# Patient Record
Sex: Female | Born: 1940 | Race: White | Hispanic: No | Marital: Married | Smoking: Never smoker
Health system: Southern US, Community
[De-identification: ages and names within clinical notes are randomized; demographics above are authoritative.]

## PROBLEM LIST (undated history)

## (undated) DIAGNOSIS — IMO0002 Reserved for concepts with insufficient information to code with codable children: Secondary | ICD-10-CM

## (undated) DIAGNOSIS — F419 Anxiety disorder, unspecified: Secondary | ICD-10-CM

## (undated) DIAGNOSIS — Z9221 Personal history of antineoplastic chemotherapy: Secondary | ICD-10-CM

## (undated) DIAGNOSIS — E785 Hyperlipidemia, unspecified: Secondary | ICD-10-CM

## (undated) DIAGNOSIS — R011 Cardiac murmur, unspecified: Secondary | ICD-10-CM

## (undated) DIAGNOSIS — E079 Disorder of thyroid, unspecified: Secondary | ICD-10-CM

## (undated) DIAGNOSIS — Z800 Family history of malignant neoplasm of digestive organs: Secondary | ICD-10-CM

## (undated) DIAGNOSIS — C801 Malignant (primary) neoplasm, unspecified: Secondary | ICD-10-CM

## (undated) DIAGNOSIS — N952 Postmenopausal atrophic vaginitis: Secondary | ICD-10-CM

## (undated) DIAGNOSIS — N390 Urinary tract infection, site not specified: Secondary | ICD-10-CM

## (undated) DIAGNOSIS — M1990 Unspecified osteoarthritis, unspecified site: Secondary | ICD-10-CM

## (undated) DIAGNOSIS — G4730 Sleep apnea, unspecified: Secondary | ICD-10-CM

## (undated) DIAGNOSIS — Z923 Personal history of irradiation: Secondary | ICD-10-CM

## (undated) DIAGNOSIS — I00 Rheumatic fever without heart involvement: Secondary | ICD-10-CM

## (undated) DIAGNOSIS — I10 Essential (primary) hypertension: Secondary | ICD-10-CM

## (undated) DIAGNOSIS — K219 Gastro-esophageal reflux disease without esophagitis: Secondary | ICD-10-CM

## (undated) DIAGNOSIS — Z803 Family history of malignant neoplasm of breast: Secondary | ICD-10-CM

## (undated) DIAGNOSIS — K648 Other hemorrhoids: Secondary | ICD-10-CM

## (undated) DIAGNOSIS — Z87442 Personal history of urinary calculi: Secondary | ICD-10-CM

## (undated) DIAGNOSIS — K5790 Diverticulosis of intestine, part unspecified, without perforation or abscess without bleeding: Secondary | ICD-10-CM

## (undated) DIAGNOSIS — M8580 Other specified disorders of bone density and structure, unspecified site: Secondary | ICD-10-CM

## (undated) DIAGNOSIS — D649 Anemia, unspecified: Secondary | ICD-10-CM

## (undated) DIAGNOSIS — R51 Headache: Secondary | ICD-10-CM

## (undated) DIAGNOSIS — E039 Hypothyroidism, unspecified: Secondary | ICD-10-CM

## (undated) HISTORY — PX: PELVIC LAPAROSCOPY: SHX162

## (undated) HISTORY — PX: OTHER SURGICAL HISTORY: SHX169

## (undated) HISTORY — DX: Diverticulosis of intestine, part unspecified, without perforation or abscess without bleeding: K57.90

## (undated) HISTORY — DX: Urinary tract infection, site not specified: N39.0

## (undated) HISTORY — DX: Gastro-esophageal reflux disease without esophagitis: K21.9

## (undated) HISTORY — PX: APPENDECTOMY: SHX54

## (undated) HISTORY — DX: Essential (primary) hypertension: I10

## (undated) HISTORY — DX: Reserved for concepts with insufficient information to code with codable children: IMO0002

## (undated) HISTORY — DX: Rheumatic fever without heart involvement: I00

## (undated) HISTORY — DX: Anemia, unspecified: D64.9

## (undated) HISTORY — DX: Postmenopausal atrophic vaginitis: N95.2

## (undated) HISTORY — DX: Disorder of thyroid, unspecified: E07.9

## (undated) HISTORY — DX: Headache: R51

## (undated) HISTORY — DX: Family history of malignant neoplasm of digestive organs: Z80.0

## (undated) HISTORY — DX: Other hemorrhoids: K64.8

## (undated) HISTORY — PX: ESOPHAGOGASTRODUODENOSCOPY ENDOSCOPY: SHX5814

## (undated) HISTORY — DX: Family history of malignant neoplasm of breast: Z80.3

## (undated) HISTORY — PX: TONSILLECTOMY: SUR1361

## (undated) HISTORY — DX: Hyperlipidemia, unspecified: E78.5

## (undated) HISTORY — PX: COLONOSCOPY: SHX174

## (undated) HISTORY — DX: Other specified disorders of bone density and structure, unspecified site: M85.80

---

## 1976-04-16 HISTORY — PX: LAPAROSCOPIC ENDOMETRIOSIS FULGURATION: SUR769

## 1999-07-25 ENCOUNTER — Encounter: Payer: Self-pay | Admitting: Emergency Medicine

## 1999-07-25 ENCOUNTER — Emergency Department (HOSPITAL_COMMUNITY): Admission: EM | Admit: 1999-07-25 | Discharge: 1999-07-26 | Payer: Self-pay | Admitting: Emergency Medicine

## 2000-03-14 ENCOUNTER — Other Ambulatory Visit: Admission: RE | Admit: 2000-03-14 | Discharge: 2000-03-14 | Payer: Self-pay | Admitting: *Deleted

## 2000-08-20 ENCOUNTER — Ambulatory Visit (HOSPITAL_COMMUNITY): Admission: RE | Admit: 2000-08-20 | Discharge: 2000-08-20 | Payer: Self-pay | Admitting: Gastroenterology

## 2001-03-17 ENCOUNTER — Other Ambulatory Visit: Admission: RE | Admit: 2001-03-17 | Discharge: 2001-03-17 | Payer: Self-pay | Admitting: *Deleted

## 2003-02-16 ENCOUNTER — Encounter (INDEPENDENT_AMBULATORY_CARE_PROVIDER_SITE_OTHER): Payer: Self-pay

## 2003-02-16 ENCOUNTER — Ambulatory Visit (HOSPITAL_COMMUNITY): Admission: RE | Admit: 2003-02-16 | Discharge: 2003-02-16 | Payer: Self-pay | Admitting: Family Medicine

## 2004-06-17 ENCOUNTER — Emergency Department (HOSPITAL_COMMUNITY): Admission: EM | Admit: 2004-06-17 | Discharge: 2004-06-18 | Payer: Self-pay | Admitting: Emergency Medicine

## 2004-06-21 ENCOUNTER — Encounter: Payer: Self-pay | Admitting: Family Medicine

## 2004-06-21 ENCOUNTER — Inpatient Hospital Stay (HOSPITAL_COMMUNITY): Admission: EM | Admit: 2004-06-21 | Discharge: 2004-06-23 | Payer: Self-pay | Admitting: Emergency Medicine

## 2004-06-21 ENCOUNTER — Ambulatory Visit: Payer: Self-pay | Admitting: Internal Medicine

## 2004-06-22 ENCOUNTER — Encounter: Payer: Self-pay | Admitting: Internal Medicine

## 2004-06-30 ENCOUNTER — Ambulatory Visit: Payer: Self-pay

## 2004-07-10 ENCOUNTER — Ambulatory Visit: Payer: Self-pay | Admitting: Internal Medicine

## 2007-02-05 ENCOUNTER — Encounter: Payer: Self-pay | Admitting: Family Medicine

## 2007-02-05 ENCOUNTER — Encounter: Admission: RE | Admit: 2007-02-05 | Discharge: 2007-02-05 | Payer: Self-pay | Admitting: Family Medicine

## 2007-04-17 LAB — HM COLONOSCOPY: HM Colonoscopy: NORMAL

## 2007-12-09 ENCOUNTER — Other Ambulatory Visit: Admission: RE | Admit: 2007-12-09 | Discharge: 2007-12-09 | Payer: Self-pay | Admitting: Obstetrics and Gynecology

## 2007-12-30 ENCOUNTER — Ambulatory Visit: Payer: Self-pay | Admitting: Obstetrics and Gynecology

## 2008-01-12 ENCOUNTER — Ambulatory Visit: Payer: Self-pay | Admitting: Obstetrics and Gynecology

## 2008-01-15 LAB — HM MAMMOGRAPHY: HM Mammogram: NORMAL

## 2008-01-22 ENCOUNTER — Ambulatory Visit (HOSPITAL_COMMUNITY): Admission: RE | Admit: 2008-01-22 | Discharge: 2008-01-22 | Payer: Self-pay | Admitting: Obstetrics and Gynecology

## 2008-03-18 ENCOUNTER — Encounter: Admission: RE | Admit: 2008-03-18 | Discharge: 2008-03-18 | Payer: Self-pay | Admitting: Obstetrics and Gynecology

## 2008-04-16 HISTORY — PX: ULNAR NERVE REPAIR: SHX2594

## 2008-05-13 ENCOUNTER — Ambulatory Visit (HOSPITAL_BASED_OUTPATIENT_CLINIC_OR_DEPARTMENT_OTHER): Admission: RE | Admit: 2008-05-13 | Discharge: 2008-05-13 | Payer: Self-pay | Admitting: Orthopedic Surgery

## 2008-05-27 ENCOUNTER — Encounter: Payer: Self-pay | Admitting: Family Medicine

## 2008-07-30 ENCOUNTER — Ambulatory Visit: Payer: Self-pay | Admitting: Family Medicine

## 2008-07-30 DIAGNOSIS — G43909 Migraine, unspecified, not intractable, without status migrainosus: Secondary | ICD-10-CM | POA: Insufficient documentation

## 2008-07-30 DIAGNOSIS — R51 Headache: Secondary | ICD-10-CM | POA: Insufficient documentation

## 2008-07-30 DIAGNOSIS — K219 Gastro-esophageal reflux disease without esophagitis: Secondary | ICD-10-CM | POA: Insufficient documentation

## 2008-07-30 DIAGNOSIS — E785 Hyperlipidemia, unspecified: Secondary | ICD-10-CM | POA: Insufficient documentation

## 2008-07-30 DIAGNOSIS — M76899 Other specified enthesopathies of unspecified lower limb, excluding foot: Secondary | ICD-10-CM | POA: Insufficient documentation

## 2008-07-30 DIAGNOSIS — R519 Headache, unspecified: Secondary | ICD-10-CM | POA: Insufficient documentation

## 2008-09-09 ENCOUNTER — Encounter: Payer: Self-pay | Admitting: Family Medicine

## 2008-09-09 DIAGNOSIS — M899 Disorder of bone, unspecified: Secondary | ICD-10-CM | POA: Insufficient documentation

## 2008-09-09 DIAGNOSIS — M949 Disorder of cartilage, unspecified: Secondary | ICD-10-CM

## 2008-09-09 DIAGNOSIS — M4120 Other idiopathic scoliosis, site unspecified: Secondary | ICD-10-CM | POA: Insufficient documentation

## 2009-04-25 ENCOUNTER — Ambulatory Visit: Payer: Self-pay | Admitting: Obstetrics and Gynecology

## 2009-04-25 ENCOUNTER — Other Ambulatory Visit: Admission: RE | Admit: 2009-04-25 | Discharge: 2009-04-25 | Payer: Self-pay | Admitting: Obstetrics and Gynecology

## 2009-05-11 ENCOUNTER — Encounter (HOSPITAL_COMMUNITY): Admission: RE | Admit: 2009-05-11 | Discharge: 2009-07-25 | Payer: Self-pay | Admitting: Obstetrics and Gynecology

## 2009-06-06 ENCOUNTER — Ambulatory Visit: Payer: Self-pay | Admitting: Family Medicine

## 2009-06-06 DIAGNOSIS — R5383 Other fatigue: Secondary | ICD-10-CM

## 2009-06-06 DIAGNOSIS — R030 Elevated blood-pressure reading, without diagnosis of hypertension: Secondary | ICD-10-CM | POA: Insufficient documentation

## 2009-06-06 DIAGNOSIS — E039 Hypothyroidism, unspecified: Secondary | ICD-10-CM | POA: Insufficient documentation

## 2009-06-06 DIAGNOSIS — R5381 Other malaise: Secondary | ICD-10-CM | POA: Insufficient documentation

## 2009-06-06 LAB — CONVERTED CEMR LAB
Cholesterol, target level: 200 mg/dL
HDL goal, serum: 40 mg/dL
LDL Goal: 160 mg/dL

## 2009-06-08 LAB — CONVERTED CEMR LAB
ALT: 22 units/L (ref 0–35)
AST: 27 units/L (ref 0–37)
Albumin: 4.3 g/dL (ref 3.5–5.2)
Alkaline Phosphatase: 71 units/L (ref 39–117)
BUN: 19 mg/dL (ref 6–23)
Basophils Absolute: 0 10*3/uL (ref 0.0–0.1)
Basophils Relative: 0.6 % (ref 0.0–3.0)
Bilirubin, Direct: 0.1 mg/dL (ref 0.0–0.3)
CO2: 31 meq/L (ref 19–32)
Calcium: 9.1 mg/dL (ref 8.4–10.5)
Chloride: 107 meq/L (ref 96–112)
Cholesterol: 237 mg/dL — ABNORMAL HIGH (ref 0–200)
Creatinine, Ser: 1.1 mg/dL (ref 0.4–1.2)
Direct LDL: 119.8 mg/dL
Eosinophils Absolute: 0.3 10*3/uL (ref 0.0–0.7)
Eosinophils Relative: 4.7 % (ref 0.0–5.0)
GFR calc non Af Amer: 52.33 mL/min (ref >60)
Glucose, Bld: 99 mg/dL (ref 70–99)
HCT: 40.8 % (ref 36.0–46.0)
HDL: 65.5 mg/dL (ref >39.00)
Hemoglobin: 13.5 g/dL (ref 12.0–15.0)
Lymphocytes Relative: 32.6 % (ref 12.0–46.0)
Lymphs Abs: 2.2 10*3/uL (ref 0.7–4.0)
MCHC: 33.2 g/dL (ref 30.0–36.0)
MCV: 86 fL (ref 78.0–100.0)
Monocytes Absolute: 0.7 10*3/uL (ref 0.1–1.0)
Monocytes Relative: 10.5 % (ref 3.0–12.0)
Neutro Abs: 3.7 10*3/uL (ref 1.4–7.7)
Neutrophils Relative %: 51.6 % (ref 43.0–77.0)
Platelets: 282 10*3/uL (ref 150.0–400.0)
Potassium: 5 meq/L (ref 3.5–5.1)
RBC: 4.75 M/uL (ref 3.87–5.11)
RDW: 12.9 % (ref 11.5–14.6)
Sodium: 144 meq/L (ref 135–145)
TSH: 0.78 microintl units/mL (ref 0.35–5.50)
Total Bilirubin: 0.7 mg/dL (ref 0.3–1.2)
Total CHOL/HDL Ratio: 4
Total Protein: 7.5 g/dL (ref 6.0–8.3)
Triglycerides: 443 mg/dL — ABNORMAL HIGH (ref 0.0–149.0)
VLDL: 88.6 mg/dL — ABNORMAL HIGH (ref 0.0–40.0)
WBC: 6.9 10*3/uL (ref 4.5–10.5)

## 2009-07-20 ENCOUNTER — Telehealth: Payer: Self-pay | Admitting: Family Medicine

## 2009-08-16 ENCOUNTER — Telehealth: Payer: Self-pay | Admitting: Family Medicine

## 2010-02-17 ENCOUNTER — Telehealth: Payer: Self-pay | Admitting: Family Medicine

## 2010-05-18 NOTE — Progress Notes (Signed)
Summary: fever blisters  Phone Note Call from Patient   Caller: Patient Call For: Evelena Peat MD Summary of Call: Pt is having fever blisters and would like [facility]. Initial call taken by: Lynann Beaver CMA,  July 20, 2009 4:30 PM  Follow-up for Phone Call        Valtrex 1 gm 2 tablets at onset of fever blister and repeat 2 tablets 12 hours later, disp #30 with one refill. Follow-up by: Evelena Peat MD,  July 20, 2009 4:37 PM    New/Updated Medications: VALTREX 1 GM TABS (VALACYCLOVIR HCL) 2 tabs at onset of fever blisters and then repeat 12 hours later Prescriptions: VALTREX 1 GM TABS (VALACYCLOVIR HCL) 2 tabs at onset of fever blisters and then repeat 12 hours later  #20 x 1   Entered by:   Lynann Beaver CMA   Authorized by:   Evelena Peat MD   Signed by:   Lynann Beaver CMA on 07/20/2009   Method used:   Electronically to        [facility] ([facility])       [address]       [place], [place]  [address]       Ph: [phone]       Fax: [phone]   RxID:   [id number]

## 2010-05-18 NOTE — Progress Notes (Signed)
Summary: dental premed?  Phone Note Call from Patient Call back at [facility] Phone [phone] Call back at [facility]   Summary of Call: Had rheumatic fever as a child.  Told to take something like penicillin before dental work.  For dental procedure.  Do I need premed?  Have some amox 4  1hr before on hand, from another physician,  if needed.  Please advise. Initial call taken by: Rudy Jew, RN,  Aug 16, 2009 11:12 AM  Follow-up for Phone Call        Current AHA guidelines do not rec routine dental prophylaxis in this case. Follow-up by: Evelena Peat MD,  Aug 16, 2009 1:13 PM  Additional Follow-up for Phone Call Additional follow up Details #1::        Phone Call Completed Additional Follow-up by: Rudy Jew, RN,  Aug 16, 2009 2:06 PM

## 2010-05-18 NOTE — Progress Notes (Signed)
Summary: RX for nerves  Phone Note Call from Patient Call back at Home Phone [phone]   Caller: Patient Call For: Evelena Peat MD Summary of Call: Leaving for [place], and wants to know if Dr. Caryl Never would give her something for the flight over and back.   Initial call taken by: Lynann Beaver CMA AAMA,  February 17, 2010 11:26 AM  Follow-up for Phone Call        Yes.  Lorazepam 0.5 mg 1-2 by mouth q6 hours as needed anxiety.  Disp #15 with no refill.  Take one hour prior to flight. Follow-up by: Evelena Peat MD,  February 17, 2010 12:53 PM  Additional Follow-up for Phone Call Additional follow up Details #1::        LMTCB Additional Follow-up by: [facility] CMA AAMA,  February 17, 2010 1:10 PM    New/Updated Medications: LORAZEPAM 0.5 MG TABS (LORAZEPAM) 1-2 q 6 hours as needed for anxiety Prescriptions: LORAZEPAM 0.5 MG TABS (LORAZEPAM) 1-2 q 6 hours as needed for anxiety  #15 x 0   Entered by:   Lynann Beaver CMA AAMA   Authorized by:   Stacie Glaze MD   Signed by:   Lynann Beaver CMA AAMA on 02/17/2010   Method used:   Telephoned to ...       [facility]       [address]       [place], [place]  [address]       Ph: [phone]       Fax: [phone]   RxID:   [id number]

## 2010-05-18 NOTE — Assessment & Plan Note (Signed)
Summary: med check/refill/cjr   Vital Signs:  Patient profile:   70 year old female Weight:      138 pounds BMI:     26.17 Temp:     98.4 degrees F oral BP sitting:   120 / 80  (left arm) Cuff size:   regular  Vitals Entered By: Sid Falcon LPN (June 06, 2009 11:10 AM)  Nutrition Counseling: Patient's BMI is greater than 25 and therefore counseled on weight management options. CC: med check, refill, Lipid Management   History of Present Illness: Follow up medical problems.  Hypothyroidism treated with levothyroxine 50 micrograms daily. Compliant with therapy. Has had some recent fatigue issues. Needs lab work.  Hyperlipidemia treated with simvastatin. No myalgia or other side effect. Compliant with therapy. No history of CAD.  History elevated blood pressure treated with hydrochlorothiazide. Blood pressure stable. No orthostatic symptoms.  Assessment progressive fatigue over recent months. Generally sleeping well. Good appetite. Weight stable. Otherwise feels well  Lipid Management History:      Positive NCEP/ATP III risk factors include female age 26 years old or older.  Negative NCEP/ATP III risk factors include non-diabetic, no family history for ischemic heart disease, non-tobacco-user status, no ASHD (atherosclerotic heart disease), no prior stroke/TIA, no peripheral vascular disease, and no history of aortic aneurysm.     Allergies: 1)  ! Macrobid (Nitrofurantoin Monohyd Macro) 2)  ! Bactrim Ds (Sulfamethoxazole-Trimethoprim)  Past History:  Past Medical History: Last updated: 09/09/2008 Anemia GERD Headache/migraines Ulcers Kidney stones rheumatic fever age 27 thyroid problem UTI ulnar neuropathy Osteopenia Hyperlipidemia  Past Surgical History: Last updated: 07/30/2008 Endometriosis 1978 Ulna nerve 2010  Family History: Last updated: 09/09/2008 Family History of Arthritis Colon cancer Breast cancer mother, grandmother heart  disease--father Family History Hypertension--parent  Social History: Last updated: 09/09/2008 Retired Runner, broadcasting/film/video Married Never Smoked Alcohol use-no  Risk Factors: Smoking Status: never (07/30/2008) PMH-FH-SH reviewed for relevance  Review of Systems  The patient denies anorexia, fever, weight loss, weight gain, chest pain, syncope, dyspnea on exertion, peripheral edema, prolonged cough, headaches, hemoptysis, abdominal pain, melena, hematochezia, severe indigestion/heartburn, and suspicious skin lesions.    Physical Exam  General:  Well-developed,well-nourished,in no acute distress; alert,appropriate and cooperative throughout examination Ears:  External ear exam shows no significant lesions or deformities.  Otoscopic examination reveals clear canals, tympanic membranes are intact bilaterally without bulging, retraction, inflammation or discharge. Hearing is grossly normal bilaterally. Mouth:  Oral mucosa and oropharynx without lesions or exudates.  Teeth in good repair. Neck:  No deformities, masses, or tenderness noted. Lungs:  Normal respiratory effort, chest expands symmetrically. Lungs are clear to auscultation, no crackles or wheezes. Heart:  Normal rate and regular rhythm. S1 and S2 normal without gallop, murmur, click, rub or other extra sounds. Extremities:  No clubbing, cyanosis, edema, or deformity noted with normal full range of motion of all joints.     Impression & Recommendations:  Problem # 1:  HYPOTHYROIDISM (ICD-244.9)  Her updated medication list for this problem includes:    Synthroid 50 Mcg Tabs (Levothyroxine sodium) ..... Once daily  Orders: TLB-TSH (Thyroid Stimulating Hormone) (84443-TSH) Venipuncture (24401)  Problem # 2:  HYPERLIPIDEMIA (ICD-272.4)  Her updated medication list for this problem includes:    Simvastatin 20 Mg Tabs (Simvastatin) ..... Once daily  Orders: TLB-Lipid Panel (80061-LIPID) TLB-Hepatic/Liver Function Pnl  (80076-HEPATIC) Venipuncture (02725)  Problem # 3:  ELEVATED BLOOD PRESSURE (ICD-796.2)  Her updated medication list for this problem includes:    Atenolol 50 Mg Tabs (Atenolol) [facility]  Once daily    Hydrochlorothiazide 25 Mg Tabs (Hydrochlorothiazide) ..... Once daily  Orders: TLB-BMP (Basic Metabolic Panel-BMET) (80048-METABOL) Venipuncture (78295)  Problem # 4:  FATIGUE (ICD-780.79)  Orders: TLB-CBC Platelet - w/Differential (85025-CBCD) TLB-BMP (Basic Metabolic Panel-BMET) (80048-METABOL) Venipuncture (62130)  Complete Medication List: 1)  Atenolol 50 Mg Tabs (Atenolol) .... Once daily 2)  Zolpidem Tartrate 10 Mg Tabs (Zolpidem tartrate) .... Once daily 3)  Synthroid 50 Mcg Tabs (Levothyroxine sodium) .... Once daily 4)  Effexor Xr 150 Mg Xr24h-cap (Venlafaxine hcl) .... Once daily 5)  Simvastatin 20 Mg Tabs (Simvastatin) .... Once daily 6)  Hydrochlorothiazide 25 Mg Tabs (Hydrochlorothiazide) .... Once daily 7)  Vitamin D3 2000 Unit Caps (Cholecalciferol) .... One daily 8)  Reclast 5 Mg/110ml Soln (Zoledronic acid) .... Once yearly  Lipid Assessment/Plan:      Based on NCEP/ATP III, the patient's risk factor category is "0-1 risk factors".  The patient's lipid goals are as follows: Total cholesterol goal is 200; LDL cholesterol goal is 160; HDL cholesterol goal is 40; Triglyceride goal is 150.    Patient Instructions: 1)  Please schedule a follow-up appointment in 1 year.  Prescriptions: HYDROCHLOROTHIAZIDE 25 MG TABS (HYDROCHLOROTHIAZIDE) once daily  #30 x 11   Entered and Authorized by:   Evelena Peat MD   Signed by:   Evelena Peat MD on 06/06/2009   Method used:   Electronically to        [facility]       [address]       [place], [place]  [address]       Ph: [phone]       Fax: [phone]   RxID:   [id number] SIMVASTATIN 20 MG TABS (SIMVASTATIN) once daily  #30 x 11   Entered and Authorized by:    Evelena Peat MD   Signed by:   Evelena Peat MD on 06/06/2009   Method used:   Electronically to        [facility]       [address]       [place], [place]  [address]       Ph: [phone]       Fax: [phone]   RxID:   [id number] SYNTHROID 50 MCG TABS (LEVOTHYROXINE SODIUM) once daily  #30 x 11   Entered and Authorized by:   Evelena Peat MD   Signed by:   Evelena Peat MD on 06/06/2009   Method used:   Electronically to        [facility])       [address]       [place], [place]  [address]       Ph: [phone]       Fax: [phone]   RxID:   [id number]

## 2010-05-18 NOTE — Assessment & Plan Note (Signed)
Summary: fup on meds-leg pain//ccm   Vital Signs:  Patient profile:   70 year old female Height:      61 inches Weight:      147 pounds BMI:     27.88 Temp:     98.2 degrees F oral BP sitting:   140 / 70  (left arm) Cuff size:   regular  Vitals Entered By: Sid Falcon LPN (July 30, 2008 11:28 AM) CC: Previous [place] pt, to establish, left leg/groin pain, F/U on meds   History of Present Illness: Patient is a pleasant 70 year old female seen to establish care. She is seen today with chief complaint of left anterior thigh and hip pain in the past 10 days or so. No known injury. She describes a pain which was initially moderate in severity and worse with walking but not at rest. This is a achy pain confined to the anterior hip area without radiation. She did Tylenol without relief. She states her pain is about 50% better today. No edema. Denies any fever or chills. No associated back pain. She has no history of osteoporosis and is on reclast.  Other medical problems include history of GERD, peptic ulcer disease, past history of anemia, migraine headaches, kidney stones, hypothyroidism, and hyperlipidemia. We have no old records at this time. She had left ulnar neuropathy diagnosed in the past year and had recent surgery for that and is recovering.  Preventive Screening-Counseling & Management     Smoking Status: never  Past History:  Family History:    Family History of Arthritis    Colon cancer    Breast cancer mother, grandmother    heart disease    Family History Hypertension     (07/30/2008)  Social History:    Retired Runner, broadcasting/film/video    Married     (07/30/2008)  Risk Factors:    Alcohol Use: N/A    >5 drinks/d w/in last 3 months: N/A    Caffeine Use: N/A    Diet: N/A    Exercise: N/A  Risk Factors:    Smoking Status: N/A    Packs/Day: N/A    Cigars/wk: N/A    Pipe Use/wk: N/A    Cans of tobacco/wk: N/A    Passive Smoke Exposure: N/A  Past Medical History:  Anemia    GERD    Headache/migraines    Ulcers    Kidney stones    rheumatic fever age 39    thyroid problem    UTI    ulnar neuropathy  Past Surgical History:    Endometriosis 1978    Ulna nerve 2010  Family History:    Family History of Arthritis    Colon cancer    Breast cancer mother, grandmother    heart disease    Family History Hypertension  Social History:    Retired Runner, broadcasting/film/video    Married    Never Smoked    Smoking Status:  never  Review of Systems  The patient denies anorexia, fever, weight loss, peripheral edema, abdominal pain, muscle weakness, and difficulty walking.    Physical Exam  General:  patient's alert healthy-appearing Neck:  No deformities, masses, or tenderness noted. Lungs:  Normal respiratory effort, chest expands symmetrically. Lungs are clear to auscultation, no crackles or wheezes. Heart:  regular rhythm and rate Abdomen:  soft and nontender. No masses. Msk:  No deformity or scoliosis noted of thoracic or lumbar spine.   Pulses:  distal foot pulses are normal bilaterally. Extremities:  full range of  motion right and left hip without difficulty. No reproducible pain. No edema in lower extremities.chest tenderness over the attachment sartorius muscle in the left hip to the anterior superior iliac spine. No lateral hip tenderness. Straight leg raises are negative. Neurologic:  strength full lower extremities. DP reflexes 2+ in lower extremities. 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 Doubt hip joint pathology. This is already 50% improved compared to when this started. Try icing 2-3 times daily for 15-20 minutes and didn't touch of her pains are not further improved in next 2-3 weeks.  Problem # 2:  GERD (ICD-530.81) stable  Problem # 3:  HYPERLIPIDEMIA (ICD-272.4) Send for old records. No  concerning symptoms for side effects to medication. Her updated medication list for this problem includes:    Simvastatin 20 Mg Tabs (Simvastatin) ..... Once daily  Problem # 4:  MIGRAINE HEADACHE (ICD-346.90) Followed by neurologist. Her updated medication list for this problem includes:    Atenolol 50 Mg Tabs (Atenolol) ..... Once daily  Complete Medication List: 1)  Atenolol 50 Mg Tabs (Atenolol) .... Once daily 2)  Zolpidem Tartrate 10 Mg Tabs (Zolpidem tartrate) .... Once daily 3)  Synthroid 50 Mcg Tabs (Levothyroxine sodium) .... Once daily 4)  Effexor Xr 150 Mg Xr24h-cap (Venlafaxine hcl) .... Once daily 5)  Simvastatin 20 Mg Tabs (Simvastatin) .... Once daily 6)  Hydrochlorothiazide 25 Mg Tabs (Hydrochlorothiazide) .... Once daily 7)  [facility] High Potency Vitamin D 1000 Unit Tabs (Cholecalciferol) .... Once daily 8)  Reclast 5 Mg/187ml Soln (Zoledronic acid) .... Once yearly  Patient Instructions: 1)  Consider icing to the area of tenderness to 3 times daily for 15-20 minutes. Also continue gentle stretches as instructed. Follow up if your pain persists in the next 3-4 weeks.      Preventive Care Screening  Mammogram:    Date:  01/15/2008    Results:  normal   Colonoscopy:    Date:  04/17/2007    Results:  normal

## 2010-07-03 ENCOUNTER — Other Ambulatory Visit: Payer: Self-pay | Admitting: Family Medicine

## 2010-07-31 LAB — BASIC METABOLIC PANEL
BUN: 12 mg/dL (ref 6–23)
CO2: 24 mEq/L (ref 19–32)
Calcium: 8.7 mg/dL (ref 8.4–10.5)
Chloride: 112 mEq/L (ref 96–112)
Creatinine, Ser: 0.81 mg/dL (ref 0.4–1.2)
GFR calc Af Amer: >60 mL/min (ref >60)
GFR calc non Af Amer: >60 mL/min (ref >60)
Glucose, Bld: 93 mg/dL (ref 70–99)
Potassium: 3.9 mEq/L (ref 3.5–5.1)
Sodium: 141 mEq/L (ref 135–145)

## 2010-07-31 LAB — POCT HEMOGLOBIN-HEMACUE: Hemoglobin: 13.9 g/dL (ref 12.0–15.0)

## 2010-08-21 ENCOUNTER — Other Ambulatory Visit: Payer: Self-pay | Admitting: Family Medicine

## 2010-08-29 NOTE — Op Note (Signed)
[name], [name]              ACCOUNT NO.:  [id number]   MEDICAL RECORD NO.:  [id number]          PATIENT TYPE:  AMB   LOCATION:  [facility]SC                          FACILITY:  [facility]   PHYSICIAN:  Katy Fitch. Sypher, M.D. 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   POSTOPERATIVE DIAGNOSIS:  Compression of left ulnar nerve due to arcuate  ligament and Osborne's band, left cubital tunnel.   OPERATIONS:  In situ decompression of what appeared to be stable left  ulnar nerve.   OPERATIONS:  Katy Fitch. Sypher, MD   ASSISTANT:  [name], [name]   ANESTHESIA:  General by LMA.   SUPERVISING ANESTHESIOLOGIST:  Bedelia Person, MD   INDICATIONS:  [name] is a 70 year old woman referred for  evaluation and management of a chronically numb left ring and small  finger as well as weakness of pinch and grasp.  She has had a several-  month history of diminished sensibility, difficulty crossing her  fingers, and weak pinch.  Clinical examination revealed a positive  Froment sign with attempted pinch, a positive Wartenberg sign, and  inability to cross her fingers.  She had diminished sensibility on the  dorsal surface of her small finger metacarpal and loss of sensibility in  the proper ulnar distribution of her left hand.  She had a positive  elbow hyperflexion test and reproducing increased numbness in her hand  and a positive Tinel sign over the ulnar nerve at the level of the  arcuate ligament.  She did not show distal signs of compression.   Dr. [name] performed detailed electrodiagnostic studies localizing a  severe drop in conduction velocity and amplitude across the elbow.  He  concluded that she had a severe ulnar neuropathy localized the  elbow.   After informed consent, [name] was brought to the operating room  at this time.   We advised her preoperatively that we would decompress the nerve in the  region of the cubital tunnel, explore the cubital tunnel, and make  appropriate adjustments in the anatomy to protect the nerve.  If the  nerve was found to be unstable with a shallow groove and a tendency for  chronic subluxation with elbow flexion, we would proceed with an  anterior subcutaneous transposition.   After informed consent, she is brought to the operating room at this  time.  Preoperatively. she and her husband were advised that it can  require up to 18 months to see if severe ulnar nerve stabilize and  recover.  Questions were invited and answered in detail.   PROCEDURE:  [name] was brought to the operating room and placed  in the supine position upon the operating table.   Following the induction of general anesthesia by LMA technique, the left  arm was prepped with Betadine soap and solution, sterilely draped.  Anticipating a short procedure, prophylactic antibiotics were not  provided.  Following exsanguination of the left arm with an Esmarch  bandage, arterial tourniquet on proximal brachium was inflated to 220  mmHg.  Procedure commenced with a 2-cm incision directly over the path  of the ulnar nerve posterior to the epicondyle.  Subcutaneous tissues  were carefully divided taking care to gently dissect and protect the  posterior branch of the medial antebrachial cutaneous nerve.   The ulnar nerve was identified above the epicondyle and decompressed a  distance of 6 cm deep to the brachial fascia.  Care was taken to  preserve the epineurial vessels.  The arcuate ligament was quite tight  over the nerve and there was a band of fascia compressing the nerve at  the entrance of the cubital tunnel.  The fascia at the head of flexor  carpi ulnaris was released over a distance of 5 cm followed  by teasing  the muscle fibers.  The nerve appeared to be unimpeded deep to the  flexor carpi ulnaris.  The nerve was neurolysed carefully through the  cubital tunnel, releasing all fascial bands.   A Glorious Peach was used to roll the nerve forward.  No masses of the floor of  the canal were noted.   The elbow was ranged from 0-145 degrees' flexion.  The nerve remained in  the cubital groove without significant subluxation.   Care was taken to leave a small wall of the arcuate ligament anteriorly  to prevent roll.   The medial head of the triceps had an accessory muscle bundle that did  cover the nerve with elbow flexion beyond 100 degrees.  This did cause  some extrusion.  The accessory muscle was then resected with scissors  and electrocautery down to the native triceps tendon.  This unroofed the  nerve.  The tourniquet was released with immediate cap refill to the  fingers, thumb and good flow in the epineurial vessels.   The wound was repaired with subcutaneous suture of 4-0 Vicryl and  intradermal 3-0 Prolene with Steri-Strips.  Lidocaine 2% was infiltrated  for postop analgesia.  There were no apparent complications.      Katy Fitch Sypher, M.D.  Electronically Signed     RVS/MEDQ  D:  05/13/2008  T:  05/14/2008  Job:  [id number]   cc:   Teena Irani. Arlyce Dice, M.D.  Evelena Peat, M.D.

## 2010-09-01 NOTE — Discharge Summary (Signed)
NAMEEDRA, RICCARDI              ACCOUNT NO.:  [id number]   MEDICAL RECORD NO.:  [id number]          PATIENT TYPE:  INP   LOCATION:  4703                         FACILITY:  [facility]   PHYSICIAN:  Isidor Holts, M.D.  DATE OF BIRTH:  [birth year]   DATE OF ADMISSION:  06/21/2004  DATE OF DISCHARGE:  06/23/2004                                 DISCHARGE SUMMARY   PHYSICIANS:  Primary care physician:  Maryelizabeth Rowan, M.D.  Cardiologist:  Doylene Canning. Ladona Ridgel, M.D.   DISCHARGE DIAGNOSES:  1.  Palpitations.  Normal echocardiogram, 12-lead electrocardiogram, and      thyroid profile  2.  Microcytic anemia status post transfusion of 2 units packed red blood      cells.  3.  History of anxiety/Attention deficit disorder.  4.  Hypertension.  5.  History of hypothyroidism.  6.  Urinary tract infection.   DISCHARGE MEDICATIONS:  1.  Continue all pre-admission medications with the exception of Adderall.  2.  Nitrofurantoin 100 mg p.o. four times a day for 1 week, per discussion      with Dr. Duanne Guess.   PROCEDURES:  1.  Chest x-ray dated June 21, 2004.  This showed scoliosis but no active      disease, heart and mediastinum normal.  2.  2D Echocardiogram dated June 22, 2004.  This showed overall normal left      ventricular systolic function.  Left ventricular ejection fraction was      55% to 65%.  There was a trileaflet aortic valve and trivial mitral      regurgitation, otherwise no other valvular abnormalities.   CONSULTATIONS:  Doylene Canning. Ladona Ridgel, M.D., cardiologist, [place].   ADMISSION HISTORY:  As in H&P dated June 21, 2004. However, in brief, this  is a 70 year old Caucasian female who has been having dyspnea on exertion  and fatigue for approximately six months.  She presents on this occasion,  with palpitations not associated with chest pain, nausea, ordiaphoresis.  She went to her MD's office where an EKG was done which showed subtle ST  changes in the V3 to V6, compared to previous  electrocardiograms.  She was  referred to the emergency department for further evaluation.  It appears the  patient has a history of ADD and is currently on Adderall.  She also has a  history of chronic headaches and utilizes Excedrin and ibuprofen for this.  The patient was admitted for further evaluation, investigation, and  management.   CLINICAL COURSE:  #1.  PALPITATIONS:  The patient has a history of anxiety and hypothyroidism.  Thyroid profile was found to be within normal limits.  It was felt that the  patient's medications, particularly Adderall, could be contributory. This  was, therefore, held.  Review of EKGs showed PVCs and also there was ST-T  wave abnormality.  Serial cardiac enzymes were within normal limits.  The  patient had no further palpitations during course of hospital stay, on  telemetry monitoring.  With a view  to ruling out possibility of coronary  ischemia, Cardiology consultation was called, which was kindly provided by  Dr.  Lewayne Bunting.  He evaluated the patient and recommended that stress  Cardiolite be carried out, possibly on an outpatient basis.  Additionally he  recommended, should patient have recurrence of palpitations, she may benefit  from a Holter monitor.  This may be arranged by patient's primary MD.  It is  noted that patient`s echocardiogram was normal.   #2.  ANEMIA:  The patient presented with approximately six-month history of  fatigue and shortness of breath with exertion.  Laboratory data showed  hemoglobin 9.7, MCV of 72.6. ie, Microcytic anemia, due to iron deficiency.  The patient was managed with transfusion of 2 units packed red blood cells.  As of June 23, 2004, hemoglobin was 13.4.  The patient will merit GI  workup, and we expect this to be arranged by her primary MD.   #3.  HISTORY OF HYPERTENSION:  The patient remained normotensive during the  course of her hospital stay.   #4.  HYPOTHYROIDISM:  Managed with replacement  treatment.  Thyroid profile  was within normal limits.   #5.  HISTORY OF ANXIETY AND ATTENTION DEFICIT DISORDER:  The patient's mood  remained stable during course of hospital stay.  As previously mentioned, it  is felt that Adderall may have been contributary to patient's  symptomatology.  This has been held, until further review of patient's ADD  medication by primary MD on followup.   #6.  URINARY TRACT INFECTION:  Just before patient was discharged on June 23, 2004, I received a call from her primary MD,  Dr. Duanne Guess, who informed me  that patient's urinalysis done a couple of days ago had grown E. coli  sensitive to nitrofurantoin and resistant to ciprofloxacin.  Consensus was  that patient be treated with a one-week course of Nitrofurantoin 100 mg p.o.  four times a day.  A prescription for this was, therefore, handed to patient  with appropriate explanation.   DISPOSITION:  The patient is discharged in satisfactory condition on June 23, 2004.   PAIN MANAGEMENT:  Not applicable.   ACTIVITY:  As tolerated.  The patient may return to work on July 01, 2004,  unless deemed otherwise by cardiology.   DIET:  Healthy heart diet.   WOUND CARE:  Not applicable.   FOLLOW UP:  The patient is to follow up with cardiology; i.e., Dr. Ladona Ridgel,  at [facility], telephone number [phone] on Friday, June 30, 2004,  at 11:45 a.m. for exercise stress test.  She is subsequently to see Dr.  Ladona Ridgel in his office on July 10, 2004, at 2:45 p.m.  This has been  communicated to patient.  In addition, the patient is to follow up with her  primary care physician, Dr. Maryelizabeth Rowan, in one to two weeks.  She is to  call to  establish an appointment.  It is expected that her primary MD will arrange a  GI consultation with regards to microcytic anemia.  Primary care MD will  also review Adderall use. This has been communicated to patient, and she has verbalized understanding.       CO/MEDQ  D:  06/26/2004  T:  06/26/2004  Job:  [id number]   cc:   Maryelizabeth Rowan, M.D.  Fax: [phone]   Doylene Canning. Ladona Ridgel, M.D.

## 2010-09-01 NOTE — Consult Note (Signed)
[name], [name]              ACCOUNT NO.:  [id number]   MEDICAL RECORD NO.:  [id number]          PATIENT TYPE:  INP   LOCATION:  4703                         FACILITY:  [facility]   PHYSICIAN:  Doylene Canning. Ladona Ridgel, M.D.  DATE OF BIRTH:  Jan 02, 1941   DATE OF CONSULTATION:  06/22/2004  DATE OF DISCHARGE:                                   CONSULTATION   REQUESTING PHYSICIAN:  Dr. Robb Matar   INDICATION FOR CONSULTATION:  Evaluation of palpitations and shortness of  breath.   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  She states that she has  recently recovered from passing a renal stone.  She also has a history of  hypothyroidism and is on Synthroid.  The patient was in her usual state of  health until yesterday.  She notes that she had previously passed a kidney  stone one day before.  She denied fever or chills.  She developed prolonged  palpitations.  These were described as intermittent skipping sensations.  There is very little in the way of chest discomfort associated with these  but she did have dyspnea.  There is no nausea, vomiting, diarrhea, or  constipation.  She denies polyuria or polydipsia, heat or cold intolerance.  She presented to the emergency room for additional evaluation.   PAST MEDICAL HISTORY:  1.  Recent diagnosis of anemia.  This is with low MCV.  2.  She has a history of a goiter in the past.  3.  She has a history of scoliosis.   FAMILY HISTORY:  Notable for both parents having hypertension.  Her mother  had breast cancer.  Her father had coronary disease and a stroke.   SOCIAL HISTORY:  The patient denies tobacco or alcohol abuse.  She is a  Engineer, site.   REVIEW OF SYSTEMS:  As noted in the HPI.  She denies syncope.  She denies  nausea, vomiting, diarrhea, or constipation.  She recently noticed  passing a  urinary stone.  She denies heat or cold intolerance.  The rest of her review  of systems was negative.   PHYSICAL EXAMINATION:  GENERAL:  She is a pleasant, well-appearing 64-year-  old woman in no distress.  VITAL SIGNS:  Blood pressure 115/75, pulse 78 and regular, respirations 18,  temperature 98.  HEENT:  Normocephalic, atraumatic.  Pupils equal and round.  Oropharynx was  moist.  Sclerae were anicteric.  NECK:  No jugular venous distention.  There was no obvious thyromegaly.  The  trachea was midline.  CARDIOVASCULAR:  Regular rate and rhythm with normal S1 and S2.  There were  no murmurs, rubs, or gallops.  LUNGS:  Clear bilaterally to auscultation.  ABDOMEN:  Soft and nontender.  There was no organomegaly.  EXTREMITIES:  No clubbing, cyanosis, edema.  Pulses were 2+ and symmetric.  NEUROLOGIC:  Alert and oriented x3 with cranial nerves II-XII intact.  Conjunctivae was 5/5 and symmetric.   EKG demonstrates sinus rhythm with  nonspecific ST-T wave abnormality.   IMPRESSION:  1.  Palpitations.  2.  Shortness of breath.  3.  Anemia.  4.  Hypothyroidism.  5.  Status post passage of a renal stone.   DISCUSSION:  The patient's symptoms and EKGs are suggestive of PACs,  although she does have very subtle ST-T wave abnormality along with dyspnea.  I would recommend serial cardiac enzymes and an exercise perfusion stress  test.  This would be appropriate either as an inpatient or as an outpatient  if her cardiac enzymes are negative.  If palpitations were to occur then an  outpatient 30 day cardiac monitor would be warranted to rule out occult  atrial fibrillation.      GWT/MEDQ  D:  06/22/2004  T:  06/22/2004  Job:  [id number]   cc:   Teena Irani. Arlyce Dice, M.D.  [id number]  [place]  [place] [address]  Fax: [phone]

## 2010-09-01 NOTE — H&P (Signed)
[name], [name]              ACCOUNT NO.:  [id number]   MEDICAL RECORD NO.:  [id number]          PATIENT TYPE:  INP   LOCATION:  1823                         FACILITY:  [facility]   PHYSICIAN:  Jonna L. Robb Matar, M.D.DATE OF BIRTH:  Aug 01, 1940   DATE OF ADMISSION:  06/21/2004  DATE OF DISCHARGE:                                HISTORY & PHYSICAL   PRIMARY CARE PHYSICIAN:  [name], M.D. at [facility].   CHIEF COMPLAINT:  (History from the patient's husband). Irregular heart  beat.   HISTORY OF PRESENT ILLNESS:  This 70 year old white female has been having  dyspnea on exertion and fatigue probably for about six months. She gets  daily headaches and has been taking usually an Excedrin in the morning and  an Excedrin or an ibuprofen in the afternoon. She has been noticing that her  stomach is bothering her a lot.   Today, around 1 o'clock in the afternoon, she noticed that she was getting a  lot of palpitations and skipped beats, not that her heart was really going  fast and that she was getting a lot of skipped beats or extra beats. At  first, she did not say anything about it. Then she told her husband and they  brought her to the doctor's office where she was found to have subtle ST  depression in V3 to V6 compared to a previous cardiogram. She denies chest  pain, nausea, vomiting, diaphoresis   PAST MEDICAL HISTORY:  1.  Goiter.  2.  Hypothyroidism.  3.  Nephrolithiasis. She just passed a kidney stone yesterday and was told      at that time she had a lot of blood in her urine, although she had not      noticed any.  4.  Hypotension.  5.  Anxiety.  6.  ADD.   FAMILY HISTORY:  Coronary artery disease, CVA, hypertension, breast cancer,  hypercholesterolemia.   SOCIAL HISTORY:  Nonsmoker and nondrinker. Married.   REVIEW OF SYMPTOMS:  Some mild generalized weakness. No visual problems. No  ears, nose, or throat trouble. No intrinsic lung  disease. No previous  history of heart disease. No hematemesis or melena. No history of heartburn  or liver disease. No breast masses. No diabetes. No history of B12 shots.  She was not aware that she was anemic. She had been menopausal for over 10  years. No skin or arthritis problems. Other review of systems were negative.   PHYSICAL EXAMINATION:  VITAL SIGNS:  Temperature 98.6, pulse 83,  respirations 22, blood pressure 150/63. Saturating 99%.  GENERAL:  A well-developed pale white female, slightly anxious. Conjunctivae  are pale. Lids are normal. Pupils are reactive to light. EOMs are full. She  has normal hearing, mucosa and pharynx.  NECK:  Very slight goiter. The left side is a little more prominent than the  right. No carotid bruits or jugular venous distention.  LUNGS:  Respiratory effort was normal. Clear to A&P without wheezing, rales,  rhonchi, or dullness.  HEART:  Regular rate and rhythm. Normal S1 and S2 without murmurs, gallops,  or rubs. There was a rare PAC. There were no bruits.  EXTREMITIES:  No clubbing, cyanosis, or edema.  BREASTS:  No masses, tenderness, or discharge.  ABDOMEN:  Slight epigastric tenderness.  Normal bowel sounds. No  hepatosplenomegaly or hernias.  GENITALIA:  External genitalia is normal. There is no adenopathy.  NEUROLOGICAL:  Muscle strength is 5/5 with full in all four extremities.  Cranial nerves are intact. DTRs are 2+. Toes go down. Alert and oriented.  Normal memory and judgment. Slightly anxious.  SKIN:  No rashes, lesions, nodules, induration.   LABORATORY DATA:  EKG showed an occasional ATC and very slight ST flattening  in V4 through V6 that was not present on a previous cardiogram.   White count is 8.0. Hemoglobin is only 9.7. Potassium 3.2. BUN 5, creatinine  1.0. Liver function tests, magnesium, and cardiac enzymes are negative.  Thyroid is pending as is anemia workup.   IMPRESSION:  1.  Probable angina: Certainly the anemia  could be bringing out a subtle      coronary artery disease or it could just be the response to the anemia.      She will need to get a Cardiolite stress test and I have contacted      [facility].  2.  Anemia:  I will do a workup but the most likely cause is an nonsteroidal      anti-inflammatory gastritis or ulcer. I will put her on proton pump      inhibitors, antiacids, check stool for occult blood, and once she is      cleared from a cardiac standpoint we should contact gastroenterology to      look for sources of gastrointestinal bleeding.  3.  Hypokalemia: This will be replaced.  4.  Hypothyroidism.  5.  Attention deficit disorder.      JLB/MEDQ  D:  06/21/2004  T:  06/21/2004  Job:  [id number]

## 2010-10-10 ENCOUNTER — Telehealth: Payer: Self-pay | Admitting: Family Medicine

## 2010-10-10 NOTE — Telephone Encounter (Signed)
Pt states she called and left message earlier. She wanted to add that her pharmacy is the [facility] on [address].

## 2010-10-10 NOTE — Telephone Encounter (Signed)
Pharmacy added.

## 2010-10-11 ENCOUNTER — Telehealth: Payer: Self-pay | Admitting: *Deleted

## 2010-10-11 ENCOUNTER — Ambulatory Visit (INDEPENDENT_AMBULATORY_CARE_PROVIDER_SITE_OTHER): Payer: Self-pay | Admitting: Family Medicine

## 2010-10-11 ENCOUNTER — Encounter: Payer: Self-pay | Admitting: Family Medicine

## 2010-10-11 DIAGNOSIS — E785 Hyperlipidemia, unspecified: Secondary | ICD-10-CM

## 2010-10-11 DIAGNOSIS — E039 Hypothyroidism, unspecified: Secondary | ICD-10-CM

## 2010-10-11 DIAGNOSIS — R51 Headache: Secondary | ICD-10-CM

## 2010-10-11 DIAGNOSIS — R030 Elevated blood-pressure reading, without diagnosis of hypertension: Secondary | ICD-10-CM

## 2010-10-11 MED ORDER — ATENOLOL 50 MG PO TABS: 50.0000 mg | ORAL_TABLET | Freq: Every day | ORAL | Status: DC

## 2010-10-11 MED ORDER — VENLAFAXINE HCL ER 150 MG PO CP24: 150.0000 mg | ORAL_CAPSULE | Freq: Every day | ORAL | Status: DC

## 2010-10-11 MED ORDER — HYDROCHLOROTHIAZIDE 25 MG PO TABS: 25.0000 mg | ORAL_TABLET | Freq: Every day | ORAL | Status: DC

## 2010-10-11 MED ORDER — SIMVASTATIN 20 MG PO TABS: 20.0000 mg | ORAL_TABLET | Freq: Every day | ORAL | Status: DC

## 2010-10-11 MED ORDER — LEVOTHYROXINE SODIUM 50 MCG PO TABS: 50.0000 ug | ORAL_TABLET | Freq: Every day | ORAL | Status: DC

## 2010-10-11 NOTE — Progress Notes (Signed)
  Subjective:    Patient ID: [name], female    DOB: Aug 07, 1940, 70 y.o.   MRN: [id number]  HPI Patient seen for medical followup. History of hypothyroidism, dyslipidemia, GERD, osteopenia, migraine headaches, and elevated blood pressure. Previously he has seen headache specialist for migraines. Prophylactic regimen of atenolol and Effexor which is working well. No headache in several months. Needs refills. Neurologist has retired.  Hypothyroidism treated with Synthroid. Ran out of medication this morning. Otherwise compliant with therapy. Needs followup lab work.  Simvastatin for hyperlipidemia. Hypertriglyceridemia treated with omega-3 supplement. No history of CAD. Denies recent chest pain. No dyspnea. Exercises occasionally   Review of Systems  Constitutional: Negative for fever, activity change, appetite change, fatigue and unexpected weight change.  Respiratory: Negative for cough and shortness of breath.   Cardiovascular: Negative for chest pain, palpitations and leg swelling.  Gastrointestinal: Negative for abdominal pain.  Neurological: Negative for dizziness, syncope, weakness and headaches.  Hematological: Negative for adenopathy. Does not bruise/bleed easily.  Psychiatric/Behavioral: Negative for dysphoric mood.       Objective:   Physical Exam  Constitutional: She is oriented to person, place, and time. She appears well-developed and well-nourished. No distress.  Neck: Neck supple. No thyromegaly present.  Cardiovascular: Normal rate, regular rhythm and normal heart sounds.   No murmur heard. Pulmonary/Chest: Effort normal and breath sounds normal. No respiratory distress. She has no wheezes. She has no rales.  Musculoskeletal: She exhibits no edema.  Lymphadenopathy:    She has no cervical adenopathy.  Neurological: She is alert and oriented to person, place, and time.  Psychiatric: She has a normal mood and affect. Her behavior is normal.           Assessment & Plan:  #1 history of migraine headaches. Stable. Refill atenolol and Effexor for one year #2 hypothyroidism. Scheduled lab with TSH. Refilled levothyroxin for one year #3 history of dyslipidemia. Scheduled lipid and hepatic panel. Refilled simvastatin. Continue omega-3 supplement #4 wellness issues addressed. Recommend schedule Medicare wellness exam

## 2010-10-11 NOTE — Telephone Encounter (Signed)
OK to refill for 3 months.  This is one medication that could have other side effects from abrupt withdrawal.

## 2010-10-11 NOTE — Telephone Encounter (Signed)
Pt ran out of Atenolol 50 mg today and it has been [facility]  He is no longer there seeing patients and will schedule OV to see you soon to discuss her options. Until then,  pt requesting Dr Caryl Never prescribe until she can get appt with another provider for her headaches. [facility] [name]

## 2010-10-11 NOTE — Telephone Encounter (Signed)
Pt here for OV, will fill

## 2010-10-12 ENCOUNTER — Other Ambulatory Visit (INDEPENDENT_AMBULATORY_CARE_PROVIDER_SITE_OTHER): Payer: Self-pay

## 2010-10-12 DIAGNOSIS — E785 Hyperlipidemia, unspecified: Secondary | ICD-10-CM

## 2010-10-12 DIAGNOSIS — E039 Hypothyroidism, unspecified: Secondary | ICD-10-CM

## 2010-10-12 LAB — BASIC METABOLIC PANEL
BUN: 27 mg/dL — ABNORMAL HIGH (ref 6–23)
CO2: 27 mEq/L (ref 19–32)
Calcium: 9.4 mg/dL (ref 8.4–10.5)
Chloride: 103 mEq/L (ref 96–112)
Creatinine, Ser: 0.9 mg/dL (ref 0.4–1.2)
GFR: 63.27 mL/min (ref >60.00)
Glucose, Bld: 111 mg/dL — ABNORMAL HIGH (ref 70–99)
Potassium: 3.4 mEq/L — ABNORMAL LOW (ref 3.5–5.1)
Sodium: 139 mEq/L (ref 135–145)

## 2010-10-12 LAB — HEPATIC FUNCTION PANEL
ALT: 20 U/L (ref 0–35)
AST: 22 U/L (ref 0–37)
Albumin: 4.4 g/dL (ref 3.5–5.2)
Alkaline Phosphatase: 71 U/L (ref 39–117)
Bilirubin, Direct: 0.1 mg/dL (ref 0.0–0.3)
Total Bilirubin: 0.6 mg/dL (ref 0.3–1.2)
Total Protein: 7 g/dL (ref 6.0–8.3)

## 2010-10-12 LAB — LIPID PANEL
Cholesterol: 227 mg/dL — ABNORMAL HIGH (ref 0–200)
HDL: 58.4 mg/dL (ref >39.00)
Total CHOL/HDL Ratio: 4
Triglycerides: 192 mg/dL — ABNORMAL HIGH (ref 0.0–149.0)
VLDL: 38.4 mg/dL (ref 0.0–40.0)

## 2010-10-12 LAB — TSH: TSH: 0.59 u[IU]/mL (ref 0.35–5.50)

## 2010-10-12 LAB — LDL CHOLESTEROL, DIRECT: Direct LDL: 145.5 mg/dL

## 2010-10-13 NOTE — Progress Notes (Signed)
Quick Note:  Pt informed, will mail pt list of [employer] and copy of labs ______

## 2010-11-03 ENCOUNTER — Telehealth: Payer: Self-pay | Admitting: *Deleted

## 2010-11-03 MED ORDER — SIMVASTATIN 40 MG PO TABS: 40.0000 mg | ORAL_TABLET | Freq: Every day | ORAL | Status: DC

## 2010-11-03 NOTE — Telephone Encounter (Signed)
Change mad to 40 mg, refilled X 1 year

## 2011-03-05 ENCOUNTER — Ambulatory Visit (INDEPENDENT_AMBULATORY_CARE_PROVIDER_SITE_OTHER): Payer: Medicare Other | Admitting: Internal Medicine

## 2011-03-05 ENCOUNTER — Encounter: Payer: Self-pay | Admitting: Internal Medicine

## 2011-03-05 VITALS — BP 110/70 | HR 70 | Temp 97.9°F | Wt 146.0 lb

## 2011-03-05 DIAGNOSIS — N390 Urinary tract infection, site not specified: Secondary | ICD-10-CM | POA: Insufficient documentation

## 2011-03-05 DIAGNOSIS — R300 Dysuria: Secondary | ICD-10-CM

## 2011-03-05 LAB — POCT URINALYSIS DIPSTICK
Bilirubin, UA: NEGATIVE
Glucose, UA: NEGATIVE
Ketones, UA: NEGATIVE
Nitrite, UA: NEGATIVE
Spec Grav, UA: >=1.03
Urobilinogen, UA: 0.2
pH, UA: 6

## 2011-03-05 MED ORDER — CIPROFLOXACIN HCL 500 MG PO TABS: 500.0000 mg | ORAL_TABLET | Freq: Two times a day (BID) | ORAL | Status: AC

## 2011-03-05 NOTE — Progress Notes (Signed)
  Subjective:    Patient ID: [name], female    DOB: June 22, 1940, 70 y.o.   MRN: [id number]  HPI Patient comes in today for SDA  For acute problem evaluation. Onset last pm hematuria and pain. No fever chills slight  Back pain.  No hx of utis like this.   Hx of stones  5 years  ago.  NO recent medical changes.  Going to [place] for the holiday.  Review of Systems No nausea or vomiting an took one excedrin ? If help.  No fever chills  Past history family history social history reviewed in the electronic medical record.     Objective:   Physical Exam wdwn  In nad looks younger than stated age. Abdomen:  Sof,t normal bowel sounds without hepatosplenomegaly, no guarding rebound or masses no CVA tenderness UA blood and leuks  positive  Sent for cx      Assessment & Plan:  Hematuria dysuria  prob acute UTI  Cannot take  macrobid and sxt mp  Has taken cipro before and did ok. rx and let her know cx result. Advised    Counseled.

## 2011-03-05 NOTE — Patient Instructions (Signed)
Antibiotic for urinary infection.  Call if needed. Other  treatments  You should be better by end of medication.

## 2011-03-08 LAB — URINE CULTURE: Colony Count: >=100000

## 2011-04-06 ENCOUNTER — Telehealth: Payer: Self-pay

## 2011-04-06 MED ORDER — ZOLPIDEM TARTRATE 5 MG PO TABS: 5.0000 mg | ORAL_TABLET | Freq: Every evening | ORAL | Status: DC | PRN

## 2011-04-06 NOTE — Telephone Encounter (Signed)
Rx sent to pharmacy   

## 2011-04-06 NOTE — Telephone Encounter (Signed)
May refill Ambien 5 mg one by mouth each bedtime when necessary #30 with no refill

## 2011-04-06 NOTE — Telephone Encounter (Signed)
Pt states she has not slept in the last few nights.  Pt states she is going out of town for Christmas and would like to have Ambien 5 mg called in to the pharmacy.  Pt states she has been on this smaller dosage before and it worked well. Pt will make an appt to come in after the holidays to further discuss this problem with Dr. Caryl Never.  Pls advise.

## 2011-08-02 ENCOUNTER — Encounter: Payer: Self-pay | Admitting: Gynecology

## 2011-08-02 DIAGNOSIS — M8580 Other specified disorders of bone density and structure, unspecified site: Secondary | ICD-10-CM | POA: Insufficient documentation

## 2011-08-02 DIAGNOSIS — I10 Essential (primary) hypertension: Secondary | ICD-10-CM | POA: Insufficient documentation

## 2011-08-02 DIAGNOSIS — M810 Age-related osteoporosis without current pathological fracture: Secondary | ICD-10-CM | POA: Insufficient documentation

## 2011-08-07 ENCOUNTER — Encounter: Payer: Self-pay | Admitting: Obstetrics and Gynecology

## 2011-08-07 ENCOUNTER — Ambulatory Visit (INDEPENDENT_AMBULATORY_CARE_PROVIDER_SITE_OTHER): Payer: Medicare Other | Admitting: Obstetrics and Gynecology

## 2011-08-07 VITALS — BP 124/74 | Ht 61.0 in | Wt 143.0 lb

## 2011-08-07 DIAGNOSIS — M899 Disorder of bone, unspecified: Secondary | ICD-10-CM

## 2011-08-07 DIAGNOSIS — M8580 Other specified disorders of bone density and structure, unspecified site: Secondary | ICD-10-CM

## 2011-08-07 DIAGNOSIS — N809 Endometriosis, unspecified: Secondary | ICD-10-CM | POA: Insufficient documentation

## 2011-08-07 DIAGNOSIS — R3915 Urgency of urination: Secondary | ICD-10-CM

## 2011-08-07 DIAGNOSIS — N952 Postmenopausal atrophic vaginitis: Secondary | ICD-10-CM

## 2011-08-07 DIAGNOSIS — R351 Nocturia: Secondary | ICD-10-CM

## 2011-08-07 NOTE — Progress Notes (Signed)
Patient came to see me today for further followup. Her bone densities showed osteopenia. We have treated  her with 2 years of IV Reclast. The first year went well but the second year she got severe leg pains. Her last injection was in January of 2011. She has been taking calcium and vitamin D with a low vitamin D level but stopped the D. Recently when she ran out. She has had no fractures. She does have nocturia several times a night. She also gets occasional urgency. She does not have dysuria or incontinence. She is currently not sexually active but is aware of vaginal dryness especially when she comes to see me. She is overdue for a mammogram. Her last bone density was in 2009. She is having no vaginal bleeding. She is having no pelvic pain.  ROS: 12 systems reviewed. Pertinent positives above. Other positives include hyperlipidemia, hypothyroidism, gerd, migraine headaches.  Physical examination:  Kennon Portela present. HEENT within normal limits. Neck: Thyroid not large. No masses. Supraclavicular nodes: not enlarged. Breasts: Examined in both sitting and lying  position. No skin changes and no masses. Abdomen: Soft no guarding rebound or masses or hernia. Pelvic: External: Within normal limits. BUS: Within normal limits. Vaginal:within normal limits. Poor  estrogen effect. No evidence of cystocele rectocele or enterocele. Cervix: clean. Uterus: Normal size and shape. Adnexa: No masses. Rectovaginal exam: Confirmatory and negative. Extremities: Within normal limits.  Assessment: #1. Osteopenia #2. Atrophic vaginitis #3. Detrussor instability  Plan: Mammogram and  bone density. Discussed medication for her bladder. She'll inform. She will call for vaginal estrogen before her next exam.

## 2011-11-09 ENCOUNTER — Other Ambulatory Visit: Payer: Self-pay | Admitting: Family Medicine

## 2011-11-09 ENCOUNTER — Other Ambulatory Visit: Payer: Self-pay | Admitting: *Deleted

## 2011-11-09 MED ORDER — LEVOTHYROXINE SODIUM 50 MCG PO TABS: 50.0000 ug | ORAL_TABLET | Freq: Every day | ORAL | Status: DC

## 2011-11-09 MED ORDER — VENLAFAXINE HCL ER 150 MG PO CP24: 150.0000 mg | ORAL_CAPSULE | Freq: Every day | ORAL | Status: DC

## 2011-11-09 MED ORDER — ATENOLOL 50 MG PO TABS: 50.0000 mg | ORAL_TABLET | Freq: Every day | ORAL | Status: DC

## 2011-11-15 ENCOUNTER — Telehealth: Payer: Self-pay | Admitting: *Deleted

## 2011-11-15 NOTE — Telephone Encounter (Signed)
Patient has been due for Reclast, but has also not had her Dexa done.  She has cancelled a couple times.  Talked to husband.  He said he will have her call back to schedule Dexa

## 2011-12-26 ENCOUNTER — Telehealth: Payer: Self-pay | Admitting: Family Medicine

## 2011-12-26 ENCOUNTER — Ambulatory Visit (INDEPENDENT_AMBULATORY_CARE_PROVIDER_SITE_OTHER): Payer: Medicare Other | Admitting: Family Medicine

## 2011-12-26 ENCOUNTER — Encounter: Payer: Self-pay | Admitting: Family Medicine

## 2011-12-26 VITALS — BP 142/72 | Temp 98.0°F | Wt 144.0 lb

## 2011-12-26 DIAGNOSIS — I10 Essential (primary) hypertension: Secondary | ICD-10-CM

## 2011-12-26 DIAGNOSIS — E039 Hypothyroidism, unspecified: Secondary | ICD-10-CM

## 2011-12-26 DIAGNOSIS — G4700 Insomnia, unspecified: Secondary | ICD-10-CM

## 2011-12-26 DIAGNOSIS — Z23 Encounter for immunization: Secondary | ICD-10-CM

## 2011-12-26 DIAGNOSIS — E785 Hyperlipidemia, unspecified: Secondary | ICD-10-CM

## 2011-12-26 DIAGNOSIS — R030 Elevated blood-pressure reading, without diagnosis of hypertension: Secondary | ICD-10-CM

## 2011-12-26 LAB — TSH: TSH: 0.42 u[IU]/mL (ref 0.35–5.50)

## 2011-12-26 MED ORDER — ATENOLOL 50 MG PO TABS: 50.0000 mg | ORAL_TABLET | Freq: Every day | ORAL | Status: DC

## 2011-12-26 MED ORDER — SIMVASTATIN 40 MG PO TABS: 40.0000 mg | ORAL_TABLET | Freq: Every day | ORAL | Status: DC

## 2011-12-26 MED ORDER — HYDROXYZINE HCL 25 MG PO TABS: 25.0000 mg | ORAL_TABLET | Freq: Four times a day (QID) | ORAL | Status: AC | PRN

## 2011-12-26 MED ORDER — LEVOTHYROXINE SODIUM 50 MCG PO TABS: 50.0000 ug | ORAL_TABLET | Freq: Every day | ORAL | Status: DC

## 2011-12-26 NOTE — Telephone Encounter (Signed)
Patient called stating that she need refills of her atenolol, levothyroxine, and simvastatin as these were not sent during her visit today. Please assist.

## 2011-12-26 NOTE — Progress Notes (Signed)
  Subjective:    Patient ID: [name], female    DOB: [birth year], 71 y.o.   MRN: [id number]  HPI  Medical followup. She has history of hypothyroidism, hyperlipidemia, migraine headaches, GERD, osteopenia, and hypertension. Medications reviewed. Compliant with all. She does not monitor her blood pressure. She's had some insomnia she has had chronically for years. Does drink caffeine frequently. No significant alcohol use. Due for followup labs including thyroid function, lipid panel, and hepatic panel. No recent chest pains. No dyspnea. No dizziness.  Past Medical History  Diagnosis Date  . Anemia   . GERD (gastroesophageal reflux disease)   . Headache   . Ulcer   . Kidney stones   . Rheumatic fever     age 30  . UTI (lower urinary tract infection)   . Ulnar neuropathy   . Osteopenia   . Hyperlipidemia   . Thyroid disease     Hypothyroid  . Osteoporosis   . Atrophic vaginitis   . Hypertension   . Endometriosis    Past Surgical History  Procedure Date  . Laparoscopic endometriosis fulguration 1978  . Ulnar nerve repair 2010  . Tonsillectomy   . Pelvic laparoscopy     reports that she has never smoked. She does not have any smokeless tobacco history on file. She reports that she does not drink alcohol. Her drug history not on file. family history includes Breast cancer in her mother and paternal grandmother; Colon cancer in her father; Diabetes in her paternal grandmother; Heart disease in her father and maternal grandmother; Hypertension in her mother; and Stroke in her father. Allergies  Allergen Reactions  . Nitrofurantoin     REACTION: GI upset  . Sulfamethoxazole W-Trimethoprim     REACTION: GI upset      Review of Systems  Constitutional: Negative for fatigue.  Eyes: Negative for visual disturbance.  Respiratory: Negative for cough, chest tightness, shortness of breath and wheezing.   Cardiovascular: Negative for chest pain, palpitations and leg swelling.    Neurological: Negative for dizziness, seizures, syncope, weakness, light-headedness and headaches.  Psychiatric/Behavioral: Positive for disturbed wake/sleep cycle. Negative for suicidal ideas and dysphoric mood.       Objective:   Physical Exam  Constitutional: She is oriented to person, place, and time. She appears well-developed and well-nourished.  Neck: Neck supple. No thyromegaly present.  Cardiovascular: Normal rate and regular rhythm.   Pulmonary/Chest: Effort normal and breath sounds normal. No respiratory distress. She has no wheezes. She has no rales.  Musculoskeletal: She exhibits no edema.  Neurological: She is alert and oriented to person, place, and time.          Assessment & Plan:  #1 hypothyroidism. Recheck TSH  #2 hyperlipidemia. Check lipid and hepatic panel  #3 hypertension. Borderline elevation today. Work on weight loss and monitor over the next few months. Be in touch if consistently over 140/90. Check basic metabolic panel with HCTZ use  #4 insomnia. Reduce caffeine use, especially after 1 PM. Sleep hygiene discussed. #5 health maintenance. Flu vaccine given

## 2011-12-27 ENCOUNTER — Other Ambulatory Visit: Payer: Self-pay | Admitting: Family Medicine

## 2011-12-27 LAB — LIPID PANEL
Cholesterol: 200 mg/dL (ref 0–200)
HDL: 61 mg/dL (ref >39.00)
Total CHOL/HDL Ratio: 3
Triglycerides: 265 mg/dL — ABNORMAL HIGH (ref 0.0–149.0)
VLDL: 53 mg/dL — ABNORMAL HIGH (ref 0.0–40.0)

## 2011-12-27 LAB — BASIC METABOLIC PANEL
BUN: 25 mg/dL — ABNORMAL HIGH (ref 6–23)
CO2: 28 mEq/L (ref 19–32)
Calcium: 9.8 mg/dL (ref 8.4–10.5)
Chloride: 105 mEq/L (ref 96–112)
Creatinine, Ser: 1 mg/dL (ref 0.4–1.2)
GFR: 57.98 mL/min — ABNORMAL LOW (ref >60.00)
Glucose, Bld: 100 mg/dL — ABNORMAL HIGH (ref 70–99)
Potassium: 4.5 mEq/L (ref 3.5–5.1)
Sodium: 140 mEq/L (ref 135–145)

## 2011-12-27 LAB — HEPATIC FUNCTION PANEL
ALT: 22 U/L (ref 0–35)
AST: 26 U/L (ref 0–37)
Albumin: 4.4 g/dL (ref 3.5–5.2)
Alkaline Phosphatase: 72 U/L (ref 39–117)
Bilirubin, Direct: 0 mg/dL (ref 0.0–0.3)
Total Bilirubin: 0.5 mg/dL (ref 0.3–1.2)
Total Protein: 7.8 g/dL (ref 6.0–8.3)

## 2011-12-28 LAB — LDL CHOLESTEROL, DIRECT: Direct LDL: 108.9 mg/dL

## 2011-12-31 NOTE — Progress Notes (Signed)
Quick Note:  Called and spoke with pt and pt is aware. ______ 

## 2012-01-23 ENCOUNTER — Other Ambulatory Visit: Payer: Self-pay | Admitting: Family Medicine

## 2012-02-02 ENCOUNTER — Other Ambulatory Visit: Payer: Self-pay | Admitting: Family Medicine

## 2012-10-11 ENCOUNTER — Other Ambulatory Visit: Payer: Self-pay | Admitting: Family Medicine

## 2012-11-21 ENCOUNTER — Encounter: Payer: Self-pay | Admitting: Family Medicine

## 2012-11-21 ENCOUNTER — Ambulatory Visit (INDEPENDENT_AMBULATORY_CARE_PROVIDER_SITE_OTHER): Payer: Medicare Other | Admitting: Family Medicine

## 2012-11-21 VITALS — BP 136/76 | HR 76 | Temp 97.7°F | Wt 152.0 lb

## 2012-11-21 DIAGNOSIS — E039 Hypothyroidism, unspecified: Secondary | ICD-10-CM

## 2012-11-21 DIAGNOSIS — I10 Essential (primary) hypertension: Secondary | ICD-10-CM

## 2012-11-21 DIAGNOSIS — Z23 Encounter for immunization: Secondary | ICD-10-CM

## 2012-11-21 DIAGNOSIS — E785 Hyperlipidemia, unspecified: Secondary | ICD-10-CM

## 2012-11-21 LAB — BASIC METABOLIC PANEL
BUN: 19 mg/dL (ref 6–23)
CO2: 27 mEq/L (ref 19–32)
Calcium: 9.8 mg/dL (ref 8.4–10.5)
Chloride: 104 mEq/L (ref 96–112)
Creatinine, Ser: 1 mg/dL (ref 0.4–1.2)
GFR: 57.18 mL/min — ABNORMAL LOW (ref >60.00)
Glucose, Bld: 132 mg/dL — ABNORMAL HIGH (ref 70–99)
Potassium: 3.8 mEq/L (ref 3.5–5.1)
Sodium: 139 mEq/L (ref 135–145)

## 2012-11-21 LAB — HEPATIC FUNCTION PANEL
ALT: 30 U/L (ref 0–35)
AST: 30 U/L (ref 0–37)
Albumin: 4.5 g/dL (ref 3.5–5.2)
Alkaline Phosphatase: 68 U/L (ref 39–117)
Bilirubin, Direct: 0 mg/dL (ref 0.0–0.3)
Total Bilirubin: 0.5 mg/dL (ref 0.3–1.2)
Total Protein: 7.7 g/dL (ref 6.0–8.3)

## 2012-11-21 LAB — LIPID PANEL
Cholesterol: 181 mg/dL (ref 0–200)
HDL: 60.9 mg/dL (ref >39.00)
Total CHOL/HDL Ratio: 3
Triglycerides: 246 mg/dL — ABNORMAL HIGH (ref 0.0–149.0)
VLDL: 49.2 mg/dL — ABNORMAL HIGH (ref 0.0–40.0)

## 2012-11-21 LAB — TSH: TSH: 0.44 u[IU]/mL (ref 0.35–5.50)

## 2012-11-21 MED ORDER — SIMVASTATIN 40 MG PO TABS: 40.0000 mg | ORAL_TABLET | Freq: Every day | ORAL | Status: DC

## 2012-11-21 MED ORDER — ATENOLOL 50 MG PO TABS: 50.0000 mg | ORAL_TABLET | Freq: Every day | ORAL | Status: DC

## 2012-11-21 MED ORDER — HYDROCHLOROTHIAZIDE 25 MG PO TABS: 25.0000 mg | ORAL_TABLET | Freq: Every day | ORAL | Status: DC

## 2012-11-21 MED ORDER — VENLAFAXINE HCL ER 150 MG PO CP24: 150.0000 mg | ORAL_CAPSULE | Freq: Every day | ORAL | Status: DC

## 2012-11-21 MED ORDER — LEVOTHYROXINE SODIUM 50 MCG PO TABS: 50.0000 ug | ORAL_TABLET | Freq: Every day | ORAL | Status: DC

## 2012-11-21 NOTE — Progress Notes (Signed)
  Subjective:    Patient ID: [name], female    DOB: 1940-08-25, 72 y.o.   MRN: [id number]  HPI  Patient here for medical followup. She has history of hypertension, hyperlipidemia, hypothyroidism, endometriosis, and GERD. Medications reviewed. Compliant with all. She generally feels well. She remains on Effexor for migraine prophylaxis. She's also some depression in the past. She is reluctant to stop.  She's not had any recent chest pains. She has some chronic shortness of breath with activity which is unchanged for many years. No history of smoking.  She's not had any documented Pneumovax. Also needs repeat mammogram.  Past Medical History  Diagnosis Date  . Anemia   . GERD (gastroesophageal reflux disease)   . Headache(784.0)   . Ulcer   . Kidney stones   . Rheumatic fever     age 46  . UTI (lower urinary tract infection)   . Ulnar neuropathy   . Osteopenia   . Hyperlipidemia   . Thyroid disease     Hypothyroid  . Osteoporosis   . Atrophic vaginitis   . Hypertension   . Endometriosis    Past Surgical History  Procedure Laterality Date  . Laparoscopic endometriosis fulguration  1978  . Ulnar nerve repair  2010  . Tonsillectomy    . Pelvic laparoscopy      reports that she has never smoked. She does not have any smokeless tobacco history on file. She reports that she does not drink alcohol. Her drug history is not on file. family history includes Breast cancer in her mother and paternal grandmother; Colon cancer in her father; Diabetes in her paternal grandmother; Heart disease in her father and maternal grandmother; Hypertension in her mother; and Stroke in her father. Allergies  Allergen Reactions  . Nitrofurantoin     REACTION: GI upset  . Sulfamethoxazole W-Trimethoprim     REACTION: GI upset     Review of Systems  Constitutional: Negative for fatigue and unexpected weight change.  Eyes: Negative for visual disturbance.  Respiratory: Negative for cough,  chest tightness, shortness of breath and wheezing.   Cardiovascular: Negative for chest pain, palpitations and leg swelling.  Neurological: Negative for dizziness, seizures, syncope, weakness, light-headedness and headaches.       Objective:   Physical Exam  Constitutional: She appears well-developed and well-nourished. No distress.  Neck: Neck supple. No thyromegaly present.  Cardiovascular: Normal rate and regular rhythm.   Pulmonary/Chest: Effort normal and breath sounds normal. No respiratory distress. She has no wheezes. She has no rales.  Musculoskeletal: She exhibits no edema.  Psychiatric: She has a normal mood and affect. Her behavior is normal.          Assessment & Plan:  #1 hypertension. Stable. Check basic metabolic panel #2 hyperlipidemia. Check lipid and hepatic panel. Refill simvastatin #3 hypothyroidism. Recheck TSH. Refill Synthroid for one year #4 health maintenance. Schedule mammogram. Pneumovax given.

## 2012-11-21 NOTE — Patient Instructions (Addendum)

## 2012-11-24 ENCOUNTER — Encounter: Payer: Self-pay | Admitting: Family Medicine

## 2012-11-24 LAB — LDL CHOLESTEROL, DIRECT: Direct LDL: 99.4 mg/dL

## 2013-02-19 ENCOUNTER — Other Ambulatory Visit: Payer: Self-pay

## 2013-04-06 ENCOUNTER — Telehealth: Payer: Self-pay | Admitting: Family Medicine

## 2013-04-06 NOTE — Telephone Encounter (Signed)
Refill once 

## 2013-04-06 NOTE — Telephone Encounter (Signed)
Last visit 11/21/12 Last refill 04/06/11 #30 0   Medication is not currently on patient list

## 2013-04-06 NOTE — Telephone Encounter (Signed)
Pt said she take generic ambien 5 mg #30 ONE TIME ONLY. Pt is going out of town.[facility].

## 2013-04-07 MED ORDER — ZOLPIDEM TARTRATE 5 MG PO TABS: 5.0000 mg | ORAL_TABLET | Freq: Every evening | ORAL | Status: DC | PRN

## 2013-04-07 NOTE — Telephone Encounter (Signed)
Faxed RX to pharmacy.  

## 2013-08-31 ENCOUNTER — Telehealth: Payer: Self-pay | Admitting: Family Medicine

## 2013-08-31 MED ORDER — TOBRAMYCIN 0.3 % OP SOLN: 2.0000 [drp] | OPHTHALMIC | Status: DC

## 2013-08-31 NOTE — Telephone Encounter (Signed)
Symptoms noted.  Will send in Tobrex eye drops and office evaluation if no better.

## 2013-08-31 NOTE — Telephone Encounter (Signed)
Pt states she has conjunctivitis, and would like a med for this . Pt states her eyelashes stuck together this am and eyes are pink. 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 Pt refused appt . [facility]

## 2013-09-01 NOTE — Telephone Encounter (Signed)
Pt informed that Rx is sent to pharmacy and to make office visit if not better.

## 2013-11-30 ENCOUNTER — Other Ambulatory Visit: Payer: Self-pay | Admitting: Family Medicine

## 2013-11-30 ENCOUNTER — Telehealth: Payer: Self-pay | Admitting: Family Medicine

## 2013-11-30 MED ORDER — SIMVASTATIN 40 MG PO TABS: 40.0000 mg | ORAL_TABLET | Freq: Every day | ORAL | Status: DC

## 2013-11-30 NOTE — Telephone Encounter (Signed)
Rx sent to pharmacy   

## 2013-11-30 NOTE — Telephone Encounter (Signed)
[id number] - [place], [place] - [address] is requesting 90 day re-fill on simvastatin (ZOCOR) 40 MG tablet

## 2013-12-12 ENCOUNTER — Other Ambulatory Visit: Payer: Self-pay | Admitting: Family Medicine

## 2014-04-06 ENCOUNTER — Telehealth: Payer: Self-pay

## 2014-04-06 ENCOUNTER — Telehealth: Payer: Self-pay | Admitting: Family Medicine

## 2014-04-06 NOTE — Telephone Encounter (Signed)
[facility] - Client [place] Patient Name: [name] Gender: Female DOB: 06/08/1940 Age: 73 Y 11 M 5 D Return Phone Number: [phone] (Primary) Address: [address] City/State/Zip: [place] [address] Client [name] - Client Client Site [place] - Day Physician [name] Contact Type Call Call Type Triage / Clinical Relationship To Patient Self Return Phone Number [phone] (Primary) Chief Complaint Insomnia Initial Comment Caller states has severe insomnia, wants med called in. Nurse Assessment Nurse: Erlene Quan, RN, Manuela Schwartz Date/Time Eilene Ghazi Time): 04/06/2014 1:13:38 PM Confirm and document reason for call. If symptomatic, describe symptoms. ---Caller states has severe insomnia - she has had it for years every year they go to visit family and its gets worse and Dr Calls her Lorrin Mais but she want to know if he can call her in [place] for her this time - [facility] Has the patient traveled out of the country within the last 30 days? ---Not Applicable Does the patient require triage? ---No Please document clinical information provided and list any resource used. ---caller was warm transferred to office so message can be sent to MD Guidelines Guideline Title Affirmed Question Affirmed Notes Nurse Date/Time ([place] Time) [place]. Time Eilene Ghazi Time) Disposition Final User 04/06/2014 1:19:56 PM Clinical Call Yes Erlene Quan, RN, Manuela Schwartz After Care Instructions Given Call Event Type User Date / Time Description

## 2014-04-06 NOTE — Telephone Encounter (Signed)
Pt has not been seen since 11/2012.

## 2014-04-06 NOTE — Telephone Encounter (Signed)
Needs follow-up

## 2014-04-06 NOTE — Telephone Encounter (Signed)
Patient is requesting trazodone called in to [place], [place].  She states she usually gets Ambien for insomnia but does not want that this time.  Patient is aware that her last office visit was 11/21/12 and that she may need to come see Dr. Elease Hashimoto.  She states that she is leaving town tomorrow and needs this medication.

## 2014-04-06 NOTE — Telephone Encounter (Signed)
I do not see that patient has been on Trazodone.

## 2014-04-07 NOTE — Telephone Encounter (Signed)
Pt informed

## 2014-05-17 DIAGNOSIS — M25562 Pain in left knee: Secondary | ICD-10-CM | POA: Diagnosis not present

## 2014-08-03 ENCOUNTER — Encounter: Payer: Self-pay | Admitting: Family Medicine

## 2014-08-03 ENCOUNTER — Ambulatory Visit (INDEPENDENT_AMBULATORY_CARE_PROVIDER_SITE_OTHER): Payer: Medicare Other | Admitting: Family Medicine

## 2014-08-03 VITALS — BP 120/64 | HR 104 | Temp 98.3°F | Ht 61.0 in | Wt 155.0 lb

## 2014-08-03 DIAGNOSIS — Z23 Encounter for immunization: Secondary | ICD-10-CM | POA: Diagnosis not present

## 2014-08-03 DIAGNOSIS — E785 Hyperlipidemia, unspecified: Secondary | ICD-10-CM | POA: Diagnosis not present

## 2014-08-03 DIAGNOSIS — E038 Other specified hypothyroidism: Secondary | ICD-10-CM

## 2014-08-03 DIAGNOSIS — I10 Essential (primary) hypertension: Secondary | ICD-10-CM

## 2014-08-03 MED ORDER — HYDROCHLOROTHIAZIDE 25 MG PO TABS: 25.0000 mg | ORAL_TABLET | Freq: Every day | ORAL | Status: DC

## 2014-08-03 MED ORDER — ATENOLOL 50 MG PO TABS: 50.0000 mg | ORAL_TABLET | Freq: Every day | ORAL | Status: DC

## 2014-08-03 MED ORDER — SIMVASTATIN 40 MG PO TABS: 40.0000 mg | ORAL_TABLET | Freq: Every day | ORAL | Status: DC

## 2014-08-03 MED ORDER — VENLAFAXINE HCL ER 150 MG PO CP24: 150.0000 mg | ORAL_CAPSULE | Freq: Every day | ORAL | Status: DC

## 2014-08-03 MED ORDER — LEVOTHYROXINE SODIUM 50 MCG PO TABS: ORAL_TABLET | ORAL | Status: DC

## 2014-08-03 NOTE — Progress Notes (Signed)
   Subjective:    Patient ID: [name], female    DOB: 07/27/1940, 74 y.o.   MRN: [id number]  HPI Patient seen for medical follow-up. She has history of hypertension, hyperlipidemia, hypothyroidism. Medications reviewed and compliant with all. She's had some recent problems with left knee osteoarthritis. She saw orthopedist and had the injection with minimal improvement. She is currently taking glucosamine.   no lab work in over one year. No history of Prevnar 13.  Past Medical History  Diagnosis Date  . Anemia   . GERD (gastroesophageal reflux disease)   . Headache(784.0)   . Ulcer   . Kidney stones   . Rheumatic fever     age 88  . UTI (lower urinary tract infection)   . Ulnar neuropathy   . Osteopenia   . Hyperlipidemia   . Thyroid disease     Hypothyroid  . Osteoporosis   . Atrophic vaginitis   . Hypertension   . Endometriosis    Past Surgical History  Procedure Laterality Date  . Laparoscopic endometriosis fulguration  1978  . Ulnar nerve repair  2010  . Tonsillectomy    . Pelvic laparoscopy      reports that she has never smoked. She does not have any smokeless tobacco history on file. She reports that she does not drink alcohol. Her drug history is not on file. family history includes Breast cancer in her mother and paternal grandmother; Colon cancer in her father; Diabetes in her paternal grandmother; Heart disease in her father and maternal grandmother; Hypertension in her mother; Stroke in her father. Allergies  Allergen Reactions  . Nitrofurantoin     REACTION: GI upset  . Sulfamethoxazole-Trimethoprim     REACTION: GI upset      Review of Systems  Constitutional: Negative for fatigue.  Eyes: Negative for visual disturbance.  Respiratory: Negative for cough, chest tightness, shortness of breath and wheezing.   Cardiovascular: Negative for chest pain, palpitations and leg swelling.  Neurological: Negative for dizziness, seizures, syncope, weakness,  light-headedness and headaches.       Objective:   Physical Exam  Constitutional: She appears well-developed and well-nourished. No distress.  Neck: Neck supple. No thyromegaly present.  Cardiovascular: Normal rate and regular rhythm.  Exam reveals no gallop.   Pulmonary/Chest: Effort normal and breath sounds normal. No respiratory distress. She has no wheezes. She has no rales.  Musculoskeletal: She exhibits no edema.          Assessment & Plan:  #1 hypertension. Stable and at goal. Continue current medications.  Refills given for one year #2 hyperlipidemia. Check lipid and hepatic panel. She'll come back fasting for this #3 hypothyroidism. Repeat TSH #4 health maintenance. Prevnar 13 given. Recommend schedule complete physical at some point later this year

## 2014-08-03 NOTE — Progress Notes (Signed)
Pre visit review using our clinic review tool, if applicable. No additional management support is needed unless otherwise documented below in the visit note. 

## 2014-08-04 ENCOUNTER — Other Ambulatory Visit (INDEPENDENT_AMBULATORY_CARE_PROVIDER_SITE_OTHER): Payer: Medicare Other

## 2014-08-04 ENCOUNTER — Encounter: Payer: Self-pay | Admitting: Family Medicine

## 2014-08-04 DIAGNOSIS — I10 Essential (primary) hypertension: Secondary | ICD-10-CM

## 2014-08-04 DIAGNOSIS — E038 Other specified hypothyroidism: Secondary | ICD-10-CM | POA: Diagnosis not present

## 2014-08-04 DIAGNOSIS — E785 Hyperlipidemia, unspecified: Secondary | ICD-10-CM

## 2014-08-04 LAB — HEPATIC FUNCTION PANEL
ALT: 24 U/L (ref 0–35)
AST: 25 U/L (ref 0–37)
Albumin: 4.4 g/dL (ref 3.5–5.2)
Alkaline Phosphatase: 76 U/L (ref 39–117)
Bilirubin, Direct: 0.1 mg/dL (ref 0.0–0.3)
Total Bilirubin: 0.3 mg/dL (ref 0.2–1.2)
Total Protein: 7.3 g/dL (ref 6.0–8.3)

## 2014-08-04 LAB — BASIC METABOLIC PANEL
BUN: 29 mg/dL — ABNORMAL HIGH (ref 6–23)
CO2: 28 mEq/L (ref 19–32)
Calcium: 9.6 mg/dL (ref 8.4–10.5)
Chloride: 102 mEq/L (ref 96–112)
Creatinine, Ser: 0.94 mg/dL (ref 0.40–1.20)
GFR: 61.83 mL/min (ref >60.00)
Glucose, Bld: 110 mg/dL — ABNORMAL HIGH (ref 70–99)
Potassium: 4.8 mEq/L (ref 3.5–5.1)
Sodium: 138 mEq/L (ref 135–145)

## 2014-08-04 LAB — LIPID PANEL
Cholesterol: 207 mg/dL — ABNORMAL HIGH (ref 0–200)
HDL: 62.2 mg/dL (ref >39.00)
Total CHOL/HDL Ratio: 3
Triglycerides: 490 mg/dL — ABNORMAL HIGH (ref 0.0–149.0)

## 2014-08-04 LAB — TSH: TSH: 1.04 u[IU]/mL (ref 0.35–4.50)

## 2014-08-04 LAB — LDL CHOLESTEROL, DIRECT: Direct LDL: 111 mg/dL

## 2014-08-23 ENCOUNTER — Telehealth: Payer: Self-pay | Admitting: Family Medicine

## 2014-08-23 ENCOUNTER — Other Ambulatory Visit: Payer: Self-pay | Admitting: Family Medicine

## 2014-08-23 DIAGNOSIS — R7303 Prediabetes: Secondary | ICD-10-CM

## 2014-08-23 MED ORDER — ZOLPIDEM TARTRATE 5 MG PO TABS: 5.0000 mg | ORAL_TABLET | Freq: Every evening | ORAL | Status: DC | PRN

## 2014-08-23 NOTE — Telephone Encounter (Signed)
Pt seen 4/19 and had high a1c.  Pt states she has lost 5 lbs and will continue to loose more, bc she doesn't want to be diabetic.  However, pt would like to know when to return for ov.  pls advise.   Pt also wants refill of zolpidem (AMBIEN) 5 MG tablet [facility] [address]

## 2014-08-23 NOTE — Telephone Encounter (Signed)
Repeat A1C by early August.

## 2014-08-23 NOTE — Telephone Encounter (Signed)
Rx sent to pharmacy. Can you please call patient to set for patient in August. Lab is ordered.

## 2014-08-23 NOTE — Telephone Encounter (Signed)
Last refill 04/07/13

## 2014-08-23 NOTE — Telephone Encounter (Signed)
Is it okay to refill Ambien

## 2014-08-23 NOTE — Telephone Encounter (Signed)
Refill once.  Try to avoid regular use. 

## 2014-08-24 NOTE — Telephone Encounter (Signed)
Pt would like to know if you have any advise on not becoming diabetic, since sh is already pre-diabetic   Pt would like to exerscie, but has arthritis in her knee which hurts, is she doing more harm than good? Pt has mychart and states feel free to communicate that way.  Pt has scheduled a1c for august.

## 2014-08-25 NOTE — Telephone Encounter (Signed)
Can you please schedule patient for visit.

## 2014-08-25 NOTE — Telephone Encounter (Signed)
Office follow up and we can discuss further.

## 2014-08-26 NOTE — Telephone Encounter (Signed)
Lm on vm to cb °

## 2014-08-30 ENCOUNTER — Ambulatory Visit (INDEPENDENT_AMBULATORY_CARE_PROVIDER_SITE_OTHER): Payer: Medicare Other | Admitting: Family Medicine

## 2014-08-30 ENCOUNTER — Encounter: Payer: Self-pay | Admitting: Family Medicine

## 2014-08-30 VITALS — BP 130/80 | HR 63 | Temp 98.3°F | Wt 152.4 lb

## 2014-08-30 DIAGNOSIS — R7309 Other abnormal glucose: Secondary | ICD-10-CM | POA: Diagnosis not present

## 2014-08-30 DIAGNOSIS — R7303 Prediabetes: Secondary | ICD-10-CM | POA: Insufficient documentation

## 2014-08-30 NOTE — Progress Notes (Signed)
Pre visit review using our clinic review tool, if applicable. No additional management support is needed unless otherwise documented below in the visit note. 

## 2014-08-30 NOTE — Patient Instructions (Addendum)

## 2014-08-30 NOTE — Progress Notes (Signed)
   Subjective:    Patient ID: [name], female    DOB: 31-Dec-1940, 74 y.o.   MRN: [id number]  HPI   Here to discuss hyperglycemia. Recent fasting blood sugar 110. She's not had any polyuria or polydipsia. We've already scheduled A1c for August. She would like to make some lifestyle changes to try and prevent becoming full-fledged diabetic and is here to discuss that today. She is currently not exercising regularly. Weight has been relatively stable. She has already made some dietary changes over the past week.  Past Medical History  Diagnosis Date  . Anemia   . GERD (gastroesophageal reflux disease)   . Headache(784.0)   . Ulcer   . Kidney stones   . Rheumatic fever     age 70  . UTI (lower urinary tract infection)   . Ulnar neuropathy   . Osteopenia   . Hyperlipidemia   . Thyroid disease     Hypothyroid  . Osteoporosis   . Atrophic vaginitis   . Hypertension   . Endometriosis    Past Surgical History  Procedure Laterality Date  . Laparoscopic endometriosis fulguration  1978  . Ulnar nerve repair  2010  . Tonsillectomy    . Pelvic laparoscopy      reports that she has never smoked. She does not have any smokeless tobacco history on file. She reports that she does not drink alcohol. Her drug history is not on file. family history includes Breast cancer in her mother and paternal grandmother; Colon cancer in her father; Diabetes in her paternal grandmother; Heart disease in her father and maternal grandmother; Hypertension in her mother; Stroke in her father. Allergies  Allergen Reactions  . Nitrofurantoin     REACTION: GI upset  . Sulfamethoxazole-Trimethoprim     REACTION: GI upset  \   Review of Systems  Endocrine: Negative for polydipsia and polyuria.       Objective:   Physical Exam  Constitutional: She appears well-developed and well-nourished.  Cardiovascular: Normal rate and regular rhythm.   Pulmonary/Chest: Effort normal and breath sounds normal.  No respiratory distress. She has no wheezes. She has no rales.          Assessment & Plan:  Hyperglycemia/Prediabetes. We discussed lifestyle management. Home glucose monitor given. A1c in August. Establish more consistent exercise. Lose some weight. Reduce sugars and starches. Education given

## 2014-09-21 ENCOUNTER — Telehealth: Payer: Self-pay

## 2014-09-21 DIAGNOSIS — Z1231 Encounter for screening mammogram for malignant neoplasm of breast: Secondary | ICD-10-CM

## 2014-09-21 NOTE — Telephone Encounter (Signed)
Spoke with pt. Ordered mammogram. 

## 2014-09-28 ENCOUNTER — Encounter: Payer: Self-pay | Admitting: Family Medicine

## 2014-09-28 ENCOUNTER — Other Ambulatory Visit: Payer: Self-pay | Admitting: *Deleted

## 2014-09-28 ENCOUNTER — Ambulatory Visit (INDEPENDENT_AMBULATORY_CARE_PROVIDER_SITE_OTHER): Payer: Medicare Other | Admitting: Family Medicine

## 2014-09-28 VITALS — BP 130/80 | HR 58 | Temp 97.8°F | Ht 61.0 in | Wt 150.0 lb

## 2014-09-28 DIAGNOSIS — N3000 Acute cystitis without hematuria: Secondary | ICD-10-CM

## 2014-09-28 DIAGNOSIS — R300 Dysuria: Secondary | ICD-10-CM | POA: Diagnosis not present

## 2014-09-28 LAB — POCT URINALYSIS DIPSTICK
Glucose, UA: NEGATIVE
Nitrite, UA: POSITIVE
Spec Grav, UA: >=1.03
Urobilinogen, UA: 1
pH, UA: 5.5

## 2014-09-28 MED ORDER — CEPHALEXIN 500 MG PO CAPS: 500.0000 mg | ORAL_CAPSULE | Freq: Three times a day (TID) | ORAL | Status: DC

## 2014-09-28 NOTE — Progress Notes (Signed)
   Subjective:    Patient ID: [name], female    DOB: 09-20-1940, 74 y.o.   MRN: [id number]  HPI Patient seen with 4 day history of urine frequency and burning with urination. She denies any nausea, vomiting, fever, chills, flank pain, or any abdominal pain. She has not had UTI since 2012. History of intolerance to Macrobid and sulfa.  Past Medical History  Diagnosis Date  . Anemia   . GERD (gastroesophageal reflux disease)   . Headache(784.0)   . Ulcer   . Kidney stones   . Rheumatic fever     age 50  . UTI (lower urinary tract infection)   . Ulnar neuropathy   . Osteopenia   . Hyperlipidemia   . Thyroid disease     Hypothyroid  . Osteoporosis   . Atrophic vaginitis   . Hypertension   . Endometriosis    Past Surgical History  Procedure Laterality Date  . Laparoscopic endometriosis fulguration  1978  . Ulnar nerve repair  2010  . Tonsillectomy    . Pelvic laparoscopy      reports that she has never smoked. She does not have any smokeless tobacco history on file. She reports that she does not drink alcohol. Her drug history is not on file. family history includes Breast cancer in her mother and paternal grandmother; Colon cancer in her father; Diabetes in her paternal grandmother; Heart disease in her father and maternal grandmother; Hypertension in her mother; Stroke in her father. Allergies  Allergen Reactions  . Nitrofurantoin     REACTION: GI upset  . Sulfamethoxazole-Trimethoprim     REACTION: GI upset      Review of Systems  Constitutional: Negative for fever, chills and appetite change.  Gastrointestinal: Negative for nausea, vomiting, abdominal pain, diarrhea and constipation.  Genitourinary: Positive for dysuria and frequency. Negative for flank pain.  Musculoskeletal: Negative for back pain.  Neurological: Negative for dizziness.       Objective:   Physical Exam  Constitutional: She appears well-developed and well-nourished.  HENT:  Head:  Normocephalic and atraumatic.  Neck: Neck supple. No thyromegaly present.  Cardiovascular: Normal rate, regular rhythm and normal heart sounds.   Pulmonary/Chest: Breath sounds normal.  Abdominal: Soft. Bowel sounds are normal. There is no tenderness.          Assessment & Plan:  Dysuria. Suspect uncomplicated cystitis. Urine culture sent. Keflex 500 mg 3 times a day for 5 days.

## 2014-09-28 NOTE — Patient Instructions (Signed)

## 2014-09-28 NOTE — Progress Notes (Signed)
Pre visit review using our clinic review tool, if applicable. No additional management support is needed unless otherwise documented below in the visit note. 

## 2014-10-01 LAB — URINE CULTURE: Colony Count: >=100000

## 2014-11-15 ENCOUNTER — Encounter: Payer: Self-pay | Admitting: Family Medicine

## 2014-11-15 ENCOUNTER — Other Ambulatory Visit (INDEPENDENT_AMBULATORY_CARE_PROVIDER_SITE_OTHER): Payer: Medicare Other

## 2014-11-15 DIAGNOSIS — R7309 Other abnormal glucose: Secondary | ICD-10-CM | POA: Diagnosis not present

## 2014-11-15 DIAGNOSIS — R7303 Prediabetes: Secondary | ICD-10-CM

## 2014-11-15 LAB — HEMOGLOBIN A1C: Hgb A1c MFr Bld: 5.8 % (ref 4.6–6.5)

## 2014-11-24 DIAGNOSIS — H524 Presbyopia: Secondary | ICD-10-CM | POA: Diagnosis not present

## 2014-11-24 DIAGNOSIS — H04123 Dry eye syndrome of bilateral lacrimal glands: Secondary | ICD-10-CM | POA: Diagnosis not present

## 2015-01-07 ENCOUNTER — Other Ambulatory Visit: Payer: Self-pay | Admitting: *Deleted

## 2015-01-07 MED ORDER — VENLAFAXINE HCL ER 150 MG PO CP24: 150.0000 mg | ORAL_CAPSULE | Freq: Every day | ORAL | Status: DC

## 2015-01-12 ENCOUNTER — Other Ambulatory Visit: Payer: Self-pay | Admitting: Family Medicine

## 2015-01-12 NOTE — Telephone Encounter (Signed)
Refill once.  Try to avoid regular use. 

## 2015-01-12 NOTE — Telephone Encounter (Signed)
Rx last filled on 5.9.2016 #30. Pt last seen on 6.14.2016. Pls advise.

## 2015-01-13 MED ORDER — ZOLPIDEM TARTRATE 5 MG PO TABS: 5.0000 mg | ORAL_TABLET | Freq: Every evening | ORAL | Status: DC | PRN

## 2015-01-13 NOTE — Addendum Note (Signed)
Addended by: Colleen Can on: 01/13/2015 10:10 AM   Modules accepted: Orders

## 2015-01-13 NOTE — Telephone Encounter (Signed)
Rx called in to the office.

## 2015-01-25 ENCOUNTER — Telehealth: Payer: Self-pay | Admitting: *Deleted

## 2015-01-25 ENCOUNTER — Ambulatory Visit (INDEPENDENT_AMBULATORY_CARE_PROVIDER_SITE_OTHER): Payer: Medicare Other | Admitting: Family Medicine

## 2015-01-25 ENCOUNTER — Encounter: Payer: Self-pay | Admitting: Family Medicine

## 2015-01-25 VITALS — BP 130/62 | HR 61 | Temp 97.5°F | Wt 142.3 lb

## 2015-01-25 DIAGNOSIS — R42 Dizziness and giddiness: Secondary | ICD-10-CM

## 2015-01-25 MED ORDER — MECLIZINE HCL 25 MG PO TABS: 25.0000 mg | ORAL_TABLET | Freq: Three times a day (TID) | ORAL | Status: DC | PRN

## 2015-01-25 NOTE — Telephone Encounter (Signed)
Valtrex 1 gram -take two at onset of fever blister and repeat 2 in 12 hours  #30 with no refills.

## 2015-01-25 NOTE — Progress Notes (Signed)
HPI:  [name] is a 75 yo pt with a past PMH significant for headache, gerd, anemia, UTI, hld, hypothyroidism, prediabetes and HTN here for and Acute visit for:  Vertigo: -started about 3-4 days ago -spinning of room and nausea with certain quick movements of the head, brief spells that then resolve -denies: CP, SOB, DOE, problems with speech, thought processing or vision, weakness, numbness, HA, fevers, chills -does have a mild cold -hx of brief spells in the remote past, but less severe -on brief review of chart appear to have had stable TFT for a number of years, normal hgb1c recently and cholesterol well controlled  ROS: See pertinent positives and negatives per HPI.  Past Medical History  Diagnosis Date  . Anemia   . GERD (gastroesophageal reflux disease)   . Headache(784.0)   . Ulcer   . Kidney stones   . Rheumatic fever     age 24  . UTI (lower urinary tract infection)   . Ulnar neuropathy   . Osteopenia   . Hyperlipidemia   . Thyroid disease     Hypothyroid  . Osteoporosis   . Atrophic vaginitis   . Hypertension   . Endometriosis     Past Surgical History  Procedure Laterality Date  . Laparoscopic endometriosis fulguration  1978  . Ulnar nerve repair  2010  . Tonsillectomy    . Pelvic laparoscopy      Family History  Problem Relation Age of Onset  . Breast cancer Mother     Age 35  . Hypertension Mother   . Heart disease Father   . Stroke Father   . Colon cancer Father   . Breast cancer Paternal Grandmother     Age 28  . Diabetes Paternal Grandmother   . Heart disease Maternal Grandmother     Social History   Social History  . Marital Status: Married    Spouse Name: N/A  . Number of Children: N/A  . Years of Education: N/A   Social History Main Topics  . Smoking status: Never Smoker   . Smokeless tobacco: None  . Alcohol Use: No  . Drug Use: None  . Sexual Activity: No   Other Topics Concern  . None   Social History  Narrative     Current outpatient prescriptions:  .  atenolol (TENORMIN) 50 MG tablet, Take 1 tablet (50 mg total) by mouth daily., Disp: 30 tablet, Rfl: 11 .  cephALEXin (KEFLEX) 500 MG capsule, Take 1 capsule (500 mg total) by mouth 3 (three) times daily., Disp: 15 capsule, Rfl: 0 .  Cholecalciferol (VITAMIN D) 2000 UNITS CAPS, Take by mouth.  , Disp: , Rfl:  .  fish oil-omega-3 fatty acids 1000 MG capsule, Take 2 g by mouth daily.  , Disp: , Rfl:  .  hydrochlorothiazide (HYDRODIURIL) 25 MG tablet, Take 1 tablet (25 mg total) by mouth daily., Disp: 30 tablet, Rfl: 11 .  levothyroxine (SYNTHROID, LEVOTHROID) 50 MCG tablet, TAKE ONE TABLET BY MOUTH DAILY BEFORE BREAKFAST., Disp: 30 tablet, Rfl: 11 .  simvastatin (ZOCOR) 40 MG tablet, Take 1 tablet (40 mg total) by mouth at bedtime., Disp: 30 tablet, Rfl: 11 .  tobramycin (TOBREX) 0.3 % ophthalmic solution, Place 2 drops into both eyes every 4 (four) hours., Disp: 5 mL, Rfl: 0 .  venlafaxine XR (EFFEXOR-XR) 150 MG 24 hr capsule, Take 1 capsule (150 mg total) by mouth daily., Disp: 90 capsule, Rfl: 3 .  zolpidem (AMBIEN) 5 MG tablet, Take  1 tablet (5 mg total) by mouth at bedtime as needed for sleep., Disp: 30 tablet, Rfl: 0 .  meclizine (ANTIVERT) 25 MG tablet, Take 1 tablet (25 mg total) by mouth 3 (three) times daily as needed for dizziness., Disp: 30 tablet, Rfl: 0  EXAM:  Filed Vitals:   01/25/15 1310  BP: 130/62  Pulse: 61  Temp: 97.5 F (36.4 C)    Body mass index is 26.9 kg/(m^2).  GENERAL: vitals reviewed and listed above, alert, oriented, appears well hydrated and in no acute distress  HEENT: atraumatic, conjunttiva clear, PERRLA, visual acuity grossly intact, EOMI, no obvious abnormalities on inspection of external nose and ears  NECK: no obvious masses on inspection, no bruit  LUNGS: clear to auscultation bilaterally, no wheezes, rales or rhonchi, good air movement  CV: HRRR, no peripheral edema  MS: moves all  extremities without noticeable abnormality  PSYCH/NEURO: pleasant and cooperative, no obvious depression or anxiety, gait normal, CN II-XII grossly intact, finger to nose normal, speech and thought processing grossly intact, no pronator drift, dixhallpike + to the L  ASSESSMENT AND PLAN:  Discussed the following assessment and plan:  Vertigo - Plan: Ambulatory referral to Physical Therapy  -we discussed possible serious and likely etiologies, workup and treatment, treatment risks and return precautions - suspect positional vertigo, with no other symptoms or exam findings stroke or intercranial, neurodegenerative diseases, infectious or cardiac etiology less like - however we did discuss these possible causes of vertigo, offered neuroimaging and advised prompt emergency evaluation if worsening and close follow up with neuroimaging if symptoms persist -after this discussion, [name] opted for vestibular rehab, meclizine and close follow up -follow up advised i n2-4 weeks -of course, we advised [name]  to return or notify a doctor immediately if symptoms worsen or persist or new concerns arise.   -Patient advised to return or notify a doctor immediately if symptoms worsen or persist or new concerns arise.  Patient Instructions  BEFORE YOU LEAVE: -schedule follow up with your doctor in 2-4 weeks - sooner if worsneing or other concerns  -We placed a referral for you as discussed to the physical therapist. It usually takes about 1-2 weeks to process and schedule this referral. If you have not heard from [place] regarding this appointment in 2 weeks please contact our office.  -seek care immediately if worsening or new symptoms  Benign Positional Vertigo Vertigo is the feeling that you or your surroundings are moving when they are not. Benign positional vertigo is the most common form of vertigo. The cause of this condition is not serious (is benign). This condition is triggered by certain movements and  positions (is positional). This condition can be dangerous if it occurs while you are doing something that could endanger you or others, such as driving.  CAUSES In many cases, the cause of this condition is not known. It may be caused by a disturbance in an area of the inner ear that helps your brain to sense movement and balance. This disturbance can be caused by a viral infection (labyrinthitis), head injury, or repetitive motion. RISK FACTORS This condition is more likely to develop in:  Women.  People who are 38 years of age or older. SYMPTOMS Symptoms of this condition usually happen when you move your head or your eyes in different directions. Symptoms may start suddenly, and they usually last for less than a minute. Symptoms may include:  Loss of balance and falling.  Feeling like you are spinning or  moving.  Feeling like your surroundings are spinning or moving.  Nausea and vomiting.  Blurred vision.  Dizziness.  Involuntary eye movement (nystagmus). Symptoms can be mild and cause only slight annoyance, or they can be severe and interfere with daily life. Episodes of benign positional vertigo may return (recur) over time, and they may be triggered by certain movements. Symptoms may improve over time. DIAGNOSIS This condition is usually diagnosed by medical history and a physical exam of the head, neck, and ears. You may be referred to a health care provider who specializes in ear, nose, and throat (ENT) problems (otolaryngologist) or a provider who specializes in disorders of the nervous system (neurologist). You may have additional testing, including:  MRI.  A CT scan.  Eye movement tests. Your health care provider may ask you to change positions quickly while he or she watches you for symptoms of benign positional vertigo, such as nystagmus. Eye movement may be tested with an electronystagmogram (ENG), caloric stimulation, the Dix-Hallpike test, or the roll test.  An  electroencephalogram (EEG). This records electrical activity in your brain.  Hearing tests. TREATMENT Usually, your health care provider will treat this by moving your head in specific positions to adjust your inner ear back to normal. Surgery may be needed in severe cases, but this is rare. In some cases, benign positional vertigo may resolve on its own in 2-4 weeks. HOME CARE INSTRUCTIONS Safety  Move slowly.Avoid sudden body or head movements.  Avoid driving.  Avoid operating heavy machinery.  Avoid doing any tasks that would be dangerous to you or others if a vertigo episode would occur.  If you have trouble walking or keeping your balance, try using a cane for stability. If you feel dizzy or unstable, sit down right away.  Return to your normal activities as told by your health care provider. Ask your health care provider what activities are safe for you. General Instructions  Take over-the-counter and prescription medicines only as told by your health care provider.  Avoid certain positions or movements as told by your health care provider.  Drink enough fluid to keep your urine clear or pale yellow.  Keep all follow-up visits as told by your health care provider. This is important. SEEK MEDICAL CARE IF:  You have a fever.  Your condition gets worse or you develop new symptoms.  Your family or friends notice any behavioral changes.  Your nausea or vomiting gets worse.  You have numbness or a "pins and needles" sensation. SEEK IMMEDIATE MEDICAL CARE IF:  You have difficulty speaking or moving.  You are always dizzy.  You faint.  You develop severe headaches.  You have weakness in your legs or arms.  You have changes in your hearing or vision.  You develop a stiff neck.  You develop sensitivity to light.   This information is not intended to replace advice given to you by your health care provider. Make sure you discuss any questions you have with your  health care provider.   Document Released: 01/08/2006 Document Revised: 12/22/2014 Document Reviewed: 07/26/2014 Elsevier Interactive Patient Education 2016 [place], [place]

## 2015-01-25 NOTE — Telephone Encounter (Signed)
Patient was seen today by Dr Maudie Mercury and requested a refill on the medication Dr Elease Hashimoto gave her some time ago for fever blisters on her lip?  Please call Rx in to [facility] [employer] on [address] (patient is aware this will be addressed on Wed when Dr Elease Hashimoto returns to the office).

## 2015-01-25 NOTE — Patient Instructions (Addendum)
BEFORE YOU LEAVE: -schedule follow up with your doctor in 2-4 weeks - sooner if worsneing or other concerns  -We placed a referral for you as discussed to the physical therapist. It usually takes about 1-2 weeks to process and schedule this referral. If you have not heard from [place] regarding this appointment in 2 weeks please contact our office.  -Please do not drive until this has resolved  -seek care immediately if worsening or new symptoms  Benign Positional Vertigo Vertigo is the feeling that you or your surroundings are moving when they are not. Benign positional vertigo is the most common form of vertigo. The cause of this condition is not serious (is benign). This condition is triggered by certain movements and positions (is positional). This condition can be dangerous if it occurs while you are doing something that could endanger you or others, such as driving.  CAUSES In many cases, the cause of this condition is not known. It may be caused by a disturbance in an area of the inner ear that helps your brain to sense movement and balance. This disturbance can be caused by a viral infection (labyrinthitis), head injury, or repetitive motion. RISK FACTORS This condition is more likely to develop in:  Women.  People who are 60 years of age or older. SYMPTOMS Symptoms of this condition usually happen when you move your head or your eyes in different directions. Symptoms may start suddenly, and they usually last for less than a minute. Symptoms may include:  Loss of balance and falling.  Feeling like you are spinning or moving.  Feeling like your surroundings are spinning or moving.  Nausea and vomiting.  Blurred vision.  Dizziness.  Involuntary eye movement (nystagmus). Symptoms can be mild and cause only slight annoyance, or they can be severe and interfere with daily life. Episodes of benign positional vertigo may return (recur) over time, and they may be triggered by certain  movements. Symptoms may improve over time. DIAGNOSIS This condition is usually diagnosed by medical history and a physical exam of the head, neck, and ears. You may be referred to a health care provider who specializes in ear, nose, and throat (ENT) problems (otolaryngologist) or a provider who specializes in disorders of the nervous system (neurologist). You may have additional testing, including:  MRI.  A CT scan.  Eye movement tests. Your health care provider may ask you to change positions quickly while he or she watches you for symptoms of benign positional vertigo, such as nystagmus. Eye movement may be tested with an electronystagmogram (ENG), caloric stimulation, the Dix-Hallpike test, or the roll test.  An electroencephalogram (EEG). This records electrical activity in your brain.  Hearing tests. TREATMENT Usually, your health care provider will treat this by moving your head in specific positions to adjust your inner ear back to normal. Surgery may be needed in severe cases, but this is rare. In some cases, benign positional vertigo may resolve on its own in 2-4 weeks. HOME CARE INSTRUCTIONS Safety  Move slowly.Avoid sudden body or head movements.  Avoid driving.  Avoid operating heavy machinery.  Avoid doing any tasks that would be dangerous to you or others if a vertigo episode would occur.  If you have trouble walking or keeping your balance, try using a cane for stability. If you feel dizzy or unstable, sit down right away.  Return to your normal activities as told by your health care provider. Ask your health care provider what activities are safe for you.  General Instructions  Take over-the-counter and prescription medicines only as told by your health care provider.  Avoid certain positions or movements as told by your health care provider.  Drink enough fluid to keep your urine clear or pale yellow.  Keep all follow-up visits as told by your health care  provider. This is important. SEEK MEDICAL CARE IF:  You have a fever.  Your condition gets worse or you develop new symptoms.  Your family or friends notice any behavioral changes.  Your nausea or vomiting gets worse.  You have numbness or a "pins and needles" sensation. SEEK IMMEDIATE MEDICAL CARE IF:  You have difficulty speaking or moving.  You are always dizzy.  You faint.  You develop severe headaches.  You have weakness in your legs or arms.  You have changes in your hearing or vision.  You develop a stiff neck.  You develop sensitivity to light.   This information is not intended to replace advice given to you by your health care provider. Make sure you discuss any questions you have with your health care provider.   Document Released: 01/08/2006 Document Revised: 12/22/2014 Document Reviewed: 07/26/2014 Elsevier Interactive Patient Education [employer].

## 2015-01-25 NOTE — Progress Notes (Signed)
Pre visit review using our clinic review tool, if applicable. No additional management support is needed unless otherwise documented below in the visit note. 

## 2015-01-26 MED ORDER — VALACYCLOVIR HCL 1 G PO TABS: ORAL_TABLET | ORAL | Status: DC

## 2015-01-26 NOTE — Addendum Note (Signed)
Addended by: Amado Coe on: 01/26/2015 02:38 PM   Modules accepted: Orders

## 2015-01-26 NOTE — Telephone Encounter (Signed)
Valtrex sent to pharmacy.  Patient aware.

## 2015-02-09 ENCOUNTER — Ambulatory Visit (INDEPENDENT_AMBULATORY_CARE_PROVIDER_SITE_OTHER): Payer: Medicare Other | Admitting: Family Medicine

## 2015-02-09 ENCOUNTER — Encounter: Payer: Self-pay | Admitting: Family Medicine

## 2015-02-09 VITALS — BP 140/60 | HR 74 | Temp 98.0°F | Wt 145.5 lb

## 2015-02-09 DIAGNOSIS — I10 Essential (primary) hypertension: Secondary | ICD-10-CM

## 2015-02-09 DIAGNOSIS — Z23 Encounter for immunization: Secondary | ICD-10-CM | POA: Diagnosis not present

## 2015-02-09 DIAGNOSIS — E038 Other specified hypothyroidism: Secondary | ICD-10-CM | POA: Diagnosis not present

## 2015-02-09 DIAGNOSIS — R42 Dizziness and giddiness: Secondary | ICD-10-CM | POA: Diagnosis not present

## 2015-02-09 NOTE — Patient Instructions (Signed)

## 2015-02-09 NOTE — Progress Notes (Signed)
   Subjective:    Patient ID: [name], female    DOB: April 02, 1941, 74 y.o.   MRN: [id number]  HPI Patient seen for follow-up vertigo. Refer to previous note. Suspected benign peripheral positional vertigo. She states she is about 50% better. She thinks her symptoms are worse with head turned to the right. She's not had any syncope. No consistent headaches. No focal weakness. She was apparently referred to physical therapy but has not heard back yet she took some Antivert which helped slightly  She has hypertension which is been fairly well controlled. She has hypothyroidism with normal TSH last spring. She's had some mild hair loss. No fatigue issues. No recent palpitations.  Past Medical History  Diagnosis Date  . Anemia   . GERD (gastroesophageal reflux disease)   . Headache(784.0)   . Ulcer   . Kidney stones   . Rheumatic fever     age 16  . UTI (lower urinary tract infection)   . Ulnar neuropathy   . Osteopenia   . Hyperlipidemia   . Thyroid disease     Hypothyroid  . Osteoporosis   . Atrophic vaginitis   . Hypertension   . Endometriosis    Past Surgical History  Procedure Laterality Date  . Laparoscopic endometriosis fulguration  1978  . Ulnar nerve repair  2010  . Tonsillectomy    . Pelvic laparoscopy      reports that she has never smoked. She does not have any smokeless tobacco history on file. She reports that she does not drink alcohol. Her drug history is not on file. family history includes Breast cancer in her mother and paternal grandmother; Colon cancer in her father; Diabetes in her paternal grandmother; Heart disease in her father and maternal grandmother; Hypertension in her mother; Stroke in her father. Allergies  Allergen Reactions  . Nitrofurantoin     REACTION: GI upset  . Sulfamethoxazole-Trimethoprim     REACTION: GI upset       Review of Systems  Constitutional: Negative for fatigue.  Eyes: Negative for visual disturbance.    Respiratory: Negative for cough, chest tightness, shortness of breath and wheezing.   Cardiovascular: Negative for chest pain, palpitations and leg swelling.  Neurological: Positive for dizziness. Negative for seizures, syncope, weakness, light-headedness and headaches.  Psychiatric/Behavioral: Negative for confusion.       Objective:   Physical Exam  Constitutional: She is oriented to person, place, and time. She appears well-developed and well-nourished. No distress.  Neck: Neck supple. No JVD present. No thyromegaly present.  Cardiovascular: Normal rate and regular rhythm.   Pulmonary/Chest: Effort normal and breath sounds normal. No respiratory distress. She has no wheezes. She has no rales.  Musculoskeletal: She exhibits no edema.  Neurological: She is alert and oriented to person, place, and time. No cranial nerve deficit. Coordination normal.  No focal weakness. No nystagmus. Normal cerebellar testing.          Assessment & Plan:  Intermittent vertigo. Overall improved but not resolved. Suspect benign peripheral positional vertigo. Set up vestibular rehabilitation  Hypertension. Stable. Continue current medications  Hypothyroidism. TSH in April at goal

## 2015-02-09 NOTE — Progress Notes (Signed)
Pre visit review using our clinic review tool, if applicable. No additional management support is needed unless otherwise documented below in the visit note. 

## 2015-02-21 DIAGNOSIS — H8113 Benign paroxysmal vertigo, bilateral: Secondary | ICD-10-CM | POA: Diagnosis not present

## 2015-03-01 DIAGNOSIS — H8113 Benign paroxysmal vertigo, bilateral: Secondary | ICD-10-CM | POA: Diagnosis not present

## 2015-03-22 DIAGNOSIS — H8113 Benign paroxysmal vertigo, bilateral: Secondary | ICD-10-CM | POA: Diagnosis not present

## 2015-03-31 DIAGNOSIS — H8113 Benign paroxysmal vertigo, bilateral: Secondary | ICD-10-CM | POA: Diagnosis not present

## 2015-04-04 ENCOUNTER — Other Ambulatory Visit: Payer: Self-pay | Admitting: Family Medicine

## 2015-04-05 ENCOUNTER — Other Ambulatory Visit: Payer: Self-pay | Admitting: Family Medicine

## 2015-04-05 MED ORDER — ZOLPIDEM TARTRATE 5 MG PO TABS: 5.0000 mg | ORAL_TABLET | Freq: Every evening | ORAL | Status: DC | PRN

## 2015-05-23 ENCOUNTER — Other Ambulatory Visit: Payer: Self-pay | Admitting: Family Medicine

## 2015-05-23 DIAGNOSIS — Z1231 Encounter for screening mammogram for malignant neoplasm of breast: Secondary | ICD-10-CM

## 2015-06-14 ENCOUNTER — Ambulatory Visit: Payer: Medicare Other

## 2015-07-10 ENCOUNTER — Encounter: Payer: Self-pay | Admitting: Family Medicine

## 2015-07-11 ENCOUNTER — Other Ambulatory Visit: Payer: Self-pay | Admitting: Family Medicine

## 2015-07-11 MED ORDER — ZOLPIDEM TARTRATE 5 MG PO TABS: 5.0000 mg | ORAL_TABLET | Freq: Every evening | ORAL | Status: DC | PRN

## 2015-07-29 ENCOUNTER — Ambulatory Visit
Admission: RE | Admit: 2015-07-29 | Discharge: 2015-07-29 | Disposition: A | Discharge status: Home / Self Care | Payer: Medicare Other | Source: Ambulatory Visit | Attending: Family Medicine | Admitting: Family Medicine

## 2015-07-29 DIAGNOSIS — Z1231 Encounter for screening mammogram for malignant neoplasm of breast: Secondary | ICD-10-CM

## 2015-08-05 ENCOUNTER — Other Ambulatory Visit: Payer: Self-pay | Admitting: Family Medicine

## 2015-08-10 ENCOUNTER — Other Ambulatory Visit: Payer: Self-pay | Admitting: Family Medicine

## 2015-08-10 DIAGNOSIS — R928 Other abnormal and inconclusive findings on diagnostic imaging of breast: Secondary | ICD-10-CM

## 2015-08-12 ENCOUNTER — Other Ambulatory Visit: Payer: Self-pay

## 2015-08-12 ENCOUNTER — Other Ambulatory Visit: Payer: Self-pay | Admitting: Family Medicine

## 2015-08-12 DIAGNOSIS — R928 Other abnormal and inconclusive findings on diagnostic imaging of breast: Secondary | ICD-10-CM

## 2015-08-15 ENCOUNTER — Ambulatory Visit
Admission: RE | Admit: 2015-08-15 | Discharge: 2015-08-15 | Disposition: A | Discharge status: Home / Self Care | Payer: Medicare Other | Source: Ambulatory Visit | Attending: Family Medicine | Admitting: Family Medicine

## 2015-08-15 ENCOUNTER — Other Ambulatory Visit: Payer: Self-pay | Admitting: Family Medicine

## 2015-08-15 DIAGNOSIS — N63 Unspecified lump in breast: Secondary | ICD-10-CM | POA: Diagnosis not present

## 2015-08-15 DIAGNOSIS — R928 Other abnormal and inconclusive findings on diagnostic imaging of breast: Secondary | ICD-10-CM

## 2015-08-19 ENCOUNTER — Other Ambulatory Visit: Payer: Self-pay | Admitting: Family Medicine

## 2015-08-19 ENCOUNTER — Ambulatory Visit
Admission: RE | Admit: 2015-08-19 | Discharge: 2015-08-19 | Disposition: A | Discharge status: Home / Self Care | Payer: Medicare Other | Source: Ambulatory Visit | Attending: Family Medicine | Admitting: Family Medicine

## 2015-08-19 DIAGNOSIS — N63 Unspecified lump in breast: Secondary | ICD-10-CM | POA: Diagnosis not present

## 2015-08-19 DIAGNOSIS — R928 Other abnormal and inconclusive findings on diagnostic imaging of breast: Secondary | ICD-10-CM

## 2015-08-19 DIAGNOSIS — C50412 Malignant neoplasm of upper-outer quadrant of left female breast: Secondary | ICD-10-CM | POA: Diagnosis not present

## 2015-08-19 HISTORY — PX: BREAST BIOPSY: SHX20

## 2015-08-22 ENCOUNTER — Ambulatory Visit
Admission: RE | Admit: 2015-08-22 | Discharge: 2015-08-22 | Disposition: A | Discharge status: Home / Self Care | Payer: Medicare Other | Source: Ambulatory Visit | Attending: Family Medicine | Admitting: Family Medicine

## 2015-08-22 DIAGNOSIS — N63 Unspecified lump in breast: Secondary | ICD-10-CM | POA: Diagnosis not present

## 2015-08-22 DIAGNOSIS — R928 Other abnormal and inconclusive findings on diagnostic imaging of breast: Secondary | ICD-10-CM

## 2015-08-29 ENCOUNTER — Ambulatory Visit: Payer: Self-pay | Admitting: General Surgery

## 2015-08-29 DIAGNOSIS — C50412 Malignant neoplasm of upper-outer quadrant of left female breast: Secondary | ICD-10-CM

## 2015-08-29 NOTE — H&P (Signed)
[name] 08/29/2015 11:26 AM Location: [facility] Patient #: [id number] DOB: 1940/05/01 Married / Language: [name] / Race: White Female  History of Present Illness [name]; 08/29/2015 12:16 PM) The patient is a 75 year old female.   Note:She is referred by Dr. 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 She underwent a screening mammogram and an abnormal area was noted in the upper outer quadrant of the left breast. This was a spiculated lesion measuring 8-9 mm. Ultrasound of left axilla was negative. Image guided biopsy was performed demonstrating an invasive mammary cancer, ER/PR positive, HER-2 negative, Ki67 = 10%. She has no complaints about her breast prior to the biopsy. Her mother had breast cancer at the age of 43. Her paternal great grandmother also had breast cancer. Age at menarche was 67. She's not had any children. Age of menopause was before 27. She is here today with her husband.  Other Problems Elbert Ewings, CMA; 08/29/2015 11:26 AM) Breast Cancer Gastric Ulcer Gastroesophageal Reflux Disease Heart murmur High blood pressure Hypercholesterolemia Lump In Breast Migraine Headache Thyroid Disease  Diagnostic Studies History Elbert Ewings, CMA; 08/29/2015 11:26 AM) Mammogram within last year  Allergies Elbert Ewings, CMA; 08/29/2015 11:27 AM) Nitrofurantoin *URINARY ANTI-INFECTIVES* Sulfa 10 *OPHTHALMIC AGENTS*  Medication History Elbert Ewings, CMA; 08/29/2015 11:30 AM) Venlafaxine HCl ER (150MG Capsule ER 24HR, Oral daily) Active. Atenolol (50MG Tablet, Oral) Active. Synthroid (50MCG Tablet, Oral) Active. HydroCHLOROthiazide (12.5MG Capsule, Oral) Active. Vitamin D (1000UNIT Tablet, Oral) Active. Omega 3 (1200MG Capsule, Oral) Active. Turmeric (450MG Capsule, Oral) Active. Centrum Silver (Oral) Active. Zegerid (20-1100MG Capsule, Oral half a tablet as needed) Active. Green  Tea Active. Medications Reconciled  Social History Elbert Ewings, [place]; 08/29/2015 11:26 AM) Alcohol use Occasional alcohol use. No caffeine use No drug use Tobacco use Never smoker.  Family History Elbert Ewings, [place]; 08/29/2015 11:26 AM) Cerebrovascular Accident Father. Colon Cancer Father. Heart Disease Father. Heart disease in female family member before age 65 Migraine Headache Family Members In General, Father. Rectal Cancer Father.  Pregnancy / Birth History Elbert Ewings, CMA; 08/29/2015 11:26 AM) Age at menarche 12 years. Age of menopause 35-50 Gravida 0 Para 0     Review of Systems Elbert Ewings CMA; 08/29/2015 11:26 AM) General Not Present- Appetite Loss, Chills, Fatigue, Fever, Night Sweats, Weight Gain and Weight Loss. Skin Not Present- Change in Wart/Mole, Dryness, Hives, Jaundice, New Lesions, Non-Healing Wounds, Rash and Ulcer. HEENT Present- Seasonal Allergies and Wears glasses/contact lenses. Not Present- Earache, Hearing Loss, Hoarseness, Nose Bleed, Oral Ulcers, Ringing in the Ears, Sinus Pain, Sore Throat, Visual Disturbances and Yellow Eyes. Respiratory Present- Snoring. Not Present- Bloody sputum, Chronic Cough, Difficulty Breathing and Wheezing. Breast Present- Breast Mass. Not Present- Breast Pain, Nipple Discharge and Skin Changes. Cardiovascular Not Present- Chest Pain, Difficulty Breathing Lying Down, Leg Cramps, Palpitations, Rapid Heart Rate, Shortness of Breath and Swelling of Extremities. Gastrointestinal Present- Constipation. Not Present- Abdominal Pain, Bloating, Bloody Stool, Change in Bowel Habits, Chronic diarrhea, Difficulty Swallowing, Excessive gas, Gets full quickly at meals, Hemorrhoids, Indigestion, Nausea, Rectal Pain and Vomiting. Female Genitourinary Present- Nocturia. Not Present- Frequency, Painful Urination, Pelvic Pain and Urgency. Musculoskeletal Not Present- Back Pain, Joint Pain, Joint Stiffness, Muscle Pain, Muscle  Weakness and Swelling of Extremities. Neurological Not Present- Decreased Memory, Fainting, Headaches, Numbness, Seizures, Tingling, Tremor, Trouble walking and Weakness. Psychiatric Present- Anxiety. Not Present- Bipolar, Change in Sleep Pattern, Depression, Fearful and Frequent crying. Hematology Not Present- Easy Bruising, Excessive  bleeding, Gland problems, HIV and Persistent Infections.  Vitals (Ashley Beck CMA; 08/29/2015 11:31 AM) 08/29/2015 11:30 AM Weight: 149.8 lb Height: 61in Body Surface Area: 1.67 m Body Mass Index: 28.3 kg/m  Temp.: 98.1F  Pulse: 88 (Regular)  BP: 132/80 (Sitting, Left Arm, Standard)      Physical Exam (Drayton Tieu J. Anabel Lykins MD; 08/29/2015 12:17 PM)  The physical exam findings are as follows: Note:General: WDWN in NAD. Pleasant and cooperative.  HEENT: [place]/AT, no facial masses  EYES: EOMI, no icterus  NECK: Supple, no obvious mass or thyroid enlargement.  CV: RRR.  CHEST: Breath sounds equal and clear. Respirations nonlabored.  BREASTS: Symmetrical in size. No dominant masses, nipple discharge or suspicious skin lesions. There is a small bruise in the left lateral breast.  ABDOMEN: Soft, nontender, nondistended, no masses, no organomegaly.  MUSCULOSKELETAL: FROM, good muscle tone, no edema, no venous stasis changes  LYMPHATIC: No palpable cervical, supraclavicular, axillary adenopathy.  SKIN: No jaundice.  NEUROLOGIC: Alert and oriented, answers questions appropriately, normal gait and station.  PSYCHIATRIC: Slightly anxious.    Assessment & Plan (Muhanad Torosyan J. Gertha Lichtenberg MD; 08/29/2015 12:18 PM)  MALIGNANT NEOPLASM OF UPPER-OUTER QUADRANT OF LEFT FEMALE BREAST (C50.412) Impression: He discussed surgical treatment options including breast conservation therapy, which I think she is a good candidate for, versus mastectomy. We discussed sentinel lymph node biopsy as well. I also recommended referral to medical oncology, radiation  oncology, and genetics. She agrees with these recommendations.  Plan: Referrals to medical oncology, radiation oncology, genetics. Following that, left breast lumpectomy after radioactive seed localization and left axillary sentinel lymph node biopsy. I have explained the procedure, risks, and aftercare. The risks include but are not limited to bleeding, infection, wound problems, seroma formation, cosmetic deformity, anesthesia, nerve injury, lymphedema. She seems to understand and agrees with the plan.  Dylana Shaw, MD 

## 2015-08-30 ENCOUNTER — Encounter: Payer: Self-pay | Admitting: Radiation Oncology

## 2015-08-30 ENCOUNTER — Encounter: Payer: Self-pay | Admitting: Oncology

## 2015-08-30 ENCOUNTER — Encounter: Payer: Self-pay | Admitting: Family Medicine

## 2015-08-31 ENCOUNTER — Ambulatory Visit (INDEPENDENT_AMBULATORY_CARE_PROVIDER_SITE_OTHER): Payer: Medicare Other | Admitting: Family Medicine

## 2015-08-31 VITALS — BP 130/70 | HR 88 | Temp 97.9°F | Ht 61.0 in | Wt 149.0 lb

## 2015-08-31 DIAGNOSIS — F418 Other specified anxiety disorders: Secondary | ICD-10-CM

## 2015-08-31 DIAGNOSIS — G4700 Insomnia, unspecified: Secondary | ICD-10-CM

## 2015-08-31 MED ORDER — ZOLPIDEM TARTRATE 5 MG PO TABS: ORAL_TABLET | ORAL | Status: DC

## 2015-08-31 NOTE — Patient Instructions (Signed)
Insomnia Insomnia is a sleep disorder that makes it difficult to fall asleep or to stay asleep. Insomnia can cause tiredness (fatigue), low energy, difficulty concentrating, mood swings, and poor performance at work or school.  There are three different ways to classify insomnia:  Difficulty falling asleep.  Difficulty staying asleep.  Waking up too early in the morning. Any type of insomnia can be long-term (chronic) or short-term (acute). Both are common. Short-term insomnia usually lasts for three months or less. Chronic insomnia occurs at least three times a week for longer than three months. CAUSES  Insomnia may be caused by another condition, situation, or substance, such as:  Anxiety.  Certain medicines.  Gastroesophageal reflux disease (GERD) or other gastrointestinal conditions.  Asthma or other breathing conditions.  Restless legs syndrome, sleep apnea, or other sleep disorders.  Chronic pain.  Menopause. This may include hot flashes.  Stroke.  Abuse of alcohol, tobacco, or illegal drugs.  Depression.  Caffeine.   Neurological disorders, such as Alzheimer disease.  An overactive thyroid (hyperthyroidism). The cause of insomnia may not be known. RISK FACTORS Risk factors for insomnia include:  Gender. Women are more commonly affected than men.  Age. Insomnia is more common as you get older.  Stress. This may involve your professional or personal life.  Income. Insomnia is more common in people with lower income.  Lack of exercise.   Irregular work schedule or night shifts.  Traveling between different time zones. SIGNS AND SYMPTOMS If you have insomnia, trouble falling asleep or trouble staying asleep is the main symptom. This may lead to other symptoms, such as:  Feeling fatigued.  Feeling nervous about going to sleep.  Not feeling rested in the morning.  Having trouble concentrating.  Feeling irritable, anxious, or depressed. TREATMENT   Treatment for insomnia depends on the cause. If your insomnia is caused by an underlying condition, treatment will focus on addressing the condition. Treatment may also include:   Medicines to help you sleep.  Counseling or therapy.  Lifestyle adjustments. HOME CARE INSTRUCTIONS   Take medicines only as directed by your health care provider.  Keep regular sleeping and waking hours. Avoid naps.  Keep a sleep diary to help you and your health care provider figure out what could be causing your insomnia. Include:   When you sleep.  When you wake up during the night.  How well you sleep.   How rested you feel the next day.  Any side effects of medicines you are taking.  What you eat and drink.   Make your bedroom a comfortable place where it is easy to fall asleep:  Put up shades or special blackout curtains to block light from outside.  Use a white noise machine to block noise.  Keep the temperature cool.   Exercise regularly as directed by your health care provider. Avoid exercising right before bedtime.  Use relaxation techniques to manage stress. Ask your health care provider to suggest some techniques that may work well for you. These may include:  Breathing exercises.  Routines to release muscle tension.  Visualizing peaceful scenes.  Cut back on alcohol, caffeinated beverages, and cigarettes, especially close to bedtime. These can disrupt your sleep.  Do not overeat or eat spicy foods right before bedtime. This can lead to digestive discomfort that can make it hard for you to sleep.  Limit screen use before bedtime. This includes:  Watching TV.  Using your smartphone, tablet, and computer.  Stick to a routine. This   can help you fall asleep faster. Try to do a quiet activity, brush your teeth, and go to bed at the same time each night.  Get out of bed if you are still awake after 15 minutes of trying to sleep. Keep the lights down, but try reading or  doing a quiet activity. When you feel sleepy, go back to bed.  Make sure that you drive carefully. Avoid driving if you feel very sleepy.  Keep all follow-up appointments as directed by your health care provider. This is important. SEEK MEDICAL CARE IF:   You are tired throughout the day or have trouble in your daily routine due to sleepiness.  You continue to have sleep problems or your sleep problems get worse. SEEK IMMEDIATE MEDICAL CARE IF:   You have serious thoughts about hurting yourself or someone else.   This information is not intended to replace advice given to you by your health care provider. Make sure you discuss any questions you have with your health care provider.   Document Released: 03/30/2000 Document Revised: 12/22/2014 Document Reviewed: 01/01/2014 Elsevier Interactive Patient Education 2016 [employer]  

## 2015-08-31 NOTE — Progress Notes (Signed)
   Subjective:    Patient ID: [name], female    DOB: 05-Jun-1940, 75 y.o.   MRN: [id number]  HPI Patient seen for follow-up regarding anxiety and insomnia. Recent diagnosis of breast cancer.  She has history of some baseline insomnia and  infrequently takes Ambien 5 mg. Even with that she's had some difficulty sleeping since her diagnosis. She naturally has had some increased anxiety. She has very supportive family.  Regarding insomnia, she has taken melatonin and Benadryl in the past without success. She's been for several years on Effexor XR 150 mg once daily for migraine prevention. This was started by neurology. No regular alcohol use . No late day use of caffeine. She avoids bright lights such as television and computer at night  Past Medical History  Diagnosis Date  . Anemia   . GERD (gastroesophageal reflux disease)   . Headache(784.0)   . Ulcer   . Kidney stones   . Rheumatic fever     age 21  . UTI (lower urinary tract infection)   . Ulnar neuropathy   . Osteopenia   . Hyperlipidemia   . Thyroid disease     Hypothyroid  . Osteoporosis   . Atrophic vaginitis   . Hypertension   . Endometriosis    Past Surgical History  Procedure Laterality Date  . Laparoscopic endometriosis fulguration  1978  . Ulnar nerve repair  2010  . Tonsillectomy    . Pelvic laparoscopy      reports that she has never smoked. She does not have any smokeless tobacco history on file. She reports that she does not drink alcohol. Her drug history is not on file. family history includes Breast cancer in her mother and paternal grandmother; Colon cancer in her father; Diabetes in her paternal grandmother; Heart disease in her father and maternal grandmother; Hypertension in her mother; Stroke in her father. Allergies  Allergen Reactions  . Nitrofurantoin     REACTION: GI upset  . Sulfamethoxazole-Trimethoprim     REACTION: GI upset      Review of Systems  Respiratory: Negative for  shortness of breath.   Cardiovascular: Negative for chest pain.  Psychiatric/Behavioral: Positive for sleep disturbance. Negative for agitation. The patient is nervous/anxious.        Objective:   Physical Exam  Constitutional: She appears well-developed and well-nourished.  Cardiovascular: Normal rate and regular rhythm.   Pulmonary/Chest: Effort normal and breath sounds normal. No respiratory distress. She has no wheezes. She has no rales.  Psychiatric: She has a normal mood and affect. Her behavior is normal. Judgment and thought content normal.          Assessment & Plan:  Insomnia related to situational stress with recent diagnosis of cancer. Sleep hygiene discussed with handout given. Would be reluctant to change her Effexor XR at this point. She's tried melatonin and Benadryl without relief. Would be cautious with drugs like trazodone with Effexor and also she is not a candidate for SSRI for anxiety symptoms. We recommended against benzodiazepine regular usage. Refilled Ambien 5 mg 1-2 daily at bedtime as needed. Suggest consider behavioral health counseling with Ebony Hail here to our clinic and she will consider setting this up  [name] MD [facility]

## 2015-08-31 NOTE — Progress Notes (Signed)
Pre visit review using our clinic review tool, if applicable. No additional management support is needed unless otherwise documented below in the visit note. 

## 2015-09-01 ENCOUNTER — Telehealth: Payer: Self-pay | Admitting: *Deleted

## 2015-09-01 ENCOUNTER — Encounter: Payer: Self-pay | Admitting: Genetic Counselor

## 2015-09-01 NOTE — Telephone Encounter (Signed)
Received appt date/time from Audie Clear.  Mailed new pt packet to pt.

## 2015-09-01 NOTE — Progress Notes (Signed)
Location of Breast Cancer: Left Breast  Histology per Pathology Report:  08/19/15 Diagnosis Breast, left, needle core biopsy, 2:30 o'clock mass - INVASIVE MAMMARY CARCINOMA.  Receptor Status: ER(100%), PR (100%), Her2-neu (NEG), Ki-(10%)  Did patient present with symptoms or was this found on screening mammography?: It was found on a screening mammogram.   Past/Anticipated interventions by surgeon, if any: Dr. Zella Richer has a left breast lumpectomy after radioactive seed localization and left axillary sentinel lymph node biopsy planned for 09/09/15.  Past/Anticipated interventions by medical oncology, if any:  She has an appointment with Dr. [name] today (09/07/15) at 4:30.    Pain issues, if any:  No  SAFETY ISSUES:  Prior radiation? No, she does report having Rheumatic Fever when she was 10. She remembers being in a room where there was radiation, but is not sure if she received any.   Pacemaker/ICD?  No  Possible current pregnancy? No  Is the patient on methotrexate? No  Current Complaints / other details:   Her mother had breast cancer at the age of 65.  Her paternal great grandmother also had breast cancer.  Age at menarche was 18.  She's not had any children.  Age of menopause was before 81.   BP 157/68 mmHg  Pulse 67  Temp(Src) 97.6 F (36.4 C)  Ht 5' 1"  (1.549 m)  Wt 150 lb 4.8 oz (68.176 kg)  BMI 28.41 kg/m2    Modell Fendrick, Stephani Police, RN 09/01/2015,3:32 PM

## 2015-09-02 ENCOUNTER — Other Ambulatory Visit: Payer: Self-pay | Admitting: General Surgery

## 2015-09-02 DIAGNOSIS — C50412 Malignant neoplasm of upper-outer quadrant of left female breast: Secondary | ICD-10-CM

## 2015-09-05 ENCOUNTER — Telehealth: Payer: Self-pay | Admitting: *Deleted

## 2015-09-05 ENCOUNTER — Other Ambulatory Visit: Payer: Self-pay

## 2015-09-05 ENCOUNTER — Encounter (HOSPITAL_COMMUNITY)
Admission: RE | Admit: 2015-09-05 | Discharge: 2015-09-05 | Disposition: A | Discharge status: Home / Self Care | Payer: Medicare Other | Source: Ambulatory Visit | Attending: General Surgery | Admitting: General Surgery

## 2015-09-05 ENCOUNTER — Ambulatory Visit (HOSPITAL_COMMUNITY)
Admission: RE | Admit: 2015-09-05 | Discharge: 2015-09-05 | Disposition: A | Discharge status: Home / Self Care | Payer: Medicare Other | Source: Ambulatory Visit | Attending: General Surgery | Admitting: General Surgery

## 2015-09-05 ENCOUNTER — Encounter (HOSPITAL_COMMUNITY): Payer: Self-pay

## 2015-09-05 DIAGNOSIS — Z01812 Encounter for preprocedural laboratory examination: Secondary | ICD-10-CM | POA: Insufficient documentation

## 2015-09-05 DIAGNOSIS — C50919 Malignant neoplasm of unspecified site of unspecified female breast: Secondary | ICD-10-CM | POA: Diagnosis not present

## 2015-09-05 DIAGNOSIS — Z01818 Encounter for other preprocedural examination: Secondary | ICD-10-CM | POA: Diagnosis not present

## 2015-09-05 HISTORY — DX: Anxiety disorder, unspecified: F41.9

## 2015-09-05 HISTORY — DX: Malignant (primary) neoplasm, unspecified: C80.1

## 2015-09-05 HISTORY — DX: Unspecified osteoarthritis, unspecified site: M19.90

## 2015-09-05 HISTORY — DX: Hypothyroidism, unspecified: E03.9

## 2015-09-05 LAB — CBC WITH DIFFERENTIAL/PLATELET
Basophils Absolute: 0.1 10*3/uL (ref 0.0–0.1)
Basophils Relative: 1 %
Eosinophils Absolute: 0.5 10*3/uL (ref 0.0–0.7)
Eosinophils Relative: 5 %
HCT: 45.5 % (ref 36.0–46.0)
Hemoglobin: 14.7 g/dL (ref 12.0–15.0)
Lymphocytes Relative: 24 %
Lymphs Abs: 2.1 10*3/uL (ref 0.7–4.0)
MCH: 27.6 pg (ref 26.0–34.0)
MCHC: 32.3 g/dL (ref 30.0–36.0)
MCV: 85.5 fL (ref 78.0–100.0)
Monocytes Absolute: 1.1 10*3/uL — ABNORMAL HIGH (ref 0.1–1.0)
Monocytes Relative: 12 %
Neutro Abs: 5.2 10*3/uL (ref 1.7–7.7)
Neutrophils Relative %: 58 %
Platelets: 252 10*3/uL (ref 150–400)
RBC: 5.32 MIL/uL — ABNORMAL HIGH (ref 3.87–5.11)
RDW: 13.3 % (ref 11.5–15.5)
WBC: 8.9 10*3/uL (ref 4.0–10.5)

## 2015-09-05 LAB — COMPREHENSIVE METABOLIC PANEL
ALT: 23 U/L (ref 14–54)
AST: 24 U/L (ref 15–41)
Albumin: 4.5 g/dL (ref 3.5–5.0)
Alkaline Phosphatase: 65 U/L (ref 38–126)
Anion gap: 8 (ref 5–15)
BUN: 22 mg/dL — ABNORMAL HIGH (ref 6–20)
CO2: 29 mmol/L (ref 22–32)
Calcium: 10.1 mg/dL (ref 8.9–10.3)
Chloride: 103 mmol/L (ref 101–111)
Creatinine, Ser: 1.22 mg/dL — ABNORMAL HIGH (ref 0.44–1.00)
GFR calc Af Amer: 49 mL/min — ABNORMAL LOW (ref >60)
GFR calc non Af Amer: 42 mL/min — ABNORMAL LOW (ref >60)
Glucose, Bld: 103 mg/dL — ABNORMAL HIGH (ref 65–99)
Potassium: 4.4 mmol/L (ref 3.5–5.1)
Sodium: 140 mmol/L (ref 135–145)
Total Bilirubin: 0.6 mg/dL (ref 0.3–1.2)
Total Protein: 7.4 g/dL (ref 6.5–8.1)

## 2015-09-05 NOTE — Telephone Encounter (Signed)
  Oncology Nurse Navigator Documentation  Navigator Location: [facility] (09/05/15 1200)     Abnormal Finding Date: 08/15/15 (09/05/15 1200) Confirmed Diagnosis Date: 08/19/15 (09/05/15 1200) Surgery Date: 09/09/15 (09/05/15 1200) Treatment Initiated Date: 09/09/15 (09/05/15 1200)

## 2015-09-05 NOTE — Pre-Procedure Instructions (Signed)
    REX HENSEN  09/05/2015      [facility] [id number], [place] [id number] [address] [place] [id number] Phone: [phone] Fax: [phone]    Your procedure is scheduled on 09-09-2015  Friday .  Report to [facility] Admitting at 10:25 A.M.   Call this number if you have problems the morning of surgery:  [phone]   Remember:  Do not eat food or drink liquids after midnight.   Take these medicines the morning of surgery with A SIP OF WATER Atenolol(Tenormin),levothyroxine(Synthroid).Zegerid,                Stop all aspirin,ibuprofen,advil,aleve,Goody Powders,Excedrin,fish oil and all over the counter herbal Supplements starting                today   Do not wear jewelry, make-up or nail polish.  Do not wear lotions, powders, or perfumes.  You may NOT wear deodorant.  Do not shave 48 hours prior to surgery.     Do not bring valuables to the hospital.  [facility] is not responsible for any belongings or valuables.  Contacts, dentures or bridgework may not be worn into surgery.  Leave your suitcase in the car.  After surgery it may be brought to your room.  For patients admitted to the hospital, discharge time will be determined by your treatment team.  Patients discharged the day of surgery will not be allowed to drive home.    Special instructions:  See attached Sheet for instructions on [facility] showers  Please read over the following fact sheets that you were given. Surgical Site Infection Prevention

## 2015-09-07 ENCOUNTER — Other Ambulatory Visit (HOSPITAL_BASED_OUTPATIENT_CLINIC_OR_DEPARTMENT_OTHER): Payer: Medicare Other

## 2015-09-07 ENCOUNTER — Ambulatory Visit (HOSPITAL_BASED_OUTPATIENT_CLINIC_OR_DEPARTMENT_OTHER): Payer: Medicare Other | Admitting: Oncology

## 2015-09-07 ENCOUNTER — Ambulatory Visit (HOSPITAL_BASED_OUTPATIENT_CLINIC_OR_DEPARTMENT_OTHER): Payer: Medicare Other | Admitting: Genetic Counselor

## 2015-09-07 ENCOUNTER — Ambulatory Visit
Admission: RE | Admit: 2015-09-07 | Discharge: 2015-09-07 | Disposition: A | Discharge status: Home / Self Care | Payer: Medicare Other | Source: Ambulatory Visit | Attending: Radiation Oncology | Admitting: Radiation Oncology

## 2015-09-07 ENCOUNTER — Encounter: Payer: Self-pay | Admitting: Radiation Oncology

## 2015-09-07 ENCOUNTER — Encounter: Payer: Self-pay | Admitting: Genetic Counselor

## 2015-09-07 VITALS — BP 153/54 | HR 68 | Temp 98.6°F | Resp 18 | Ht 61.0 in | Wt 149.0 lb

## 2015-09-07 VITALS — BP 157/68 | HR 67 | Temp 97.6°F | Ht 61.0 in | Wt 150.3 lb

## 2015-09-07 DIAGNOSIS — C50919 Malignant neoplasm of unspecified site of unspecified female breast: Secondary | ICD-10-CM | POA: Insufficient documentation

## 2015-09-07 DIAGNOSIS — K219 Gastro-esophageal reflux disease without esophagitis: Secondary | ICD-10-CM | POA: Insufficient documentation

## 2015-09-07 DIAGNOSIS — E079 Disorder of thyroid, unspecified: Secondary | ICD-10-CM | POA: Insufficient documentation

## 2015-09-07 DIAGNOSIS — I10 Essential (primary) hypertension: Secondary | ICD-10-CM | POA: Diagnosis not present

## 2015-09-07 DIAGNOSIS — C50412 Malignant neoplasm of upper-outer quadrant of left female breast: Secondary | ICD-10-CM | POA: Insufficient documentation

## 2015-09-07 DIAGNOSIS — F419 Anxiety disorder, unspecified: Secondary | ICD-10-CM | POA: Insufficient documentation

## 2015-09-07 DIAGNOSIS — E785 Hyperlipidemia, unspecified: Secondary | ICD-10-CM | POA: Insufficient documentation

## 2015-09-07 DIAGNOSIS — Z800 Family history of malignant neoplasm of digestive organs: Secondary | ICD-10-CM | POA: Diagnosis not present

## 2015-09-07 DIAGNOSIS — Z803 Family history of malignant neoplasm of breast: Secondary | ICD-10-CM

## 2015-09-07 DIAGNOSIS — E039 Hypothyroidism, unspecified: Secondary | ICD-10-CM | POA: Insufficient documentation

## 2015-09-07 DIAGNOSIS — D649 Anemia, unspecified: Secondary | ICD-10-CM | POA: Diagnosis not present

## 2015-09-07 DIAGNOSIS — M8580 Other specified disorders of bone density and structure, unspecified site: Secondary | ICD-10-CM | POA: Insufficient documentation

## 2015-09-07 DIAGNOSIS — Z87442 Personal history of urinary calculi: Secondary | ICD-10-CM | POA: Insufficient documentation

## 2015-09-07 DIAGNOSIS — Z170 Estrogen receptor positive status [ER+]: Secondary | ICD-10-CM | POA: Insufficient documentation

## 2015-09-07 LAB — CBC WITH DIFFERENTIAL/PLATELET
BASO%: 1.4 % (ref 0.0–2.0)
Basophils Absolute: 0.1 10*3/uL (ref 0.0–0.1)
EOS%: 6.5 % (ref 0.0–7.0)
Eosinophils Absolute: 0.5 10*3/uL (ref 0.0–0.5)
HCT: 44.6 % (ref 34.8–46.6)
HGB: 14.7 g/dL (ref 11.6–15.9)
LYMPH%: 27.5 % (ref 14.0–49.7)
MCH: 28.2 pg (ref 25.1–34.0)
MCHC: 32.9 g/dL (ref 31.5–36.0)
MCV: 85.7 fL (ref 79.5–101.0)
MONO#: 0.8 10*3/uL (ref 0.1–0.9)
MONO%: 10.1 % (ref 0.0–14.0)
NEUT#: 4.6 10*3/uL (ref 1.5–6.5)
NEUT%: 54.5 % (ref 38.4–76.8)
Platelets: 260 10*3/uL (ref 145–400)
RBC: 5.2 10*6/uL (ref 3.70–5.45)
RDW: 14 % (ref 11.2–14.5)
WBC: 8.4 10*3/uL (ref 3.9–10.3)
lymph#: 2.3 10*3/uL (ref 0.9–3.3)

## 2015-09-07 LAB — COMPREHENSIVE METABOLIC PANEL
ALT: 20 U/L (ref 0–55)
AST: 20 U/L (ref 5–34)
Albumin: 4.4 g/dL (ref 3.5–5.0)
Alkaline Phosphatase: 68 U/L (ref 40–150)
Anion Gap: 10 mEq/L (ref 3–11)
BUN: 26.5 mg/dL — ABNORMAL HIGH (ref 7.0–26.0)
CO2: 29 mEq/L (ref 22–29)
Calcium: 10 mg/dL (ref 8.4–10.4)
Chloride: 105 mEq/L (ref 98–109)
Creatinine: 1.3 mg/dL — ABNORMAL HIGH (ref 0.6–1.1)
EGFR: 39 mL/min/{1.73_m2} — ABNORMAL LOW (ref >90)
Glucose: 113 mg/dl (ref 70–140)
Potassium: 4.1 mEq/L (ref 3.5–5.1)
Sodium: 143 mEq/L (ref 136–145)
Total Bilirubin: 0.34 mg/dL (ref 0.20–1.20)
Total Protein: 7.7 g/dL (ref 6.4–8.3)

## 2015-09-07 NOTE — Progress Notes (Signed)
REFERRING PROVIDER: [name], MD [place], [place] [address]   Lurline Del, MD  PRIMARY PROVIDER:  [name], MD  PRIMARY REASON FOR VISIT:  1. Family history of colon cancer   2. Family history of breast cancer   3. Malignant neoplasm of female breast, unspecified laterality, unspecified site of breast ([place])      HISTORY OF PRESENT ILLNESS:   Ms. Wayment, a 75 y.o. female, was seen for a [facility] cancer genetics consultation at the request of Dr. Zella Richer due to a personal and family history of cancer.  Ms. Bruhn presents to clinic today to discuss the possibility of a hereditary predisposition to cancer, genetic testing, and to further clarify her future cancer risks, as well as potential cancer risks for family members.   In 2017, at the age of 53, Ms. 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 This will be treated with with a lumpectomy on Friday.  She will meet with radiation oncology and medical oncology today.    CANCER HISTORY:   No history exists.     HORMONAL RISK FACTORS:  Menarche was at age 24.  First live birth at age N/A.  OCP use for approximately 0 years.  Ovaries intact: yes.  Hysterectomy: no.  Menopausal status: postmenopausal.  HRT use: 0 years. Colonoscopy: yes; every 5 years based on her father's colon cancer, always normal . Mammogram within the last year: yes. Number of breast biopsies: 1. Up to date with pelvic exams:  yes. Any excessive radiation exposure in the past:  no  Past Medical History  Diagnosis Date  . Anemia   . GERD (gastroesophageal reflux disease)   . Headache(784.0)   . Ulcer   . Kidney stones   . Rheumatic fever     age 60  . UTI (lower urinary tract infection)   . Osteopenia   . Hyperlipidemia   . Thyroid disease     Hypothyroid  . Osteoporosis   . Atrophic vaginitis   . Hypertension   . Endometriosis   .  Hypothyroidism   . Anxiety   . Arthritis   . Cancer [facility])     breast cancer  . Bladder cancer ([place])   . Family history of breast cancer   . Family history of colon cancer     Past Surgical History  Procedure Laterality Date  . Laparoscopic endometriosis fulguration  1978  . Ulnar nerve repair  2010  . Tonsillectomy    . Pelvic laparoscopy      Social History   Social History  . Marital Status: Married    Spouse Name: N/A  . Number of Children: 0  . Years of Education: N/A   Social History Main Topics  . Smoking status: Never Smoker   . Smokeless tobacco: None  . Alcohol Use: No  . Drug Use: No  . Sexual Activity: No   Other Topics Concern  . None   Social History Narrative     FAMILY HISTORY:  We obtained a detailed, 4-generation family history.  Significant diagnoses are listed below: Family History  Problem Relation Age of Onset  . Breast cancer Mother     Age 1  . Hypertension Mother   . Heart disease Father   . Stroke Father   . Colon cancer Father     dx 2s  . Breast cancer Paternal Grandmother     Age 80  .  Diabetes Paternal Grandmother   . Heart disease Maternal Grandmother     The patient does not have children.  She has two brothers who are healthy and cancer free.  Her mother was diagnosed with breast cancer at age 84 and 87 and died at 64. Her father was diagnosed with colon cancer in his 59s and died at 79.  Her mother had one brother and one sister who were cancer free when they died.  The patient's maternal grandparents died at age 58 and 64.  The patient's father had two sisters and one brother.  His brother's two granddaughters had breast cancer.  His mother was also diagnosed with breast cancer at age 21 and his father died at 21.  Patient's maternal ancestors are of [place] and [place] descent, and paternal ancestors are of [place] descent. There is no reported Ashkenazi Jewish ancestry. There is no known consanguinity.  GENETIC COUNSELING  ASSESSMENT: JAHZARIA VARY is a 75 y.o. female with a personal and family history of breast cancer which is somewhat suggestive of a hereditary cancer syndrome and predisposition to cancer. We, therefore, discussed and recommended the following at today's visit.   DISCUSSION: We discussed that about 5-10% of breast cancer is due to a hereditary cancer syndrome, most commonly associated with BRCA mutations.  Other genes associated with an increased risk for breast cancer include PALB2, ATM and CHEK2.  We reviewed the characteristics, features and inheritance patterns of hereditary cancer syndromes. We also discussed genetic testing, including the appropriate family members to test, the process of testing, insurance coverage and turn-around-time for results. We discussed the implications of a negative, positive and/or variant of uncertain significant result. We recommended Ms. Poss pursue genetic testing for the breast/[place] cancer gene panel. The Breast/Ovarian gene panel offered by [facility] includes sequencing and rearrangement analysis for the following 20 genes:  ATM, BARD1, BRCA1, BRCA2, BRIP1, CDH1, CHEK2, EPCAM, FANCC, MLH1, MSH2, MSH6, NBN, PALB2, PMS2, PTEN, RAD51C, RAD51D, TP53, and XRCC2.     Based on Ms. Pabst's personal and family history of cancer, she meets medical criteria for genetic testing. Despite that she meets criteria, she may still have an out of pocket cost. We discussed that if her out of pocket cost for testing is over $100, the laboratory will call and confirm whether she wants to proceed with testing.  If the out of pocket cost of testing is less than $100 she will be billed by the genetic testing laboratory.   PLAN: After considering the risks, benefits, and limitations, Ms. Gertsch  provided informed consent to pursue genetic testing and the blood sample was sent to [facility] for analysis of the Breast/Ovarian cancer panel. Results should be available within  approximately 2-3 weeks' time, at which point they will be disclosed by telephone to Ms. Boerner, as will any additional recommendations warranted by these results. Ms. Dimitrov will receive a summary of her genetic counseling visit and a copy of her results once available. This information will also be available in Epic. We encouraged Ms. Paulus to remain in contact with cancer genetics annually so that we can continuously update the family history and inform her of any changes in cancer genetics and testing that may be of benefit for her family. Ms. Struthers questions were answered to her satisfaction today. Our contact information was provided should additional questions or concerns arise.  Lastly, we encouraged Ms. Jayson to remain in contact with cancer genetics annually so that we can continuously update the family  history and inform her of any changes in cancer genetics and testing that may be of benefit for this family.   Ms.  Sou questions were answered to her satisfaction today. Our contact information was provided should additional questions or concerns arise. Thank you for the referral and allowing [place] to share in the care of your patient.   Rivaan Kendall P. Florene Glen, [place], [facility] Certified Genetic Counselor [email or web address] phone: [phone]  The patient was seen for a total of 40 minutes in face-to-face genetic counseling.  This patient was discussed with Drs. [name], Lindi Adie and/or Burr Medico who agrees with the above.    _______________________________________________________________________ For Office Staff:  Number of people involved in session: 2 Was an Intern/ student involved with case: no

## 2015-09-07 NOTE — Progress Notes (Signed)
[place]  Telephone:([phone] Fax:(336) [phone]     ID: NAKIESHA RUMSEY DOB: April 26, 1940  MR#: [id number]  TDV#:[id number]  Patient Care Team: [name], MD as PCP - General Chauncey Cruel, MD as Consulting Physician (Oncology) Chauncey Cruel, MD as Consulting Physician (Oncology) Jackolyn Confer, MD as Consulting Physician (General Surgery) Eppie Gibson, MD as Attending Physician (Radiation Oncology) Richmond Campbell, MD as Consulting Physician (Gastroenterology) Lavonna Monarch, MD as Consulting Physician (Dermatology) PCP: [name], MD OTHER MD:  CHIEF COMPLAINT: Estrogen receptor positive breast cancer  CURRENT TREATMENT: Awaiting definitive surgery   BREAST CANCER HISTORY: Shali had bilateral screening mammography with tomography scheduled routinely 08/01/2015 at the [facility]. This showed a possible mass in the left breast. Accordingly on 08/15/2015 she underwent left diagnostic mammography with tomography and ultrasonography. This found the breast density to be category be. On spot compression views and was a 0.9 cm spiculated mass in the upper outer left breast, which on ultrasound was at 2:30 o'clock 5 cm from the nipple and measured 0.8 cm. Ultrasound of the axilla was benign.  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  Her subsequent history is as detailed below    INTERVAL HISTORY: Maryah was evaluated in the breast clinic 09/07/2015 accompanied by her husband [name].   REVIEW OF SYSTEMS: There were no specific symptoms leading to the original mammogram, which was routinely scheduled. The patient denies unusual headaches,  visual changes, nausea, vomiting, stiff neck, dizziness, or gait imbalance. There has been no cough, phlegm production, or pleurisy, no chest pain or pressure, and no change in bowel or bladder habits. The patient denies fever, rash, bleeding, unexplained fatigue or unexplained weight loss. Rosalie does have a history of migraines which are easily controlled on her current medications. She complains of some hearing loss, some shortness of breath when walking upstairs (but she can make it to the top without stopping), and mild heartburn. A detailed review of systems was otherwise entirely negative.  PAST MEDICAL HISTORY: Past Medical History  Diagnosis Date  . Anemia   . GERD (gastroesophageal reflux disease)   . Headache(784.0)   . Ulcer   . Kidney stones   . Rheumatic fever     age 31  . UTI (lower urinary tract infection)   . Osteopenia   . Hyperlipidemia   . Thyroid disease     Hypothyroid  . Osteoporosis   . Atrophic vaginitis   . Hypertension   . Endometriosis   . Hypothyroidism   . Anxiety   . Arthritis   . Cancer [facility])     breast cancer  . Family history of breast cancer   . Family history of colon cancer     PAST SURGICAL HISTORY: Past Surgical History  Procedure Laterality Date  . Laparoscopic endometriosis fulguration  1978  . Ulnar nerve repair  2010  . Tonsillectomy    . Pelvic laparoscopy      FAMILY HISTORY Family History  Problem Relation Age of Onset  . Breast cancer Mother     Age 24  . Hypertension Mother   . Heart disease Father   . Stroke Father   . Colon cancer Father  dx 8s  . Breast cancer Paternal Grandmother     Age 45  . Diabetes Paternal Grandmother   . Heart disease Maternal Grandmother   The patient's father died at age 60 the patient's mother was diagnosed with breast cancer at age 39 and again at age 48, which was the year of her death. In addition a paternal grandmother was diagnosed with breast cancer in her 82s. The patient's  father had colon cancer. The patient had 2 brothers, no sisters. There is no history of ovarian cancer in the family.  GYNECOLOGIC HISTORY:  No LMP recorded. Patient is postmenopausal. Menarche age 81. The patient is GX P0. She went through menopause in her late 41s. She did not take hormone replacement. She never used oral contraceptives.  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 Her husband [name]") is retired from [employer]. They have an adopted son, [name], who is completing a second Engineer, production degree at [employer] state area he carries a diagnosis of schizophrenia, but is obviously very functional. The patient has no grandchildren. She is a Chief of Staff    ADVANCED DIRECTIVES:  NOT IN PLACE   HEALTH MAINTENANCE: Social History  Substance Use Topics  . Smoking status: Never Smoker   . Smokeless tobacco: Not on file  . Alcohol Use: No     Colonoscopy: 2012/[facility]   PAP: 2013?   Bone density: 2012/osteopenia   Lipid panel:  Allergies  Allergen Reactions  . Nitrofurantoin     REACTION: GI upset  . Sulfamethoxazole-Trimethoprim     REACTION: GI upset    Current Outpatient Prescriptions  Medication Sig Dispense Refill  . aspirin-acetaminophen-caffeine (EXCEDRIN MIGRAINE) 250-250-65 MG tablet Take 0.5 tablets by mouth every 6 (six) hours as needed for headache.    Marland Kitchen atenolol (TENORMIN) 50 MG tablet TAKE 1 TABLET (50 MG TOTAL) BY MOUTH DAILY. 30 tablet 5  . Cholecalciferol (VITAMIN D) 2000 UNITS CAPS Take 1 capsule by mouth daily.     . fish oil-omega-3 fatty acids 1000 MG capsule Take 2 g by mouth daily.      . hydrochlorothiazide (HYDRODIURIL) 25 MG tablet Take 1 tablet (25 mg total) by mouth daily. (Patient taking differently: Take 12.5 mg by mouth daily. ) 30 tablet 11  . levothyroxine (SYNTHROID, LEVOTHROID) 50 MCG tablet TAKE 1 TABLET BY MOUTH DAILY BEFORE BREAKFAST 30 tablet 5  . meclizine (ANTIVERT) 25 MG tablet Take 1 tablet (25 mg  total) by mouth 3 (three) times daily as needed for dizziness. (Patient not taking: Reported on 09/02/2015) 30 tablet 0  . Multiple Vitamin (MULTIVITAMIN WITH MINERALS) TABS tablet Take 1 tablet by mouth daily.    Earney Navy Bicarbonate (ZEGERID) 20-1100 MG CAPS capsule Take 1 capsule by mouth daily before breakfast.    . OVER THE COUNTER MEDICATION Take 1 capsule by mouth daily. Tumeric '475mg'$     . simvastatin (ZOCOR) 40 MG tablet TAKE 1 TABLET (40 MG TOTAL) BY MOUTH AT BEDTIME. 30 tablet 5  . tobramycin (TOBREX) 0.3 % ophthalmic solution Place 2 drops into both eyes every 4 (four) hours. (Patient not taking: Reported on 09/02/2015) 5 mL 0  . venlafaxine XR (EFFEXOR-XR) 150 MG 24 hr capsule Take 1 capsule (150 mg total) by mouth daily. 90 capsule 3  . zolpidem (AMBIEN) 5 MG tablet Take one to two tablets qhs prn insomnia (Patient taking differently: Take 5-10 mg by mouth at bedtime as needed for sleep. Take one to two tablets qhs prn insomnia) 60  tablet 1   No current facility-administered medications for this visit.    OBJECTIVE: Middle-aged white woman who appears well  Filed Vitals:   09/07/15 1612  BP: 153/54  Pulse: 68  Temp: 98.6 F (37 C)  Resp: 18     Body mass index is 28.17 kg/(m^2).    ECOG FS:0 - Asymptomatic  Ocular: Sclerae unicteric, pupils equal, round and reactive to light Ear-nose-throat: Oropharynx clear and moist Lymphatic: No cervical or supraclavicular adenopathy Lungs no rales or rhonchi, good excursion bilaterally Heart regular rate and rhythm, no murmur appreciated Abd soft, nontender, positive bowel sounds MSK no focal spinal tenderness, no joint edema Neuro: non-focal, well-oriented, appropriate affect Breasts:  the right breast is unremarkable. I do not palpate a mass in the left breast. There is no ecchymosis from the recent biopsy. There are no nipple or skin changes of concern. The left axilla is benign.    LAB RESULTS:  CMP     Component  Value Date/Time   NA 143 09/07/2015 1533   NA 140 09/05/2015 1558   K 4.1 09/07/2015 1533   K 4.4 09/05/2015 1558   CL 103 09/05/2015 1558   CO2 29 09/07/2015 1533   CO2 29 09/05/2015 1558   GLUCOSE 113 09/07/2015 1533   GLUCOSE 103* 09/05/2015 1558   BUN 26.5* 09/07/2015 1533   BUN 22* 09/05/2015 1558   CREATININE 1.3* 09/07/2015 1533   CREATININE 1.22* 09/05/2015 1558   CALCIUM 10.0 09/07/2015 1533   CALCIUM 10.1 09/05/2015 1558   PROT 7.7 09/07/2015 1533   PROT 7.4 09/05/2015 1558   ALBUMIN 4.4 09/07/2015 1533   ALBUMIN 4.5 09/05/2015 1558   AST 20 09/07/2015 1533   AST 24 09/05/2015 1558   ALT 20 09/07/2015 1533   ALT 23 09/05/2015 1558   ALKPHOS 68 09/07/2015 1533   ALKPHOS 65 09/05/2015 1558   BILITOT 0.34 09/07/2015 1533   BILITOT 0.6 09/05/2015 1558   GFRNONAA 42* 09/05/2015 1558   GFRAA 49* 09/05/2015 1558    INo results found for: SPEP, UPEP  Lab Results  Component Value Date   WBC 8.4 09/07/2015   NEUTROABS 4.6 09/07/2015   HGB 14.7 09/07/2015   HCT 44.6 09/07/2015   MCV 85.7 09/07/2015   PLT 260 09/07/2015      Chemistry      Component Value Date/Time   NA 143 09/07/2015 1533   NA 140 09/05/2015 1558   K 4.1 09/07/2015 1533   K 4.4 09/05/2015 1558   CL 103 09/05/2015 1558   CO2 29 09/07/2015 1533   CO2 29 09/05/2015 1558   BUN 26.5* 09/07/2015 1533   BUN 22* 09/05/2015 1558   CREATININE 1.3* 09/07/2015 1533   CREATININE 1.22* 09/05/2015 1558      Component Value Date/Time   CALCIUM 10.0 09/07/2015 1533   CALCIUM 10.1 09/05/2015 1558   ALKPHOS 68 09/07/2015 1533   ALKPHOS 65 09/05/2015 1558   AST 20 09/07/2015 1533   AST 24 09/05/2015 1558   ALT 20 09/07/2015 1533   ALT 23 09/05/2015 1558   BILITOT 0.34 09/07/2015 1533   BILITOT 0.6 09/05/2015 1558       No results found for: LABCA2  No components found for: LABCA125  No results for input(s): INR in the last 168 hours.  Urinalysis    Component Value Date/Time   BILIRUBINUR  1+ 09/28/2014 1124   PROTEINUR 2+ 09/28/2014 1124   UROBILINOGEN 1.0 09/28/2014 1124   NITRITE positive 09/28/2014 1124  LEUKOCYTESUR large (3+)* 09/28/2014 1124      ELIGIBLE FOR AVAILABLE RESEARCH PROTOCOL: no  STUDIES: Chest 2 View  09/05/2015  CLINICAL DATA:  75 year old female. Preoperative respiratory examination prior to lumpectomy for breast cancer. EXAM: CHEST  2 VIEW COMPARISON:  06/21/2004 FINDINGS: Upper limits normal heart size again noted. There is no evidence of focal airspace disease, pulmonary edema, suspicious pulmonary nodule/mass, pleural effusion, or pneumothorax. No acute bony abnormalities are identified. A moderate -severe thoracolumbar scoliosis again noted. IMPRESSION: No active cardiopulmonary disease. Electronically Signed   By: Margarette Canada M.D.   On: 09/05/2015 17:18   Mm Digital Diagnostic Unilat L  08/19/2015  CLINICAL DATA:  Status post ultrasound-guided core biopsy left breast mass EXAM: DIAGNOSTIC LEFT MAMMOGRAM POST ULTRASOUND BIOPSY COMPARISON:  Previous exam(s). FINDINGS: Mammographic images were obtained following left breast ultrasound guided biopsy of mass at 2:30 o'clock. Cc and lateral views of the left breast demonstrate ribbon biopsy clip in mass. IMPRESSION: Post biopsy mammogram demonstrating biopsy clip in the mass of concern. Final Assessment: Post Procedure Mammograms for Marker Placement Electronically Signed   By: Abelardo Diesel M.D.   On: 08/19/2015 15:00   Mm Radiologist Eval And Mgmt  08/22/2015  EXAM: ESTABLISHED PATIENT OFFICE VISIT - LEVEL II CHIEF COMPLAINT: Patient returns for wound site check and pathology results following ultrasound-guided core biopsy of the left breast on 08/19/2015. Current Pain Level: 1-10 HISTORY OF PRESENT ILLNESS: 76 year old female who had an abnormal screening mammogram on 07/29/2015. Diagnostic evaluation on 08/15/2015 showed a suspicious mass at 2:30 5 cm from the nipple in the left breast. Ultrasound-guided  core biopsy of the left breast was performed on 08/19/2015. EXAM: On physical exam post biopsy changes are seen. There is no significant hematoma or signs of infection. PATHOLOGY: Invasive mammary carcinoma as sampled grade 1 was reported histologically. This corresponds well with the imaging findings. ASSESSMENT AND PLAN: ASSESSMENT AND PLAN Surgical consultation has been scheduled with Dr. Zella Richer on 08/29/2015. Electronically Signed   By: Lillia Mountain M.D.   On: 08/22/2015 15:36   US Breast Ltd Uni Left Inc Axilla  08/15/2015  CLINICAL DATA:  Callback from screening mammogram for possible left breast mass EXAM: 2D DIGITAL DIAGNOSTIC LEFT MAMMOGRAM WITH CAD AND ADJUNCT TOMO ULTRASOUND LEFT BREAST COMPARISON:  Previous exam(s). ACR Breast Density Category b: There are scattered areas of fibroglandular density. FINDINGS: Cc and MLO spot-compression views of the left breast were performed with tomosynthesis. On the additional views, there is a 9 mm spiculated mass within the upper, outer left breast, corresponding to the finding seen on screening mammogram. Mammographic images were processed with CAD. Targeted ultrasound is performed demonstrating an irregular hypoechoic mass at 230, 5 cm from the nipple measuring approximately 8 x 8 x 7 mm, corresponding to the mass seen mammographically. Targeted ultrasound of the left axilla demonstrates no suspicious axillary lymph nodes. IMPRESSION: Suspicious left breast mass. RECOMMENDATION: Ultrasound-guided left breast biopsy. I have discussed the findings and recommendations with the patient. Results were also provided in writing at the conclusion of the visit. If applicable, a reminder letter will be sent to the patient regarding the next appointment. BI-RADS CATEGORY  5: Highly suggestive of malignancy. Electronically Signed   By: Pamelia Hoit M.D.   On: 08/15/2015 13:52   Mm Diag Breast Tomo Uni Left  08/15/2015  CLINICAL DATA:  Callback from screening mammogram for  possible left breast mass EXAM: 2D DIGITAL DIAGNOSTIC LEFT MAMMOGRAM WITH CAD AND ADJUNCT TOMO ULTRASOUND LEFT  BREAST COMPARISON:  Previous exam(s). ACR Breast Density Category b: There are scattered areas of fibroglandular density. FINDINGS: Cc and MLO spot-compression views of the left breast were performed with tomosynthesis. On the additional views, there is a 9 mm spiculated mass within the upper, outer left breast, corresponding to the finding seen on screening mammogram. Mammographic images were processed with CAD. Targeted ultrasound is performed demonstrating an irregular hypoechoic mass at 230, 5 cm from the nipple measuring approximately 8 x 8 x 7 mm, corresponding to the mass seen mammographically. Targeted ultrasound of the left axilla demonstrates no suspicious axillary lymph nodes. IMPRESSION: Suspicious left breast mass. RECOMMENDATION: Ultrasound-guided left breast biopsy. I have discussed the findings and recommendations with the patient. Results were also provided in writing at the conclusion of the visit. If applicable, a reminder letter will be sent to the patient regarding the next appointment. BI-RADS CATEGORY  5: Highly suggestive of malignancy. Electronically Signed   By: Dalphine Handing M.D.   On: 08/15/2015 13:52   [place] Lt Breast Bx W Loc Dev 1st Lesion Img Bx Spec US Guide  08/19/2015  CLINICAL DATA:  Mass left breast for biopsy EXAM: ULTRASOUND GUIDED LEFT BREAST CORE NEEDLE BIOPSY COMPARISON:  Previous exam(s). FINDINGS: I met with the patient and we discussed the procedure of ultrasound-guided biopsy, including benefits and alternatives. We discussed the high likelihood of a successful procedure. We discussed the risks of the procedure, including infection, bleeding, tissue injury, clip migration, and inadequate sampling. Informed written consent was given. The usual time-out protocol was performed immediately prior to the procedure. Using sterile technique and 1% Lidocaine as local  anesthetic, under direct ultrasound visualization, a 14 gauge spring-loaded device was used to perform biopsy of mass at left breast 2:30 o'clock using a lateral approach. At the conclusion of the procedure a ribbon tissue marker clip was deployed into the biopsy cavity. Follow up 2 view mammogram was performed and dictated separately. IMPRESSION: Ultrasound guided biopsy of left breast.  No apparent complications. Electronically Signed   By: Sherian Rein M.D.   On: 08/19/2015 14:43    ASSESSMENT: 75 y.o. [place] woman status post left breast upper outer quadrant biopsy 08/19/2015 for a clinical T1b N0, stage IA invasive ductal carcinoma, strongly estrogen and progesterone receptor positive, HER-2 not amplified, with an MIB-1 of 10%  (1) breast conserving surgery scheduled for 09/09/2015  (2) Oncotype DX to be obtained from the definitive surgical specimen  (3) adjuvant radiation to follow  (4) anti-estrogen therapy to follow at the completion of local treatment  (5) genetics testing pending  PLAN: We spent the better part of today's hour-long appointment discussing the biology of breast cancer in general, and the specifics of the patient's tumor in particular. Allayna has a good understanding of the difference between local and systemic therapy for breast cancer. In terms of local treatment she understands that lumpectomy plus radiation is equivalent to mastectomy in terms of survival.   Daiana does qualify for genetics testing. I think the chance of her carrying a deleterious mutation is well under 10%. Nevertheless if she did carry a deleterious mutation she would have a 40-80% chance of developing another breast cancer in either breast in the future. Some patients under those circumstances would choose bilateral mastectomies. Today we emphasize that intensified screening with yearly MRI as well as yearly mammography is equally safe. Bryella tells me that if she does carry a deleterious mutation  she is not entirely sure what she would want  to do but she is strongly leaning towards intensified screening, which of course is equally safe. Accordingly there is no reason to delay the surgery which is already scheduled for later this week  In terms of systemic therapy, the 3 options are anti-estrogens, anti-HER-2 immunotherapy, and chemotherapy. Shawny is not a candidate for anti-HER-2 treatment since her breast cancer cells are not HER-2 dependent. On the other hand she is an excellent candidate for anti-estrogens which we'll roughly cut the risk of recurrence in half. Today I gave her information on the difference between anastrozole and tamoxifen so she can be studying the possible side effects, toxicities and complications of these agents, which we will discuss once her local treatment has been completed  She understands that my expectation is she will not need chemotherapy since her breast cancer is early stage, slow-growing, and does not look aggressive. Nevertheless some cancers like hers occasionally do rate as "high risk" on the Oncotype test and in that case she will definitely want chemotherapy. That test takes approximately 2 weeks to come back. Accordingly we really will not know until then whether or not she will need chemotherapy  We will left it is that we will call her with the Oncotype result as soon as it is then. I do expect it to be low risk and so I am making a return appointment for her with me or mid-August which time I would expect her to have completed and recovered from her radiation treatments.  Dayannara  has a good understanding of the overall plan. She agrees with it. She knows the goal of treatment in her case is cure. She will call with any problems that may develop before her next visit here.  Chauncey Cruel, MD   09/07/2015 5:43 PM Medical Oncology and Hematology [facility] [address] [place], [place] [address] Tel. [phone]    Fax.  [phone]

## 2015-09-07 NOTE — Progress Notes (Signed)
Radiation Oncology         [phone] ________________________________  Initial outpatient Consultation  Name: [name] MRN: [id number]  Date: 09/07/2015  DOB: 20-Oct-1940  MA:[id number],YBWLS W, MD  Burchette, Alinda Sierras, MD   REFERRING PHYSICIAN: [name], MD  DIAGNOSIS:    ICD-9-CM ICD-10-CM   1. 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 female who presented with a possible left breast mass on screening tomo mammogram... She did not palpate a mass. On [place] the mass was about 53m in size with no axillary nodes appreciated on the left.    Biopsy showed invasive mammary carcinoma with characteristics as described above in the diagnosis.  Dr RZella Richerdiscussed lumpectomy vs mastectomy and she would like a lumpectomy.  SLN bx will also be performed.  Due to family history, Genetics referral made per Dr. RBertrum Solnote.  She reports that she was seen and her results are pending.  She sees med/onc today.  SAFETY ISSUES:  Prior radiation? No, she does report having Rheumatic Fever when she was 10. She remembers being in a room where there was radiation, but is not sure if she received any.   Pacemaker/ICD?  No  Possible current pregnancy? No  Is the patient on methotrexate? No  Current Complaints / other details:   Her mother had breast cancer at the age of 551  Her paternal great grandmother also had breast cancer.  Age at menarche was 89  She's not had any children.  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 She remembers being in a room where there was radiation, but is not sure if she received     PAST MEDICAL HISTORY:  has a past medical history of  Anemia; GERD (gastroesophageal reflux disease); Headache(784.0); Ulcer; Kidney stones; Rheumatic fever; UTI (lower urinary tract infection); Osteopenia; Hyperlipidemia; Thyroid disease; Osteoporosis; Atrophic vaginitis; Hypertension; Endometriosis; Hypothyroidism; Anxiety; Arthritis; Cancer ([place]; Family history of breast cancer; and Family history of colon cancer.    PAST SURGICAL HISTORY: Past Surgical History  Procedure Laterality Date  . Laparoscopic endometriosis fulguration  1978  . Ulnar nerve repair  2010  . Tonsillectomy    . Pelvic laparoscopy      FAMILY HISTORY: family history includes Breast cancer in her mother and paternal grandmother; Colon cancer in her father; Diabetes in her paternal grandmother; Heart disease in her father and maternal grandmother; Hypertension in her mother; Stroke in her father.  SOCIAL HISTORY:  reports that she has never smoked. She does not have any smokeless tobacco history on file. She reports that she does not drink alcohol or use illicit drugs.  ALLERGIES: Nitrofurantoin and Sulfamethoxazole-trimethoprim  MEDICATIONS:  Current Outpatient Prescriptions  Medication Sig Dispense Refill  . aspirin-acetaminophen-caffeine (EXCEDRIN MIGRAINE) 250-250-65 MG tablet Take 0.5 tablets by mouth every 6 (six) hours as needed for headache.    .Marland Kitchenatenolol (TENORMIN) 50 MG tablet TAKE 1 TABLET (50 MG TOTAL) BY MOUTH DAILY. 30 tablet 5  . Cholecalciferol (VITAMIN D) 2000 UNITS CAPS Take 1 capsule by mouth daily.     . fish oil-omega-3 fatty acids 1000 MG capsule Take 2 g by mouth daily.      .Marland Kitchen  hydrochlorothiazide (HYDRODIURIL) 25 MG tablet Take 1 tablet (25 mg total) by mouth daily. (Patient taking differently: Take 12.5 mg by mouth daily. ) 30 tablet 11  . levothyroxine (SYNTHROID, LEVOTHROID) 50 MCG tablet TAKE 1 TABLET BY MOUTH DAILY BEFORE BREAKFAST 30 tablet 5  . Multiple Vitamin (MULTIVITAMIN WITH MINERALS) TABS tablet Take 1 tablet by mouth daily.    Earney Navy Bicarbonate (ZEGERID) 20-1100 MG CAPS capsule Take 1 capsule by mouth daily before breakfast.    . OVER THE COUNTER MEDICATION Take 1 capsule by mouth daily. Tumeric 460m    . simvastatin (ZOCOR) 40 MG tablet TAKE 1 TABLET (40 MG TOTAL) BY MOUTH AT BEDTIME. 30 tablet 5  . venlafaxine XR (EFFEXOR-XR) 150 MG 24 hr capsule Take 1 capsule (150 mg total) by mouth daily. 90 capsule 3  . zolpidem (AMBIEN) 5 MG tablet Take one to two tablets qhs prn insomnia (Patient taking differently: Take 5-10 mg by mouth at bedtime as needed for sleep. Take one to two tablets qhs prn insomnia) 60 tablet 1  . meclizine (ANTIVERT) 25 MG tablet Take 1 tablet (25 mg total) by mouth 3 (three) times daily as needed for dizziness. (Patient not taking: Reported on 09/02/2015) 30 tablet 0  . tobramycin (TOBREX) 0.3 % ophthalmic solution Place 2 drops into both eyes every 4 (four) hours. (Patient not taking: Reported on 09/02/2015) 5 mL 0   No current facility-administered medications for this encounter.    REVIEW OF SYSTEMS:  Notable for that above.   PHYSICAL EXAM:  height is _0  (1.549 m) and weight is 150 lb 4.8 oz (68.176 kg). Her temperature is 97.6 F (36.4 C). Her blood pressure is 157/68 and her pulse is 67.   General: Alert and oriented, in no acute distress HEENT: Head is normocephalic. Extraocular movements are intact. Oropharynx is clear. Neck: Neck is supple, no palpable cervical or supraclavicular lymphadenopathy. Heart: Regular in rate and rhythm with no murmurs, rubs, or gallops. Chest: Clear to auscultation bilaterally, with no rhonchi, wheezes, or rales. Abdomen: Soft, nontender, nondistended, with no rigidity or guarding. Extremities: No cyanosis or edema. Lymphatics: see Neck Exam Skin: No concerning lesions. Musculoskeletal: symmetric strength and muscle tone throughout. Neurologic: Cranial nerves II through XII are grossly intact. No obvious focalities. Speech is fluent.  Coordination is intact. Psychiatric: Judgment and insight are intact. Affect is appropriate. Breasts: sub centimeter mass, 2:30, left breast . No other palpable masses appreciated in the breasts or axillae bilaterally .    ECOG = 0  0 - Asymptomatic (Fully active, able to carry on all predisease activities without restriction)  1 - Symptomatic but completely ambulatory (Restricted in physically strenuous activity but ambulatory and able to carry out work of a light or sedentary nature. For example, light housework, office work)  2 - Symptomatic, <50% in bed during the day (Ambulatory and capable of all self care but unable to carry out any work activities. Up and about more than 50% of waking hours)  3 - Symptomatic, >50% in bed, but not bedbound (Capable of only limited self-care, confined to bed or chair 50% or more of waking hours)  4 - Bedbound (Completely disabled. Cannot carry on any self-care. Totally confined to bed or chair)  5 - Death   OEustace PenMM, Creech RH, Tormey DC, et al. [phone]. "Toxicity and response criteria of the [facility]". AB[place] AcresOncol. 5 (6): 649-55   LABORATORY DATA:  Lab Results  Component Value  Date   WBC 8.4 09/07/2015   HGB 14.7 09/07/2015   HCT 44.6 09/07/2015   MCV 85.7 09/07/2015   PLT 260 09/07/2015   CMP     Component Value Date/Time   NA 143 09/07/2015 1533   NA 140 09/05/2015 1558   K 4.1 09/07/2015 1533   K 4.4 09/05/2015 1558   CL 103 09/05/2015 1558   CO2 29 09/07/2015 1533   CO2 29 09/05/2015 1558   GLUCOSE 113 09/07/2015 1533   GLUCOSE 103* 09/05/2015 1558   BUN 26.5* 09/07/2015 1533   BUN 22* 09/05/2015 1558   CREATININE 1.3* 09/07/2015 1533   CREATININE 1.22* 09/05/2015 1558   CALCIUM 10.0 09/07/2015 1533   CALCIUM 10.1 09/05/2015 1558   PROT 7.7 09/07/2015 1533   PROT 7.4 09/05/2015 1558   ALBUMIN 4.4 09/07/2015 1533   ALBUMIN 4.5 09/05/2015 1558   AST 20 09/07/2015 1533   AST 24 09/05/2015  1558   ALT 20 09/07/2015 1533   ALT 23 09/05/2015 1558   ALKPHOS 68 09/07/2015 1533   ALKPHOS 65 09/05/2015 1558   BILITOT 0.34 09/07/2015 1533   BILITOT 0.6 09/05/2015 1558   GFRNONAA 42* 09/05/2015 1558   GFRAA 49* 09/05/2015 1558         RADIOGRAPHY: Chest 2 View  09/05/2015  CLINICAL DATA:  75 year old female. Preoperative respiratory examination prior to lumpectomy for breast cancer. EXAM: CHEST  2 VIEW COMPARISON:  06/21/2004 FINDINGS: Upper limits normal heart size again noted. There is no evidence of focal airspace disease, pulmonary edema, suspicious pulmonary nodule/mass, pleural effusion, or pneumothorax. No acute bony abnormalities are identified. A moderate -severe thoracolumbar scoliosis again noted. IMPRESSION: No active cardiopulmonary disease. Electronically Signed   By: Margarette Canada M.D.   On: 09/05/2015 17:18   Mm Digital Diagnostic Unilat L  08/19/2015  CLINICAL DATA:  Status post ultrasound-guided core biopsy left breast mass EXAM: DIAGNOSTIC LEFT MAMMOGRAM POST ULTRASOUND BIOPSY COMPARISON:  Previous exam(s). FINDINGS: Mammographic images were obtained following left breast ultrasound guided biopsy of mass at 2:30 o'clock. Cc and lateral views of the left breast demonstrate ribbon biopsy clip in mass. IMPRESSION: Post biopsy mammogram demonstrating biopsy clip in the mass of concern. Final Assessment: Post Procedure Mammograms for Marker Placement Electronically Signed   By: Abelardo Diesel M.D.   On: 08/19/2015 15:00   Mm Radiologist Eval And Mgmt  08/22/2015  EXAM: ESTABLISHED PATIENT OFFICE VISIT - LEVEL II CHIEF COMPLAINT: Patient returns for wound site check and pathology results following ultrasound-guided core biopsy of the left breast on 08/19/2015. Current Pain Level: 1-10 HISTORY OF PRESENT ILLNESS: 75 year old female who had an abnormal screening mammogram on 07/29/2015. Diagnostic evaluation on 08/15/2015 showed a suspicious mass at 2:30 5 cm from the nipple in the  left breast. Ultrasound-guided core biopsy of the left breast was performed on 08/19/2015. EXAM: On physical exam post biopsy changes are seen. There is no significant hematoma or signs of infection. PATHOLOGY: Invasive mammary carcinoma as sampled grade 1 was reported histologically. This corresponds well with the imaging findings. ASSESSMENT AND PLAN: ASSESSMENT AND PLAN Surgical consultation has been scheduled with Dr. Zella Richer on 08/29/2015. Electronically Signed   By: Lillia Mountain M.D.   On: 08/22/2015 15:36   US Breast Ltd Uni Left Inc Axilla  08/15/2015  CLINICAL DATA:  Callback from screening mammogram for possible left breast mass EXAM: 2D DIGITAL DIAGNOSTIC LEFT MAMMOGRAM WITH CAD AND ADJUNCT TOMO ULTRASOUND LEFT BREAST COMPARISON:  Previous exam(s). ACR Breast Density  Category b: There are scattered areas of fibroglandular density. FINDINGS: Cc and MLO spot-compression views of the left breast were performed with tomosynthesis. On the additional views, there is a 9 mm spiculated mass within the upper, outer left breast, corresponding to the finding seen on screening mammogram. Mammographic images were processed with CAD. Targeted ultrasound is performed demonstrating an irregular hypoechoic mass at 230, 5 cm from the nipple measuring approximately 8 x 8 x 7 mm, corresponding to the mass seen mammographically. Targeted ultrasound of the left axilla demonstrates no suspicious axillary lymph nodes. IMPRESSION: Suspicious left breast mass. RECOMMENDATION: Ultrasound-guided left breast biopsy. I have discussed the findings and recommendations with the patient. Results were also provided in writing at the conclusion of the visit. If applicable, a reminder letter will be sent to the patient regarding the next appointment. BI-RADS CATEGORY  5: Highly suggestive of malignancy. Electronically Signed   By: Pamelia Hoit M.D.   On: 08/15/2015 13:52   Mm Diag Breast Tomo Uni Left  08/15/2015  CLINICAL DATA:   Callback from screening mammogram for possible left breast mass EXAM: 2D DIGITAL DIAGNOSTIC LEFT MAMMOGRAM WITH CAD AND ADJUNCT TOMO ULTRASOUND LEFT BREAST COMPARISON:  Previous exam(s). ACR Breast Density Category b: There are scattered areas of fibroglandular density. FINDINGS: Cc and MLO spot-compression views of the left breast were performed with tomosynthesis. On the additional views, there is a 9 mm spiculated mass within the upper, outer left breast, corresponding to the finding seen on screening mammogram. Mammographic images were processed with CAD. Targeted ultrasound is performed demonstrating an irregular hypoechoic mass at 230, 5 cm from the nipple measuring approximately 8 x 8 x 7 mm, corresponding to the mass seen mammographically. Targeted ultrasound of the left axilla demonstrates no suspicious axillary lymph nodes. IMPRESSION: Suspicious left breast mass. RECOMMENDATION: Ultrasound-guided left breast biopsy. I have discussed the findings and recommendations with the patient. Results were also provided in writing at the conclusion of the visit. If applicable, a reminder letter will be sent to the patient regarding the next appointment. BI-RADS CATEGORY  5: Highly suggestive of malignancy. Electronically Signed   By: Pamelia Hoit M.D.   On: 08/15/2015 13:52   [place] Lt Breast Bx W Loc Dev 1st Lesion Img Bx Spec US Guide  08/19/2015  CLINICAL DATA:  Mass left breast for biopsy EXAM: ULTRASOUND GUIDED LEFT BREAST CORE NEEDLE BIOPSY COMPARISON:  Previous exam(s). FINDINGS: I met with the patient and we discussed the procedure of ultrasound-guided biopsy, including benefits and alternatives. We discussed the high likelihood of a successful procedure. We discussed the risks of the procedure, including infection, bleeding, tissue injury, clip migration, and inadequate sampling. Informed written consent was given. The usual time-out protocol was performed immediately prior to the procedure. Using sterile  technique and 1% Lidocaine as local anesthetic, under direct ultrasound visualization, a 14 gauge spring-loaded device was used to perform biopsy of mass at left breast 2:30 o'clock using a lateral approach. At the conclusion of the procedure a ribbon tissue marker clip was deployed into the biopsy cavity. Follow up 2 view mammogram was performed and dictated separately. IMPRESSION: Ultrasound guided biopsy of left breast.  No apparent complications. Electronically Signed   By: Abelardo Diesel M.D.   On: 08/19/2015 14:43      IMPRESSION/PLAN:  Stage I breast cancer ER+  She is a good candidate for breast conservation. She has been educated about surgical options. She is enthusiastic about breast conservation.  For the patient's early  stage favorable risk breast cancer, we had a thorough discussion about her options for adjuvant therapy. One option would be antiestrogen therapy as discussed with medical oncology. She would take a pill for approximately 5 years. The alternative option (but less standard) would be radiotherapy to the breast. The most aggressive option would be to pursue both modalities.  Of note, I discussed the data from the [employer] al trial in the [place] [facility]. She understands that tamoxifen compared to radiation plus tamoxifen demonstrated no survival benefit among the women in this study. The women were 37 years or older with stage I estrogen receptor positive breast cancer. Based on this study, I told the patient that her overall life expectancy should not be affected by adding radiotherapy to antiestrogen medication. She understands that the main benefit of  adding radiotherapy to anti estrogen therapy would be a very small but measurable local control benefit (risk of local recurrence to be lowered from ~9% --> ~2% over a decade).  We discussed the fact that radiotherapy only provides a local control benefit while anti-estrogen pills provide systemic coverage. That  being said, the risk of systemic failure is relatively low with her type of breast cancer.  She would like to think about the side effects of antiestrogen therapy and radiation therapy before making her decision.   We discussed the risks benefits and side effects of radiotherapy. She understands that the side effects would likely include some skin irritation and fatigue during the weeks of radiation. There is a risk of late effects which include but are not necessarily limited to heart or lung toxicity. I would anticipate delivering approximately 4weeks of radiotherapy.  After a thorough discussion, the patient would like to think about her options.  I will see her back PRN after surgery to discuss her options further.    __________________________________________   Eppie Gibson, MD

## 2015-09-08 ENCOUNTER — Telehealth: Payer: Self-pay | Admitting: Oncology

## 2015-09-08 ENCOUNTER — Ambulatory Visit
Admission: RE | Admit: 2015-09-08 | Discharge: 2015-09-08 | Disposition: A | Discharge status: Home / Self Care | Payer: Medicare Other | Source: Ambulatory Visit | Attending: General Surgery | Admitting: General Surgery

## 2015-09-08 DIAGNOSIS — C50412 Malignant neoplasm of upper-outer quadrant of left female breast: Secondary | ICD-10-CM

## 2015-09-08 DIAGNOSIS — C50912 Malignant neoplasm of unspecified site of left female breast: Secondary | ICD-10-CM | POA: Diagnosis not present

## 2015-09-08 HISTORY — PX: BREAST LUMPECTOMY: SHX2

## 2015-09-08 MED ORDER — CEFAZOLIN SODIUM-DEXTROSE 2-4 GM/100ML-% IV SOLN
2.0000 g | INTRAVENOUS | Status: AC
  Administered 2015-09-09: 2 g via INTRAVENOUS
  Filled 2015-09-08: qty 100

## 2015-09-08 NOTE — Telephone Encounter (Signed)
appt made and letter sent by mail °

## 2015-09-08 NOTE — Addendum Note (Signed)
Encounter addended by: Ernst Spell, RN on: 09/08/2015  7:47 AM<BR>     Documentation filed: Charges VN

## 2015-09-08 NOTE — Addendum Note (Signed)
Addended by: Lujean Amel on: 09/08/2015 09:05 AM   Modules accepted: Medications

## 2015-09-09 ENCOUNTER — Encounter (HOSPITAL_COMMUNITY): Payer: Self-pay | Admitting: General Practice

## 2015-09-09 ENCOUNTER — Ambulatory Visit (HOSPITAL_COMMUNITY): Payer: Medicare Other | Admitting: Anesthesiology

## 2015-09-09 ENCOUNTER — Ambulatory Visit (HOSPITAL_COMMUNITY)
Admission: RE | Admit: 2015-09-09 | Discharge: 2015-09-09 | Disposition: A | Discharge status: Home / Self Care | Payer: Medicare Other | Source: Ambulatory Visit | Attending: General Surgery | Admitting: General Surgery

## 2015-09-09 ENCOUNTER — Ambulatory Visit
Admission: RE | Admit: 2015-09-09 | Discharge: 2015-09-09 | Disposition: A | Discharge status: Home / Self Care | Payer: Medicare Other | Source: Ambulatory Visit | Attending: General Surgery | Admitting: General Surgery

## 2015-09-09 ENCOUNTER — Encounter (HOSPITAL_COMMUNITY)
Admission: RE | Disposition: A | Discharge status: Home / Self Care | Payer: Self-pay | Source: Ambulatory Visit | Attending: General Surgery

## 2015-09-09 DIAGNOSIS — Z79899 Other long term (current) drug therapy: Secondary | ICD-10-CM | POA: Diagnosis not present

## 2015-09-09 DIAGNOSIS — Z803 Family history of malignant neoplasm of breast: Secondary | ICD-10-CM | POA: Insufficient documentation

## 2015-09-09 DIAGNOSIS — C50412 Malignant neoplasm of upper-outer quadrant of left female breast: Secondary | ICD-10-CM | POA: Diagnosis not present

## 2015-09-09 DIAGNOSIS — C50912 Malignant neoplasm of unspecified site of left female breast: Secondary | ICD-10-CM | POA: Diagnosis not present

## 2015-09-09 DIAGNOSIS — E039 Hypothyroidism, unspecified: Secondary | ICD-10-CM | POA: Insufficient documentation

## 2015-09-09 DIAGNOSIS — Z170 Estrogen receptor positive status [ER+]: Secondary | ICD-10-CM | POA: Insufficient documentation

## 2015-09-09 DIAGNOSIS — I10 Essential (primary) hypertension: Secondary | ICD-10-CM | POA: Insufficient documentation

## 2015-09-09 DIAGNOSIS — K219 Gastro-esophageal reflux disease without esophagitis: Secondary | ICD-10-CM | POA: Insufficient documentation

## 2015-09-09 DIAGNOSIS — G8918 Other acute postprocedural pain: Secondary | ICD-10-CM | POA: Diagnosis not present

## 2015-09-09 DIAGNOSIS — D0592 Unspecified type of carcinoma in situ of left breast: Secondary | ICD-10-CM | POA: Diagnosis not present

## 2015-09-09 HISTORY — PX: BREAST LUMPECTOMY WITH RADIOACTIVE SEED AND SENTINEL LYMPH NODE BIOPSY: SHX6550

## 2015-09-09 SURGERY — BREAST LUMPECTOMY WITH RADIOACTIVE SEED AND SENTINEL LYMPH NODE BIOPSY
Anesthesia: Regional | Site: Breast | Laterality: Left

## 2015-09-09 MED ORDER — HYDROCODONE-ACETAMINOPHEN 5-325 MG PO TABS: 1.0000 | ORAL_TABLET | ORAL | Status: DC | PRN

## 2015-09-09 MED ORDER — PHENYLEPHRINE 40 MCG/ML (10ML) SYRINGE FOR IV PUSH (FOR BLOOD PRESSURE SUPPORT)
PREFILLED_SYRINGE | INTRAVENOUS | Status: AC
  Filled 2015-09-09: qty 10

## 2015-09-09 MED ORDER — PROPOFOL 10 MG/ML IV BOLUS
INTRAVENOUS | Status: AC
  Filled 2015-09-09: qty 20

## 2015-09-09 MED ORDER — EPHEDRINE 5 MG/ML INJ
INTRAVENOUS | Status: AC
  Filled 2015-09-09: qty 20

## 2015-09-09 MED ORDER — FENTANYL CITRATE (PF) 100 MCG/2ML IJ SOLN
INTRAMUSCULAR | Status: AC
  Administered 2015-09-09: 50 ug
  Filled 2015-09-09: qty 2

## 2015-09-09 MED ORDER — DEXAMETHASONE SODIUM PHOSPHATE 10 MG/ML IJ SOLN
INTRAMUSCULAR | Status: DC | PRN
  Administered 2015-09-09: 4 mg via INTRAVENOUS

## 2015-09-09 MED ORDER — TECHNETIUM TC 99M SULFUR COLLOID FILTERED
1.0000 | Freq: Once | INTRAVENOUS | Status: AC | PRN
  Administered 2015-09-09: 1 via INTRADERMAL

## 2015-09-09 MED ORDER — ARTIFICIAL TEARS OP OINT
TOPICAL_OINTMENT | OPHTHALMIC | Status: AC
  Filled 2015-09-09: qty 3.5

## 2015-09-09 MED ORDER — BUPIVACAINE-EPINEPHRINE (PF) 0.5% -1:200000 IJ SOLN
INTRAMUSCULAR | Status: DC | PRN
  Administered 2015-09-09: 25 mL

## 2015-09-09 MED ORDER — SUGAMMADEX SODIUM 200 MG/2ML IV SOLN
INTRAVENOUS | Status: AC
  Filled 2015-09-09: qty 2

## 2015-09-09 MED ORDER — MIDAZOLAM HCL 5 MG/5ML IJ SOLN
INTRAMUSCULAR | Status: DC | PRN
  Administered 2015-09-09: 1 mg via INTRAVENOUS

## 2015-09-09 MED ORDER — PROPOFOL 10 MG/ML IV BOLUS
INTRAVENOUS | Status: DC | PRN
  Administered 2015-09-09: 150 mg via INTRAVENOUS

## 2015-09-09 MED ORDER — LIDOCAINE HCL (CARDIAC) 20 MG/ML IV SOLN
INTRAVENOUS | Status: DC | PRN
  Administered 2015-09-09: 20 mg via INTRAVENOUS

## 2015-09-09 MED ORDER — ROCURONIUM BROMIDE 50 MG/5ML IV SOLN
INTRAVENOUS | Status: AC
  Filled 2015-09-09: qty 1

## 2015-09-09 MED ORDER — LIDOCAINE 2% (20 MG/ML) 5 ML SYRINGE
INTRAMUSCULAR | Status: AC
  Filled 2015-09-09: qty 10

## 2015-09-09 MED ORDER — ONDANSETRON HCL 4 MG/2ML IJ SOLN
INTRAMUSCULAR | Status: AC
  Filled 2015-09-09: qty 2

## 2015-09-09 MED ORDER — ONDANSETRON HCL 4 MG PO TABS: 4.0000 mg | ORAL_TABLET | ORAL | Status: DC | PRN

## 2015-09-09 MED ORDER — MIDAZOLAM HCL 2 MG/2ML IJ SOLN
INTRAMUSCULAR | Status: AC
  Administered 2015-09-09: 1 mg
  Filled 2015-09-09: qty 2

## 2015-09-09 MED ORDER — SUCCINYLCHOLINE CHLORIDE 200 MG/10ML IV SOSY
PREFILLED_SYRINGE | INTRAVENOUS | Status: AC
  Filled 2015-09-09: qty 20

## 2015-09-09 MED ORDER — EPHEDRINE SULFATE-NACL 50-0.9 MG/10ML-% IV SOSY
PREFILLED_SYRINGE | INTRAVENOUS | Status: DC | PRN
  Administered 2015-09-09: 10 mg via INTRAVENOUS
  Administered 2015-09-09 (×3): 5 mg via INTRAVENOUS

## 2015-09-09 MED ORDER — ONDANSETRON HCL 4 MG/2ML IJ SOLN
INTRAMUSCULAR | Status: DC | PRN
  Administered 2015-09-09: 4 mg via INTRAVENOUS

## 2015-09-09 MED ORDER — 0.9 % SODIUM CHLORIDE (POUR BTL) OPTIME
TOPICAL | Status: DC | PRN
  Administered 2015-09-09: 1000 mL

## 2015-09-09 MED ORDER — BUPIVACAINE-EPINEPHRINE (PF) 0.25% -1:200000 IJ SOLN
INTRAMUSCULAR | Status: AC
  Filled 2015-09-09: qty 30

## 2015-09-09 MED ORDER — LIDOCAINE 2% (20 MG/ML) 5 ML SYRINGE
INTRAMUSCULAR | Status: AC
  Filled 2015-09-09: qty 5

## 2015-09-09 MED ORDER — BUPIVACAINE-EPINEPHRINE 0.25% -1:200000 IJ SOLN
INTRAMUSCULAR | Status: DC | PRN
  Administered 2015-09-09: 30 mL

## 2015-09-09 MED ORDER — EPHEDRINE 5 MG/ML INJ
INTRAVENOUS | Status: AC
  Filled 2015-09-09: qty 10

## 2015-09-09 MED ORDER — FENTANYL CITRATE (PF) 250 MCG/5ML IJ SOLN
INTRAMUSCULAR | Status: AC
  Filled 2015-09-09: qty 5

## 2015-09-09 MED ORDER — SODIUM CHLORIDE FLUSH 0.9 % IV SOLN
INTRAVENOUS | Status: AC
  Filled 2015-09-09: qty 10

## 2015-09-09 MED ORDER — LACTATED RINGERS IV SOLN
INTRAVENOUS | Status: DC
  Administered 2015-09-09 (×2): via INTRAVENOUS

## 2015-09-09 MED ORDER — FENTANYL CITRATE (PF) 100 MCG/2ML IJ SOLN
INTRAMUSCULAR | Status: DC | PRN
  Administered 2015-09-09 (×3): 25 ug via INTRAVENOUS

## 2015-09-09 MED ORDER — METHYLENE BLUE 0.5 % INJ SOLN
INTRAVENOUS | Status: AC
  Filled 2015-09-09: qty 10

## 2015-09-09 MED ORDER — MIDAZOLAM HCL 2 MG/2ML IJ SOLN
INTRAMUSCULAR | Status: AC
  Filled 2015-09-09: qty 2

## 2015-09-09 SURGICAL SUPPLY — 53 items
APL SKNCLS STERI-STRIP NONHPOA (GAUZE/BANDAGES/DRESSINGS) ×1
APPLIER CLIP 9.375 MED OPEN (MISCELLANEOUS) ×3
APR CLP MED 9.3 20 MLT OPN (MISCELLANEOUS) ×1
BENZOIN TINCTURE PRP APPL 2/3 (GAUZE/BANDAGES/DRESSINGS) ×3 IMPLANT
BINDER BREAST LRG (GAUZE/BANDAGES/DRESSINGS) ×2 IMPLANT
BINDER BREAST XLRG (GAUZE/BANDAGES/DRESSINGS) IMPLANT
BLADE SURG 10 STRL SS (BLADE) ×3 IMPLANT
BLADE SURG 15 STRL LF DISP TIS (BLADE) ×1 IMPLANT
BLADE SURG 15 STRL SS (BLADE) ×3
CANISTER SUCTION 2500CC (MISCELLANEOUS) ×2 IMPLANT
CHLORAPREP W/TINT 26ML (MISCELLANEOUS) ×3 IMPLANT
CLIP APPLIE 9.375 MED OPEN (MISCELLANEOUS) ×1 IMPLANT
CLOSURE WOUND 1/2 X4 (GAUZE/BANDAGES/DRESSINGS) ×1
CONT SPEC 4OZ CLIKSEAL STRL BL (MISCELLANEOUS) ×6 IMPLANT
COVER PROBE W GEL 5X96 (DRAPES) ×3 IMPLANT
COVER SURGICAL LIGHT HANDLE (MISCELLANEOUS) ×3 IMPLANT
DEVICE DUBIN SPECIMEN MAMMOGRA (MISCELLANEOUS) ×3 IMPLANT
DRAPE CHEST BREAST 15X10 FENES (DRAPES) ×3 IMPLANT
DRAPE UTILITY XL STRL (DRAPES) ×3 IMPLANT
DRSG PAD ABDOMINAL 8X10 ST (GAUZE/BANDAGES/DRESSINGS) ×2 IMPLANT
ELECT CAUTERY BLADE 6.4 (BLADE) ×3 IMPLANT
ELECT REM PT RETURN 9FT ADLT (ELECTROSURGICAL) ×3
ELECTRODE REM PT RTRN 9FT ADLT (ELECTROSURGICAL) ×1 IMPLANT
GAUZE SPONGE 4X4 12PLY STRL (GAUZE/BANDAGES/DRESSINGS) ×3 IMPLANT
GLOVE BIOGEL PI IND STRL 8 (GLOVE) ×2 IMPLANT
GLOVE BIOGEL PI INDICATOR 8 (GLOVE) ×4
GLOVE ECLIPSE 8.0 STRL XLNG CF (GLOVE) ×3 IMPLANT
GOWN STRL REUS W/ TWL LRG LVL3 (GOWN DISPOSABLE) ×2 IMPLANT
GOWN STRL REUS W/TWL LRG LVL3 (GOWN DISPOSABLE) ×6
KIT BASIN OR (CUSTOM PROCEDURE TRAY) ×3 IMPLANT
KIT MARKER MARGIN INK (KITS) ×3 IMPLANT
NDL FILTER BLUNT 18X1 1/2 (NEEDLE) IMPLANT
NDL HYPO 25X1 1.5 SAFETY (NEEDLE) ×1 IMPLANT
NDL SAFETY ECLIPSE 18X1.5 (NEEDLE) IMPLANT
NEEDLE FILTER BLUNT 18X 1/2SAF (NEEDLE)
NEEDLE FILTER BLUNT 18X1 1/2 (NEEDLE) IMPLANT
NEEDLE HYPO 18GX1.5 SHARP (NEEDLE)
NEEDLE HYPO 25X1 1.5 SAFETY (NEEDLE) ×3 IMPLANT
NS IRRIG 1000ML POUR BTL (IV SOLUTION) ×3 IMPLANT
PACK SURGICAL SETUP 50X90 (CUSTOM PROCEDURE TRAY) ×3 IMPLANT
PENCIL BUTTON HOLSTER BLD 10FT (ELECTRODE) ×3 IMPLANT
SPONGE GAUZE 4X4 12PLY STER LF (GAUZE/BANDAGES/DRESSINGS) ×2 IMPLANT
SPONGE LAP 18X18 X RAY DECT (DISPOSABLE) ×3 IMPLANT
STRIP CLOSURE SKIN 1/2X4 (GAUZE/BANDAGES/DRESSINGS) ×2 IMPLANT
SUT MNCRL AB 4-0 PS2 18 (SUTURE) ×3 IMPLANT
SUT VIC AB 3-0 SH 18 (SUTURE) ×3 IMPLANT
SYR BULB 3OZ (MISCELLANEOUS) ×3 IMPLANT
SYR CONTROL 10ML LL (SYRINGE) ×3 IMPLANT
TOWEL OR 17X24 6PK STRL BLUE (TOWEL DISPOSABLE) ×3 IMPLANT
TOWEL OR 17X26 10 PK STRL BLUE (TOWEL DISPOSABLE) ×3 IMPLANT
TUBE CONNECTING 12'X1/4 (SUCTIONS) ×1
TUBE CONNECTING 12X1/4 (SUCTIONS) ×1 IMPLANT
YANKAUER SUCT BULB TIP NO VENT (SUCTIONS) ×3 IMPLANT

## 2015-09-09 NOTE — Op Note (Signed)
Operative Note  [name] female 75 y.o. 09/09/2015  PREOPERATIVE DX:  Invasive mammary carcinoma left breast  POSTOPERATIVE DX:  Same  PROCEDURE:   Left axillary lymphatic mapping. Left partial mastectomy after radioactive seed localization. Left axillary sentinel lymph node biopsy.         Surgeon: Odis Hollingshead   Assistants: none  Anesthesia: General LMA anesthesia  Indications:   This is a 75 year old female found to have a spiculated mass in the upper outer quadrant of the left breast on mammogram.Image guided biopsy demonstrated invasive mammary carcinoma. She now presents for the above procedure. Preoperatively she underwent radioactive seed localization procedure as well as injection of radioactive fluid around the nipple area in the holding room. The anesthesiologist also performed a pectoral block.    Procedure Detail:  She was seen in the holding area. I verifiedthe presence of the seed using the neoprobe. My initials were put on her left breast. She was brought to the operating room and placed supine on the operating table and a general anesthetic was given. Using a neoprobe, I detected increased counts in the inferior aspect of the left axilla.   The left breast and axillary areas were sterilely prepped and draped. A timeout was performed.  Beginning in the left axillary area, transverse incision was made through the skin and subcutaneous tissue until I identified the axillary contents. Using a neoprobe and a lymph node with increased counts was noted and removed with electrocautery.A second lymph node with increased counts was also noted using the neoprobe and this was removed. Following this, there are no increased counts in the left axilla. Thus 2 sentinel lymph nodes were removed and sent to pathology.  The wound was inspected and hemostasis was adequate. The subcutaneous tissue was approximated with interrupted 3-0 Vicryl sutures. The skin was closed with 4-0  Monocryl subcuticular stitch.  Using a neoprobe,  i identified an area of increased counts in the upper outer quadrant of the left breast.A curvilinear incision was made through the skin and subcutaneous tissue. Using the neoprobe as a guide, I use electrocautery to perform partial mastectomy keeping the area of increased counts in the middle of the specimen. Once the breast tissue was removed, it was oriented using ink. A specimen mammogram was performed. The radioactive seed, marking clip, and lesion were noted in the middle of the specimen.  This was confirmed by the radiologist.There is no radioactivity in the partial mastectomy bed.  The specimen was sent to pathology.  The wound was inspected and hemostasis maintained using electrocautery.  Once hemostasis was adequate, the subcutaneous tissues approximated with interrupted 3-0 Vicryl sutures. The skin was closed with a running 4-0 Monocryl subcuticular stitch. Steri-Strips and sterile dressings were placed on all wounds. A breast binder was applied.  She tolerated the procedure well without any apparent complications. She was taken to the recovery room in satisfactory condition.  Estimated Blood Loss:  100 ml         Specimens: Left breast tissue. Left axillary sentinel lymph nodes.        Complications:  * No complications entered in OR log *         Disposition: PACU - hemodynamically stable.         Condition: stable

## 2015-09-09 NOTE — Interval H&P Note (Signed)
History and Physical Interval Note:  09/09/2015 1:22 PM  [name]  has presented today for surgery, with the diagnosis of INVASIVE LEFT BREAST CANCER  The various methods of treatment have been discussed with the patient and family. After consideration of risks, benefits and other options for treatment, the patient has consented to  Procedure(s): BREAST LUMPECTOMY WITH RADIOACTIVE SEED AND SENTINEL LYMPH NODE BIOPSY (Left) as a surgical intervention .  The patient's history has been reviewed, patient examined, no change in status, stable for surgery.  I have reviewed the patient's chart and labs.  Questions were answered to the patient's satisfaction.     Michelle Wnek [name]

## 2015-09-09 NOTE — Discharge Instructions (Signed)
[place] Office Phone Number [phone]  BREAST BIOPSY/ PARTIAL MASTECTOMY: POST OP INSTRUCTIONS  Always review your discharge instruction sheet given to you by the facility where your surgery was performed.  IF YOU HAVE DISABILITY OR FAMILY LEAVE FORMS, YOU MUST BRING THEM TO THE OFFICE FOR PROCESSING.  DO NOT GIVE THEM TO YOUR DOCTOR.  1. A prescription for pain medication may be given to you upon discharge.  Take your pain medication as prescribed, if needed.  If narcotic pain medicine is not needed, then you may take acetaminophen (Tylenol) or ibuprofen (Advil) as needed. 2. Take your usually prescribed medications unless otherwise directed 3. If you need a refill on your pain medication, please contact your pharmacy.  They will contact our office to request authorization.  Prescriptions will not be filled after 5pm or on week-ends. 4. You should eat very light the first 24 hours after surgery, such as soup, crackers, pudding, etc.  Resume your normal diet the day after surgery. 5. Most patients will experience some swelling and bruising in the breast.  Ice packs and a good support bra will help.  Swelling and bruising can take several days to resolve.  6. It is common to experience some constipation if taking pain medication after surgery.  Increasing fluid intake and taking a stool softener will usually help or prevent this problem from occurring.  A mild laxative (Milk of Magnesia or Miralax) should be taken according to package directions if there are no bowel movements after 48 hours. 7. Unless discharge instructions indicate otherwise, you may remove your bandages 48 hours after surgery, and you may shower at that time.  You may have steri-strips (small skin tapes) in place directly over the incision.  These strips should be left on the skin.  If your surgeon used skin glue on the incision, you may shower in 24 hours.  The glue will flake off over the next 2-3 weeks.   Any sutures or staples will be removed at the office during your follow-up visit. 8. ACTIVITIES:  You may resume light daily activities (gradually increasing) beginning the next day.  No heavy lifting or strenuous activity with the left arm for at least 1 week. After that, may perform activities with left arm  as long as you are pain-free.  Wearing a good support bra or sports bra minimizes pain and swelling.  You may have sexual intercourse when it is comfortable. a. You may drive when you no longer are taking prescription pain medication, you can comfortably wear a seatbelt, and you can safely maneuver your car and apply brakes. b. RETURN TO WORK:  ______________________________________________________________________________________ 9. You should see your doctor in the office for a follow-up appointment approximately two weeks after your surgery.   Expect your pathology report 3 business days after your surgery.  You may call to check if you do not hear from [place] after three days. 10. OTHER INSTRUCTIONS: _______________________________________________________________________________________________ _____________________________________________________________________________________________________________________________________ _____________________________________________________________________________________________________________________________________ _____________________________________________________________________________________________________________________________________  WHEN TO CALL YOUR DOCTOR: 1. Fever over 101.0 2. Nausea and/or vomiting. 3. Extreme swelling or bruising. 4. Continued bleeding from incision. 5. Increased pain, redness, or drainage from the incision.  The clinic staff is available to answer your questions during regular business hours.  Please dont hesitate to call and ask to speak to one of the nurses for clinical concerns.  If you have a medical  emergency, go to the nearest emergency room or call 911.  A surgeon from [facility] is always on call  at the hospital. ° °For further questions, please visit [email or web address]  °

## 2015-09-09 NOTE — Transfer of Care (Signed)
Immediate Anesthesia Transfer of Care Note  Patient: [name]  Procedure(s) Performed: Procedure(s): LEFT BREAST LUMPECTOMY WITH RADIOACTIVE SEED AND SENTINEL LYMPH NODE BIOPSY (Left)  Patient Location: PACU  Anesthesia Type:General  Level of Consciousness: awake, alert  and oriented  Airway & Oxygen Therapy: Patient Spontanous Breathing and Patient connected to nasal cannula oxygen  Post-op Assessment: Report given to RN, Post -op Vital signs reviewed and stable and Patient moving all extremities X 4  Post vital signs: Reviewed and stable  Last Vitals:  Filed Vitals:   09/09/15 1047 09/09/15 1048  BP: 169/53   Pulse: 65 65  Temp: 36.7 C   Resp: 20     Last Pain: There were no vitals filed for this visit.    Patients Stated Pain Goal: 2 ([id number])  Complications: No apparent anesthesia complications

## 2015-09-09 NOTE — Anesthesia Preprocedure Evaluation (Addendum)
Anesthesia Evaluation  Patient identified by MRN, date of birth, ID band Patient awake    Reviewed: Allergy & Precautions, NPO status , Patient's Chart, lab work & pertinent test results  Airway Mallampati: II  TM Distance: >3 FB Neck ROM: Full    Dental no notable dental hx. (+) Dental Advisory Given   Pulmonary neg pulmonary ROS,    Pulmonary exam normal breath sounds clear to auscultation       Cardiovascular hypertension, Pt. on medications Normal cardiovascular exam Rhythm:Regular Rate:Normal     Neuro/Psych negative neurological ROS     GI/Hepatic Neg liver ROS, GERD  ,  Endo/Other  Hypothyroidism   Renal/GU Renal InsufficiencyRenal disease     Musculoskeletal  (+) Arthritis ,   Abdominal   Peds  Hematology negative hematology ROS (+)   Anesthesia Other Findings   Reproductive/Obstetrics                            Lab Results  Component Value Date   WBC 8.4 09/07/2015   HGB 14.7 09/07/2015   HCT 44.6 09/07/2015   MCV 85.7 09/07/2015   PLT 260 09/07/2015   Lab Results  Component Value Date   CREATININE 1.3* 09/07/2015   BUN 26.5* 09/07/2015   NA 143 09/07/2015   K 4.1 09/07/2015   CL 103 09/05/2015   CO2 29 09/07/2015    Anesthesia Physical Anesthesia Plan  ASA: II  Anesthesia Plan: General and Regional   Post-op Pain Management: GA combined w/ Regional for post-op pain   Induction: Intravenous  Airway Management Planned: LMA  Additional Equipment:   Intra-op Plan:   Post-operative Plan: Extubation in OR  Informed Consent: I have reviewed the patients History and Physical, chart, labs and discussed the procedure including the risks, benefits and alternatives for the proposed anesthesia with the patient or authorized representative who has indicated his/her understanding and acceptance.   Dental advisory given  Plan Discussed with: CRNA  Anesthesia Plan  Comments:         Anesthesia Quick Evaluation

## 2015-09-09 NOTE — H&P (View-Only) (Signed)
Oluwaseyi L. [facility] 08/29/2015 11:26 AM Location: [facility] Patient #: [id number] DOB: 1941/02/18 Married / Language: [name] / Race: White Female  History of Present Illness [name]; 08/29/2015 12:16 PM) The patient is a 75 year old female.   Note:She is referred by Dr. 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 She underwent a screening mammogram and an abnormal area was noted in the upper outer quadrant of the left breast. This was a spiculated lesion measuring 8-9 mm. Ultrasound of left axilla was negative. Image guided biopsy was performed demonstrating an invasive mammary cancer, ER/PR positive, HER-2 negative, Ki67 = 10%. She has no complaints about her breast prior to the biopsy. Her mother had breast cancer at the age of 65. Her paternal great grandmother also had breast cancer. Age at menarche was 61. She's not had any children. Age of menopause was before 68. She is here today with her husband.  Other Problems Elbert Ewings, CMA; 08/29/2015 11:26 AM) Breast Cancer Gastric Ulcer Gastroesophageal Reflux Disease Heart murmur High blood pressure Hypercholesterolemia Lump In Breast Migraine Headache Thyroid Disease  Diagnostic Studies History Elbert Ewings, CMA; 08/29/2015 11:26 AM) Mammogram within last year  Allergies Elbert Ewings, CMA; 08/29/2015 11:27 AM) Nitrofurantoin *URINARY ANTI-INFECTIVES* Sulfa 10 *OPHTHALMIC AGENTS*  Medication History Elbert Ewings, CMA; 08/29/2015 11:30 AM) Venlafaxine HCl ER (150MG Capsule ER 24HR, Oral daily) Active. Atenolol (50MG Tablet, Oral) Active. Synthroid (50MCG Tablet, Oral) Active. HydroCHLOROthiazide (12.5MG Capsule, Oral) Active. Vitamin D (1000UNIT Tablet, Oral) Active. Omega 3 (1200MG Capsule, Oral) Active. Turmeric (450MG Capsule, Oral) Active. Centrum Silver (Oral) Active. Zegerid (20-1100MG Capsule, Oral half a tablet as needed) Active. Green  Tea Active. Medications Reconciled  Social History Elbert Ewings, [place]; 08/29/2015 11:26 AM) Alcohol use Occasional alcohol use. No caffeine use No drug use Tobacco use Never smoker.  Family History Elbert Ewings, [place]; 08/29/2015 11:26 AM) Cerebrovascular Accident Father. Colon Cancer Father. Heart Disease Father. Heart disease in female family member before age 48 Migraine Headache Family Members In General, Father. Rectal Cancer Father.  Pregnancy / Birth History Elbert Ewings, CMA; 08/29/2015 11:26 AM) Age at menarche 49 years. Age of menopause 71-50 Gravida 0 Para 0     Review of Systems Elbert Ewings CMA; 08/29/2015 11:26 AM) General Not Present- Appetite Loss, Chills, Fatigue, Fever, Night Sweats, Weight Gain and Weight Loss. Skin Not Present- Change in Wart/Mole, Dryness, Hives, Jaundice, New Lesions, Non-Healing Wounds, Rash and Ulcer. HEENT Present- Seasonal Allergies and Wears glasses/contact lenses. Not Present- Earache, Hearing Loss, Hoarseness, Nose Bleed, Oral Ulcers, Ringing in the Ears, Sinus Pain, Sore Throat, Visual Disturbances and Yellow Eyes. Respiratory Present- Snoring. Not Present- Bloody sputum, Chronic Cough, Difficulty Breathing and Wheezing. Breast Present- Breast Mass. Not Present- Breast Pain, Nipple Discharge and Skin Changes. Cardiovascular Not Present- Chest Pain, Difficulty Breathing Lying Down, Leg Cramps, Palpitations, Rapid Heart Rate, Shortness of Breath and Swelling of Extremities. Gastrointestinal Present- Constipation. Not Present- Abdominal Pain, Bloating, Bloody Stool, Change in Bowel Habits, Chronic diarrhea, Difficulty Swallowing, Excessive gas, Gets full quickly at meals, Hemorrhoids, Indigestion, Nausea, Rectal Pain and Vomiting. Female Genitourinary Present- Nocturia. Not Present- Frequency, Painful Urination, Pelvic Pain and Urgency. Musculoskeletal Not Present- Back Pain, Joint Pain, Joint Stiffness, Muscle Pain, Muscle  Weakness and Swelling of Extremities. Neurological Not Present- Decreased Memory, Fainting, Headaches, Numbness, Seizures, Tingling, Tremor, Trouble walking and Weakness. Psychiatric Present- Anxiety. Not Present- Bipolar, Change in Sleep Pattern, Depression, Fearful and Frequent crying. Hematology Not Present- Easy Bruising, Excessive  bleeding, Gland problems, HIV and Persistent Infections.  Vitals Elbert Ewings CMA; 08/29/2015 11:31 AM) 08/29/2015 11:30 AM Weight: 149.8 lb Height: 61in Body Surface Area: 1.67 m Body Mass Index: 28.3 kg/m  Temp.: 98.23F  Pulse: 88 (Regular)  BP: 132/80 (Sitting, Left Arm, Standard)      Physical Exam [name]; 08/29/2015 12:17 PM)  The physical exam findings are as follows: Note:General: WDWN in NAD. Pleasant and cooperative.  HEENT: [place]/AT, no facial masses  EYES: EOMI, no icterus  NECK: Supple, no obvious mass or thyroid enlargement.  CV: RRR.  CHEST: Breath sounds equal and clear. Respirations nonlabored.  BREASTS: Symmetrical in size. No dominant masses, nipple discharge or suspicious skin lesions. There is a small bruise in the left lateral breast.  ABDOMEN: Soft, nontender, nondistended, no masses, no organomegaly.  MUSCULOSKELETAL: FROM, good muscle tone, no edema, no venous stasis changes  LYMPHATIC: No palpable cervical, supraclavicular, axillary adenopathy.  SKIN: No jaundice.  NEUROLOGIC: Alert and oriented, answers questions appropriately, normal gait and station.  PSYCHIATRIC: Slightly anxious.    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 We discussed sentinel lymph node biopsy as well. I also recommended referral to medical oncology, radiation  oncology, and genetics. She agrees with these recommendations.  Plan: Referrals to medical oncology, radiation oncology, genetics. Following that, left breast lumpectomy after radioactive seed localization and left axillary sentinel lymph node biopsy. I have explained the procedure, risks, and aftercare. The risks include but are not limited to bleeding, infection, wound problems, seroma formation, cosmetic deformity, anesthesia, nerve injury, lymphedema. She seems to understand and agrees with the plan.  Jackolyn Confer, MD

## 2015-09-09 NOTE — Anesthesia Procedure Notes (Addendum)
Anesthesia Regional Block:  Pectoralis block  Pre-Anesthetic Checklist: ,, timeout performed, Correct Patient, Correct Site, Correct Laterality, Correct Procedure, Correct Position, site marked, Risks and benefits discussed,  Surgical consent,  Pre-op evaluation,  At surgeon's request and post-op pain management  Laterality: Left     Needles:   Needle Type: Echogenic Needle     Needle Length: 10cm 10 cm Needle Gauge: 21 and 21 G    Additional Needles:  Procedures: ultrasound guided (picture in chart) Pectoralis block Narrative:  Start time: 09/09/2015 11:45 AM End time: 09/09/2015 11:52 AM Injection made incrementally with aspirations every 5 mL.  Performed by: Personally  Anesthesiologist: Suzette Battiest   Procedure Name: LMA Insertion Date/Time: 09/09/2015 1:49 PM Performed by: Suzy Bouchard Pre-anesthesia Checklist: Patient identified, Emergency Drugs available, Suction available, Patient being monitored and Timeout performed Patient Re-evaluated:Patient Re-evaluated prior to inductionOxygen Delivery Method: Circle system utilized Preoxygenation: Pre-oxygenation with 100% oxygen Intubation Type: IV induction Ventilation: Mask ventilation without difficulty LMA: LMA inserted LMA Size: 4.0 Number of attempts: 1 Placement Confirmation: positive ETCO2 and breath sounds checked- equal and bilateral Tube secured with: Tape Dental Injury: Teeth and Oropharynx as per pre-operative assessment

## 2015-09-12 ENCOUNTER — Encounter (HOSPITAL_COMMUNITY): Payer: Self-pay | Admitting: General Surgery

## 2015-09-13 ENCOUNTER — Telehealth: Payer: Self-pay | Admitting: *Deleted

## 2015-09-13 NOTE — Telephone Encounter (Signed)
Called pt with navigation resources and contact information. Relate doing well and without questions at this time. Informed that she was feeling well after sx. Denies needs at this time. Encourage pt to call with concerns or questions. Received verbal understanding.

## 2015-09-13 NOTE — Anesthesia Postprocedure Evaluation (Signed)
Anesthesia Post Note  Patient: TIMYAH TEBBE  Procedure(s) Performed: Procedure(s) (LRB): LEFT BREAST LUMPECTOMY WITH RADIOACTIVE SEED AND SENTINEL LYMPH NODE BIOPSY (Left)  Patient location during evaluation: PACU Anesthesia Type: General Level of consciousness: awake and alert Pain management: pain level controlled Vital Signs Assessment: post-procedure vital signs reviewed and stable Respiratory status: spontaneous breathing, nonlabored ventilation, respiratory function stable and patient connected to nasal cannula oxygen Cardiovascular status: blood pressure returned to baseline and stable Postop Assessment: no signs of nausea or vomiting Anesthetic complications: no    Last Vitals:  Filed Vitals:   09/09/15 1545 09/09/15 1610  BP:  154/56  Pulse: 60 63  Temp:    Resp: 20     Last Pain:  Filed Vitals:   09/09/15 1722  PainSc: 0-No pain                 Shakesha Soltau S

## 2015-09-15 ENCOUNTER — Encounter: Payer: Self-pay | Admitting: General Surgery

## 2015-09-15 NOTE — Progress Notes (Signed)
Pathology is a T1b N0, ER/PR positive.  We discussed the pathology over the phone-tumor out with negative margins, lymph nodes negative for tumor.  She seemed to understand.

## 2015-09-19 ENCOUNTER — Other Ambulatory Visit: Payer: Self-pay | Admitting: Family Medicine

## 2015-09-22 DIAGNOSIS — C50412 Malignant neoplasm of upper-outer quadrant of left female breast: Secondary | ICD-10-CM | POA: Diagnosis not present

## 2015-09-22 DIAGNOSIS — Z170 Estrogen receptor positive status [ER+]: Secondary | ICD-10-CM | POA: Diagnosis not present

## 2015-09-23 ENCOUNTER — Ambulatory Visit: Payer: Self-pay | Admitting: Genetic Counselor

## 2015-09-23 ENCOUNTER — Encounter (HOSPITAL_COMMUNITY): Payer: Self-pay

## 2015-09-23 ENCOUNTER — Encounter: Payer: Self-pay | Admitting: Genetic Counselor

## 2015-09-23 ENCOUNTER — Telehealth: Payer: Self-pay | Admitting: Genetic Counselor

## 2015-09-23 ENCOUNTER — Telehealth: Payer: Self-pay | Admitting: *Deleted

## 2015-09-23 DIAGNOSIS — Z800 Family history of malignant neoplasm of digestive organs: Secondary | ICD-10-CM

## 2015-09-23 DIAGNOSIS — Z1379 Encounter for other screening for genetic and chromosomal anomalies: Secondary | ICD-10-CM | POA: Insufficient documentation

## 2015-09-23 DIAGNOSIS — Z803 Family history of malignant neoplasm of breast: Secondary | ICD-10-CM

## 2015-09-23 DIAGNOSIS — C50412 Malignant neoplasm of upper-outer quadrant of left female breast: Secondary | ICD-10-CM

## 2015-09-23 NOTE — Telephone Encounter (Signed)
Revealed negative genetic testing on the Breast/Ovarian cancer panel.  Discussed that this negative test suggests that her cancer may be due to more sporadic form of cancer.

## 2015-09-23 NOTE — Telephone Encounter (Signed)
Received Oncotype score of 29/19% Scheduled and confirmed appt with Dr. [name] for 09/26/15 at 3pm for discussion.

## 2015-09-23 NOTE — Progress Notes (Signed)
HPI: Ms. Gammon was previously seen in the [place] clinic due to a Personal and family history of cancer and concerns regarding a hereditary predisposition to cancer. Please refer to our prior cancer genetics clinic note for more information regarding Ms. Ebrahim's medical, social and family histories, and our assessment and recommendations, at the time. Ms. Sircy recent genetic test results were disclosed to her, as were recommendations warranted by these results. These results and recommendations are discussed in more detail below.  FAMILY HISTORY:  We obtained a detailed, 4-generation family history.  Significant diagnoses are listed below: Family History  Problem Relation Age of Onset  . Breast cancer Mother     Age 35  . Hypertension Mother   . Heart disease Father   . Stroke Father   . Colon cancer Father     dx 89s  . Breast cancer Paternal Grandmother     Age 60  . Diabetes Paternal Grandmother   . Heart disease Maternal Grandmother     The patient does not have children. She has two brothers who are healthy and cancer free. Her mother was diagnosed with breast cancer at age 21 and 32 and died at 99. Her father was diagnosed with colon cancer in his 45s and died at 64. Her mother had one brother and one sister who were cancer free when they died. The patient's maternal grandparents died at age 47 and 14. The patient's father had two sisters and one brother. His brother's two granddaughters had breast cancer. His mother was also diagnosed with breast cancer at age 106 and his father died at 58. Patient's maternal ancestors are of [place] and [place] descent, and paternal ancestors are of [place] descent. There is no reported Ashkenazi Jewish ancestry. There is no known consanguinity.  GENETIC TEST RESULTS: At the time of Ms. Donaghue's visit, we recommended she pursue genetic testing of the Breast/Ovarian cancer gene panel. The Breast/Ovarian gene panel  offered by [facility] includes sequencing and rearrangement analysis for the following 20 genes:  ATM, BARD1, BRCA1, BRCA2, BRIP1, CDH1, CHEK2, EPCAM, FANCC, MLH1, MSH2, MSH6, NBN, PALB2, PMS2, PTEN, RAD51C, RAD51D, TP53, and XRCC2.   The report date is September 19, 2015.  Genetic testing was normal, and did not reveal a deleterious mutation in these genes. The test report has been scanned into EPIC and is located under the Molecular Pathology section of the Results Review tab.   We discussed with Ms. Tiegs that since the current genetic testing is not perfect, it is possible there may be a gene mutation in one of these genes that current testing cannot detect, but that chance is small. We also discussed, that it is possible that another gene that has not yet been discovered, or that we have not yet tested, is responsible for the cancer diagnoses in the family, and it is, therefore, important to remain in touch with cancer genetics in the future so that we can continue to offer Ms. Stricker the most up to date genetic testing.   CANCER SCREENING RECOMMENDATIONS: This result is reassuring and indicates that Ms. Embree likely does not have an increased risk for a future cancer due to a mutation in one of these genes. This normal test also suggests that Ms. Matzen's cancer was most likely not due to an inherited predisposition associated with one of these genes.  Most cancers happen by chance and this negative test suggests that her cancer falls into this category.  We, therefore, recommended she  continue to follow the cancer management and screening guidelines provided by her oncology and primary healthcare provider.   RECOMMENDATIONS FOR FAMILY MEMBERS: Women in this family might be at some increased risk of developing cancer, over the general population risk, simply due to the family history of cancer. We recommended women in this family have a yearly mammogram beginning at age 74, or 95 years younger than  the earliest onset of cancer, an an annual clinical breast exam, and perform monthly breast self-exams. Women in this family should also have a gynecological exam as recommended by their primary provider. All family members should have a colonoscopy by age 52.  FOLLOW-UP: Lastly, we discussed with Ms. Liggett that cancer genetics is a rapidly advancing field and it is possible that new genetic tests will be appropriate for her and/or her family members in the future. We encouraged her to remain in contact with cancer genetics on an annual basis so we can update her personal and family histories and let her know of advances in cancer genetics that may benefit this family.   Our contact number was provided. Ms. Bills questions were answered to her satisfaction, and she knows she is welcome to call us at anytime with additional questions or concerns.   Roma Kayser, MS, [facility] Certified Genetic Counselor [email or web address]

## 2015-09-26 ENCOUNTER — Ambulatory Visit (HOSPITAL_BASED_OUTPATIENT_CLINIC_OR_DEPARTMENT_OTHER): Payer: Medicare Other | Admitting: Oncology

## 2015-09-26 ENCOUNTER — Ambulatory Visit: Payer: Self-pay | Admitting: General Surgery

## 2015-09-26 ENCOUNTER — Encounter: Payer: Self-pay | Admitting: *Deleted

## 2015-09-26 VITALS — BP 153/56 | HR 67 | Temp 97.9°F | Resp 18 | Ht 61.0 in | Wt 148.0 lb

## 2015-09-26 DIAGNOSIS — Z170 Estrogen receptor positive status [ER+]: Secondary | ICD-10-CM

## 2015-09-26 DIAGNOSIS — C50412 Malignant neoplasm of upper-outer quadrant of left female breast: Secondary | ICD-10-CM

## 2015-09-26 MED ORDER — PROCHLORPERAZINE MALEATE 10 MG PO TABS: 10.0000 mg | ORAL_TABLET | Freq: Four times a day (QID) | ORAL | Status: DC | PRN

## 2015-09-26 MED ORDER — LIDOCAINE-PRILOCAINE 2.5-2.5 % EX CREA: TOPICAL_CREAM | CUTANEOUS | Status: DC

## 2015-09-26 MED ORDER — DEXAMETHASONE 4 MG PO TABS: 8.0000 mg | ORAL_TABLET | Freq: Two times a day (BID) | ORAL | Status: DC

## 2015-09-26 NOTE — Progress Notes (Signed)
[place]  [phone] [phone]     ID: ERILYN PEARMAN DOB: [birth year]  MR#: [id number]  HRC#:[id number]  Patient Care Team: [name], MD as PCP - General Chauncey Cruel, MD as Consulting Physician (Oncology) Jackolyn Confer, MD as Consulting Physician (General Surgery) Eppie Gibson, MD as Attending Physician (Radiation Oncology) Richmond Campbell, MD as Consulting Physician (Gastroenterology) Lavonna Monarch, MD as Consulting Physician (Dermatology) PCP: [name], MD OTHER MD:  CHIEF COMPLAINT: Estrogen receptor positive breast cancer  CURRENT TREATMENT: Adjuvant chemotherapy with cyclophosphamide and docetaxel   BREAST CANCER HISTORY: From the original intake note:  [name] had bilateral screening mammography with tomography scheduled routinely 08/01/2015 at the [facility]. This showed a possible mass in the left breast. Accordingly on 08/15/2015 she underwent left diagnostic mammography with tomography and ultrasonography. This found the breast density to be category be. On spot compression views and was a 0.9 cm spiculated mass in the upper outer left breast, which on ultrasound was at 2:30 o'clock 5 cm from the nipple and measured 0.8 cm. Ultrasound of the axilla was benign.  Biopsy of the left breast mass in question 08/19/2015 found ([id number]) an invasive ductal carcinoma (E-cadherin positive) which was estrogen receptor 100% positive, progesterone receptor 100% positive, both with strong staining intensity, with an MIB-1 of 10%, and no HER-2 amplification, the signals ratio being 1.23 and the number per cell 1.85.  Her subsequent history is as detailed below  INTERVAL HISTORY: [name] returns today for follow-up of her estrogen receptor positive breast cancer accompanied by her husband [name]. Since her last visit here she underwent left lumpectomy and sentinel lymph node sampling, on 09/09/2015. The final pathology ([id number]  [id number]) showed an invasive ductal carcinoma measuring 0.9 cm, grade 1, with negative margins. Repeat HER-2 was again negative. Both sentinel lymph nodes were clear.  An Oncotype DX was obtained from this material. It showed a recurrence score of 29, predicting a 19% risk of recurrence outside the breast within the next 10 years if the patient's only systemic therapy is tamoxifen for 5 years. The predicted benefit of chemotherapy is an additional risk reduction of 8%. The patient and her husband are here today to discuss those results   REVIEW OF SYSTEMS: She did well with the surgery, with mild pain, for which she took Norco up 2 or 3 days. She had no problems with bleeding or fever. She is "pretty much back to normal" doing all her housework and driving, although not yet vacuuming. A detailed review of systems today was otherwise noncontributory  PAST MEDICAL HISTORY: Past Medical History  Diagnosis Date  . Anemia   . GERD (gastroesophageal reflux disease)   . Headache(784.0)   . Ulcer   . Kidney stones   . Rheumatic fever     age 75  . UTI (lower urinary tract infection)   . Osteopenia   . Hyperlipidemia   . Thyroid disease     Hypothyroid  . Osteoporosis   . Atrophic vaginitis   . Hypertension   . Endometriosis   . Hypothyroidism   . Anxiety   . Arthritis   . Cancer [facility])     breast cancer  . Family history of breast cancer   . Family history of colon cancer     PAST SURGICAL HISTORY: Past Surgical History  Procedure Laterality Date  . Laparoscopic endometriosis fulguration  1978  . Ulnar nerve repair  2010  . Tonsillectomy    .  Pelvic laparoscopy    . Breast lumpectomy with radioactive seed and sentinel lymph node biopsy Left 09/09/2015    Procedure: LEFT BREAST LUMPECTOMY WITH RADIOACTIVE SEED AND SENTINEL LYMPH NODE BIOPSY;  Surgeon: Jackolyn Confer, MD;  Location: [facility];  Service: General;  Laterality: Left;    FAMILY HISTORY Family History  Problem Relation Age  of Onset  . Breast cancer Mother     Age 42  . Hypertension Mother   . Heart disease Father   . Stroke Father   . Colon cancer Father     dx 97s  . Breast cancer Paternal Grandmother     Age 2  . Diabetes Paternal Grandmother   . Heart disease Maternal Grandmother   The patient's father died at age 34 the patient's mother was diagnosed with breast cancer at age 18 and again at age 42, which was the year of her death. In addition a paternal grandmother was diagnosed with breast cancer in her 20s. The patient's father had colon cancer. The patient had 2 brothers, no sisters. There is no history of ovarian cancer in the family.  GYNECOLOGIC HISTORY:  No LMP recorded. Patient is postmenopausal. Menarche age 53. The patient is GX P0. She went through menopause in her late 31s. She did not take hormone replacement. She never used oral contraceptives.  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 Her husband [name] [name]") is retired from [employer]. They have an adopted son, [name], who is completing a second Engineer, production degree at [employer] state area he carries a diagnosis of schizophrenia, but is obviously very functional. The patient has no grandchildren. She is a Chief of Staff    ADVANCED DIRECTIVES:  NOT IN PLACE   HEALTH MAINTENANCE: Social History  Substance Use Topics  . Smoking status: Never Smoker   . Smokeless tobacco: Not on file  . Alcohol Use: No     Colonoscopy: 2012/[facility]   PAP: 2013?   Bone density: 2012/osteopenia   Lipid panel:  Allergies  Allergen Reactions  . Nitrofurantoin Nausea And Vomiting    REACTION: GI upset  . Sulfamethoxazole-Trimethoprim Nausea And Vomiting    REACTION: GI upset    Current Outpatient Prescriptions  Medication Sig Dispense Refill  . atenolol (TENORMIN) 50 MG tablet TAKE 1 TABLET (50 MG TOTAL) BY MOUTH DAILY. 30 tablet 5  . Cholecalciferol (VITAMIN D) 2000 UNITS CAPS Take 1 capsule by mouth daily.       . fish oil-omega-3 fatty acids 1000 MG capsule Take 2 g by mouth daily.      . hydrochlorothiazide (HYDRODIURIL) 25 MG tablet TAKE 1 TABLET (25 MG TOTAL) BY MOUTH DAILY. 30 tablet 11  . HYDROcodone-acetaminophen (NORCO/VICODIN) 5-325 MG tablet Take 1-2 tablets by mouth every 4 (four) hours as needed for moderate pain or severe pain. 30 tablet 0  . levothyroxine (SYNTHROID, LEVOTHROID) 50 MCG tablet TAKE 1 TABLET BY MOUTH DAILY BEFORE BREAKFAST 30 tablet 5  . Multiple Vitamin (MULTIVITAMIN WITH MINERALS) TABS tablet Take 1 tablet by mouth daily.    Earney Navy Bicarbonate (ZEGERID) 20-1100 MG CAPS capsule Take 1 capsule by mouth daily before breakfast.    . ondansetron (ZOFRAN) 4 MG tablet Take 1 tablet (4 mg total) by mouth every 4 (four) hours as needed for nausea. 10 tablet 0  . OVER THE COUNTER MEDICATION Take 1 capsule by mouth daily. Tumeric 467m    . simvastatin (ZOCOR) 40 MG tablet TAKE 1 TABLET (40 MG TOTAL) BY MOUTH AT BEDTIME.  30 tablet 5  . venlafaxine XR (EFFEXOR-XR) 150 MG 24 hr capsule Take 1 capsule (150 mg total) by mouth daily. 90 capsule 3  . zolpidem (AMBIEN) 5 MG tablet Take one to two tablets qhs prn insomnia (Patient taking differently: Take 5-10 mg by mouth at bedtime as needed for sleep. Take one to two tablets qhs prn insomnia) 60 tablet 1   No current facility-administered medications for this visit.    OBJECTIVE: Middle-aged white womanIn no acute distress  Filed Vitals:   09/26/15 1515  BP: 153/56  Pulse: 67  Temp: 97.9 F (36.6 C)  Resp: 18     Body mass index is 27.98 kg/(m^2).    ECOG FS:1 - Symptomatic but completely ambulatory  Sclerae unicteric, pupils round and equal Oropharynx clear and moist-- no thrush or other lesions No cervical or supraclavicular adenopathy Lungs no rales or rhonchi Heart regular rate and rhythm Abd soft, nontender, positive bowel sounds MSK no focal spinal tenderness, no upper extremity lymphedema Neuro: nonfocal,  well oriented, appropriate affect Breasts: The right breast is unremarkable. The left breast is status post lumpectomy. The incision is healing nicely. The cosmetic result is good. There is no dehiscence, erythema, and only minimal swelling in the breast. There is no evidence of residual or recurrent disease. The left axilla is benign.  LAB RESULTS:  CMP     Component Value Date/Time   NA 143 09/07/2015 1533   NA 140 09/05/2015 1558   K 4.1 09/07/2015 1533   K 4.4 09/05/2015 1558   CL 103 09/05/2015 1558   CO2 29 09/07/2015 1533   CO2 29 09/05/2015 1558   GLUCOSE 113 09/07/2015 1533   GLUCOSE 103* 09/05/2015 1558   BUN 26.5* 09/07/2015 1533   BUN 22* 09/05/2015 1558   CREATININE 1.3* 09/07/2015 1533   CREATININE 1.22* 09/05/2015 1558   CALCIUM 10.0 09/07/2015 1533   CALCIUM 10.1 09/05/2015 1558   PROT 7.7 09/07/2015 1533   PROT 7.4 09/05/2015 1558   ALBUMIN 4.4 09/07/2015 1533   ALBUMIN 4.5 09/05/2015 1558   AST 20 09/07/2015 1533   AST 24 09/05/2015 1558   ALT 20 09/07/2015 1533   ALT 23 09/05/2015 1558   ALKPHOS 68 09/07/2015 1533   ALKPHOS 65 09/05/2015 1558   BILITOT 0.34 09/07/2015 1533   BILITOT 0.6 09/05/2015 1558   GFRNONAA 42* 09/05/2015 1558   GFRAA 49* 09/05/2015 1558    INo results found for: SPEP, UPEP  Lab Results  Component Value Date   WBC 8.4 09/07/2015   NEUTROABS 4.6 09/07/2015   HGB 14.7 09/07/2015   HCT 44.6 09/07/2015   MCV 85.7 09/07/2015   PLT 260 09/07/2015      Chemistry      Component Value Date/Time   NA 143 09/07/2015 1533   NA 140 09/05/2015 1558   K 4.1 09/07/2015 1533   K 4.4 09/05/2015 1558   CL 103 09/05/2015 1558   CO2 29 09/07/2015 1533   CO2 29 09/05/2015 1558   BUN 26.5* 09/07/2015 1533   BUN 22* 09/05/2015 1558   CREATININE 1.3* 09/07/2015 1533   CREATININE 1.22* 09/05/2015 1558      Component Value Date/Time   CALCIUM 10.0 09/07/2015 1533   CALCIUM 10.1 09/05/2015 1558   ALKPHOS 68 09/07/2015 1533   ALKPHOS  65 09/05/2015 1558   AST 20 09/07/2015 1533   AST 24 09/05/2015 1558   ALT 20 09/07/2015 1533   ALT 23 09/05/2015 1558   BILITOT  0.34 09/07/2015 1533   BILITOT 0.6 09/05/2015 1558       No results found for: LABCA2  No components found for: [id number]  No results for input(s): INR in the last 168 hours.  Urinalysis    Component Value Date/Time   BILIRUBINUR 1+ 09/28/2014 1124   PROTEINUR 2+ 09/28/2014 1124   UROBILINOGEN 1.0 09/28/2014 1124   NITRITE positive 09/28/2014 1124   LEUKOCYTESUR large (3+)* 09/28/2014 1124      ELIGIBLE FOR AVAILABLE RESEARCH PROTOCOL: no  STUDIES: Chest 2 View  09/05/2015  CLINICAL DATA:  75 year old female. Preoperative respiratory examination prior to lumpectomy for breast cancer. EXAM: CHEST  2 VIEW COMPARISON:  06/21/2004 FINDINGS: Upper limits normal heart size again noted. There is no evidence of focal airspace disease, pulmonary edema, suspicious pulmonary nodule/mass, pleural effusion, or pneumothorax. No acute bony abnormalities are identified. A moderate -severe thoracolumbar scoliosis again noted. IMPRESSION: No active cardiopulmonary disease. Electronically Signed   By: Margarette Canada M.D.   On: 09/05/2015 17:18   Nm Sentinel Node Inj-no Rpt (breast)  09/09/2015  CLINICAL DATA: Invasive left breast cancer Sulfur colloid was injected intradermally by the nuclear medicine technologist for breast cancer sentinel node localization.   Mm Breast Surgical Specimen  09/09/2015  CLINICAL DATA:  Biopsy proven left breast invasive mammary carcinoma. EXAM: SPECIMEN RADIOGRAPH OF THE LEFT BREAST COMPARISON:  Previous exam(s). FINDINGS: Status post excision of the left breast. The radioactive seed and biopsy marker clip are present, completely intact, and were marked for pathology. IMPRESSION: Specimen radiograph of the left breast. Electronically Signed   By: Lillia Mountain M.D.   On: 09/09/2015 14:54   Mm Lt Radioactive Seed Loc Mammo Guide  09/08/2015   CLINICAL DATA:  75 year old female with recently diagnosed invasive mammary carcinoma of the left breast. EXAM: MAMMOGRAPHIC GUIDED RADIOACTIVE SEED LOCALIZATION OF THE LEFT BREAST COMPARISON:  Previous exam(s). FINDINGS: Patient presents for radioactive seed localization prior to left breast lumpectomy. I met with the patient and we discussed the procedure of seed localization including benefits and alternatives. We discussed the high likelihood of a successful procedure. We discussed the risks of the procedure including infection, bleeding, tissue injury and further surgery. We discussed the low dose of radioactivity involved in the procedure. Informed, written consent was given. The usual time-out protocol was performed immediately prior to the procedure. Using mammographic guidance, sterile technique, 1% lidocaine and an I-125 radioactive seed, the mass with associated ribbon shaped biopsy marking clip was localized using a lateral to medial approach. The follow-up mammogram images confirm the seed in the expected location and were marked for Dr. Zella Richer. Follow-up survey of the patient confirms presence of the radioactive seed. Order number of I-125 seed:  [id number]. Total activity:  6.195 millicuries  Reference Date: 09/02/2015 The patient tolerated the procedure well and was released from the [place]. She was given instructions regarding seed removal. IMPRESSION: Radioactive seed localization left breast. No apparent complications. Electronically Signed   By: Everlean Alstrom M.D.   On: 09/08/2015 13:37    ASSESSMENT: 75 y.o. [place] woman status post left breast upper outer quadrant biopsy 08/19/2015 for a clinical T1b N0, stage IA invasive ductal carcinoma, strongly estrogen and progesterone receptor positive, HER-2 not amplified, with an MIB-1 of 10%  (1) left lumpectomy and sentinel lymph node sampling 09/09/2015 showed apT1b pN0, stage IA invasive ductal carcinoma, grade 1, repeat HER-2  again negative.   (2) Oncotype DX score of 29 predicts a 19% risk of recurrence  outside the breast within the next 10 years if the patient's only systemic therapy is tamoxifen for 5 years. It also predicts a further risk reduction with chemotherapy of 8%.  (3) adjuvant chemotherapy will consist of cyclophosphamide and docetaxel given every 21 days 4, to start 10/20/2015  (4) adjuvant radiation to follow chemotherapy  (5) anti-estrogen therapy to follow at the completion of local treatment  (6) genetics testing 09/19/2015 through the Breast/Ovarian gene panel offered by [facility] found no deleterious mutations in ATM, BARD1, BRCA1, BRCA2, BRIP1, CDH1, CHEK2, EPCAM, FANCC, MLH1, MSH2, MSH6, NBN, PALB2, PMS2, PTEN, RAD51C, RAD51D, TP53, and XRCC2.  PLAN: I spent approximately 40 minutes today with [name] going over her situation in detail. She understands that although her breast cancer is small, it is dangerous. In our database, of 59 women with cancers like hers who only took tamoxifen for 5 years, 25 were found to have metastatic disease from their breast cancer within 10 years. The ones who in addition to tamoxifen to chemotherapy, ended up with an 11% risk of metastatic recurrence.  These figures were compelling to her so we discussed chemotherapy, which in her case will consist of cyclophosphamide and docetaxel 4. She has a good understanding of the possible toxicities, side effects and complications of these agents. She will also come to "chemotherapy school" to get further orientation.  She will need a port, but she would prefer not to have her port placed or her chemotherapy started until after the July 4 holiday. We will see if her surgeon can accommodate. Tentatively I have scheduled her to start her chemotherapy here on 10/20/2015. She will see me the same day to discuss anti-emetics and the use of dexamethasone on days 0.  In summary, the plan will be for adjuvant chemotherapy to be  followed by adjuvant radiation and then anti-estrogens for a minimum of 5 years  Melvin has a good understanding of this plan. She agrees to it. She knows the goal of treatment in her case is cure. She will call with any problems that may develop before her next visit.  :Chauncey Cruel, MD   09/26/2015 3:23 PM Medical Oncology and Hematology [facility] [place], [place] [address] Tel. [phone]    Fax. [phone]

## 2015-09-27 ENCOUNTER — Telehealth: Payer: Self-pay | Admitting: Oncology

## 2015-09-27 NOTE — Telephone Encounter (Signed)
appt made and patient notified of 6/19 date/time

## 2015-09-28 ENCOUNTER — Ambulatory Visit: Payer: Self-pay | Admitting: General Surgery

## 2015-09-28 NOTE — H&P (Signed)
[name] 09/26/2015 1:54 PM Location: [facility] Patient #: [id number] DOB: [birth year] Married / Language: [name] / Race: White Female   History of Present Illness [name]; 09/26/2015 6:19 PM) Patient words: po lumpectomy.  The patient is a 75 year old female.  Note:Procedure: Left partial mastectomy after radioactive seed localization. Left axillary sentinel lymph node biopsy.  Date: Sep 09, 2015  Pathology: pT1b, pN0, pMX. ER/PR positive, her 2 negative, Oncotype score of 29  History: She presents for her first postoperative visit today. She is due to see Dr. [name] later today. She is doing well as has no complaints.  Exam: General- Is in NAD. Breasts-left breast and axillary incisions are clean and intact with minimal swelling.  Problem List/Past Medical [name], [place]; 09/26/2015 1:54 PM) MALIGNANT NEOPLASM OF UPPER-OUTER QUADRANT OF LEFT FEMALE BREAST (C50.412)  Other Problems [name], [place]; 09/26/2015 1:54 PM) Breast Cancer Gastric Ulcer Gastroesophageal Reflux Disease Heart murmur High blood pressure Hypercholesterolemia Lump In Breast Migraine Headache Thyroid Disease  Diagnostic Studies History [name], [place]; 09/26/2015 1:54 PM) Mammogram within last year  Allergies [name], CMA; 09/26/2015 1:54 PM) Nitrofurantoin *URINARY ANTI-INFECTIVES* Sulfa 10 *OPHTHALMIC AGENTS*  Medication History [name], CMA; 09/26/2015 1:54 PM) Venlafaxine HCl ER (150MG  Capsule ER 24HR, Oral daily) Active. Atenolol (50MG  Tablet, Oral) Active. Synthroid (50MCG Tablet, Oral) Active. HydroCHLOROthiazide (12.5MG  Capsule, Oral) Active. Vitamin D (1000UNIT Tablet, Oral) Active. Omega 3 (1200MG  Capsule, Oral) Active. Turmeric (450MG  Capsule, Oral) Active. Centrum Silver (Oral) Active. Zegerid (20-1100MG  Capsule, Oral half a tablet as needed) Active. Green Tea  Active. Medications Reconciled  Social History [name], [place]; 09/26/2015 1:54 PM) Alcohol use Occasional alcohol use. No caffeine use No drug use Tobacco use Never smoker.  Family History [name], [place]; 09/26/2015 1:54 PM) Cerebrovascular Accident Father. Colon Cancer Father. Heart Disease Father. Heart disease in female family member before age 62 Migraine Headache Family Members In General, Father. Rectal Cancer Father.  Pregnancy / Birth History [name], [place]; 09/26/2015 1:54 PM) Age at menarche 55 years. Age of menopause 35-50 Gravida 0 Para 0  Vitals (Kearney Brooks CMA; 09/26/2015 1:54 PM) 09/26/2015 1:54 PM Weight: 148.25 lb Height: 61in Body Surface Area: 1.66 m Body Mass Index: 28.01 kg/m  BP: 120/76 (Sitting, Left Arm, Standard)       Assessment & Plan [name]; 09/26/2015 6:19 PM) MALIGNANT NEOPLASM OF UPPER-OUTER QUADRANT OF LEFT FEMALE BREAST (C50.412) Impression: Left breast and axillary wounds are healing well. We discussed the pathology. She is due to see Dr. [name] later today. Oncotype score is elevated.  We discussed this and talked about putting in a Port-a-cath.  Plan:  US guided Port-a-cath insertion.  The procedure risks and aftercare been explained. Risks include but are not limited to bleeding, infection, malfunction, pneumothorax, wound problems, DVT.  Plan: Activities as tolerated. Return visit one month.

## 2015-10-03 ENCOUNTER — Other Ambulatory Visit: Payer: Medicare Other

## 2015-10-03 ENCOUNTER — Encounter (HOSPITAL_COMMUNITY): Payer: Self-pay | Admitting: *Deleted

## 2015-10-03 ENCOUNTER — Encounter: Payer: Self-pay | Admitting: Family Medicine

## 2015-10-03 ENCOUNTER — Encounter: Payer: Self-pay | Admitting: *Deleted

## 2015-10-03 MED ORDER — CEFAZOLIN SODIUM-DEXTROSE 2-4 GM/100ML-% IV SOLN
2.0000 g | INTRAVENOUS | Status: AC
  Administered 2015-10-04: 2 g via INTRAVENOUS
  Filled 2015-10-03: qty 100

## 2015-10-03 NOTE — Progress Notes (Signed)
Pt denies SOB, chest pain, and being under the care of a cardiologist. Pt denies having a cardiac cath but stated that a stress test was done > 10 years ago. Pt made aware to stop taking Aspirin, vitamins, fish oil and herbal medications such as Tumeric. Do not take any NSAIDs ie: Ibuprofen, Advil, Naproxen, BC and Goody Powder or any medication containing Aspirin. Pt verbalized understanding of all pre-op instructions.

## 2015-10-04 ENCOUNTER — Other Ambulatory Visit: Payer: Self-pay | Admitting: Oncology

## 2015-10-04 ENCOUNTER — Ambulatory Visit (HOSPITAL_COMMUNITY): Payer: Medicare Other | Admitting: Anesthesiology

## 2015-10-04 ENCOUNTER — Encounter (HOSPITAL_COMMUNITY)
Admission: RE | Disposition: A | Discharge status: Home / Self Care | Payer: Self-pay | Source: Ambulatory Visit | Attending: General Surgery

## 2015-10-04 ENCOUNTER — Ambulatory Visit (HOSPITAL_COMMUNITY)
Admission: RE | Admit: 2015-10-04 | Discharge: 2015-10-04 | Disposition: A | Discharge status: Home / Self Care | Payer: Medicare Other | Source: Ambulatory Visit | Attending: General Surgery | Admitting: General Surgery

## 2015-10-04 ENCOUNTER — Ambulatory Visit (HOSPITAL_COMMUNITY): Payer: Medicare Other

## 2015-10-04 DIAGNOSIS — Z95828 Presence of other vascular implants and grafts: Secondary | ICD-10-CM

## 2015-10-04 DIAGNOSIS — M1990 Unspecified osteoarthritis, unspecified site: Secondary | ICD-10-CM | POA: Diagnosis not present

## 2015-10-04 DIAGNOSIS — Z170 Estrogen receptor positive status [ER+]: Secondary | ICD-10-CM | POA: Insufficient documentation

## 2015-10-04 DIAGNOSIS — C50412 Malignant neoplasm of upper-outer quadrant of left female breast: Secondary | ICD-10-CM | POA: Diagnosis not present

## 2015-10-04 DIAGNOSIS — E785 Hyperlipidemia, unspecified: Secondary | ICD-10-CM | POA: Diagnosis not present

## 2015-10-04 DIAGNOSIS — C50919 Malignant neoplasm of unspecified site of unspecified female breast: Secondary | ICD-10-CM

## 2015-10-04 DIAGNOSIS — I10 Essential (primary) hypertension: Secondary | ICD-10-CM | POA: Insufficient documentation

## 2015-10-04 DIAGNOSIS — E039 Hypothyroidism, unspecified: Secondary | ICD-10-CM | POA: Insufficient documentation

## 2015-10-04 DIAGNOSIS — K219 Gastro-esophageal reflux disease without esophagitis: Secondary | ICD-10-CM | POA: Insufficient documentation

## 2015-10-04 DIAGNOSIS — G4730 Sleep apnea, unspecified: Secondary | ICD-10-CM | POA: Insufficient documentation

## 2015-10-04 DIAGNOSIS — D649 Anemia, unspecified: Secondary | ICD-10-CM | POA: Diagnosis not present

## 2015-10-04 DIAGNOSIS — Z803 Family history of malignant neoplasm of breast: Secondary | ICD-10-CM | POA: Diagnosis not present

## 2015-10-04 DIAGNOSIS — C50912 Malignant neoplasm of unspecified site of left female breast: Secondary | ICD-10-CM | POA: Diagnosis not present

## 2015-10-04 DIAGNOSIS — Z452 Encounter for adjustment and management of vascular access device: Secondary | ICD-10-CM | POA: Diagnosis not present

## 2015-10-04 HISTORY — DX: Cardiac murmur, unspecified: R01.1

## 2015-10-04 HISTORY — DX: Sleep apnea, unspecified: G47.30

## 2015-10-04 HISTORY — PX: PORTACATH PLACEMENT: SHX2246

## 2015-10-04 LAB — CBC
HCT: 40 % (ref 36.0–46.0)
Hemoglobin: 13 g/dL (ref 12.0–15.0)
MCH: 28.3 pg (ref 26.0–34.0)
MCHC: 32.5 g/dL (ref 30.0–36.0)
MCV: 87 fL (ref 78.0–100.0)
Platelets: 224 10*3/uL (ref 150–400)
RBC: 4.6 MIL/uL (ref 3.87–5.11)
RDW: 13.2 % (ref 11.5–15.5)
WBC: 6.8 10*3/uL (ref 4.0–10.5)

## 2015-10-04 LAB — BASIC METABOLIC PANEL
Anion gap: 6 (ref 5–15)
BUN: 17 mg/dL (ref 6–20)
CO2: 24 mmol/L (ref 22–32)
Calcium: 9.1 mg/dL (ref 8.9–10.3)
Chloride: 109 mmol/L (ref 101–111)
Creatinine, Ser: 0.77 mg/dL (ref 0.44–1.00)
GFR calc Af Amer: >60 mL/min (ref >60)
GFR calc non Af Amer: >60 mL/min (ref >60)
Glucose, Bld: 104 mg/dL — ABNORMAL HIGH (ref 65–99)
Potassium: 4.1 mmol/L (ref 3.5–5.1)
Sodium: 139 mmol/L (ref 135–145)

## 2015-10-04 SURGERY — INSERTION, TUNNELED CENTRAL VENOUS DEVICE, WITH PORT
Anesthesia: General | Site: Neck

## 2015-10-04 MED ORDER — FENTANYL CITRATE (PF) 250 MCG/5ML IJ SOLN
INTRAMUSCULAR | Status: AC
  Filled 2015-10-04: qty 5

## 2015-10-04 MED ORDER — HEPARIN SOD (PORK) LOCK FLUSH 100 UNIT/ML IV SOLN
INTRAVENOUS | Status: DC | PRN
  Administered 2015-10-04: 250 [IU] via INTRAVENOUS

## 2015-10-04 MED ORDER — LIDOCAINE HCL (PF) 1 % IJ SOLN
INTRAMUSCULAR | Status: DC | PRN
  Administered 2015-10-04: 9 mL

## 2015-10-04 MED ORDER — CHLORHEXIDINE GLUCONATE CLOTH 2 % EX PADS: 6.0000 | MEDICATED_PAD | Freq: Once | CUTANEOUS | Status: DC

## 2015-10-04 MED ORDER — FENTANYL CITRATE (PF) 100 MCG/2ML IJ SOLN
INTRAMUSCULAR | Status: DC | PRN
  Administered 2015-10-04 (×2): 25 ug via INTRAVENOUS
  Administered 2015-10-04: 50 ug via INTRAVENOUS

## 2015-10-04 MED ORDER — SODIUM CHLORIDE 0.9 % IV SOLN
INTRAVENOUS | Status: DC | PRN
  Administered 2015-10-04: 12:00:00

## 2015-10-04 MED ORDER — HYDROCODONE-ACETAMINOPHEN 7.5-325 MG PO TABS: 1.0000 | ORAL_TABLET | Freq: Once | ORAL | Status: DC | PRN

## 2015-10-04 MED ORDER — DEXAMETHASONE SODIUM PHOSPHATE 10 MG/ML IJ SOLN
INTRAMUSCULAR | Status: AC
Start: 2015-10-04 — End: 2015-10-04
  Filled 2015-10-04: qty 1

## 2015-10-04 MED ORDER — PROPOFOL 10 MG/ML IV BOLUS
INTRAVENOUS | Status: AC
  Filled 2015-10-04: qty 20

## 2015-10-04 MED ORDER — ONDANSETRON HCL 4 MG/2ML IJ SOLN
INTRAMUSCULAR | Status: DC | PRN
  Administered 2015-10-04: 4 mg via INTRAVENOUS

## 2015-10-04 MED ORDER — LIDOCAINE 2% (20 MG/ML) 5 ML SYRINGE
INTRAMUSCULAR | Status: AC
  Filled 2015-10-04: qty 5

## 2015-10-04 MED ORDER — LIDOCAINE HCL (PF) 2 % IJ SOLN
INTRAMUSCULAR | Status: DC | PRN
  Administered 2015-10-04: 75 mg via INTRADERMAL

## 2015-10-04 MED ORDER — PROPOFOL 10 MG/ML IV BOLUS
INTRAVENOUS | Status: DC | PRN
  Administered 2015-10-04: 170 mg via INTRAVENOUS
  Administered 2015-10-04: 10 mg via INTRAVENOUS

## 2015-10-04 MED ORDER — DEXAMETHASONE SODIUM PHOSPHATE 10 MG/ML IJ SOLN
INTRAMUSCULAR | Status: DC | PRN
  Administered 2015-10-04: 10 mg via INTRAVENOUS

## 2015-10-04 MED ORDER — LACTATED RINGERS IV SOLN
INTRAVENOUS | Status: DC
  Administered 2015-10-04: 10:00:00 via INTRAVENOUS

## 2015-10-04 MED ORDER — HYDROMORPHONE HCL 1 MG/ML IJ SOLN: 0.2500 mg | INTRAMUSCULAR | Status: DC | PRN

## 2015-10-04 SURGICAL SUPPLY — 51 items
APL SKNCLS STERI-STRIP NONHPOA (GAUZE/BANDAGES/DRESSINGS) ×1
BAG DECANTER FOR FLEXI CONT (MISCELLANEOUS) ×3 IMPLANT
BENZOIN TINCTURE PRP APPL 2/3 (GAUZE/BANDAGES/DRESSINGS) ×3 IMPLANT
CHLORAPREP W/TINT 26ML (MISCELLANEOUS) ×3 IMPLANT
CLOSURE WOUND 1/4X4 (GAUZE/BANDAGES/DRESSINGS) ×1
COVER SURGICAL LIGHT HANDLE (MISCELLANEOUS) ×3 IMPLANT
COVER TRANSDUCER ULTRASND GEL (DRAPE) ×3 IMPLANT
CRADLE DONUT ADULT HEAD (MISCELLANEOUS) ×3 IMPLANT
DECANTER SPIKE VIAL GLASS SM (MISCELLANEOUS) ×3 IMPLANT
DRAPE C-ARM 42X72 X-RAY (DRAPES) ×3 IMPLANT
DRAPE LAPAROSCOPIC ABDOMINAL (DRAPES) ×3 IMPLANT
DRAPE UTILITY XL STRL (DRAPES) ×6 IMPLANT
DRSG TEGADERM 2-3/8X2-3/4 SM (GAUZE/BANDAGES/DRESSINGS) ×2 IMPLANT
DRSG TEGADERM 4X4.75 (GAUZE/BANDAGES/DRESSINGS) ×2 IMPLANT
ELECT CAUTERY BLADE 6.4 (BLADE) ×3 IMPLANT
ELECT REM PT RETURN 9FT ADLT (ELECTROSURGICAL) ×3
ELECTRODE REM PT RTRN 9FT ADLT (ELECTROSURGICAL) ×1 IMPLANT
GAUZE SPONGE 2X2 8PLY STRL LF (GAUZE/BANDAGES/DRESSINGS) ×1 IMPLANT
GAUZE SPONGE 4X4 16PLY XRAY LF (GAUZE/BANDAGES/DRESSINGS) ×3 IMPLANT
GEL ULTRASOUND 20GR AQUASONIC (MISCELLANEOUS) ×3 IMPLANT
GLOVE BIOGEL PI IND STRL 8 (GLOVE) ×1 IMPLANT
GLOVE BIOGEL PI INDICATOR 8 (GLOVE) ×2
GLOVE ECLIPSE 8.0 STRL XLNG CF (GLOVE) ×3 IMPLANT
GOWN STRL REUS W/ TWL LRG LVL3 (GOWN DISPOSABLE) ×2 IMPLANT
GOWN STRL REUS W/TWL LRG LVL3 (GOWN DISPOSABLE) ×6
INTRODUCER 13FR (MISCELLANEOUS) IMPLANT
INTRODUCER COOK 11FR (CATHETERS) IMPLANT
KIT BASIN OR (CUSTOM PROCEDURE TRAY) ×3 IMPLANT
KIT PORT POWER 8FR ISP CVUE (Catheter) ×2 IMPLANT
KIT ROOM TURNOVER OR (KITS) ×3 IMPLANT
NDL HYPO 25GX1X1/2 BEV (NEEDLE) ×1 IMPLANT
NEEDLE 22X1 1/2 (OR ONLY) (NEEDLE) ×3 IMPLANT
NEEDLE HYPO 25GX1X1/2 BEV (NEEDLE) ×3 IMPLANT
NS IRRIG 1000ML POUR BTL (IV SOLUTION) ×3 IMPLANT
PACK SURGICAL SETUP 50X90 (CUSTOM PROCEDURE TRAY) ×3 IMPLANT
PAD ARMBOARD 7.5X6 YLW CONV (MISCELLANEOUS) ×6 IMPLANT
PENCIL BUTTON HOLSTER BLD 10FT (ELECTRODE) ×3 IMPLANT
SET INTRODUCER 12FR PACEMAKER (SHEATH) IMPLANT
SET SHEATH INTRODUCER 10FR (MISCELLANEOUS) IMPLANT
SHEATH COOK PEEL AWAY SET 9F (SHEATH) IMPLANT
SPONGE GAUZE 2X2 STER 10/PKG (GAUZE/BANDAGES/DRESSINGS) ×2
STRIP CLOSURE SKIN 1/4X4 (GAUZE/BANDAGES/DRESSINGS) ×2 IMPLANT
SUT MON AB 4-0 PC3 18 (SUTURE) ×3 IMPLANT
SUT VIC AB 2-0 SH 18 (SUTURE) ×3 IMPLANT
SUT VIC AB 3-0 SH 27 (SUTURE)
SUT VIC AB 3-0 SH 27X BRD (SUTURE) IMPLANT
SYR 20ML ECCENTRIC (SYRINGE) ×6 IMPLANT
SYR 5ML LUER SLIP (SYRINGE) ×3 IMPLANT
SYR CONTROL 10ML LL (SYRINGE) ×3 IMPLANT
TOWEL OR 17X24 6PK STRL BLUE (TOWEL DISPOSABLE) ×3 IMPLANT
TOWEL OR 17X26 10 PK STRL BLUE (TOWEL DISPOSABLE) ×3 IMPLANT

## 2015-10-04 NOTE — Op Note (Signed)
OPERATIVE NOTE-PORT-A-CATH INSERTION  Preoperative diagnosis:  Breast cancer   Postoperative diagnosis:  Saem  Procedure: Ultrasound-guided Port-A-Cath insertion into right internal jugular vein under fluoroscopy.  Surgeon: Jackolyn Confer M.D.  Anesthesia: General/LMA with Local (Xylocaine) .  Indication:  This is a 75 year old female with breast cancer who needs long-term venous access for chemotherapy.  Technique: She was brought to the operating room, placed supine on the operating table, and intravenous sedation was given. A roll was placed under the back and the arms were tucked.The upper chest wall and neck were sterilely prepped and draped. A timeout was performed.  Her head was rotated to the right. Using the ultrasound, the right internal jugular vein was identified. Local anesthetic was infiltrated in the skin and subcutaneous tissue just anterior to it. A 16-gauge needle was used to cannulate the right internal jugular vein under ultrasound guidance. A wire was then threaded through the needle into the internal jugular vein and down into the right heart under ultrasound and fluoroscopic guidance.  Local anesthetic was infiltrated into the right upper chest wall. A right upper chest wall incision was made and a pocket was created for the Portacath.  An incision was made around the wire in the neck. The catheter was then tunneled from the chest wall incision up through the neck incision.  A dilator- introducer complex was placed over the wire into the superior vena cava. The dilator and wire were then removed and the catheter was threaded through the peel-away sheath introducer into the right heart. The introducer was then peeled away and removed. Under fluoroscopic guidance, the tip of the catheter was then pulled back until it was at the mid to distal SVC. The catheter was then connected to the port.  The port aspirated blood and flushed easily.  The port was then anchored to the  chest wall with 2-0 Prolene suture. Concentrated heparin solution was then placed into the port. The port and catheter position were then verified using fluoroscopy. The subcutaneous tissue was then closed over the port with running 3-0 Vicryl suture. The skin incisions were then closed with 4-0 Monocryl subcuticular stitches. Steri-Strips and sterile dressings were applied.  The procedure was well-tolerated without any apparent complications. She was taken to the recovery room in satisfactory condition where a portable chest x-ray is pending.

## 2015-10-04 NOTE — Interval H&P Note (Signed)
History and Physical Interval Note:  10/04/2015 10:47 AM  [name]  has presented today for surgery, with the diagnosis of breast cancer  The various methods of treatment have been discussed with the patient and family. After consideration of risks, benefits and other options for treatment, the patient has consented to  Procedure(s): INSERTION PORT-A-CATH WITH [place] (N/A) as a surgical intervention .  The patient's history has been reviewed, patient examined, no change in status, stable for surgery.  I have reviewed the patient's chart and labs.  Questions were answered to the patient's satisfaction.     Jony Ladnier [name]

## 2015-10-04 NOTE — Anesthesia Postprocedure Evaluation (Signed)
Anesthesia Post Note  Patient: [name]  Procedure(s) Performed: Procedure(s) (LRB): INSERTION PORT-A-CATH WITH ULTRASOUND (N/A)  Patient location during evaluation: PACU Anesthesia Type: General Level of consciousness: awake and alert Pain management: pain level controlled Vital Signs Assessment: post-procedure vital signs reviewed and stable Respiratory status: spontaneous breathing, nonlabored ventilation, respiratory function stable and patient connected to nasal cannula oxygen Cardiovascular status: blood pressure returned to baseline and stable Postop Assessment: no signs of nausea or vomiting Anesthetic complications: no    Last Vitals:  Filed Vitals:   10/04/15 1249 10/04/15 1253  BP:  147/79  Pulse: 63 61  Temp: 36.1 C   Resp: 20 20    Last Pain:  Filed Vitals:   10/04/15 1302  PainSc: 0-No pain                 Effie Berkshire

## 2015-10-04 NOTE — Discharge Instructions (Signed)
° ° °  PORT-A-CATH: POST OP INSTRUCTIONS  Always review your discharge instruction sheet given to you by the facility where your surgery was performed.   1. A prescription for pain medication may be given to you upon discharge. Take your pain medication as prescribed, if needed. If narcotic pain medicine is not needed, then you make take acetaminophen (Tylenol) or ibuprofen (Advil) as needed.  2. Take your usually prescribed medications unless otherwise directed. 3. If you need a refill on your pain medication, please contact our office. All narcotic pain medicine now requires a paper prescription.  Phoned in and fax refills are no longer allowed by law.  Prescriptions will not be filled after 5 pm or on weekends.  4. You should follow a light diet for the remainder of the day after your procedure. 5. Most patients will experience some mild swelling and/or bruising in the area of the incision. It may take several days to resolve. 6. It is common to experience some constipation if taking pain medication after surgery. Increasing fluid intake and taking a stool softener (such as Colace) will usually help or prevent this problem from occurring. A mild laxative (Milk of Magnesia or Miralax) should be taken according to package directions if there are no bowel movements after 48 hours.  7. Unless discharge instructions indicate otherwise, you may remove your bandages 72 hours after surgery. You may shower the day after surgery. . You may have steri-strips (small white skin tapes) in place directly over the incision.  These strips should be left on the skin.  If your surgeon used Dermabond (skin glue) on the incision, you may shower in 24 hours.  The glue will flake off over the next 2-3 weeks.  8. If your port is left accessed at the end of surgery (needle left in port), the dressing cannot get wet and should only by changed by a healthcare professional. When the port is no longer accessed (when the needle has  been removed), follow step 7.   9. ACTIVITIES:  Limit activity involving your right arm for the next week. Do no strenuous exercise or activity for 1 week. You may drive when you are no longer taking prescription pain medication, you can comfortably wear a seatbelt, and you can maneuver your car. 10.You may need to see your doctor in the office for a follow-up appointment.  Please       check with your doctor.  11.When you receive a new Port-a-Cath, you will get a product guide and        ID card.  Please keep them in case you need them.  WHEN TO CALL YOUR DOCTOR [phone]): 1. Fever over 101.0 2. Chills 3. Continued bleeding from incision 4. Increased redness and tenderness at the site 5. Shortness of breath, difficulty breathing   The clinic staff is available to answer your questions during regular business hours. Please dont hesitate to call and ask to speak to one of the nurses or medical assistants for clinical concerns. If you have a medical emergency, go to the nearest emergency room or call 911.  A surgeon from [facility] is always on call at the hospital.     For further information, please visit [email or web address]

## 2015-10-04 NOTE — Anesthesia Preprocedure Evaluation (Addendum)
Anesthesia Evaluation  Patient identified by MRN, date of birth, ID band Patient awake    Reviewed: Allergy & Precautions, NPO status , Patient's Chart, lab work & pertinent test results  Airway Mallampati: II       Dental  (+) Teeth Intact, Dental Advisory Given   Pulmonary sleep apnea ,    breath sounds clear to auscultation       Cardiovascular hypertension, Pt. on medications  Rhythm:Regular Rate:Normal     Neuro/Psych  Headaches, PSYCHIATRIC DISORDERS Anxiety    GI/Hepatic Neg liver ROS, GERD  Medicated,  Endo/Other  Hypothyroidism   Renal/GU Renal disease  negative genitourinary   Musculoskeletal  (+) Arthritis ,   Abdominal   Peds negative pediatric ROS (+)  Hematology negative hematology ROS (+)   Anesthesia Other Findings - HLD  Reproductive/Obstetrics negative OB ROS                            Lab Results  Component Value Date   WBC 8.4 09/07/2015   HGB 14.7 09/07/2015   HCT 44.6 09/07/2015   MCV 85.7 09/07/2015   PLT 260 09/07/2015   Lab Results  Component Value Date   CREATININE 1.3* 09/07/2015   BUN 26.5* 09/07/2015   NA 143 09/07/2015   K 4.1 09/07/2015   CL 103 09/05/2015   CO2 29 09/07/2015   No results found for: INR, PROTIME  08/2015 EKG: normal sinus rhythm.   Anesthesia Physical Anesthesia Plan  ASA: III  Anesthesia Plan: General   Post-op Pain Management:    Induction: Intravenous  Airway Management Planned: LMA  Additional Equipment:   Intra-op Plan:   Post-operative Plan: Extubation in OR  Informed Consent: I have reviewed the patients History and Physical, chart, labs and discussed the procedure including the risks, benefits and alternatives for the proposed anesthesia with the patient or authorized representative who has indicated his/her understanding and acceptance.   Dental advisory given  Plan Discussed with: CRNA  Anesthesia  Plan Comments:         Anesthesia Quick Evaluation

## 2015-10-04 NOTE — Anesthesia Procedure Notes (Signed)
Procedure Name: LMA Insertion Date/Time: 10/04/2015 11:24 AM Performed by: Lollie Sails Pre-anesthesia Checklist: Patient identified, Emergency Drugs available, Suction available, Patient being monitored and Timeout performed Patient Re-evaluated:Patient Re-evaluated prior to inductionOxygen Delivery Method: Circle system utilized Preoxygenation: Pre-oxygenation with 100% oxygen Intubation Type: IV induction Ventilation: Mask ventilation without difficulty LMA: LMA inserted LMA Size: 4.0 Number of attempts: 1 Placement Confirmation: positive ETCO2 and breath sounds checked- equal and bilateral Tube secured with: Tape Dental Injury: Teeth and Oropharynx as per pre-operative assessment

## 2015-10-04 NOTE — Transfer of Care (Signed)
Immediate Anesthesia Transfer of Care Note  Patient: [name]  Procedure(s) Performed: Procedure(s): INSERTION PORT-A-CATH WITH ULTRASOUND (N/A)  Patient Location: PACU  Anesthesia Type:General  Level of Consciousness: awake, alert  and patient cooperative  Airway & Oxygen Therapy: Patient Spontanous Breathing and Patient connected to nasal cannula oxygen  Post-op Assessment: Report given to RN and Post -op Vital signs reviewed and stable  Post vital signs: Reviewed and stable  Last Vitals:  Filed Vitals:   10/04/15 0929  BP: 149/53  Pulse: 58  Temp: 36.6 C  Resp: 20    Last Pain: There were no vitals filed for this visit.    Patients Stated Pain Goal: 3 ([id number])  Complications: No apparent anesthesia complications

## 2015-10-04 NOTE — H&P (View-Only) (Signed)
[name] 09/26/2015 1:54 PM Location: [facility] Patient #: [id number] DOB: [birth year] Married / Language: [name] / Race: White Female   History of Present Illness [name]; 09/26/2015 6:19 PM) Patient words: po lumpectomy.  The patient is a 75 year old female.  Note:Procedure: Left partial mastectomy after radioactive seed localization. Left axillary sentinel lymph node biopsy.  Date: Sep 09, 2015  Pathology: pT1b, pN0, pMX. ER/PR positive, her 2 negative, Oncotype score of 29  History: She presents for her first postoperative visit today. She is due to see Dr. [name] later today. She is doing well as has no complaints.  Exam: General- Is in NAD. Breasts-left breast and axillary incisions are clean and intact with minimal swelling.  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 Atenolol (50MG  Tablet, Oral) Active. Synthroid (50MCG Tablet, Oral) Active. HydroCHLOROthiazide (12.5MG  Capsule, Oral) Active. Vitamin D (1000UNIT Tablet, Oral) Active. Omega 3 (1200MG  Capsule, Oral) Active. Turmeric (450MG  Capsule, Oral) Active. Centrum Silver (Oral) Active. Zegerid (20-1100MG  Capsule, Oral half a tablet as needed) Active. Green Tea  Active. Medications Reconciled  Social History [name], [place]; 09/26/2015 1:54 PM) Alcohol use Occasional alcohol use. No caffeine use No drug use Tobacco use Never smoker.  Family History [name], [place]; 09/26/2015 1:54 PM) Cerebrovascular Accident Father. Colon Cancer Father. Heart Disease Father. Heart disease in female family member before age 58 Migraine Headache Family Members In General, Father. Rectal Cancer Father.  Pregnancy / Birth History [name], [place]; 09/26/2015 1:54 PM) Age at menarche 85 years. 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 We discussed the pathology. She is due to see Dr. [name] later today. Oncotype score is elevated.  We discussed this and talked about putting in a Port-a-cath.  Plan:  US guided Port-a-cath insertion.  The procedure risks and aftercare been explained. Risks include but are not limited to bleeding, infection, malfunction, pneumothorax, wound problems, DVT.  Plan: Activities as tolerated. Return visit one month.

## 2015-10-05 ENCOUNTER — Encounter: Payer: Self-pay | Admitting: Oncology

## 2015-10-05 ENCOUNTER — Encounter (HOSPITAL_COMMUNITY): Payer: Self-pay | Admitting: General Surgery

## 2015-10-05 NOTE — Progress Notes (Signed)
Spoke w/ pt to discuss copay assistance.  She wanted me to speak to her husband about this so I talked to him about the Patient [place] which pt is approved for $5000 to cover drugs associated w/ her Dx for 12 months from 10/05/15 w/ a 6 month look back period. [id number] is guaranteed, $3350 is accessible on a first-come first-served basis as long as funding is available. I emailed copy of approval letter & POE to [place] in billing. Informed pt of the [place] went over what it will cover.  They didn't say whether or not she will apply for the grant.  I will see pt at her next visit to give her the PAF approval letter as well as my contact info for any questions or concerns she may have in the future.

## 2015-10-05 NOTE — Progress Notes (Signed)
Left msg for pt to return my call to discuss copay assistance w/ the Patient [place] & to discuss the [employer].

## 2015-10-11 ENCOUNTER — Telehealth: Payer: Self-pay | Admitting: Oncology

## 2015-10-11 NOTE — Telephone Encounter (Signed)
Patient husband came into the office 6/27 to r/s upcoming appt dates/times so the injection appt will not fall on Saturdays. Pt has a son that has needs that will prevent them from coming to the clinic on Saturday's.

## 2015-10-20 ENCOUNTER — Other Ambulatory Visit: Payer: Medicare Other

## 2015-10-20 ENCOUNTER — Ambulatory Visit: Payer: Medicare Other

## 2015-10-20 ENCOUNTER — Ambulatory Visit: Payer: Medicare Other | Admitting: Oncology

## 2015-10-21 ENCOUNTER — Other Ambulatory Visit: Payer: Self-pay | Admitting: *Deleted

## 2015-10-21 DIAGNOSIS — C50412 Malignant neoplasm of upper-outer quadrant of left female breast: Secondary | ICD-10-CM

## 2015-10-22 ENCOUNTER — Ambulatory Visit: Payer: Medicare Other

## 2015-10-23 NOTE — Progress Notes (Signed)
[place]  [phone]) [phone] Fax:(336) [phone]     ID: [name] DOB: [birth year]  MR#: [id number]  OJJ#:[id number]  Patient Care Team: [name], MD as PCP - General Chauncey Cruel, MD as Consulting Physician (Oncology) Jackolyn Confer, MD as Consulting Physician (General Surgery) Eppie Gibson, MD as Attending Physician (Radiation Oncology) Richmond Campbell, MD as Consulting Physician (Gastroenterology) Lavonna Monarch, MD as Consulting Physician (Dermatology) PCP: [name], MD OTHER MD:  CHIEF COMPLAINT: Estrogen receptor positive breast cancer  CURRENT TREATMENT: Adjuvant chemotherapy with cyclophosphamide and docetaxel   BREAST CANCER HISTORY: From the original intake note:  [name] had bilateral screening mammography with tomography scheduled routinely 08/01/2015 at the [facility]. This showed a possible mass in the left breast. Accordingly on 08/15/2015 she underwent left diagnostic mammography with tomography and ultrasonography. This found the breast density to be category be. On spot compression views and was a 0.9 cm spiculated mass in the upper outer left breast, which on ultrasound was at 2:30 o'clock 5 cm from the nipple and measured 0.8 cm. Ultrasound of the axilla was benign.  Biopsy of the left breast mass in question 08/19/2015 found ([id number] an invasive ductal carcinoma (E-cadherin positive) which was estrogen receptor 100% positive, progesterone receptor 100% positive, both with strong staining intensity, with an MIB-1 of 10%, and no HER-2 amplification, the signals ratio being 1.23 and the number per cell 1.85.  Her subsequent history is as detailed below  INTERVAL HISTORY: [name] returns today for follow-up of her early stage breast cancer accompanied by her husband [name]. Today is day 1 cycle 1 of 4 planned cycles of cyclophosphamide and docetaxel given 21 days apart.   Normally we would have met before today to  discuss her supportive metastases but she was out of town last week so we did all that today. She still did correctly take her dexamethasone yesterday as indicated on the medication bottle  Since her last visit here she had her port placed. There have been no problems with that. She didn't put the numbing cream on it but forgot to cover it up with plastic wrapf. We got that down for her at the time of the visit.   REVIEW OF SYSTEMS: She had no complications from the port placement. She tolerated the dexamethasone will yesterday except she only slept 3 hours last night. She is worried because she gets horrible headaches for which she takes of nonsteroidals and she was told that she couldn't take nonsteroidals. We corrected that. She has a little bit of a runny nose. She has a history of vertigo and wondered if chemotherapy might make that better or worse. It should not do either. Aside from these issues a detailed review of systems today was noncontributory   PAST MEDICAL HISTORY: Past Medical History  Diagnosis Date  . Anemia   . GERD (gastroesophageal reflux disease)   . Headache(784.0)   . Ulcer   . Kidney stones   . Rheumatic fever     age 30  . UTI (lower urinary tract infection)   . Osteopenia   . Hyperlipidemia   . Thyroid disease     Hypothyroid  . Osteoporosis   . Atrophic vaginitis   . Hypertension   . Endometriosis   . Hypothyroidism   . Anxiety   . Arthritis   . Cancer [facility])     breast cancer  . Family history of breast cancer   . Family history of colon cancer   .  Heart murmur     as a child only  . Sleep apnea     " very mild" does not wear CPAP    PAST SURGICAL HISTORY: Past Surgical History  Procedure Laterality Date  . Laparoscopic endometriosis fulguration  1978  . Ulnar nerve repair  2010  . Tonsillectomy    . Pelvic laparoscopy    . Breast lumpectomy with radioactive seed and sentinel lymph node biopsy Left 09/09/2015    Procedure: LEFT BREAST  LUMPECTOMY WITH RADIOACTIVE SEED AND SENTINEL LYMPH NODE BIOPSY;  Surgeon: Jackolyn Confer, MD;  Location: [facility];  Service: General;  Laterality: Left;  . Portacath placement N/A 10/04/2015    Procedure: INSERTION PORT-A-CATH WITH ULTRASOUND;  Surgeon: Jackolyn Confer, MD;  Location: [facility];  Service: General;  Laterality: N/A;    FAMILY HISTORY Family History  Problem Relation Age of Onset  . Breast cancer Mother     Age 63  . Hypertension Mother   . Heart disease Father   . Stroke Father   . Colon cancer Father     dx 44s  . Breast cancer Paternal Grandmother     Age 25  . Diabetes Paternal Grandmother   . Heart disease Maternal Grandmother   The patient's father died at age 46 the patient's mother was diagnosed with breast cancer at age 75 and again at age 52, which was the year of her death. In addition a paternal grandmother was diagnosed with breast cancer in her 78s. The patient's father had colon cancer. The patient had 2 brothers, no sisters. There is no history of ovarian cancer in the family.  GYNECOLOGIC HISTORY:  No LMP recorded. Patient is postmenopausal. Menarche age 26. The patient is GX P0. She went through menopause in her late 7s. She did not take hormone replacement. She never used oral contraceptives.  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 Her husband [name]") is retired from [employer]. They have an adopted son, [name], who is completing a second Engineer, production degree at [employer] state area he carries a diagnosis of schizophrenia, but is obviously very functional. The patient has no grandchildren. She is a Chief of Staff    ADVANCED DIRECTIVES:  NOT IN PLACE   HEALTH MAINTENANCE: Social History  Substance Use Topics  . Smoking status: Never Smoker   . Smokeless tobacco: Never Used  . Alcohol Use: No     Colonoscopy: 2012/[facility]   PAP: 2013?   Bone density: 2012/osteopenia   Lipid panel:  Allergies  Allergen Reactions   . Nitrofurantoin Nausea And Vomiting    GI upset  . Sulfamethoxazole-Trimethoprim Nausea And Vomiting and Other (See Comments)    GI upset    Current Outpatient Prescriptions  Medication Sig Dispense Refill  . acetaminophen (TYLENOL) 500 MG tablet Take 1,000 mg by mouth every 6 (six) hours as needed (For pain.).    Marland Kitchen atenolol (TENORMIN) 50 MG tablet TAKE 1 TABLET (50 MG TOTAL) BY MOUTH DAILY. 30 tablet 5  . chlorhexidine (PERIDEX) 0.12 % solution Use as directed 15 mLs in the mouth or throat 2 (two) times daily.     . Cholecalciferol (VITAMIN D) 2000 UNITS CAPS Take 2,000 Units by mouth daily.     Marland Kitchen dexamethasone (DECADRON) 4 MG tablet Take 2 tablets (8 mg total) by mouth 2 (two) times daily. Start the day before Taxotere. Then again the day after chemo for 3 days. 30 tablet 1  . fish oil-omega-3 fatty acids 1000 MG capsule  Take 2 g by mouth daily.      . hydrochlorothiazide (HYDRODIURIL) 25 MG tablet TAKE 1 TABLET (25 MG TOTAL) BY MOUTH DAILY. (Patient taking differently: TAKE HALF A TABLET BY MOUTH DAILY.) 30 tablet 11  . HYDROcodone-acetaminophen (NORCO/VICODIN) 5-325 MG tablet Take 1-2 tablets by mouth every 4 (four) hours as needed for moderate pain or severe pain. 30 tablet 0  . levothyroxine (SYNTHROID, LEVOTHROID) 50 MCG tablet TAKE 1 TABLET BY MOUTH DAILY BEFORE BREAKFAST 30 tablet 5  . lidocaine-prilocaine (EMLA) cream Apply to affected area once (Patient taking differently: Apply 1 application topically as needed (For port-a-cath.). ) 30 g 3  . Multiple Vitamin (MULTIVITAMIN WITH MINERALS) TABS tablet Take 1 tablet by mouth daily.    . Naphazoline HCl (CLEAR EYES OP) Place 2 drops into both eyes daily.    Earney Navy Bicarbonate (ZEGERID) 20-1100 MG CAPS capsule Take 1 capsule by mouth daily before breakfast.    . ondansetron (ZOFRAN) 4 MG tablet Take 1 tablet (4 mg total) by mouth every 4 (four) hours as needed for nausea. 10 tablet 0  . OVER THE COUNTER MEDICATION Take  475 mg by mouth daily. Tumeric 447m    . prochlorperazine (COMPAZINE) 10 MG tablet Take 1 tablet (10 mg total) by mouth every 6 (six) hours as needed (Nausea or vomiting). 30 tablet 1  . simvastatin (ZOCOR) 40 MG tablet TAKE 1 TABLET (40 MG TOTAL) BY MOUTH AT BEDTIME. 30 tablet 5  . venlafaxine XR (EFFEXOR-XR) 150 MG 24 hr capsule Take 1 capsule (150 mg total) by mouth daily. 90 capsule 3  . zolpidem (AMBIEN) 5 MG tablet Take one to two tablets qhs prn insomnia (Patient taking differently: Take 5-10 mg by mouth at bedtime as needed for sleep. ) 60 tablet 1   No current facility-administered medications for this visit.    OBJECTIVE: Middle-aged white womanIn Who appears stated age F71Vitals:   10/24/15 0921  BP: 148/68  Pulse: 71  Temp: 97.9 F (36.6 C)  Resp: 18     Body mass index is 28.11 kg/(m^2).    ECOG FS:0 - Asymptomatic  Sclerae unicteric, EOMs intact Oropharynx clear and moist No cervical or supraclavicular adenopathy Lungs no rales or rhonchi Heart regular rate and rhythm Abd soft, nontender, positive bowel sounds MSK no focal spinal tenderness, no upper extremity lymphedema Neuro: nonfocal, well oriented, appropriate affect Breasts: Deferred Skin: The port in the right upper anterior chest is intact.  LAB RESULTS:  CMP     Component Value Date/Time   NA 138 10/24/2015 0906   NA 139 10/04/2015 0925   K 4.3 10/24/2015 0906   K 4.1 10/04/2015 0925   CL 109 10/04/2015 0925   CO2 24 10/24/2015 0906   CO2 24 10/04/2015 0925   GLUCOSE 149* 10/24/2015 0906   GLUCOSE 104* 10/04/2015 0925   BUN 24.1 10/24/2015 0906   BUN 17 10/04/2015 0925   CREATININE 1.1 10/24/2015 0906   CREATININE 0.77 10/04/2015 0925   CALCIUM 10.2 10/24/2015 0906   CALCIUM 9.1 10/04/2015 0925   PROT 7.9 10/24/2015 0906   PROT 7.4 09/05/2015 1558   ALBUMIN 4.4 10/24/2015 0906   ALBUMIN 4.5 09/05/2015 1558   AST 19 10/24/2015 0906   AST 24 09/05/2015 1558   ALT 21 10/24/2015 0906    ALT 23 09/05/2015 1558   ALKPHOS 75 10/24/2015 0906   ALKPHOS 65 09/05/2015 1558   BILITOT 0.52 10/24/2015 0906   BILITOT 0.6 09/05/2015 1558  GFRNONAA >60 10/04/2015 0925   GFRAA >60 10/04/2015 0925    INo results found for: SPEP, UPEP  Lab Results  Component Value Date   WBC 11.7* 10/24/2015   NEUTROABS 9.5* 10/24/2015   HGB 14.3 10/24/2015   HCT 41.8 10/24/2015   MCV 84.8 10/24/2015   PLT 304 10/24/2015      Chemistry      Component Value Date/Time   NA 138 10/24/2015 0906   NA 139 10/04/2015 0925   K 4.3 10/24/2015 0906   K 4.1 10/04/2015 0925   CL 109 10/04/2015 0925   CO2 24 10/24/2015 0906   CO2 24 10/04/2015 0925   BUN 24.1 10/24/2015 0906   BUN 17 10/04/2015 0925   CREATININE 1.1 10/24/2015 0906   CREATININE 0.77 10/04/2015 0925      Component Value Date/Time   CALCIUM 10.2 10/24/2015 0906   CALCIUM 9.1 10/04/2015 0925   ALKPHOS 75 10/24/2015 0906   ALKPHOS 65 09/05/2015 1558   AST 19 10/24/2015 0906   AST 24 09/05/2015 1558   ALT 21 10/24/2015 0906   ALT 23 09/05/2015 1558   BILITOT 0.52 10/24/2015 0906   BILITOT 0.6 09/05/2015 1558       No results found for: LABCA2  No components found for: LABCA125  No results for input(s): INR in the last 168 hours.  Urinalysis    Component Value Date/Time   BILIRUBINUR 1+ 09/28/2014 1124   PROTEINUR 2+ 09/28/2014 1124   UROBILINOGEN 1.0 09/28/2014 1124   NITRITE positive 09/28/2014 1124   LEUKOCYTESUR large (3+)* 09/28/2014 1124      ELIGIBLE FOR AVAILABLE RESEARCH PROTOCOL: no  STUDIES: Dg Chest Port 1 View  10/04/2015  CLINICAL DATA:  Right-sided Port-A-Cath placement. Right breast carcinoma. EXAM: PORTABLE CHEST 1 VIEW COMPARISON:  09/05/2015 FINDINGS: Right-sided Port-A-Cath is seen with tip overlying the proximal SVC. No pneumothorax identified. Both lungs are clear. Heart size is normal. Thoracolumbar dextroscoliosis again noted. IMPRESSION: Right-sided Port-A-Cath tip in proximal SVC. No  evidence of pneumothorax. No active cardiopulmonary disease. Electronically Signed   By: Earle Gell M.D.   On: 10/04/2015 12:41   Dg Fluoro Guide Cv Line-no Report  10/04/2015  CLINICAL DATA:  FLOURO GUIDE CV LINE Fluoroscopy was utilized by the requesting physician.  No radiographic interpretation.    ASSESSMENT: 75 y.o. [place] woman status post left breast upper outer quadrant biopsy 08/19/2015 for a clinical T1b N0, stage IA invasive ductal carcinoma, strongly estrogen and progesterone receptor positive, HER-2 not amplified, with an MIB-1 of 10%  (1) left lumpectomy and sentinel lymph node sampling 09/09/2015 showed apT1b pN0, stage IA invasive ductal carcinoma, grade 1, repeat HER-2 again negative.   (2) Oncotype DX score of 29 predicts a 19% risk of recurrence outside the breast within the next 10 years if the patient's only systemic therapy is tamoxifen for 5 years. It also predicts a further risk reduction with chemotherapy of 8%.  (3) adjuvant chemotherapy will consist of cyclophosphamide and docetaxel every 21 days 4, to start 10/24/2015  (4) adjuvant radiation to follow chemotherapy  (5) anti-estrogen therapy to follow at the completion of local treatment  (6) genetics testing 09/19/2015 through the Breast/Ovarian gene panel offered by [facility] found no deleterious mutations in ATM, BARD1, BRCA1, BRCA2, BRIP1, CDH1, CHEK2, EPCAM, FANCC, MLH1, MSH2, MSH6, NBN, PALB2, PMS2, PTEN, RAD51C, RAD51D, TP53, and XRCC2.  PLAN: I reviewed with Ardra and her husband how to take all her supportive medicines. I gave her a copy of  a "roadmap" on how to take things then went over this in detail with them. We also went ahead and put on some more numbing cream and added a plastic cover so that she hopefully will not have significant pain when accessed today.  They came approximately 2 hours early for today's visit, and we will try to move the chemotherapy up today to facilitate things for  her.  We are going to use onPro and therefore I have canceled her days 3 visits here. She is going to see me again in week from today just to make sure she tolerated this first cycle well.  I reassured her that it is okay to use migraine for her migraines if that develops. She is going to call for any problems that may develop before her next visit here.  :Chauncey Cruel, MD   10/24/2015 9:46 AM Medical Oncology and Hematology [facility] [place], [place] [address] Tel. [phone]    Fax. [phone]

## 2015-10-24 ENCOUNTER — Ambulatory Visit (HOSPITAL_BASED_OUTPATIENT_CLINIC_OR_DEPARTMENT_OTHER): Payer: Medicare Other | Admitting: Oncology

## 2015-10-24 ENCOUNTER — Encounter: Payer: Self-pay | Admitting: *Deleted

## 2015-10-24 ENCOUNTER — Ambulatory Visit (HOSPITAL_BASED_OUTPATIENT_CLINIC_OR_DEPARTMENT_OTHER): Payer: Medicare Other

## 2015-10-24 ENCOUNTER — Other Ambulatory Visit (HOSPITAL_BASED_OUTPATIENT_CLINIC_OR_DEPARTMENT_OTHER): Payer: Medicare Other

## 2015-10-24 ENCOUNTER — Telehealth: Payer: Self-pay | Admitting: Oncology

## 2015-10-24 VITALS — BP 152/64 | HR 72 | Temp 98.7°F | Resp 17

## 2015-10-24 VITALS — BP 148/68 | HR 71 | Temp 97.9°F | Resp 18 | Ht 61.0 in | Wt 148.7 lb

## 2015-10-24 DIAGNOSIS — Z170 Estrogen receptor positive status [ER+]: Secondary | ICD-10-CM

## 2015-10-24 DIAGNOSIS — C50412 Malignant neoplasm of upper-outer quadrant of left female breast: Secondary | ICD-10-CM

## 2015-10-24 DIAGNOSIS — Z5189 Encounter for other specified aftercare: Secondary | ICD-10-CM

## 2015-10-24 DIAGNOSIS — Z5111 Encounter for antineoplastic chemotherapy: Secondary | ICD-10-CM

## 2015-10-24 LAB — COMPREHENSIVE METABOLIC PANEL
ALT: 21 U/L (ref 0–55)
AST: 19 U/L (ref 5–34)
Albumin: 4.4 g/dL (ref 3.5–5.0)
Alkaline Phosphatase: 75 U/L (ref 40–150)
Anion Gap: 12 mEq/L — ABNORMAL HIGH (ref 3–11)
BUN: 24.1 mg/dL (ref 7.0–26.0)
CO2: 24 mEq/L (ref 22–29)
Calcium: 10.2 mg/dL (ref 8.4–10.4)
Chloride: 102 mEq/L (ref 98–109)
Creatinine: 1.1 mg/dL (ref 0.6–1.1)
EGFR: 52 mL/min/{1.73_m2} — ABNORMAL LOW (ref >90)
Glucose: 149 mg/dl — ABNORMAL HIGH (ref 70–140)
Potassium: 4.3 mEq/L (ref 3.5–5.1)
Sodium: 138 mEq/L (ref 136–145)
Total Bilirubin: 0.52 mg/dL (ref 0.20–1.20)
Total Protein: 7.9 g/dL (ref 6.4–8.3)

## 2015-10-24 LAB — CBC WITH DIFFERENTIAL/PLATELET
BASO%: 0 % (ref 0.0–2.0)
Basophils Absolute: 0 10*3/uL (ref 0.0–0.1)
EOS%: 0 % (ref 0.0–7.0)
Eosinophils Absolute: 0 10*3/uL (ref 0.0–0.5)
HCT: 41.8 % (ref 34.8–46.6)
HGB: 14.3 g/dL (ref 11.6–15.9)
LYMPH%: 10.7 % — ABNORMAL LOW (ref 14.0–49.7)
MCH: 29 pg (ref 25.1–34.0)
MCHC: 34.2 g/dL (ref 31.5–36.0)
MCV: 84.8 fL (ref 79.5–101.0)
MONO#: 0.9 10*3/uL (ref 0.1–0.9)
MONO%: 8 % (ref 0.0–14.0)
NEUT#: 9.5 10*3/uL — ABNORMAL HIGH (ref 1.5–6.5)
NEUT%: 81.3 % — ABNORMAL HIGH (ref 38.4–76.8)
Platelets: 304 10*3/uL (ref 145–400)
RBC: 4.93 10*6/uL (ref 3.70–5.45)
RDW: 13.5 % (ref 11.2–14.5)
WBC: 11.7 10*3/uL — ABNORMAL HIGH (ref 3.9–10.3)
lymph#: 1.3 10*3/uL (ref 0.9–3.3)

## 2015-10-24 MED ORDER — SODIUM CHLORIDE 0.9 % IV SOLN
Freq: Once | INTRAVENOUS | Status: AC
  Administered 2015-10-24: 12:00:00 via INTRAVENOUS

## 2015-10-24 MED ORDER — PALONOSETRON HCL INJECTION 0.25 MG/5ML
0.2500 mg | Freq: Once | INTRAVENOUS | Status: AC
  Administered 2015-10-24: 0.25 mg via INTRAVENOUS

## 2015-10-24 MED ORDER — SODIUM CHLORIDE 0.9 % IV SOLN
75.0000 mg/m2 | Freq: Once | INTRAVENOUS | Status: AC
  Administered 2015-10-24: 130 mg via INTRAVENOUS
  Filled 2015-10-24: qty 13

## 2015-10-24 MED ORDER — SODIUM CHLORIDE 0.9 % IV SOLN
10.0000 mg | Freq: Once | INTRAVENOUS | Status: AC
  Administered 2015-10-24: 10 mg via INTRAVENOUS
  Filled 2015-10-24: qty 1

## 2015-10-24 MED ORDER — HEPARIN SOD (PORK) LOCK FLUSH 100 UNIT/ML IV SOLN
500.0000 [IU] | Freq: Once | INTRAVENOUS | Status: AC | PRN
  Administered 2015-10-24: 500 [IU]
  Filled 2015-10-24: qty 5

## 2015-10-24 MED ORDER — PALONOSETRON HCL INJECTION 0.25 MG/5ML
INTRAVENOUS | Status: AC
  Filled 2015-10-24: qty 5

## 2015-10-24 MED ORDER — SODIUM CHLORIDE 0.9% FLUSH
10.0000 mL | INTRAVENOUS | Status: DC | PRN
  Administered 2015-10-24: 10 mL
  Filled 2015-10-24: qty 10

## 2015-10-24 MED ORDER — CYCLOPHOSPHAMIDE CHEMO INJECTION 1 GM
600.0000 mg/m2 | Freq: Once | INTRAMUSCULAR | Status: AC
  Administered 2015-10-24: 1020 mg via INTRAVENOUS
  Filled 2015-10-24: qty 51

## 2015-10-24 MED ORDER — PEGFILGRASTIM 6 MG/0.6ML ~~LOC~~ PSKT
6.0000 mg | PREFILLED_SYRINGE | Freq: Once | SUBCUTANEOUS | Status: AC
  Administered 2015-10-24: 6 mg via SUBCUTANEOUS
  Filled 2015-10-24: qty 0.6

## 2015-10-24 NOTE — Telephone Encounter (Signed)
appt made and avs will print in treatment room °

## 2015-10-24 NOTE — Progress Notes (Signed)
Pt tolerated infusion well, Pt educated on discharge instructions and aware to call clinic with any questions or concerns pt verbalizes understanding. Pt in stable condition at time of discharge.

## 2015-10-24 NOTE — Patient Instructions (Addendum)
[facility] Discharge Instructions for Patients Receiving Chemotherapy  Today you received the following chemotherapy agents: Taxotere and Cytoxan   To help prevent nausea and vomiting after your treatment, we encourage you to take your nausea medication as directed.  Take Compazine 10 mg total by mouth every 6 hours as needed. DO NOT take zofran for 3 days. Starting Thursday you may start Zofran 4 mg total by mouth every 4 hours as needed for nausea.   If you develop nausea and vomiting that is not controlled by your nausea medication, call the clinic.   BELOW ARE SYMPTOMS THAT SHOULD BE REPORTED IMMEDIATELY:  *FEVER GREATER THAN 100.5 F  *CHILLS WITH OR WITHOUT FEVER  NAUSEA AND VOMITING THAT IS NOT CONTROLLED WITH YOUR NAUSEA MEDICATION  *UNUSUAL SHORTNESS OF BREATH  *UNUSUAL BRUISING OR BLEEDING  TENDERNESS IN MOUTH AND THROAT WITH OR WITHOUT PRESENCE OF ULCERS  *URINARY PROBLEMS  *BOWEL PROBLEMS  UNUSUAL RASH Items with * indicate a potential emergency and should be followed up as soon as possible.  Feel free to call the clinic you have any questions or concerns. The clinic phone number is [phone].  Please show the [facility] at check-in to the Emergency Department and triage nurse.   Docetaxel injection What is this medicine? DOCETAXEL (doe se TAX el) is a chemotherapy drug. It targets fast dividing cells, like cancer cells, and causes these cells to die. This medicine is used to treat many types of cancers like breast cancer, certain stomach cancers, head and neck cancer, lung cancer, and prostate cancer. This medicine may be used for other purposes; ask your health care provider or pharmacist if you have questions. What should I tell my health care provider before I take this medicine? They need to know if you have any of these conditions: -infection (especially a virus infection such as chickenpox, cold sores, or herpes) -liver  disease -low blood counts, like low white cell, platelet, or red cell counts -an unusual or allergic reaction to docetaxel, polysorbate 80, other chemotherapy agents, other medicines, foods, dyes, or preservatives -pregnant or trying to get pregnant -breast-feeding How should I use this medicine? This drug is given as an infusion into a vein. It is administered in a hospital or clinic by a specially trained health care professional. Talk to your pediatrician regarding the use of this medicine in children. Special care may be needed. Overdosage: If you think you have taken too much of this medicine contact a poison control center or emergency room at once. NOTE: This medicine is only for you. Do not share this medicine with others. What if I miss a dose? It is important not to miss your dose. Call your doctor or health care professional if you are unable to keep an appointment. What may interact with this medicine? -cyclosporine -erythromycin -ketoconazole -medicines to increase blood counts like filgrastim, pegfilgrastim, sargramostim -vaccines Talk to your doctor or health care professional before taking any of these medicines: -acetaminophen -aspirin -ibuprofen -ketoprofen -naproxen This list may not describe all possible interactions. Give your health care provider a list of all the medicines, herbs, non-prescription drugs, or dietary supplements you use. Also tell them if you smoke, drink alcohol, or use illegal drugs. Some items may interact with your medicine. What should I watch for while using this medicine? Your condition will be monitored carefully while you are receiving this medicine. You will need important blood work done while you are taking this medicine. This drug  may make you feel generally unwell. This is not uncommon, as chemotherapy can affect healthy cells as well as cancer cells. Report any side effects. Continue your course of treatment even though you feel ill  unless your doctor tells you to stop. In some cases, you may be given additional medicines to help with side effects. Follow all directions for their use. Call your doctor or health care professional for advice if you get a fever, chills or sore throat, or other symptoms of a cold or flu. Do not treat yourself. This drug decreases your body's ability to fight infections. Try to avoid being around people who are sick. This medicine may increase your risk to bruise or bleed. Call your doctor or health care professional if you notice any unusual bleeding. This medicine may contain alcohol in the product. You may get drowsy or dizzy. Do not drive, use machinery, or do anything that needs mental alertness until you know how this medicine affects you. Do not stand or sit up quickly, especially if you are an older patient. This reduces the risk of dizzy or fainting spells. Avoid alcoholic drinks. Do not become pregnant while taking this medicine. Women should inform their doctor if they wish to become pregnant or think they might be pregnant. There is a potential for serious side effects to an unborn child. Talk to your health care professional or pharmacist for more information. Do not breast-feed an infant while taking this medicine. What side effects may I notice from receiving this medicine? Side effects that you should report to your doctor or health care professional as soon as possible: -allergic reactions like skin rash, itching or hives, swelling of the face, lips, or tongue -low blood counts - This drug may decrease the number of white blood cells, red blood cells and platelets. You may be at increased risk for infections and bleeding. -signs of infection - fever or chills, cough, sore throat, pain or difficulty passing urine -signs of decreased platelets or bleeding - bruising, pinpoint red spots on the skin, black, tarry stools, nosebleeds -signs of decreased red blood cells - unusually weak or  tired, fainting spells, lightheadedness -breathing problems -fast or irregular heartbeat -low blood pressure -mouth sores -nausea and vomiting -pain, swelling, redness or irritation at the injection site -pain, tingling, numbness in the hands or feet -swelling of the ankle, feet, hands -weight gain Side effects that usually do not require medical attention (report to your prescriber or health care professional if they continue or are bothersome): -bone pain -complete hair loss including hair on your head, underarms, pubic hair, eyebrows, and eyelashes -diarrhea -excessive tearing -changes in the color of fingernails -loosening of the fingernails -nausea -muscle pain -red flush to skin -sweating -weak or tired This list may not describe all possible side effects. Call your doctor for medical advice about side effects. You may report side effects to FDA at 1-800-FDA-1088. Where should I keep my medicine? This drug is given in a hospital or clinic and will not be stored at home. NOTE: This sheet is a summary. It may not cover all possible information. If you have questions about this medicine, talk to your doctor, pharmacist, or health care provider.    2016, [employer] Standard. (2014-04-19 16:04:57)   Cyclophosphamide injection What is this medicine? CYCLOPHOSPHAMIDE (sye kloe FOSS fa mide) is a chemotherapy drug. It slows the growth of cancer cells. This medicine is used to treat many types of cancer like lymphoma, myeloma, leukemia, breast cancer, and  ovarian cancer, to name a few. This medicine may be used for other purposes; ask your health care provider or pharmacist if you have questions. What should I tell my health care provider before I take this medicine? They need to know if you have any of these conditions: -blood disorders -history of other chemotherapy -infection -kidney disease -liver disease -recent or ongoing radiation therapy -tumors in the bone  marrow -an unusual or allergic reaction to cyclophosphamide, other chemotherapy, other medicines, foods, dyes, or preservatives -pregnant or trying to get pregnant -breast-feeding How should I use this medicine? This drug is usually given as an injection into a vein or muscle or by infusion into a vein. It is administered in a hospital or clinic by a specially trained health care professional. Talk to your pediatrician regarding the use of this medicine in children. Special care may be needed. Overdosage: If you think you have taken too much of this medicine contact a poison control center or emergency room at once. NOTE: This medicine is only for you. Do not share this medicine with others. What if I miss a dose? It is important not to miss your dose. Call your doctor or health care professional if you are unable to keep an appointment. What may interact with this medicine? This medicine may interact with the following medications: -amiodarone -amphotericin B -azathioprine -certain antiviral medicines for HIV or AIDS such as protease inhibitors (e.g., indinavir, ritonavir) and zidovudine -certain blood pressure medications such as benazepril, captopril, enalapril, fosinopril, lisinopril, moexipril, monopril, perindopril, quinapril, ramipril, trandolapril -certain cancer medications such as anthracyclines (e.g., daunorubicin, doxorubicin), busulfan, cytarabine, paclitaxel, pentostatin, tamoxifen, trastuzumab -certain diuretics such as chlorothiazide, chlorthalidone, hydrochlorothiazide, indapamide, metolazone -certain medicines that treat or prevent blood clots like warfarin -certain muscle relaxants such as succinylcholine -cyclosporine -etanercept -indomethacin -medicines to increase blood counts like filgrastim, pegfilgrastim, sargramostim -medicines used as general anesthesia -metronidazole -natalizumab This list may not describe all possible interactions. Give your health care  provider a list of all the medicines, herbs, non-prescription drugs, or dietary supplements you use. Also tell them if you smoke, drink alcohol, or use illegal drugs. Some items may interact with your medicine. What should I watch for while using this medicine? Visit your doctor for checks on your progress. This drug may make you feel generally unwell. This is not uncommon, as chemotherapy can affect healthy cells as well as cancer cells. Report any side effects. Continue your course of treatment even though you feel ill unless your doctor tells you to stop. Drink water or other fluids as directed. Urinate often, even at night. In some cases, you may be given additional medicines to help with side effects. Follow all directions for their use. Call your doctor or health care professional for advice if you get a fever, chills or sore throat, or other symptoms of a cold or flu. Do not treat yourself. This drug decreases your body's ability to fight infections. Try to avoid being around people who are sick. This medicine may increase your risk to bruise or bleed. Call your doctor or health care professional if you notice any unusual bleeding. Be careful brushing and flossing your teeth or using a toothpick because you may get an infection or bleed more easily. If you have any dental work done, tell your dentist you are receiving this medicine. You may get drowsy or dizzy. Do not drive, use machinery, or do anything that needs mental alertness until you know how this medicine affects you. Do not  become pregnant while taking this medicine or for 1 year after stopping it. Women should inform their doctor if they wish to become pregnant or think they might be pregnant. Men should not father a child while taking this medicine and for 4 months after stopping it. There is a potential for serious side effects to an unborn child. Talk to your health care professional or pharmacist for more information. Do not  breast-feed an infant while taking this medicine. This medicine may interfere with the ability to have a child. This medicine has caused ovarian failure in some women. This medicine has caused reduced sperm counts in some men. You should talk with your doctor or health care professional if you are concerned about your fertility. If you are going to have surgery, tell your doctor or health care professional that you have taken this medicine. What side effects may I notice from receiving this medicine? Side effects that you should report to your doctor or health care professional as soon as possible: -allergic reactions like skin rash, itching or hives, swelling of the face, lips, or tongue -low blood counts - this medicine may decrease the number of white blood cells, red blood cells and platelets. You may be at increased risk for infections and bleeding. -signs of infection - fever or chills, cough, sore throat, pain or difficulty passing urine -signs of decreased platelets or bleeding - bruising, pinpoint red spots on the skin, black, tarry stools, blood in the urine -signs of decreased red blood cells - unusually weak or tired, fainting spells, lightheadedness -breathing problems -dark urine -dizziness -palpitations -swelling of the ankles, feet, hands -trouble passing urine or change in the amount of urine -weight gain -yellowing of the eyes or skin Side effects that usually do not require medical attention (report to your doctor or health care professional if they continue or are bothersome): -changes in nail or skin color -hair loss -missed menstrual periods -mouth sores -nausea, vomiting This list may not describe all possible side effects. Call your doctor for medical advice about side effects. You may report side effects to FDA at 1-800-FDA-1088. Where should I keep my medicine? This drug is given in a hospital or clinic and will not be stored at home. NOTE: This sheet is a  summary. It may not cover all possible information. If you have questions about this medicine, talk to your doctor, pharmacist, or health care provider.    2016, [employer] Standard. (2012-02-15 16:22:58)

## 2015-10-25 ENCOUNTER — Telehealth: Payer: Self-pay

## 2015-10-25 NOTE — Telephone Encounter (Signed)
[facility] for chemotherapy F/U.  Patient c/o bone pain in her jaw, took some hydrocodone and feels better.  Also c/o mouth "being sore"  Advised pt to call  Back is pain in mouth persists or if jaw pain worsens.  Denies n/v.  Denies any new side effects or symptoms.  Bowel and bladder is functioning well.  Eating and drinking well and I instructed to drink 64 oz minimum daily or at least the day before, of and after treatment.  Denies questions at this time and encouraged to call if needed.  Reviewed how to call after hours in the case of an emergency.   Pt also asked about an rx for lorazapam that was supposed to be at the pharmacy but wasn't, will call desk nurse to follow up on this

## 2015-10-25 NOTE — Telephone Encounter (Signed)
-----   Message from Egbert Garibaldi, RN sent at 10/24/2015  3:22 PM EDT ----- Regarding: Dr. [name] chemo f/u call  Pt of Dr. [name], First time Taxatere and Cytoxan. Pt tolerated infusion well.

## 2015-10-26 ENCOUNTER — Ambulatory Visit: Payer: Medicare Other

## 2015-10-26 ENCOUNTER — Other Ambulatory Visit: Payer: Self-pay | Admitting: *Deleted

## 2015-10-26 ENCOUNTER — Telehealth: Payer: Self-pay | Admitting: *Deleted

## 2015-10-26 MED ORDER — LORAZEPAM 0.5 MG PO TABS: 0.5000 mg | ORAL_TABLET | Freq: Three times a day (TID) | ORAL | Status: DC

## 2015-10-26 NOTE — Telephone Encounter (Signed)
This RN spoke with pt's husband per his call stating concern that ativan prescription has not been received by her pharmacy. Note ativan was discussed by ancillary nurse contacting pt per chemo follow up. Request not reviewed by MD yet-pt not having breakthrough nausea or anxiety per call yesterday.  This RN informed Mr Roloff above script called in to the pharmacy and addressed his concern regarding his verbalized frustration to staff he most recently spoke to. This RN apologuzed for delay in response. Mr Manner stated " well she is already late now in taking this - she was supposed to take it yesterday is that correct ?"  This RN informed Mr Almas the lorazepam is taken only if needed for breakthrough nausea or anxiety - it is not a medication that has to be taken for benefit of diagnosis.  Mr Arave verbalized understanding and appreciation of call.  No other needs at this time.

## 2015-10-28 ENCOUNTER — Other Ambulatory Visit: Payer: Self-pay

## 2015-10-28 DIAGNOSIS — C50412 Malignant neoplasm of upper-outer quadrant of left female breast: Secondary | ICD-10-CM

## 2015-10-28 NOTE — Telephone Encounter (Signed)
VAL-- just for future reference, ativan is on the "road map" for chemo supprotive meds-- for sleep on days when the pt takes steroids  Thanks!  GM

## 2015-10-30 NOTE — Progress Notes (Signed)
[place]  [phone] [phone]     ID: [name] DOB: 1940-10-10  MR#: [id number]  OHY#:[id number]  Patient Care Team: [name], MD as PCP - General Chauncey Cruel, MD as Consulting Physician (Oncology) Jackolyn Confer, MD as Consulting Physician (General Surgery) Eppie Gibson, MD as Attending Physician (Radiation Oncology) Richmond Campbell, MD as Consulting Physician (Gastroenterology) Lavonna Monarch, MD as Consulting Physician (Dermatology) PCP: [name], MD OTHER MD:  CHIEF COMPLAINT: Estrogen receptor positive breast cancer  CURRENT TREATMENT: Adjuvant chemotherapy with cyclophosphamide and docetaxel   BREAST CANCER HISTORY: From the original intake note:  [name] had bilateral screening mammography with tomography scheduled routinely 08/01/2015 at the [facility]. This showed a possible mass in the left breast. Accordingly on 08/15/2015 she underwent left diagnostic mammography with tomography and ultrasonography. This found the breast density to be category be. On spot compression views and was a 0.9 cm spiculated mass in the upper outer left breast, which on ultrasound was at 2:30 o'clock 5 cm from the nipple and measured 0.8 cm. Ultrasound of the axilla was benign.  Biopsy of the left breast mass in question 08/19/2015 found ([id number]) an invasive ductal carcinoma (E-cadherin positive) which was estrogen receptor 100% positive, progesterone receptor 100% positive, both with strong staining intensity, with an MIB-1 of 10%, and no HER-2 amplification, the signals ratio being 1.23 and the number per cell 1.85.  Her subsequent history is as detailed below  INTERVAL HISTORY: [name] returns today for follow-up of her estrogen receptor positive breast cancer ompanied by her husband [name]. Today is day 8 cycle 1 of 4 planned cycles of cyclophosphamide and docetaxel given 21 days apart.   She had significant pain from  her Neulasta shot. This particularly involved her jaw. She used hydrocodone for this. That worked very well. She later had other aches and pains for which she did not take Aleve or Motrin because she thought she was not supposed to.   Her lorazepam prescription apparently had not gone through the pharmacy and that was a concern as well. She ended up taking it pretty much every night to help her sleep. On Friday night however she fell twice. She is having vertigo problems, which is a chronic issue with her. On Saturday, which is day 6 she was just worn out and spent a holding bed. She did better yesterday.  REVIEW OF SYSTEMS: In addition to the problems just described, she had nausea, and on day 2 felt like retching a little but never did vomit. She had a funny feeling in her right foot but that has resolved. She doesn't have any peripheral neuropathy symptoms. She got constipated from taking the hydrocodone and then had loose bowel movements or 2 or 3 days but that also resolved. She has a sore mouth but no mouth sores. She is using Biotene for this. In general she has no appetite. She sleeps poorly. She had a little bit of a runny nose and sinus symptoms and hoarseness she had a mild headache. A detailed review of systems today was otherwise stable  PAST MEDICAL HISTORY: Past Medical History  Diagnosis Date  . Anemia   . GERD (gastroesophageal reflux disease)   . Headache(784.0)   . Ulcer   . Kidney stones   . Rheumatic fever     age 75  . UTI (lower urinary tract infection)   . Osteopenia   . Hyperlipidemia   . Thyroid disease  Hypothyroid  . Osteoporosis   . Atrophic vaginitis   . Hypertension   . Endometriosis   . Hypothyroidism   . Anxiety   . Arthritis   . Cancer [facility])     breast cancer  . Family history of breast cancer   . Family history of colon cancer   . Heart murmur     as a child only  . Sleep apnea     " very mild" does not wear CPAP    PAST SURGICAL  HISTORY: Past Surgical History  Procedure Laterality Date  . Laparoscopic endometriosis fulguration  1978  . Ulnar nerve repair  2010  . Tonsillectomy    . Pelvic laparoscopy    . Breast lumpectomy with radioactive seed and sentinel lymph node biopsy Left 09/09/2015    Procedure: LEFT BREAST LUMPECTOMY WITH RADIOACTIVE SEED AND SENTINEL LYMPH NODE BIOPSY;  Surgeon: Jackolyn Confer, MD;  Location: [facility];  Service: General;  Laterality: Left;  . Portacath placement N/A 10/04/2015    Procedure: INSERTION PORT-A-CATH WITH ULTRASOUND;  Surgeon: Jackolyn Confer, MD;  Location: [place];  Service: General;  Laterality: N/A;    FAMILY HISTORY Family History  Problem Relation Age of Onset  . Breast cancer Mother     Age 31  . Hypertension Mother   . Heart disease Father   . Stroke Father   . Colon cancer Father     dx 44s  . Breast cancer Paternal Grandmother     Age 34  . Diabetes Paternal Grandmother   . Heart disease Maternal Grandmother   The patient's father died at age 11 the patient's mother was diagnosed with breast cancer at age 66 and again at age 57, which was the year of her death. In addition a paternal grandmother was diagnosed with breast cancer in her 52s. The patient's father had colon cancer. The patient had 2 brothers, no sisters. There is no history of ovarian cancer in the family.  GYNECOLOGIC HISTORY:  No LMP recorded. Patient is postmenopausal. Menarche age 33. The patient is GX P0. She went through menopause in her late 51s. She did not take hormone replacement. She never used oral contraceptives.  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 Her husband [name]") is retired from [employer]. They have an adopted son, [name], who is completing a second Engineer, production degree at [employer] state area he carries a diagnosis of schizophrenia, but is obviously very functional. The patient has no grandchildren. She is a Chief of Staff    ADVANCED  DIRECTIVES:  NOT IN PLACE   HEALTH MAINTENANCE: Social History  Substance Use Topics  . Smoking status: Never Smoker   . Smokeless tobacco: Never Used  . Alcohol Use: No     Colonoscopy: 2012/[facility]   PAP: 2013?   Bone density: 2012/osteopenia   Lipid panel:  Allergies  Allergen Reactions  . Nitrofurantoin Nausea And Vomiting    GI upset  . Sulfamethoxazole-Trimethoprim Nausea And Vomiting and Other (See Comments)    GI upset    Current Outpatient Prescriptions  Medication Sig Dispense Refill  . acetaminophen (TYLENOL) 500 MG tablet Take 1,000 mg by mouth every 6 (six) hours as needed (For pain.).    Marland Kitchen atenolol (TENORMIN) 50 MG tablet TAKE 1 TABLET (50 MG TOTAL) BY MOUTH DAILY. 30 tablet 5  . chlorhexidine (PERIDEX) 0.12 % solution Use as directed 15 mLs in the mouth or throat 2 (two) times daily.     . Cholecalciferol (VITAMIN  D) 2000 UNITS CAPS Take 2,000 Units by mouth daily.     Marland Kitchen dexamethasone (DECADRON) 4 MG tablet Take 2 tablets (8 mg total) by mouth 2 (two) times daily. Start the day before Taxotere. Then again the day after chemo for 3 days. 30 tablet 1  . fish oil-omega-3 fatty acids 1000 MG capsule Take 2 g by mouth daily.      . hydrochlorothiazide (HYDRODIURIL) 25 MG tablet TAKE 1 TABLET (25 MG TOTAL) BY MOUTH DAILY. (Patient taking differently: TAKE HALF A TABLET BY MOUTH DAILY.) 30 tablet 11  . HYDROcodone-acetaminophen (NORCO/VICODIN) 5-325 MG tablet Take 1-2 tablets by mouth every 4 (four) hours as needed for moderate pain or severe pain. 30 tablet 0  . levothyroxine (SYNTHROID, LEVOTHROID) 50 MCG tablet TAKE 1 TABLET BY MOUTH DAILY BEFORE BREAKFAST 30 tablet 5  . lidocaine-prilocaine (EMLA) cream Apply to affected area once (Patient taking differently: Apply 1 application topically as needed (For port-a-cath.). ) 30 g 3  . LORazepam (ATIVAN) 0.5 MG tablet Take 1 tablet (0.5 mg total) by mouth every 8 (eight) hours. 30 tablet 2  . Multiple Vitamin (MULTIVITAMIN  WITH MINERALS) TABS tablet Take 1 tablet by mouth daily.    . Naphazoline HCl (CLEAR EYES OP) Place 2 drops into both eyes daily.    Earney Navy Bicarbonate (ZEGERID) 20-1100 MG CAPS capsule Take 1 capsule by mouth daily before breakfast.    . ondansetron (ZOFRAN) 4 MG tablet Take 1 tablet (4 mg total) by mouth every 4 (four) hours as needed for nausea. 10 tablet 0  . OVER THE COUNTER MEDICATION Take 475 mg by mouth daily. Tumeric 46m    . prochlorperazine (COMPAZINE) 10 MG tablet Take 1 tablet (10 mg total) by mouth every 6 (six) hours as needed (Nausea or vomiting). 30 tablet 1  . simvastatin (ZOCOR) 40 MG tablet TAKE 1 TABLET (40 MG TOTAL) BY MOUTH AT BEDTIME. 30 tablet 5  . venlafaxine XR (EFFEXOR-XR) 150 MG 24 hr capsule Take 1 capsule (150 mg total) by mouth daily. 90 capsule 3  . zolpidem (AMBIEN) 5 MG tablet Take one to two tablets qhs prn insomnia (Patient taking differently: Take 5-10 mg by mouth at bedtime as needed for sleep. ) 60 tablet 1   No current facility-administered medications for this visit.    OBJECTIVE: Middle-aged white womanIn in no acute distress  Filed Vitals:   10/31/15 0851  BP: 120/52  Pulse: 111  Temp: 97.8 F (36.6 C)  Resp: 17     Body mass index is 27.77 kg/(m^2).    ECOG FS:1 - Symptomatic but completely ambulatory  Sclerae unicteric, pupils round and equal Oropharynx clear and slightly dry No cervical or supraclavicular adenopathy Lungs no rales or rhonchi Heart regular rate and rhythm Abd soft, nontender, positive bowel sounds MSK no focal spinal tenderness, no upper extremity lymphedema Neuro: nonfocal, well oriented, appropriate affect Breasts: Deferred   Skin: The port in the right upper anterior chest is intact.  LAB RESULTS:  CMP     Component Value Date/Time   NA 136 10/31/2015 0833   NA 139 10/04/2015 0925   K 4.1 10/31/2015 0833   K 4.1 10/04/2015 0925   CL 109 10/04/2015 0925   CO2 22 10/31/2015 0833   CO2 24  10/04/2015 0925   GLUCOSE 146* 10/31/2015 0833   GLUCOSE 104* 10/04/2015 0925   BUN 13.7 10/31/2015 0833   BUN 17 10/04/2015 0925   CREATININE 0.9 10/31/2015 0833   CREATININE  0.77 10/04/2015 0925   CALCIUM 9.1 10/31/2015 0833   CALCIUM 9.1 10/04/2015 0925   PROT 6.7 10/31/2015 0833   PROT 7.4 09/05/2015 1558   ALBUMIN 3.5 10/31/2015 0833   ALBUMIN 4.5 09/05/2015 1558   AST 19 10/31/2015 0833   AST 24 09/05/2015 1558   ALT 18 10/31/2015 0833   ALT 23 09/05/2015 1558   ALKPHOS 93 10/31/2015 0833   ALKPHOS 65 09/05/2015 1558   BILITOT 0.60 10/31/2015 0833   BILITOT 0.6 09/05/2015 1558   GFRNONAA >60 10/04/2015 0925   GFRAA >60 10/04/2015 0925    INo results found for: SPEP, UPEP  Lab Results  Component Value Date   WBC 9.9 10/31/2015   NEUTROABS 8.4* 10/31/2015   HGB 13.4 10/31/2015   HCT 39.8 10/31/2015   MCV 85.0 10/31/2015   PLT 182 10/31/2015      Chemistry      Component Value Date/Time   NA 136 10/31/2015 0833   NA 139 10/04/2015 0925   K 4.1 10/31/2015 0833   K 4.1 10/04/2015 0925   CL 109 10/04/2015 0925   CO2 22 10/31/2015 0833   CO2 24 10/04/2015 0925   BUN 13.7 10/31/2015 0833   BUN 17 10/04/2015 0925   CREATININE 0.9 10/31/2015 0833   CREATININE 0.77 10/04/2015 0925      Component Value Date/Time   CALCIUM 9.1 10/31/2015 0833   CALCIUM 9.1 10/04/2015 0925   ALKPHOS 93 10/31/2015 0833   ALKPHOS 65 09/05/2015 1558   AST 19 10/31/2015 0833   AST 24 09/05/2015 1558   ALT 18 10/31/2015 0833   ALT 23 09/05/2015 1558   BILITOT 0.60 10/31/2015 0833   BILITOT 0.6 09/05/2015 1558       No results found for: LABCA2  No components found for: [id number]  No results for input(s): INR in the last 168 hours.  Urinalysis    Component Value Date/Time   BILIRUBINUR 1+ 09/28/2014 1124   PROTEINUR 2+ 09/28/2014 1124   UROBILINOGEN 1.0 09/28/2014 1124   NITRITE positive 09/28/2014 1124   LEUKOCYTESUR large (3+)* 09/28/2014 1124      ELIGIBLE FOR  AVAILABLE RESEARCH PROTOCOL: no  STUDIES: Dg Chest Port 1 View  10/04/2015  CLINICAL DATA:  Right-sided Port-A-Cath placement. Right breast carcinoma. EXAM: PORTABLE CHEST 1 VIEW COMPARISON:  09/05/2015 FINDINGS: Right-sided Port-A-Cath is seen with tip overlying the proximal SVC. No pneumothorax identified. Both lungs are clear. Heart size is normal. Thoracolumbar dextroscoliosis again noted. IMPRESSION: Right-sided Port-A-Cath tip in proximal SVC. No evidence of pneumothorax. No active cardiopulmonary disease. Electronically Signed   By: Earle Gell M.D.   On: 10/04/2015 12:41   Dg Fluoro Guide Cv Line-no Report  10/04/2015  CLINICAL DATA:  FLOURO GUIDE CV LINE Fluoroscopy was utilized by the requesting physician.  No radiographic interpretation.    ASSESSMENT: 75 y.o. [place] woman status post left breast upper outer quadrant biopsy 08/19/2015 for a clinical T1b N0, stage IA invasive ductal carcinoma, strongly estrogen and progesterone receptor positive, HER-2 not amplified, with an MIB-1 of 10%  (1) left lumpectomy and sentinel lymph node sampling 09/09/2015 showed apT1b pN0, stage IA invasive ductal carcinoma, grade 1, repeat HER-2 again negative.   (2) Oncotype DX score of 29 predicts a 19% risk of recurrence outside the breast within the next 10 years if the patient's only systemic therapy is tamoxifen for 5 years. It also predicts a further risk reduction with chemotherapy of 8%.  (3) adjuvant chemotherapy will consist of cyclophosphamide  and docetaxel every 21 days 4, started 10/24/2015  (4) adjuvant radiation to follow chemotherapy  (5) anti-estrogen therapy to follow at the completion of local treatment  (6) genetics testing 09/19/2015 through the Breast/Ovarian gene panel offered by [facility] found no deleterious mutations in ATM, BARD1, BRCA1, BRCA2, BRIP1, CDH1, CHEK2, EPCAM, FANCC, MLH1, MSH2, MSH6, NBN, PALB2, PMS2, PTEN, RAD51C, RAD51D, TP53, and XRCC2.  PLAN: I would say  [name] did fair with her first cycle but we can do better. First of all she does need to hydrate herself more. I suggested she measure about a quart of water every morning and make sure to drink that by noon. Secondly, we are going to stretch out her Decadron so that beginning on day 5, she will take a half a tablet every morning for 3 days. That should help with the fatigue issue.  As far as sleep is concerned she will use lorazepam during the first week but not for the subsequent 2 weeks. She will use Benadryl or Ambien those nights. I'm going ahead and writing her a prescription for a cane which I think will make a little bit more stable over the next couple of months.   I don't think the lorazepam was contributing to her vertigo, but it may have contributed to her falling. Finally as far as the bone pain is concerned she is welcome to use Aleve or Motrin or Tylenol or oxycodone or hydrocodone.  I think with these simple measures and changes she will do better with cycle 2. She will see our physicians assistant on day 1 of cycle 2 and then she will see me a week later to assess tolerance.    :Chauncey Cruel, MD   10/31/2015 9:20 AM Medical Oncology and Hematology [facility] [place],  [address] Tel. [phone]    Fax. [phone]

## 2015-10-31 ENCOUNTER — Ambulatory Visit (HOSPITAL_BASED_OUTPATIENT_CLINIC_OR_DEPARTMENT_OTHER): Payer: Medicare Other | Admitting: Oncology

## 2015-10-31 ENCOUNTER — Telehealth: Payer: Self-pay | Admitting: Oncology

## 2015-10-31 ENCOUNTER — Other Ambulatory Visit (HOSPITAL_BASED_OUTPATIENT_CLINIC_OR_DEPARTMENT_OTHER): Payer: Medicare Other

## 2015-10-31 VITALS — BP 120/52 | HR 111 | Temp 97.8°F | Resp 17 | Ht 61.0 in | Wt 146.9 lb

## 2015-10-31 DIAGNOSIS — C50412 Malignant neoplasm of upper-outer quadrant of left female breast: Secondary | ICD-10-CM

## 2015-10-31 DIAGNOSIS — Z170 Estrogen receptor positive status [ER+]: Secondary | ICD-10-CM | POA: Diagnosis not present

## 2015-10-31 LAB — COMPREHENSIVE METABOLIC PANEL
ALT: 18 U/L (ref 0–55)
AST: 19 U/L (ref 5–34)
Albumin: 3.5 g/dL (ref 3.5–5.0)
Alkaline Phosphatase: 93 U/L (ref 40–150)
Anion Gap: 13 mEq/L — ABNORMAL HIGH (ref 3–11)
BUN: 13.7 mg/dL (ref 7.0–26.0)
CO2: 22 mEq/L (ref 22–29)
Calcium: 9.1 mg/dL (ref 8.4–10.4)
Chloride: 102 mEq/L (ref 98–109)
Creatinine: 0.9 mg/dL (ref 0.6–1.1)
EGFR: 59 mL/min/{1.73_m2} — ABNORMAL LOW (ref >90)
Glucose: 146 mg/dl — ABNORMAL HIGH (ref 70–140)
Potassium: 4.1 mEq/L (ref 3.5–5.1)
Sodium: 136 mEq/L (ref 136–145)
Total Bilirubin: 0.6 mg/dL (ref 0.20–1.20)
Total Protein: 6.7 g/dL (ref 6.4–8.3)

## 2015-10-31 LAB — CBC WITH DIFFERENTIAL/PLATELET
BASO%: 0.3 % (ref 0.0–2.0)
Basophils Absolute: 0 10*3/uL (ref 0.0–0.1)
EOS%: 0.9 % (ref 0.0–7.0)
Eosinophils Absolute: 0.1 10*3/uL (ref 0.0–0.5)
HCT: 39.8 % (ref 34.8–46.6)
HGB: 13.4 g/dL (ref 11.6–15.9)
LYMPH%: 12.2 % — ABNORMAL LOW (ref 14.0–49.7)
MCH: 28.5 pg (ref 25.1–34.0)
MCHC: 33.6 g/dL (ref 31.5–36.0)
MCV: 85 fL (ref 79.5–101.0)
MONO#: 0.1 10*3/uL (ref 0.1–0.9)
MONO%: 1.2 % (ref 0.0–14.0)
NEUT#: 8.4 10*3/uL — ABNORMAL HIGH (ref 1.5–6.5)
NEUT%: 85.4 % — ABNORMAL HIGH (ref 38.4–76.8)
Platelets: 182 10*3/uL (ref 145–400)
RBC: 4.68 10*6/uL (ref 3.70–5.45)
RDW: 14 % (ref 11.2–14.5)
WBC: 9.9 10*3/uL (ref 3.9–10.3)
lymph#: 1.2 10*3/uL (ref 0.9–3.3)

## 2015-10-31 NOTE — Telephone Encounter (Signed)
Gave patient avs report and appointments for July thru September. LOS 7/17.

## 2015-11-02 ENCOUNTER — Other Ambulatory Visit: Payer: Self-pay | Admitting: Family Medicine

## 2015-11-02 ENCOUNTER — Telehealth: Payer: Self-pay | Admitting: Family Medicine

## 2015-11-02 NOTE — Telephone Encounter (Signed)
[facility]  Patient Name: [name]  DOB: January 10, 1941    Initial Comment Caller states she is a Chemo patient and is having a lot of issues sleeping. She was told to take Ambien by Chemo Doctor, but Dr. Elease Hashimoto refused to fill the medication.    Nurse Assessment  Nurse: Harlow Mares, RN, Suanne Marker Date/Time (Eastern Time): 11/02/2015 4:16:27 PM  Confirm and document reason for call. If symptomatic, describe symptoms. You must click the next button to save text entered. ---Caller states she is a Chemo patient and is having a lot of issues sleeping. She was told to take Ambien by Chemo Doctor, but Dr. Elease Hashimoto refused to fill the medication. Reports that she has gotten Ambien from Dr. Elease Hashimoto. Reports that she called the pharmacy for the refill and was told that he denied the request. Caller reports that she has breast cancer. Caller reports that Dr. Elease Hashimoto told her that he trusted her to take the medication as prescribed. This is not an early request. She has a good progress, and doesn't expect to take this medication. Reports that she is going thru a difficult time and can not sleep. She has not tried anything else besides the [place] and is wondering if there is another sleep aid that can be considered. Melatonin has been tried and didn't work. She hasn't asked her chemo MD for this med because Dr. Elease Hashimoto has always prescribed this before.  Has the patient traveled out of the country within the last 30 days? ---No  Does the patient have any new or worsening symptoms? ---Yes  Will a triage be completed? ---No  Select reason for no triage. ---Patient declined  Please document clinical information provided and list any resource used. ---Caller reports that she would like to communicate with the MD office via [place], but she is currently having issues with this. She is requesting that someone from the office call her to discuss the Ambien issue ASAP  please. She is willing to see the MD if necessary to discuss in person. She reports that she has taken a class on insomnia/sleeping issues but feels that she needs a sleep aid at this time to help her cope with her current situation. Advised that nurse will alert MD office staff and asked that someone call her to discuss via this report. Advised caller to call back for any new/worsening symptoms for advice/triage. Caller voiced understanding.     Guidelines    Guideline Title Affirmed Question Affirmed Notes       Final Disposition User

## 2015-11-04 MED ORDER — ZOLPIDEM TARTRATE 5 MG PO TABS: ORAL_TABLET | ORAL | Status: DC

## 2015-11-04 NOTE — Telephone Encounter (Signed)
i do not have any recollection of denying her Ambien.  What we probably did tell her is not to take the Lorazepam and Ambien concomitantly.  May refill for 3 months.

## 2015-11-04 NOTE — Telephone Encounter (Signed)
I called the pt and informed her of the message below.  She is aware the refill was called to her pharmacy.

## 2015-11-06 ENCOUNTER — Emergency Department (HOSPITAL_COMMUNITY)
Admission: EM | Admit: 2015-11-06 | Discharge: 2015-11-06 | Disposition: A | Discharge status: Home / Self Care | Payer: Medicare Other | Attending: Emergency Medicine | Admitting: Emergency Medicine

## 2015-11-06 ENCOUNTER — Encounter (HOSPITAL_COMMUNITY): Payer: Self-pay

## 2015-11-06 DIAGNOSIS — Y929 Unspecified place or not applicable: Secondary | ICD-10-CM | POA: Insufficient documentation

## 2015-11-06 DIAGNOSIS — I10 Essential (primary) hypertension: Secondary | ICD-10-CM | POA: Insufficient documentation

## 2015-11-06 DIAGNOSIS — Y999 Unspecified external cause status: Secondary | ICD-10-CM | POA: Insufficient documentation

## 2015-11-06 DIAGNOSIS — C50412 Malignant neoplasm of upper-outer quadrant of left female breast: Secondary | ICD-10-CM | POA: Insufficient documentation

## 2015-11-06 DIAGNOSIS — H9202 Otalgia, left ear: Secondary | ICD-10-CM | POA: Diagnosis not present

## 2015-11-06 DIAGNOSIS — W268XXA Contact with other sharp object(s), not elsewhere classified, initial encounter: Secondary | ICD-10-CM | POA: Insufficient documentation

## 2015-11-06 DIAGNOSIS — E039 Hypothyroidism, unspecified: Secondary | ICD-10-CM | POA: Insufficient documentation

## 2015-11-06 DIAGNOSIS — Y939 Activity, unspecified: Secondary | ICD-10-CM | POA: Insufficient documentation

## 2015-11-06 DIAGNOSIS — S0991XA Unspecified injury of ear, initial encounter: Secondary | ICD-10-CM | POA: Diagnosis present

## 2015-11-06 MED ORDER — CIPROFLOXACIN-DEXAMETHASONE 0.3-0.1 % OT SUSP: 4.0000 [drp] | Freq: Two times a day (BID) | OTIC | Status: DC

## 2015-11-06 MED ORDER — CIPROFLOXACIN-DEXAMETHASONE 0.3-0.1 % OT SUSP: 4.0000 [drp] | Freq: Two times a day (BID) | OTIC | 0 refills | Status: DC

## 2015-11-06 NOTE — Discharge Instructions (Signed)
You were seen in the ED today with an ear injury. Your eardrum may be injured and will require ENT consultation. We have provided ear drops to use and the phone number for ENT. Call them tomorrow and they will arrange a clinic appointment in their office.

## 2015-11-06 NOTE — ED Triage Notes (Signed)
patient here with left ear injury after sticking tweezers in ear this am, bleeding noted from ear with a clot on arival. Patient currently receiving chemo. Wearing mask on arival

## 2015-11-06 NOTE — ED Provider Notes (Signed)
Emergency Department Provider Note  Time seen: Approximately 1:01 PM  I have reviewed the triage vital signs and the nursing notes.   HISTORY  Chief Complaint Ear Injury   HPI [name] is a 75 y.o. female patient with PMH of breast cancer currently on chemotherapy presents to the emergency department for evaluation of left ear pain and bleeding in the setting of trauma. The patient reports feeling something crawling around her ear this morning. She took tweezers and began trying to extract the object. She is unsure exactly what was in her ear. She feels that she was probing fairly deeply and had sudden onset severe pain and bleeding. The bleeding has continued. She notes decreased hearing on that side. No obvious foreign body removed.   Past Medical History:  Diagnosis Date  . Anemia   . Anxiety   . Arthritis   . Atrophic vaginitis   . Cancer [facility])    breast cancer  . Endometriosis   . Family history of breast cancer   . Family history of colon cancer   . GERD (gastroesophageal reflux disease)   . Headache(784.0)   . Heart murmur    as a child only  . Hyperlipidemia   . Hypertension   . Hypothyroidism   . Kidney stones   . Osteopenia   . Osteoporosis   . Rheumatic fever    age 85  . Sleep apnea    " very mild" does not wear CPAP  . Thyroid disease    Hypothyroid  . Ulcer   . UTI (lower urinary tract infection)     Patient Active Problem List   Diagnosis Date Noted  . Genetic testing 09/23/2015  . Breast cancer of upper-outer quadrant of left female breast ([place]) 09/07/2015  . Family history of breast cancer   . Family history of colon cancer   . Prediabetes 08/30/2014  . Endometriosis   . Osteoporosis   . Hypertension   . UTI (lower urinary tract infection) 03/05/2011  . Dysuria 03/05/2011  . Hypothyroidism 06/06/2009  . FATIGUE 06/06/2009  . ELEVATED BLOOD PRESSURE 06/06/2009  . OSTEOPENIA 09/09/2008  . SCOLIOSIS 09/09/2008  . Hyperlipidemia  07/30/2008  . MIGRAINE HEADACHE 07/30/2008  . GERD 07/30/2008  . ENTHESOPATHY OF HIP REGION 07/30/2008  . HEADACHE 07/30/2008    Past Surgical History:  Procedure Laterality Date  . BREAST LUMPECTOMY WITH RADIOACTIVE SEED AND SENTINEL LYMPH NODE BIOPSY Left 09/09/2015   Procedure: LEFT BREAST LUMPECTOMY WITH RADIOACTIVE SEED AND SENTINEL LYMPH NODE BIOPSY;  Surgeon: Jackolyn Confer, MD;  Location: [place];  Service: General;  Laterality: Left;  . LAPAROSCOPIC ENDOMETRIOSIS FULGURATION  1978  . PELVIC LAPAROSCOPY    . PORTACATH PLACEMENT N/A 10/04/2015   Procedure: INSERTION PORT-A-CATH WITH ULTRASOUND;  Surgeon: Jackolyn Confer, MD;  Location: [place];  Service: General;  Laterality: N/A;  . TONSILLECTOMY    . ULNAR NERVE REPAIR  2010    Current Outpatient Rx  . Order #: [id number] Class: Historical Med  . Order #: [id number] Class: Normal  . Order #: [id number] Class: Historical Med  . Order #: [id number] Class: Normal  . Order #: [id number] Class: Normal  . Order #: [id number] Class: Print  . Order #: [id number] Class: Normal  . Order #: [id number] Class: Normal  . Order #: [id number] Class: Phone In  . Order #: [id number] Class: Historical Med  . Order #: [id number] Class: Historical Med  . Order #: [id number] Class: Historical Med  . Order #: [id number] Class: Print  . Order #: [id number] Class: Historical Med  .  Order #: [id number] Class: Normal  . Order #: [id number] Class: Normal  . Order #: [id number] Class: Normal  . Order #: [id number] Class: Print  . Order #: [id number] Class: Phone In    Allergies Nitrofurantoin and Sulfamethoxazole-trimethoprim  Family History  Problem Relation Age of Onset  . Breast cancer Mother     Age 75  . Hypertension Mother   . Heart disease Father   . Stroke Father   . Colon cancer Father     dx 13s  . Breast cancer Paternal Grandmother     Age 14  . Diabetes Paternal Grandmother   . Heart disease Maternal Grandmother     Social History Social History  Substance  Use Topics  . Smoking status: Never Smoker  . Smokeless tobacco: Never Used  . Alcohol use No    Review of Systems  Constitutional: No fever/chills Eyes: No visual changes. ENT: No sore throat. Ear pain and bleeding.  Cardiovascular: Denies chest pain. Respiratory: Denies shortness of breath. Gastrointestinal: No abdominal pain.  No nausea, no vomiting.  No diarrhea.  No constipation. Genitourinary: Negative for dysuria. Musculoskeletal: Negative for back pain. Skin: Negative for rash. Neurological: Negative for headaches, focal weakness or numbness.  10-point ROS otherwise negative.  ____________________________________________   PHYSICAL EXAM:  VITAL SIGNS: ED Triage Vitals [11/06/15 1108]  Enc Vitals Group     BP (!) 151/53     Pulse Rate 63     Resp 18     Temp 98.2 F (36.8 C)     Temp Source Oral     SpO2 99 %   Constitutional: Alert and oriented. Well appearing and in no acute distress. Eyes: Conjunctivae are normal. PERRL. EOMI. Head: Atraumatic. Ears:  BRB in the left ear canal with 3/4 of TM visualized. Remaining TM is partially obscured by blood. No foreign body appreciated.  Nose: No congestion/rhinnorhea. Mouth/Throat: Mucous membranes are moist.  Oropharynx non-erythematous. Neck: No stridor. Cardiovascular: Normal rate, regular rhythm. Good peripheral circulation. Grossly normal heart sounds.   Respiratory: Normal respiratory effort.  No retractions. Lungs CTAB. Gastrointestinal: Soft and nontender. No distention.  Musculoskeletal: No lower extremity tenderness nor edema. No gross deformities of extremities. Neurologic:  Normal speech and language. No gross focal neurologic deficits are appreciated.  Skin:  Skin is warm, dry and intact. No rash noted.  ____________________________________________   LABS (all labs ordered are listed, but only abnormal results are  displayed)  None ____________________________________________   PROCEDURES  Procedure(s) performed:   Procedures  None ____________________________________________   INITIAL IMPRESSION / ASSESSMENT AND PLAN / ED COURSE  Pertinent labs & imaging results that were available during my care of the patient were reviewed by me and considered in my medical decision making (see chart for details).  Patient with BRB to the left external ear canal with lower anterior quadrant of the TM obscured by blood. Remainder of the TM appears intact. Patient with pain and decreased hearing grossly. Plan for discussion with ENT regarding antibiotic ppx and close f/u. Bleeding is small in volume and is well controlled at this time.   01:45 PM Spoke with Dr. Benjamine Mola with ENT. He requests that the patient begin taking Ciprodex otic drops 4 drops BID and follow with him in clinic for exam tomorrow. Discussed plan with patient and husband who are pleased at discharge.  ____________________________________________  FINAL CLINICAL IMPRESSION(S) / ED DIAGNOSES  Final diagnoses:  Ear pain, left     MEDICATIONS GIVEN DURING THIS VISIT:  None  NEW OUTPATIENT  MEDICATIONS STARTED DURING THIS VISIT:  Ciprodex otic drops.    Note:  This document was prepared using Dragon voice recognition software and may include unintentional dictation errors.  Nanda Quinton, MD Emergency Medicine   Margette Fast, MD 11/06/15 [phone]

## 2015-11-07 ENCOUNTER — Ambulatory Visit (INDEPENDENT_AMBULATORY_CARE_PROVIDER_SITE_OTHER): Payer: Medicare Other | Admitting: Otolaryngology

## 2015-11-07 DIAGNOSIS — H903 Sensorineural hearing loss, bilateral: Secondary | ICD-10-CM

## 2015-11-07 DIAGNOSIS — H60333 Swimmer's ear, bilateral: Secondary | ICD-10-CM | POA: Diagnosis not present

## 2015-11-10 ENCOUNTER — Other Ambulatory Visit: Payer: Medicare Other

## 2015-11-10 ENCOUNTER — Ambulatory Visit: Payer: Medicare Other

## 2015-11-11 ENCOUNTER — Encounter (HOSPITAL_COMMUNITY): Payer: Self-pay

## 2015-11-11 ENCOUNTER — Emergency Department (HOSPITAL_COMMUNITY): Payer: Medicare Other

## 2015-11-11 ENCOUNTER — Emergency Department (HOSPITAL_COMMUNITY)
Admission: EM | Admit: 2015-11-11 | Discharge: 2015-11-11 | Disposition: A | Discharge status: Home / Self Care | Payer: Medicare Other | Attending: Emergency Medicine | Admitting: Emergency Medicine

## 2015-11-11 DIAGNOSIS — Z79899 Other long term (current) drug therapy: Secondary | ICD-10-CM | POA: Diagnosis not present

## 2015-11-11 DIAGNOSIS — Y9289 Other specified places as the place of occurrence of the external cause: Secondary | ICD-10-CM | POA: Insufficient documentation

## 2015-11-11 DIAGNOSIS — T148XXA Other injury of unspecified body region, initial encounter: Secondary | ICD-10-CM

## 2015-11-11 DIAGNOSIS — S8011XA Contusion of right lower leg, initial encounter: Secondary | ICD-10-CM | POA: Diagnosis not present

## 2015-11-11 DIAGNOSIS — Y999 Unspecified external cause status: Secondary | ICD-10-CM | POA: Insufficient documentation

## 2015-11-11 DIAGNOSIS — E039 Hypothyroidism, unspecified: Secondary | ICD-10-CM | POA: Insufficient documentation

## 2015-11-11 DIAGNOSIS — Z853 Personal history of malignant neoplasm of breast: Secondary | ICD-10-CM | POA: Diagnosis not present

## 2015-11-11 DIAGNOSIS — S8001XA Contusion of right knee, initial encounter: Secondary | ICD-10-CM | POA: Diagnosis not present

## 2015-11-11 DIAGNOSIS — W1839XA Other fall on same level, initial encounter: Secondary | ICD-10-CM | POA: Insufficient documentation

## 2015-11-11 DIAGNOSIS — M25561 Pain in right knee: Secondary | ICD-10-CM | POA: Diagnosis not present

## 2015-11-11 DIAGNOSIS — Y939 Activity, unspecified: Secondary | ICD-10-CM | POA: Insufficient documentation

## 2015-11-11 DIAGNOSIS — I10 Essential (primary) hypertension: Secondary | ICD-10-CM | POA: Diagnosis not present

## 2015-11-11 NOTE — ED Triage Notes (Signed)
Pt got up in the night.  Fell striking rt knee.  No head injury or LOC.  Not sure what made her fall.  Rt knee is swollen and can put weight on leg but hurts "a lot"

## 2015-11-11 NOTE — ED Provider Notes (Signed)
[place] DEPT Provider Note   CSN: [id number] Arrival date & time: 11/11/15  1148  First Provider Contact:  1405 p.m.      History   Chief Complaint Chief Complaint  Patient presents with  . Fall  . Knee Pain    HPI [name] is a 75 y.o. female.  HPI she states that she got up during the night and fell in her hallway onto a flexed right knee. Had pain and swelling of the knee. She is able to ambulate. However she became concerned because the amount of swelling and discoloration. She presents for evaluation.  Past Medical History:  Diagnosis Date  . Anemia   . Anxiety   . Arthritis   . Atrophic vaginitis   . Cancer [facility])    breast cancer  . Endometriosis   . Family history of breast cancer   . Family history of colon cancer   . GERD (gastroesophageal reflux disease)   . Headache(784.0)   . Heart murmur    as a child only  . Hyperlipidemia   . Hypertension   . Hypothyroidism   . Kidney stones   . Osteopenia   . Osteoporosis   . Rheumatic fever    age 46  . Sleep apnea    " very mild" does not wear CPAP  . Thyroid disease    Hypothyroid  . Ulcer   . UTI (lower urinary tract infection)     Patient Active Problem List   Diagnosis Date Noted  . Genetic testing 09/23/2015  . Breast cancer of upper-outer quadrant of left female breast ([place]) 09/07/2015  . Family history of breast cancer   . Family history of colon cancer   . Prediabetes 08/30/2014  . Endometriosis   . Osteoporosis   . Hypertension   . UTI (lower urinary tract infection) 03/05/2011  . Dysuria 03/05/2011  . Hypothyroidism 06/06/2009  . FATIGUE 06/06/2009  . ELEVATED BLOOD PRESSURE 06/06/2009  . OSTEOPENIA 09/09/2008  . SCOLIOSIS 09/09/2008  . Hyperlipidemia 07/30/2008  . MIGRAINE HEADACHE 07/30/2008  . GERD 07/30/2008  . ENTHESOPATHY OF HIP REGION 07/30/2008  . HEADACHE 07/30/2008    Past Surgical History:  Procedure Laterality Date  . BREAST LUMPECTOMY WITH  RADIOACTIVE SEED AND SENTINEL LYMPH NODE BIOPSY Left 09/09/2015   Procedure: LEFT BREAST LUMPECTOMY WITH RADIOACTIVE SEED AND SENTINEL LYMPH NODE BIOPSY;  Surgeon: Jackolyn Confer, MD;  Location: [place];  Service: General;  Laterality: Left;  . LAPAROSCOPIC ENDOMETRIOSIS FULGURATION  1978  . PELVIC LAPAROSCOPY    . PORTACATH PLACEMENT N/A 10/04/2015   Procedure: INSERTION PORT-A-CATH WITH ULTRASOUND;  Surgeon: Jackolyn Confer, MD;  Location: [place];  Service: General;  Laterality: N/A;  . TONSILLECTOMY    . ULNAR NERVE REPAIR  2010    OB History    Gravida Para Term Preterm AB Living   0             SAB TAB Ectopic Multiple Live Births                   Home Medications    Prior to Admission medications   Medication Sig Start Date End Date Taking? Authorizing Provider  acetaminophen (TYLENOL) 500 MG tablet Take 1,000 mg by mouth every 6 (six) hours as needed (For pain.).    Historical Provider, MD  atenolol (TENORMIN) 50 MG tablet TAKE 1 TABLET (50 MG TOTAL) BY MOUTH DAILY. 08/05/15   [name], MD  chlorhexidine (PERIDEX) 0.12 %  solution Use as directed 15 mLs in the mouth or throat 2 (two) times daily.  08/30/15   Historical Provider, MD  ciprofloxacin-dexamethasone (CIPRODEX) otic suspension Place 4 drops into the left ear 2 (two) times daily. 11/06/15   Margette Fast, MD  dexamethasone (DECADRON) 4 MG tablet Take 2 tablets (8 mg total) by mouth 2 (two) times daily. Start the day before Taxotere. Then again the day after chemo for 3 days. 09/26/15   Chauncey Cruel, MD  hydrochlorothiazide (HYDRODIURIL) 25 MG tablet TAKE 1 TABLET (25 MG TOTAL) BY MOUTH DAILY. Patient taking differently: TAKE HALF A TABLET BY MOUTH DAILY. 09/19/15   [name], MD  HYDROcodone-acetaminophen (NORCO/VICODIN) 5-325 MG tablet Take 1-2 tablets by mouth every 4 (four) hours as needed for moderate pain or severe pain. 09/09/15   Jackolyn Confer, MD  levothyroxine (SYNTHROID, LEVOTHROID) 50 MCG tablet  TAKE 1 TABLET BY MOUTH DAILY BEFORE BREAKFAST 08/05/15   [name], MD  lidocaine-prilocaine (EMLA) cream Apply to affected area once Patient taking differently: Apply 1 application topically as needed (For port-a-cath.).  09/26/15   Chauncey Cruel, MD  LORazepam (ATIVAN) 0.5 MG tablet Take 1 tablet (0.5 mg total) by mouth every 8 (eight) hours. 10/26/15   Chauncey Cruel, MD  Multiple Vitamin (MULTIVITAMIN WITH MINERALS) TABS tablet Take 1 tablet by mouth daily.    Historical Provider, MD  Naphazoline HCl (CLEAR EYES OP) Place 2 drops into both eyes daily.    Historical Provider, MD  Omeprazole-Sodium Bicarbonate (ZEGERID) 20-1100 MG CAPS capsule Take 1 capsule by mouth daily before breakfast.    Historical Provider, MD  ondansetron (ZOFRAN) 4 MG tablet Take 1 tablet (4 mg total) by mouth every 4 (four) hours as needed for nausea. 09/09/15   Jackolyn Confer, MD  OVER THE COUNTER MEDICATION Take 475 mg by mouth daily. Tumeric 475mg     Historical Provider, MD  prochlorperazine (COMPAZINE) 10 MG tablet Take 1 tablet (10 mg total) by mouth every 6 (six) hours as needed (Nausea or vomiting). 09/26/15   Chauncey Cruel, MD  simvastatin (ZOCOR) 40 MG tablet TAKE 1 TABLET (40 MG TOTAL) BY MOUTH AT BEDTIME. 08/05/15   [name], MD  venlafaxine XR (EFFEXOR-XR) 150 MG 24 hr capsule Take 1 capsule (150 mg total) by mouth daily. 01/07/15   [name], MD  zolpidem (AMBIEN) 5 MG tablet Take one to two tablets qhs prn insomnia Patient not taking: Reported on 11/06/2015 11/04/15   [name], MD    Family History Family History  Problem Relation Age of Onset  . Breast cancer Mother     Age 60  . Hypertension Mother   . Heart disease Father   . Stroke Father   . Colon cancer Father     dx 63s  . Breast cancer Paternal Grandmother     Age 58  . Diabetes Paternal Grandmother   . Heart disease Maternal Grandmother     Social History Social History  Substance Use Topics  .  Smoking status: Never Smoker  . Smokeless tobacco: Never Used  . Alcohol use No     Allergies   Nitrofurantoin and Sulfamethoxazole-trimethoprim   Review of Systems Review of Systems  Constitutional: Negative for appetite change, chills, diaphoresis, fatigue and fever.  HENT: Negative for mouth sores, sore throat and trouble swallowing.   Eyes: Negative for visual disturbance.  Respiratory: Negative for cough, chest tightness, shortness of breath and wheezing.  Cardiovascular: Negative for chest pain.  Gastrointestinal: Negative for abdominal distention, abdominal pain, diarrhea, nausea and vomiting.  Endocrine: Negative for polydipsia, polyphagia and polyuria.  Genitourinary: Negative for dysuria, frequency and hematuria.  Musculoskeletal: Positive for arthralgias. Negative for gait problem.  Skin: Negative for color change, pallor and rash.  Neurological: Negative for dizziness, syncope, light-headedness and headaches.  Hematological: Does not bruise/bleed easily.  Psychiatric/Behavioral: Negative for behavioral problems and confusion.     Physical Exam Updated Vital Signs BP 124/55 (BP Location: Right Arm)   Pulse 64   Temp 97.8 F (36.6 C)   Resp 15   Ht 5\' 1"  (1.549 m)   Wt 145 lb (65.8 kg)   SpO2 100%   BMI 27.40 kg/m   Physical Exam  Constitutional: She is oriented to person, place, and time. She appears well-developed and well-nourished. No distress.  HENT:  Head: Normocephalic.  Eyes: Conjunctivae are normal. Pupils are equal, round, and reactive to light. No scleral icterus.  Neck: Normal range of motion. Neck supple. No thyromegaly present.  Cardiovascular: Normal rate and regular rhythm.  Exam reveals no gallop and no friction rub.   No murmur heard. Pulmonary/Chest: Effort normal and breath sounds normal. No respiratory distress. She has no wheezes. She has no rales.  Abdominal: Soft. Bowel sounds are normal. She exhibits no distension. There is no  tenderness. There is no rebound.  Musculoskeletal: Normal range of motion.       Legs: Neurological: She is alert and oriented to person, place, and time.  Skin: Skin is warm and dry. No rash noted.  Psychiatric: She has a normal mood and affect. Her behavior is normal.     ED Treatments / Results  Labs (all labs ordered are listed, but only abnormal results are displayed) Labs Reviewed - No data to display  EKG  EKG Interpretation None       Radiology Dg Knee Complete 4 Views Right  Result Date: 11/11/2015 CLINICAL DATA:  Acute right knee pain and swelling after fall last night. EXAM: RIGHT KNEE - COMPLETE 4+ VIEW COMPARISON:  None. FINDINGS: No evidence of fracture, dislocation, or joint effusion. No evidence of arthropathy or other focal bone abnormality. Swelling of prepatellar soft tissues is noted most consistent with contusion. IMPRESSION: Prepatellar soft tissue swelling is noted consistent with contusion or injury. No bony abnormality is noted. Electronically Signed   By: Marijo Conception, M.D.   On: 11/11/2015 12:48   Procedures Procedures (including critical care time)  Medications Ordered in ED Medications - No data to display   Initial Impression / Assessment and Plan / ED Course  I have reviewed the triage vital signs and the nursing notes.  Pertinent labs & imaging results that were available during my care of the patient were reviewed by me and considered in my medical decision making (see chart for details).  Clinical Course    Patient examined. Discharge instructions explained. Knee sleeve placed.  Final Clinical Impressions(s) / ED Diagnoses   Final diagnoses:  Knee pain, acute, right  Contusion    New Prescriptions New Prescriptions   No medications on file     Tanna Furry, MD 11/11/15 1424

## 2015-11-11 NOTE — Discharge Instructions (Signed)
Limit your ambulation on the knee until your swelling and pain improved.  Wear the knee sleeve to prevent additional swelling.  Apply ice for 20 minutes a time 3-4 times per day.

## 2015-11-12 ENCOUNTER — Ambulatory Visit: Payer: Medicare Other

## 2015-11-14 ENCOUNTER — Encounter: Payer: Self-pay | Admitting: Oncology

## 2015-11-14 ENCOUNTER — Ambulatory Visit (HOSPITAL_BASED_OUTPATIENT_CLINIC_OR_DEPARTMENT_OTHER): Payer: Medicare Other | Admitting: Oncology

## 2015-11-14 ENCOUNTER — Other Ambulatory Visit (HOSPITAL_BASED_OUTPATIENT_CLINIC_OR_DEPARTMENT_OTHER): Payer: Medicare Other

## 2015-11-14 ENCOUNTER — Ambulatory Visit (HOSPITAL_BASED_OUTPATIENT_CLINIC_OR_DEPARTMENT_OTHER): Payer: Medicare Other

## 2015-11-14 VITALS — BP 147/52 | HR 72 | Temp 97.6°F | Resp 17 | Ht 61.0 in | Wt 146.8 lb

## 2015-11-14 DIAGNOSIS — Z5189 Encounter for other specified aftercare: Secondary | ICD-10-CM

## 2015-11-14 DIAGNOSIS — C50412 Malignant neoplasm of upper-outer quadrant of left female breast: Secondary | ICD-10-CM

## 2015-11-14 DIAGNOSIS — Z170 Estrogen receptor positive status [ER+]: Secondary | ICD-10-CM | POA: Diagnosis not present

## 2015-11-14 DIAGNOSIS — Z5111 Encounter for antineoplastic chemotherapy: Secondary | ICD-10-CM | POA: Diagnosis not present

## 2015-11-14 LAB — CBC WITH DIFFERENTIAL/PLATELET
BASO%: 0.2 % (ref 0.0–2.0)
Basophils Absolute: 0 10*3/uL (ref 0.0–0.1)
EOS%: 0 % (ref 0.0–7.0)
Eosinophils Absolute: 0 10*3/uL (ref 0.0–0.5)
HCT: 38.3 % (ref 34.8–46.6)
HGB: 12.6 g/dL (ref 11.6–15.9)
LYMPH%: 5 % — ABNORMAL LOW (ref 14.0–49.7)
MCH: 28.3 pg (ref 25.1–34.0)
MCHC: 32.9 g/dL (ref 31.5–36.0)
MCV: 86.1 fL (ref 79.5–101.0)
MONO#: 0.5 10*3/uL (ref 0.1–0.9)
MONO%: 3.2 % (ref 0.0–14.0)
NEUT#: 13.7 10*3/uL — ABNORMAL HIGH (ref 1.5–6.5)
NEUT%: 91.6 % — ABNORMAL HIGH (ref 38.4–76.8)
Platelets: 399 10*3/uL (ref 145–400)
RBC: 4.45 10*6/uL (ref 3.70–5.45)
RDW: 14.6 % — ABNORMAL HIGH (ref 11.2–14.5)
WBC: 14.9 10*3/uL — ABNORMAL HIGH (ref 3.9–10.3)
lymph#: 0.7 10*3/uL — ABNORMAL LOW (ref 0.9–3.3)

## 2015-11-14 LAB — COMPREHENSIVE METABOLIC PANEL
ALT: 21 U/L (ref 0–55)
AST: 16 U/L (ref 5–34)
Albumin: 4.1 g/dL (ref 3.5–5.0)
Alkaline Phosphatase: 81 U/L (ref 40–150)
Anion Gap: 15 mEq/L — ABNORMAL HIGH (ref 3–11)
BUN: 25.8 mg/dL (ref 7.0–26.0)
CO2: 21 mEq/L — ABNORMAL LOW (ref 22–29)
Calcium: 10.1 mg/dL (ref 8.4–10.4)
Chloride: 104 mEq/L (ref 98–109)
Creatinine: 1.1 mg/dL (ref 0.6–1.1)
EGFR: 48 mL/min/{1.73_m2} — ABNORMAL LOW (ref >90)
Glucose: 146 mg/dl — ABNORMAL HIGH (ref 70–140)
Potassium: 4.3 mEq/L (ref 3.5–5.1)
Sodium: 139 mEq/L (ref 136–145)
Total Bilirubin: 0.55 mg/dL (ref 0.20–1.20)
Total Protein: 7.4 g/dL (ref 6.4–8.3)

## 2015-11-14 MED ORDER — SODIUM CHLORIDE 0.9 % IV SOLN
Freq: Once | INTRAVENOUS | Status: AC
  Administered 2015-11-14: 10:00:00 via INTRAVENOUS

## 2015-11-14 MED ORDER — PALONOSETRON HCL INJECTION 0.25 MG/5ML
0.2500 mg | Freq: Once | INTRAVENOUS | Status: AC
  Administered 2015-11-14: 0.25 mg via INTRAVENOUS

## 2015-11-14 MED ORDER — SODIUM CHLORIDE 0.9 % IV SOLN
600.0000 mg/m2 | Freq: Once | INTRAVENOUS | Status: AC
  Administered 2015-11-14: 1020 mg via INTRAVENOUS
  Filled 2015-11-14: qty 51

## 2015-11-14 MED ORDER — PEGFILGRASTIM 6 MG/0.6ML ~~LOC~~ PSKT
6.0000 mg | PREFILLED_SYRINGE | Freq: Once | SUBCUTANEOUS | Status: AC
  Administered 2015-11-14: 6 mg via SUBCUTANEOUS
  Filled 2015-11-14: qty 0.6

## 2015-11-14 MED ORDER — FAMOTIDINE IN NACL 20-0.9 MG/50ML-% IV SOLN
INTRAVENOUS | Status: AC
  Filled 2015-11-14: qty 50

## 2015-11-14 MED ORDER — PALONOSETRON HCL INJECTION 0.25 MG/5ML
INTRAVENOUS | Status: AC
  Filled 2015-11-14: qty 5

## 2015-11-14 MED ORDER — FAMOTIDINE IN NACL 20-0.9 MG/50ML-% IV SOLN
20.0000 mg | Freq: Once | INTRAVENOUS | Status: AC
  Administered 2015-11-14: 20 mg via INTRAVENOUS

## 2015-11-14 MED ORDER — SODIUM CHLORIDE 0.9% FLUSH
10.0000 mL | INTRAVENOUS | Status: DC | PRN
  Administered 2015-11-14: 10 mL
  Filled 2015-11-14: qty 10

## 2015-11-14 MED ORDER — SODIUM CHLORIDE 0.9 % IV SOLN
75.0000 mg/m2 | Freq: Once | INTRAVENOUS | Status: DC
  Filled 2015-11-14: qty 13

## 2015-11-14 MED ORDER — SODIUM CHLORIDE 0.9 % IV SOLN
10.0000 mg | Freq: Once | INTRAVENOUS | Status: AC
  Administered 2015-11-14: 10 mg via INTRAVENOUS
  Filled 2015-11-14: qty 1

## 2015-11-14 MED ORDER — HEPARIN SOD (PORK) LOCK FLUSH 100 UNIT/ML IV SOLN
500.0000 [IU] | Freq: Once | INTRAVENOUS | Status: AC | PRN
  Administered 2015-11-14: 500 [IU]
  Filled 2015-11-14: qty 5

## 2015-11-14 NOTE — Patient Instructions (Signed)
[facility] Discharge Instructions for Patients Receiving Chemotherapy  Today you received the following chemotherapy agents: Taxotere and Cytoxan   To help prevent nausea and vomiting after your treatment, we encourage you to take your nausea medication as directed.  Take Compazine 10 mg total by mouth every 6 hours as needed. DO NOT take zofran for 3 days. Starting Thursday you may start Zofran 4 mg total by mouth every 4 hours as needed for nausea.   If you develop nausea and vomiting that is not controlled by your nausea medication, call the clinic.   BELOW ARE SYMPTOMS THAT SHOULD BE REPORTED IMMEDIATELY:  *FEVER GREATER THAN 100.5 F  *CHILLS WITH OR WITHOUT FEVER  NAUSEA AND VOMITING THAT IS NOT CONTROLLED WITH YOUR NAUSEA MEDICATION  *UNUSUAL SHORTNESS OF BREATH  *UNUSUAL BRUISING OR BLEEDING  TENDERNESS IN MOUTH AND THROAT WITH OR WITHOUT PRESENCE OF ULCERS  *URINARY PROBLEMS  *BOWEL PROBLEMS  UNUSUAL RASH Items with * indicate a potential emergency and should be followed up as soon as possible.  Feel free to call the clinic you have any questions or concerns. The clinic phone number is [phone].  Please show the [facility] at check-in to the Emergency Department and triage nurse.   Docetaxel injection What is this medicine? DOCETAXEL (doe se TAX el) is a chemotherapy drug. It targets fast dividing cells, like cancer cells, and causes these cells to die. This medicine is used to treat many types of cancers like breast cancer, certain stomach cancers, head and neck cancer, lung cancer, and prostate cancer. This medicine may be used for other purposes; ask your health care provider or pharmacist if you have questions. What should I tell my health care provider before I take this medicine? They need to know if you have any of these conditions: -infection (especially a virus infection such as chickenpox, cold sores, or herpes) -liver  disease -low blood counts, like low white cell, platelet, or red cell counts -an unusual or allergic reaction to docetaxel, polysorbate 80, other chemotherapy agents, other medicines, foods, dyes, or preservatives -pregnant or trying to get pregnant -breast-feeding How should I use this medicine? This drug is given as an infusion into a vein. It is administered in a hospital or clinic by a specially trained health care professional. Talk to your pediatrician regarding the use of this medicine in children. Special care may be needed. Overdosage: If you think you have taken too much of this medicine contact a poison control center or emergency room at once. NOTE: This medicine is only for you. Do not share this medicine with others. What if I miss a dose? It is important not to miss your dose. Call your doctor or health care professional if you are unable to keep an appointment. What may interact with this medicine? -cyclosporine -erythromycin -ketoconazole -medicines to increase blood counts like filgrastim, pegfilgrastim, sargramostim -vaccines Talk to your doctor or health care professional before taking any of these medicines: -acetaminophen -aspirin -ibuprofen -ketoprofen -naproxen This list may not describe all possible interactions. Give your health care provider a list of all the medicines, herbs, non-prescription drugs, or dietary supplements you use. Also tell them if you smoke, drink alcohol, or use illegal drugs. Some items may interact with your medicine. What should I watch for while using this medicine? Your condition will be monitored carefully while you are receiving this medicine. You will need important blood work done while you are taking this medicine. This drug  may make you feel generally unwell. This is not uncommon, as chemotherapy can affect healthy cells as well as cancer cells. Report any side effects. Continue your course of treatment even though you feel ill  unless your doctor tells you to stop. In some cases, you may be given additional medicines to help with side effects. Follow all directions for their use. Call your doctor or health care professional for advice if you get a fever, chills or sore throat, or other symptoms of a cold or flu. Do not treat yourself. This drug decreases your body's ability to fight infections. Try to avoid being around people who are sick. This medicine may increase your risk to bruise or bleed. Call your doctor or health care professional if you notice any unusual bleeding. This medicine may contain alcohol in the product. You may get drowsy or dizzy. Do not drive, use machinery, or do anything that needs mental alertness until you know how this medicine affects you. Do not stand or sit up quickly, especially if you are an older patient. This reduces the risk of dizzy or fainting spells. Avoid alcoholic drinks. Do not become pregnant while taking this medicine. Women should inform their doctor if they wish to become pregnant or think they might be pregnant. There is a potential for serious side effects to an unborn child. Talk to your health care professional or pharmacist for more information. Do not breast-feed an infant while taking this medicine. What side effects may I notice from receiving this medicine? Side effects that you should report to your doctor or health care professional as soon as possible: -allergic reactions like skin rash, itching or hives, swelling of the face, lips, or tongue -low blood counts - This drug may decrease the number of white blood cells, red blood cells and platelets. You may be at increased risk for infections and bleeding. -signs of infection - fever or chills, cough, sore throat, pain or difficulty passing urine -signs of decreased platelets or bleeding - bruising, pinpoint red spots on the skin, black, tarry stools, nosebleeds -signs of decreased red blood cells - unusually weak or  tired, fainting spells, lightheadedness -breathing problems -fast or irregular heartbeat -low blood pressure -mouth sores -nausea and vomiting -pain, swelling, redness or irritation at the injection site -pain, tingling, numbness in the hands or feet -swelling of the ankle, feet, hands -weight gain Side effects that usually do not require medical attention (report to your prescriber or health care professional if they continue or are bothersome): -bone pain -complete hair loss including hair on your head, underarms, pubic hair, eyebrows, and eyelashes -diarrhea -excessive tearing -changes in the color of fingernails -loosening of the fingernails -nausea -muscle pain -red flush to skin -sweating -weak or tired This list may not describe all possible side effects. Call your doctor for medical advice about side effects. You may report side effects to FDA at 1-800-FDA-1088. Where should I keep my medicine? This drug is given in a hospital or clinic and will not be stored at home. NOTE: This sheet is a summary. It may not cover all possible information. If you have questions about this medicine, talk to your doctor, pharmacist, or health care provider.    2016, [employer]. (2014-04-19 16:04:57)   Cyclophosphamide injection What is this medicine? CYCLOPHOSPHAMIDE (sye kloe FOSS fa mide) is a chemotherapy drug. It slows the growth of cancer cells. This medicine is used to treat many types of cancer like lymphoma, myeloma, leukemia, breast cancer, and  ovarian cancer, to name a few. This medicine may be used for other purposes; ask your health care provider or pharmacist if you have questions. What should I tell my health care provider before I take this medicine? They need to know if you have any of these conditions: -blood disorders -history of other chemotherapy -infection -kidney disease -liver disease -recent or ongoing radiation therapy -tumors in the bone  marrow -an unusual or allergic reaction to cyclophosphamide, other chemotherapy, other medicines, foods, dyes, or preservatives -pregnant or trying to get pregnant -breast-feeding How should I use this medicine? This drug is usually given as an injection into a vein or muscle or by infusion into a vein. It is administered in a hospital or clinic by a specially trained health care professional. Talk to your pediatrician regarding the use of this medicine in children. Special care may be needed. Overdosage: If you think you have taken too much of this medicine contact a poison control center or emergency room at once. NOTE: This medicine is only for you. Do not share this medicine with others. What if I miss a dose? It is important not to miss your dose. Call your doctor or health care professional if you are unable to keep an appointment. What may interact with this medicine? This medicine may interact with the following medications: -amiodarone -amphotericin B -azathioprine -certain antiviral medicines for HIV or AIDS such as protease inhibitors (e.g., indinavir, ritonavir) and zidovudine -certain blood pressure medications such as benazepril, captopril, enalapril, fosinopril, lisinopril, moexipril, monopril, perindopril, quinapril, ramipril, trandolapril -certain cancer medications such as anthracyclines (e.g., daunorubicin, doxorubicin), busulfan, cytarabine, paclitaxel, pentostatin, tamoxifen, trastuzumab -certain diuretics such as chlorothiazide, chlorthalidone, hydrochlorothiazide, indapamide, metolazone -certain medicines that treat or prevent blood clots like warfarin -certain muscle relaxants such as succinylcholine -cyclosporine -etanercept -indomethacin -medicines to increase blood counts like filgrastim, pegfilgrastim, sargramostim -medicines used as general anesthesia -metronidazole -natalizumab This list may not describe all possible interactions. Give your health care  provider a list of all the medicines, herbs, non-prescription drugs, or dietary supplements you use. Also tell them if you smoke, drink alcohol, or use illegal drugs. Some items may interact with your medicine. What should I watch for while using this medicine? Visit your doctor for checks on your progress. This drug may make you feel generally unwell. This is not uncommon, as chemotherapy can affect healthy cells as well as cancer cells. Report any side effects. Continue your course of treatment even though you feel ill unless your doctor tells you to stop. Drink water or other fluids as directed. Urinate often, even at night. In some cases, you may be given additional medicines to help with side effects. Follow all directions for their use. Call your doctor or health care professional for advice if you get a fever, chills or sore throat, or other symptoms of a cold or flu. Do not treat yourself. This drug decreases your body's ability to fight infections. Try to avoid being around people who are sick. This medicine may increase your risk to bruise or bleed. Call your doctor or health care professional if you notice any unusual bleeding. Be careful brushing and flossing your teeth or using a toothpick because you may get an infection or bleed more easily. If you have any dental work done, tell your dentist you are receiving this medicine. You may get drowsy or dizzy. Do not drive, use machinery, or do anything that needs mental alertness until you know how this medicine affects you. Do not  become pregnant while taking this medicine or for 1 year after stopping it. Women should inform their doctor if they wish to become pregnant or think they might be pregnant. Men should not father a child while taking this medicine and for 4 months after stopping it. There is a potential for serious side effects to an unborn child. Talk to your health care professional or pharmacist for more information. Do not  breast-feed an infant while taking this medicine. This medicine may interfere with the ability to have a child. This medicine has caused ovarian failure in some women. This medicine has caused reduced sperm counts in some men. You should talk with your doctor or health care professional if you are concerned about your fertility. If you are going to have surgery, tell your doctor or health care professional that you have taken this medicine. What side effects may I notice from receiving this medicine? Side effects that you should report to your doctor or health care professional as soon as possible: -allergic reactions like skin rash, itching or hives, swelling of the face, lips, or tongue -low blood counts - this medicine may decrease the number of white blood cells, red blood cells and platelets. You may be at increased risk for infections and bleeding. -signs of infection - fever or chills, cough, sore throat, pain or difficulty passing urine -signs of decreased platelets or bleeding - bruising, pinpoint red spots on the skin, black, tarry stools, blood in the urine -signs of decreased red blood cells - unusually weak or tired, fainting spells, lightheadedness -breathing problems -dark urine -dizziness -palpitations -swelling of the ankles, feet, hands -trouble passing urine or change in the amount of urine -weight gain -yellowing of the eyes or skin Side effects that usually do not require medical attention (report to your doctor or health care professional if they continue or are bothersome): -changes in nail or skin color -hair loss -missed menstrual periods -mouth sores -nausea, vomiting This list may not describe all possible side effects. Call your doctor for medical advice about side effects. You may report side effects to FDA at 1-800-FDA-1088. Where should I keep my medicine? This drug is given in a hospital or clinic and will not be stored at home. NOTE: This sheet is a  summary. It may not cover all possible information. If you have questions about this medicine, talk to your doctor, pharmacist, or health care provider.    2016, [employer]. (2012-02-15 16:22:58)

## 2015-11-14 NOTE — Progress Notes (Signed)
[place]  [phone] [phone]     ID: [name] DOB: January 13, 1941  MR#: [id number]  FYT#:[id number]  Patient Care Team: [name], MD as PCP - General Chauncey Cruel, MD as Consulting Physician (Oncology) Jackolyn Confer, MD as Consulting Physician (General Surgery) Eppie Gibson, MD as Attending Physician (Radiation Oncology) Richmond Campbell, MD as Consulting Physician (Gastroenterology) Lavonna Monarch, MD as Consulting Physician (Dermatology) PCP: [name], MD OTHER MD:  CHIEF COMPLAINT: Estrogen receptor positive breast cancer  CURRENT TREATMENT: Adjuvant chemotherapy with cyclophosphamide and docetaxel   BREAST CANCER HISTORY: From the original intake note:  [name] had bilateral screening mammography with tomography scheduled routinely 08/01/2015 at the [facility]. This showed a possible mass in the left breast. Accordingly on 08/15/2015 she underwent left diagnostic mammography with tomography and ultrasonography. This found the breast density to be category be. On spot compression views and was a 0.9 cm spiculated mass in the upper outer left breast, which on ultrasound was at 2:30 o'clock 5 cm from the nipple and measured 0.8 cm. Ultrasound of the axilla was benign.  Biopsy of the left breast mass in question 08/19/2015 found ([id number]) an invasive ductal carcinoma (E-cadherin positive) which was estrogen receptor 100% positive, progesterone receptor 100% positive, both with strong staining intensity, with an MIB-1 of 10%, and no HER-2 amplification, the signals ratio being 1.23 and the number per cell 1.85.  Her subsequent history is as detailed below  INTERVAL HISTORY: [name] returns today for follow-up of her estrogen receptor positive breast cancer ompanied by her husband [name]. Today is day 1 cycle 2 of 4 planned cycles of cyclophosphamide and docetaxel given 21 days apart.   Feeling well today. Fatigue is  better. No nausea or vomiting. Was seen in the ER last week due to a right knee contusion following a fall. No fracture noted. Tried to get a cane, but the medical supply store ordered the wrong one. Plan to try to get another soon.   REVIEW OF SYSTEMS: [name] feels well today. She denies fevers, chills, abdominal pain, nausea, vomiting. Bowels are moving well. Denies issues with constipation. Denies neuropathy. A detailed review of systems today was otherwise stable  PAST MEDICAL HISTORY: Past Medical History:  Diagnosis Date  . Anemia   . Anxiety   . Arthritis   . Atrophic vaginitis   . Cancer [facility])    breast cancer  . Endometriosis   . Family history of breast cancer   . Family history of colon cancer   . GERD (gastroesophageal reflux disease)   . Headache(784.0)   . Heart murmur    as a child only  . Hyperlipidemia   . Hypertension   . Hypothyroidism   . Kidney stones   . Osteopenia   . Osteoporosis   . Rheumatic fever    age 23  . Sleep apnea    " very mild" does not wear CPAP  . Thyroid disease    Hypothyroid  . Ulcer   . UTI (lower urinary tract infection)     PAST SURGICAL HISTORY: Past Surgical History:  Procedure Laterality Date  . BREAST LUMPECTOMY WITH RADIOACTIVE SEED AND SENTINEL LYMPH NODE BIOPSY Left 09/09/2015   Procedure: LEFT BREAST LUMPECTOMY WITH RADIOACTIVE SEED AND SENTINEL LYMPH NODE BIOPSY;  Surgeon: Jackolyn Confer, MD;  Location: [place];  Service: General;  Laterality: Left;  . LAPAROSCOPIC ENDOMETRIOSIS FULGURATION  1978  . PELVIC LAPAROSCOPY    . PORTACATH PLACEMENT N/A  10/04/2015   Procedure: INSERTION PORT-A-CATH WITH ULTRASOUND;  Surgeon: Jackolyn Confer, MD;  Location: [place];  Service: General;  Laterality: N/A;  . TONSILLECTOMY    . ULNAR NERVE REPAIR  2010    FAMILY HISTORY Family History  Problem Relation Age of Onset  . Breast cancer Mother     Age 55  . Hypertension Mother   . Heart disease Father   . Stroke Father   . Colon  cancer Father     dx 11s  . Breast cancer Paternal Grandmother     Age 50  . Diabetes Paternal Grandmother   . Heart disease Maternal Grandmother   The patient's father died at age 22 the patient's mother was diagnosed with breast cancer at age 21 and again at age 32, which was the year of her death. In addition a paternal grandmother was diagnosed with breast cancer in her 64s. The patient's father had colon cancer. The patient had 2 brothers, no sisters. There is no history of ovarian cancer in the family.  GYNECOLOGIC HISTORY:  No LMP recorded. Patient is postmenopausal. Menarche age 76. The patient is GX P0. She went through menopause in her late 59s. She did not take hormone replacement. She never used oral contraceptives.  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 Her husband [name]") is retired from [employer]. They have an adopted son, [name], who is completing a second Engineer, production degree at [employer] state area he carries a diagnosis of schizophrenia, but is obviously very functional. The patient has no grandchildren. She is a Chief of Staff    ADVANCED DIRECTIVES:  NOT IN PLACE   HEALTH MAINTENANCE: Social History  Substance Use Topics  . Smoking status: Never Smoker  . Smokeless tobacco: Never Used  . Alcohol use No     Colonoscopy: 2012/[facility]   PAP: 2013?   Bone density: 2012/osteopenia   Lipid panel:  Allergies  Allergen Reactions  . Nitrofurantoin Nausea And Vomiting    GI upset  . Sulfamethoxazole-Trimethoprim Nausea And Vomiting and Other (See Comments)    GI upset    Current Outpatient Prescriptions  Medication Sig Dispense Refill  . acetaminophen (TYLENOL) 500 MG tablet Take 1,000 mg by mouth every 6 (six) hours as needed (For pain.).    Marland Kitchen atenolol (TENORMIN) 50 MG tablet TAKE 1 TABLET (50 MG TOTAL) BY MOUTH DAILY. 30 tablet 5  . chlorhexidine (PERIDEX) 0.12 % solution Use as directed 15 mLs in the mouth or throat 2 (two)  times daily.     . ciprofloxacin-dexamethasone (CIPRODEX) otic suspension Place 4 drops into the left ear 2 (two) times daily. 7.5 mL 0  . dexamethasone (DECADRON) 4 MG tablet Take 2 tablets (8 mg total) by mouth 2 (two) times daily. Start the day before Taxotere. Then again the day after chemo for 3 days. 30 tablet 1  . hydrochlorothiazide (HYDRODIURIL) 25 MG tablet TAKE 1 TABLET (25 MG TOTAL) BY MOUTH DAILY. (Patient taking differently: TAKE HALF A TABLET BY MOUTH DAILY.) 30 tablet 11  . HYDROcodone-acetaminophen (NORCO/VICODIN) 5-325 MG tablet Take 1-2 tablets by mouth every 4 (four) hours as needed for moderate pain or severe pain. 30 tablet 0  . levothyroxine (SYNTHROID, LEVOTHROID) 50 MCG tablet TAKE 1 TABLET BY MOUTH DAILY BEFORE BREAKFAST 30 tablet 5  . lidocaine-prilocaine (EMLA) cream Apply to affected area once (Patient taking differently: Apply 1 application topically as needed (For port-a-cath.). ) 30 g 3  . LORazepam (ATIVAN) 0.5 MG tablet Take  1 tablet (0.5 mg total) by mouth every 8 (eight) hours. 30 tablet 2  . Multiple Vitamin (MULTIVITAMIN WITH MINERALS) TABS tablet Take 1 tablet by mouth daily.    . Naphazoline HCl (CLEAR EYES OP) Place 2 drops into both eyes daily.    Earney Navy Bicarbonate (ZEGERID) 20-1100 MG CAPS capsule Take 1 capsule by mouth daily before breakfast.    . ondansetron (ZOFRAN) 4 MG tablet Take 1 tablet (4 mg total) by mouth every 4 (four) hours as needed for nausea. 10 tablet 0  . OVER THE COUNTER MEDICATION Take 475 mg by mouth daily. Tumeric 426m    . prochlorperazine (COMPAZINE) 10 MG tablet Take 1 tablet (10 mg total) by mouth every 6 (six) hours as needed (Nausea or vomiting). 30 tablet 1  . simvastatin (ZOCOR) 40 MG tablet TAKE 1 TABLET (40 MG TOTAL) BY MOUTH AT BEDTIME. 30 tablet 5  . venlafaxine XR (EFFEXOR-XR) 150 MG 24 hr capsule Take 1 capsule (150 mg total) by mouth daily. 90 capsule 3  . zolpidem (AMBIEN) 5 MG tablet Take one to two  tablets qhs prn insomnia (Patient not taking: Reported on 11/06/2015) 60 tablet 2   No current facility-administered medications for this visit.     OBJECTIVE: Middle-aged white womanIn in no acute distress  Vitals:   11/14/15 0917  BP: (!) 147/52  Pulse: 72  Resp: 17  Temp: 97.6 F (36.4 C)     Body mass index is 27.74 kg/m.    ECOG FS:1 - Symptomatic but completely ambulatory  Sclerae unicteric, pupils round and equal Oropharynx clear and slightly dry No cervical or supraclavicular adenopathy Lungs no rales or rhonchi Heart regular rate and rhythm Abd soft, nontender, positive bowel sounds MSK no focal spinal tenderness, no upper extremity lymphedema Neuro: nonfocal, well oriented, appropriate affect Breasts: Deferred   Skin: The port in the right upper anterior chest is intact.  LAB RESULTS:  CMP     Component Value Date/Time   NA 136 10/31/2015 0833   K 4.1 10/31/2015 0833   CL 109 10/04/2015 0925   CO2 22 10/31/2015 0833   GLUCOSE 146 (H) 10/31/2015 0833   BUN 13.7 10/31/2015 0833   CREATININE 0.9 10/31/2015 0833   CALCIUM 9.1 10/31/2015 0833   PROT 6.7 10/31/2015 0833   ALBUMIN 3.5 10/31/2015 0833   AST 19 10/31/2015 0833   ALT 18 10/31/2015 0833   ALKPHOS 93 10/31/2015 0833   BILITOT 0.60 10/31/2015 0833   GFRNONAA >60 10/04/2015 0925   GFRAA >60 10/04/2015 0925    INo results found for: SPEP, UPEP  Lab Results  Component Value Date   WBC 14.9 (H) 11/14/2015   NEUTROABS 13.7 (H) 11/14/2015   HGB 12.6 11/14/2015   HCT 38.3 11/14/2015   MCV 86.1 11/14/2015   PLT 399 11/14/2015      Chemistry      Component Value Date/Time   NA 136 10/31/2015 0833   K 4.1 10/31/2015 0833   CL 109 10/04/2015 0925   CO2 22 10/31/2015 0833   BUN 13.7 10/31/2015 0833   CREATININE 0.9 10/31/2015 0833      Component Value Date/Time   CALCIUM 9.1 10/31/2015 0833   ALKPHOS 93 10/31/2015 0833   AST 19 10/31/2015 0833   ALT 18 10/31/2015 0833   BILITOT 0.60  10/31/2015 0833       No results found for: LABCA2  No components found for: [id number] No results for input(s): INR in the last  168 hours.  Urinalysis    Component Value Date/Time   BILIRUBINUR 1+ 09/28/2014 1124   PROTEINUR 2+ 09/28/2014 1124   UROBILINOGEN 1.0 09/28/2014 1124   NITRITE positive 09/28/2014 1124   LEUKOCYTESUR large (3+) (A) 09/28/2014 1124      ELIGIBLE FOR AVAILABLE RESEARCH PROTOCOL: no  STUDIES: Dg Knee Complete 4 Views Right  Result Date: 11/11/2015 CLINICAL DATA:  Acute right knee pain and swelling after fall last night. EXAM: RIGHT KNEE - COMPLETE 4+ VIEW COMPARISON:  None. FINDINGS: No evidence of fracture, dislocation, or joint effusion. No evidence of arthropathy or other focal bone abnormality. Swelling of prepatellar soft tissues is noted most consistent with contusion. IMPRESSION: Prepatellar soft tissue swelling is noted consistent with contusion or injury. No bony abnormality is noted. Electronically Signed   By: Marijo Conception, M.D.   On: 11/11/2015 12:48   ASSESSMENT: 75 y.o. [place] woman status post left breast upper outer quadrant biopsy 08/19/2015 for a clinical T1b N0, stage IA invasive ductal carcinoma, strongly estrogen and progesterone receptor positive, HER-2 not amplified, with an MIB-1 of 10%  (1) left lumpectomy and sentinel lymph node sampling 09/09/2015 showed apT1b pN0, stage IA invasive ductal carcinoma, grade 1, repeat HER-2 again negative.   (2) Oncotype DX score of 29 predicts a 19% risk of recurrence outside the breast within the next 10 years if the patient's only systemic therapy is tamoxifen for 5 years. It also predicts a further risk reduction with chemotherapy of 8%.  (3) adjuvant chemotherapy will consist of cyclophosphamide and docetaxel every 21 days 4, started 10/24/2015  (4) adjuvant radiation to follow chemotherapy  (5) anti-estrogen therapy to follow at the completion of local treatment  (6) genetics  testing 09/19/2015 through the Breast/Ovarian gene panel offered by [facility] found no deleterious mutations in ATM, BARD1, BRCA1, BRCA2, BRIP1, CDH1, CHEK2, EPCAM, FANCC, MLH1, MSH2, MSH6, NBN, PALB2, PMS2, PTEN, RAD51C, RAD51D, TP53, and XRCC2.  PLAN: [name] is doing well today and we will proceed with cycle 2 day 1 of her cyclophosphamide and docetaxel. Reviewed with her that she does need to hydrate herself more following her chemotherapy. I suggested she measure about a quart of water every morning and make sure to drink that by noon. Reviewed that we recommend that she stretch out her Decadron so that beginning on day 5, she will take a half a tablet every morning for 3 days. That should help with the fatigue issue.  She will return next week for a follow-up visit and lab work. She knows to call our office in the interim for any questions or concerns.    Mikey Bussing, NP   11/14/2015 9:18 AM Medical Oncology and Hematology [facility] [address] [place], [place] [address] Tel. [phone]    Fax. [phone]

## 2015-11-16 ENCOUNTER — Ambulatory Visit: Payer: Medicare Other

## 2015-11-20 NOTE — Progress Notes (Signed)
[place]  [phone] Fax:(336) [phone]     ID: [name] DOB: 14-Jun-1940  MR#: [id number]  DZH#:[id number]  Patient Care Team: [name], MD as PCP - General Chauncey Cruel, MD as Consulting Physician (Oncology) Jackolyn Confer, MD as Consulting Physician (General Surgery) Eppie Gibson, MD as Attending Physician (Radiation Oncology) Richmond Campbell, MD as Consulting Physician (Gastroenterology) Lavonna Monarch, MD as Consulting Physician (Dermatology) PCP: [name], MD OTHER MD:  CHIEF COMPLAINT: Estrogen receptor positive breast cancer  CURRENT TREATMENT: Adjuvant chemotherapy    BREAST CANCER HISTORY: From the original intake note:  Kenyata had bilateral screening mammography with tomography scheduled routinely 08/01/2015 at the [facility]. This showed a possible mass in the left breast. Accordingly on 08/15/2015 she underwent left diagnostic mammography with tomography and ultrasonography. This found the breast density to be category be. On spot compression views and was a 0.9 cm spiculated mass in the upper outer left breast, which on ultrasound was at 2:30 o'clock 5 cm from the nipple and measured 0.8 cm. Ultrasound of the axilla was benign.  Biopsy of the left breast mass in question 08/19/2015 found ([id number]) an invasive ductal carcinoma (E-cadherin positive) which was estrogen receptor 100% positive, progesterone receptor 100% positive, both with strong staining intensity, with an MIB-1 of 10%, and no HER-2 amplification, the signals ratio being 1.23 and the number per cell 1.85.  Her subsequent history is as detailed below  INTERVAL HISTORY: Brinlee returns today for follow-up of her left-sided breast cancer accompanied by her husband [name]. Today is day 8 cycle 2 of 4 planned cycles of cyclophosphamide and docetaxel given 21 days apart. -- She had a lot more trouble with cycle 2. It was "terrible". She has had no energy  and has had terrible mood swings. She has not wanted to get out of the house. She has been achy all over. The pain started on day 6 and continues. It particularly involves the arms and legs. She has been taking Excedrin for this. She also has unrelated pain in the right knee and lower leg. Apparently this is because she knocked over a fan and then fell over at hitting her right leg and also her for head.. She just doesn't know if she can go through another cycle like this, especially if it gets any worse  REVIEW OF SYSTEMS: Bennett has some sinus problems, but has had no significant problems with nausea or vomiting, and has had no diarrhea or constipation issues. She reports no peripheral neuropathy. A detailed review of systems today was otherwise stable  PAST MEDICAL HISTORY: Past Medical History:  Diagnosis Date  . Anemia   . Anxiety   . Arthritis   . Atrophic vaginitis   . Cancer [facility])    breast cancer  . Endometriosis   . Family history of breast cancer   . Family history of colon cancer   . GERD (gastroesophageal reflux disease)   . Headache(784.0)   . Heart murmur    as a child only  . Hyperlipidemia   . Hypertension   . Hypothyroidism   . Kidney stones   . Osteopenia   . Osteoporosis   . Rheumatic fever    age 75  . Sleep apnea    " very mild" does not wear CPAP  . Thyroid disease    Hypothyroid  . Ulcer   . UTI (lower urinary tract infection)     PAST SURGICAL HISTORY: Past Surgical History:  Procedure Laterality Date  . BREAST LUMPECTOMY WITH RADIOACTIVE SEED AND SENTINEL LYMPH NODE BIOPSY Left 09/09/2015   Procedure: LEFT BREAST LUMPECTOMY WITH RADIOACTIVE SEED AND SENTINEL LYMPH NODE BIOPSY;  Surgeon: Jackolyn Confer, MD;  Location: [facility];  Service: General;  Laterality: Left;  . LAPAROSCOPIC ENDOMETRIOSIS FULGURATION  1978  . PELVIC LAPAROSCOPY    . PORTACATH PLACEMENT N/A 10/04/2015   Procedure: INSERTION PORT-A-CATH WITH ULTRASOUND;  Surgeon: Jackolyn Confer,  MD;  Location: [facility];  Service: General;  Laterality: N/A;  . TONSILLECTOMY    . ULNAR NERVE REPAIR  2010    FAMILY HISTORY Family History  Problem Relation Age of Onset  . Breast cancer Mother     Age 4  . Hypertension Mother   . Heart disease Father   . Stroke Father   . Colon cancer Father     dx 4s  . Breast cancer Paternal Grandmother     Age 66  . Diabetes Paternal Grandmother   . Heart disease Maternal Grandmother   The patient's father died at age 75 the patient's mother was diagnosed with breast cancer at age 75 and again at age 75, which was the year of her death. In addition a paternal grandmother was diagnosed with breast cancer in her 80s. The patient's father had colon cancer. The patient had 2 brothers, no sisters. There is no history of ovarian cancer in the family.  GYNECOLOGIC HISTORY:  No LMP recorded. Patient is postmenopausal. Menarche age 32. The patient is GX P0. She went through menopause in her late 49s. She did not take hormone replacement. She never used oral contraceptives.  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 Her husband [name]") is retired from [employer]. They have an adopted son, [name], who is completing a second Engineer, production degree at [employer] state area he carries a diagnosis of schizophrenia, but is obviously very functional. The patient has no grandchildren. She is a Chief of Staff    ADVANCED DIRECTIVES:  NOT IN PLACE   HEALTH MAINTENANCE: Social History  Substance Use Topics  . Smoking status: Never Smoker  . Smokeless tobacco: Never Used  . Alcohol use No     Colonoscopy: 2012/[facility]   PAP: 2013?   Bone density: 2012/osteopenia   Lipid panel:  Allergies  Allergen Reactions  . Nitrofurantoin Nausea And Vomiting    GI upset  . Sulfamethoxazole-Trimethoprim Nausea And Vomiting and Other (See Comments)    GI upset    Current Outpatient Prescriptions  Medication Sig Dispense Refill  .  acetaminophen (TYLENOL) 500 MG tablet Take 1,000 mg by mouth every 6 (six) hours as needed (For pain.).    Marland Kitchen atenolol (TENORMIN) 50 MG tablet TAKE 1 TABLET (50 MG TOTAL) BY MOUTH DAILY. 30 tablet 5  . chlorhexidine (PERIDEX) 0.12 % solution Use as directed 15 mLs in the mouth or throat 2 (two) times daily.     . ciprofloxacin-dexamethasone (CIPRODEX) otic suspension Place 4 drops into the left ear 2 (two) times daily. 7.5 mL 0  . dexamethasone (DECADRON) 4 MG tablet Take 2 tablets (8 mg total) by mouth 2 (two) times daily. Start the day before Taxotere. Then again the day after chemo for 3 days. 30 tablet 1  . hydrochlorothiazide (HYDRODIURIL) 25 MG tablet TAKE 1 TABLET (25 MG TOTAL) BY MOUTH DAILY. (Patient taking differently: TAKE HALF A TABLET BY MOUTH DAILY.) 30 tablet 11  . HYDROcodone-acetaminophen (NORCO/VICODIN) 5-325 MG tablet Take 1-2 tablets by mouth every 4 (four)  hours as needed for moderate pain or severe pain. 30 tablet 0  . levothyroxine (SYNTHROID, LEVOTHROID) 50 MCG tablet TAKE 1 TABLET BY MOUTH DAILY BEFORE BREAKFAST 30 tablet 5  . lidocaine-prilocaine (EMLA) cream Apply to affected area once (Patient taking differently: Apply 1 application topically as needed (For port-a-cath.). ) 30 g 3  . LORazepam (ATIVAN) 0.5 MG tablet Take 1 tablet (0.5 mg total) by mouth every 8 (eight) hours. 30 tablet 2  . Multiple Vitamin (MULTIVITAMIN WITH MINERALS) TABS tablet Take 1 tablet by mouth daily.    . Naphazoline HCl (CLEAR EYES OP) Place 2 drops into both eyes daily.    Earney Navy Bicarbonate (ZEGERID) 20-1100 MG CAPS capsule Take 1 capsule by mouth daily before breakfast.    . ondansetron (ZOFRAN) 4 MG tablet Take 1 tablet (4 mg total) by mouth every 4 (four) hours as needed for nausea. 10 tablet 0  . OVER THE COUNTER MEDICATION Take 475 mg by mouth daily. Tumeric 478m    . prochlorperazine (COMPAZINE) 10 MG tablet Take 1 tablet (10 mg total) by mouth every 6 (six) hours as needed  (Nausea or vomiting). 30 tablet 1  . simvastatin (ZOCOR) 40 MG tablet TAKE 1 TABLET (40 MG TOTAL) BY MOUTH AT BEDTIME. 30 tablet 5  . venlafaxine XR (EFFEXOR-XR) 150 MG 24 hr capsule Take 1 capsule (150 mg total) by mouth daily. 90 capsule 3  . zolpidem (AMBIEN) 5 MG tablet Take one to two tablets qhs prn insomnia 60 tablet 2   No current facility-administered medications for this visit.     OBJECTIVE: Middle-aged white woman  Vitals:   11/21/15 1248  BP: (!) 123/57  Pulse: 71  Resp: 18  Temp: 98.7 F (37.1 C)     Body mass index is 27.53 kg/m.    ECOG FS:2 - Symptomatic, <50% confined to bed  Sclerae unicteric, EOMs intact Oropharynx clear and moist No cervical or supraclavicular adenopathy Lungs no rales or rhonchi Heart regular rate and rhythm Abd soft, nontender, positive bowel sounds MSK no focal spinal tenderness, no upper extremity lymphedema Neuro: nonfocal, well oriented, appropriate affect Breasts: Deferred   LAB RESULTS:  CMP     Component Value Date/Time   NA 140 11/21/2015 1224   K 4.3 11/21/2015 1224   CL 109 10/04/2015 0925   CO2 27 11/21/2015 1224   GLUCOSE 127 11/21/2015 1224   BUN 23.2 11/21/2015 1224   CREATININE 1.0 11/21/2015 1224   CALCIUM 9.8 11/21/2015 1224   PROT 6.9 11/21/2015 1224   ALBUMIN 3.8 11/21/2015 1224   AST 26 11/21/2015 1224   ALT 17 11/21/2015 1224   ALKPHOS 99 11/21/2015 1224   BILITOT 0.51 11/21/2015 1224   GFRNONAA >60 10/04/2015 0925   GFRAA >60 10/04/2015 0925    INo results found for: SPEP, UPEP  Lab Results  Component Value Date   WBC 25.2 (H) 11/21/2015   NEUTROABS 21.7 (H) 11/21/2015   HGB 12.3 11/21/2015   HCT 37.4 11/21/2015   MCV 87.1 11/21/2015   PLT 279 11/21/2015      Chemistry      Component Value Date/Time   NA 140 11/21/2015 1224   K 4.3 11/21/2015 1224   CL 109 10/04/2015 0925   CO2 27 11/21/2015 1224   BUN 23.2 11/21/2015 1224   CREATININE 1.0 11/21/2015 1224      Component Value  Date/Time   CALCIUM 9.8 11/21/2015 1224   ALKPHOS 99 11/21/2015 1224   AST 26 11/21/2015  1224   ALT 17 11/21/2015 1224   BILITOT 0.51 11/21/2015 1224       No results found for: LABCA2  No components found for: LABCA125  No results for input(s): INR in the last 168 hours.  Urinalysis    Component Value Date/Time   BILIRUBINUR 1+ 09/28/2014 1124   PROTEINUR 2+ 09/28/2014 1124   UROBILINOGEN 1.0 09/28/2014 1124   NITRITE positive 09/28/2014 1124   LEUKOCYTESUR large (3+) (A) 09/28/2014 1124      ELIGIBLE FOR AVAILABLE RESEARCH PROTOCOL: no  STUDIES: Dg Knee Complete 4 Views Right  Result Date: 11/11/2015 CLINICAL DATA:  Acute right knee pain and swelling after fall last night. EXAM: RIGHT KNEE - COMPLETE 4+ VIEW COMPARISON:  None. FINDINGS: No evidence of fracture, dislocation, or joint effusion. No evidence of arthropathy or other focal bone abnormality. Swelling of prepatellar soft tissues is noted most consistent with contusion. IMPRESSION: Prepatellar soft tissue swelling is noted consistent with contusion or injury. No bony abnormality is noted. Electronically Signed   By: Marijo Conception, M.D.   On: 11/11/2015 12:48   ASSESSMENT: 75 y.o. [place] woman status post left breast upper outer quadrant biopsy 08/19/2015 for a clinical T1b N0, stage IA invasive ductal carcinoma, strongly estrogen and progesterone receptor positive, HER-2 not amplified, with an MIB-1 of 10%  (1) left lumpectomy and sentinel lymph node sampling 09/09/2015 showed apT1b pN0, stage IA invasive ductal carcinoma, grade 1, repeat HER-2 again negative.   (2) Oncotype DX score of 29 predicts a 19% risk of recurrence outside the breast within the next 10 years if the patient's only systemic therapy is tamoxifen for 5 years. It also predicts a further risk reduction with chemotherapy of 8%.  (3) adjuvant chemotherapy will consist of cyclophosphamide and docetaxel every 21 days 4, started  10/24/2015  (4) adjuvant radiation to follow chemotherapy  (5) anti-estrogen therapy to follow at the completion of local treatment  (6) genetics testing 09/19/2015 through the Breast/Ovarian gene panel offered by [facility] found no deleterious mutations in ATM, BARD1, BRCA1, BRCA2, BRIP1, CDH1, CHEK2, EPCAM, FANCC, MLH1, MSH2, MSH6, NBN, PALB2, PMS2, PTEN, RAD51C, RAD51D, TP53, and XRCC2.  PLAN: Annakate tolerated cycle 2 so poorly I don't think are going to be able to get through 2 more cycles of these agents. I suspect she would do better with cyclophosphamide and doxorubicin and we discussed that at length today. In particular she understands that while we do have data showing that 4 cycles of TC is marginally superior to 4 cycles of AC (given 3 weeks apart), we do not have data comparing 4 cycles of TC to 2 cycles of TC followed by 2 cycles of before meals. My expectation is that the difference would be minimal.  We also discussed the possibility of damage to the heart muscle from the doxorubicin, as well as other possible toxicities, side effects and complications.  After much discussion we decided we would give the switch a try. I'm setting her up for an echocardiogram next week and to see me again in 3 weeks at which time she will receive cyclophosphamide and doxorubicin. If she tolerates it well she will have one final cycle 3 weeks after that.  She has a good understanding of this plan. She agrees with it. She knows a goal of treatment in her case is cure. She will call with any problems that may develop before the next visit.   :Chauncey Cruel, MD   11/21/2015 6:09 PM Medical Oncology  and Hematology [facility] [address] [place], [place] [address] Tel. [phone]    Fax. [phone]

## 2015-11-21 ENCOUNTER — Ambulatory Visit (HOSPITAL_BASED_OUTPATIENT_CLINIC_OR_DEPARTMENT_OTHER): Payer: Medicare Other | Admitting: Oncology

## 2015-11-21 ENCOUNTER — Other Ambulatory Visit (HOSPITAL_BASED_OUTPATIENT_CLINIC_OR_DEPARTMENT_OTHER): Payer: Medicare Other

## 2015-11-21 ENCOUNTER — Telehealth: Payer: Self-pay | Admitting: Oncology

## 2015-11-21 VITALS — BP 123/57 | HR 71 | Temp 98.7°F | Resp 18 | Ht 61.0 in | Wt 145.7 lb

## 2015-11-21 DIAGNOSIS — C50412 Malignant neoplasm of upper-outer quadrant of left female breast: Secondary | ICD-10-CM | POA: Diagnosis not present

## 2015-11-21 DIAGNOSIS — Z170 Estrogen receptor positive status [ER+]: Secondary | ICD-10-CM | POA: Diagnosis not present

## 2015-11-21 LAB — CBC WITH DIFFERENTIAL/PLATELET
BASO%: 0.4 % (ref 0.0–2.0)
Basophils Absolute: 0.1 10*3/uL (ref 0.0–0.1)
EOS%: 0.7 % (ref 0.0–7.0)
Eosinophils Absolute: 0.2 10*3/uL (ref 0.0–0.5)
HCT: 37.4 % (ref 34.8–46.6)
HGB: 12.3 g/dL (ref 11.6–15.9)
LYMPH%: 7.6 % — ABNORMAL LOW (ref 14.0–49.7)
MCH: 28.6 pg (ref 25.1–34.0)
MCHC: 32.8 g/dL (ref 31.5–36.0)
MCV: 87.1 fL (ref 79.5–101.0)
MONO#: 1.3 10*3/uL — ABNORMAL HIGH (ref 0.1–0.9)
MONO%: 5.2 % (ref 0.0–14.0)
NEUT#: 21.7 10*3/uL — ABNORMAL HIGH (ref 1.5–6.5)
NEUT%: 86.1 % — ABNORMAL HIGH (ref 38.4–76.8)
Platelets: 279 10*3/uL (ref 145–400)
RBC: 4.3 10*6/uL (ref 3.70–5.45)
RDW: 15.4 % — ABNORMAL HIGH (ref 11.2–14.5)
WBC: 25.2 10*3/uL — ABNORMAL HIGH (ref 3.9–10.3)
lymph#: 1.9 10*3/uL (ref 0.9–3.3)

## 2015-11-21 LAB — COMPREHENSIVE METABOLIC PANEL
ALT: 17 U/L (ref 0–55)
AST: 26 U/L (ref 5–34)
Albumin: 3.8 g/dL (ref 3.5–5.0)
Alkaline Phosphatase: 99 U/L (ref 40–150)
Anion Gap: 12 mEq/L — ABNORMAL HIGH (ref 3–11)
BUN: 23.2 mg/dL (ref 7.0–26.0)
CO2: 27 mEq/L (ref 22–29)
Calcium: 9.8 mg/dL (ref 8.4–10.4)
Chloride: 100 mEq/L (ref 98–109)
Creatinine: 1 mg/dL (ref 0.6–1.1)
EGFR: 56 mL/min/{1.73_m2} — ABNORMAL LOW (ref >90)
Glucose: 127 mg/dl (ref 70–140)
Potassium: 4.3 mEq/L (ref 3.5–5.1)
Sodium: 140 mEq/L (ref 136–145)
Total Bilirubin: 0.51 mg/dL (ref 0.20–1.20)
Total Protein: 6.9 g/dL (ref 6.4–8.3)

## 2015-11-21 LAB — TECHNOLOGIST REVIEW

## 2015-11-21 NOTE — Telephone Encounter (Signed)
per of to sch [facility] email to pre-cert-will call pt after reply

## 2015-12-01 ENCOUNTER — Other Ambulatory Visit: Payer: Medicare Other

## 2015-12-01 ENCOUNTER — Ambulatory Visit: Payer: Medicare Other

## 2015-12-03 ENCOUNTER — Ambulatory Visit: Payer: Medicare Other

## 2015-12-04 NOTE — Progress Notes (Signed)
[place]  [phone] [phone]     ID: CHRISTNA KULICK DOB: July 04, 1940  MR#: [id number]  MHW#:[id number]  Patient Care Team: [name], MD as PCP - General Chauncey Cruel, MD as Consulting Physician (Oncology) Jackolyn Confer, MD as Consulting Physician (General Surgery) Eppie Gibson, MD as Attending Physician (Radiation Oncology) Richmond Campbell, MD as Consulting Physician (Gastroenterology) Lavonna Monarch, MD as Consulting Physician (Dermatology) PCP: [name], MD OTHER MD:  CHIEF COMPLAINT: Estrogen receptor positive breast cancer  CURRENT TREATMENT: Adjuvant chemotherapy    BREAST CANCER HISTORY: From the original intake note:  Christel had bilateral screening mammography with tomography scheduled routinely 08/01/2015 at the [facility]. This showed a possible mass in the left breast. Accordingly on 08/15/2015 she underwent left diagnostic mammography with tomography and ultrasonography. This found the breast density to be category be. On spot compression views and was a 0.9 cm spiculated mass in the upper outer left breast, which on ultrasound was at 2:30 o'clock 5 cm from the nipple and measured 0.8 cm. Ultrasound of the axilla was benign.  Biopsy of the left breast mass in question 08/19/2015 found ([id number]) an invasive ductal carcinoma (E-cadherin positive) which was estrogen receptor 100% positive, progesterone receptor 100% positive, both with strong staining intensity, with an MIB-1 of 10%, and no HER-2 amplification, the signals ratio being 1.23 and the number per cell 1.85.  Her subsequent history is as detailed below  INTERVAL HISTORY: Neva returns today for follow-up of her estrogen receptor positive  breast cancer accompanied by her husband [name]. Today is day 1 cycle 3 of 4 planned cycles of cyclophosphamide and docetaxel given 21 days apart.  At the last visit we discussed switching her treatments to  cyclophosphamide and doxorubicin because she had such terrible side effects from the docetaxel. However after thinking about it more she decided she simply wants to continue with the prior treatment. She just "wants to get it over with". Accordingly she did not have the echocardiogram we had set up for her.  REVIEW OF SYSTEMS: Akisha tells me the worst problem she had from the earlier treatments was bony pain related to the Neulasta. She took Excedrin once for that and she says it helped. She also felt depressed and like she had no energy. She had no problems with mouth sores, diarrhea or constipation. She has developed some lesions on the dorsum of the right forearm she wanted me to look at today. She is having problems sleeping and her insurance will not pay for Ambien anymore, she says. She has a little bit of a dry cough. She has had a week in her right eye for the last 4 days. She denies excessive caffeine use. A detailed review of systems today was otherwise stable  PAST MEDICAL HISTORY: Past Medical History:  Diagnosis Date  . Anemia   . Anxiety   . Arthritis   . Atrophic vaginitis   . Cancer [facility])    breast cancer  . Endometriosis   . Family history of breast cancer   . Family history of colon cancer   . GERD (gastroesophageal reflux disease)   . Headache(784.0)   . Heart murmur    as a child only  . Hyperlipidemia   . Hypertension   . Hypothyroidism   . Kidney stones   . Osteopenia   . Osteoporosis   . Rheumatic fever    age 75  . Sleep apnea    " very mild"  does not wear CPAP  . Thyroid disease    Hypothyroid  . Ulcer   . UTI (lower urinary tract infection)     PAST SURGICAL HISTORY: Past Surgical History:  Procedure Laterality Date  . BREAST LUMPECTOMY WITH RADIOACTIVE SEED AND SENTINEL LYMPH NODE BIOPSY Left 09/09/2015   Procedure: LEFT BREAST LUMPECTOMY WITH RADIOACTIVE SEED AND SENTINEL LYMPH NODE BIOPSY;  Surgeon: Jackolyn Confer, MD;  Location: [place];  Service:  General;  Laterality: Left;  . LAPAROSCOPIC ENDOMETRIOSIS FULGURATION  1978  . PELVIC LAPAROSCOPY    . PORTACATH PLACEMENT N/A 10/04/2015   Procedure: INSERTION PORT-A-CATH WITH ULTRASOUND;  Surgeon: Jackolyn Confer, MD;  Location: [place];  Service: General;  Laterality: N/A;  . TONSILLECTOMY    . ULNAR NERVE REPAIR  2010    FAMILY HISTORY Family History  Problem Relation Age of Onset  . Breast cancer Mother     Age 53  . Hypertension Mother   . Heart disease Father   . Stroke Father   . Colon cancer Father     dx 19s  . Breast cancer Paternal Grandmother     Age 52  . Diabetes Paternal Grandmother   . Heart disease Maternal Grandmother   The patient's father died at age 61 the patient's mother was diagnosed with breast cancer at age 43 and again at age 74, which was the year of her death. In addition a paternal grandmother was diagnosed with breast cancer in her 46s. The patient's father had colon cancer. The patient had 2 brothers, no sisters. There is no history of ovarian cancer in the family.  GYNECOLOGIC HISTORY:  No LMP recorded. Patient is postmenopausal. Menarche age 44. The patient is GX P0. She went through menopause in her late 47s. She did not take hormone replacement. She never used oral contraceptives.  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 Her husband [name]") is retired from [employer]. They have an adopted son, [name], who is completing a second Engineer, production degree at [employer] state area he carries a diagnosis of schizophrenia, but is obviously very functional. The patient has no grandchildren. She is a Chief of Staff    ADVANCED DIRECTIVES:  NOT IN PLACE   HEALTH MAINTENANCE: Social History  Substance Use Topics  . Smoking status: Never Smoker  . Smokeless tobacco: Never Used  . Alcohol use No     Colonoscopy: 2012/[facility]   PAP: 2013?   Bone density: 2012/osteopenia   Lipid panel:  Allergies  Allergen Reactions  .  Nitrofurantoin Nausea And Vomiting    GI upset  . Sulfamethoxazole-Trimethoprim Nausea And Vomiting and Other (See Comments)    GI upset    Current Outpatient Prescriptions  Medication Sig Dispense Refill  . acetaminophen (TYLENOL) 500 MG tablet Take 1,000 mg by mouth every 6 (six) hours as needed (For pain.).    Marland Kitchen atenolol (TENORMIN) 50 MG tablet TAKE 1 TABLET (50 MG TOTAL) BY MOUTH DAILY. 30 tablet 5  . chlorhexidine (PERIDEX) 0.12 % solution Use as directed 15 mLs in the mouth or throat 2 (two) times daily.     Marland Kitchen dexamethasone (DECADRON) 4 MG tablet Take 2 tablets (8 mg total) by mouth 2 (two) times daily. Start the day before Taxotere. Then again the day after chemo for 3 days. 30 tablet 1  . hydrochlorothiazide (HYDRODIURIL) 25 MG tablet TAKE 1 TABLET (25 MG TOTAL) BY MOUTH DAILY. (Patient taking differently: TAKE HALF A TABLET BY MOUTH DAILY.) 30 tablet 11  .  HYDROcodone-acetaminophen (NORCO/VICODIN) 5-325 MG tablet Take 1-2 tablets by mouth every 4 (four) hours as needed for moderate pain or severe pain. 30 tablet 0  . levothyroxine (SYNTHROID, LEVOTHROID) 50 MCG tablet TAKE 1 TABLET BY MOUTH DAILY BEFORE BREAKFAST 30 tablet 5  . lidocaine-prilocaine (EMLA) cream Apply to affected area once (Patient taking differently: Apply 1 application topically as needed (For port-a-cath.). ) 30 g 3  . LORazepam (ATIVAN) 0.5 MG tablet Take 1 tablet (0.5 mg total) by mouth every 8 (eight) hours. 30 tablet 2  . Multiple Vitamin (MULTIVITAMIN WITH MINERALS) TABS tablet Take 1 tablet by mouth daily.    . Naphazoline HCl (CLEAR EYES OP) Place 2 drops into both eyes daily.    Earney Navy Bicarbonate (ZEGERID) 20-1100 MG CAPS capsule Take 1 capsule by mouth daily before breakfast.    . ondansetron (ZOFRAN) 4 MG tablet Take 1 tablet (4 mg total) by mouth every 4 (four) hours as needed for nausea. 10 tablet 0  . OVER THE COUNTER MEDICATION Take 475 mg by mouth daily. Tumeric 469m    .  prochlorperazine (COMPAZINE) 10 MG tablet Take 1 tablet (10 mg total) by mouth every 6 (six) hours as needed (Nausea or vomiting). 30 tablet 1  . simvastatin (ZOCOR) 40 MG tablet TAKE 1 TABLET (40 MG TOTAL) BY MOUTH AT BEDTIME. 30 tablet 5  . venlafaxine XR (EFFEXOR-XR) 150 MG 24 hr capsule Take 1 capsule (150 mg total) by mouth daily. 90 capsule 3  . zolpidem (AMBIEN) 5 MG tablet Take one to two tablets qhs prn insomnia 60 tablet 2   No current facility-administered medications for this visit.     OBJECTIVE: Middle-aged white woman Who appears stated age V42   12/05/15 0919  BP: (!) 148/69  Pulse: (!) 103  Resp: 17  Temp: 98.5 F (36.9 C)     Body mass index is 28.06 kg/m.    ECOG FS:2 - Symptomatic, <50% confined to bed  Sclerae unicteric, pupils round and equal Oropharynx clear and moist-- no thrush or other lesions No cervical or supraclavicular adenopathy Lungs no rales or rhonchi Heart regular rate and rhythm Abd soft, nontender, positive bowel sounds MSK no focal spinal tenderness, no upper extremity lymphedema Neuro: nonfocal, well oriented, appropriate affect Breasts: Deferred Skin: She has a roundish nonpalpable erythematous lesion on the dorsum of the right forearm consistent with eczema.  LAB RESULTS:  CMP     Component Value Date/Time   NA 141 12/05/2015 0902   K 4.3 12/05/2015 0902   CL 109 10/04/2015 0925   CO2 23 12/05/2015 0902   GLUCOSE 148 (H) 12/05/2015 0902   BUN 33.5 (H) 12/05/2015 0902   CREATININE 1.0 12/05/2015 0902   CALCIUM 10.1 12/05/2015 0902   PROT 7.9 12/05/2015 0902   ALBUMIN 4.1 12/05/2015 0902   AST 22 12/05/2015 0902   ALT 30 12/05/2015 0902   ALKPHOS 92 12/05/2015 0902   BILITOT 0.51 12/05/2015 0902   GFRNONAA >60 10/04/2015 0925   GFRAA >60 10/04/2015 0925    INo results found for: SPEP, UPEP  Lab Results  Component Value Date   WBC 13.4 (H) 12/05/2015   NEUTROABS 12.1 (H) 12/05/2015   HGB 12.5 12/05/2015   HCT 37.8  12/05/2015   MCV 88.9 12/05/2015   PLT 444 (H) 12/05/2015      Chemistry      Component Value Date/Time   NA 141 12/05/2015 0902   K 4.3 12/05/2015 0902   CL 109 10/04/2015  0925   CO2 23 12/05/2015 0902   BUN 33.5 (H) 12/05/2015 0902   CREATININE 1.0 12/05/2015 0902      Component Value Date/Time   CALCIUM 10.1 12/05/2015 0902   ALKPHOS 92 12/05/2015 0902   AST 22 12/05/2015 0902   ALT 30 12/05/2015 0902   BILITOT 0.51 12/05/2015 0902       No results found for: LABCA2  No components found for: LABCA125  No results for input(s): INR in the last 168 hours.  Urinalysis    Component Value Date/Time   BILIRUBINUR 1+ 09/28/2014 1124   PROTEINUR 2+ 09/28/2014 1124   UROBILINOGEN 1.0 09/28/2014 1124   NITRITE positive 09/28/2014 1124   LEUKOCYTESUR large (3+) (A) 09/28/2014 1124      ELIGIBLE FOR AVAILABLE RESEARCH PROTOCOL: no  STUDIES: Dg Knee Complete 4 Views Right  Result Date: 11/11/2015 CLINICAL DATA:  Acute right knee pain and swelling after fall last night. EXAM: RIGHT KNEE - COMPLETE 4+ VIEW COMPARISON:  None. FINDINGS: No evidence of fracture, dislocation, or joint effusion. No evidence of arthropathy or other focal bone abnormality. Swelling of prepatellar soft tissues is noted most consistent with contusion. IMPRESSION: Prepatellar soft tissue swelling is noted consistent with contusion or injury. No bony abnormality is noted. Electronically Signed   By: Marijo Conception, M.D.   On: 11/11/2015 12:48   ASSESSMENT: 75 y.o. [place] woman status post left breast upper outer quadrant biopsy 08/19/2015 for a clinical T1b N0, stage IA invasive ductal carcinoma, strongly estrogen and progesterone receptor positive, HER-2 not amplified, with an MIB-1 of 10%  (1) left lumpectomy and sentinel lymph node sampling 09/09/2015 showed apT1b pN0, stage IA invasive ductal carcinoma, grade 1, repeat HER-2 again negative.   (2) Oncotype DX score of 29 predicts a 19% risk of  recurrence outside the breast within the next 10 years if the patient's only systemic therapy is tamoxifen for 5 years. It also predicts a further risk reduction with chemotherapy of 8%.  (3) adjuvant chemotherapy will consist of cyclophosphamide and docetaxel every 21 days 4, started 10/24/2015  (4) adjuvant radiation to follow chemotherapy  (5) anti-estrogen therapy to follow at the completion of local treatment  (6) genetics testing 09/19/2015 through the Breast/Ovarian gene panel offered by [facility] found no deleterious mutations in ATM, BARD1, BRCA1, BRCA2, BRIP1, CDH1, CHEK2, EPCAM, FANCC, MLH1, MSH2, MSH6, NBN, PALB2, PMS2, PTEN, RAD51C, RAD51D, TP53, and XRCC2.  PLAN: Alvenia strongly wishes to continue on the current treatment and that is reasonable. Accordingly we are going to make a few changes to try to optimize her tolerance.  First she is going to continue the dexamethasone 4 mg on days 5677. I think that will help with the lack of energy. I also put her in for intravenous fluids on days 5 and 6. She may make you so that at her discretion. If she does not feel it would help then she will let me know.  As far as the pain is concerned I wrote for hydrocodone/APAP, and also suggested she take Aleve 2 tablets 3 times a day with food as needed for the pain.  Of course she knows she will get somewhat constipated from the hydrocodone if she does use it. We discussed a bowel prophylaxis regimen she can start as needed.  I think she has eczema on the dorsum of her right forearm and I suggested Cortaid for that. She also has an appointment with her dermatologist within the next 2 weeks. She is  no longer able to obtain Ambien through her insurance. I wrote her for lorazepam and suggested she could cut that pill in half if it is too strong for her.  Otherwise we are proceeding with the third cycle today. She will return to see me September 8, in preparation for her final cycle September 11 (since  I have no availability on September 11).  She knows to call for any problems that may develop before that visit.     :Chauncey Cruel, MD   12/05/2015 9:59 AM Medical Oncology and Hematology [facility] [place], [place] [address] Tel. [phone]    Fax. [phone]

## 2015-12-05 ENCOUNTER — Ambulatory Visit (HOSPITAL_BASED_OUTPATIENT_CLINIC_OR_DEPARTMENT_OTHER): Payer: Medicare Other | Admitting: Oncology

## 2015-12-05 ENCOUNTER — Ambulatory Visit (HOSPITAL_COMMUNITY)
Admission: RE | Admit: 2015-12-05 | Discharge: 2015-12-05 | Disposition: A | Discharge status: Home / Self Care | Payer: Medicare Other | Source: Ambulatory Visit | Attending: Oncology | Admitting: Oncology

## 2015-12-05 ENCOUNTER — Other Ambulatory Visit: Payer: Self-pay | Admitting: *Deleted

## 2015-12-05 ENCOUNTER — Encounter (HOSPITAL_COMMUNITY): Payer: Self-pay | Admitting: Interventional Radiology

## 2015-12-05 ENCOUNTER — Other Ambulatory Visit (HOSPITAL_BASED_OUTPATIENT_CLINIC_OR_DEPARTMENT_OTHER): Payer: Medicare Other

## 2015-12-05 ENCOUNTER — Other Ambulatory Visit (HOSPITAL_COMMUNITY): Payer: Medicare Other

## 2015-12-05 ENCOUNTER — Other Ambulatory Visit: Payer: Self-pay | Admitting: Oncology

## 2015-12-05 ENCOUNTER — Ambulatory Visit (HOSPITAL_BASED_OUTPATIENT_CLINIC_OR_DEPARTMENT_OTHER): Payer: Medicare Other

## 2015-12-05 VITALS — BP 148/69 | HR 103 | Temp 98.5°F | Resp 17 | Ht 61.0 in | Wt 148.5 lb

## 2015-12-05 VITALS — HR 91

## 2015-12-05 DIAGNOSIS — Z5189 Encounter for other specified aftercare: Secondary | ICD-10-CM

## 2015-12-05 DIAGNOSIS — G893 Neoplasm related pain (acute) (chronic): Secondary | ICD-10-CM

## 2015-12-05 DIAGNOSIS — Z170 Estrogen receptor positive status [ER+]: Secondary | ICD-10-CM | POA: Diagnosis not present

## 2015-12-05 DIAGNOSIS — Z5111 Encounter for antineoplastic chemotherapy: Secondary | ICD-10-CM | POA: Diagnosis not present

## 2015-12-05 DIAGNOSIS — C50412 Malignant neoplasm of upper-outer quadrant of left female breast: Secondary | ICD-10-CM

## 2015-12-05 DIAGNOSIS — Z452 Encounter for adjustment and management of vascular access device: Secondary | ICD-10-CM | POA: Insufficient documentation

## 2015-12-05 HISTORY — PX: IR GENERIC HISTORICAL: IMG1180011

## 2015-12-05 LAB — COMPREHENSIVE METABOLIC PANEL
ALT: 30 U/L (ref 0–55)
AST: 22 U/L (ref 5–34)
Albumin: 4.1 g/dL (ref 3.5–5.0)
Alkaline Phosphatase: 92 U/L (ref 40–150)
Anion Gap: 15 mEq/L — ABNORMAL HIGH (ref 3–11)
BUN: 33.5 mg/dL — ABNORMAL HIGH (ref 7.0–26.0)
CO2: 23 mEq/L (ref 22–29)
Calcium: 10.1 mg/dL (ref 8.4–10.4)
Chloride: 104 mEq/L (ref 98–109)
Creatinine: 1 mg/dL (ref 0.6–1.1)
EGFR: 54 mL/min/{1.73_m2} — ABNORMAL LOW (ref >90)
Glucose: 148 mg/dl — ABNORMAL HIGH (ref 70–140)
Potassium: 4.3 mEq/L (ref 3.5–5.1)
Sodium: 141 mEq/L (ref 136–145)
Total Bilirubin: 0.51 mg/dL (ref 0.20–1.20)
Total Protein: 7.9 g/dL (ref 6.4–8.3)

## 2015-12-05 LAB — CBC WITH DIFFERENTIAL/PLATELET
BASO%: 0.1 % (ref 0.0–2.0)
Basophils Absolute: 0 10*3/uL (ref 0.0–0.1)
EOS%: 0.1 % (ref 0.0–7.0)
Eosinophils Absolute: 0 10*3/uL (ref 0.0–0.5)
HCT: 37.8 % (ref 34.8–46.6)
HGB: 12.5 g/dL (ref 11.6–15.9)
LYMPH%: 5.7 % — ABNORMAL LOW (ref 14.0–49.7)
MCH: 29.4 pg (ref 25.1–34.0)
MCHC: 33.1 g/dL (ref 31.5–36.0)
MCV: 88.9 fL (ref 79.5–101.0)
MONO#: 0.5 10*3/uL (ref 0.1–0.9)
MONO%: 3.9 % (ref 0.0–14.0)
NEUT#: 12.1 10*3/uL — ABNORMAL HIGH (ref 1.5–6.5)
NEUT%: 90.2 % — ABNORMAL HIGH (ref 38.4–76.8)
Platelets: 444 10*3/uL — ABNORMAL HIGH (ref 145–400)
RBC: 4.25 10*6/uL (ref 3.70–5.45)
RDW: 17.2 % — ABNORMAL HIGH (ref 11.2–14.5)
WBC: 13.4 10*3/uL — ABNORMAL HIGH (ref 3.9–10.3)
lymph#: 0.8 10*3/uL — ABNORMAL LOW (ref 0.9–3.3)

## 2015-12-05 MED ORDER — HEPARIN SOD (PORK) LOCK FLUSH 100 UNIT/ML IV SOLN
INTRAVENOUS | Status: AC
  Administered 2015-12-05: 500 [IU]
  Filled 2015-12-05: qty 5

## 2015-12-05 MED ORDER — SODIUM CHLORIDE 0.9 % IV SOLN
600.0000 mg/m2 | Freq: Once | INTRAVENOUS | Status: AC
  Administered 2015-12-05: 1020 mg via INTRAVENOUS
  Filled 2015-12-05: qty 51

## 2015-12-05 MED ORDER — IOPAMIDOL (ISOVUE-300) INJECTION 61%
50.0000 mL | Freq: Once | INTRAVENOUS | Status: AC | PRN
  Administered 2015-12-05: 5 mL via INTRAVENOUS

## 2015-12-05 MED ORDER — LORAZEPAM 0.5 MG PO TABS: 0.5000 mg | ORAL_TABLET | Freq: Three times a day (TID) | ORAL | 2 refills | Status: DC

## 2015-12-05 MED ORDER — FAMOTIDINE IN NACL 20-0.9 MG/50ML-% IV SOLN
20.0000 mg | Freq: Once | INTRAVENOUS | Status: AC
  Administered 2015-12-05: 20 mg via INTRAVENOUS

## 2015-12-05 MED ORDER — HEPARIN SOD (PORK) LOCK FLUSH 100 UNIT/ML IV SOLN
500.0000 [IU] | Freq: Once | INTRAVENOUS | Status: AC
Start: 2015-12-05 — End: 2015-12-05
  Administered 2015-12-05: 500 [IU]

## 2015-12-05 MED ORDER — PALONOSETRON HCL INJECTION 0.25 MG/5ML
INTRAVENOUS | Status: AC
  Filled 2015-12-05: qty 5

## 2015-12-05 MED ORDER — HYDROCODONE-ACETAMINOPHEN 5-325 MG PO TABS: 1.0000 | ORAL_TABLET | ORAL | 0 refills | Status: DC | PRN

## 2015-12-05 MED ORDER — FAMOTIDINE IN NACL 20-0.9 MG/50ML-% IV SOLN
INTRAVENOUS | Status: AC
  Filled 2015-12-05: qty 50

## 2015-12-05 MED ORDER — SODIUM CHLORIDE 0.9 % IV SOLN
75.0000 mg/m2 | Freq: Once | INTRAVENOUS | Status: AC
  Administered 2015-12-05: 130 mg via INTRAVENOUS
  Filled 2015-12-05: qty 13

## 2015-12-05 MED ORDER — PALONOSETRON HCL INJECTION 0.25 MG/5ML
0.2500 mg | Freq: Once | INTRAVENOUS | Status: AC
  Administered 2015-12-05: 0.25 mg via INTRAVENOUS

## 2015-12-05 MED ORDER — SODIUM CHLORIDE 0.9 % IV SOLN
10.0000 mg | Freq: Once | INTRAVENOUS | Status: AC
  Administered 2015-12-05: 10 mg via INTRAVENOUS
  Filled 2015-12-05: qty 1

## 2015-12-05 MED ORDER — PEGFILGRASTIM 6 MG/0.6ML ~~LOC~~ PSKT
6.0000 mg | PREFILLED_SYRINGE | Freq: Once | SUBCUTANEOUS | Status: AC
  Administered 2015-12-05: 6 mg via SUBCUTANEOUS
  Filled 2015-12-05: qty 0.6

## 2015-12-05 MED ORDER — SODIUM CHLORIDE 0.9 % IV SOLN
Freq: Once | INTRAVENOUS | Status: AC
  Administered 2015-12-05: 11:00:00 via INTRAVENOUS

## 2015-12-05 NOTE — Patient Instructions (Signed)
[facility] Discharge Instructions for Patients Receiving Chemotherapy  Today you received the following chemotherapy agents Taxotere and Cytoxan   To help prevent nausea and vomiting after your treatment, we encourage you to take your nausea medication as directed. No Zofran for 3 days. Take Compazine instead.    If you develop nausea and vomiting that is not controlled by your nausea medication, call the clinic.   BELOW ARE SYMPTOMS THAT SHOULD BE REPORTED IMMEDIATELY:  *FEVER GREATER THAN 100.5 F  *CHILLS WITH OR WITHOUT FEVER  NAUSEA AND VOMITING THAT IS NOT CONTROLLED WITH YOUR NAUSEA MEDICATION  *UNUSUAL SHORTNESS OF BREATH  *UNUSUAL BRUISING OR BLEEDING  TENDERNESS IN MOUTH AND THROAT WITH OR WITHOUT PRESENCE OF ULCERS  *URINARY PROBLEMS  *BOWEL PROBLEMS  UNUSUAL RASH Items with * indicate a potential emergency and should be followed up as soon as possible.  Feel free to call the clinic you have any questions or concerns. The clinic phone number is [phone].  Please show the CHEMO ALERT CARD at check-in to the Emergency Department and triage nurse.   

## 2015-12-05 NOTE — Progress Notes (Signed)
Pt dislodged IV with minimal blood leaking just at site. No chemo infiltrated or leaking on skin. No discomfort at all to site per pt. Infusion paused and new IV immediately placed.

## 2015-12-05 NOTE — Progress Notes (Signed)
Attempted to access port X 3 by 2 different RNs. Unable to successfully access. No blood return, unable to flush and resistance met upon attempting to access. Port appears to be flipped over. MD [name] aware. PIV started for infusion today. Appointment made to evaluate Port placement in IR today.

## 2015-12-06 ENCOUNTER — Other Ambulatory Visit (HOSPITAL_COMMUNITY): Payer: Medicare Other

## 2015-12-06 ENCOUNTER — Other Ambulatory Visit: Payer: Self-pay | Admitting: *Deleted

## 2015-12-06 DIAGNOSIS — C50412 Malignant neoplasm of upper-outer quadrant of left female breast: Secondary | ICD-10-CM

## 2015-12-07 ENCOUNTER — Ambulatory Visit: Payer: Medicare Other

## 2015-12-07 ENCOUNTER — Telehealth: Payer: Self-pay | Admitting: Oncology

## 2015-12-07 NOTE — Telephone Encounter (Signed)
12/09/2015 and 12/10/2015 Appointments canceled per patient request. Appointments were to be canceled earlier. Patient had infusion treatment on 08/21/207 and wished to cancel 08/25 and 08/26 appointments.

## 2015-12-09 ENCOUNTER — Ambulatory Visit: Payer: Medicare Other

## 2015-12-10 ENCOUNTER — Ambulatory Visit: Payer: Medicare Other

## 2015-12-13 ENCOUNTER — Ambulatory Visit: Payer: Medicare Other | Admitting: Oncology

## 2015-12-22 ENCOUNTER — Other Ambulatory Visit: Payer: Self-pay

## 2015-12-22 ENCOUNTER — Other Ambulatory Visit: Payer: Medicare Other

## 2015-12-22 ENCOUNTER — Ambulatory Visit: Payer: Medicare Other

## 2015-12-22 DIAGNOSIS — D239 Other benign neoplasm of skin, unspecified: Secondary | ICD-10-CM | POA: Diagnosis not present

## 2015-12-22 DIAGNOSIS — D485 Neoplasm of uncertain behavior of skin: Secondary | ICD-10-CM | POA: Diagnosis not present

## 2015-12-22 DIAGNOSIS — L820 Inflamed seborrheic keratosis: Secondary | ICD-10-CM | POA: Diagnosis not present

## 2015-12-22 DIAGNOSIS — L570 Actinic keratosis: Secondary | ICD-10-CM | POA: Diagnosis not present

## 2015-12-23 ENCOUNTER — Encounter: Payer: Self-pay | Admitting: Oncology

## 2015-12-23 ENCOUNTER — Ambulatory Visit: Payer: Medicare Other | Admitting: Oncology

## 2015-12-24 ENCOUNTER — Ambulatory Visit: Payer: Medicare Other

## 2015-12-26 ENCOUNTER — Other Ambulatory Visit: Payer: Self-pay | Admitting: Oncology

## 2015-12-26 ENCOUNTER — Other Ambulatory Visit (HOSPITAL_BASED_OUTPATIENT_CLINIC_OR_DEPARTMENT_OTHER): Payer: Medicare Other

## 2015-12-26 ENCOUNTER — Encounter: Payer: Self-pay | Admitting: *Deleted

## 2015-12-26 ENCOUNTER — Ambulatory Visit (HOSPITAL_BASED_OUTPATIENT_CLINIC_OR_DEPARTMENT_OTHER): Payer: Medicare Other

## 2015-12-26 VITALS — BP 171/57 | HR 72 | Temp 97.7°F | Resp 18

## 2015-12-26 DIAGNOSIS — Z5189 Encounter for other specified aftercare: Secondary | ICD-10-CM | POA: Diagnosis not present

## 2015-12-26 DIAGNOSIS — C50412 Malignant neoplasm of upper-outer quadrant of left female breast: Secondary | ICD-10-CM

## 2015-12-26 DIAGNOSIS — Z5111 Encounter for antineoplastic chemotherapy: Secondary | ICD-10-CM | POA: Diagnosis not present

## 2015-12-26 LAB — CBC WITH DIFFERENTIAL/PLATELET
BASO%: 0.1 % (ref 0.0–2.0)
Basophils Absolute: 0 10*3/uL (ref 0.0–0.1)
EOS%: 0.1 % (ref 0.0–7.0)
Eosinophils Absolute: 0 10*3/uL (ref 0.0–0.5)
HCT: 34.6 % — ABNORMAL LOW (ref 34.8–46.6)
HGB: 11.3 g/dL — ABNORMAL LOW (ref 11.6–15.9)
LYMPH%: 6.7 % — ABNORMAL LOW (ref 14.0–49.7)
MCH: 29.8 pg (ref 25.1–34.0)
MCHC: 32.7 g/dL (ref 31.5–36.0)
MCV: 91.3 fL (ref 79.5–101.0)
MONO#: 0.9 10*3/uL (ref 0.1–0.9)
MONO%: 6.3 % (ref 0.0–14.0)
NEUT#: 11.9 10*3/uL — ABNORMAL HIGH (ref 1.5–6.5)
NEUT%: 86.8 % — ABNORMAL HIGH (ref 38.4–76.8)
Platelets: 355 10*3/uL (ref 145–400)
RBC: 3.79 10*6/uL (ref 3.70–5.45)
RDW: 17.1 % — ABNORMAL HIGH (ref 11.2–14.5)
WBC: 13.7 10*3/uL — ABNORMAL HIGH (ref 3.9–10.3)
lymph#: 0.9 10*3/uL (ref 0.9–3.3)

## 2015-12-26 LAB — COMPREHENSIVE METABOLIC PANEL
ALT: 23 U/L (ref 0–55)
AST: 19 U/L (ref 5–34)
Albumin: 3.6 g/dL (ref 3.5–5.0)
Alkaline Phosphatase: 99 U/L (ref 40–150)
Anion Gap: 11 mEq/L (ref 3–11)
BUN: 21.3 mg/dL (ref 7.0–26.0)
CO2: 23 mEq/L (ref 22–29)
Calcium: 9.6 mg/dL (ref 8.4–10.4)
Chloride: 108 mEq/L (ref 98–109)
Creatinine: 0.8 mg/dL (ref 0.6–1.1)
EGFR: 71 mL/min/{1.73_m2} — ABNORMAL LOW (ref >90)
Glucose: 109 mg/dl (ref 70–140)
Potassium: 4 mEq/L (ref 3.5–5.1)
Sodium: 142 mEq/L (ref 136–145)
Total Bilirubin: 0.39 mg/dL (ref 0.20–1.20)
Total Protein: 7.1 g/dL (ref 6.4–8.3)

## 2015-12-26 MED ORDER — HEPARIN SOD (PORK) LOCK FLUSH 100 UNIT/ML IV SOLN
500.0000 [IU] | Freq: Once | INTRAVENOUS | Status: AC | PRN
  Administered 2015-12-26: 500 [IU]
  Filled 2015-12-26: qty 5

## 2015-12-26 MED ORDER — FAMOTIDINE IN NACL 20-0.9 MG/50ML-% IV SOLN
20.0000 mg | Freq: Once | INTRAVENOUS | Status: AC
  Administered 2015-12-26: 20 mg via INTRAVENOUS

## 2015-12-26 MED ORDER — PALONOSETRON HCL INJECTION 0.25 MG/5ML
INTRAVENOUS | Status: AC
  Filled 2015-12-26: qty 5

## 2015-12-26 MED ORDER — LIDOCAINE-PRILOCAINE 2.5-2.5 % EX CREA
TOPICAL_CREAM | CUTANEOUS | Status: AC
  Filled 2015-12-26: qty 5

## 2015-12-26 MED ORDER — SODIUM CHLORIDE 0.9 % IV SOLN
75.0000 mg/m2 | Freq: Once | INTRAVENOUS | Status: AC
  Administered 2015-12-26: 130 mg via INTRAVENOUS
  Filled 2015-12-26: qty 13

## 2015-12-26 MED ORDER — SODIUM CHLORIDE 0.9 % IV SOLN
Freq: Once | INTRAVENOUS | Status: AC
  Administered 2015-12-26: 11:00:00 via INTRAVENOUS

## 2015-12-26 MED ORDER — PALONOSETRON HCL INJECTION 0.25 MG/5ML
0.2500 mg | Freq: Once | INTRAVENOUS | Status: AC
  Administered 2015-12-26: 0.25 mg via INTRAVENOUS

## 2015-12-26 MED ORDER — FAMOTIDINE IN NACL 20-0.9 MG/50ML-% IV SOLN
INTRAVENOUS | Status: AC
  Filled 2015-12-26: qty 50

## 2015-12-26 MED ORDER — DEXAMETHASONE SODIUM PHOSPHATE 100 MG/10ML IJ SOLN
10.0000 mg | Freq: Once | INTRAMUSCULAR | Status: AC
  Administered 2015-12-26: 10 mg via INTRAVENOUS
  Filled 2015-12-26: qty 1

## 2015-12-26 MED ORDER — PEGFILGRASTIM 6 MG/0.6ML ~~LOC~~ PSKT
6.0000 mg | PREFILLED_SYRINGE | Freq: Once | SUBCUTANEOUS | Status: AC
  Administered 2015-12-26: 6 mg via SUBCUTANEOUS
  Filled 2015-12-26: qty 0.6

## 2015-12-26 MED ORDER — SODIUM CHLORIDE 0.9 % IV SOLN
600.0000 mg/m2 | Freq: Once | INTRAVENOUS | Status: AC
  Administered 2015-12-26: 1020 mg via INTRAVENOUS
  Filled 2015-12-26: qty 51

## 2015-12-26 MED ORDER — SODIUM CHLORIDE 0.9% FLUSH
10.0000 mL | INTRAVENOUS | Status: DC | PRN
  Administered 2015-12-26: 10 mL
  Filled 2015-12-26: qty 10

## 2015-12-26 NOTE — Patient Instructions (Signed)
[facility] Discharge Instructions for Patients Receiving Chemotherapy  Today you received the following chemotherapy agents Taxotere and Cytoxan   To help prevent nausea and vomiting after your treatment, we encourage you to take your nausea medication as directed. No Zofran for 3 days. Take Compazine instead.    If you develop nausea and vomiting that is not controlled by your nausea medication, call the clinic.   BELOW ARE SYMPTOMS THAT SHOULD BE REPORTED IMMEDIATELY:  *FEVER GREATER THAN 100.5 F  *CHILLS WITH OR WITHOUT FEVER  NAUSEA AND VOMITING THAT IS NOT CONTROLLED WITH YOUR NAUSEA MEDICATION  *UNUSUAL SHORTNESS OF BREATH  *UNUSUAL BRUISING OR BLEEDING  TENDERNESS IN MOUTH AND THROAT WITH OR WITHOUT PRESENCE OF ULCERS  *URINARY PROBLEMS  *BOWEL PROBLEMS  UNUSUAL RASH Items with * indicate a potential emergency and should be followed up as soon as possible.  Feel free to call the clinic you have any questions or concerns. The clinic phone number is [phone].  Please show the CHEMO ALERT CARD at check-in to the Emergency Department and triage nurse.   

## 2015-12-28 ENCOUNTER — Telehealth: Payer: Self-pay

## 2015-12-28 ENCOUNTER — Ambulatory Visit: Payer: Medicare Other

## 2015-12-28 NOTE — Telephone Encounter (Signed)
lvm that pt has lab and MD on the 15th. Please call us to let us know if she feels she may be needing fluid on Friday Saturday or Monday. She had chemo on 9/11.

## 2015-12-29 ENCOUNTER — Telehealth: Payer: Self-pay | Admitting: *Deleted

## 2015-12-30 ENCOUNTER — Other Ambulatory Visit: Payer: Self-pay | Admitting: *Deleted

## 2015-12-30 ENCOUNTER — Ambulatory Visit (HOSPITAL_BASED_OUTPATIENT_CLINIC_OR_DEPARTMENT_OTHER): Payer: Medicare Other

## 2015-12-30 ENCOUNTER — Other Ambulatory Visit (HOSPITAL_BASED_OUTPATIENT_CLINIC_OR_DEPARTMENT_OTHER): Payer: Medicare Other

## 2015-12-30 ENCOUNTER — Telehealth: Payer: Self-pay | Admitting: Oncology

## 2015-12-30 ENCOUNTER — Ambulatory Visit (HOSPITAL_BASED_OUTPATIENT_CLINIC_OR_DEPARTMENT_OTHER): Payer: Medicare Other | Admitting: Oncology

## 2015-12-30 ENCOUNTER — Inpatient Hospital Stay (HOSPITAL_COMMUNITY): Admission: RE | Admit: 2015-12-30 | Payer: Medicare Other | Source: Ambulatory Visit

## 2015-12-30 VITALS — BP 118/51 | HR 72 | Temp 98.5°F | Resp 16

## 2015-12-30 VITALS — BP 104/52 | HR 84 | Temp 97.8°F | Resp 18 | Ht 61.0 in | Wt 144.5 lb

## 2015-12-30 DIAGNOSIS — C50412 Malignant neoplasm of upper-outer quadrant of left female breast: Secondary | ICD-10-CM

## 2015-12-30 DIAGNOSIS — Z170 Estrogen receptor positive status [ER+]: Secondary | ICD-10-CM | POA: Diagnosis not present

## 2015-12-30 DIAGNOSIS — M8580 Other specified disorders of bone density and structure, unspecified site: Secondary | ICD-10-CM

## 2015-12-30 LAB — CBC WITH DIFFERENTIAL/PLATELET
BASO%: 1.1 % (ref 0.0–2.0)
Basophils Absolute: 0.1 10*3/uL (ref 0.0–0.1)
EOS%: 1 % (ref 0.0–7.0)
Eosinophils Absolute: 0.1 10*3/uL (ref 0.0–0.5)
HCT: 35.4 % (ref 34.8–46.6)
HGB: 11.5 g/dL — ABNORMAL LOW (ref 11.6–15.9)
LYMPH%: 2.4 % — ABNORMAL LOW (ref 14.0–49.7)
MCH: 29.6 pg (ref 25.1–34.0)
MCHC: 32.5 g/dL (ref 31.5–36.0)
MCV: 91.3 fL (ref 79.5–101.0)
MONO#: 0.1 10*3/uL (ref 0.1–0.9)
MONO%: 0.7 % (ref 0.0–14.0)
NEUT#: 11.2 10*3/uL — ABNORMAL HIGH (ref 1.5–6.5)
NEUT%: 94.8 % — ABNORMAL HIGH (ref 38.4–76.8)
Platelets: 252 10*3/uL (ref 145–400)
RBC: 3.88 10*6/uL (ref 3.70–5.45)
RDW: 18.1 % — ABNORMAL HIGH (ref 11.2–14.5)
WBC: 11.9 10*3/uL — ABNORMAL HIGH (ref 3.9–10.3)
lymph#: 0.3 10*3/uL — ABNORMAL LOW (ref 0.9–3.3)

## 2015-12-30 LAB — COMPREHENSIVE METABOLIC PANEL
ALT: 32 U/L (ref 0–55)
AST: 26 U/L (ref 5–34)
Albumin: 3.8 g/dL (ref 3.5–5.0)
Alkaline Phosphatase: 152 U/L — ABNORMAL HIGH (ref 40–150)
Anion Gap: 12 mEq/L — ABNORMAL HIGH (ref 3–11)
BUN: 25.9 mg/dL (ref 7.0–26.0)
CO2: 25 mEq/L (ref 22–29)
Calcium: 10.1 mg/dL (ref 8.4–10.4)
Chloride: 104 mEq/L (ref 98–109)
Creatinine: 1.1 mg/dL (ref 0.6–1.1)
EGFR: 52 mL/min/{1.73_m2} — ABNORMAL LOW (ref >90)
Glucose: 110 mg/dl (ref 70–140)
Potassium: 4.2 mEq/L (ref 3.5–5.1)
Sodium: 141 mEq/L (ref 136–145)
Total Bilirubin: 0.52 mg/dL (ref 0.20–1.20)
Total Protein: 7.1 g/dL (ref 6.4–8.3)

## 2015-12-30 MED ORDER — HEPARIN SOD (PORK) LOCK FLUSH 100 UNIT/ML IV SOLN
500.0000 [IU] | Freq: Once | INTRAVENOUS | Status: AC
  Administered 2015-12-30: 500 [IU]
  Filled 2015-12-30: qty 5

## 2015-12-30 MED ORDER — SODIUM CHLORIDE 0.9 % IV SOLN
Freq: Once | INTRAVENOUS | Status: AC
  Administered 2015-12-30: 11:00:00 via INTRAVENOUS

## 2015-12-30 MED ORDER — SODIUM CHLORIDE 0.9% FLUSH
10.0000 mL | Freq: Once | INTRAVENOUS | Status: AC
  Administered 2015-12-30: 10 mL
  Filled 2015-12-30: qty 10

## 2015-12-30 NOTE — Patient Instructions (Signed)

## 2015-12-30 NOTE — Progress Notes (Signed)
[name]  Telephone:(336) [phone] Fax:(336) [phone]     ID: [name] DOB: 10/12/1940  MR#: [id number]  EQA#:[id number]  Patient Care Team: [name], MD as PCP - General Chauncey Cruel, MD as Consulting Physician (Oncology) Jackolyn Confer, MD as Consulting Physician (General Surgery) Eppie Gibson, MD as Attending Physician (Radiation Oncology) Richmond Campbell, MD as Consulting Physician (Gastroenterology) Lavonna Monarch, MD as Consulting Physician (Dermatology) PCP: [name], MD OTHER MD:  CHIEF COMPLAINT: Estrogen receptor positive breast cancer  CURRENT TREATMENT: Adjuvant radiation   BREAST CANCER HISTORY: From the original intake note:  [name] had bilateral screening mammography with tomography scheduled routinely 08/01/2015 at the [facility]. This showed a possible mass in the left breast. Accordingly on 08/15/2015 she underwent left diagnostic mammography with tomography and ultrasonography. This found the breast density to be category be. On spot compression views and was a 0.9 cm spiculated mass in the upper outer left breast, which on ultrasound was at 2:30 o'clock 5 cm from the nipple and measured 0.8 cm. Ultrasound of the axilla was benign.  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  Her subsequent history is as detailed below  INTERVAL HISTORY: Carloyn returns today for follow-up of her left-sided  breast cancer accompanied by her husband [name]. Today is day 6 cycle 4 of 4 planned cycles of cyclophosphamide and docetaxel given 21 days apart.  REVIEW OF SYSTEMS: Kilah did generally well with her treatments. She did have  significant fatigue, particularly towards the end of the first week of each cycle. She has had a little bit of tearing, but that seems to be resolving. Her nails did not significantly change and she never developed any peripheral neuropathy symptoms. She had a sore mouth but no mouth sores and she never had nausea or vomiting problems. She had pain from the Neulasta with the original cycle but not subsequent cycles. Of course she lost her hair. She had difficulties with her port but this did manage to function well enough to get her through the chemotherapy. She becomes dehydrated easily and she will receive some fluids today. Overall he detailed review of systems was otherwise negative and she feels her experience here was generally quite positive. "They treated me with kid gloves".  PAST MEDICAL HISTORY: Past Medical History:  Diagnosis Date  . Anemia   . Anxiety   . Arthritis   . Atrophic vaginitis   . Cancer [facility])    breast cancer  . Endometriosis   . Family history of breast cancer   . Family history of colon cancer   . GERD (gastroesophageal reflux disease)   . Headache(784.0)   . Heart murmur    as a child only  . Hyperlipidemia   . Hypertension   . Hypothyroidism   . Kidney stones   . Osteopenia   . Osteoporosis   . Rheumatic fever    age 75  . Sleep apnea    " very mild" does not wear CPAP  . Thyroid disease    Hypothyroid  . Ulcer   . UTI (lower urinary tract infection)     PAST SURGICAL HISTORY: Past Surgical History:  Procedure Laterality Date  . BREAST LUMPECTOMY WITH RADIOACTIVE  SEED AND SENTINEL LYMPH NODE BIOPSY Left 09/09/2015   Procedure: LEFT BREAST LUMPECTOMY WITH RADIOACTIVE SEED AND SENTINEL LYMPH NODE BIOPSY;  Surgeon: Jackolyn Confer, MD;  Location: [facility];  Service: General;  Laterality: Left;  . IR GENERIC HISTORICAL  12/05/2015   IR CV LINE INJECTION 12/05/2015 [name], MD [facility]-INTERV RAD  . LAPAROSCOPIC ENDOMETRIOSIS FULGURATION  1978  . PELVIC  LAPAROSCOPY    . PORTACATH PLACEMENT N/A 10/04/2015   Procedure: INSERTION PORT-A-CATH WITH ULTRASOUND;  Surgeon: Jackolyn Confer, MD;  Location: [facility];  Service: General;  Laterality: N/A;  . TONSILLECTOMY    . ULNAR NERVE REPAIR  2010    FAMILY HISTORY Family History  Problem Relation Age of Onset  . Breast cancer Mother     Age 66  . Hypertension Mother   . Heart disease Father   . Stroke Father   . Colon cancer Father     dx 45s  . Breast cancer Paternal Grandmother     Age 64  . Diabetes Paternal Grandmother   . Heart disease Maternal Grandmother   The patient's father died at age 78 the patient's mother was diagnosed with breast cancer at age 24 and again at age 70, which was the year of her death. In addition a paternal grandmother was diagnosed with breast cancer in her 12s. The patient's father had colon cancer. The patient had 2 brothers, no sisters. There is no history of ovarian cancer in the family.  GYNECOLOGIC HISTORY:  No LMP recorded. Patient is postmenopausal. Menarche age 75. The patient is GX P0. She went through menopause in her late 67s. She did not take hormone replacement. She never used oral contraceptives.  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 Her husband [name] [name]") is retired from [employer]. They have an adopted son, [name], who is completing a second Engineer, production degree at [employer] state area he carries a diagnosis of schizophrenia, but is obviously very functional. The patient has no grandchildren. She is a Nurse, learning disability.  ADVANCED DIRECTIVES:  NOT IN PLACE   HEALTH MAINTENANCE: Social History  Substance Use Topics  . Smoking status: Never Smoker  . Smokeless tobacco: Never Used  . Alcohol use No     Colonoscopy: 2012/[facility]   PAP: 2013?   Bone density: 2012/osteopenia   Lipid panel:  Allergies  Allergen Reactions  . Nitrofurantoin Nausea And Vomiting    GI upset  . Sulfamethoxazole-Trimethoprim Nausea And  Vomiting and Other (See Comments)    GI upset    Current Outpatient Prescriptions  Medication Sig Dispense Refill  . acetaminophen (TYLENOL) 500 MG tablet Take 1,000 mg by mouth every 6 (six) hours as needed (For pain.).    Marland Kitchen atenolol (TENORMIN) 50 MG tablet TAKE 1 TABLET (50 MG TOTAL) BY MOUTH DAILY. 30 tablet 5  . chlorhexidine (PERIDEX) 0.12 % solution Use as directed 15 mLs in the mouth or throat 2 (two) times daily.     . hydrochlorothiazide (HYDRODIURIL) 25 MG tablet TAKE 1 TABLET (25 MG TOTAL) BY MOUTH DAILY. (Patient taking differently: TAKE HALF A TABLET BY MOUTH DAILY.) 30 tablet 11  . HYDROcodone-acetaminophen (NORCO/VICODIN) 5-325 MG tablet Take 1-2 tablets by mouth every 4 (four) hours as needed for moderate pain or severe pain. 30 tablet 0  . levothyroxine (SYNTHROID, LEVOTHROID) 50 MCG tablet TAKE 1 TABLET BY MOUTH DAILY BEFORE BREAKFAST 30 tablet 5  . LORazepam (ATIVAN) 0.5 MG tablet Take 1 tablet (0.5 mg total) by mouth every 8 (  eight) hours. 30 tablet 2  . Multiple Vitamin (MULTIVITAMIN WITH MINERALS) TABS tablet Take 1 tablet by mouth daily.    . Naphazoline HCl (CLEAR EYES OP) Place 2 drops into both eyes daily.    Earney Navy Bicarbonate (ZEGERID) 20-1100 MG CAPS capsule Take 1 capsule by mouth daily before breakfast.    . OVER THE COUNTER MEDICATION Take 475 mg by mouth daily. Tumeric 49m    . simvastatin (ZOCOR) 40 MG tablet TAKE 1 TABLET (40 MG TOTAL) BY MOUTH AT BEDTIME. 30 tablet 5  . venlafaxine XR (EFFEXOR-XR) 150 MG 24 hr capsule Take 1 capsule (150 mg total) by mouth daily. 90 capsule 3  . zolpidem (AMBIEN) 5 MG tablet Take one to two tablets qhs prn insomnia 60 tablet 2   No current facility-administered medications for this visit.     OBJECTIVE: Middle-aged white woman In no acute distress Vitals:   12/30/15 1007  BP: (!) 104/52  Pulse: 84  Resp: 18  Temp: 97.8 F (36.6 C)     Body mass index is 27.3 kg/m.    ECOG FS:1 - Symptomatic but  completely ambulatory  Sclerae unicteric, EOMs intact Oropharynx clear and moist No cervical or supraclavicular adenopathy Lungs no rales or rhonchi Heart regular rate and rhythm Abd soft, nontender, positive bowel sounds MSK no focal spinal tenderness, no upper extremity lymphedema Neuro: nonfocal, well oriented, appropriate affect Breasts: Deferred   LAB RESULTS:  CMP     Component Value Date/Time   NA 142 12/26/2015 0925   K 4.0 12/26/2015 0925   CL 109 10/04/2015 0925   CO2 23 12/26/2015 0925   GLUCOSE 109 12/26/2015 0925   BUN 21.3 12/26/2015 0925   CREATININE 0.8 12/26/2015 0925   CALCIUM 9.6 12/26/2015 0925   PROT 7.1 12/26/2015 0925   ALBUMIN 3.6 12/26/2015 0925   AST 19 12/26/2015 0925   ALT 23 12/26/2015 0925   ALKPHOS 99 12/26/2015 0925   BILITOT 0.39 12/26/2015 0925   GFRNONAA >60 10/04/2015 0925   GFRAA >60 10/04/2015 0925    INo results found for: SPEP, UPEP  Lab Results  Component Value Date   WBC 11.9 (H) 12/30/2015   NEUTROABS 11.2 (H) 12/30/2015   HGB 11.5 (L) 12/30/2015   HCT 35.4 12/30/2015   MCV 91.3 12/30/2015   PLT 252 12/30/2015      Chemistry      Component Value Date/Time   NA 142 12/26/2015 0925   K 4.0 12/26/2015 0925   CL 109 10/04/2015 0925   CO2 23 12/26/2015 0925   BUN 21.3 12/26/2015 0925   CREATININE 0.8 12/26/2015 0925      Component Value Date/Time   CALCIUM 9.6 12/26/2015 0925   ALKPHOS 99 12/26/2015 0925   AST 19 12/26/2015 0925   ALT 23 12/26/2015 0925   BILITOT 0.39 12/26/2015 0925       No results found for: LABCA2  No components found for: [id number] No results for input(s): INR in the last 168 hours.  Urinalysis    Component Value Date/Time   BILIRUBINUR 1+ 09/28/2014 1124   PROTEINUR 2+ 09/28/2014 1124   UROBILINOGEN 1.0 09/28/2014 1124   NITRITE positive 09/28/2014 1124   LEUKOCYTESUR large (3+) (A) 09/28/2014 1124      ELIGIBLE FOR AVAILABLE RESEARCH PROTOCOL: no  STUDIES: Ir Cv Line  Injection  Result Date: 12/05/2015 INDICATION: Difficulty port access EXAM: CENTRAL VENOUS CATHETER MEDICATIONS: None. ANESTHESIA/SEDATION: None. FLUOROSCOPY TIME:  Fluoroscopy Time:  minutes 24 seconds (  19 mGy). COMPLICATIONS: None immediate. PROCEDURE: Informed written consent was obtained from the patient after a thorough discussion of the procedural risks, benefits and alternatives. All questions were addressed. Maximal Sterile Barrier Technique was utilized including caps, mask, sterile gowns, sterile gloves, sterile drape, hand hygiene and skin antiseptic. A timeout was performed prior to the initiation of the procedure. The right upper chest was prepped and draped in a sterile fashion. The [place] nerve needle was advanced through the skin and into the port reservoir. Contrast was injected. The port was then flushed and instilled with heparinized saline. FINDINGS: The port is patent and intact.  The tip is in the SVC. IMPRESSION: The port is patent.  The tip is in the SVC. Electronically Signed   By: [name] M.D.   On: 12/05/2015 15:13    ASSESSMENT: 75 y.o. Twilight woman status post left breast upper outer quadrant biopsy 08/19/2015 for a clinical T1b N0, stage IA invasive ductal carcinoma, strongly estrogen and progesterone receptor positive, HER-2 not amplified, with an MIB-1 of 10%  (1) left lumpectomy and sentinel lymph node sampling 09/09/2015 showed apT1b pN0, stage IA invasive ductal carcinoma, grade 1, repeat HER-2 again negative.   (2) Oncotype DX score of 29 predicts a 19% risk of recurrence outside the breast within the next 10 years if the patient's only systemic therapy is tamoxifen for 5 years. It also predicts a further risk reduction with chemotherapy of 8%.  (3) adjuvant chemotherapy will consist of cyclophosphamide and docetaxel every 21 days 4, started 10/24/2015, completed 12/26/2015.  (4) adjuvant radiation to follow chemotherapy  (5) anti-estrogen therapy to follow  at the completion of local treatment  (6) genetics testing 09/19/2015 through the Breast/Ovarian gene panel offered by [facility] found no deleterious mutations in ATM, BARD1, BRCA1, BRCA2, BRIP1, CDH1, CHEK2, EPCAM, FANCC, MLH1, MSH2, MSH6, NBN, PALB2, PMS2, PTEN, RAD51C, RAD51D, TP53, and XRCC2.  PLAN: Aleta has completed her adjuvant chemotherapy. She was a Nurse, mental health, put herself for all the treatments without dose reductions or delays, and generally feels the experience was a positive one, as documented above.  She understands that this is a life changing experience. She should not expect to simply return to the previous normal. Instead she will be discovering what her new normal will be. I strongly encouraged her to participate in the finding are normal group and the best time for her to participate would be in December or January after she has completed her radiation treatments and has started on her anti-estrogens.  She may have her port removed at any time now and I have sent her surgeon and note of to alert him regarding that.  I anticipate she will be finishing radiation sometime late October. Accordingly she will see me early November. At that time we will likely start anastrozole. It would be useful to have a repeat bone density prior to that visit and I will placed that order.  She knows to call for any problems that may develop before then.     :Chauncey Cruel, MD   12/30/2015 10:33 AM Medical Oncology and Hematology [facility] [place], [place] [address] Tel. [phone]    Fax. [phone]

## 2015-12-30 NOTE — Telephone Encounter (Signed)
lvm to inform pt of done density appt 10/9 at breast center

## 2015-12-30 NOTE — Telephone Encounter (Signed)
Pt called to this RN to state she feels she could benefit if she received IV fluids post MD appointment 9/15.  Appointment made with [facility] per no availability at this office.

## 2016-01-02 ENCOUNTER — Ambulatory Visit: Payer: Medicare Other | Admitting: Oncology

## 2016-01-02 ENCOUNTER — Other Ambulatory Visit: Payer: Self-pay | Admitting: *Deleted

## 2016-01-02 MED ORDER — TRAZODONE HCL 50 MG PO TABS: 50.0000 mg | ORAL_TABLET | Freq: Every day | ORAL | 4 refills | Status: DC

## 2016-01-02 NOTE — Telephone Encounter (Signed)
Pt's husband came to the office to request Rx for Trazodone. Verbal order received from Dr. [name], Rx sent electronically.

## 2016-01-12 NOTE — Progress Notes (Signed)
Location of Breast Cancer: Left Breast  Histology per Pathology Report:  08/19/15 Diagnosis Breast, left, needle core biopsy, 2:30 o'clock mass - INVASIVE MAMMARY CARCINOMA.  Receptor Status: ER(100%), PR (100%), Her2-neu (NEG), Ki-(10%)  09/09/15 Diagnosis 1. Lymph node, sentinel, biopsy, Left axillary #1 - ONE BENIGN LYMPH NODE (0/1). 2. Lymph node, sentinel, biopsy, Left axillary #2 - ONE BENIGN LYMPH NODE (0/1). 3. Breast, lumpectomy, Left - INVASIVE AND IN SITU DUCTAL CARCINOMA, 0.9 CM. - MARGINS NOT INVOLVED. - CLOSEST MARGIN ANTERIOR AT 0.3 CM. - REMAINING MARGINS GREATER THAN 1.0 CM. - BIOPSY SITE REACTION. - FIBROCYSTIC CHANGES WITH CALCIFICATIONS  Did patient present with symptoms or was this found on screening mammography?: It was found on a screening mammogram.   Past/Anticipated interventions by surgeon, if any: 09/09/15 PROCEDURE:   Left axillary lymphatic mapping. Left partial mastectomy after radioactive seed localization. Left axillary sentinel lymph node biopsy. Surgeon: Odis Hollingshead  10/04/15 Procedure: Ultrasound-guided Port-A-Cath insertion into right internal jugular vein under fluoroscopy. Surgeon: Jackolyn Confer M.D.   Past/Anticipated interventions by medical oncology, if any:  Dr. [name]:  Adjuvant chemotherapy will consist of cyclophosphamide and docetaxel every 21 days 4, started 10/24/2015, completed 12/26/2015.  Adjuvant radiation to follow chemotherapy Anti-estrogen therapy to follow at the completion of local treatment  Lymphedema issues, if any:  She denies, She has good arm mobility  Pain issues, if any: She denies  SAFETY ISSUES:  Prior radiation? No  Pacemaker/ICD? No  Possible current pregnancy? No  Is the patient on methotrexate? No  Current Complaints / other details:    BP (!) 116/49   Pulse 70   Temp 98.1 F (36.7 C)   Ht 5' 1"  (1.549 m)   Wt 143 lb (64.9 kg)   SpO2 98% Comment: room air  BMI 27.02 kg/m     Wt Readings from Last 3 Encounters:  01/18/16 143 lb (64.9 kg)  12/30/15 144 lb 8 oz (65.5 kg)  12/05/15 148 lb 8 oz (67.4 kg)      [name], Stephani Police, RN 01/12/2016,10:52 AM

## 2016-01-13 ENCOUNTER — Ambulatory Visit: Payer: Medicare Other | Admitting: Radiation Oncology

## 2016-01-13 ENCOUNTER — Ambulatory Visit: Payer: Medicare Other

## 2016-01-17 ENCOUNTER — Ambulatory Visit: Payer: Self-pay | Admitting: General Surgery

## 2016-01-17 DIAGNOSIS — C50412 Malignant neoplasm of upper-outer quadrant of left female breast: Secondary | ICD-10-CM | POA: Diagnosis not present

## 2016-01-18 ENCOUNTER — Ambulatory Visit
Admission: RE | Admit: 2016-01-18 | Discharge: 2016-01-18 | Disposition: A | Discharge status: Home / Self Care | Payer: Medicare Other | Source: Ambulatory Visit | Attending: Radiation Oncology | Admitting: Radiation Oncology

## 2016-01-18 ENCOUNTER — Encounter: Payer: Self-pay | Admitting: Radiation Oncology

## 2016-01-18 DIAGNOSIS — M8580 Other specified disorders of bone density and structure, unspecified site: Secondary | ICD-10-CM | POA: Diagnosis not present

## 2016-01-18 DIAGNOSIS — I10 Essential (primary) hypertension: Secondary | ICD-10-CM | POA: Diagnosis not present

## 2016-01-18 DIAGNOSIS — Z87442 Personal history of urinary calculi: Secondary | ICD-10-CM | POA: Insufficient documentation

## 2016-01-18 DIAGNOSIS — D649 Anemia, unspecified: Secondary | ICD-10-CM | POA: Diagnosis not present

## 2016-01-18 DIAGNOSIS — K219 Gastro-esophageal reflux disease without esophagitis: Secondary | ICD-10-CM | POA: Diagnosis not present

## 2016-01-18 DIAGNOSIS — F419 Anxiety disorder, unspecified: Secondary | ICD-10-CM | POA: Diagnosis not present

## 2016-01-18 DIAGNOSIS — Z800 Family history of malignant neoplasm of digestive organs: Secondary | ICD-10-CM | POA: Insufficient documentation

## 2016-01-18 DIAGNOSIS — Z170 Estrogen receptor positive status [ER+]: Secondary | ICD-10-CM | POA: Diagnosis not present

## 2016-01-18 DIAGNOSIS — E079 Disorder of thyroid, unspecified: Secondary | ICD-10-CM | POA: Insufficient documentation

## 2016-01-18 DIAGNOSIS — E785 Hyperlipidemia, unspecified: Secondary | ICD-10-CM | POA: Diagnosis not present

## 2016-01-18 DIAGNOSIS — E039 Hypothyroidism, unspecified: Secondary | ICD-10-CM | POA: Insufficient documentation

## 2016-01-18 DIAGNOSIS — C50412 Malignant neoplasm of upper-outer quadrant of left female breast: Secondary | ICD-10-CM

## 2016-01-18 DIAGNOSIS — Z803 Family history of malignant neoplasm of breast: Secondary | ICD-10-CM | POA: Diagnosis not present

## 2016-01-18 NOTE — Progress Notes (Signed)
Radiation Oncology         [phone] ________________________________  Name: [name] MRN: [id number]  Date: 01/18/2016  DOB: 02/03/1941  Follow-Up Visit Note  Outpatient  CC: [name], MD  [name], [name], MD  Diagnosis:      ICD-9-CM ICD-10-CM   1. Malignant neoplasm of upper-outer quadrant of left female breast, unspecified estrogen receptor status ([place]) 174.4 C50.412     Stage IA (pT1b, pN0) invasive ductal carcinoma of the left breast, UOQ, grade 1 (ER+, PR+, HER2-)  Narrative:  The patient returns today for follow-up. She was initially seen by myself for a consultation on 09/07/15.   Since consultation, the patient underwent a left breast lumpectomy and SLN biopsy on 09/09/15 by Dr. Zella Richer. This revealed grade 1 invasive and in situ ductal carcinoma with the tumor spanning 0.9 cm and clear margins (the closest margin is anterior at 0.3 cm). 2 biopsied left axillary SLNs were negative for metastatic disease.  The patient saw Genetics on 09/23/15. Genetic testing was normal, and did not reveal a deleterious mutation in these genes.  Oncotype DX score was 29; the high end of intermediate risk with a 10-year risk of distant recurrence of 19% with Tamoxifen alone. Dr. [name] recommended adjuvant chemotherapy consisting of cyclophosphamide (Cytoxan) and docetaxel (Taxotere) to start on 10/24/2015, repeated every 21 days 4.  The patient completed chemotherapy on 12/26/15.  The patient and her husband present today to discuss the role of radiation in the management of her disease.  On review of systems, the patient reports good arm mobility and denies pain.  ALLERGIES:  is allergic to nitrofurantoin and sulfamethoxazole-trimethoprim.  Meds: Current Outpatient Prescriptions  Medication Sig Dispense Refill  . acetaminophen (TYLENOL) 500 MG tablet Take 1,000 mg by mouth every 6 (six) hours as needed (For pain.).    Marland Kitchen atenolol (TENORMIN) 50 MG tablet TAKE 1  TABLET (50 MG TOTAL) BY MOUTH DAILY. 30 tablet 5  . chlorhexidine (PERIDEX) 0.12 % solution Use as directed 15 mLs in the mouth or throat 2 (two) times daily.     . hydrochlorothiazide (HYDRODIURIL) 25 MG tablet TAKE 1 TABLET (25 MG TOTAL) BY MOUTH DAILY. (Patient taking differently: TAKE HALF A TABLET BY MOUTH DAILY.) 30 tablet 11  . HYDROcodone-acetaminophen (NORCO/VICODIN) 5-325 MG tablet Take 1-2 tablets by mouth every 4 (four) hours as needed for moderate pain or severe pain. 30 tablet 0  . levothyroxine (SYNTHROID, LEVOTHROID) 50 MCG tablet TAKE 1 TABLET BY MOUTH DAILY BEFORE BREAKFAST 30 tablet 5  . LORazepam (ATIVAN) 0.5 MG tablet Take 1 tablet (0.5 mg total) by mouth every 8 (eight) hours. 30 tablet 2  . Multiple Vitamin (MULTIVITAMIN WITH MINERALS) TABS tablet Take 1 tablet by mouth daily.    . Naphazoline HCl (CLEAR EYES OP) Place 2 drops into both eyes daily.    Earney Navy Bicarbonate (ZEGERID) 20-1100 MG CAPS capsule Take 1 capsule by mouth daily before breakfast.    . OVER THE COUNTER MEDICATION Take 475 mg by mouth daily. Tumeric 434m    . simvastatin (ZOCOR) 40 MG tablet TAKE 1 TABLET (40 MG TOTAL) BY MOUTH AT BEDTIME. 30 tablet 5  . traZODone (DESYREL) 50 MG tablet Take 1 tablet (50 mg total) by mouth at bedtime. 30 tablet 4  . venlafaxine XR (EFFEXOR-XR) 150 MG 24 hr capsule Take 1 capsule (150 mg total) by mouth daily. 90 capsule 3   No current facility-administered medications for this encounter.     Physical  Findings:  height is 5' 1" (1.549 m) and weight is 143 lb (64.9 kg). Her temperature is 98.1 F (36.7 C). Her blood pressure is 116/49 (abnormal) and her pulse is 70. Her oxygen saturation is 98%. .     General: Alert and oriented, in no acute distress HEENT: Head is normocephalic. Extraocular movements are intact. Oropharynx is clear. She is wearing a wig. Neck: Neck is supple, no palpable cervical or supraclavicular lymphadenopathy. Heart: Regular in rate  and rhythm with no murmurs, rubs, or gallops. Chest: Clear to auscultation bilaterally, with no rhonchi, wheezes, or rales. Abdomen: Soft, nontender, nondistended, with no rigidity or guarding. Extremities: No cyanosis or edema. Lymphatics: see Neck Exam Musculoskeletal: symmetric strength and muscle tone throughout. Neurologic: No obvious focalities. Speech is fluent.  Psychiatric: Judgment and insight are intact. Affect is appropriate. Breast exam reveals no palpable masses in her breast bilaterally. Surgical scars have healed well. Skin: no concerning lesions  Lab Findings: Lab Results  Component Value Date   WBC 11.9 (H) 12/30/2015   HGB 11.5 (L) 12/30/2015   HCT 35.4 12/30/2015   MCV 91.3 12/30/2015   PLT 252 12/30/2015   Radiographic Findings: No results found.  Impression/Plan: Stage I left breast cancer We discussed adjuvant radiotherapy today.  I recommend 5 weeks of radiation therapy to the left breast in order to reduce the risk of locoregional recurrence by 2/3rds.  The risks, benefits and side effects of this treatment were discussed in detail.  She understands that radiotherapy is associated with skin irritation and fatigue in the acute setting. Late effects can include cosmetic changes and rare injury to internal organs.   She is enthusiastic about proceeding with treatment. A consent form has been signed and placed in her chart. CT simulation is scheduled on 01/20/16.    Eppie Gibson, MD  This document serves as a record of services personally performed by Eppie Gibson, MD. It was created on her behalf by Darcus Austin, a trained medical scribe. The creation of this record is based on the scribe's personal observations and the provider's statements to them. This document has been checked and approved by the attending provider.

## 2016-01-20 ENCOUNTER — Ambulatory Visit
Admission: RE | Admit: 2016-01-20 | Discharge: 2016-01-20 | Disposition: A | Discharge status: Home / Self Care | Payer: Medicare Other | Source: Ambulatory Visit | Attending: Radiation Oncology | Admitting: Radiation Oncology

## 2016-01-20 DIAGNOSIS — Z803 Family history of malignant neoplasm of breast: Secondary | ICD-10-CM | POA: Diagnosis not present

## 2016-01-20 DIAGNOSIS — M8580 Other specified disorders of bone density and structure, unspecified site: Secondary | ICD-10-CM | POA: Diagnosis not present

## 2016-01-20 DIAGNOSIS — K219 Gastro-esophageal reflux disease without esophagitis: Secondary | ICD-10-CM | POA: Diagnosis not present

## 2016-01-20 DIAGNOSIS — Z87442 Personal history of urinary calculi: Secondary | ICD-10-CM | POA: Diagnosis not present

## 2016-01-20 DIAGNOSIS — Z800 Family history of malignant neoplasm of digestive organs: Secondary | ICD-10-CM | POA: Diagnosis not present

## 2016-01-20 DIAGNOSIS — C50412 Malignant neoplasm of upper-outer quadrant of left female breast: Secondary | ICD-10-CM | POA: Diagnosis not present

## 2016-01-20 DIAGNOSIS — D649 Anemia, unspecified: Secondary | ICD-10-CM | POA: Diagnosis not present

## 2016-01-20 DIAGNOSIS — Z170 Estrogen receptor positive status [ER+]: Secondary | ICD-10-CM

## 2016-01-20 DIAGNOSIS — E079 Disorder of thyroid, unspecified: Secondary | ICD-10-CM | POA: Diagnosis not present

## 2016-01-20 DIAGNOSIS — E039 Hypothyroidism, unspecified: Secondary | ICD-10-CM | POA: Diagnosis not present

## 2016-01-20 DIAGNOSIS — E785 Hyperlipidemia, unspecified: Secondary | ICD-10-CM | POA: Diagnosis not present

## 2016-01-20 DIAGNOSIS — I10 Essential (primary) hypertension: Secondary | ICD-10-CM | POA: Diagnosis not present

## 2016-01-20 NOTE — Progress Notes (Signed)
Radiation Oncology         [phone] ________________________________  Name: [name] MRN: [id number]  Date: 01/20/2016  DOB: Jan 25, 1941  SIMULATION AND TREATMENT PLANNING NOTE / Special treatment procedure  Outpatient  DIAGNOSIS:     ICD-9-CM ICD-10-CM   1. Malignant neoplasm of upper-outer quadrant of left breast in female, estrogen receptor positive ([place]) 174.4 C50.412    V86.0 Z17.0     NARRATIVE:  The patient was brought to the [facility].  Identity was confirmed.  All relevant records and images related to the planned course of therapy were reviewed.  The patient freely provided informed written consent to proceed with treatment after reviewing the details related to the planned course of therapy. The consent form was witnessed and verified by the simulation staff.    Then, the patient was set-up in a stable reproducible supine position for radiation therapy with her ipsilateral arm over her head, and her upper body secured in a custom-made Vac-lok device.  CT images were obtained.  Surface markings were placed.  The CT images were loaded into the planning software.    Special treatment procedure was performed today due to the extra time and effort required by myself to plan and prepare this patient for deep inspiration breath hold technique.  I have determined cardiac sparing to be of benefit to this patient to prevent long term cardiac damage due to radiation of the heart.  Bellows were placed on the patient's abdomen. To facilitate cardiac sparing, the patient was coached by the radiation therapists on breath hold techniques and breathing practice was performed. Practice waveforms were obtained. The patient was then scanned while maintaining breath hold in the treatment position.  This image was then transferred over to the imaging specialist. The imaging specialist then created a fusion of the free breathing and breath hold scans using the chest wall as the  stable structure. I personally reviewed the fusion in axial, coronal and sagittal image planes.  Excellent cardiac sparing was obtained.  I felt the patient is an appropriate candidate for breath hold and the patient will be treated as such.  The image fusion was then reviewed with the patient to reinforce the necessity of reproducible breath hold.   TREATMENT PLANNING NOTE: Treatment planning then occurred.  The radiation prescription was entered and confirmed.     A total of 3 medically necessary complex treatment devices were fabricated and supervised by me: 2 fields with MLCs for custom blocks to protect heart, and lungs;  and, a Vac-lok. MORE COMPLEX DEVICES MAY BE MADE IN DOSIMETRY FOR FIELD IN FIELD BEAMS FOR DOSE HOMOGENEITY.  I have requested : 3D Simulation  I have requested a DVH of the following structures: lungs, heart, lumpectomy cavity.    The patient will receive 50 Gy in 25 fractions to the left with 2 tangential fields.   This will not be followed by a boost.  Optical Surface Tracking Plan:  Since intensity modulated radiotherapy (IMRT) and 3D conformal radiation treatment methods are predicated on accurate and precise positioning for treatment, intrafraction motion monitoring is medically necessary to ensure accurate and safe treatment delivery. The ability to quantify intrafraction motion without excessive ionizing radiation dose can only be performed with optical surface tracking. Accordingly, surface imaging offers the opportunity to obtain 3D measurements of patient position throughout IMRT and 3D treatments without excessive radiation exposure. I am ordering optical surface tracking for this patient's upcoming course of radiotherapy.  ________________________________  Reference:  Ursula Alert, J, et al. Surface imaging-based analysis of intrafraction motion for breast radiotherapy patients.Journal of [place], n. 6, nov. 2014.  ISSN [id number].  Available at: <http://www.jacmp.org/index.php/jacmp/article/view/4957>.    -----------------------------------  Eppie Gibson, MD

## 2016-01-23 ENCOUNTER — Ambulatory Visit
Admission: RE | Admit: 2016-01-23 | Discharge: 2016-01-23 | Disposition: A | Discharge status: Home / Self Care | Payer: Medicare Other | Source: Ambulatory Visit | Attending: Oncology | Admitting: Oncology

## 2016-01-23 DIAGNOSIS — M8580 Other specified disorders of bone density and structure, unspecified site: Secondary | ICD-10-CM

## 2016-01-23 DIAGNOSIS — M8589 Other specified disorders of bone density and structure, multiple sites: Secondary | ICD-10-CM | POA: Diagnosis not present

## 2016-01-23 DIAGNOSIS — C50412 Malignant neoplasm of upper-outer quadrant of left female breast: Secondary | ICD-10-CM

## 2016-01-23 DIAGNOSIS — Z780 Asymptomatic menopausal state: Secondary | ICD-10-CM | POA: Diagnosis not present

## 2016-01-27 ENCOUNTER — Ambulatory Visit
Admission: RE | Admit: 2016-01-27 | Discharge: 2016-01-27 | Disposition: A | Discharge status: Home / Self Care | Payer: Medicare Other | Source: Ambulatory Visit | Attending: Radiation Oncology | Admitting: Radiation Oncology

## 2016-01-27 DIAGNOSIS — D649 Anemia, unspecified: Secondary | ICD-10-CM | POA: Diagnosis not present

## 2016-01-27 DIAGNOSIS — C50412 Malignant neoplasm of upper-outer quadrant of left female breast: Secondary | ICD-10-CM | POA: Diagnosis not present

## 2016-01-27 DIAGNOSIS — E079 Disorder of thyroid, unspecified: Secondary | ICD-10-CM | POA: Diagnosis not present

## 2016-01-27 DIAGNOSIS — M8580 Other specified disorders of bone density and structure, unspecified site: Secondary | ICD-10-CM | POA: Diagnosis not present

## 2016-01-27 DIAGNOSIS — K219 Gastro-esophageal reflux disease without esophagitis: Secondary | ICD-10-CM | POA: Diagnosis not present

## 2016-01-27 DIAGNOSIS — E039 Hypothyroidism, unspecified: Secondary | ICD-10-CM | POA: Diagnosis not present

## 2016-01-27 DIAGNOSIS — Z800 Family history of malignant neoplasm of digestive organs: Secondary | ICD-10-CM | POA: Diagnosis not present

## 2016-01-27 DIAGNOSIS — E785 Hyperlipidemia, unspecified: Secondary | ICD-10-CM | POA: Diagnosis not present

## 2016-01-27 DIAGNOSIS — Z803 Family history of malignant neoplasm of breast: Secondary | ICD-10-CM | POA: Diagnosis not present

## 2016-01-27 DIAGNOSIS — I10 Essential (primary) hypertension: Secondary | ICD-10-CM | POA: Diagnosis not present

## 2016-01-27 DIAGNOSIS — Z87442 Personal history of urinary calculi: Secondary | ICD-10-CM | POA: Diagnosis not present

## 2016-01-30 ENCOUNTER — Encounter: Payer: Self-pay | Admitting: Radiation Oncology

## 2016-01-30 ENCOUNTER — Ambulatory Visit: Admission: RE | Admit: 2016-01-30 | Payer: Medicare Other | Source: Ambulatory Visit

## 2016-01-30 ENCOUNTER — Ambulatory Visit
Admission: RE | Admit: 2016-01-30 | Discharge: 2016-01-30 | Disposition: A | Discharge status: Home / Self Care | Payer: Medicare Other | Source: Ambulatory Visit | Attending: Radiation Oncology | Admitting: Radiation Oncology

## 2016-01-30 VITALS — BP 102/64 | HR 76 | Temp 98.4°F | Resp 18 | Ht 61.0 in | Wt 145.4 lb

## 2016-01-30 DIAGNOSIS — E785 Hyperlipidemia, unspecified: Secondary | ICD-10-CM | POA: Diagnosis not present

## 2016-01-30 DIAGNOSIS — C50412 Malignant neoplasm of upper-outer quadrant of left female breast: Secondary | ICD-10-CM

## 2016-01-30 DIAGNOSIS — Z923 Personal history of irradiation: Secondary | ICD-10-CM

## 2016-01-30 DIAGNOSIS — Z170 Estrogen receptor positive status [ER+]: Secondary | ICD-10-CM | POA: Insufficient documentation

## 2016-01-30 DIAGNOSIS — Z800 Family history of malignant neoplasm of digestive organs: Secondary | ICD-10-CM | POA: Diagnosis not present

## 2016-01-30 DIAGNOSIS — D649 Anemia, unspecified: Secondary | ICD-10-CM | POA: Diagnosis not present

## 2016-01-30 DIAGNOSIS — E039 Hypothyroidism, unspecified: Secondary | ICD-10-CM | POA: Diagnosis not present

## 2016-01-30 DIAGNOSIS — K219 Gastro-esophageal reflux disease without esophagitis: Secondary | ICD-10-CM | POA: Diagnosis not present

## 2016-01-30 DIAGNOSIS — I10 Essential (primary) hypertension: Secondary | ICD-10-CM | POA: Diagnosis not present

## 2016-01-30 DIAGNOSIS — Z803 Family history of malignant neoplasm of breast: Secondary | ICD-10-CM | POA: Diagnosis not present

## 2016-01-30 DIAGNOSIS — M8580 Other specified disorders of bone density and structure, unspecified site: Secondary | ICD-10-CM | POA: Diagnosis not present

## 2016-01-30 DIAGNOSIS — Z87442 Personal history of urinary calculi: Secondary | ICD-10-CM | POA: Diagnosis not present

## 2016-01-30 DIAGNOSIS — Z510 Encounter for antineoplastic radiation therapy: Secondary | ICD-10-CM | POA: Insufficient documentation

## 2016-01-30 DIAGNOSIS — E079 Disorder of thyroid, unspecified: Secondary | ICD-10-CM | POA: Diagnosis not present

## 2016-01-30 MED ORDER — ALRA NON-METALLIC DEODORANT (RAD-ONC)
1.0000 "application " | Freq: Once | TOPICAL | Status: AC
  Administered 2016-01-30: 1 via TOPICAL

## 2016-01-30 MED ORDER — RADIAPLEXRX EX GEL
Freq: Once | CUTANEOUS | Status: AC
  Administered 2016-01-30: 15:00:00 via TOPICAL

## 2016-01-30 NOTE — Progress Notes (Signed)
   Weekly Management Note:  Outpatient    ICD-9-CM ICD-10-CM   1. Malignant neoplasm of upper-outer quadrant of left breast in female, estrogen receptor positive ([facility]) 174.4 C50.412 hyaluronate sodium (RADIAPLEXRX) gel   [id number] non-metallic deodorant (ALRA) 1 application    Current Dose:  2 Gy  Projected Dose: 50 Gy   Narrative:  The patient presents for routine under treatment assessment.  CBCT/MVCT images/Port film x-rays were reviewed.  The chart was checked. Doing well.  Physical Findings:  height is 5\' 1"  (1.549 m) and weight is 145 lb 6.4 oz (66 kg). Her oral temperature is 98.4 F (36.9 C). Her blood pressure is 102/64 and her pulse is 76. Her respiration is 18 and oxygen saturation is 99%.   Wt Readings from Last 3 Encounters:  01/30/16 145 lb 6.4 oz (66 kg)  01/18/16 143 lb (64.9 kg)  12/30/15 144 lb 8 oz (65.5 kg)   NAD, no skin irritation over left breast  Impression:  The patient is tolerating radiotherapy.  Plan:  Continue radiotherapy as planned. Patient instructed to apply Radiplex to intact skin in treatment fields.      ________________________________   Eppie Gibson, M.D.

## 2016-01-30 NOTE — Addendum Note (Signed)
Encounter addended by: Ernst Spell, RN on: 01/30/2016  2:32 PM<BR>    Actions taken: MAR administration accepted

## 2016-01-30 NOTE — Progress Notes (Signed)
Mrs. Huibregtse has received one fraction to her left breast.  Skin to left breast has normal color.  Radiaplex gel given with instructions and Alra deodorant.   Appetite is good.  Denies pain today.  Patient teaching done gave Radiaplex gel and deodorant with instructions, see education area for documentation. Wt Readings from Last 3 Encounters:  01/30/16 145 lb 6.4 oz (66 kg)  01/18/16 143 lb (64.9 kg)  12/30/15 144 lb 8 oz (65.5 kg)  BP 102/64 (BP Location: Left Arm, Patient Position: Sitting, Cuff Size: Normal)   Pulse 76   Temp 98.4 F (36.9 C) (Oral)   Resp 18   Ht 5\' 1"  (1.549 m)   Wt 145 lb 6.4 oz (66 kg)   SpO2 99%   BMI 27.47 kg/m

## 2016-01-31 ENCOUNTER — Other Ambulatory Visit: Payer: Self-pay | Admitting: Family Medicine

## 2016-01-31 ENCOUNTER — Ambulatory Visit
Admission: RE | Admit: 2016-01-31 | Discharge: 2016-01-31 | Disposition: A | Discharge status: Home / Self Care | Payer: Medicare Other | Source: Ambulatory Visit | Attending: Radiation Oncology | Admitting: Radiation Oncology

## 2016-01-31 DIAGNOSIS — Z800 Family history of malignant neoplasm of digestive organs: Secondary | ICD-10-CM | POA: Diagnosis not present

## 2016-01-31 DIAGNOSIS — E079 Disorder of thyroid, unspecified: Secondary | ICD-10-CM | POA: Diagnosis not present

## 2016-01-31 DIAGNOSIS — D649 Anemia, unspecified: Secondary | ICD-10-CM | POA: Diagnosis not present

## 2016-01-31 DIAGNOSIS — I10 Essential (primary) hypertension: Secondary | ICD-10-CM | POA: Diagnosis not present

## 2016-01-31 DIAGNOSIS — C50412 Malignant neoplasm of upper-outer quadrant of left female breast: Secondary | ICD-10-CM | POA: Diagnosis not present

## 2016-01-31 DIAGNOSIS — Z803 Family history of malignant neoplasm of breast: Secondary | ICD-10-CM | POA: Diagnosis not present

## 2016-01-31 DIAGNOSIS — E039 Hypothyroidism, unspecified: Secondary | ICD-10-CM | POA: Diagnosis not present

## 2016-01-31 DIAGNOSIS — Z87442 Personal history of urinary calculi: Secondary | ICD-10-CM | POA: Diagnosis not present

## 2016-01-31 DIAGNOSIS — K219 Gastro-esophageal reflux disease without esophagitis: Secondary | ICD-10-CM | POA: Diagnosis not present

## 2016-01-31 DIAGNOSIS — E785 Hyperlipidemia, unspecified: Secondary | ICD-10-CM | POA: Diagnosis not present

## 2016-01-31 DIAGNOSIS — M8580 Other specified disorders of bone density and structure, unspecified site: Secondary | ICD-10-CM | POA: Diagnosis not present

## 2016-02-01 ENCOUNTER — Ambulatory Visit
Admission: RE | Admit: 2016-02-01 | Discharge: 2016-02-01 | Disposition: A | Discharge status: Home / Self Care | Payer: Medicare Other | Source: Ambulatory Visit | Attending: Radiation Oncology | Admitting: Radiation Oncology

## 2016-02-01 DIAGNOSIS — E785 Hyperlipidemia, unspecified: Secondary | ICD-10-CM | POA: Diagnosis not present

## 2016-02-01 DIAGNOSIS — C50412 Malignant neoplasm of upper-outer quadrant of left female breast: Secondary | ICD-10-CM | POA: Diagnosis not present

## 2016-02-01 DIAGNOSIS — I10 Essential (primary) hypertension: Secondary | ICD-10-CM | POA: Diagnosis not present

## 2016-02-01 DIAGNOSIS — D649 Anemia, unspecified: Secondary | ICD-10-CM | POA: Diagnosis not present

## 2016-02-01 DIAGNOSIS — M8580 Other specified disorders of bone density and structure, unspecified site: Secondary | ICD-10-CM | POA: Diagnosis not present

## 2016-02-01 DIAGNOSIS — Z803 Family history of malignant neoplasm of breast: Secondary | ICD-10-CM | POA: Diagnosis not present

## 2016-02-01 DIAGNOSIS — K219 Gastro-esophageal reflux disease without esophagitis: Secondary | ICD-10-CM | POA: Diagnosis not present

## 2016-02-01 DIAGNOSIS — E079 Disorder of thyroid, unspecified: Secondary | ICD-10-CM | POA: Diagnosis not present

## 2016-02-01 DIAGNOSIS — Z87442 Personal history of urinary calculi: Secondary | ICD-10-CM | POA: Diagnosis not present

## 2016-02-01 DIAGNOSIS — Z800 Family history of malignant neoplasm of digestive organs: Secondary | ICD-10-CM | POA: Diagnosis not present

## 2016-02-01 DIAGNOSIS — E039 Hypothyroidism, unspecified: Secondary | ICD-10-CM | POA: Diagnosis not present

## 2016-02-02 ENCOUNTER — Ambulatory Visit
Admission: RE | Admit: 2016-02-02 | Discharge: 2016-02-02 | Disposition: A | Discharge status: Home / Self Care | Payer: Medicare Other | Source: Ambulatory Visit | Attending: Radiation Oncology | Admitting: Radiation Oncology

## 2016-02-02 DIAGNOSIS — Z87442 Personal history of urinary calculi: Secondary | ICD-10-CM | POA: Diagnosis not present

## 2016-02-02 DIAGNOSIS — Z800 Family history of malignant neoplasm of digestive organs: Secondary | ICD-10-CM | POA: Diagnosis not present

## 2016-02-02 DIAGNOSIS — D649 Anemia, unspecified: Secondary | ICD-10-CM | POA: Diagnosis not present

## 2016-02-02 DIAGNOSIS — I10 Essential (primary) hypertension: Secondary | ICD-10-CM | POA: Diagnosis not present

## 2016-02-02 DIAGNOSIS — M8580 Other specified disorders of bone density and structure, unspecified site: Secondary | ICD-10-CM | POA: Diagnosis not present

## 2016-02-02 DIAGNOSIS — E785 Hyperlipidemia, unspecified: Secondary | ICD-10-CM | POA: Diagnosis not present

## 2016-02-02 DIAGNOSIS — E039 Hypothyroidism, unspecified: Secondary | ICD-10-CM | POA: Diagnosis not present

## 2016-02-02 DIAGNOSIS — Z803 Family history of malignant neoplasm of breast: Secondary | ICD-10-CM | POA: Diagnosis not present

## 2016-02-02 DIAGNOSIS — C50412 Malignant neoplasm of upper-outer quadrant of left female breast: Secondary | ICD-10-CM | POA: Diagnosis not present

## 2016-02-02 DIAGNOSIS — K219 Gastro-esophageal reflux disease without esophagitis: Secondary | ICD-10-CM | POA: Diagnosis not present

## 2016-02-02 DIAGNOSIS — E079 Disorder of thyroid, unspecified: Secondary | ICD-10-CM | POA: Diagnosis not present

## 2016-02-03 ENCOUNTER — Ambulatory Visit
Admission: RE | Admit: 2016-02-03 | Discharge: 2016-02-03 | Disposition: A | Discharge status: Home / Self Care | Payer: Medicare Other | Source: Ambulatory Visit | Attending: Radiation Oncology | Admitting: Radiation Oncology

## 2016-02-03 DIAGNOSIS — Z803 Family history of malignant neoplasm of breast: Secondary | ICD-10-CM | POA: Diagnosis not present

## 2016-02-03 DIAGNOSIS — I10 Essential (primary) hypertension: Secondary | ICD-10-CM | POA: Diagnosis not present

## 2016-02-03 DIAGNOSIS — C50412 Malignant neoplasm of upper-outer quadrant of left female breast: Secondary | ICD-10-CM | POA: Diagnosis not present

## 2016-02-03 DIAGNOSIS — E785 Hyperlipidemia, unspecified: Secondary | ICD-10-CM | POA: Diagnosis not present

## 2016-02-03 DIAGNOSIS — E079 Disorder of thyroid, unspecified: Secondary | ICD-10-CM | POA: Diagnosis not present

## 2016-02-03 DIAGNOSIS — K219 Gastro-esophageal reflux disease without esophagitis: Secondary | ICD-10-CM | POA: Diagnosis not present

## 2016-02-03 DIAGNOSIS — E039 Hypothyroidism, unspecified: Secondary | ICD-10-CM | POA: Diagnosis not present

## 2016-02-03 DIAGNOSIS — Z800 Family history of malignant neoplasm of digestive organs: Secondary | ICD-10-CM | POA: Diagnosis not present

## 2016-02-03 DIAGNOSIS — D649 Anemia, unspecified: Secondary | ICD-10-CM | POA: Diagnosis not present

## 2016-02-03 DIAGNOSIS — Z87442 Personal history of urinary calculi: Secondary | ICD-10-CM | POA: Diagnosis not present

## 2016-02-03 DIAGNOSIS — M8580 Other specified disorders of bone density and structure, unspecified site: Secondary | ICD-10-CM | POA: Diagnosis not present

## 2016-02-06 ENCOUNTER — Ambulatory Visit
Admission: RE | Admit: 2016-02-06 | Discharge: 2016-02-06 | Disposition: A | Discharge status: Home / Self Care | Payer: Medicare Other | Source: Ambulatory Visit | Attending: Radiation Oncology | Admitting: Radiation Oncology

## 2016-02-06 DIAGNOSIS — Z170 Estrogen receptor positive status [ER+]: Secondary | ICD-10-CM

## 2016-02-06 DIAGNOSIS — E079 Disorder of thyroid, unspecified: Secondary | ICD-10-CM | POA: Diagnosis not present

## 2016-02-06 DIAGNOSIS — Z800 Family history of malignant neoplasm of digestive organs: Secondary | ICD-10-CM | POA: Diagnosis not present

## 2016-02-06 DIAGNOSIS — Z87442 Personal history of urinary calculi: Secondary | ICD-10-CM | POA: Diagnosis not present

## 2016-02-06 DIAGNOSIS — E039 Hypothyroidism, unspecified: Secondary | ICD-10-CM | POA: Diagnosis not present

## 2016-02-06 DIAGNOSIS — Z853 Personal history of malignant neoplasm of breast: Secondary | ICD-10-CM | POA: Diagnosis not present

## 2016-02-06 DIAGNOSIS — Z803 Family history of malignant neoplasm of breast: Secondary | ICD-10-CM | POA: Diagnosis not present

## 2016-02-06 DIAGNOSIS — I10 Essential (primary) hypertension: Secondary | ICD-10-CM | POA: Diagnosis not present

## 2016-02-06 DIAGNOSIS — D649 Anemia, unspecified: Secondary | ICD-10-CM | POA: Diagnosis not present

## 2016-02-06 DIAGNOSIS — Z452 Encounter for adjustment and management of vascular access device: Secondary | ICD-10-CM | POA: Diagnosis not present

## 2016-02-06 DIAGNOSIS — M8580 Other specified disorders of bone density and structure, unspecified site: Secondary | ICD-10-CM | POA: Diagnosis not present

## 2016-02-06 DIAGNOSIS — E785 Hyperlipidemia, unspecified: Secondary | ICD-10-CM | POA: Diagnosis not present

## 2016-02-06 DIAGNOSIS — C50412 Malignant neoplasm of upper-outer quadrant of left female breast: Secondary | ICD-10-CM

## 2016-02-06 DIAGNOSIS — K219 Gastro-esophageal reflux disease without esophagitis: Secondary | ICD-10-CM | POA: Diagnosis not present

## 2016-02-06 NOTE — Progress Notes (Signed)
   Weekly Management Note:  Outpatient    ICD-9-CM ICD-10-CM   1. Malignant neoplasm of upper-outer quadrant of left breast in female, estrogen receptor positive ([facility]) 174.4 C50.412    V86.0 Z17.0     Current Dose:  12 Gy  Projected Dose: 50 Gy   Narrative:  The patient presents for routine under treatment assessment.  CBCT/MVCT images/Port film x-rays were reviewed.  The chart was checked. Doing well. Seen at machine rather than in nursing as patient is in a rush to make appt to have port a cath removed  Physical Findings:  vitals were not taken for this visit.  Wt Readings from Last 3 Encounters:  01/30/16 145 lb 6.4 oz (66 kg)  01/18/16 143 lb (64.9 kg)  12/30/15 144 lb 8 oz (65.5 kg)   NAD, no skin irritation over left breast Some concavity at lumpectomy site  Impression:  The patient is tolerating radiotherapy.  Plan:  Continue radiotherapy as planned. Patient instructed to apply Radiplex to intact skin in treatment fields.      ________________________________   Eppie Gibson, M.D.

## 2016-02-07 ENCOUNTER — Ambulatory Visit
Admission: RE | Admit: 2016-02-07 | Discharge: 2016-02-07 | Disposition: A | Discharge status: Home / Self Care | Payer: Medicare Other | Source: Ambulatory Visit | Attending: Radiation Oncology | Admitting: Radiation Oncology

## 2016-02-07 DIAGNOSIS — D649 Anemia, unspecified: Secondary | ICD-10-CM | POA: Diagnosis not present

## 2016-02-07 DIAGNOSIS — E785 Hyperlipidemia, unspecified: Secondary | ICD-10-CM | POA: Diagnosis not present

## 2016-02-07 DIAGNOSIS — I10 Essential (primary) hypertension: Secondary | ICD-10-CM | POA: Diagnosis not present

## 2016-02-07 DIAGNOSIS — Z803 Family history of malignant neoplasm of breast: Secondary | ICD-10-CM | POA: Diagnosis not present

## 2016-02-07 DIAGNOSIS — K219 Gastro-esophageal reflux disease without esophagitis: Secondary | ICD-10-CM | POA: Diagnosis not present

## 2016-02-07 DIAGNOSIS — M8580 Other specified disorders of bone density and structure, unspecified site: Secondary | ICD-10-CM | POA: Diagnosis not present

## 2016-02-07 DIAGNOSIS — C50412 Malignant neoplasm of upper-outer quadrant of left female breast: Secondary | ICD-10-CM | POA: Diagnosis not present

## 2016-02-07 DIAGNOSIS — E079 Disorder of thyroid, unspecified: Secondary | ICD-10-CM | POA: Diagnosis not present

## 2016-02-07 DIAGNOSIS — Z800 Family history of malignant neoplasm of digestive organs: Secondary | ICD-10-CM | POA: Diagnosis not present

## 2016-02-07 DIAGNOSIS — E039 Hypothyroidism, unspecified: Secondary | ICD-10-CM | POA: Diagnosis not present

## 2016-02-07 DIAGNOSIS — Z87442 Personal history of urinary calculi: Secondary | ICD-10-CM | POA: Diagnosis not present

## 2016-02-08 ENCOUNTER — Encounter: Payer: Self-pay | Admitting: *Deleted

## 2016-02-08 ENCOUNTER — Ambulatory Visit
Admission: RE | Admit: 2016-02-08 | Discharge: 2016-02-08 | Disposition: A | Discharge status: Home / Self Care | Payer: Medicare Other | Source: Ambulatory Visit | Attending: Radiation Oncology | Admitting: Radiation Oncology

## 2016-02-08 DIAGNOSIS — E079 Disorder of thyroid, unspecified: Secondary | ICD-10-CM | POA: Diagnosis not present

## 2016-02-08 DIAGNOSIS — D649 Anemia, unspecified: Secondary | ICD-10-CM | POA: Diagnosis not present

## 2016-02-08 DIAGNOSIS — K219 Gastro-esophageal reflux disease without esophagitis: Secondary | ICD-10-CM | POA: Diagnosis not present

## 2016-02-08 DIAGNOSIS — E785 Hyperlipidemia, unspecified: Secondary | ICD-10-CM | POA: Diagnosis not present

## 2016-02-08 DIAGNOSIS — Z87442 Personal history of urinary calculi: Secondary | ICD-10-CM | POA: Diagnosis not present

## 2016-02-08 DIAGNOSIS — E039 Hypothyroidism, unspecified: Secondary | ICD-10-CM | POA: Diagnosis not present

## 2016-02-08 DIAGNOSIS — C50412 Malignant neoplasm of upper-outer quadrant of left female breast: Secondary | ICD-10-CM | POA: Diagnosis not present

## 2016-02-08 DIAGNOSIS — I10 Essential (primary) hypertension: Secondary | ICD-10-CM | POA: Diagnosis not present

## 2016-02-08 DIAGNOSIS — Z803 Family history of malignant neoplasm of breast: Secondary | ICD-10-CM | POA: Diagnosis not present

## 2016-02-08 DIAGNOSIS — M8580 Other specified disorders of bone density and structure, unspecified site: Secondary | ICD-10-CM | POA: Diagnosis not present

## 2016-02-08 DIAGNOSIS — Z800 Family history of malignant neoplasm of digestive organs: Secondary | ICD-10-CM | POA: Diagnosis not present

## 2016-02-08 NOTE — Progress Notes (Signed)
QI encounter 

## 2016-02-09 ENCOUNTER — Ambulatory Visit
Admission: RE | Admit: 2016-02-09 | Discharge: 2016-02-09 | Disposition: A | Discharge status: Home / Self Care | Payer: Medicare Other | Source: Ambulatory Visit | Attending: Radiation Oncology | Admitting: Radiation Oncology

## 2016-02-09 DIAGNOSIS — D649 Anemia, unspecified: Secondary | ICD-10-CM | POA: Diagnosis not present

## 2016-02-09 DIAGNOSIS — K219 Gastro-esophageal reflux disease without esophagitis: Secondary | ICD-10-CM | POA: Diagnosis not present

## 2016-02-09 DIAGNOSIS — E079 Disorder of thyroid, unspecified: Secondary | ICD-10-CM | POA: Diagnosis not present

## 2016-02-09 DIAGNOSIS — M8580 Other specified disorders of bone density and structure, unspecified site: Secondary | ICD-10-CM | POA: Diagnosis not present

## 2016-02-09 DIAGNOSIS — Z803 Family history of malignant neoplasm of breast: Secondary | ICD-10-CM | POA: Diagnosis not present

## 2016-02-09 DIAGNOSIS — Z87442 Personal history of urinary calculi: Secondary | ICD-10-CM | POA: Diagnosis not present

## 2016-02-09 DIAGNOSIS — Z800 Family history of malignant neoplasm of digestive organs: Secondary | ICD-10-CM | POA: Diagnosis not present

## 2016-02-09 DIAGNOSIS — E039 Hypothyroidism, unspecified: Secondary | ICD-10-CM | POA: Diagnosis not present

## 2016-02-09 DIAGNOSIS — E785 Hyperlipidemia, unspecified: Secondary | ICD-10-CM | POA: Diagnosis not present

## 2016-02-09 DIAGNOSIS — C50412 Malignant neoplasm of upper-outer quadrant of left female breast: Secondary | ICD-10-CM | POA: Diagnosis not present

## 2016-02-09 DIAGNOSIS — I10 Essential (primary) hypertension: Secondary | ICD-10-CM | POA: Diagnosis not present

## 2016-02-10 ENCOUNTER — Ambulatory Visit
Admission: RE | Admit: 2016-02-10 | Discharge: 2016-02-10 | Disposition: A | Discharge status: Home / Self Care | Payer: Medicare Other | Source: Ambulatory Visit | Attending: Radiation Oncology | Admitting: Radiation Oncology

## 2016-02-10 DIAGNOSIS — I10 Essential (primary) hypertension: Secondary | ICD-10-CM | POA: Diagnosis not present

## 2016-02-10 DIAGNOSIS — E039 Hypothyroidism, unspecified: Secondary | ICD-10-CM | POA: Diagnosis not present

## 2016-02-10 DIAGNOSIS — Z800 Family history of malignant neoplasm of digestive organs: Secondary | ICD-10-CM | POA: Diagnosis not present

## 2016-02-10 DIAGNOSIS — Z87442 Personal history of urinary calculi: Secondary | ICD-10-CM | POA: Diagnosis not present

## 2016-02-10 DIAGNOSIS — E785 Hyperlipidemia, unspecified: Secondary | ICD-10-CM | POA: Diagnosis not present

## 2016-02-10 DIAGNOSIS — E079 Disorder of thyroid, unspecified: Secondary | ICD-10-CM | POA: Diagnosis not present

## 2016-02-10 DIAGNOSIS — D649 Anemia, unspecified: Secondary | ICD-10-CM | POA: Diagnosis not present

## 2016-02-10 DIAGNOSIS — Z803 Family history of malignant neoplasm of breast: Secondary | ICD-10-CM | POA: Diagnosis not present

## 2016-02-10 DIAGNOSIS — K219 Gastro-esophageal reflux disease without esophagitis: Secondary | ICD-10-CM | POA: Diagnosis not present

## 2016-02-10 DIAGNOSIS — C50412 Malignant neoplasm of upper-outer quadrant of left female breast: Secondary | ICD-10-CM | POA: Diagnosis not present

## 2016-02-10 DIAGNOSIS — M8580 Other specified disorders of bone density and structure, unspecified site: Secondary | ICD-10-CM | POA: Diagnosis not present

## 2016-02-13 ENCOUNTER — Ambulatory Visit
Admission: RE | Admit: 2016-02-13 | Discharge: 2016-02-13 | Disposition: A | Discharge status: Home / Self Care | Payer: Medicare Other | Source: Ambulatory Visit | Attending: Radiation Oncology | Admitting: Radiation Oncology

## 2016-02-13 ENCOUNTER — Encounter: Payer: Self-pay | Admitting: Radiation Oncology

## 2016-02-13 VITALS — BP 123/54 | HR 77 | Temp 98.4°F | Ht 61.0 in | Wt 146.2 lb

## 2016-02-13 DIAGNOSIS — Z170 Estrogen receptor positive status [ER+]: Secondary | ICD-10-CM

## 2016-02-13 DIAGNOSIS — Z803 Family history of malignant neoplasm of breast: Secondary | ICD-10-CM | POA: Diagnosis not present

## 2016-02-13 DIAGNOSIS — E039 Hypothyroidism, unspecified: Secondary | ICD-10-CM | POA: Diagnosis not present

## 2016-02-13 DIAGNOSIS — Z87442 Personal history of urinary calculi: Secondary | ICD-10-CM | POA: Diagnosis not present

## 2016-02-13 DIAGNOSIS — K219 Gastro-esophageal reflux disease without esophagitis: Secondary | ICD-10-CM | POA: Diagnosis not present

## 2016-02-13 DIAGNOSIS — E785 Hyperlipidemia, unspecified: Secondary | ICD-10-CM | POA: Diagnosis not present

## 2016-02-13 DIAGNOSIS — I10 Essential (primary) hypertension: Secondary | ICD-10-CM | POA: Diagnosis not present

## 2016-02-13 DIAGNOSIS — E079 Disorder of thyroid, unspecified: Secondary | ICD-10-CM | POA: Diagnosis not present

## 2016-02-13 DIAGNOSIS — D649 Anemia, unspecified: Secondary | ICD-10-CM | POA: Diagnosis not present

## 2016-02-13 DIAGNOSIS — Z800 Family history of malignant neoplasm of digestive organs: Secondary | ICD-10-CM | POA: Diagnosis not present

## 2016-02-13 DIAGNOSIS — C50412 Malignant neoplasm of upper-outer quadrant of left female breast: Secondary | ICD-10-CM

## 2016-02-13 DIAGNOSIS — M8580 Other specified disorders of bone density and structure, unspecified site: Secondary | ICD-10-CM | POA: Diagnosis not present

## 2016-02-13 NOTE — Progress Notes (Signed)
   Weekly Management Note:  Outpatient    ICD-9-CM ICD-10-CM   1. Malignant neoplasm of upper-outer quadrant of left breast in female, estrogen receptor positive ([facility]) 174.4 C50.412    V86.0 Z17.0     Current Dose:  22 Gy  Projected Dose: 50 Gy   Narrative:  The patient presents for routine under treatment assessment.  CBCT/MVCT images/Port film x-rays were reviewed.  The chart was checked. Doing well.  Had P-A-C removed from right side  Physical Findings:  height is 5\' 1"  (1.549 m) and weight is 146 lb 3.2 oz (66.3 kg). Her temperature is 98.4 F (36.9 C). Her blood pressure is 123/54 (abnormal) and her pulse is 77. Her oxygen saturation is 98%.   Wt Readings from Last 3 Encounters:  02/13/16 146 lb 3.2 oz (66.3 kg)  01/30/16 145 lb 6.4 oz (66 kg)  01/18/16 143 lb (64.9 kg)   NAD, + erythema over left breast   Impression:  The patient is tolerating radiotherapy.  Plan:  Continue radiotherapy as planned. Patient instructed to apply Radiplex to intact skin in treatment fields.     ________________________________   Eppie Gibson, M.D.

## 2016-02-13 NOTE — Progress Notes (Signed)
[name] is here for her 11th fraction of radiation to her Left Breast. She denies pain or fatigue. She does report itching to her arms and legs. Her Left Breast is slightly red. She is using the Radiaplex cream twice daily as directed.  BP (!) 123/54   Pulse 77   Temp 98.4 F (36.9 C)   Ht 5\' 1"  (1.549 m)   Wt 146 lb 3.2 oz (66.3 kg)   SpO2 98% Comment: room air  BMI 27.62 kg/m    Wt Readings from Last 3 Encounters:  02/13/16 146 lb 3.2 oz (66.3 kg)  01/30/16 145 lb 6.4 oz (66 kg)  01/18/16 143 lb (64.9 kg)

## 2016-02-14 ENCOUNTER — Ambulatory Visit
Admission: RE | Admit: 2016-02-14 | Discharge: 2016-02-14 | Disposition: A | Discharge status: Home / Self Care | Payer: Medicare Other | Source: Ambulatory Visit | Attending: Radiation Oncology | Admitting: Radiation Oncology

## 2016-02-14 DIAGNOSIS — M8580 Other specified disorders of bone density and structure, unspecified site: Secondary | ICD-10-CM | POA: Diagnosis not present

## 2016-02-14 DIAGNOSIS — K219 Gastro-esophageal reflux disease without esophagitis: Secondary | ICD-10-CM | POA: Diagnosis not present

## 2016-02-14 DIAGNOSIS — D649 Anemia, unspecified: Secondary | ICD-10-CM | POA: Diagnosis not present

## 2016-02-14 DIAGNOSIS — I10 Essential (primary) hypertension: Secondary | ICD-10-CM | POA: Diagnosis not present

## 2016-02-14 DIAGNOSIS — Z803 Family history of malignant neoplasm of breast: Secondary | ICD-10-CM | POA: Diagnosis not present

## 2016-02-14 DIAGNOSIS — Z800 Family history of malignant neoplasm of digestive organs: Secondary | ICD-10-CM | POA: Diagnosis not present

## 2016-02-14 DIAGNOSIS — E785 Hyperlipidemia, unspecified: Secondary | ICD-10-CM | POA: Diagnosis not present

## 2016-02-14 DIAGNOSIS — E039 Hypothyroidism, unspecified: Secondary | ICD-10-CM | POA: Diagnosis not present

## 2016-02-14 DIAGNOSIS — Z87442 Personal history of urinary calculi: Secondary | ICD-10-CM | POA: Diagnosis not present

## 2016-02-14 DIAGNOSIS — E079 Disorder of thyroid, unspecified: Secondary | ICD-10-CM | POA: Diagnosis not present

## 2016-02-14 DIAGNOSIS — C50412 Malignant neoplasm of upper-outer quadrant of left female breast: Secondary | ICD-10-CM | POA: Diagnosis not present

## 2016-02-15 ENCOUNTER — Ambulatory Visit
Admission: RE | Admit: 2016-02-15 | Discharge: 2016-02-15 | Disposition: A | Discharge status: Home / Self Care | Payer: Medicare Other | Source: Ambulatory Visit | Attending: Radiation Oncology | Admitting: Radiation Oncology

## 2016-02-15 DIAGNOSIS — Z803 Family history of malignant neoplasm of breast: Secondary | ICD-10-CM | POA: Diagnosis not present

## 2016-02-15 DIAGNOSIS — E079 Disorder of thyroid, unspecified: Secondary | ICD-10-CM | POA: Diagnosis not present

## 2016-02-15 DIAGNOSIS — E039 Hypothyroidism, unspecified: Secondary | ICD-10-CM | POA: Diagnosis not present

## 2016-02-15 DIAGNOSIS — Z87442 Personal history of urinary calculi: Secondary | ICD-10-CM | POA: Diagnosis not present

## 2016-02-15 DIAGNOSIS — C50412 Malignant neoplasm of upper-outer quadrant of left female breast: Secondary | ICD-10-CM | POA: Diagnosis not present

## 2016-02-15 DIAGNOSIS — Z800 Family history of malignant neoplasm of digestive organs: Secondary | ICD-10-CM | POA: Diagnosis not present

## 2016-02-15 DIAGNOSIS — M8580 Other specified disorders of bone density and structure, unspecified site: Secondary | ICD-10-CM | POA: Diagnosis not present

## 2016-02-15 DIAGNOSIS — E785 Hyperlipidemia, unspecified: Secondary | ICD-10-CM | POA: Diagnosis not present

## 2016-02-15 DIAGNOSIS — D649 Anemia, unspecified: Secondary | ICD-10-CM | POA: Diagnosis not present

## 2016-02-15 DIAGNOSIS — K219 Gastro-esophageal reflux disease without esophagitis: Secondary | ICD-10-CM | POA: Diagnosis not present

## 2016-02-15 DIAGNOSIS — I10 Essential (primary) hypertension: Secondary | ICD-10-CM | POA: Diagnosis not present

## 2016-02-16 ENCOUNTER — Ambulatory Visit
Admission: RE | Admit: 2016-02-16 | Discharge: 2016-02-16 | Disposition: A | Discharge status: Home / Self Care | Payer: Medicare Other | Source: Ambulatory Visit | Attending: Radiation Oncology | Admitting: Radiation Oncology

## 2016-02-16 DIAGNOSIS — E039 Hypothyroidism, unspecified: Secondary | ICD-10-CM | POA: Diagnosis not present

## 2016-02-16 DIAGNOSIS — K219 Gastro-esophageal reflux disease without esophagitis: Secondary | ICD-10-CM | POA: Diagnosis not present

## 2016-02-16 DIAGNOSIS — E785 Hyperlipidemia, unspecified: Secondary | ICD-10-CM | POA: Diagnosis not present

## 2016-02-16 DIAGNOSIS — M8580 Other specified disorders of bone density and structure, unspecified site: Secondary | ICD-10-CM | POA: Diagnosis not present

## 2016-02-16 DIAGNOSIS — Z87442 Personal history of urinary calculi: Secondary | ICD-10-CM | POA: Diagnosis not present

## 2016-02-16 DIAGNOSIS — I10 Essential (primary) hypertension: Secondary | ICD-10-CM | POA: Diagnosis not present

## 2016-02-16 DIAGNOSIS — Z800 Family history of malignant neoplasm of digestive organs: Secondary | ICD-10-CM | POA: Diagnosis not present

## 2016-02-16 DIAGNOSIS — D649 Anemia, unspecified: Secondary | ICD-10-CM | POA: Diagnosis not present

## 2016-02-16 DIAGNOSIS — E079 Disorder of thyroid, unspecified: Secondary | ICD-10-CM | POA: Diagnosis not present

## 2016-02-16 DIAGNOSIS — C50412 Malignant neoplasm of upper-outer quadrant of left female breast: Secondary | ICD-10-CM | POA: Diagnosis not present

## 2016-02-16 DIAGNOSIS — Z803 Family history of malignant neoplasm of breast: Secondary | ICD-10-CM | POA: Diagnosis not present

## 2016-02-17 ENCOUNTER — Ambulatory Visit
Admission: RE | Admit: 2016-02-17 | Discharge: 2016-02-17 | Disposition: A | Discharge status: Home / Self Care | Payer: Medicare Other | Source: Ambulatory Visit | Attending: Radiation Oncology | Admitting: Radiation Oncology

## 2016-02-17 DIAGNOSIS — E785 Hyperlipidemia, unspecified: Secondary | ICD-10-CM | POA: Diagnosis not present

## 2016-02-17 DIAGNOSIS — K219 Gastro-esophageal reflux disease without esophagitis: Secondary | ICD-10-CM | POA: Diagnosis not present

## 2016-02-17 DIAGNOSIS — Z803 Family history of malignant neoplasm of breast: Secondary | ICD-10-CM | POA: Diagnosis not present

## 2016-02-17 DIAGNOSIS — M8580 Other specified disorders of bone density and structure, unspecified site: Secondary | ICD-10-CM | POA: Diagnosis not present

## 2016-02-17 DIAGNOSIS — E079 Disorder of thyroid, unspecified: Secondary | ICD-10-CM | POA: Diagnosis not present

## 2016-02-17 DIAGNOSIS — Z800 Family history of malignant neoplasm of digestive organs: Secondary | ICD-10-CM | POA: Diagnosis not present

## 2016-02-17 DIAGNOSIS — C50412 Malignant neoplasm of upper-outer quadrant of left female breast: Secondary | ICD-10-CM | POA: Diagnosis not present

## 2016-02-17 DIAGNOSIS — E039 Hypothyroidism, unspecified: Secondary | ICD-10-CM | POA: Diagnosis not present

## 2016-02-17 DIAGNOSIS — I10 Essential (primary) hypertension: Secondary | ICD-10-CM | POA: Diagnosis not present

## 2016-02-17 DIAGNOSIS — Z87442 Personal history of urinary calculi: Secondary | ICD-10-CM | POA: Diagnosis not present

## 2016-02-17 DIAGNOSIS — D649 Anemia, unspecified: Secondary | ICD-10-CM | POA: Diagnosis not present

## 2016-02-20 ENCOUNTER — Encounter: Payer: Self-pay | Admitting: Radiation Oncology

## 2016-02-20 ENCOUNTER — Ambulatory Visit
Admission: RE | Admit: 2016-02-20 | Discharge: 2016-02-20 | Disposition: A | Discharge status: Home / Self Care | Payer: Medicare Other | Source: Ambulatory Visit | Attending: Radiation Oncology | Admitting: Radiation Oncology

## 2016-02-20 ENCOUNTER — Other Ambulatory Visit: Payer: Self-pay

## 2016-02-20 ENCOUNTER — Ambulatory Visit: Payer: Medicare Other | Admitting: Oncology

## 2016-02-20 ENCOUNTER — Other Ambulatory Visit: Payer: Medicare Other

## 2016-02-20 VITALS — BP 130/64 | HR 68 | Temp 98.2°F | Ht 61.0 in | Wt 145.4 lb

## 2016-02-20 DIAGNOSIS — I10 Essential (primary) hypertension: Secondary | ICD-10-CM | POA: Diagnosis not present

## 2016-02-20 DIAGNOSIS — M8580 Other specified disorders of bone density and structure, unspecified site: Secondary | ICD-10-CM | POA: Diagnosis not present

## 2016-02-20 DIAGNOSIS — K219 Gastro-esophageal reflux disease without esophagitis: Secondary | ICD-10-CM | POA: Diagnosis not present

## 2016-02-20 DIAGNOSIS — Z800 Family history of malignant neoplasm of digestive organs: Secondary | ICD-10-CM | POA: Diagnosis not present

## 2016-02-20 DIAGNOSIS — D649 Anemia, unspecified: Secondary | ICD-10-CM | POA: Diagnosis not present

## 2016-02-20 DIAGNOSIS — Z87442 Personal history of urinary calculi: Secondary | ICD-10-CM | POA: Diagnosis not present

## 2016-02-20 DIAGNOSIS — Z803 Family history of malignant neoplasm of breast: Secondary | ICD-10-CM | POA: Diagnosis not present

## 2016-02-20 DIAGNOSIS — E785 Hyperlipidemia, unspecified: Secondary | ICD-10-CM | POA: Diagnosis not present

## 2016-02-20 DIAGNOSIS — E039 Hypothyroidism, unspecified: Secondary | ICD-10-CM | POA: Diagnosis not present

## 2016-02-20 DIAGNOSIS — C50412 Malignant neoplasm of upper-outer quadrant of left female breast: Secondary | ICD-10-CM | POA: Diagnosis not present

## 2016-02-20 DIAGNOSIS — E079 Disorder of thyroid, unspecified: Secondary | ICD-10-CM | POA: Diagnosis not present

## 2016-02-20 DIAGNOSIS — Z170 Estrogen receptor positive status [ER+]: Secondary | ICD-10-CM

## 2016-02-20 MED ORDER — TRAZODONE HCL 50 MG PO TABS: 50.0000 mg | ORAL_TABLET | Freq: Every day | ORAL | 4 refills | Status: DC

## 2016-02-20 NOTE — Progress Notes (Signed)
[name] has completed 16 fractions to her left breast.  She reports having discomfort in her left breast that started Friday and last until Sunday.  She denies having fatigue.  She is using radiaplex.  The skin on her left breast is pink.  BP 130/64 (BP Location: Right Arm, Patient Position: Sitting)   Pulse 68   Temp 98.2 F (36.8 C) (Oral)   Ht 5\' 1"  (1.549 m)   Wt 145 lb 6.4 oz (66 kg)   SpO2 98%   BMI 27.47 kg/m    Wt Readings from Last 3 Encounters:  02/20/16 145 lb 6.4 oz (66 kg)  02/13/16 146 lb 3.2 oz (66.3 kg)  01/30/16 145 lb 6.4 oz (66 kg)

## 2016-02-20 NOTE — Progress Notes (Signed)
   Weekly Management Note:  Outpatient    ICD-9-CM ICD-10-CM   1. Malignant neoplasm of upper-outer quadrant of left breast in female, estrogen receptor positive ([facility]) 174.4 C50.412    V86.0 Z17.0     Current Dose:  32 Gy  Projected Dose: 50 Gy   Narrative:  The patient presents for routine under treatment assessment.  CBCT/MVCT images/Port film x-rays were reviewed.  The chart was checked. Doing well.   Some soreness of breast, left  Physical Findings:  height is 5\' 1"  (1.549 m) and weight is 145 lb 6.4 oz (66 kg). Her oral temperature is 98.2 F (36.8 C). Her blood pressure is 130/64 and her pulse is 68. Her oxygen saturation is 98%.   Wt Readings from Last 3 Encounters:  02/20/16 145 lb 6.4 oz (66 kg)  02/13/16 146 lb 3.2 oz (66.3 kg)  01/30/16 145 lb 6.4 oz (66 kg)   NAD, mild erythema over left breast   Impression:  The patient is tolerating radiotherapy.  Plan:  Continue radiotherapy as planned. Patient instructed to apply Radiplex to intact skin in treatment fields.   May take OTC pain meds if needed for breast ________________________________   Eppie Gibson, M.D.

## 2016-02-21 ENCOUNTER — Ambulatory Visit
Admission: RE | Admit: 2016-02-21 | Discharge: 2016-02-21 | Disposition: A | Discharge status: Home / Self Care | Payer: Medicare Other | Source: Ambulatory Visit | Attending: Radiation Oncology | Admitting: Radiation Oncology

## 2016-02-21 DIAGNOSIS — Z803 Family history of malignant neoplasm of breast: Secondary | ICD-10-CM | POA: Diagnosis not present

## 2016-02-21 DIAGNOSIS — M8580 Other specified disorders of bone density and structure, unspecified site: Secondary | ICD-10-CM | POA: Diagnosis not present

## 2016-02-21 DIAGNOSIS — K219 Gastro-esophageal reflux disease without esophagitis: Secondary | ICD-10-CM | POA: Diagnosis not present

## 2016-02-21 DIAGNOSIS — E785 Hyperlipidemia, unspecified: Secondary | ICD-10-CM | POA: Diagnosis not present

## 2016-02-21 DIAGNOSIS — C50412 Malignant neoplasm of upper-outer quadrant of left female breast: Secondary | ICD-10-CM | POA: Diagnosis not present

## 2016-02-21 DIAGNOSIS — E039 Hypothyroidism, unspecified: Secondary | ICD-10-CM | POA: Diagnosis not present

## 2016-02-21 DIAGNOSIS — D649 Anemia, unspecified: Secondary | ICD-10-CM | POA: Diagnosis not present

## 2016-02-21 DIAGNOSIS — I10 Essential (primary) hypertension: Secondary | ICD-10-CM | POA: Diagnosis not present

## 2016-02-21 DIAGNOSIS — Z87442 Personal history of urinary calculi: Secondary | ICD-10-CM | POA: Diagnosis not present

## 2016-02-21 DIAGNOSIS — E079 Disorder of thyroid, unspecified: Secondary | ICD-10-CM | POA: Diagnosis not present

## 2016-02-21 DIAGNOSIS — Z800 Family history of malignant neoplasm of digestive organs: Secondary | ICD-10-CM | POA: Diagnosis not present

## 2016-02-22 ENCOUNTER — Telehealth: Payer: Self-pay | Admitting: Family Medicine

## 2016-02-22 ENCOUNTER — Other Ambulatory Visit: Payer: Self-pay

## 2016-02-22 ENCOUNTER — Ambulatory Visit
Admission: RE | Admit: 2016-02-22 | Discharge: 2016-02-22 | Disposition: A | Discharge status: Home / Self Care | Payer: Medicare Other | Source: Ambulatory Visit | Attending: Radiation Oncology | Admitting: Radiation Oncology

## 2016-02-22 DIAGNOSIS — E785 Hyperlipidemia, unspecified: Secondary | ICD-10-CM | POA: Diagnosis not present

## 2016-02-22 DIAGNOSIS — E039 Hypothyroidism, unspecified: Secondary | ICD-10-CM | POA: Diagnosis not present

## 2016-02-22 DIAGNOSIS — Z803 Family history of malignant neoplasm of breast: Secondary | ICD-10-CM | POA: Diagnosis not present

## 2016-02-22 DIAGNOSIS — I10 Essential (primary) hypertension: Secondary | ICD-10-CM | POA: Diagnosis not present

## 2016-02-22 DIAGNOSIS — Z87442 Personal history of urinary calculi: Secondary | ICD-10-CM | POA: Diagnosis not present

## 2016-02-22 DIAGNOSIS — E079 Disorder of thyroid, unspecified: Secondary | ICD-10-CM | POA: Diagnosis not present

## 2016-02-22 DIAGNOSIS — Z800 Family history of malignant neoplasm of digestive organs: Secondary | ICD-10-CM | POA: Diagnosis not present

## 2016-02-22 DIAGNOSIS — C50412 Malignant neoplasm of upper-outer quadrant of left female breast: Secondary | ICD-10-CM | POA: Diagnosis not present

## 2016-02-22 DIAGNOSIS — M8580 Other specified disorders of bone density and structure, unspecified site: Secondary | ICD-10-CM | POA: Diagnosis not present

## 2016-02-22 DIAGNOSIS — D649 Anemia, unspecified: Secondary | ICD-10-CM | POA: Diagnosis not present

## 2016-02-22 DIAGNOSIS — K219 Gastro-esophageal reflux disease without esophagitis: Secondary | ICD-10-CM | POA: Diagnosis not present

## 2016-02-22 MED ORDER — SIMVASTATIN 40 MG PO TABS: ORAL_TABLET | ORAL | 5 refills | Status: DC

## 2016-02-22 MED ORDER — LEVOTHYROXINE SODIUM 50 MCG PO TABS: ORAL_TABLET | ORAL | 5 refills | Status: DC

## 2016-02-22 MED ORDER — ATENOLOL 50 MG PO TABS: ORAL_TABLET | ORAL | 2 refills | Status: DC

## 2016-02-22 NOTE — Telephone Encounter (Signed)
Medication sent in for patient. 

## 2016-02-22 NOTE — Telephone Encounter (Signed)
Pharmacy called for pt to request a refill of  atenolol (TENORMIN) 50 MG tablet  90 day supply  [facility]/ [address]

## 2016-02-23 ENCOUNTER — Ambulatory Visit
Admission: RE | Admit: 2016-02-23 | Discharge: 2016-02-23 | Disposition: A | Discharge status: Home / Self Care | Payer: Medicare Other | Source: Ambulatory Visit | Attending: Radiation Oncology | Admitting: Radiation Oncology

## 2016-02-23 DIAGNOSIS — E039 Hypothyroidism, unspecified: Secondary | ICD-10-CM | POA: Diagnosis not present

## 2016-02-23 DIAGNOSIS — I10 Essential (primary) hypertension: Secondary | ICD-10-CM | POA: Diagnosis not present

## 2016-02-23 DIAGNOSIS — C50412 Malignant neoplasm of upper-outer quadrant of left female breast: Secondary | ICD-10-CM | POA: Diagnosis not present

## 2016-02-23 DIAGNOSIS — D649 Anemia, unspecified: Secondary | ICD-10-CM | POA: Diagnosis not present

## 2016-02-23 DIAGNOSIS — M8580 Other specified disorders of bone density and structure, unspecified site: Secondary | ICD-10-CM | POA: Diagnosis not present

## 2016-02-23 DIAGNOSIS — Z87442 Personal history of urinary calculi: Secondary | ICD-10-CM | POA: Diagnosis not present

## 2016-02-23 DIAGNOSIS — E079 Disorder of thyroid, unspecified: Secondary | ICD-10-CM | POA: Diagnosis not present

## 2016-02-23 DIAGNOSIS — K219 Gastro-esophageal reflux disease without esophagitis: Secondary | ICD-10-CM | POA: Diagnosis not present

## 2016-02-23 DIAGNOSIS — E785 Hyperlipidemia, unspecified: Secondary | ICD-10-CM | POA: Diagnosis not present

## 2016-02-23 DIAGNOSIS — Z803 Family history of malignant neoplasm of breast: Secondary | ICD-10-CM | POA: Diagnosis not present

## 2016-02-23 DIAGNOSIS — Z800 Family history of malignant neoplasm of digestive organs: Secondary | ICD-10-CM | POA: Diagnosis not present

## 2016-02-24 ENCOUNTER — Ambulatory Visit
Admission: RE | Admit: 2016-02-24 | Discharge: 2016-02-24 | Disposition: A | Discharge status: Home / Self Care | Payer: Medicare Other | Source: Ambulatory Visit | Attending: Radiation Oncology | Admitting: Radiation Oncology

## 2016-02-24 DIAGNOSIS — K219 Gastro-esophageal reflux disease without esophagitis: Secondary | ICD-10-CM | POA: Diagnosis not present

## 2016-02-24 DIAGNOSIS — C50412 Malignant neoplasm of upper-outer quadrant of left female breast: Secondary | ICD-10-CM | POA: Diagnosis not present

## 2016-02-24 DIAGNOSIS — E785 Hyperlipidemia, unspecified: Secondary | ICD-10-CM | POA: Diagnosis not present

## 2016-02-24 DIAGNOSIS — E079 Disorder of thyroid, unspecified: Secondary | ICD-10-CM | POA: Diagnosis not present

## 2016-02-24 DIAGNOSIS — Z803 Family history of malignant neoplasm of breast: Secondary | ICD-10-CM | POA: Diagnosis not present

## 2016-02-24 DIAGNOSIS — E039 Hypothyroidism, unspecified: Secondary | ICD-10-CM | POA: Diagnosis not present

## 2016-02-24 DIAGNOSIS — Z87442 Personal history of urinary calculi: Secondary | ICD-10-CM | POA: Diagnosis not present

## 2016-02-24 DIAGNOSIS — D649 Anemia, unspecified: Secondary | ICD-10-CM | POA: Diagnosis not present

## 2016-02-24 DIAGNOSIS — Z800 Family history of malignant neoplasm of digestive organs: Secondary | ICD-10-CM | POA: Diagnosis not present

## 2016-02-24 DIAGNOSIS — I10 Essential (primary) hypertension: Secondary | ICD-10-CM | POA: Diagnosis not present

## 2016-02-24 DIAGNOSIS — M8580 Other specified disorders of bone density and structure, unspecified site: Secondary | ICD-10-CM | POA: Diagnosis not present

## 2016-02-27 ENCOUNTER — Encounter: Payer: Self-pay | Admitting: Radiation Oncology

## 2016-02-27 ENCOUNTER — Ambulatory Visit
Admission: RE | Admit: 2016-02-27 | Discharge: 2016-02-27 | Disposition: A | Discharge status: Home / Self Care | Payer: Medicare Other | Source: Ambulatory Visit | Attending: Radiation Oncology | Admitting: Radiation Oncology

## 2016-02-27 ENCOUNTER — Other Ambulatory Visit: Payer: Self-pay | Admitting: Family Medicine

## 2016-02-27 VITALS — BP 150/69 | HR 70 | Temp 99.1°F | Ht 61.0 in | Wt 147.8 lb

## 2016-02-27 DIAGNOSIS — M8580 Other specified disorders of bone density and structure, unspecified site: Secondary | ICD-10-CM | POA: Diagnosis not present

## 2016-02-27 DIAGNOSIS — D649 Anemia, unspecified: Secondary | ICD-10-CM | POA: Diagnosis not present

## 2016-02-27 DIAGNOSIS — Z170 Estrogen receptor positive status [ER+]: Secondary | ICD-10-CM | POA: Insufficient documentation

## 2016-02-27 DIAGNOSIS — E039 Hypothyroidism, unspecified: Secondary | ICD-10-CM | POA: Diagnosis not present

## 2016-02-27 DIAGNOSIS — E079 Disorder of thyroid, unspecified: Secondary | ICD-10-CM | POA: Diagnosis not present

## 2016-02-27 DIAGNOSIS — Z5189 Encounter for other specified aftercare: Secondary | ICD-10-CM | POA: Insufficient documentation

## 2016-02-27 DIAGNOSIS — Z87442 Personal history of urinary calculi: Secondary | ICD-10-CM | POA: Diagnosis not present

## 2016-02-27 DIAGNOSIS — C50412 Malignant neoplasm of upper-outer quadrant of left female breast: Secondary | ICD-10-CM

## 2016-02-27 DIAGNOSIS — Z923 Personal history of irradiation: Secondary | ICD-10-CM

## 2016-02-27 DIAGNOSIS — E785 Hyperlipidemia, unspecified: Secondary | ICD-10-CM | POA: Diagnosis not present

## 2016-02-27 DIAGNOSIS — Z803 Family history of malignant neoplasm of breast: Secondary | ICD-10-CM | POA: Diagnosis not present

## 2016-02-27 DIAGNOSIS — K219 Gastro-esophageal reflux disease without esophagitis: Secondary | ICD-10-CM | POA: Diagnosis not present

## 2016-02-27 DIAGNOSIS — I10 Essential (primary) hypertension: Secondary | ICD-10-CM | POA: Diagnosis not present

## 2016-02-27 DIAGNOSIS — Z800 Family history of malignant neoplasm of digestive organs: Secondary | ICD-10-CM | POA: Diagnosis not present

## 2016-02-27 MED ORDER — SONAFINE EX EMUL
1.0000 "application " | Freq: Once | CUTANEOUS | Status: AC
  Administered 2016-02-27: 1 via TOPICAL

## 2016-02-27 NOTE — Progress Notes (Signed)
[name] has completed 21 fractions to her left breast.  She denies having pain and fatigue.  She is using radiaplex bid.  She reports having some burning of the skin on her left breast last night that was relieved with the radiaplex.  The skin on her left breast is pink with an area of dermatitis on the upper potion of her breast.  She will be given sonafine to try.  BP (!) 150/69 (BP Location: Right Arm, Patient Position: Sitting)   Pulse 70   Temp 99.1 F (37.3 C) (Oral)   Ht 5\' 1"  (1.549 m)   Wt 147 lb 12.8 oz (67 kg)   SpO2 98%   BMI 27.93 kg/m    Wt Readings from Last 3 Encounters:  02/27/16 147 lb 12.8 oz (67 kg)  02/20/16 145 lb 6.4 oz (66 kg)  02/13/16 146 lb 3.2 oz (66.3 kg)

## 2016-02-27 NOTE — Telephone Encounter (Signed)
This medication was filled on 02/22/16. Spoke to pharmacy and verified the refills.

## 2016-02-27 NOTE — Progress Notes (Signed)
   Weekly Management Note:  Outpatient    ICD-9-CM ICD-10-CM   1. Malignant neoplasm of upper-outer quadrant of left breast in female, estrogen receptor positive ([facility]) 174.4 C50.412 SONAFINE emulsion 1 application   [id number] [id number]     Current Dose:  42 Gy  Projected Dose: 50 Gy   Narrative:  The patient presents for routine under treatment assessment.  CBCT/MVCT images/Port film x-rays were reviewed.  The chart was checked. Doing well. Left breast is a little pink  Physical Findings:  height is 5\' 1"  (1.549 m) and weight is 147 lb 12.8 oz (67 kg). Her oral temperature is 99.1 F (37.3 C). Her blood pressure is 150/69 (abnormal) and her pulse is 70. Her oxygen saturation is 98%.   Wt Readings from Last 3 Encounters:  02/27/16 147 lb 12.8 oz (67 kg)  02/20/16 145 lb 6.4 oz (66 kg)  02/13/16 146 lb 3.2 oz (66.3 kg)   NAD, mild erythema over left breast/ skin intact   Impression:  The patient is tolerating radiotherapy.  Plan:  Continue radiotherapy as planned. Patient instructed to apply Radiplex to intact skin in treatment fields.  Or, try sonafine.  42mo f/u ordered ________________________________   Eppie Gibson, M.D.

## 2016-02-28 ENCOUNTER — Ambulatory Visit
Admission: RE | Admit: 2016-02-28 | Discharge: 2016-02-28 | Disposition: A | Discharge status: Home / Self Care | Payer: Medicare Other | Source: Ambulatory Visit | Attending: Radiation Oncology | Admitting: Radiation Oncology

## 2016-02-28 DIAGNOSIS — I10 Essential (primary) hypertension: Secondary | ICD-10-CM | POA: Diagnosis not present

## 2016-02-28 DIAGNOSIS — Z803 Family history of malignant neoplasm of breast: Secondary | ICD-10-CM | POA: Diagnosis not present

## 2016-02-28 DIAGNOSIS — C50412 Malignant neoplasm of upper-outer quadrant of left female breast: Secondary | ICD-10-CM | POA: Diagnosis not present

## 2016-02-28 DIAGNOSIS — M8580 Other specified disorders of bone density and structure, unspecified site: Secondary | ICD-10-CM | POA: Diagnosis not present

## 2016-02-28 DIAGNOSIS — E785 Hyperlipidemia, unspecified: Secondary | ICD-10-CM | POA: Diagnosis not present

## 2016-02-28 DIAGNOSIS — E039 Hypothyroidism, unspecified: Secondary | ICD-10-CM | POA: Diagnosis not present

## 2016-02-28 DIAGNOSIS — Z87442 Personal history of urinary calculi: Secondary | ICD-10-CM | POA: Diagnosis not present

## 2016-02-28 DIAGNOSIS — D649 Anemia, unspecified: Secondary | ICD-10-CM | POA: Diagnosis not present

## 2016-02-28 DIAGNOSIS — Z800 Family history of malignant neoplasm of digestive organs: Secondary | ICD-10-CM | POA: Diagnosis not present

## 2016-02-28 DIAGNOSIS — K219 Gastro-esophageal reflux disease without esophagitis: Secondary | ICD-10-CM | POA: Diagnosis not present

## 2016-02-28 DIAGNOSIS — E079 Disorder of thyroid, unspecified: Secondary | ICD-10-CM | POA: Diagnosis not present

## 2016-02-29 ENCOUNTER — Ambulatory Visit
Admission: RE | Admit: 2016-02-29 | Discharge: 2016-02-29 | Disposition: A | Discharge status: Home / Self Care | Payer: Medicare Other | Source: Ambulatory Visit | Attending: Radiation Oncology | Admitting: Radiation Oncology

## 2016-02-29 DIAGNOSIS — Z800 Family history of malignant neoplasm of digestive organs: Secondary | ICD-10-CM | POA: Diagnosis not present

## 2016-02-29 DIAGNOSIS — Z803 Family history of malignant neoplasm of breast: Secondary | ICD-10-CM | POA: Diagnosis not present

## 2016-02-29 DIAGNOSIS — M8580 Other specified disorders of bone density and structure, unspecified site: Secondary | ICD-10-CM | POA: Diagnosis not present

## 2016-02-29 DIAGNOSIS — D649 Anemia, unspecified: Secondary | ICD-10-CM | POA: Diagnosis not present

## 2016-02-29 DIAGNOSIS — E079 Disorder of thyroid, unspecified: Secondary | ICD-10-CM | POA: Diagnosis not present

## 2016-02-29 DIAGNOSIS — E039 Hypothyroidism, unspecified: Secondary | ICD-10-CM | POA: Diagnosis not present

## 2016-02-29 DIAGNOSIS — C50412 Malignant neoplasm of upper-outer quadrant of left female breast: Secondary | ICD-10-CM | POA: Diagnosis not present

## 2016-02-29 DIAGNOSIS — E785 Hyperlipidemia, unspecified: Secondary | ICD-10-CM | POA: Diagnosis not present

## 2016-02-29 DIAGNOSIS — K219 Gastro-esophageal reflux disease without esophagitis: Secondary | ICD-10-CM | POA: Diagnosis not present

## 2016-02-29 DIAGNOSIS — Z87442 Personal history of urinary calculi: Secondary | ICD-10-CM | POA: Diagnosis not present

## 2016-02-29 DIAGNOSIS — I10 Essential (primary) hypertension: Secondary | ICD-10-CM | POA: Diagnosis not present

## 2016-03-01 ENCOUNTER — Ambulatory Visit
Admission: RE | Admit: 2016-03-01 | Discharge: 2016-03-01 | Disposition: A | Discharge status: Home / Self Care | Payer: Medicare Other | Source: Ambulatory Visit | Attending: Radiation Oncology | Admitting: Radiation Oncology

## 2016-03-01 DIAGNOSIS — K219 Gastro-esophageal reflux disease without esophagitis: Secondary | ICD-10-CM | POA: Diagnosis not present

## 2016-03-01 DIAGNOSIS — E785 Hyperlipidemia, unspecified: Secondary | ICD-10-CM | POA: Diagnosis not present

## 2016-03-01 DIAGNOSIS — E039 Hypothyroidism, unspecified: Secondary | ICD-10-CM | POA: Diagnosis not present

## 2016-03-01 DIAGNOSIS — E079 Disorder of thyroid, unspecified: Secondary | ICD-10-CM | POA: Diagnosis not present

## 2016-03-01 DIAGNOSIS — I10 Essential (primary) hypertension: Secondary | ICD-10-CM | POA: Diagnosis not present

## 2016-03-01 DIAGNOSIS — Z800 Family history of malignant neoplasm of digestive organs: Secondary | ICD-10-CM | POA: Diagnosis not present

## 2016-03-01 DIAGNOSIS — Z803 Family history of malignant neoplasm of breast: Secondary | ICD-10-CM | POA: Diagnosis not present

## 2016-03-01 DIAGNOSIS — C50412 Malignant neoplasm of upper-outer quadrant of left female breast: Secondary | ICD-10-CM | POA: Diagnosis not present

## 2016-03-01 DIAGNOSIS — Z87442 Personal history of urinary calculi: Secondary | ICD-10-CM | POA: Diagnosis not present

## 2016-03-01 DIAGNOSIS — M8580 Other specified disorders of bone density and structure, unspecified site: Secondary | ICD-10-CM | POA: Diagnosis not present

## 2016-03-01 DIAGNOSIS — D649 Anemia, unspecified: Secondary | ICD-10-CM | POA: Diagnosis not present

## 2016-03-02 ENCOUNTER — Other Ambulatory Visit: Payer: Self-pay

## 2016-03-02 ENCOUNTER — Telehealth: Payer: Self-pay | Admitting: *Deleted

## 2016-03-02 ENCOUNTER — Ambulatory Visit
Admission: RE | Admit: 2016-03-02 | Discharge: 2016-03-02 | Disposition: A | Discharge status: Home / Self Care | Payer: Medicare Other | Source: Ambulatory Visit | Attending: Radiation Oncology | Admitting: Radiation Oncology

## 2016-03-02 ENCOUNTER — Encounter: Payer: Self-pay | Admitting: Radiation Oncology

## 2016-03-02 DIAGNOSIS — M8580 Other specified disorders of bone density and structure, unspecified site: Secondary | ICD-10-CM | POA: Diagnosis not present

## 2016-03-02 DIAGNOSIS — C50412 Malignant neoplasm of upper-outer quadrant of left female breast: Secondary | ICD-10-CM | POA: Diagnosis not present

## 2016-03-02 DIAGNOSIS — E785 Hyperlipidemia, unspecified: Secondary | ICD-10-CM | POA: Diagnosis not present

## 2016-03-02 DIAGNOSIS — Z800 Family history of malignant neoplasm of digestive organs: Secondary | ICD-10-CM | POA: Diagnosis not present

## 2016-03-02 DIAGNOSIS — E079 Disorder of thyroid, unspecified: Secondary | ICD-10-CM | POA: Diagnosis not present

## 2016-03-02 DIAGNOSIS — Z803 Family history of malignant neoplasm of breast: Secondary | ICD-10-CM | POA: Diagnosis not present

## 2016-03-02 DIAGNOSIS — I10 Essential (primary) hypertension: Secondary | ICD-10-CM | POA: Diagnosis not present

## 2016-03-02 DIAGNOSIS — D649 Anemia, unspecified: Secondary | ICD-10-CM | POA: Diagnosis not present

## 2016-03-02 DIAGNOSIS — E039 Hypothyroidism, unspecified: Secondary | ICD-10-CM | POA: Diagnosis not present

## 2016-03-02 DIAGNOSIS — K219 Gastro-esophageal reflux disease without esophagitis: Secondary | ICD-10-CM | POA: Diagnosis not present

## 2016-03-02 DIAGNOSIS — Z87442 Personal history of urinary calculi: Secondary | ICD-10-CM | POA: Diagnosis not present

## 2016-03-02 MED ORDER — LEVOTHYROXINE SODIUM 50 MCG PO TABS: ORAL_TABLET | ORAL | 2 refills | Status: DC

## 2016-03-02 MED ORDER — SIMVASTATIN 40 MG PO TABS: ORAL_TABLET | ORAL | 2 refills | Status: DC

## 2016-03-02 NOTE — Telephone Encounter (Signed)
  Oncology Nurse Navigator Documentation  Navigator Location: [facility] (03/02/16 1100)   )Navigator Encounter Type: Telephone (03/02/16 1100) Telephone: Outgoing Call (03/02/16 1100)     Surgery Date: 09/07/15 (03/02/16 1100) Genetic Counseling Date: 09/09/15 (03/02/16 1100) Genetic Counseling Type: Non-Urgent (03/02/16 1100)       Treatment Initiated Date: 09/07/15 (03/02/16 1100) Patient Visit Type: [id number] (03/02/16 1100) Treatment Phase: Final Radiation Tx (03/02/16 1100) Barriers/Navigation Needs: No barriers at this time;No Questions;No Needs (03/02/16 1100)   Interventions: None required (03/02/16 1100)            Acuity: Level 1 (03/02/16 1100)         Time Spent with Patient: 15 (03/02/16 1100)

## 2016-03-07 NOTE — Progress Notes (Signed)
  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 The patient developed mild erythema over the left breast, skin intact, during treatment.  Plan: The patient has completed radiation treatment. The patient will return to radiation oncology clinic for routine followup in one month. I advised them to call or return sooner if they have any questions or concerns related to their recovery or treatment.  -----------------------------------  Eppie Gibson, MD  This document serves as a record of services personally performed by Eppie Gibson, MD. It was created on her behalf by Darcus Austin, a trained medical scribe. The creation of this record is based on the scribe's personal observations and the provider's statements to them. This document has been checked and approved by the attending provider.

## 2016-03-14 ENCOUNTER — Telehealth: Payer: Self-pay | Admitting: Oncology

## 2016-03-14 NOTE — Telephone Encounter (Signed)
Appointment rescheduled per patient request. °

## 2016-03-15 ENCOUNTER — Other Ambulatory Visit: Payer: Medicare Other

## 2016-03-15 ENCOUNTER — Ambulatory Visit: Payer: Medicare Other | Admitting: Oncology

## 2016-03-22 ENCOUNTER — Other Ambulatory Visit (HOSPITAL_BASED_OUTPATIENT_CLINIC_OR_DEPARTMENT_OTHER): Payer: Medicare Other

## 2016-03-22 ENCOUNTER — Ambulatory Visit (HOSPITAL_BASED_OUTPATIENT_CLINIC_OR_DEPARTMENT_OTHER): Payer: Medicare Other | Admitting: Oncology

## 2016-03-22 VITALS — BP 150/82 | HR 68 | Temp 98.2°F | Resp 18 | Ht 61.0 in | Wt 149.5 lb

## 2016-03-22 DIAGNOSIS — C50412 Malignant neoplasm of upper-outer quadrant of left female breast: Secondary | ICD-10-CM

## 2016-03-22 DIAGNOSIS — Z170 Estrogen receptor positive status [ER+]: Secondary | ICD-10-CM

## 2016-03-22 DIAGNOSIS — M8580 Other specified disorders of bone density and structure, unspecified site: Secondary | ICD-10-CM

## 2016-03-22 LAB — CBC WITH DIFFERENTIAL/PLATELET
BASO%: 1.3 % (ref 0.0–2.0)
Basophils Absolute: 0.1 10*3/uL (ref 0.0–0.1)
EOS%: 6.5 % (ref 0.0–7.0)
Eosinophils Absolute: 0.5 10*3/uL (ref 0.0–0.5)
HCT: 41.8 % (ref 34.8–46.6)
HGB: 13.7 g/dL (ref 11.6–15.9)
LYMPH%: 11.3 % — ABNORMAL LOW (ref 14.0–49.7)
MCH: 28.1 pg (ref 25.1–34.0)
MCHC: 32.8 g/dL (ref 31.5–36.0)
MCV: 85.9 fL (ref 79.5–101.0)
MONO#: 0.7 10*3/uL (ref 0.1–0.9)
MONO%: 10.2 % (ref 0.0–14.0)
NEUT#: 5 10*3/uL (ref 1.5–6.5)
NEUT%: 70.7 % (ref 38.4–76.8)
Platelets: 242 10*3/uL (ref 145–400)
RBC: 4.87 10*6/uL (ref 3.70–5.45)
RDW: 13.7 % (ref 11.2–14.5)
WBC: 7 10*3/uL (ref 3.9–10.3)
lymph#: 0.8 10*3/uL — ABNORMAL LOW (ref 0.9–3.3)

## 2016-03-22 LAB — COMPREHENSIVE METABOLIC PANEL
ALT: 37 U/L (ref 0–55)
AST: 30 U/L (ref 5–34)
Albumin: 3.9 g/dL (ref 3.5–5.0)
Alkaline Phosphatase: 87 U/L (ref 40–150)
Anion Gap: 10 mEq/L (ref 3–11)
BUN: 16.8 mg/dL (ref 7.0–26.0)
CO2: 25 mEq/L (ref 22–29)
Calcium: 9.5 mg/dL (ref 8.4–10.4)
Chloride: 108 mEq/L (ref 98–109)
Creatinine: 0.8 mg/dL (ref 0.6–1.1)
EGFR: 70 mL/min/{1.73_m2} — ABNORMAL LOW (ref >90)
Glucose: 113 mg/dl (ref 70–140)
Potassium: 4.2 mEq/L (ref 3.5–5.1)
Sodium: 143 mEq/L (ref 136–145)
Total Bilirubin: 0.24 mg/dL (ref 0.20–1.20)
Total Protein: 7.2 g/dL (ref 6.4–8.3)

## 2016-03-22 MED ORDER — TAMOXIFEN CITRATE 20 MG PO TABS: 20.0000 mg | ORAL_TABLET | Freq: Every day | ORAL | 12 refills | Status: AC

## 2016-03-22 NOTE — Progress Notes (Signed)
[place]  [phone] Fax:(336) [phone]     ID: [name] DOB: [birth year]  MR#: [id number]  ZJQ#:[id number]  Patient Care Team: Stephanie Post, MD as PCP - General Stephanie Cruel, MD as Consulting Physician (Oncology) Stephanie Confer, MD as Consulting Physician (General Surgery) Stephanie Gibson, MD as Attending Physician (Radiation Oncology) Stephanie Campbell, MD as Consulting Physician (Gastroenterology) Stephanie Monarch, MD as Consulting Physician (Dermatology) PCP: Stephanie Post, MD OTHER MD:  CHIEF COMPLAINT: Estrogen receptor positive breast cancer  CURRENT TREATMENT: Tamoxifen   BREAST CANCER HISTORY: From the original intake note:  [name] had bilateral screening mammography with tomography scheduled routinely 08/01/2015 at the [facility]. This showed a possible mass in the left breast. Accordingly on 08/15/2015 she underwent left diagnostic mammography with tomography and ultrasonography. This found the breast density to be category be. On spot compression views and was a 0.9 cm spiculated mass in the upper outer left breast, which on ultrasound was at 2:30 o'clock 5 cm from the nipple and measured 0.8 cm. Ultrasound of the axilla was benign.  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  Her subsequent history is as detailed below  INTERVAL HISTORY: [name] returns today for follow-up of her estrogen receptor positive breast cancer accompanied by her husband [name]. Since the last visit here she completed her radiation treatments. She generally tolerated those well. She kept a good energy level she says, and her skin "held up well. She is now  ready to consider anti-estrogens.  REVIEW OF SYSTEMS: [name] has a little bit of a runny nose, which she tells me she has frequently this time of year. She has mood swings, and she finds the Effexor which she takes for hot flashes is helpful in that regard. She is not exercising regularly at this point. Aside from these issues a detailed review of systems today was stable  PAST MEDICAL HISTORY: Past Medical History:  Diagnosis Date  . Anemia   . Anxiety   . Arthritis   . Atrophic vaginitis   . Cancer [facility])    breast cancer  . Endometriosis   . Family history of breast cancer   . Family history of colon cancer   . GERD (gastroesophageal reflux disease)   . Headache(784.0)   . Heart murmur    as a child only  . Hyperlipidemia   . Hypertension   . Hypothyroidism   . Kidney stones   . Osteopenia   . Osteoporosis   . Rheumatic fever    age 80  . Sleep apnea    " very mild" does not wear CPAP  . Thyroid disease    Hypothyroid  . Ulcer ([place])   . UTI (lower urinary tract infection)     PAST SURGICAL HISTORY: Past Surgical History:  Procedure Laterality Date  . BREAST LUMPECTOMY WITH RADIOACTIVE SEED AND SENTINEL LYMPH NODE BIOPSY Left 09/09/2015   Procedure: LEFT BREAST LUMPECTOMY WITH RADIOACTIVE SEED AND SENTINEL LYMPH NODE BIOPSY;  Surgeon: Stephanie Confer, MD;  Location: [facility];  Service: General;  Laterality: Left;  . IR GENERIC HISTORICAL  12/05/2015   IR CV LINE INJECTION 12/05/2015 Stephanie Killings, MD [facility]-INTERV RAD  . LAPAROSCOPIC ENDOMETRIOSIS FULGURATION  1978  .  PELVIC LAPAROSCOPY    . PORTACATH PLACEMENT N/A 10/04/2015   Procedure: INSERTION PORT-A-CATH WITH ULTRASOUND;  Surgeon: Stephanie Confer, MD;  Location: [facility];  Service: General;  Laterality: N/A;  . TONSILLECTOMY    . ULNAR NERVE REPAIR  2010    FAMILY HISTORY Family History  Problem Relation Age of Onset  . Breast cancer Mother     Age 55  . Hypertension Mother   . Heart disease Father   . Stroke Father     . Colon cancer Father     dx 72s  . Breast cancer Paternal Grandmother     Age 39  . Diabetes Paternal Grandmother   . Heart disease Maternal Grandmother   The patient's father died at age 14 the patient's mother was diagnosed with breast cancer at age 69 and again at age 74, which was the year of her death. In addition a paternal grandmother was diagnosed with breast cancer in her 61s. The patient's father had colon cancer. The patient had 2 brothers, no sisters. There is no history of ovarian cancer in the family.  GYNECOLOGIC HISTORY:  No LMP recorded. Patient is postmenopausal. Menarche age 89. The patient is GX P0. She went through menopause in her late 48s. She did not take hormone replacement. She never used oral contraceptives.  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 Her husband [name] [name]") is retired from [employer]. They have an adopted son, [name], who is completing a second Engineer, production degree at [employer] state area he carries a diagnosis of schizophrenia, but is obviously very functional. The patient has no grandchildren. She is a Nurse, learning disability.  ADVANCED DIRECTIVES:  NOT IN PLACE   HEALTH MAINTENANCE: Social History  Substance Use Topics  . Smoking status: Never Smoker  . Smokeless tobacco: Never Used  . Alcohol use No     Colonoscopy: 2012/[facility]   PAP: 2013?   Bone density: 2012/osteopenia   Lipid panel:  Allergies  Allergen Reactions  . Nitrofurantoin Nausea And Vomiting    GI upset  . Sulfamethoxazole-Trimethoprim Nausea And Vomiting and Other (See Comments)    GI upset    Current Outpatient Prescriptions  Medication Sig Dispense Refill  . acetaminophen (TYLENOL) 500 MG tablet Take 1,000 mg by mouth every 6 (six) hours as needed (For pain.).    Marland Kitchen atenolol (TENORMIN) 50 MG tablet TAKE 1 TABLET (50 MG TOTAL) BY MOUTH DAILY. 90 tablet 2  . chlorhexidine (PERIDEX) 0.12 % solution Use as directed 15 mLs in the mouth or throat 2 (two)  times daily.     . hydrochlorothiazide (HYDRODIURIL) 25 MG tablet TAKE 1 TABLET (25 MG TOTAL) BY MOUTH DAILY. (Patient taking differently: TAKE HALF A TABLET BY MOUTH DAILY.) 30 tablet 11  . levothyroxine (SYNTHROID, LEVOTHROID) 50 MCG tablet TAKE 1 TABLET BY MOUTH DAILY BEFORE BREAKFAST 90 tablet 2  . LORazepam (ATIVAN) 0.5 MG tablet Take 1 tablet (0.5 mg total) by mouth every 8 (eight) hours. 30 tablet 2  . Multiple Vitamin (MULTIVITAMIN WITH MINERALS) TABS tablet Take 1 tablet by mouth daily.    . Naphazoline HCl (CLEAR EYES OP) Place 2 drops into both eyes daily.    Earney Navy Bicarbonate (ZEGERID) 20-1100 MG CAPS capsule Take 1 capsule by mouth daily before breakfast.    . OVER THE COUNTER MEDICATION Take 475 mg by mouth daily. Tumeric 425m    . simvastatin (ZOCOR) 40 MG tablet TAKE 1 TABLET (40 MG TOTAL) BY MOUTH AT BEDTIME. 9[place]  tablet 2  . tamoxifen (NOLVADEX) 20 MG tablet Take 1 tablet (20 mg total) by mouth daily. 90 tablet 12  . traZODone (DESYREL) 50 MG tablet Take 1 tablet (50 mg total) by mouth at bedtime. 30 tablet 4  . venlafaxine XR (EFFEXOR-XR) 150 MG 24 hr capsule TAKE 1 CAPSULE (150 MG TOTAL) BY MOUTH DAILY. 90 capsule 0   No current facility-administered medications for this visit.     OBJECTIVE: Middle-aged white woman Who appears well Vitals:   03/22/16 1150  BP: (!) 150/82  Pulse: 68  Resp: 18  Temp: 98.2 F (36.8 C)     Body mass index is 28.25 kg/m.    ECOG FS:1 - Symptomatic but completely ambulatory  Sclerae unicteric, pupils round and equal Oropharynx clear and moist-- no thrush or other lesions No cervical or supraclavicular adenopathy Lungs no rales or rhonchi Heart regular rate and rhythm Abd soft, nontender, positive bowel sounds MSK no focal spinal tenderness, no upper extremity lymphedema Neuro: nonfocal, well oriented, appropriate affect Breasts: The right breast is unremarkable. The left breast is status Kirby lumpectomy and radiation.  There is still some erythema but this is fast resolving, with no desquamation. There is no evidence of local recurrence. The left axilla is benign.   LAB RESULTS:  CMP     Component Value Date/Time   NA 143 03/22/2016 1120   K 4.2 03/22/2016 1120   CL 109 10/04/2015 0925   CO2 25 03/22/2016 1120   GLUCOSE 113 03/22/2016 1120   BUN 16.8 03/22/2016 1120   CREATININE 0.8 03/22/2016 1120   CALCIUM 9.5 03/22/2016 1120   PROT 7.2 03/22/2016 1120   ALBUMIN 3.9 03/22/2016 1120   AST 30 03/22/2016 1120   ALT 37 03/22/2016 1120   ALKPHOS 87 03/22/2016 1120   BILITOT 0.24 03/22/2016 1120   GFRNONAA >60 10/04/2015 0925   GFRAA >60 10/04/2015 0925    INo results found for: SPEP, UPEP  Lab Results  Component Value Date   WBC 7.0 03/22/2016   NEUTROABS 5.0 03/22/2016   HGB 13.7 03/22/2016   HCT 41.8 03/22/2016   MCV 85.9 03/22/2016   PLT 242 03/22/2016      Chemistry      Component Value Date/Time   NA 143 03/22/2016 1120   K 4.2 03/22/2016 1120   CL 109 10/04/2015 0925   CO2 25 03/22/2016 1120   BUN 16.8 03/22/2016 1120   CREATININE 0.8 03/22/2016 1120      Component Value Date/Time   CALCIUM 9.5 03/22/2016 1120   ALKPHOS 87 03/22/2016 1120   AST 30 03/22/2016 1120   ALT 37 03/22/2016 1120   BILITOT 0.24 03/22/2016 1120       No results found for: LABCA2  No components found for: LABCA125  No results for input(s): INR in the last 168 hours.  Urinalysis    Component Value Date/Time   BILIRUBINUR 1+ 09/28/2014 1124   PROTEINUR 2+ 09/28/2014 1124   UROBILINOGEN 1.0 09/28/2014 1124   NITRITE positive 09/28/2014 1124   LEUKOCYTESUR large (3+) (A) 09/28/2014 1124      ELIGIBLE FOR AVAILABLE RESEARCH PROTOCOL: no  STUDIES: Study Result   EXAM: DUAL X-RAY ABSORPTIOMETRY (DXA) FOR BONE MINERAL DENSITY  IMPRESSION: Referring Physician:  Chauncey Kirby  PATIENT: Name: Stephanie, Kirby Patient ID: [id number] Birth Date: Jul 06, 1940 Height: 60.5  in. Sex: Female Measured: 01/23/2016 Weight: 150.0 lbs. Indications: Advanced Age, Breast Cancer History, Caucasian, Effexor, Estrogen Deficient, Height Loss (781.91), History of  Osteopenia, Levothyroxine, Low Calcium Intake (269.3), Omeprazole, Postmenopausal, scoliosis Fractures: None Treatments: Multivitamin  ASSESSMENT: The BMD measured at Femur Neck Right is 0.751 g/cm2 with a T-score of -2.1. This patient is considered osteopenic according to [facility]) criteria. Lumbar spine was not utilized due to scoliosis. Per the official positions of the ISCD, it is not possible to quantitatively compare BMD or calculate an [facility] between exams done at different facilities or on different devices.  Site Region Measured Date Measured Age YA BMD Significant CHANGE T-score DualFemur Neck Right 01/23/2016 75.7 -2.1 0.751 g/cm2  Left Forearm Radius 33% 01/23/2016 75.7 -1.0 0.801 g/cm2  World Health Organization [facility]) criteria for Kirby-menopausal, Caucasian Women: Normal       T-score at or above -1 SD Osteopenia   T-score between -1 and -2.5 SD Osteoporosis T-score at or below -2.5 SD  RECOMMENDATION: [name] recommends that FDA-approved medical therapies be considered in postmenopausal women and men age 43 or older with a:  1. Hip or vertebral (clinical or morphometric) fracture. 2. T-score of <-2.5 at the spine or hip. 3. Ten-year fracture probability by FRAX of 3% or greater for hip fracture or 20% or greater for major osteoporotic fracture.  All treatment decisions require clinical judgment and consideration of individual patient factors, including patient preferences, co-morbidities, previous drug use, risk factors not captured in the FRAX model (e.g. falls, vitamin D deficiency, increased bone turnover, interval significant decline in bone density) and possible under - or over-estimation of fracture risk by FRAX.  All patients  should ensure an adequate intake of dietary calcium (1200 mg/d) and vitamin D (800 IU daily) unless contraindicated.  FOLLOW-UP: People with diagnosed cases of osteoporosis or at high risk for fracture should have regular bone mineral density tests. For patients eligible for Medicare, routine testing is allowed once every 2 years. The testing frequency can be increased to one year for patients who have rapidly progressing disease, those who are receiving or discontinuing medical therapy to restore bone mass, or have additional risk factors.  I have reviewed this report, and agree with the above findings.  [facility]  FRAX* 10-year Probability of Fracture Based on femoral neck BMD: DualFemur (Right)  Major Osteoporotic Fracture: 13.8% Hip Fracture:                3.7% Population:                  [place] (Caucasian) Risk Factors:                None  *FRAX is a Materials engineer of the [employer] for Metabolic Bone Disease, a World Pharmacologist (WHO) [facility]. ASSESSMENT:  The probability of a major osteoporotic fracture is 13.8% within the next ten years.  The probability of a hip fracture is 3.7% within the next ten years.   Electronically Signed   By: Marijo Conception, M.D.   On: 01/23/2016 13:09     ASSESSMENT: 75 y.o. [place] woman status Kirby left breast upper outer quadrant biopsy 08/19/2015 for a clinical T1b N0, stage IA invasive ductal carcinoma, strongly estrogen and progesterone receptor positive, HER-2 not amplified, with an MIB-1 of 10%  (1) left lumpectomy and sentinel lymph node sampling 09/09/2015 showed apT1b pN0, stage IA invasive ductal carcinoma, grade 1, repeat HER-2 again negative.   (2) Oncotype DX score of 29 predicts a 19% risk of recurrence outside the breast within the next 10 years  if the patient's only systemic therapy is tamoxifen for 5 years. It also predicts a further risk  reduction with chemotherapy of 8%.  (3) adjuvant chemotherapy will consist of cyclophosphamide and docetaxel every 21 days 4, started 10/24/2015, completed 12/26/2015.  (4) adjuvant radiation 01/30/16 - 03/02/16 Site/dose:   Left breast: 50 Gy in 25 fractions  (5) To start tamoxifen 04/16/2016  (a) bone density October 2017 shows a T score of -2.1 (osteopenia)  (6) genetics testing 09/19/2015 through the Breast/Ovarian gene panel offered by [facility] found no deleterious mutations in ATM, BARD1, BRCA1, BRCA2, BRIP1, CDH1, CHEK2, EPCAM, FANCC, MLH1, MSH2, MSH6, NBN, PALB2, PMS2, PTEN, RAD51C, RAD51D, TP53, and XRCC2.  PLAN: [name]  has completed local treatment for her breast cancer and is now ready to start anti-estrogens. We discussed the difference between tamoxifen and anastrozole in detail. She understands that anastrozole and the aromatase inhibitors in general work by blocking estrogen production. Accordingly vaginal dryness, decrease in bone density, and of course hot flashes can result. The aromatase inhibitors can also negatively affect the cholesterol profile, although that is a minor effect. One out of 5 women on aromatase inhibitors we will feel "old and achy". This arthralgia/myalgia syndrome, which resembles fibromyalgia clinically, does resolve with stopping the medications. Accordingly this is not a reason to not try an aromatase inhibitor but it is a frequent reason to stop it (in other words 20% of women will not be able to tolerate these medications).  Tamoxifen on the other hand does not block estrogen production. It does not "take away a woman's estrogen". It blocks the estrogen receptor in breast cells. Like anastrozole, it can also cause hot flashes. As opposed to anastrozole, tamoxifen has many estrogen-like effects. It is technically an estrogen receptor modulator. This means that in some tissues tamoxifen works like estrogen-- for example it helps strengthen the bones. It tends  to improve the cholesterol profile. It can cause thickening of the endometrial lining, and even endometrial polyps or rarely cancer of the uterus.(The risk of uterine cancer due to tamoxifen is one additional cancer per thousand women year). It can cause vaginal wetness or stickiness. It can cause blood clots through this estrogen-like effect--the risk of blood clots with tamoxifen is exactly the same as with birth control pills or hormone replacement.  Neither of these agents causes mood changes or weight gain, despite the popular belief that they can have these side effects. We have data from studies comparing either of these drugs with placebo, and in those cases the control group had the same amount of weight gain and depression as the group that took the drug.  After all this discussion and particularly in view of her significant osteopenia she is going to start tamoxifen at January 1. I'm going to see her after she has been on the drug for a few months to make sure she is tolerating it well, and if she is then the plan will be to continue that for a total of 5 years.  I have encouraged her to start an exercise program, particularly walking, and to start vitamin D supplementation.  She knows to call for any problems that may develop before the next visit here.        :Stephanie Cruel, MD   03/24/2016 8:50 AM Medical Oncology and Hematology [facility] [place], [place] [address] Tel. [phone]    Fax. [phone]

## 2016-04-04 ENCOUNTER — Encounter: Payer: Self-pay | Admitting: Radiation Oncology

## 2016-04-06 ENCOUNTER — Ambulatory Visit
Admission: RE | Admit: 2016-04-06 | Discharge: 2016-04-06 | Disposition: A | Discharge status: Home / Self Care | Payer: Medicare Other | Source: Ambulatory Visit | Attending: Radiation Oncology | Admitting: Radiation Oncology

## 2016-04-06 ENCOUNTER — Encounter: Payer: Self-pay | Admitting: Radiation Oncology

## 2016-04-06 DIAGNOSIS — Z79899 Other long term (current) drug therapy: Secondary | ICD-10-CM | POA: Insufficient documentation

## 2016-04-06 DIAGNOSIS — Z5189 Encounter for other specified aftercare: Secondary | ICD-10-CM | POA: Diagnosis not present

## 2016-04-06 DIAGNOSIS — Z923 Personal history of irradiation: Secondary | ICD-10-CM | POA: Diagnosis not present

## 2016-04-06 DIAGNOSIS — C50412 Malignant neoplasm of upper-outer quadrant of left female breast: Secondary | ICD-10-CM | POA: Insufficient documentation

## 2016-04-06 DIAGNOSIS — Z170 Estrogen receptor positive status [ER+]: Secondary | ICD-10-CM | POA: Diagnosis not present

## 2016-04-06 HISTORY — DX: Personal history of irradiation: Z92.3

## 2016-04-06 NOTE — Progress Notes (Signed)
Radiation Oncology         [phone] ________________________________  Name: [name] MRN: [id number]  Date: 04/06/2016  DOB: 01-Apr-1941  Follow-Up Visit Note  Outpatient  CC: [name], MD  Jackolyn Confer, MD  Diagnosis and Prior Radiotherapy:    ICD-9-CM ICD-10-CM   1. 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 She is doing well overall. She denies pain or fatigue. She does report an occasional sharp pain to her left breast. She reports the skin to her radiation site has healed. She has stopped using the radiaplex because her skin felt better. She is aware to begin using Vitamin E cream to help keep the skin to her left breast healthy. She will begin taking Tamoxifen on January 1st 2018. She will follow up in March with Dr. [name].                               ALLERGIES:  is allergic to nitrofurantoin and sulfamethoxazole-trimethoprim.  Meds: Current Outpatient Prescriptions  Medication Sig Dispense Refill  . acetaminophen (TYLENOL) 500 MG tablet Take 1,000 mg by mouth every 6 (six) hours as needed (For pain.).    Marland Kitchen atenolol (TENORMIN) 50 MG tablet TAKE 1 TABLET (50 MG TOTAL) BY MOUTH DAILY. 90 tablet 2  . chlorhexidine (PERIDEX) 0.12 % solution Use as directed 15 mLs in the mouth or throat 2 (two) times daily.     . hydrochlorothiazide (HYDRODIURIL) 25 MG tablet TAKE 1 TABLET (25 MG TOTAL) BY MOUTH DAILY. (Patient taking differently: TAKE HALF A TABLET BY MOUTH  DAILY.) 30 tablet 11  . levothyroxine (SYNTHROID, LEVOTHROID) 50 MCG tablet TAKE 1 TABLET BY MOUTH DAILY BEFORE BREAKFAST 90 tablet 2  . Omeprazole-Sodium Bicarbonate (ZEGERID) 20-1100 MG CAPS capsule Take 1 capsule by mouth daily before breakfast.    . simvastatin (ZOCOR) 40 MG tablet TAKE 1 TABLET (40 MG TOTAL) BY MOUTH AT BEDTIME. 90 tablet 2  . traZODone (DESYREL) 50 MG tablet Take 1 tablet (50 mg total) by mouth at bedtime. 30 tablet 4  . venlafaxine XR (EFFEXOR-XR) 150 MG 24 hr capsule TAKE 1 CAPSULE (150 MG TOTAL) BY MOUTH DAILY. 90 capsule 0  . LORazepam (ATIVAN) 0.5 MG tablet Take 1 tablet (0.5 mg total) by mouth every 8 (eight) hours. (Patient not taking: Reported on 04/06/2016) 30 tablet 2  . Multiple Vitamin (MULTIVITAMIN WITH MINERALS) TABS tablet Take 1 tablet by mouth daily.    . Naphazoline HCl (CLEAR EYES OP) Place 2 drops into both eyes daily.    Marland Kitchen OVER THE COUNTER MEDICATION Take 475 mg by mouth daily. Tumeric 457m    . tamoxifen (NOLVADEX) 20 MG tablet Take 1 tablet (20 mg total) by mouth daily. (Patient not taking: Reported on 04/06/2016) 90 tablet 12   No current facility-administered medications for this encounter.     Physical Findings:  height is 5' 1"  (1.549 m)  and weight is 148 lb (67.1 kg). Her temperature is 99.1 F (37.3 C). Her blood pressure is 121/56 (abnormal) and her pulse is 70. Her oxygen saturation is 97%. .    General: Alert and oriented, in no acute distress HEENT: Head is normocephalic.  Neurologic: Speech is fluent. Coordination is intact. Psychiatric: Judgment and insight are intact. Affect is appropriate. Breast: Slight erythema remaining over the left breast. A little bit of dry superficial flaking skin over the central left breast.   Lab Findings: Lab Results  Component Value Date   WBC 7.0 03/22/2016   HGB 13.7 03/22/2016   HCT 41.8 03/22/2016   MCV 85.9 03/22/2016   PLT 242 03/22/2016    Radiographic Findings: No results  found.  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  I encouraged her to continue with yearly mammography and followup with medical oncology. I encouraged the patient to begin using Vitamin E lotion to promote further healing of the skin. I will see her back on an as-needed basis. I have encouraged her to call if she has any issues or concerns in the future. I wished her the very best.  _____________________________________   Eppie Gibson, MD   This document serves as a record of services personally performed by Eppie Gibson, MD. It was created on her behalf by Arlyce Harman, a trained medical scribe. The creation of this record is based on the scribe's personal observations and the provider's statements to them. This document has been checked and approved by the attending provider.

## 2016-04-06 NOTE — Progress Notes (Signed)
[name] presents for follow up of radiation completed 03/02/16 to her Left Breast. She denies pain or fatigue. She does report an occasional sharp pain to her Left Breast. She reports the skin to her Radiation site has healed. She has stopped using the Radiaplex. She is aware to begin using Vitamin E cream to help keep the skin to her Left Breast healthy. She will begin taking Tamoxifen on January 1st 2018. She will follow up in March with Dr. [name].   BP (!) 121/56   Pulse 70   Temp 99.1 F (37.3 C)   Ht 5\' 1"  (1.549 m)   Wt 148 lb (67.1 kg)   SpO2 97% Comment: room air  BMI 27.96 kg/m    Wt Readings from Last 3 Encounters:  04/06/16 148 lb (67.1 kg)  03/22/16 149 lb 8 oz (67.8 kg)  02/27/16 147 lb 12.8 oz (67 kg)

## 2016-04-17 DIAGNOSIS — Z853 Personal history of malignant neoplasm of breast: Secondary | ICD-10-CM | POA: Diagnosis not present

## 2016-04-28 ENCOUNTER — Other Ambulatory Visit: Payer: Self-pay | Admitting: Family Medicine

## 2016-07-03 ENCOUNTER — Telehealth: Payer: Self-pay | Admitting: Adult Health

## 2016-07-03 NOTE — Telephone Encounter (Signed)
Unable to reach patient left message with appt time and date. Sent appt letter.

## 2016-07-12 ENCOUNTER — Ambulatory Visit (HOSPITAL_BASED_OUTPATIENT_CLINIC_OR_DEPARTMENT_OTHER): Payer: Medicare Other | Admitting: Oncology

## 2016-07-12 ENCOUNTER — Other Ambulatory Visit (HOSPITAL_BASED_OUTPATIENT_CLINIC_OR_DEPARTMENT_OTHER): Payer: Medicare Other

## 2016-07-12 VITALS — BP 114/50 | HR 68 | Temp 98.4°F | Resp 17 | Ht 61.0 in | Wt 145.2 lb

## 2016-07-12 DIAGNOSIS — Z79811 Long term (current) use of aromatase inhibitors: Secondary | ICD-10-CM | POA: Diagnosis not present

## 2016-07-12 DIAGNOSIS — M8580 Other specified disorders of bone density and structure, unspecified site: Secondary | ICD-10-CM | POA: Diagnosis not present

## 2016-07-12 DIAGNOSIS — C50412 Malignant neoplasm of upper-outer quadrant of left female breast: Secondary | ICD-10-CM | POA: Diagnosis not present

## 2016-07-12 DIAGNOSIS — Z170 Estrogen receptor positive status [ER+]: Secondary | ICD-10-CM

## 2016-07-12 LAB — COMPREHENSIVE METABOLIC PANEL
ALT: 15 U/L (ref 0–55)
AST: 18 U/L (ref 5–34)
Albumin: 4.3 g/dL (ref 3.5–5.0)
Alkaline Phosphatase: 56 U/L (ref 40–150)
Anion Gap: 9 mEq/L (ref 3–11)
BUN: 36 mg/dL — ABNORMAL HIGH (ref 7.0–26.0)
CO2: 29 mEq/L (ref 22–29)
Calcium: 9.7 mg/dL (ref 8.4–10.4)
Chloride: 102 mEq/L (ref 98–109)
Creatinine: 1.2 mg/dL — ABNORMAL HIGH (ref 0.6–1.1)
EGFR: 44 mL/min/{1.73_m2} — ABNORMAL LOW (ref >90)
Glucose: 78 mg/dl (ref 70–140)
Potassium: 4.4 mEq/L (ref 3.5–5.1)
Sodium: 140 mEq/L (ref 136–145)
Total Bilirubin: 0.41 mg/dL (ref 0.20–1.20)
Total Protein: 7 g/dL (ref 6.4–8.3)

## 2016-07-12 LAB — CBC WITH DIFFERENTIAL/PLATELET
BASO%: 0.9 % (ref 0.0–2.0)
Basophils Absolute: 0.1 10*3/uL (ref 0.0–0.1)
EOS%: 4.8 % (ref 0.0–7.0)
Eosinophils Absolute: 0.4 10*3/uL (ref 0.0–0.5)
HCT: 39.9 % (ref 34.8–46.6)
HGB: 13.6 g/dL (ref 11.6–15.9)
LYMPH%: 14.9 % (ref 14.0–49.7)
MCH: 29.2 pg (ref 25.1–34.0)
MCHC: 34.2 g/dL (ref 31.5–36.0)
MCV: 85.3 fL (ref 79.5–101.0)
MONO#: 0.9 10*3/uL (ref 0.1–0.9)
MONO%: 10.4 % (ref 0.0–14.0)
NEUT#: 6.1 10*3/uL (ref 1.5–6.5)
NEUT%: 69 % (ref 38.4–76.8)
Platelets: 227 10*3/uL (ref 145–400)
RBC: 4.68 10*6/uL (ref 3.70–5.45)
RDW: 14.3 % (ref 11.2–14.5)
WBC: 8.8 10*3/uL (ref 3.9–10.3)
lymph#: 1.3 10*3/uL (ref 0.9–3.3)

## 2016-07-12 MED ORDER — TRAZODONE HCL 50 MG PO TABS: 50.0000 mg | ORAL_TABLET | Freq: Every day | ORAL | 4 refills | Status: DC

## 2016-07-12 MED ORDER — TAMOXIFEN CITRATE 20 MG PO TABS: 20.0000 mg | ORAL_TABLET | Freq: Every day | ORAL | 12 refills | Status: DC

## 2016-07-12 NOTE — Progress Notes (Signed)
[place]  [phone] [phone]     ID: [name] DOB: 02/09/1941  MR#: [id number]  QPY#:[id number]  Patient Care Team: [name], MD as PCP - General Chauncey Cruel, MD as Consulting Physician (Oncology) Jackolyn Confer, MD as Consulting Physician (General Surgery) Eppie Gibson, MD as Attending Physician (Radiation Oncology) Richmond Campbell, MD as Consulting Physician (Gastroenterology) Lavonna Monarch, MD as Consulting Physician (Dermatology) PCP: [name], MD OTHER MD:  CHIEF COMPLAINT: Estrogen receptor positive breast cancer  CURRENT TREATMENT: Tamoxifen   BREAST CANCER HISTORY: From the original intake note:  [name] had bilateral screening mammography with tomography scheduled routinely 08/01/2015 at the [facility]. This showed a possible mass in the left breast. Accordingly on 08/15/2015 she underwent left diagnostic mammography with tomography and ultrasonography. This found the breast density to be category be. On spot compression views and was a 0.9 cm spiculated mass in the upper outer left breast, which on ultrasound was at 2:30 o'clock 5 cm from the nipple and measured 0.8 cm. Ultrasound of the axilla was benign.  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  Her subsequent history is as detailed below  INTERVAL HISTORY: [name] returns today for follow-up of her estrogen receptor positive breast cancer, accompanied by [name] her husband. She started tamoxifen in January, with a great deal of trepidation since she felt she would have every single side effect listed on the handout. However she has had no side  effects and actually has noted no change at all in her sense of well-being, functional status, or overall behavior. In's particular she has had no problems with hot flashes, night sweats, or vaginal wetness. She is obtaining the drug at a good price.  REVIEW OF SYSTEMS: [name] walks outside and good weather days and sometimes does some exercises at home. She does not belong to a gym. She is beginning to think about swimming. Her hair has come in strong, salt-and-pepper with a lot of salt, and she says her husband likes it. In general she feels back to normal or even better than normal at this point. She feels she can do more things now that she could before she had this diagnosis. A detailed review of systems today was otherwise noncontributory  PAST MEDICAL HISTORY: Past Medical History:  Diagnosis Date  . Anemia   . Anxiety   . Arthritis   . Atrophic vaginitis   . Cancer [facility])    breast cancer  . Endometriosis   . Family history of breast cancer   . Family history of colon cancer   . GERD (gastroesophageal reflux disease)   . Headache(784.0)   . Heart murmur    as a child only  . History of radiation therapy 01/30/16- 03/02/16   Left Breast 50 Gy in 25 fractions.   . Hyperlipidemia   . Hypertension   . Hypothyroidism   . Kidney stones   . Osteopenia   . Osteoporosis   . Rheumatic fever    age 35  . Sleep apnea    " very mild" does not wear CPAP  . Thyroid disease    Hypothyroid  . Ulcer ([place])   . UTI (lower urinary tract infection)  PAST SURGICAL HISTORY: Past Surgical History:  Procedure Laterality Date  . BREAST LUMPECTOMY WITH RADIOACTIVE SEED AND SENTINEL LYMPH NODE BIOPSY Left 09/09/2015   Procedure: LEFT BREAST LUMPECTOMY WITH RADIOACTIVE SEED AND SENTINEL LYMPH NODE BIOPSY;  Surgeon: Todd Rosenbower, MD;  Location: [facility] OR;  Service: General;  Laterality: Left;  . IR GENERIC HISTORICAL  12/05/2015   IR CV LINE INJECTION 12/05/2015 Arthur Hoss, MD [facility]-INTERV RAD  .  LAPAROSCOPIC ENDOMETRIOSIS FULGURATION  1978  . PELVIC LAPAROSCOPY    . PORTACATH PLACEMENT N/A 10/04/2015   Procedure: INSERTION PORT-A-CATH WITH ULTRASOUND;  Surgeon: Todd Rosenbower, MD;  Location: [facility] OR;  Service: General;  Laterality: N/A;  . TONSILLECTOMY    . ULNAR NERVE REPAIR  2010    FAMILY HISTORY Family History  Problem Relation Age of Onset  . Breast cancer Mother     Age 51  . Hypertension Mother   . Heart disease Father   . Stroke Father   . Colon cancer Father     dx 70s  . Breast cancer Paternal Grandmother     Age 70  . Diabetes Paternal Grandmother   . Heart disease Maternal Grandmother   The patient's father died at age 92 the patient's mother was diagnosed with breast cancer at age 50 and again at age 83, which was the year of her death. In addition a paternal grandmother was diagnosed with breast cancer in her 70s. The patient's father had colon cancer. The patient had 2 brothers, no sisters. There is no history of ovarian cancer in the family.  GYNECOLOGIC HISTORY:  No LMP recorded. Patient is postmenopausal. Menarche age 12. The patient is GX P0. She went through menopause in her late 40s. She did not take hormone replacement. She never used oral contraceptives.  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 Her husband [name] (" Don") is retired from [employer]. They have an adopted son, [name], who is completing a second engineering degree at [employer] he carries a diagnosis of schizophrenia, but is obviously very functional. The patient has no grandchildren. She is a Catholic.  ADVANCED DIRECTIVES:  NOT IN PLACE   HEALTH MAINTENANCE: Social History  Substance Use Topics  . Smoking status: Never Smoker  . Smokeless tobacco: Never Used  . Alcohol use No     Colonoscopy: 2012/[facility]   PAP: 2013?   Bone density: 2012/osteopenia   Lipid panel:  Allergies  Allergen Reactions  . Nitrofurantoin Nausea And Vomiting    GI  upset  . Sulfamethoxazole-Trimethoprim Nausea And Vomiting and Other (See Comments)    GI upset    Current Outpatient Prescriptions  Medication Sig Dispense Refill  . acetaminophen (TYLENOL) 500 MG tablet Take 1,000 mg by mouth every 6 (six) hours as needed (For pain.).    . atenolol (TENORMIN) 50 MG tablet TAKE 1 TABLET (50 MG TOTAL) BY MOUTH DAILY. 90 tablet 2  . chlorhexidine (PERIDEX) 0.12 % solution Use as directed 15 mLs in the mouth or throat 2 (two) times daily.     . hydrochlorothiazide (HYDRODIURIL) 25 MG tablet TAKE 1 TABLET (25 MG TOTAL) BY MOUTH DAILY. (Patient taking differently: TAKE HALF A TABLET BY MOUTH DAILY.) 30 tablet 11  . levothyroxine (SYNTHROID, LEVOTHROID) 50 MCG tablet TAKE 1 TABLET BY MOUTH DAILY BEFORE BREAKFAST 90 tablet 2  . Multiple Vitamin (MULTIVITAMIN WITH MINERALS) TABS tablet Take 1 tablet by mouth daily.    . Omeprazole-Sodium Bicarbonate (ZEGERID) 20-1100 MG CAPS capsule Take 1   capsule by mouth daily before breakfast.    . OVER THE COUNTER MEDICATION Take 475 mg by mouth daily. Tumeric 436m    . simvastatin (ZOCOR) 40 MG tablet TAKE 1 TABLET (40 MG TOTAL) BY MOUTH AT BEDTIME. 90 tablet 2  . tamoxifen (NOLVADEX) 20 MG tablet Take 1 tablet (20 mg total) by mouth daily. 90 tablet 12  . traZODone (DESYREL) 50 MG tablet Take 1 tablet (50 mg total) by mouth at bedtime. 30 tablet 4  . venlafaxine XR (EFFEXOR-XR) 150 MG 24 hr capsule TAKE 1 CAPSULE (150 MG TOTAL) BY MOUTH DAILY. 90 capsule 1   No current facility-administered medications for this visit.     OBJECTIVE: Middle-aged white woman In no acute distress Vitals:   07/12/16 1110  BP: (!) 114/50  Pulse: 68  Resp: 17  Temp: 98.4 F (36.9 C)     Body mass index is 27.44 kg/m.    ECOG FS:0 - Asymptomatic  Sclerae unicteric, EOMs intact Oropharynx clear and moist No cervical or supraclavicular adenopathy Lungs no rales or rhonchi Heart regular rate and rhythm Abd soft, nontender, positive bowel  sounds MSK no focal spinal tenderness, no upper extremity lymphedema Neuro: nonfocal, well oriented, appropriate affect Breasts: The right breast is benign. The left breast as undergone lumpectomy and radiation with no evidence of local recurrence. Both axillae are benign.   LAB RESULTS:  CMP     Component Value Date/Time   NA 143 03/22/2016 1120   K 4.2 03/22/2016 1120   CL 109 10/04/2015 0925   CO2 25 03/22/2016 1120   GLUCOSE 113 03/22/2016 1120   BUN 16.8 03/22/2016 1120   CREATININE 0.8 03/22/2016 1120   CALCIUM 9.5 03/22/2016 1120   PROT 7.2 03/22/2016 1120   ALBUMIN 3.9 03/22/2016 1120   AST 30 03/22/2016 1120   ALT 37 03/22/2016 1120   ALKPHOS 87 03/22/2016 1120   BILITOT 0.24 03/22/2016 1120   GFRNONAA >60 10/04/2015 0925   GFRAA >60 10/04/2015 0925    INo results found for: SPEP, UPEP  Lab Results  Component Value Date   WBC 8.8 07/12/2016   NEUTROABS 6.1 07/12/2016   HGB 13.6 07/12/2016   HCT 39.9 07/12/2016   MCV 85.3 07/12/2016   PLT 227 07/12/2016      Chemistry      Component Value Date/Time   NA 143 03/22/2016 1120   K 4.2 03/22/2016 1120   CL 109 10/04/2015 0925   CO2 25 03/22/2016 1120   BUN 16.8 03/22/2016 1120   CREATININE 0.8 03/22/2016 1120      Component Value Date/Time   CALCIUM 9.5 03/22/2016 1120   ALKPHOS 87 03/22/2016 1120   AST 30 03/22/2016 1120   ALT 37 03/22/2016 1120   BILITOT 0.24 03/22/2016 1120       No results found for: LABCA2  No components found for: LABCA125  No results for input(s): INR in the last 168 hours.  Urinalysis    Component Value Date/Time   BILIRUBINUR 1+ 09/28/2014 1124   PROTEINUR 2+ 09/28/2014 1124   UROBILINOGEN 1.0 09/28/2014 1124   NITRITE positive 09/28/2014 1124   LEUKOCYTESUR large (3+) (A) 09/28/2014 1124      ELIGIBLE FOR AVAILABLE RESEARCH PROTOCOL: no  STUDIES: The repeat mammography due in April.  ASSESSMENT: 89y.o. [name] woman status post left breast upper  outer quadrant biopsy 08/19/2015 for a clinical T1b N0, stage IA invasive ductal carcinoma, strongly estrogen and progesterone receptor positive, HER-2 not amplified, with  an MIB-1 of 10%  (1) left lumpectomy and sentinel lymph node sampling 09/09/2015 showed apT1b pN0, stage IA invasive ductal carcinoma, grade 1, repeat HER-2 again negative.   (2) Oncotype DX score of 29 predicts a 19% risk of recurrence outside the breast within the next 10 years if the patient's only systemic therapy is tamoxifen for 5 years. It also predicts a further risk reduction with chemotherapy of 8%.  (3) adjuvant chemotherapy consisting of cyclophosphamide and docetaxel every 21 days 4, started 10/24/2015, completed 12/26/2015.  (4) adjuvant radiation 01/30/16 - 03/02/16 Site/dose:   Left breast: 50 Gy in 25 fractions  (5) started tamoxifen 04/16/2016  (a) bone density October 2017 shows a T score of -2.1 (osteopenia)  (6) genetics testing 09/19/2015 through the Breast/Ovarian gene panel offered by [facility] found no deleterious mutations in ATM, BARD1, BRCA1, BRCA2, BRIP1, CDH1, CHEK2, EPCAM, FANCC, MLH1, MSH2, MSH6, NBN, PALB2, PMS2, PTEN, RAD51C, RAD51D, TP53, and XRCC2.  PLAN: I spent approximately 30 minutes with [name] with most of that time spent discussing her general situation and prognosis. [name] will soon be a year out from definitive surgery for her breast cancer, with no evidence of disease recurrence. This is favorable.  She is tolerating tamoxifen remarkably well. We again reviewed the possible toxicities, side effects and complications of this agent. The plan will be to continue that for a total of 5 years.  I have encouraged her to exercise more regularly. She is considering swimming since her hair is short right now. Anything that she can do 5 days a week I would be fine. She understands we recommend 45 minutes at a time if she can do it that long and just enough exercise to swell a little.  She is  going to see me again in 6 months. She knows to call for any problems that may develop before that     :[name],GUSTAV C, MD   07/12/2016 11:34 AM Medical Oncology and Hematology [facility] [address] [place],  [address] Tel. [phone]    Fax. [phone] 

## 2016-07-21 ENCOUNTER — Encounter (HOSPITAL_COMMUNITY): Payer: Self-pay | Admitting: *Deleted

## 2016-07-21 ENCOUNTER — Emergency Department (HOSPITAL_COMMUNITY)
Admission: EM | Admit: 2016-07-21 | Discharge: 2016-07-21 | Disposition: A | Discharge status: Home / Self Care | Payer: Medicare Other | Attending: Emergency Medicine | Admitting: Emergency Medicine

## 2016-07-21 DIAGNOSIS — I10 Essential (primary) hypertension: Secondary | ICD-10-CM | POA: Diagnosis not present

## 2016-07-21 DIAGNOSIS — H539 Unspecified visual disturbance: Secondary | ICD-10-CM

## 2016-07-21 DIAGNOSIS — H538 Other visual disturbances: Secondary | ICD-10-CM | POA: Diagnosis not present

## 2016-07-21 DIAGNOSIS — Z79899 Other long term (current) drug therapy: Secondary | ICD-10-CM | POA: Insufficient documentation

## 2016-07-21 DIAGNOSIS — Z853 Personal history of malignant neoplasm of breast: Secondary | ICD-10-CM | POA: Insufficient documentation

## 2016-07-21 DIAGNOSIS — E039 Hypothyroidism, unspecified: Secondary | ICD-10-CM | POA: Insufficient documentation

## 2016-07-21 MED ORDER — FLUORESCEIN SODIUM 1 MG OP STRP
ORAL_STRIP | OPHTHALMIC | Status: AC
  Administered 2016-07-21: 1
  Filled 2016-07-21: qty 1

## 2016-07-21 MED ORDER — TETRACAINE HCL 0.5 % OP SOLN
2.0000 [drp] | Freq: Once | OPHTHALMIC | Status: AC
  Administered 2016-07-21: 2 [drp] via OPHTHALMIC
  Filled 2016-07-21: qty 4

## 2016-07-21 NOTE — ED Provider Notes (Signed)
[facility] DEPT Provider Note   CSN: [id number] Arrival date & time: 07/21/16  1137     History   Chief Complaint Chief Complaint  Patient presents with  . Eye Problem  . Blurred Vision    HPI [name] is a 76 y.o. female.  HPI   Pt with hx Breast CA on tamoxifen, HTN, HLD p/w change in vision in left eye that occurred this morning around 11am.  States she was walking down the hall when she suddenly developed thin vertical reddish black lines in the vision of her left eye.  Several of the lines gradually went away and a few have come back.  Currently she has one that alternates between being straight and wavy.  She has also intermittently had blurry vision in that eye, not currently.  Denies any fevers, trauma, eye pain.  She does not wear contacts.  Has had "something" that was monitored with her retina many years ago but she was then released.  Eye Dt is Dr Clent Demark of [place].     Past Medical History:  Diagnosis Date  . Anemia   . Anxiety   . Arthritis   . Atrophic vaginitis   . Cancer [facility])    breast cancer  . Endometriosis   . Family history of breast cancer   . Family history of colon cancer   . GERD (gastroesophageal reflux disease)   . Headache(784.0)   . Heart murmur    as a child only  . History of radiation therapy 01/30/16- 03/02/16   Left Breast 50 Gy in 25 fractions.   . Hyperlipidemia   . Hypertension   . Hypothyroidism   . Kidney stones   . Osteopenia   . Osteoporosis   . Rheumatic fever    age 46  . Sleep apnea    " very mild" does not wear CPAP  . Thyroid disease    Hypothyroid  . Ulcer ([place])   . UTI (lower urinary tract infection)     Patient Active Problem List   Diagnosis Date Noted  . Genetic testing 09/23/2015  . Malignant neoplasm of upper-outer quadrant of left breast in female, estrogen receptor positive ([place]) 09/07/2015  . Family history of breast cancer   . Family history of colon cancer   . Prediabetes  08/30/2014  . Endometriosis   . Osteoporosis   . Hypertension   . UTI (lower urinary tract infection) 03/05/2011  . Dysuria 03/05/2011  . Hypothyroidism 06/06/2009  . FATIGUE 06/06/2009  . ELEVATED BLOOD PRESSURE 06/06/2009  . OSTEOPENIA 09/09/2008  . SCOLIOSIS 09/09/2008  . Hyperlipidemia 07/30/2008  . MIGRAINE HEADACHE 07/30/2008  . GERD 07/30/2008  . ENTHESOPATHY OF HIP REGION 07/30/2008  . HEADACHE 07/30/2008    Past Surgical History:  Procedure Laterality Date  . BREAST LUMPECTOMY WITH RADIOACTIVE SEED AND SENTINEL LYMPH NODE BIOPSY Left 09/09/2015   Procedure: LEFT BREAST LUMPECTOMY WITH RADIOACTIVE SEED AND SENTINEL LYMPH NODE BIOPSY;  Surgeon: Jackolyn Confer, MD;  Location: [facility];  Service: General;  Laterality: Left;  . IR GENERIC HISTORICAL  12/05/2015   IR CV LINE INJECTION 12/05/2015 [name], MD [facility]-INTERV RAD  . LAPAROSCOPIC ENDOMETRIOSIS FULGURATION  1978  . PELVIC LAPAROSCOPY    . PORTACATH PLACEMENT N/A 10/04/2015   Procedure: INSERTION PORT-A-CATH WITH ULTRASOUND;  Surgeon: Jackolyn Confer, MD;  Location: [facility];  Service: General;  Laterality: N/A;  . TONSILLECTOMY    . ULNAR NERVE REPAIR  2010    OB History  Gravida Para Term Preterm AB Living   0             SAB TAB Ectopic Multiple Live Births                   Home Medications    Prior to Admission medications   Medication Sig Start Date End Date Taking? Authorizing Provider  atenolol (TENORMIN) 50 MG tablet TAKE 1 TABLET (50 MG TOTAL) BY MOUTH DAILY. 02/22/16  Yes [name], MD  chlorhexidine (PERIDEX) 0.12 % solution Use as directed 15 mLs in the mouth or throat 2 (two) times daily.  08/30/15  Yes Historical Provider, MD  hydrochlorothiazide (HYDRODIURIL) 25 MG tablet TAKE 1 TABLET (25 MG TOTAL) BY MOUTH DAILY. Patient taking differently: TAKE HALF A TABLET BY MOUTH DAILY. 09/19/15  Yes [name], MD  levothyroxine (SYNTHROID, LEVOTHROID) 50 MCG tablet TAKE 1 TABLET BY MOUTH DAILY  BEFORE BREAKFAST 03/02/16  Yes [name], MD  Omeprazole-Sodium Bicarbonate (ZEGERID) 20-1100 MG CAPS capsule Take 1 capsule by mouth daily before breakfast.   Yes Historical Provider, MD  OVER THE COUNTER MEDICATION Take 475 mg by mouth daily. Tumeric 475mg    Yes Historical Provider, MD  simvastatin (ZOCOR) 40 MG tablet TAKE 1 TABLET (40 MG TOTAL) BY MOUTH AT BEDTIME. 03/02/16  Yes [name], MD  tamoxifen (NOLVADEX) 20 MG tablet Take 1 tablet (20 mg total) by mouth daily. 07/12/16 08/11/16 Yes Chauncey Cruel, MD  traZODone (DESYREL) 50 MG tablet Take 1 tablet (50 mg total) by mouth at bedtime. 07/12/16  Yes Chauncey Cruel, MD  venlafaxine XR (EFFEXOR-XR) 150 MG 24 hr capsule TAKE 1 CAPSULE (150 MG TOTAL) BY MOUTH DAILY. 04/30/16  Yes [name], MD    Family History Family History  Problem Relation Age of Onset  . Breast cancer Mother     Age 38  . Hypertension Mother   . Heart disease Father   . Stroke Father   . Colon cancer Father     dx 62s  . Breast cancer Paternal Grandmother     Age 14  . Diabetes Paternal Grandmother   . Heart disease Maternal Grandmother     Social History Social History  Substance Use Topics  . Smoking status: Never Smoker  . Smokeless tobacco: Never Used  . Alcohol use No     Allergies   Nitrofurantoin and Sulfamethoxazole-trimethoprim   Review of Systems Review of Systems  All other systems reviewed and are negative.    Physical Exam Updated Vital Signs BP (!) 151/79 (BP Location: Left Arm)   Pulse 73   Temp 98.1 F (36.7 C) (Oral)   Resp 14   SpO2 98%   Physical Exam  Constitutional: She appears well-developed and well-nourished. No distress.  HENT:  Head: Normocephalic and atraumatic.  Eyes: Conjunctivae, EOM and lids are normal. Pupils are equal, round, and reactive to light. Right eye exhibits no discharge. Left eye exhibits no chemosis, no discharge, no exudate and no hordeolum. No foreign body  present in the left eye. Right conjunctiva is not injected. Right conjunctiva has no hemorrhage. Left conjunctiva is not injected. Left conjunctiva has no hemorrhage. No scleral icterus.  Visual field testing is normal.  Left eye pressure checked x 3 - 21, 21,17  Neck: Normal range of motion. Neck supple.  Pulmonary/Chest: Effort normal.  Neurological: She is alert. No cranial nerve deficit. She exhibits normal muscle tone. Coordination normal.  Skin: No  rash noted. She is not diaphoretic.  Psychiatric: She has a normal mood and affect. Her behavior is normal.  Nursing note and vitals reviewed.    ED Treatments / Results  Labs (all labs ordered are listed, but only abnormal results are displayed) Labs Reviewed - No data to display  EKG  EKG Interpretation None       Radiology No results found.  Procedures Procedures (including critical care time)  Medications Ordered in ED Medications  fluorescein 1 MG ophthalmic strip (1 strip  Given 07/21/16 1527)  tetracaine (PONTOCAINE) 0.5 % ophthalmic solution 2 drop (2 drops Both Eyes Given by Other 07/21/16 1319)     Initial Impression / Assessment and Plan / ED Course  I have reviewed the triage vital signs and the nursing notes.  Pertinent labs & imaging results that were available during my care of the patient were reviewed by me and considered in my medical decision making (see chart for details).  Clinical Course as of Jul 22 1738  Sat Jul 21, 2016  1316 Pt reports lines have resolved, blurriness has resolved.  States she does have one spot that looks like a "typical floater"  [EW]  1435 Ophthalmology paged several times, including once by me.    [EW]  1501 Left voicemail for ophthalmology - doctor on call line.   [EW]  4920 I left a message with the on call ophthalmologist for the [facility] system.   [EW]  1516 I left a message with the on call physician for patient's optometrist office.  (Dr Clent Demark)  [EW]      Clinical Course User Index [EW] Clayton Bibles, PA-C    Afebrile, nontoxic patient with abnormal dark vertical lines in left eye visual field and blurry vision that completely resolved after a few hours.  Exam including bedside ultrasound in ED is unremarkable.  Eye pressure high side of normal but doubt this is related to glaucoma.  Pt will need further evaluation of retina.  Dr Billy Fischer was able to speak with Dr Valetta Close, on call ophthalmologist, and follow up scheduled for tomorrow - discharge and follow up written up by Dr Billy Fischer.  Pt and significant other informed.   D/C home with ophthalmology follow up.  Discussed result, findings, treatment, and follow up  with patient.  Pt given return precautions.  Pt verbalizes understanding and agrees with plan.       Final Clinical Impressions(s) / ED Diagnoses   Final diagnoses:  Visual changes    New Prescriptions Discharge Medication List as of 07/21/2016  3:26 PM       Clayton Bibles, PA-C 07/21/16 1745    Gareth Morgan, MD 07/22/16 1007    Gareth Morgan, MD 08/17/16 1238

## 2016-07-21 NOTE — ED Triage Notes (Signed)
Pt states she noticed "black strings hanging down" in her left eye when she woke up at 10AM today. Pt states she still sees the black strings in her left field of vision. Pt states they do not appear to be floaters. Pt states she now has blurred vision in left eye.

## 2016-07-22 DIAGNOSIS — H43812 Vitreous degeneration, left eye: Secondary | ICD-10-CM | POA: Diagnosis not present

## 2016-07-24 DIAGNOSIS — H4312 Vitreous hemorrhage, left eye: Secondary | ICD-10-CM | POA: Diagnosis not present

## 2016-07-24 DIAGNOSIS — H43813 Vitreous degeneration, bilateral: Secondary | ICD-10-CM | POA: Diagnosis not present

## 2016-08-28 ENCOUNTER — Ambulatory Visit (INDEPENDENT_AMBULATORY_CARE_PROVIDER_SITE_OTHER): Payer: Medicare Other | Admitting: Family Medicine

## 2016-08-28 ENCOUNTER — Encounter: Payer: Self-pay | Admitting: Family Medicine

## 2016-08-28 VITALS — BP 110/80 | HR 63 | Temp 98.5°F | Wt 147.0 lb

## 2016-08-28 DIAGNOSIS — R1032 Left lower quadrant pain: Secondary | ICD-10-CM | POA: Diagnosis not present

## 2016-08-28 DIAGNOSIS — R938 Abnormal findings on diagnostic imaging of other specified body structures: Secondary | ICD-10-CM

## 2016-08-28 DIAGNOSIS — Z800 Family history of malignant neoplasm of digestive organs: Secondary | ICD-10-CM | POA: Diagnosis not present

## 2016-08-28 DIAGNOSIS — R935 Abnormal findings on diagnostic imaging of other abdominal regions, including retroperitoneum: Secondary | ICD-10-CM | POA: Diagnosis not present

## 2016-08-28 DIAGNOSIS — R9389 Abnormal findings on diagnostic imaging of other specified body structures: Secondary | ICD-10-CM

## 2016-08-28 LAB — CBC WITH DIFFERENTIAL/PLATELET
Basophils Absolute: 0.1 10*3/uL (ref 0.0–0.1)
Basophils Relative: 0.8 % (ref 0.0–3.0)
Eosinophils Absolute: 0.3 10*3/uL (ref 0.0–0.7)
Eosinophils Relative: 5 % (ref 0.0–5.0)
HCT: 38 % (ref 36.0–46.0)
Hemoglobin: 13 g/dL (ref 12.0–15.0)
Lymphocytes Relative: 21.1 % (ref 12.0–46.0)
Lymphs Abs: 1.3 10*3/uL (ref 0.7–4.0)
MCHC: 34.1 g/dL (ref 30.0–36.0)
MCV: 85.9 fl (ref 78.0–100.0)
Monocytes Absolute: 0.7 10*3/uL (ref 0.1–1.0)
Monocytes Relative: 11.4 % (ref 3.0–12.0)
Neutro Abs: 3.9 10*3/uL (ref 1.4–7.7)
Neutrophils Relative %: 61.7 % (ref 43.0–77.0)
Platelets: 228 10*3/uL (ref 150.0–400.0)
RBC: 4.42 Mil/uL (ref 3.87–5.11)
RDW: 13.6 % (ref 11.5–15.5)
WBC: 6.3 10*3/uL (ref 4.0–10.5)

## 2016-08-28 LAB — POCT URINALYSIS DIPSTICK
Bilirubin, UA: NEGATIVE
Blood, UA: NEGATIVE
Glucose, UA: NEGATIVE
Ketones, UA: NEGATIVE
Leukocytes, UA: NEGATIVE
Nitrite, UA: NEGATIVE
Protein, UA: NEGATIVE
Spec Grav, UA: <=1.005 — AB (ref 1.010–1.025)
Urobilinogen, UA: 0.2 E.U./dL
pH, UA: 6 (ref 5.0–8.0)

## 2016-08-28 NOTE — Progress Notes (Addendum)
Subjective:     Patient ID: [name], female   DOB: 17-Apr-1940, 76 y.o.   MRN: [id number]  HPI Patient seen with lower abdominal pain left side for the past week fairly constant. She describes a dull ache which is moderate severity and sometimes worse with movement. No appetite or weight changes. No known injury. No fevers or chills. No urinary symptoms. She does have some chronic constipation which is unchanged. Last bowel movement was earlier today and relatively normal. She does have some urinary urgency but no burning with urination.  She had colonoscopy 2009. We have no record. She is not aware of any history of diverticulitis. No recent nausea or vomiting.  She has breast cancer and is currently on tamoxifen.  Past Medical History:  Diagnosis Date  . Anemia   . Anxiety   . Arthritis   . Atrophic vaginitis   . Cancer [facility])    breast cancer  . Endometriosis   . Family history of breast cancer   . Family history of colon cancer   . GERD (gastroesophageal reflux disease)   . Headache(784.0)   . Heart murmur    as a child only  . History of radiation therapy 01/30/16- 03/02/16   Left Breast 50 Gy in 25 fractions.   . Hyperlipidemia   . Hypertension   . Hypothyroidism   . Kidney stones   . Osteopenia   . Osteoporosis   . Rheumatic fever    age 55  . Sleep apnea    " very mild" does not wear CPAP  . Thyroid disease    Hypothyroid  . Ulcer   . UTI (lower urinary tract infection)    Past Surgical History:  Procedure Laterality Date  . BREAST LUMPECTOMY WITH RADIOACTIVE SEED AND SENTINEL LYMPH NODE BIOPSY Left 09/09/2015   Procedure: LEFT BREAST LUMPECTOMY WITH RADIOACTIVE SEED AND SENTINEL LYMPH NODE BIOPSY;  Surgeon: Jackolyn Confer, MD;  Location: [place];  Service: General;  Laterality: Left;  . IR GENERIC HISTORICAL  12/05/2015   IR CV LINE INJECTION 12/05/2015 [name], MD [facility]-INTERV RAD  . LAPAROSCOPIC ENDOMETRIOSIS FULGURATION  1978  . PELVIC LAPAROSCOPY    .  PORTACATH PLACEMENT N/A 10/04/2015   Procedure: INSERTION PORT-A-CATH WITH ULTRASOUND;  Surgeon: Jackolyn Confer, MD;  Location: [place];  Service: General;  Laterality: N/A;  . TONSILLECTOMY    . ULNAR NERVE REPAIR  2010    reports that she has never smoked. She has never used smokeless tobacco. She reports that she does not drink alcohol or use drugs. family history includes Breast cancer in her mother and paternal grandmother; Colon cancer in her father; Diabetes in her paternal grandmother; Heart disease in her father and maternal grandmother; Hypertension in her mother; Stroke in her father. Allergies  Allergen Reactions  . Nitrofurantoin Nausea And Vomiting    GI upset  . Sulfamethoxazole-Trimethoprim Nausea And Vomiting and Other (See Comments)    GI upset     Review of Systems  Constitutional: Negative for appetite change, chills and fever.  Respiratory: Negative for shortness of breath.   Cardiovascular: Negative for chest pain.  Gastrointestinal: Positive for abdominal pain and constipation. Negative for blood in stool, diarrhea, nausea and vomiting.  Genitourinary: Negative for dysuria.       Objective:   Physical Exam  Constitutional: She appears well-developed and well-nourished.  Cardiovascular: Normal rate and regular rhythm.   Pulmonary/Chest: Effort normal and breath sounds normal. No respiratory distress. She has no wheezes.  She has no rales.  Abdominal: Soft. Bowel sounds are normal. She exhibits no distension and no mass. There is no rebound and no guarding.  Minimally tender left lower quadrant to deep palpation. No guarding or rebound.       Assessment:     Patient seen with one-week history of relatively constant left lower abdominal pain. She does have remote history of kidney stones. She has both ovaries. She has no acute abdomen on exam. No fevers. Etiology unclear.  Urine shows no blood- so doubt kidney stones.  No hx of diverticulitis.    Plan:      -Check urinalysis and CBC with differential -Consider CT abdomen/ pelvis -Follow-up immediately for any fevers, worsening pain, or other concerns  [name] MD [facility]  CT results: IMPRESSION: 1. No CT evidence for acute intra abdominal or pelvic pathology 2. Minimal central intra hepatic end mild extrahepatic biliary dilatation. Recommend correlation with laboratory values with follow-up MRCP as indicated 3. Endometrial stripe thickening up to 11 mm. Recommend correlation with pelvic ultrasound. Probable fibroids in the uterus.  Pt notified.  Will return on Tuesday for hepatic panel.  Will also set up gyn referral (regarding thickened endometrial stripe).  We had discussed possible pelvic ultrasound vs gyn referral and she prefers the latter.  Will also set up GI referral as she is past due for colonoscopy.  [name] MD [facility]

## 2016-08-28 NOTE — Patient Instructions (Signed)

## 2016-08-29 ENCOUNTER — Other Ambulatory Visit: Payer: Self-pay | Admitting: Family Medicine

## 2016-08-29 ENCOUNTER — Telehealth: Payer: Self-pay | Admitting: Family Medicine

## 2016-08-29 DIAGNOSIS — H43812 Vitreous degeneration, left eye: Secondary | ICD-10-CM | POA: Diagnosis not present

## 2016-08-29 DIAGNOSIS — R1032 Left lower quadrant pain: Secondary | ICD-10-CM

## 2016-08-29 DIAGNOSIS — R1030 Lower abdominal pain, unspecified: Secondary | ICD-10-CM

## 2016-08-29 NOTE — Telephone Encounter (Signed)
[name] pt returned your call °

## 2016-08-29 NOTE — Telephone Encounter (Signed)
Spoke with pt concerning lab and to pick up contrast

## 2016-08-30 ENCOUNTER — Other Ambulatory Visit (INDEPENDENT_AMBULATORY_CARE_PROVIDER_SITE_OTHER): Payer: Medicare Other

## 2016-08-30 DIAGNOSIS — R1030 Lower abdominal pain, unspecified: Secondary | ICD-10-CM

## 2016-08-30 DIAGNOSIS — R1032 Left lower quadrant pain: Secondary | ICD-10-CM | POA: Diagnosis not present

## 2016-08-30 LAB — BASIC METABOLIC PANEL
BUN: 25 mg/dL — ABNORMAL HIGH (ref 6–23)
CO2: 26 mEq/L (ref 19–32)
Calcium: 9.4 mg/dL (ref 8.4–10.5)
Chloride: 106 mEq/L (ref 96–112)
Creatinine, Ser: 0.87 mg/dL (ref 0.40–1.20)
GFR: 67.22 mL/min (ref >60.00)
Glucose, Bld: 110 mg/dL — ABNORMAL HIGH (ref 70–99)
Potassium: 4.4 mEq/L (ref 3.5–5.1)
Sodium: 140 mEq/L (ref 135–145)

## 2016-08-31 ENCOUNTER — Encounter: Payer: Medicare Other | Admitting: Adult Health

## 2016-09-03 ENCOUNTER — Telehealth: Payer: Self-pay | Admitting: *Deleted

## 2016-09-03 NOTE — Telephone Encounter (Signed)
[facility] [place] refill request levothyroxine (SYNTHROID, LEVOTHROID) 50 MCG tablet 90 days Last TSH 08/04/14 and last office visit 08/28/16 Okay to fill?

## 2016-09-03 NOTE — Telephone Encounter (Signed)
Refill once, but pt needs repeat TSH before further refills.

## 2016-09-04 MED ORDER — LEVOTHYROXINE SODIUM 50 MCG PO TABS: ORAL_TABLET | ORAL | 0 refills | Status: DC

## 2016-09-04 NOTE — Telephone Encounter (Signed)
Rx sent with a note to the pharmacy

## 2016-09-05 ENCOUNTER — Telehealth: Payer: Self-pay | Admitting: Family Medicine

## 2016-09-05 ENCOUNTER — Ambulatory Visit (INDEPENDENT_AMBULATORY_CARE_PROVIDER_SITE_OTHER)
Admission: RE | Admit: 2016-09-05 | Discharge: 2016-09-05 | Disposition: A | Discharge status: Home / Self Care | Payer: Medicare Other | Source: Ambulatory Visit | Attending: Family Medicine | Admitting: Family Medicine

## 2016-09-05 DIAGNOSIS — R1030 Lower abdominal pain, unspecified: Secondary | ICD-10-CM | POA: Diagnosis not present

## 2016-09-05 DIAGNOSIS — R1032 Left lower quadrant pain: Secondary | ICD-10-CM

## 2016-09-05 MED ORDER — IOPAMIDOL (ISOVUE-300) INJECTION 61%
100.0000 mL | Freq: Once | INTRAVENOUS | Status: AC | PRN
  Administered 2016-09-05: 100 mL via INTRAVENOUS

## 2016-09-05 NOTE — Telephone Encounter (Signed)
Pt would like a call back asap concerning her CT scan today.  Pt was told results were sent to dr Elease Hashimoto

## 2016-09-07 NOTE — Telephone Encounter (Signed)
Dr Elease Hashimoto spoke with patient

## 2016-09-07 NOTE — Addendum Note (Signed)
Addended by: [name] on: 09/07/2016 08:35 AM   Modules accepted: Orders

## 2016-09-11 ENCOUNTER — Encounter: Payer: Self-pay | Admitting: Family Medicine

## 2016-09-11 ENCOUNTER — Other Ambulatory Visit (INDEPENDENT_AMBULATORY_CARE_PROVIDER_SITE_OTHER): Payer: Medicare Other

## 2016-09-11 DIAGNOSIS — R935 Abnormal findings on diagnostic imaging of other abdominal regions, including retroperitoneum: Secondary | ICD-10-CM | POA: Diagnosis not present

## 2016-09-11 LAB — HEPATIC FUNCTION PANEL
ALT: 14 U/L (ref 0–35)
AST: 16 U/L (ref 0–37)
Albumin: 4.4 g/dL (ref 3.5–5.2)
Alkaline Phosphatase: 42 U/L (ref 39–117)
Bilirubin, Direct: 0.1 mg/dL (ref 0.0–0.3)
Total Bilirubin: 0.4 mg/dL (ref 0.2–1.2)
Total Protein: 6.6 g/dL (ref 6.0–8.3)

## 2016-09-12 ENCOUNTER — Encounter: Payer: Self-pay | Admitting: Obstetrics & Gynecology

## 2016-09-12 ENCOUNTER — Ambulatory Visit (INDEPENDENT_AMBULATORY_CARE_PROVIDER_SITE_OTHER): Payer: Medicare Other | Admitting: Obstetrics & Gynecology

## 2016-09-12 VITALS — BP 128/84 | Ht 60.0 in | Wt 147.0 lb

## 2016-09-12 DIAGNOSIS — Z780 Asymptomatic menopausal state: Secondary | ICD-10-CM

## 2016-09-12 DIAGNOSIS — R9389 Abnormal findings on diagnostic imaging of other specified body structures: Secondary | ICD-10-CM

## 2016-09-12 DIAGNOSIS — M8580 Other specified disorders of bone density and structure, unspecified site: Secondary | ICD-10-CM | POA: Diagnosis not present

## 2016-09-12 DIAGNOSIS — C50412 Malignant neoplasm of upper-outer quadrant of left female breast: Secondary | ICD-10-CM

## 2016-09-12 DIAGNOSIS — R1032 Left lower quadrant pain: Secondary | ICD-10-CM | POA: Diagnosis not present

## 2016-09-12 DIAGNOSIS — Z01411 Encounter for gynecological examination (general) (routine) with abnormal findings: Secondary | ICD-10-CM

## 2016-09-12 DIAGNOSIS — R938 Abnormal findings on diagnostic imaging of other specified body structures: Secondary | ICD-10-CM | POA: Diagnosis not present

## 2016-09-12 NOTE — Progress Notes (Signed)
[name] GNOIBBCW 01-31-1941 [id number]   History:    76 y.o. G0 Married.  Abstinent.  RP:  New patient presenting for annual gyn exam and referred by Dr Elease Hashimoto for LLQ pain  HPI:  Left pelvic pain persisting x 2 weeks now.  Moderate, dull, slightly worse with ambulation/movement.  No fall or other injury.  Not sexually active.  Nothing in vagina recently.  No PMB.  No abnormal vaginal d/c.  No UTI Sx.  Chronic constipation, unchanged recently.  Not better after passing a BM today.  Has had BMs qd in the past weeks.  No change in weight or appetite.  No fever  Past medical history,surgical history, family history and social history were all reviewed and documented in the EPIC chart.  Gynecologic History No LMP recorded. Patient is postmenopausal. Contraception: post menopausal status Last Pap: 2011. Results were: normal Last mammogram: 08/2015. Results were: normal  Obstetric History OB History  Gravida Para Term Preterm AB Living  0            SAB TAB Ectopic Multiple Live Births                    ROS: A ROS was performed and pertinent positives and negatives are included in the history.  GENERAL: No fevers or chills. HEENT: No change in vision, no earache, sore throat or sinus congestion. NECK: No pain or stiffness. CARDIOVASCULAR: No chest pain or pressure. No palpitations. PULMONARY: No shortness of breath, cough or wheeze. GASTROINTESTINAL: No abdominal pain, nausea, vomiting or diarrhea, melena or bright red blood per rectum. GENITOURINARY: No urinary frequency, urgency, hesitancy or dysuria. MUSCULOSKELETAL: No joint or muscle pain, no back pain, no recent trauma. DERMATOLOGIC: No rash, no itching, no lesions. ENDOCRINE: No polyuria, polydipsia, no heat or cold intolerance. No recent change in weight. HEMATOLOGICAL: No anemia or easy bruising or bleeding. NEUROLOGIC: No headache, seizures, numbness, tingling or weakness. PSYCHIATRIC: No depression, no loss of interest in  normal activity or change in sleep pattern.     Exam:   Ht 5' (1.524 m)   Wt 147 lb (66.7 kg)   BMI 28.71 kg/m   Body mass index is 28.71 kg/m.  General appearance : Well developed well nourished female. No acute distress HEENT: Eyes: no retinal hemorrhage or exudates,  Neck supple, trachea midline, no carotid bruits, no thyroidmegaly Lungs: Clear to auscultation, no rhonchi or wheezes, or rib retractions  Heart: Regular rate and rhythm, no murmurs or gallops Breast:Examined in sitting and supine position were symmetrical in appearance, no palpable masses or tenderness,  no skin retraction, no nipple inversion, no nipple discharge, no skin discoloration, no axillary or supraclavicular lymphadenopathy Abdomen: no palpable masses or tenderness, no rebound or guarding Extremities: no edema or skin discoloration or tenderness  Pelvic:  Bartholin, Urethra, Skene Glands: Within normal limits             Vagina: No gross lesions or discharge  Cervix: No gross lesions or discharge.  Pap done.  Uterus  AV, normal size, shape and consistency, non-tender and mobile  Adnexa  Without masses.  Mild tenderness Left Adnexa.  Anus and perineum  normal   CT scan Abdo-Pelvis 09/05/2016:  Reproductive: Prominent endometrial stripe up to 11 mm. Parenchymal calcifications in the uterus in addition to hypodense myometrial lesion probably fibroids. No adnexal masses.  Assessment/Plan:  76 y.o. female for annual exam   1. LLQ pain R/O Adnexal Cyst or mass.  Remote  h/o Endometriosis, but Menopausal x many years. - CA 125 - US Transvaginal Non-OB; Future  2. Thickened endometrium Per recent CT, Endometrial line at 11 mm.  No HRT.  No PMB.  R/O Endometrial pathology. - US Transvaginal Non-OB; Future  3. Encounter for gynecological examination with abnormal finding Normal Gyn exam except Lt Adnexal tenderness and Menopausal Atrophic Vaginitis.  Pap reflex done.  Scheduling Screening Mammo. - Pap IG  w/ reflex to HPV when ASC-U  4. Osteopenia Rt Femur neck T -2.1 on BD 01/2016.  Vit D and Wt bearing physical activity recommended.  5. Menopause present No HRT.  Well tolerated.   6. Left Breast Ca On Tamoxifen.  Counseling on above issues >50% x 30 minutes  Princess Bruins MD, 2:15 PM 09/12/2016

## 2016-09-13 ENCOUNTER — Telehealth: Payer: Self-pay | Admitting: *Deleted

## 2016-09-13 LAB — PAP IG W/ RFLX HPV ASCU

## 2016-09-13 LAB — CA 125: CA 125: 8 U/mL (ref <35)

## 2016-09-13 NOTE — Patient Instructions (Signed)
  1. LLQ pain R/O Adnexal Cyst or mass.  Remote h/o Endometriosis, but Menopausal x many years. - CA 125 - US Transvaginal Non-OB; Future  2. Thickened endometrium Per recent CT, Endometrial line at 11 mm.  No HRT.  No PMB.  R/O Endometrial pathology. - US Transvaginal Non-OB; Future  3. Encounter for gynecological examination with abnormal finding Normal Gyn exam except Lt Adnexal tenderness and Menopausal Atrophic Vaginitis.  Pap reflex done.  Scheduling Screening Mammo. - Pap IG w/ reflex to HPV when ASC-U  4. Osteopenia Rt Femur neck T -2.1 on BD 01/2016.  Vit D and Wt bearing physical activity recommended.  5. Menopause present No HRT.  Well tolerated.   6. Left Breast Ca On Tamoxifen.  [place], it was a pleasure to meet you!  I will see you soon with the Pelvic US.  I will let you know about your results as soon as available.

## 2016-09-13 NOTE — Telephone Encounter (Signed)
Will take care of it. Thank you, I will get a hold of pt.

## 2016-09-13 NOTE — Telephone Encounter (Addendum)
"  I have my first appointment Monday at 2:00pm with [facility] for bra and prosthesis fittings.  I need Dr. [name] to send orders as soon as possible before the appointment to receive these products.  If any questions call me [phone]."

## 2016-09-14 ENCOUNTER — Other Ambulatory Visit: Payer: Self-pay

## 2016-09-14 NOTE — Telephone Encounter (Signed)
Error wrong chart

## 2016-09-18 DIAGNOSIS — C50912 Malignant neoplasm of unspecified site of left female breast: Secondary | ICD-10-CM | POA: Diagnosis not present

## 2016-09-19 ENCOUNTER — Ambulatory Visit (INDEPENDENT_AMBULATORY_CARE_PROVIDER_SITE_OTHER): Payer: Medicare Other | Admitting: Obstetrics & Gynecology

## 2016-09-19 ENCOUNTER — Ambulatory Visit (INDEPENDENT_AMBULATORY_CARE_PROVIDER_SITE_OTHER): Payer: Medicare Other

## 2016-09-19 ENCOUNTER — Encounter: Payer: Self-pay | Admitting: Obstetrics & Gynecology

## 2016-09-19 VITALS — BP 142/76

## 2016-09-19 DIAGNOSIS — R1032 Left lower quadrant pain: Secondary | ICD-10-CM | POA: Diagnosis not present

## 2016-09-19 DIAGNOSIS — R9389 Abnormal findings on diagnostic imaging of other specified body structures: Secondary | ICD-10-CM

## 2016-09-19 DIAGNOSIS — R938 Abnormal findings on diagnostic imaging of other specified body structures: Secondary | ICD-10-CM | POA: Diagnosis not present

## 2016-09-19 NOTE — Progress Notes (Signed)
    Tagen Brethauer YYTKPTWS [birth year] [id number]        76 y.o.  G0 married  RP:  LLQ pain for Pelvic US  Past medical history,surgical history, problem list, medications, allergies, family history and social history were all reviewed and documented in the EPIC chart.  Directed ROS with pertinent positives and negatives documented in the history of present illness/assessment and plan.  Exam:  Vitals:   09/19/16 0849  BP: (!) 142/76   General appearance:  Normal  Pelvic US today:  T/V and T/A:  Anteverted Uterus with fluid filled endometrium 16 x 6 x 25 mm with fundal echogenic focus 7 mm ++ CFD feeder vessel.  Calcifications on Intramural fibroids 12 x 13 and 12 x 14 mm.  Rt Ovary with a thin-walled echo-free simple cyst 11 x 14 mm.  Lt Ovary normal.  No FF in CDS.  Assessment/Plan:  76 y.o. G0P0   1. Thickened endometrium With probable Endometrial Polyp.  Schedule HSC Myosure resection, D+C.  F/U Preop.  Pamphlet and information given.  2. LLQ pain Will see GI to r/o a GI origin of her pain.  Also recommended a reevaluation with Ortho as she has had lower back pain which could be radiating to the left.  Small simple cyst 1.4 cm on Rt Ovary very likely not causing any Sx and benign.  Will repeat pelvic US to f/u on it in 3 months.  Counseling >50% x 15 minutes on above issues.  Princess Bruins MD, 9:15 AM 09/19/2016

## 2016-09-19 NOTE — Patient Instructions (Signed)
1. Thickened endometrium With probable Endometrial Polyp.  Schedule HSC Myosure resection, D+C.  F/U Preop.  Pamphlet and information given.  2. LLQ pain Will see GI to r/o a GI origin of her pain.  Also recommended a reevaluation with Ortho as she has had lower back pain which could be radiating to the left.  Small simple cyst 1.4 cm on Rt Ovary very likely not causing any Sx and benign.  Will repeat pelvic US to f/u on it in 3 months.  Good to see you today Latreece!  I will see you back for your preop visit.

## 2016-09-19 NOTE — Progress Notes (Signed)
CLINIC:  Survivorship   REASON FOR VISIT:  Routine follow-up post-treatment for a recent history of breast cancer.  BRIEF ONCOLOGIC HISTORY:    Malignant neoplasm of upper-outer quadrant of left breast in female, estrogen receptor positive ([place])   08/19/2015 Initial Biopsy    Left core biopsy 2:30 o'clock: IDC, ER+(100%), PR+(100%), Ki-67 10%, HER-2 negative (ratio 1.23).       09/07/2015 Initial Diagnosis    Malignant neoplasm of upper-outer quadrant of left breast in female, estrogen receptor positive ([place])     09/09/2015 Surgery    Left breast lumpectomy ([place]): IDC, DCIS, grade 1, 0.9 cm, margins negative, 2 SLN negative, T1b, N0      09/09/2015 Oncotype testing    29/19% intermediate risk      09/19/2015 Genetic Testing    genetics testing through the Breast/Ovarian gene panel offered by [facility] found no deleterious mutations in ATM, BARD1, BRCA1, BRCA2, BRIP1, CDH1, CHEK2, EPCAM, FANCC, MLH1, MSH2, MSH6, NBN, PALB2, PMS2, PTEN, RAD51C, RAD51D, TP53, and XRCC2.      10/24/2015 - 12/26/2015 Adjuvant Chemotherapy    Docetaxel and Cyclophosphamide x 4 cycles      01/30/2016 - 03/02/2016 Radiation Therapy    Adjuvant radiation Isidore Moos): Left breast: 50 Gy in 25 fractions      04/2016 -  Anti-estrogen oral therapy    Tamoxifen 18m daily       INTERVAL HISTORY:  [name] to review her survivorship care plan detailing her treatment course for breast cancer, as well as monitoring long-term side effects of that treatment, education regarding health maintenance, screening, and overall wellness and health promotion.     Overall, [name] feeling quite well.  She is taking Tamoxifen daily and is tolerating it well.  She is without any questions or concerns regarding her breast cancer.  She does have some abdominal issues and LLQ pain that has been scanned and is currently being worked up by  gynecology.  The next step is for her to go to GI.      REVIEW OF SYSTEMS:  Review of Systems  Constitutional: Negative for appetite change, chills, diaphoresis, fatigue, fever and unexpected weight change.  HENT:   Negative for hearing loss and lump/mass.   Eyes: Negative for eye problems and icterus.  Respiratory: Negative for chest tightness, cough and shortness of breath.   Cardiovascular: Negative for chest pain, leg swelling and palpitations.  Gastrointestinal: Positive for abdominal pain and constipation. Negative for abdominal distention.  Endocrine: Negative for hot flashes.  Genitourinary: Negative for difficulty urinating and dyspareunia.   Musculoskeletal: Negative for arthralgias.  Skin: Negative for itching and rash.  Neurological: Negative for dizziness, extremity weakness and headaches.  Hematological: Negative for adenopathy. Does not bruise/bleed easily.  Psychiatric/Behavioral: Negative for depression. The patient is not nervous/anxious.   Breast: Denies any new nodularity, masses, tenderness, nipple changes, or nipple discharge.     ONCOLOGY TREATMENT TEAM:  1. [facility] 2. Medical Oncologist: Dr. MJana Hakim3. Radiation Oncologist: Dr. SIsidore Moos   PAST MEDICAL/SURGICAL HISTORY:  Past Medical History:  Diagnosis Date  . Anemia   . Anxiety   . Arthritis   . Atrophic vaginitis   . Cancer ([facility]    breast cancer  . Endometriosis   . Family history of breast cancer   . Family history of colon cancer   . GERD (gastroesophageal reflux disease)   .  Headache(784.0)   . Heart murmur    as a child only  . History of radiation therapy 01/30/16- 03/02/16   Left Breast 50 Gy in 25 fractions.   . Hyperlipidemia   . Hypertension   . Hypothyroidism   . Kidney stones   . Osteopenia   . Osteoporosis   . Rheumatic fever    age 89  . Sleep apnea    " very mild" does not wear CPAP  . Thyroid disease    Hypothyroid  .  Ulcer   . UTI (lower urinary tract infection)    Past Surgical History:  Procedure Laterality Date  . BREAST LUMPECTOMY WITH RADIOACTIVE SEED AND SENTINEL LYMPH NODE BIOPSY Left 09/09/2015   Procedure: LEFT BREAST LUMPECTOMY WITH RADIOACTIVE SEED AND SENTINEL LYMPH NODE BIOPSY;  Surgeon: Jackolyn Confer, MD;  Location: [place];  Service: General;  Laterality: Left;  . IR GENERIC HISTORICAL  12/05/2015   IR CV LINE INJECTION 12/05/2015 [name], MD [facility]-INTERV RAD  . LAPAROSCOPIC ENDOMETRIOSIS FULGURATION  1978  . PELVIC LAPAROSCOPY    . PORTACATH PLACEMENT N/A 10/04/2015   Procedure: INSERTION PORT-A-CATH WITH ULTRASOUND;  Surgeon: Jackolyn Confer, MD;  Location: [place];  Service: General;  Laterality: N/A;  . TONSILLECTOMY    . ULNAR NERVE REPAIR  2010     ALLERGIES:  Allergies  Allergen Reactions  . Nitrofurantoin Nausea And Vomiting    GI upset  . Sulfamethoxazole-Trimethoprim Nausea And Vomiting and Other (See Comments)    GI upset     CURRENT MEDICATIONS:  Outpatient Encounter Prescriptions as of 09/20/2016  Medication Sig  . atenolol (TENORMIN) 50 MG tablet TAKE 1 TABLET (50 MG TOTAL) BY MOUTH DAILY.  . chlorhexidine (PERIDEX) 0.12 % solution Use as directed 15 mLs in the mouth or throat 2 (two) times daily.   . hydrochlorothiazide (HYDRODIURIL) 25 MG tablet TAKE 1 TABLET (25 MG TOTAL) BY MOUTH DAILY. (Patient taking differently: TAKE HALF A TABLET BY MOUTH DAILY.)  . levothyroxine (SYNTHROID, LEVOTHROID) 50 MCG tablet TAKE 1 TABLET BY MOUTH DAILY BEFORE BREAKFAST  . Omeprazole-Sodium Bicarbonate (ZEGERID) 20-1100 MG CAPS capsule Take 1 capsule by mouth daily before breakfast.  . OVER THE COUNTER MEDICATION Take 475 mg by mouth daily. Tumeric 44m  . simvastatin (ZOCOR) 40 MG tablet TAKE 1 TABLET (40 MG TOTAL) BY MOUTH AT BEDTIME.  . tamoxifen (NOLVADEX) 10 MG tablet Take 10 mg by mouth 2 (two) times daily.  . traZODone (DESYREL) 50 MG tablet Take 1 tablet (50 mg total) by mouth  at bedtime.  .Marland Kitchenvenlafaxine XR (EFFEXOR-XR) 150 MG 24 hr capsule TAKE 1 CAPSULE (150 MG TOTAL) BY MOUTH DAILY.   No facility-administered encounter medications on file as of 09/20/2016.      ONCOLOGIC FAMILY HISTORY:  Family History  Problem Relation Age of Onset  . Breast cancer Mother        Age 144 . Hypertension Mother   . Heart disease Father   . Stroke Father   . Colon cancer Father        dx 793s . Breast cancer Paternal Grandmother        Age 76 . Diabetes Paternal Grandmother   . Heart disease Maternal Grandmother      GENETIC COUNSELING/TESTING: genetics testing through the Breast/Ovarian gene panel offered by [facility] found no deleterious mutations in ATM, BARD1, BRCA1, BRCA2, BRIP1, CDH1, CHEK2, EPCAM, FANCC, MLH1, MSH2, MSH6, NBN, PALB2, PMS2, PTEN, RAD51C, RAD51D, TP53, and XRCC2.  SOCIAL HISTORY:  KALII CHESMORE is married and lives with her husband in [place], [place].  She has one son who lives in [place].  Ms. Mikaelian is currently retired.  She denies any current or history of tobacco, alcohol, or illicit drug use.     PHYSICAL EXAMINATION:  Vital Signs:   Vitals:   09/20/16 0926  BP: (!) 145/54  Pulse: (!) 59  Resp: 17  Temp: 98.4 F (36.9 C)   Filed Weights   09/20/16 0926  Weight: 145 lb 9.6 oz (66 kg)   General: Well-nourished, well-appearing female in no acute distress.  She is accompanied in clinic by her husband today.   HEENT: Head is normocephalic.  Pupils equal and reactive to light. Conjunctivae clear without exudate.  Sclerae anicteric. Oral mucosa is pink, moist.  Oropharynx is pink without lesions or erythema.  Lymph: No cervical, supraclavicular, or infraclavicular lymphadenopathy noted on palpation.  Cardiovascular: Regular rate and rhythm.Marland Kitchen Respiratory: Clear to auscultation bilaterally. Chest expansion symmetric; breathing non-labored.  GI: Abdomen soft and round; non-tender, non-distended. Bowel sounds normoactive.    GU: Deferred.  Neuro: No focal deficits. Steady gait.  Psych: Mood and affect normal and appropriate for situation.  Extremities: No edema. Skin: Warm and dry.  LABORATORY DATA:  None for this visit.  DIAGNOSTIC IMAGING:  None for this visit.      ASSESSMENT AND PLAN:  [name] is a pleasant 76 y.o. female with Stage IA left breast invasive ductal carcinoma, ER+/PR+/HER2-, diagnosed in 08/2015, treated with lumpectomy, adjuvant chemotherapy, adjuvant radiation therapy, and anti-estrogen therapy with Tamoxifen starting in 04/2016.  She presents to the Survivorship Clinic for our initial meeting and routine follow-up post-completion of treatment for breast cancer.    1. Stage IA left breast cancer:  [name] is continuing to recover from definitive treatment for breast cancer. She will follow-up with her medical oncologist, Dr. [name] in 12/2016 with history and physical exam per surveillance protocol.  She is due for mammogram.  She will continue her anti-estrogen therapy with Tamoxifen. Thus far, she is tolerating the Tamoxifen well, with minimal side effects. She was instructed to make Dr. [name] or myself aware if she begins to experience any worsening side effects of the medication and I could see her back in clinic to help manage those side effects, as needed. Today, a comprehensive survivorship care plan and treatment summary was reviewed with the patient today detailing her breast cancer diagnosis, treatment course, potential late/long-term effects of treatment, appropriate follow-up care with recommendations for the future, and patient education resources.  A copy of this summary, along with a letter will be sent to the patient's primary care provider via mail/fax/In Basket message after today's visit.    2. Bone health:  Given [name]'s age/history of breast cancer, she is at risk for bone demineralization.  Her last DEXA scan was in October of 2017, and she tells me the  results were not good.  Her T score was -2.1 in her right femur, consistent with osteopenia.  She was encouraged to increase her consumption of foods rich in calcium, as well as increase her weight-bearing activities.  She was given education on specific activities to promote bone health.  3. Cancer screening:  Due to [name]'s history and her age, she should receive screening for skin cancers, colon cancer, and gynecologic cancers.  The information and recommendations are listed on the patient's comprehensive care plan/treatment summary and were reviewed in detail with the  patient.    4. Health maintenance and wellness promotion: [name] was encouraged to consume 5-7 servings of fruits and vegetables per day. We reviewed the "Nutrition Rainbow" handout, as well as the handout "Take Control of Your Health and Reduce Your Cancer Risk" from the [employer].  She was also encouraged to engage in moderate to vigorous exercise for 30 minutes per day most days of the week. We discussed the [employer] fitness program, which is designed for cancer survivors to help them become more physically fit after cancer treatments.  She was instructed to limit her alcohol consumption and continue to abstain from tobacco use..     5. Support services/counseling: It is not uncommon for this period of the patient's cancer care trajectory to be one of many emotions and stressors.  We discussed an opportunity for her to participate in the next session of [facility] ("Finding Your New Normal") support group series designed for patients after they have completed treatment.   [name] was encouraged to take advantage of our many other support services programs, support groups, and/or counseling in coping with her new life as a cancer survivor after completing anti-cancer treatment.  She was offered support today through active listening and expressive supportive counseling.  She was given information regarding  our available services and encouraged to contact me with any questions or for help enrolling in any of our support group/programs.    Dispo:   -Return to cancer center on 01/03/2017 with Dr. [name]  -Mammogram due, to schedule within next couple of weeks -She is welcome to return back to the Survivorship Clinic at any time; no additional follow-up needed at this time.  -Consider referral back to survivorship as a long-term survivor for continued surveillance  A total of (30) minutes of face-to-face time was spent with this patient with greater than 50% of that time in counseling and care-coordination.   Gardenia Phlegm, NP Survivorship Program [facility] [phone]   Note: PRIMARY CARE PROVIDER [name], [place] [phone]

## 2016-09-20 ENCOUNTER — Ambulatory Visit (HOSPITAL_BASED_OUTPATIENT_CLINIC_OR_DEPARTMENT_OTHER): Payer: Medicare Other | Admitting: Adult Health

## 2016-09-20 ENCOUNTER — Encounter: Payer: Self-pay | Admitting: Adult Health

## 2016-09-20 VITALS — BP 145/54 | HR 59 | Temp 98.4°F | Resp 17 | Ht 60.0 in | Wt 145.6 lb

## 2016-09-20 DIAGNOSIS — C50412 Malignant neoplasm of upper-outer quadrant of left female breast: Secondary | ICD-10-CM | POA: Diagnosis not present

## 2016-09-20 DIAGNOSIS — Z170 Estrogen receptor positive status [ER+]: Secondary | ICD-10-CM

## 2016-09-20 DIAGNOSIS — Z79811 Long term (current) use of aromatase inhibitors: Secondary | ICD-10-CM

## 2016-09-23 ENCOUNTER — Emergency Department (HOSPITAL_COMMUNITY)
Admission: EM | Admit: 2016-09-23 | Discharge: 2016-09-23 | Disposition: A | Discharge status: Home / Self Care | Payer: Medicare Other | Attending: Emergency Medicine | Admitting: Emergency Medicine

## 2016-09-23 ENCOUNTER — Encounter (HOSPITAL_COMMUNITY): Payer: Self-pay

## 2016-09-23 DIAGNOSIS — Z79899 Other long term (current) drug therapy: Secondary | ICD-10-CM | POA: Insufficient documentation

## 2016-09-23 DIAGNOSIS — Z888 Allergy status to other drugs, medicaments and biological substances status: Secondary | ICD-10-CM | POA: Insufficient documentation

## 2016-09-23 DIAGNOSIS — Z882 Allergy status to sulfonamides status: Secondary | ICD-10-CM | POA: Insufficient documentation

## 2016-09-23 DIAGNOSIS — R1032 Left lower quadrant pain: Secondary | ICD-10-CM | POA: Insufficient documentation

## 2016-09-23 DIAGNOSIS — E039 Hypothyroidism, unspecified: Secondary | ICD-10-CM | POA: Diagnosis not present

## 2016-09-23 LAB — URINALYSIS, ROUTINE W REFLEX MICROSCOPIC
Bilirubin Urine: NEGATIVE
Glucose, UA: NEGATIVE mg/dL
Ketones, ur: NEGATIVE mg/dL
Leukocytes, UA: NEGATIVE
Nitrite: NEGATIVE
Protein, ur: NEGATIVE mg/dL
Specific Gravity, Urine: 1.018 (ref 1.005–1.030)
pH: 5 (ref 5.0–8.0)

## 2016-09-23 LAB — I-STAT CHEM 8, ED
BUN: 24 mg/dL — ABNORMAL HIGH (ref 6–20)
Calcium, Ion: 1.25 mmol/L (ref 1.15–1.40)
Chloride: 105 mmol/L (ref 101–111)
Creatinine, Ser: 0.9 mg/dL (ref 0.44–1.00)
Glucose, Bld: 89 mg/dL (ref 65–99)
HCT: 40 % (ref 36.0–46.0)
Hemoglobin: 13.6 g/dL (ref 12.0–15.0)
Potassium: 3.8 mmol/L (ref 3.5–5.1)
Sodium: 142 mmol/L (ref 135–145)
TCO2: 28 mmol/L (ref 0–100)

## 2016-09-23 MED ORDER — HYDROCODONE-ACETAMINOPHEN 5-325 MG PO TABS
1.0000 | ORAL_TABLET | Freq: Once | ORAL | Status: AC
  Administered 2016-09-23: 1 via ORAL
  Filled 2016-09-23: qty 1

## 2016-09-23 MED ORDER — HYDROCODONE-ACETAMINOPHEN 5-325 MG PO TABS: 1.0000 | ORAL_TABLET | Freq: Four times a day (QID) | ORAL | 0 refills | Status: DC | PRN

## 2016-09-23 NOTE — ED Provider Notes (Addendum)
[place] DEPT Provider Note   CSN: [id number] Arrival date & time: 09/23/16  0354     History   Chief Complaint Chief Complaint  Patient presents with  . Abdominal Pain    HPI [name] is a 76 y.o. female.  HPI Complains of left-sided abdominal pain for the past 3 weeks, constant, made worse with changing positions improved with remaining still. No change in pain today Her last bowel movement was this morning, slightly loose. No blood per rectum. No urinary symptoms. No fever. She is treated herself with Excedrin, without relief. Patient has had evaluation within the past 3 weeks for this can as outpatient including CT scan of abdomen/pelvis with contrast showing slightly thickened endometrium, and minimal central intrahepatic and extrahepatic biliary dilatation otherwise normal. Had pelvic ultrasound showing anteverted uterus, right ovary with thin walled simple cyst and left ovary normal. She does have follow-up with gynecologist Dr.  Clydie Braun. No other associated symptoms  Past Medical History:  Diagnosis Date  . Anemia   . Anxiety   . Arthritis   . Atrophic vaginitis   . Cancer [facility])    breast cancer  . Endometriosis   . Family history of breast cancer   . Family history of colon cancer   . GERD (gastroesophageal reflux disease)   . Headache(784.0)   . Heart murmur    as a child only  . History of radiation therapy 01/30/16- 03/02/16   Left Breast 50 Gy in 25 fractions.   . Hyperlipidemia   . Hypertension   . Hypothyroidism   . Kidney stones   . Osteopenia   . Osteoporosis   . Rheumatic fever    age 66  . Sleep apnea    " very mild" does not wear CPAP  . Thyroid disease    Hypothyroid  . Ulcer   . UTI (lower urinary tract infection)     Patient Active Problem List   Diagnosis Date Noted  . Genetic testing 09/23/2015  . Malignant neoplasm of upper-outer quadrant of left breast in female, estrogen receptor positive ([place]) 09/07/2015  .  Family history of breast cancer   . Family history of colon cancer   . Prediabetes 08/30/2014  . Endometriosis   . Osteoporosis   . Hypertension   . UTI (lower urinary tract infection) 03/05/2011  . Dysuria 03/05/2011  . Hypothyroidism 06/06/2009  . FATIGUE 06/06/2009  . ELEVATED BLOOD PRESSURE 06/06/2009  . OSTEOPENIA 09/09/2008  . SCOLIOSIS 09/09/2008  . Hyperlipidemia 07/30/2008  . MIGRAINE HEADACHE 07/30/2008  . GERD 07/30/2008  . ENTHESOPATHY OF HIP REGION 07/30/2008  . HEADACHE 07/30/2008    Past Surgical History:  Procedure Laterality Date  . BREAST LUMPECTOMY WITH RADIOACTIVE SEED AND SENTINEL LYMPH NODE BIOPSY Left 09/09/2015   Procedure: LEFT BREAST LUMPECTOMY WITH RADIOACTIVE SEED AND SENTINEL LYMPH NODE BIOPSY;  Surgeon: Jackolyn Confer, MD;  Location: [place];  Service: General;  Laterality: Left;  . IR GENERIC HISTORICAL  12/05/2015   IR CV LINE INJECTION 12/05/2015 [name], MD [facility]-INTERV RAD  . LAPAROSCOPIC ENDOMETRIOSIS FULGURATION  1978  . PELVIC LAPAROSCOPY    . PORTACATH PLACEMENT N/A 10/04/2015   Procedure: INSERTION PORT-A-CATH WITH ULTRASOUND;  Surgeon: Jackolyn Confer, MD;  Location: [place];  Service: General;  Laterality: N/A;  . TONSILLECTOMY    . ULNAR NERVE REPAIR  2010    OB History    Gravida Para Term Preterm AB Living   0  SAB TAB Ectopic Multiple Live Births                   Home Medications    Prior to Admission medications   Medication Sig Start Date End Date Taking? Authorizing Provider  atenolol (TENORMIN) 50 MG tablet TAKE 1 TABLET (50 MG TOTAL) BY MOUTH DAILY. 02/22/16   Burchette, Alinda Sierras, MD  chlorhexidine (PERIDEX) 0.12 % solution Use as directed 15 mLs in the mouth or throat 2 (two) times daily.  08/30/15   [provider]  hydrochlorothiazide (HYDRODIURIL) 25 MG tablet TAKE 1 TABLET (25 MG TOTAL) BY MOUTH DAILY. Patient taking differently: TAKE HALF A TABLET BY MOUTH DAILY. 09/19/15   Burchette, Alinda Sierras, MD    levothyroxine (SYNTHROID, LEVOTHROID) 50 MCG tablet TAKE 1 TABLET BY MOUTH DAILY BEFORE BREAKFAST 09/04/16   Burchette, Alinda Sierras, MD  Omeprazole-Sodium Bicarbonate (ZEGERID) 20-1100 MG CAPS capsule Take 1 capsule by mouth daily before breakfast.    [provider]  OVER THE COUNTER MEDICATION Take 475 mg by mouth daily. Tumeric 475mg     [provider]  simvastatin (ZOCOR) 40 MG tablet TAKE 1 TABLET (40 MG TOTAL) BY MOUTH AT BEDTIME. 03/02/16   Burchette, Alinda Sierras, MD  tamoxifen (NOLVADEX) 10 MG tablet Take 10 mg by mouth 2 (two) times daily.    [provider]  traZODone (DESYREL) 50 MG tablet Take 1 tablet (50 mg total) by mouth at bedtime. 07/12/16   [name], [name], MD  venlafaxine XR (EFFEXOR-XR) 150 MG 24 hr capsule TAKE 1 CAPSULE (150 MG TOTAL) BY MOUTH DAILY. 04/30/16   Burchette, Alinda Sierras, MD    Family History Family History  Problem Relation Age of Onset  . Breast cancer Mother        Age 110  . Hypertension Mother   . Heart disease Father   . Stroke Father   . Colon cancer Father        dx 65s  . Breast cancer Paternal Grandmother        Age 3  . Diabetes Paternal Grandmother   . Heart disease Maternal Grandmother     Social History Social History  Substance Use Topics  . Smoking status: Never Smoker  . Smokeless tobacco: Never Used  . Alcohol use No     Allergies   Nitrofurantoin and Sulfamethoxazole-trimethoprim   Review of Systems Review of Systems  Constitutional: Negative.   HENT: Negative.   Respiratory: Negative.   Cardiovascular: Negative.   Gastrointestinal: Positive for abdominal pain.  Musculoskeletal: Negative.   Skin: Negative.   Neurological: Negative.   Psychiatric/Behavioral: Negative.   All other systems reviewed and are negative.    Physical Exam Updated Vital Signs BP (!) 151/63 (BP Location: Right Arm)   Pulse (!) 59   Temp 98 F (36.7 C) (Oral)   Resp 18   Ht 5' (1.524 m)   Wt 65.8 kg (145 lb)    SpO2 99%   BMI 28.32 kg/m   Physical Exam  Constitutional: She appears well-developed and well-nourished.  HENT:  Head: Normocephalic and atraumatic.  Eyes: Conjunctivae are normal. Pupils are equal, round, and reactive to light.  Neck: Neck supple. No tracheal deviation present. No thyromegaly present.  Cardiovascular: Normal rate and regular rhythm.   No murmur heard. Pulmonary/Chest: Effort normal and breath sounds normal.  Abdominal: Soft. Bowel sounds are normal. She exhibits no distension. There is tenderness.  Tender left lower quadrant no guarding rigidity or rebound  Musculoskeletal: Normal range of motion. She exhibits no edema or tenderness.  Neurological: She is alert. Coordination normal.  Skin: Skin is warm and dry. No rash noted.  Psychiatric: She has a normal mood and affect.  Nursing note and vitals reviewed.    ED Treatments / Results  Labs (all labs ordered are listed, but only abnormal results are displayed) Labs Reviewed  URINALYSIS, ROUTINE W REFLEX MICROSCOPIC  I-STAT CHEM 8, ED   Results for orders placed or performed during the hospital encounter of 09/23/16  Urinalysis, Routine w reflex microscopic  Result Value Ref Range   Color, Urine YELLOW YELLOW   APPearance HAZY (A) CLEAR   Specific Gravity, Urine 1.018 1.005 - 1.030   pH 5.0 5.0 - 8.0   Glucose, UA NEGATIVE NEGATIVE mg/dL   Hgb urine dipstick SMALL (A) NEGATIVE   Bilirubin Urine NEGATIVE NEGATIVE   Ketones, ur NEGATIVE NEGATIVE mg/dL   Protein, ur NEGATIVE NEGATIVE mg/dL   Nitrite NEGATIVE NEGATIVE   Leukocytes, UA NEGATIVE NEGATIVE   RBC / HPF 0-5 0 - 5 RBC/hpf   WBC, UA 0-5 0 - 5 WBC/hpf   Bacteria, UA RARE (A) NONE SEEN   Squamous Epithelial / LPF 6-30 (A) NONE SEEN   Mucous PRESENT   I-stat chem 8, ed  Result Value Ref Range   Sodium 142 135 - 145 mmol/L   Potassium 3.8 3.5 - 5.1 mmol/L   Chloride 105 101 - 111 mmol/L   BUN 24 (H) 6 - 20 mg/dL   Creatinine, Ser 0.90 0.44 -  1.00 mg/dL   Glucose, Bld 89 65 - 99 mg/dL   Calcium, Ion 1.25 1.15 - 1.40 mmol/L   TCO2 28 0 - 100 mmol/L   Hemoglobin 13.6 12.0 - 15.0 g/dL   HCT 40.0 36.0 - 46.0 %   US Transvaginal Non-ob  Result Date: 09/21/2016 T/V and T/A:  Anteverted Uterus with fluid filled endometrium 16 x 6 x 25 mm with fundal echogenic focus 7 mm ++ CFD feeder vessel.  Calcifications on Intramural fibroids 12 x 13 and 12 x 14 mm.  Rt Ovary with a thin-walled echo-free simple cyst 11 x 14 mm.  Lt Ovary normal.  No FF in CDS.  Ct Abdomen Pelvis W Contrast  Result Date: 09/05/2016 CLINICAL DATA:  Left lower quadrant tenderness with constipation, white stool history of breast cancer EXAM: CT ABDOMEN AND PELVIS WITH CONTRAST TECHNIQUE: Multidetector CT imaging of the abdomen and pelvis was performed using the standard protocol following bolus administration of intravenous contrast. CONTRAST:  15mL ISOVUE-300 IOPAMIDOL (ISOVUE-300) INJECTION 61% COMPARISON:  06/17/2004 FINDINGS: Lower chest: Lung bases demonstrate no acute consolidation or effusion. The heart is nonenlarged. Hepatobiliary: Steatosis. Minimal intra hepatic biliary dilatation. Prominence of the extrahepatic common bile duct up to 8 mm. No calcified stones. Stable 13 mm cyst posterior right hepatic lobe. Additional subcentimeter hypodense lesions scattered within the liver, too small to further characterize. Pancreas: Unremarkable. No pancreatic ductal dilatation or surrounding inflammatory changes. Spleen: Normal in size without focal abnormality. Adrenals/Urinary Tract: Adrenal glands within normal limits. No hydronephrosis. Subcentimeter cortical hypodense lesions within the kidneys. Bladder normal. Stomach/Bowel: Stomach is within normal limits. Appendix not well identified. No evidence of bowel wall thickening, distention, or inflammatory changes. Large amount of stool throughout the colon. Vascular/Lymphatic: Aortic atherosclerosis. No enlarged abdominal or  pelvic lymph nodes. Reproductive: Prominent endometrial stripe up to 11 mm. Parenchymal calcifications in the uterus in addition to hypodense myometrial lesion probably fibroids. No adnexal masses.  Other: No free air or free fluid. Musculoskeletal: Marked scoliosis and degenerative changes of the spine. IMPRESSION: 1. No CT evidence for acute intra abdominal or pelvic pathology 2. Minimal central intra hepatic end mild extrahepatic biliary dilatation. Recommend correlation with laboratory values with follow-up MRCP as indicated 3. Endometrial stripe thickening up to 11 mm. Recommend correlation with pelvic ultrasound. Probable fibroids in the uterus. Electronically Signed   By: Donavan Foil M.D.   On: 09/05/2016 16:55   EKG  EKG Interpretation None       Radiology No results found.  Procedures Procedures (including critical care time)  Medications Ordered in ED Medications  HYDROcodone-acetaminophen (NORCO/VICODIN) 5-325 MG per tablet 1 tablet (not administered)   10 AM pain improved after treatment with Norco. She is alert and in no distress.   Initial Impression / Assessment and Plan / ED Course  I have reviewed the triage vital signs and the nursing notes. No further diagnostic workup needed today's patient has had recent imaging and no change in her symptoms.  Pertinent labs & imaging results that were available during my care of the patient were reviewed by me and considered in my medical decision making (see chart for details).     Plan prescription Norco. Follow-up with Dr.Burchette as outpatient [facility] Controlled Substance reporting System queried Final Clinical Impressions(s) / ED Diagnoses  Diagnosis left lower quadrant abdominal pain  Final diagnoses:  None    New Prescriptions New Prescriptions   No medications on file     Orlie Dakin, MD 09/23/16 1007    Orlie Dakin, MD 09/23/16 1012

## 2016-09-23 NOTE — ED Triage Notes (Signed)
Pt here with c/o  Abdomina pain LLQ started 3 weeks ago.  Pt already been seen by PCP and OBGYN last couple of weeks. Pt stated she having been having increase pain with no relief with pain medication.

## 2016-09-23 NOTE — Discharge Instructions (Signed)
Take Tylenol for mild pain or the pain medicine prescribed for bad pain. Don't take Tylenol together with the pain medicine prescribed as the combination can be dangerous to your liver. Call Dr.Burchette's office tomorrow to schedule the next available follow-up appointment. The office staff that you were seen here when scheduling the appointment

## 2016-09-24 ENCOUNTER — Telehealth: Payer: Self-pay

## 2016-09-24 ENCOUNTER — Ambulatory Visit
Admission: RE | Admit: 2016-09-24 | Discharge: 2016-09-24 | Disposition: A | Discharge status: Home / Self Care | Payer: Medicare Other | Source: Ambulatory Visit | Attending: Adult Health | Admitting: Adult Health

## 2016-09-24 DIAGNOSIS — C50412 Malignant neoplasm of upper-outer quadrant of left female breast: Secondary | ICD-10-CM

## 2016-09-24 DIAGNOSIS — R928 Other abnormal and inconclusive findings on diagnostic imaging of breast: Secondary | ICD-10-CM | POA: Diagnosis not present

## 2016-09-24 DIAGNOSIS — Z170 Estrogen receptor positive status [ER+]: Secondary | ICD-10-CM

## 2016-09-24 NOTE — Telephone Encounter (Signed)
I spoke with patient about surgery dates. I scheduled her for Tuesday, June 26 7:30am at [facility].  Patient knows [facility] will call to schedule pre op and will give her all her instructions then.

## 2016-10-01 NOTE — Patient Instructions (Addendum)
Your procedure is scheduled on:  Tuesday, June 26  Enter through the Main Entrance of [facility] at: 6 am  Pick up the phone at the desk and dial [phone].  Call this number if you have problems the morning of surgery: [phone].  Remember: Do NOT eat or drink clear liquids (including water) after midnight Monday  Take these medicines the morning of surgery with a SIP OF WATER:  Atenolol, levothyroxine, zegerid, tamoxifen, effexor  Stop herbal medications and supplements at this time.  Do NOT wear jewelry (body piercing), metal hair clips/bobby pins, make-up, or nail polish. Do NOT wear lotions, powders, or perfumes.  You may wear deoderant. Do NOT shave for 48 hours prior to surgery. Do NOT bring valuables to the hospital. Bridgework may not be worn into surgery.  Have a responsible adult drive you home and stay with you for 24 hours after your procedure.  Home with husband [name] cell [phone].

## 2016-10-02 ENCOUNTER — Encounter (HOSPITAL_COMMUNITY)
Admission: RE | Admit: 2016-10-02 | Discharge: 2016-10-02 | Disposition: A | Discharge status: Home / Self Care | Payer: Medicare Other | Source: Ambulatory Visit | Attending: Obstetrics & Gynecology | Admitting: Obstetrics & Gynecology

## 2016-10-02 ENCOUNTER — Other Ambulatory Visit: Payer: Self-pay

## 2016-10-02 ENCOUNTER — Encounter (HOSPITAL_COMMUNITY): Payer: Self-pay

## 2016-10-02 DIAGNOSIS — R938 Abnormal findings on diagnostic imaging of other specified body structures: Secondary | ICD-10-CM | POA: Diagnosis not present

## 2016-10-02 DIAGNOSIS — Z01812 Encounter for preprocedural laboratory examination: Secondary | ICD-10-CM | POA: Diagnosis not present

## 2016-10-02 DIAGNOSIS — R1032 Left lower quadrant pain: Secondary | ICD-10-CM | POA: Diagnosis not present

## 2016-10-02 DIAGNOSIS — M545 Low back pain: Secondary | ICD-10-CM | POA: Insufficient documentation

## 2016-10-02 HISTORY — DX: Personal history of urinary calculi: Z87.442

## 2016-10-02 LAB — CBC
HCT: 40 % (ref 36.0–46.0)
Hemoglobin: 13.3 g/dL (ref 12.0–15.0)
MCH: 29 pg (ref 26.0–34.0)
MCHC: 33.3 g/dL (ref 30.0–36.0)
MCV: 87.1 fL (ref 78.0–100.0)
Platelets: 200 10*3/uL (ref 150–400)
RBC: 4.59 MIL/uL (ref 3.87–5.11)
RDW: 13.4 % (ref 11.5–15.5)
WBC: 4.9 10*3/uL (ref 4.0–10.5)

## 2016-10-02 LAB — TYPE AND SCREEN
ABO/RH(D): O NEG
Antibody Screen: NEGATIVE

## 2016-10-02 LAB — BASIC METABOLIC PANEL
Anion gap: 11 (ref 5–15)
BUN: 23 mg/dL — ABNORMAL HIGH (ref 6–20)
CO2: 21 mmol/L — ABNORMAL LOW (ref 22–32)
Calcium: 9.5 mg/dL (ref 8.9–10.3)
Chloride: 108 mmol/L (ref 101–111)
Creatinine, Ser: 0.92 mg/dL (ref 0.44–1.00)
GFR calc Af Amer: >60 mL/min (ref >60)
GFR calc non Af Amer: 59 mL/min — ABNORMAL LOW (ref >60)
Glucose, Bld: 138 mg/dL — ABNORMAL HIGH (ref 65–99)
Potassium: 3.4 mmol/L — ABNORMAL LOW (ref 3.5–5.1)
Sodium: 140 mmol/L (ref 135–145)

## 2016-10-02 LAB — ABO/RH: ABO/RH(D): O NEG

## 2016-10-02 NOTE — Pre-Procedure Instructions (Signed)
No BP sticks/IV/Blood draws in Left arm.  History of Breast Cancer.

## 2016-10-02 NOTE — Pre-Procedure Instructions (Signed)
Reviewed medications/medical history/EKG with Dr. Marcie Bal, Anesthesia.  No orders given.  E[place]te for surgery.

## 2016-10-04 ENCOUNTER — Encounter: Payer: Self-pay | Admitting: Obstetrics & Gynecology

## 2016-10-04 ENCOUNTER — Ambulatory Visit (INDEPENDENT_AMBULATORY_CARE_PROVIDER_SITE_OTHER): Payer: Medicare Other | Admitting: Obstetrics & Gynecology

## 2016-10-04 VITALS — BP 132/70

## 2016-10-04 DIAGNOSIS — N840 Polyp of corpus uteri: Secondary | ICD-10-CM | POA: Diagnosis not present

## 2016-10-04 DIAGNOSIS — M545 Low back pain, unspecified: Secondary | ICD-10-CM

## 2016-10-04 DIAGNOSIS — M79605 Pain in left leg: Secondary | ICD-10-CM

## 2016-10-04 NOTE — Patient Instructions (Signed)
1. Low back pain radiating to left lower extremity No evidence of Gynecologic origin of pain.  No stool impaction.  Probable Left Sciatalgia.  Refer to Orthopedic surgeon.  2. Endometrial polyp HSC Myosure resection, D+C 6/26th.    I will see you Tuesday morning on 6/26th!

## 2016-10-04 NOTE — Progress Notes (Signed)
    [name] FOYDXAJO 07-Mar-1941 [id number]        76 y.o.  G0P0   RP:  Increased Left lower back/LLQ/Left leg pain  HPI:  Worsening Left lower back pain which radiates to the left hip and left leg area.  No numbness.  Tendency for constipation, but patient is treating it, doesn't feel impacted.  Last BM yesterday.  No UTI Sx.  Scheduled for [facility] Myosure resection/D+C on 6/26th for thickened endometrium, probable IU Polyp.  Pelvic US 09/19/2016 showed a normal Left adnexa/Left Ovary.  Past medical history,surgical history, problem list, medications, allergies, family history and social history were all reviewed and documented in the EPIC chart.  Directed ROS with pertinent positives and negatives documented in the history of present illness/assessment and plan.  Exam:  Vitals:   10/04/16 1404  BP: 132/70   General appearance:  Normal  Abdo soft, non-tender, no distention  Gyn exam:  Vulva normal                     Bimanual exam:  Uterus AV, normal volume, mobile.  Cervix NT to mobilisation.  No Adnexal mass/cyst felt, NT.  Assessment/Plan:  76 y.o. G0P0  1. Low back pain radiating to left lower extremity No evidence of Gynecologic origin of pain.  No stool impaction.  Probable Left Sciatalgia.  Refer to Orthopedic surgeon.  2. Endometrial polyp HSC Myosure resection, D+C 6/26th.    Counseling on above issue >50% x 15 minutes.  Princess Bruins MD, 2:15 PM 10/04/2016

## 2016-10-05 ENCOUNTER — Telehealth: Payer: Self-pay | Admitting: *Deleted

## 2016-10-05 NOTE — Telephone Encounter (Signed)
Pt scheduled on 11/06/16 @ 10:00am with Dr.Yates, pt informed.

## 2016-10-05 NOTE — Telephone Encounter (Signed)
-----   Message from Princess Bruins, MD sent at 10/04/2016  2:15 PM EDT ----- Regarding: Refer to Orthopedic surgeon Left sciatalgia

## 2016-10-08 NOTE — Anesthesia Preprocedure Evaluation (Addendum)
Anesthesia Evaluation  Patient identified by MRN, date of birth, ID band Patient awake    Reviewed: Allergy & Precautions, NPO status , Patient's Chart, lab work & pertinent test results  Airway Mallampati: II  TM Distance: >3 FB Neck ROM: Full    Dental  (+) Dental Advisory Given   Pulmonary sleep apnea ,    breath sounds clear to auscultation       Cardiovascular hypertension, Pt. on medications and Pt. on home beta blockers  Rhythm:Regular Rate:Normal     Neuro/Psych  Headaches, Anxiety    GI/Hepatic Neg liver ROS, GERD  Medicated,  Endo/Other  Hypothyroidism   Renal/GU negative Renal ROS     Musculoskeletal  (+) Arthritis ,   Abdominal   Peds  Hematology negative hematology ROS (+)   Anesthesia Other Findings   Reproductive/Obstetrics                            Lab Results  Component Value Date   WBC 4.9 10/02/2016   HGB 13.3 10/02/2016   HCT 40.0 10/02/2016   MCV 87.1 10/02/2016   PLT 200 10/02/2016   Lab Results  Component Value Date   CREATININE 0.92 10/02/2016   BUN 23 (H) 10/02/2016   NA 140 10/02/2016   K 3.4 (L) 10/02/2016   CL 108 10/02/2016   CO2 21 (L) 10/02/2016    Anesthesia Physical Anesthesia Plan  ASA: II  Anesthesia Plan: General   Post-op Pain Management:    Induction: Intravenous  PONV Risk Score and Plan: 4 or greater and Ondansetron, Dexamethasone, Propofol, Midazolam and Treatment may vary due to age or medical condition  Airway Management Planned: LMA  Additional Equipment:   Intra-op Plan:   Post-operative Plan: Extubation in OR  Informed Consent: I have reviewed the patients History and Physical, chart, labs and discussed the procedure including the risks, benefits and alternatives for the proposed anesthesia with the patient or authorized representative who has indicated his/her understanding and acceptance.   Dental advisory  given  Plan Discussed with: CRNA  Anesthesia Plan Comments:        Anesthesia Quick Evaluation

## 2016-10-09 ENCOUNTER — Ambulatory Visit (HOSPITAL_COMMUNITY)
Admission: RE | Admit: 2016-10-09 | Discharge: 2016-10-09 | Disposition: A | Discharge status: Home / Self Care | Payer: Medicare Other | Source: Ambulatory Visit | Attending: Obstetrics & Gynecology | Admitting: Obstetrics & Gynecology

## 2016-10-09 ENCOUNTER — Ambulatory Visit (HOSPITAL_COMMUNITY): Payer: Medicare Other | Admitting: Anesthesiology

## 2016-10-09 ENCOUNTER — Encounter (HOSPITAL_COMMUNITY): Payer: Self-pay

## 2016-10-09 ENCOUNTER — Encounter (HOSPITAL_COMMUNITY)
Admission: RE | Disposition: A | Discharge status: Home / Self Care | Payer: Self-pay | Source: Ambulatory Visit | Attending: Obstetrics & Gynecology

## 2016-10-09 DIAGNOSIS — Z79899 Other long term (current) drug therapy: Secondary | ICD-10-CM | POA: Diagnosis not present

## 2016-10-09 DIAGNOSIS — I10 Essential (primary) hypertension: Secondary | ICD-10-CM | POA: Diagnosis not present

## 2016-10-09 DIAGNOSIS — M810 Age-related osteoporosis without current pathological fracture: Secondary | ICD-10-CM | POA: Diagnosis not present

## 2016-10-09 DIAGNOSIS — Z882 Allergy status to sulfonamides status: Secondary | ICD-10-CM | POA: Diagnosis not present

## 2016-10-09 DIAGNOSIS — Z800 Family history of malignant neoplasm of digestive organs: Secondary | ICD-10-CM | POA: Diagnosis not present

## 2016-10-09 DIAGNOSIS — Z923 Personal history of irradiation: Secondary | ICD-10-CM | POA: Insufficient documentation

## 2016-10-09 DIAGNOSIS — G4730 Sleep apnea, unspecified: Secondary | ICD-10-CM | POA: Diagnosis not present

## 2016-10-09 DIAGNOSIS — Z803 Family history of malignant neoplasm of breast: Secondary | ICD-10-CM | POA: Diagnosis not present

## 2016-10-09 DIAGNOSIS — M170 Bilateral primary osteoarthritis of knee: Secondary | ICD-10-CM | POA: Diagnosis not present

## 2016-10-09 DIAGNOSIS — R1032 Left lower quadrant pain: Secondary | ICD-10-CM | POA: Insufficient documentation

## 2016-10-09 DIAGNOSIS — F419 Anxiety disorder, unspecified: Secondary | ICD-10-CM | POA: Diagnosis not present

## 2016-10-09 DIAGNOSIS — Z888 Allergy status to other drugs, medicaments and biological substances status: Secondary | ICD-10-CM | POA: Insufficient documentation

## 2016-10-09 DIAGNOSIS — K219 Gastro-esophageal reflux disease without esophagitis: Secondary | ICD-10-CM | POA: Diagnosis not present

## 2016-10-09 DIAGNOSIS — M8580 Other specified disorders of bone density and structure, unspecified site: Secondary | ICD-10-CM | POA: Diagnosis not present

## 2016-10-09 DIAGNOSIS — E785 Hyperlipidemia, unspecified: Secondary | ICD-10-CM | POA: Diagnosis not present

## 2016-10-09 DIAGNOSIS — R938 Abnormal findings on diagnostic imaging of other specified body structures: Secondary | ICD-10-CM | POA: Diagnosis not present

## 2016-10-09 DIAGNOSIS — N840 Polyp of corpus uteri: Secondary | ICD-10-CM | POA: Diagnosis not present

## 2016-10-09 DIAGNOSIS — Z853 Personal history of malignant neoplasm of breast: Secondary | ICD-10-CM | POA: Diagnosis not present

## 2016-10-09 DIAGNOSIS — E039 Hypothyroidism, unspecified: Secondary | ICD-10-CM | POA: Diagnosis not present

## 2016-10-09 DIAGNOSIS — R7303 Prediabetes: Secondary | ICD-10-CM | POA: Diagnosis not present

## 2016-10-09 HISTORY — PX: DILATATION & CURETTAGE/HYSTEROSCOPY WITH MYOSURE: SHX6511

## 2016-10-09 SURGERY — DILATATION & CURETTAGE/HYSTEROSCOPY WITH MYOSURE
Anesthesia: General

## 2016-10-09 MED ORDER — CEFAZOLIN SODIUM-DEXTROSE 2-4 GM/100ML-% IV SOLN
2.0000 g | INTRAVENOUS | Status: AC
  Administered 2016-10-09: 2 g via INTRAVENOUS

## 2016-10-09 MED ORDER — FENTANYL CITRATE (PF) 100 MCG/2ML IJ SOLN
INTRAMUSCULAR | Status: AC
  Filled 2016-10-09: qty 2

## 2016-10-09 MED ORDER — MIDAZOLAM HCL 2 MG/2ML IJ SOLN
INTRAMUSCULAR | Status: AC
Start: 2016-10-09 — End: 2016-10-09
  Filled 2016-10-09: qty 2

## 2016-10-09 MED ORDER — HYDROMORPHONE HCL 1 MG/ML IJ SOLN: 0.2500 mg | INTRAMUSCULAR | Status: DC | PRN

## 2016-10-09 MED ORDER — MIDAZOLAM HCL 5 MG/5ML IJ SOLN
INTRAMUSCULAR | Status: DC | PRN
  Administered 2016-10-09: 2 mg via INTRAVENOUS

## 2016-10-09 MED ORDER — LIDOCAINE HCL (CARDIAC) 20 MG/ML IV SOLN
INTRAVENOUS | Status: AC
  Filled 2016-10-09: qty 5

## 2016-10-09 MED ORDER — ONDANSETRON HCL 4 MG/2ML IJ SOLN
INTRAMUSCULAR | Status: AC
  Filled 2016-10-09: qty 2

## 2016-10-09 MED ORDER — PROPOFOL 10 MG/ML IV BOLUS
INTRAVENOUS | Status: DC | PRN
  Administered 2016-10-09: 140 mg via INTRAVENOUS

## 2016-10-09 MED ORDER — PROPOFOL 10 MG/ML IV BOLUS
INTRAVENOUS | Status: AC
  Filled 2016-10-09: qty 20

## 2016-10-09 MED ORDER — DEXAMETHASONE SODIUM PHOSPHATE 10 MG/ML IJ SOLN
INTRAMUSCULAR | Status: DC | PRN
  Administered 2016-10-09: 10 mg via INTRAVENOUS

## 2016-10-09 MED ORDER — LACTATED RINGERS IV SOLN: INTRAVENOUS | Status: DC

## 2016-10-09 MED ORDER — PROMETHAZINE HCL 25 MG/ML IJ SOLN: 6.2500 mg | INTRAMUSCULAR | Status: DC | PRN

## 2016-10-09 MED ORDER — KETOROLAC TROMETHAMINE 30 MG/ML IJ SOLN
INTRAMUSCULAR | Status: AC
  Filled 2016-10-09: qty 1

## 2016-10-09 MED ORDER — FENTANYL CITRATE (PF) 100 MCG/2ML IJ SOLN
INTRAMUSCULAR | Status: DC | PRN
  Administered 2016-10-09 (×2): 50 ug via INTRAVENOUS

## 2016-10-09 MED ORDER — CHLOROPROCAINE HCL 1 % IJ SOLN
INTRAMUSCULAR | Status: DC | PRN
  Administered 2016-10-09: 20 mL

## 2016-10-09 MED ORDER — LACTATED RINGERS IV SOLN
INTRAVENOUS | Status: DC
  Administered 2016-10-09: 07:00:00 via INTRAVENOUS

## 2016-10-09 MED ORDER — SCOPOLAMINE 1 MG/3DAYS TD PT72: 1.0000 | MEDICATED_PATCH | Freq: Once | TRANSDERMAL | Status: DC

## 2016-10-09 MED ORDER — LIDOCAINE HCL (CARDIAC) 20 MG/ML IV SOLN
INTRAVENOUS | Status: DC | PRN
  Administered 2016-10-09: 50 mg via INTRAVENOUS

## 2016-10-09 MED ORDER — ONDANSETRON HCL 4 MG/2ML IJ SOLN
INTRAMUSCULAR | Status: DC | PRN
  Administered 2016-10-09: 4 mg via INTRAVENOUS

## 2016-10-09 MED ORDER — CHLOROPROCAINE HCL 1 % IJ SOLN
INTRAMUSCULAR | Status: AC
  Filled 2016-10-09: qty 30

## 2016-10-09 MED ORDER — DEXAMETHASONE SODIUM PHOSPHATE 10 MG/ML IJ SOLN
INTRAMUSCULAR | Status: AC
  Filled 2016-10-09: qty 1

## 2016-10-09 SURGICAL SUPPLY — 17 items
CANISTER SUCT 3000ML PPV (MISCELLANEOUS) ×2 IMPLANT
CATH ROBINSON RED A/P 16FR (CATHETERS) ×2 IMPLANT
CLOTH BEACON ORANGE TIMEOUT ST (SAFETY) ×2 IMPLANT
CONTAINER PREFILL 10% NBF 60ML (FORM) ×4 IMPLANT
DEVICE MYOSURE LITE (MISCELLANEOUS) ×2 IMPLANT
DILATOR CANAL MILEX (MISCELLANEOUS) ×1 IMPLANT
FILTER ARTHROSCOPY CONVERTOR (FILTER) ×2 IMPLANT
GLOVE BIO SURGEON STRL SZ 6.5 (GLOVE) ×2 IMPLANT
GLOVE BIOGEL PI IND STRL 7.0 (GLOVE) ×2 IMPLANT
GLOVE BIOGEL PI INDICATOR 7.0 (GLOVE) ×2
GOWN STRL REUS W/TWL LRG LVL3 (GOWN DISPOSABLE) ×4 IMPLANT
PACK VAGINAL MINOR WOMEN LF (CUSTOM PROCEDURE TRAY) ×2 IMPLANT
PAD OB MATERNITY 4.3X12.25 (PERSONAL CARE ITEMS) ×2 IMPLANT
SEAL ROD LENS SCOPE MYOSURE (ABLATOR) ×2 IMPLANT
TOWEL OR 17X24 6PK STRL BLUE (TOWEL DISPOSABLE) ×4 IMPLANT
TUBING AQUILEX INFLOW (TUBING) ×2 IMPLANT
TUBING AQUILEX OUTFLOW (TUBING) ×2 IMPLANT

## 2016-10-09 NOTE — Op Note (Addendum)
Operative Note  10/09/2016  8:01 AM  PATIENT:  [name]  76 y.o. female  PRE-OPERATIVE DIAGNOSIS:  Thickened endometrium with probable polyp  POST-OPERATIVE DIAGNOSIS:  Thickened endometrium with probable polyp  PROCEDURE:  Procedure(s): DILATATION & CURETTAGE/HYSTEROSCOPY WITH MYOSURE RESECTION  SURGEON:  Surgeon(s): Princess Bruins, MD  ANESTHESIA:   general with Laryngeal Mask  PROCEDURE:  Under general anesthesia with laryngeal mask the patient is in lithotomy position. She is prepped with Betadine on the suprapubic, vulvar and vaginal areas. She is draped as usual. A timeout is done. The vaginal exam reveals an anteverted uterus normal volume no adnexal mass felt.  The bladder is catheterized.  The speculum is inserted in the vagina and the anterior lip of the cervix is grasped with a tenaculum.  A paracervical block is done with Nesacaine 1% a total of 20 cc at 4 and 8:00.  Very delicate dilation of the cervix with the tiny dilators at first and then the regular Hegar dilators up to #27 without difficulty.  Insertion of the hysteroscope in the intrauterine cavity and inspection.  Both ostia are well seen.  The inspection reveals a very small flatten lesion at the fundus measuring about 1 cm in diameter with mild increased vascularity. 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  2 other lesions are visible at the lower uterine segment, close to the junction of the cervix. at 4 and 8 O'clock. Those areas appear to be thickenings of the endometrium. Pictures are taken of the intrauterine cavity including both ostia and all 3 lesions. The Reach Myosure is inserted through the hysteroscope.  Those 3 lesions are resected under direct vision entirely.  Hemostasis is adequate. Pictures are taken after resection. The hysteroscope is then removed.  A systematic curettage of the intrauterine cavity is done on all surfaces with a small sharp curette. The curette is removed. All specimens are  sent to pathology. The tenaculum is removed from the cervix. Hemostasis is adequate. The speculum is also removed. The patient is brought to recovery room in good and stable status.    ESTIMATED BLOOD LOSS:  5 cc FLUID DEFICIT:  110 cc   Intake/Output Summary (Last 24 hours) at 10/09/16 0801 Last data filed at 10/09/16 0750  Gross per 24 hour  Intake              200 ml  Output               25 ml  Net              175 ml     BLOOD ADMINISTERED:none   LOCAL MEDICATIONS USED:  Nesacaine 1% 20 cc for Paracervical block  SPECIMEN:  Source of Specimen:  Endometrial lesion resection material and endometrial curettings  DISPOSITION OF SPECIMEN:  PATHOLOGY  COUNTS:  YES  PLAN OF CARE: Transfer to PACU  Marie-Lyne LavoieMD8:01 AM

## 2016-10-09 NOTE — Discharge Instructions (Addendum)
DISCHARGE INSTRUCTIONS: D&C / D&E °The following instructions have been prepared to help you care for yourself upon your return home. °  °Personal hygiene: °• Use sanitary pads for vaginal drainage, not tampons. °• Shower the day after your procedure. °• NO tub baths, pools or Jacuzzis for 2-3 weeks. °• Wipe front to back after using the bathroom. ° °Activity and limitations: °• Do NOT drive or operate any equipment for 24 hours. The effects of anesthesia are still present and drowsiness may result. °• Do NOT rest in bed all day. °• Walking is encouraged. °• Walk up and down stairs slowly. °• You may resume your normal activity in one to two days or as indicated by your physician. ° °Sexual activity: NO intercourse for at least 2 weeks after the procedure, or as indicated by your physician. ° °Diet: Eat a light meal as desired this evening. You may resume your usual diet tomorrow. ° °Return to work: You may resume your work activities in one to two days or as indicated by your doctor. ° °What to expect after your surgery: Expect to have vaginal bleeding/discharge for 2-3 days and spotting for up to 10 days. It is not unusual to have soreness for up to 1-2 weeks. You may have a slight burning sensation when you urinate for the first day. Mild cramps may continue for a couple of days. You may have a regular period in 2-6 weeks. ° °Call your doctor for any of the following: °• Excessive vaginal bleeding, saturating and changing one pad every hour. °• Inability to urinate 6 hours after discharge from hospital. °• Pain not relieved by pain medication. °• Fever of 100.4° F or greater. °• Unusual vaginal discharge or odor. ° ° Call for an appointment:  ° ° °Patient’s signature: ______________________ ° °Nurse’s signature ________________________ ° °Support person's signature_______________________ ° ° °Hysteroscopy, Care After °Refer to this sheet in the next few weeks. These instructions provide you with information on  caring for yourself after your procedure. Your health care provider may also give you more specific instructions. Your treatment has been planned according to current medical practices, but problems sometimes occur. Call your health care provider if you have any problems or questions after your procedure. °What can I expect after the procedure? °After your procedure, it is typical to have the following: °· You may have some cramping. This normally lasts for a couple days. °· You may have bleeding. This can vary from light spotting for a few days to menstrual-like bleeding for 3-7 days. ° °Follow these instructions at home: °· Rest for the first 1-2 days after the procedure. °· Only take over-the-counter or prescription medicines as directed by your health care provider. Do not take aspirin. It can increase the chances of bleeding. °· Take showers instead of baths for 2 weeks or as directed by your health care provider. °· Do not drive for 24 hours or as directed. °· Do not drink alcohol while taking pain medicine. °· Do not use tampons, douche, or have sexual intercourse for 2 weeks or until your health care provider says it is okay. °· Take your temperature twice a day for 4-5 days. Write it down each time. °· Follow your health care provider's advice about diet, exercise, and lifting. °· If you develop constipation, you may: °? Take a mild laxative if your health care provider approves. °? Add bran foods to your diet. °? Drink enough fluids to keep your urine clear or pale yellow. °·   Try to have someone with you or available to you for the first 24-48 hours, especially if you were given a general anesthetic. °· Follow up with your health care provider as directed. °Contact a health care provider if: °· You feel dizzy or lightheaded. °· You feel sick to your stomach (nauseous). °· You have abnormal vaginal discharge. °· You have a rash. °· You have pain that is not controlled with medicine. °Get help right away  if: °· You have bleeding that is heavier than a normal menstrual period. °· You have a fever. °· You have increasing cramps or pain, not controlled with medicine. °· You have new belly (abdominal) pain. °· You pass out. °· You have pain in the tops of your shoulders (shoulder strap areas). °· You have shortness of breath. °This information is not intended to replace advice given to you by your health care provider. Make sure you discuss any questions you have with your health care provider. °Document Released: 01/21/2013 Document Revised: 09/08/2015 Document Reviewed: 10/30/2012 °Elsevier Interactive Patient Education © 2017 [employer] ° °

## 2016-10-09 NOTE — Anesthesia Procedure Notes (Signed)
Procedure Name: LMA Insertion Date/Time: 10/09/2016 7:27 AM Performed by: Barkley Boards L Pre-anesthesia Checklist: Patient identified, Patient being monitored, Emergency Drugs available, Timeout performed and Suction available Patient Re-evaluated:Patient Re-evaluated prior to inductionOxygen Delivery Method: Circle System Utilized Preoxygenation: Pre-oxygenation with 100% oxygen Intubation Type: IV induction Ventilation: Mask ventilation without difficulty LMA: LMA inserted LMA Size: 4.0 Number of attempts: 1 Placement Confirmation: positive ETCO2 and breath sounds checked- equal and bilateral Dental Injury: Teeth and Oropharynx as per pre-operative assessment

## 2016-10-09 NOTE — Transfer of Care (Signed)
Immediate Anesthesia Transfer of Care Note  Patient: [name]  Procedure(s) Performed: Procedure(s) with comments: [place] (N/A) - requesting 7:30am in our block  requests one hour  Patient Location: PACU  Anesthesia Type:General  Level of Consciousness: sedated  Airway & Oxygen Therapy: Patient Spontanous Breathing and Patient connected to nasal cannula oxygen  Post-op Assessment: Report given to RN and Post -op Vital signs reviewed and stable  Post vital signs: stable  Last Vitals:  Vitals:   10/09/16 0616  BP: 132/67  Pulse: 73  Resp: 16  Temp: 36.8 C    Last Pain:  Vitals:   10/09/16 0616  TempSrc: Oral  PainSc: 6       Patients Stated Pain Goal: 3 (81/38/87 1959)  Complications: No apparent anesthesia complications

## 2016-10-09 NOTE — H&P (Signed)
[name] is an 76 y.o. female. G0  RP:  Probable IU Polyp with Ca++ for HSC Resection, D+C  Pertinent Gynecological History: Bleeding: No PMB Contraception: post menopausal status Blood transfusions: none Sexually transmitted diseases: no past history Last mammogram: normal Last pap: normal  OB History: G0   Menstrual History:  No LMP recorded. Patient is postmenopausal.    Past Medical History:  Diagnosis Date  . Anemia   . Anxiety   . Arthritis    knees  . Atrophic vaginitis   . Cancer [facility])    breast cancer - left  . Endometriosis   . Family history of breast cancer   . Family history of colon cancer   . GERD (gastroesophageal reflux disease)   . Headache(784.0)   . Heart murmur    as a child only  . History of kidney stones    passed stones, no surgery required  . History of radiation therapy 01/30/16- 03/02/16   Left Breast 50 Gy in 25 fractions.   . Hyperlipidemia   . Hypertension   . Hypothyroidism   . Osteopenia   . Osteoporosis   . Rheumatic fever    age 80  . Sleep apnea    " very mild" does not wear CPAP  . Thyroid disease    Hypothyroid  . Ulcer   . UTI (lower urinary tract infection)     Past Surgical History:  Procedure Laterality Date  . APPENDECTOMY    . BREAST LUMPECTOMY Left    left may 2017  . BREAST LUMPECTOMY WITH RADIOACTIVE SEED AND SENTINEL LYMPH NODE BIOPSY Left 09/09/2015   Procedure: LEFT BREAST LUMPECTOMY WITH RADIOACTIVE SEED AND SENTINEL LYMPH NODE BIOPSY;  Surgeon: Jackolyn Confer, MD;  Location: [place];  Service: General;  Laterality: Left;  . COLONOSCOPY    . ESOPHAGOGASTRODUODENOSCOPY ENDOSCOPY    . IR GENERIC HISTORICAL  12/05/2015   IR CV LINE INJECTION 12/05/2015 [name], MD [facility]-INTERV RAD  . LAPAROSCOPIC ENDOMETRIOSIS FULGURATION  1978  . PELVIC LAPAROSCOPY    . PORTACATH PLACEMENT N/A 10/04/2015   Procedure: INSERTION PORT-A-CATH WITH ULTRASOUND;  Surgeon: Jackolyn Confer, MD;  Location: [place];   Service: General;  Laterality: N/A;  . portacath removal    . TONSILLECTOMY    . ULNAR NERVE REPAIR  2010    Family History  Problem Relation Age of Onset  . Breast cancer Mother        Age 3  . Hypertension Mother   . Heart disease Father   . Stroke Father   . Colon cancer Father        dx 49s  . Breast cancer Paternal Grandmother        Age 31  . Diabetes Paternal Grandmother   . Heart disease Maternal Grandmother     Social History:  reports that she has never smoked. She has never used smokeless tobacco. She reports that she does not drink alcohol or use drugs.  Allergies:  Allergies  Allergen Reactions  . Nitrofurantoin Nausea And Vomiting    GI upset  . Sulfamethoxazole-Trimethoprim Nausea And Vomiting and Other (See Comments)    GI upset    Prescriptions Prior to Admission  Medication Sig Dispense Refill Last Dose  . aspirin-acetaminophen-caffeine (EXCEDRIN MIGRAINE) 250-250-65 MG tablet Take 1 tablet by mouth daily as needed for headache.   10/08/2016 at Unknown time  . atenolol (TENORMIN) 50 MG tablet TAKE 1 TABLET (50 MG TOTAL) BY MOUTH DAILY. 90 tablet  2 10/09/2016 at 0500  . chlorhexidine (PERIDEX) 0.12 % solution Use as directed 15 mLs in the mouth or throat 2 (two) times daily.    10/09/2016 at 0500  . hydrochlorothiazide (HYDRODIURIL) 25 MG tablet TAKE 1 TABLET (25 MG TOTAL) BY MOUTH DAILY. (Patient taking differently: TAKE HALF-TABLET (12.5 MG) BY MOUTH DAILY.) 30 tablet 11 10/08/2016 at Unknown time  . HYDROcodone-acetaminophen (NORCO) 5-325 MG tablet Take 1 tablet by mouth every 6 (six) hours as needed. (Patient taking differently: Take 1 tablet by mouth every 6 (six) hours as needed (FOR PAIN.). ) 10 tablet 0 Past Week at Unknown time  . levothyroxine (SYNTHROID, LEVOTHROID) 50 MCG tablet TAKE 1 TABLET BY MOUTH DAILY BEFORE BREAKFAST 90 tablet 0 10/09/2016 at 0500  . Omeprazole-Sodium Bicarbonate (ZEGERID) 20-1100 MG CAPS capsule Take 1 capsule by mouth daily  before breakfast.   10/08/2016 at 0900  . simvastatin (ZOCOR) 40 MG tablet TAKE 1 TABLET (40 MG TOTAL) BY MOUTH AT BEDTIME. 90 tablet 2 10/08/2016 at 2130  . tamoxifen (NOLVADEX) 20 MG tablet Take 20 mg by mouth daily.   10/09/2016 at 0500  . traZODone (DESYREL) 50 MG tablet Take 1 tablet (50 mg total) by mouth at bedtime. (Patient taking differently: Take 50 mg by mouth at bedtime as needed for sleep. ) 30 tablet 4 10/08/2016 at wn 2200  . TURMERIC PO Take 320 mg by mouth daily.   Past Week at Unknown time  . venlafaxine XR (EFFEXOR-XR) 150 MG 24 hr capsule TAKE 1 CAPSULE (150 MG TOTAL) BY MOUTH DAILY. 90 capsule 1 10/09/2016 at 0500    REVIEW OF SYSTEMS: A ROS was performed and pertinent positives and negatives are included in the history.  GENERAL: No fevers or chills. HEENT: No change in vision, no earache, sore throat or sinus congestion. NECK: No pain or stiffness. CARDIOVASCULAR: No chest pain or pressure. No palpitations. PULMONARY: No shortness of breath, cough or wheeze. GASTROINTESTINAL: No abdominal pain, nausea, vomiting or diarrhea, melena or bright red blood per rectum. GENITOURINARY: No urinary frequency, urgency, hesitancy or dysuria. MUSCULOSKELETAL: No joint or muscle pain, no back pain, no recent trauma. DERMATOLOGIC: No rash, no itching, no lesions. ENDOCRINE: No polyuria, polydipsia, no heat or cold intolerance. No recent change in weight. HEMATOLOGICAL: No anemia or easy bruising or bleeding. NEUROLOGIC: No headache, seizures, numbness, tingling or weakness. PSYCHIATRIC: No depression, no loss of interest in normal activity or change in sleep pattern.     Blood pressure 132/67, pulse 73, temperature 98.3 F (36.8 C), temperature source Oral, resp. rate 16, SpO2 98 %.  Physical Exam:  See Office notes  Pelvic US 09/19/2016: T/V and T/A:  Anteverted Uterus with fluid filled endometrium 16 x 6 x 25 mm with fundal echogenic focus 7 mm ++ CFD feeder vessel.  Calcifications on  Intramural fibroids 12 x 13 and 12 x 14 mm.  Rt Ovary with a thin-walled echo-free simple cyst 11 x 14 mm.  Lt Ovary normal.  No FF in CDS.   Assessment/Plan:  1. Thickened endometrium With probable Endometrial Polyp.  Schedule HSC Myosure resection, D+C.  Pamphlet and information given.  Surgery and risks reviewed.  Informed consent signed.  2. LLQ pain Will see GI to r/o a GI origin of her pain.  Also recommended a reevaluation with Ortho as she has had lower back pain which could be radiating to the left, sciatalgia?  Small simple cyst 1.4 cm on Rt Ovary very likely not causing any Sx and  benign.  Will repeat pelvic US to f/u on it in 3 months.  Marie-Lyne Crystel Demarco 10/09/2016, 7:21 AM

## 2016-10-09 NOTE — Anesthesia Postprocedure Evaluation (Signed)
Anesthesia Post Note  Patient: [name]  Procedure(s) Performed: Procedure(s) (LRB): DILATATION & CURETTAGE/HYSTEROSCOPY WITH MYOSURE (N/A)     Patient location during evaluation: PACU Anesthesia Type: General Level of consciousness: awake and alert Pain management: pain level controlled Vital Signs Assessment: post-procedure vital signs reviewed and stable Respiratory status: spontaneous breathing, nonlabored ventilation, respiratory function stable and patient connected to nasal cannula oxygen Cardiovascular status: blood pressure returned to baseline and stable Postop Assessment: no signs of nausea or vomiting Anesthetic complications: no    Last Vitals:  Vitals:   10/09/16 0815 10/09/16 0830  BP: (!) 121/49 (!) 111/48  Pulse: 66 67  Resp: 11 15  Temp:      Last Pain:  Vitals:   10/09/16 0616  TempSrc: Oral  PainSc: 6    Pain Goal: Patients Stated Pain Goal: 3 (10/09/16 0616)               Tiajuana Amass

## 2016-10-10 ENCOUNTER — Encounter (HOSPITAL_COMMUNITY): Payer: Self-pay | Admitting: Obstetrics & Gynecology

## 2016-10-18 DIAGNOSIS — C50912 Malignant neoplasm of unspecified site of left female breast: Secondary | ICD-10-CM | POA: Diagnosis not present

## 2016-10-22 ENCOUNTER — Ambulatory Visit: Payer: Medicare Other | Admitting: Oncology

## 2016-10-22 ENCOUNTER — Other Ambulatory Visit: Payer: Medicare Other

## 2016-10-26 ENCOUNTER — Ambulatory Visit (INDEPENDENT_AMBULATORY_CARE_PROVIDER_SITE_OTHER): Payer: Medicare Other | Admitting: Obstetrics & Gynecology

## 2016-10-26 VITALS — BP 112/70

## 2016-10-26 DIAGNOSIS — Z09 Encounter for follow-up examination after completed treatment for conditions other than malignant neoplasm: Secondary | ICD-10-CM | POA: Diagnosis not present

## 2016-10-26 NOTE — Progress Notes (Signed)
    [name] HRCBULAG 1940-12-29 [id number]        76 y.o.  G0P0   RP:  Post op HSC/Myosure resection D+C 10/09/2016  HPI:  No abdo-pelvic pain.  No vaginal bleeding or d/c.  No fever.  Mictions/BMs normal.  Past medical history,surgical history, problem list, medications, allergies, family history and social history were all reviewed and documented in the EPIC chart.  Directed ROS with pertinent positives and negatives documented in the history of present illness/assessment and plan.  Exam:  Vitals:   10/26/16 1143  BP: 112/70   General appearance:  Normal  Abdo: Soft, NT Gyn exam:  Vulva normal.  Bimanual exam: Uterus AV, normal volume, NT.  No adnexal mass.  Patho: Endometrium, curettage/resection - ENDOMETRIOID TYPE POLYP(S). - THERE IS NO EVIDENCE OF HYPERPLASIA OR MALIGNANCY.   Assessment/Plan:  76 y.o. G0P0   1. Follow-up examination after gynecological surgery Good postop evolution. No Cx. Patho benign polyps.  Reassured.  F/U Annual/Gyn exam 08/2017.  Princess Bruins MD, 12:06 PM 10/26/2016

## 2016-10-26 NOTE — Patient Instructions (Signed)
1. Follow-up examination after gynecological surgery Good postop evolution. No Cx. Patho benign polyps.  Reassured.  F/U Annual/Gyn exam 08/2017.  Good to see you today [name]!  Happy to give you good news!

## 2016-11-06 ENCOUNTER — Ambulatory Visit (INDEPENDENT_AMBULATORY_CARE_PROVIDER_SITE_OTHER): Payer: Medicare Other | Admitting: Orthopaedic Surgery

## 2016-11-06 ENCOUNTER — Encounter (INDEPENDENT_AMBULATORY_CARE_PROVIDER_SITE_OTHER): Payer: Self-pay | Admitting: Orthopaedic Surgery

## 2016-11-06 VITALS — BP 136/65 | HR 60 | Ht 60.0 in | Wt 140.0 lb

## 2016-11-06 DIAGNOSIS — M5441 Lumbago with sciatica, right side: Secondary | ICD-10-CM | POA: Diagnosis not present

## 2016-11-06 DIAGNOSIS — G8929 Other chronic pain: Secondary | ICD-10-CM | POA: Diagnosis not present

## 2016-11-06 DIAGNOSIS — M5442 Lumbago with sciatica, left side: Secondary | ICD-10-CM | POA: Diagnosis not present

## 2016-11-06 NOTE — Progress Notes (Signed)
Office Visit Note   Patient: [name]           Date of Birth: 1941/04/09           MRN: [id number] Visit Date: 11/06/2016              Requested by: [name], MD [place], [place] [address] PCP: [name], MD   Assessment & Plan: Visit Diagnoses:  1. Chronic midline low back pain with bilateral sciatica          With claudication symptoms 2 months.  Plan: We will proceed with MRI scan to evaluate the listhesis at L4-5 she likely has some disc bulge with multifactorial spinal stenosis. History of breast cancer 2017.  Follow-Up Instructions: No Follow-up on file.   Orders:  Orders Placed This Encounter  Procedures  . MR Lumbar Spine w/o contrast   No orders of the defined types were placed in this encounter.     Procedures: No procedures performed   Clinical Data: No additional findings.   Subjective: Chief Complaint  Patient presents with  . Lower Back - Pain  . Left Hip - Pain    HPI 76 year old female seen with 2 month history of left lower quadrant pain back pain and lateral hip pain. She's had bone densities test which showed some decreased bone density. CT scan of the abdomen showed significant scoliosis also L4-5 anterolisthesis. Patient has history of left breast cancer had negative lymph nodes. I do not see a recent bone scan and patient is unsure she's had one or not. Patient has excellent long-term memory short-term memory is not so good. Patient is here with her husband. She describes increased pain in her back and her left hip when she ambulates. She states she hurts all the time. She stops since that seems to help. She states pain never goes away. Sometimes at night she can turn on her side below her knees up her chest this may help. She denies fever ,chills.  Review of Systems left breast cancer excision with radioactive seed and Sentinel lymph node biopsy left breast 09/09/2015. Port cath for postop  chemotherapy. D&C with mild closure 10/09/2016 with persistent back and left lower abdominal pain. Positive history of fibroids of uterus. Positive for hypothyroidism, hyperlipidemia, migraines, hypertension. Otherwise negative as it pertains to her history of present illness.   Objective: Vital Signs: BP 136/65   Pulse 60   Ht 5' (1.524 m)   Wt 140 lb (63.5 kg)   BMI 27.34 kg/m   Physical Exam  Constitutional: She is oriented to person, place, and time. She appears well-developed.  HENT:  Head: Normocephalic.  Right Ear: External ear normal.  Left Ear: External ear normal.  Eyes: Pupils are equal, round, and reactive to light.  Neck: No tracheal deviation present. No thyromegaly present.  Cardiovascular: Normal rate.   Pulmonary/Chest: Effort normal.  Abdominal: Soft.  Musculoskeletal:  Patient has significant scoliosis. She had recent CT scan declined x-rays today. No pain with hip range of motion negative straight leg raising 90 minimal knee crepitus. No pitting edema no rash or expose skin mild trochanteric bursal tenderness. No groin pain with hip range of motion. No limitation of internal rotation. Right and left hips have 40 internal and external rotation without pain. The and ankle jerk 1+ and symmetrical. Distal pulses are palpable.  Neurological: She is alert and oriented to person, place, and time.  Skin: Skin is warm and dry.  Psychiatric: She has a normal mood and affect. Her behavior is normal.    Ortho Exam  Specialty Comments:  No specialty comments available.  Imaging: No results found.   PMFS History: Patient Active Problem List   Diagnosis Date Noted  . Chronic midline low back pain with bilateral sciatica 11/06/2016  . Genetic testing 09/23/2015  . Malignant neoplasm of upper-outer quadrant of left breast in female, estrogen receptor positive ([place]) 09/07/2015  . Family history of breast cancer   . Family history of colon cancer   . Prediabetes  08/30/2014  . Endometriosis   . Osteoporosis   . Hypertension   . UTI (lower urinary tract infection) 03/05/2011  . Dysuria 03/05/2011  . Hypothyroidism 06/06/2009  . FATIGUE 06/06/2009  . ELEVATED BLOOD PRESSURE 06/06/2009  . OSTEOPENIA 09/09/2008  . SCOLIOSIS 09/09/2008  . Hyperlipidemia 07/30/2008  . MIGRAINE HEADACHE 07/30/2008  . GERD 07/30/2008  . ENTHESOPATHY OF HIP REGION 07/30/2008  . HEADACHE 07/30/2008   Past Medical History:  Diagnosis Date  . Anemia   . Anxiety   . Arthritis    knees  . Atrophic vaginitis   . Cancer [facility])    breast cancer - left  . Endometriosis   . Family history of breast cancer   . Family history of colon cancer   . GERD (gastroesophageal reflux disease)   . Headache(784.0)   . Heart murmur    as a child only  . History of kidney stones    passed stones, no surgery required  . History of radiation therapy 01/30/16- 03/02/16   Left Breast 50 Gy in 25 fractions.   . Hyperlipidemia   . Hypertension   . Hypothyroidism   . Osteopenia   . Osteoporosis   . Rheumatic fever    age 36  . Sleep apnea    " very mild" does not wear CPAP  . Thyroid disease    Hypothyroid  . Ulcer   . UTI (lower urinary tract infection)     Family History  Problem Relation Age of Onset  . Breast cancer Mother        Age 61  . Hypertension Mother   . Heart disease Father   . Stroke Father   . Colon cancer Father        dx 35s  . Breast cancer Paternal Grandmother        Age 100  . Diabetes Paternal Grandmother   . Heart disease Maternal Grandmother     Past Surgical History:  Procedure Laterality Date  . APPENDECTOMY    . BREAST LUMPECTOMY Left    left may 2017  . BREAST LUMPECTOMY WITH RADIOACTIVE SEED AND SENTINEL LYMPH NODE BIOPSY Left 09/09/2015   Procedure: LEFT BREAST LUMPECTOMY WITH RADIOACTIVE SEED AND SENTINEL LYMPH NODE BIOPSY;  Surgeon: Jackolyn Confer, MD;  Location: [facility];  Service: General;  Laterality: Left;  . COLONOSCOPY    .  DILATATION & CURETTAGE/HYSTEROSCOPY WITH MYOSURE N/A 10/09/2016   Procedure: DILATATION & CURETTAGE/HYSTEROSCOPY WITH MYOSURE;  Surgeon: Princess Bruins, MD;  Location: [facility];  Service: Gynecology;  Laterality: N/A;  requesting 7:30am in our block  requests one hour  . ESOPHAGOGASTRODUODENOSCOPY ENDOSCOPY    . IR GENERIC HISTORICAL  12/05/2015   IR CV LINE INJECTION 12/05/2015 [name], MD [facility]  . LAPAROSCOPIC ENDOMETRIOSIS FULGURATION  1978  . PELVIC LAPAROSCOPY    . PORTACATH PLACEMENT N/A 10/04/2015   Procedure: INSERTION PORT-A-CATH WITH ULTRASOUND;  Surgeon: Jackolyn Confer, MD;  Location: [facility];  Service: General;  Laterality: N/A;  . portacath removal    . TONSILLECTOMY    . ULNAR NERVE REPAIR  2010   Social History   Occupational History  . Not on file.   Social History Main Topics  . Smoking status: Never Smoker  . Smokeless tobacco: Never Used  . Alcohol use No  . Drug use: No  . Sexual activity: No

## 2016-11-07 ENCOUNTER — Encounter: Payer: Self-pay | Admitting: Family Medicine

## 2016-11-09 ENCOUNTER — Ambulatory Visit
Admission: RE | Admit: 2016-11-09 | Discharge: 2016-11-09 | Disposition: A | Discharge status: Home / Self Care | Payer: Medicare Other | Source: Ambulatory Visit | Attending: Orthopaedic Surgery | Admitting: Orthopaedic Surgery

## 2016-11-09 DIAGNOSIS — G8929 Other chronic pain: Secondary | ICD-10-CM

## 2016-11-09 DIAGNOSIS — M5442 Lumbago with sciatica, left side: Principal | ICD-10-CM

## 2016-11-09 DIAGNOSIS — M5126 Other intervertebral disc displacement, lumbar region: Secondary | ICD-10-CM | POA: Diagnosis not present

## 2016-11-09 DIAGNOSIS — M5441 Lumbago with sciatica, right side: Principal | ICD-10-CM

## 2016-11-09 DIAGNOSIS — M5136 Other intervertebral disc degeneration, lumbar region: Secondary | ICD-10-CM | POA: Diagnosis not present

## 2016-11-13 ENCOUNTER — Telehealth (INDEPENDENT_AMBULATORY_CARE_PROVIDER_SITE_OTHER): Payer: Self-pay | Admitting: Orthopaedic Surgery

## 2016-11-13 DIAGNOSIS — M545 Low back pain: Principal | ICD-10-CM

## 2016-11-13 DIAGNOSIS — G8929 Other chronic pain: Secondary | ICD-10-CM

## 2016-11-13 NOTE — Telephone Encounter (Signed)
Please advise 

## 2016-11-13 NOTE — Telephone Encounter (Signed)
I called. Reviewed case with Dr. Louanne Skye. Set her up for bilat foraminal at L4-5 with [place] then ROV with me. I tried to call her 6 times but phone busy. ucall thanks

## 2016-11-13 NOTE — Telephone Encounter (Signed)
Patient called needing to know her MRI results. Patient said she is in a lot of pain and do not want to wait to long. The number to contact patient is [phone]

## 2016-11-14 NOTE — Addendum Note (Signed)
Addended by: Meyer Cory on: 11/14/2016 03:56 PM   Modules accepted: Orders

## 2016-11-14 NOTE — Telephone Encounter (Signed)
I left voicemail explaining plan to patient. I entered order.

## 2016-11-15 ENCOUNTER — Telehealth (INDEPENDENT_AMBULATORY_CARE_PROVIDER_SITE_OTHER): Payer: Self-pay

## 2016-11-15 MED ORDER — TRAMADOL HCL 50 MG PO TABS: 50.0000 mg | ORAL_TABLET | Freq: Three times a day (TID) | ORAL | 0 refills | Status: DC | PRN

## 2016-11-15 NOTE — Telephone Encounter (Signed)
tramadol 50 mg 1 tab by mouth every 8 hours when necessary #50 tablets

## 2016-11-15 NOTE — Telephone Encounter (Signed)
Called to pharmacy. Patient advised.  

## 2016-11-15 NOTE — Telephone Encounter (Signed)
Can you please advise since Dr. Lorin Mercy is out of the office?  Per note in chart, he discussed MRI with Dr. Louanne Skye and patient has now been scheduled for injection with Dr. [name].

## 2016-11-15 NOTE — Addendum Note (Signed)
Addended by: Meyer Cory on: 11/15/2016 04:33 PM   Modules accepted: Orders

## 2016-11-15 NOTE — Telephone Encounter (Signed)
Scheduled pt for ESI for 11/28/16 which is Dr. Romona Curls first available. Pt is requesting something for pain until she can get injection. Uses [facility] [place].

## 2016-11-27 ENCOUNTER — Other Ambulatory Visit: Payer: Self-pay | Admitting: Family Medicine

## 2016-11-28 ENCOUNTER — Ambulatory Visit (INDEPENDENT_AMBULATORY_CARE_PROVIDER_SITE_OTHER): Payer: Medicare Other

## 2016-11-28 ENCOUNTER — Encounter (INDEPENDENT_AMBULATORY_CARE_PROVIDER_SITE_OTHER): Payer: Self-pay | Admitting: Physical Medicine and Rehabilitation

## 2016-11-28 ENCOUNTER — Ambulatory Visit (INDEPENDENT_AMBULATORY_CARE_PROVIDER_SITE_OTHER): Payer: Medicare Other | Admitting: Physical Medicine and Rehabilitation

## 2016-11-28 DIAGNOSIS — M5416 Radiculopathy, lumbar region: Secondary | ICD-10-CM | POA: Diagnosis not present

## 2016-11-28 MED ORDER — BETAMETHASONE SOD PHOS & ACET 6 (3-3) MG/ML IJ SUSP
12.0000 mg | Freq: Once | INTRAMUSCULAR | Status: AC
  Administered 2016-11-28: 12 mg

## 2016-11-28 MED ORDER — LIDOCAINE HCL (PF) 1 % IJ SOLN
2.0000 mL | Freq: Once | INTRAMUSCULAR | Status: AC
  Administered 2016-11-28: 2 mL

## 2016-11-28 NOTE — Patient Instructions (Signed)

## 2016-11-28 NOTE — Progress Notes (Deleted)
Left side low back pain into hip. No injury. Worse with laying down and walking. Denies pain down leg or any numbness and tingling.

## 2016-11-28 NOTE — Progress Notes (Unsigned)
Fluoro Time: 19 sec Mgy: 11.85

## 2016-11-30 NOTE — Procedures (Signed)
[name] is a pleasant 76 year old female with left low back and hip pain. 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 Her pain is all left-sided. We are going to complete a left transforaminal injection at L4 diagnostically and therapeutically. She'll follow-up with Dr. Lorin Mercy. She has failed conservative care otherwise. She has good distal strength on exam.  Lumbosacral Transforaminal Epidural Steroid Injection - Sub-Pedicular Approach with Fluoroscopic Guidance  Patient: [name]      Date of Birth: 1940/08/10 MRN: [id number] PCP: [name], MD      Visit Date: 11/28/2016   Universal Protocol:    Date/Time: 11/28/2016  Consent Given By: the patient  Position: PRONE  Additional Comments: Vital signs were monitored before and after the procedure. Patient was prepped and draped in the usual sterile fashion. The correct patient, procedure, and site was verified.   Injection Procedure Details:  Procedure Site One Meds Administered:  Meds ordered this encounter  Medications  . lidocaine (PF) (XYLOCAINE) 1 % injection 2 mL  . betamethasone acetate-betamethasone sodium phosphate (CELESTONE) injection 12 mg    Laterality: Left  Location/Site:  L4-L5  Needle size: 22 G  Needle type: Spinal  Needle Placement: Transforaminal  Findings:  -Contrast Used: 0.5 mL iohexol 180 mg iodine/mL   -Comments: Excellent flow of contrast along the nerve and into the epidural space.  Procedure Details: After squaring off the end-plates to get a true AP view, the C-arm was positioned so that an oblique view of the foramen as noted above was visualized. The target area is just inferior to the "nose of the scotty dog" or sub pedicular. The soft tissues overlying this structure were infiltrated with 2-3 ml. of 1% Lidocaine without Epinephrine.  The spinal needle was inserted toward the  target using a "trajectory" view along the fluoroscope beam.  Under AP and lateral visualization, the needle was advanced so it did not puncture dura and was located close the 6 O'Clock position of the pedical in AP tracterory. Biplanar projections were used to confirm position. Aspiration was confirmed to be negative for CSF and/or blood. A 1-2 ml. volume of Isovue-250 was injected and flow of contrast was noted at each level. Radiographs were obtained for documentation purposes.   After attaining the desired flow of contrast documented above, a 0.5 to 1.0 ml test dose of 0.25% Marcaine was injected into each respective transforaminal space.  The patient was observed for 90 seconds post injection.  After no sensory deficits were reported, and normal lower extremity motor function was noted,   the above injectate was administered so that equal amounts of the injectate were placed at each foramen (level) into the transforaminal epidural space.   Additional Comments:  The patient tolerated the procedure well Dressing: Band-Aid    Post-procedure details: Patient was observed during the procedure. Post-procedure instructions were reviewed.  Patient left the clinic in stable condition.

## 2016-12-07 ENCOUNTER — Other Ambulatory Visit: Payer: Self-pay | Admitting: Oncology

## 2016-12-19 ENCOUNTER — Ambulatory Visit (INDEPENDENT_AMBULATORY_CARE_PROVIDER_SITE_OTHER): Payer: Medicare Other | Admitting: Orthopaedic Surgery

## 2016-12-19 ENCOUNTER — Encounter (INDEPENDENT_AMBULATORY_CARE_PROVIDER_SITE_OTHER): Payer: Self-pay | Admitting: Orthopaedic Surgery

## 2016-12-19 VITALS — Ht 60.0 in | Wt 142.0 lb

## 2016-12-19 DIAGNOSIS — M5126 Other intervertebral disc displacement, lumbar region: Secondary | ICD-10-CM | POA: Diagnosis not present

## 2016-12-19 NOTE — Progress Notes (Signed)
Office Visit Note   Patient: [name]           Date of Birth: Dec 11, 1940           MRN: [id number] Visit Date: 12/19/2016              Requested by: [name], MD [place], [place] [address] PCP: [name], MD   Assessment & Plan: Visit Diagnoses:  1. Protrusion of lumbar intervertebral disc     Plan: Good relief with epidural injection will check her back again in 3 months. We reviewed the MRI again. If she has persistent problems on return we'll obtain AP and lateral lumbar spine plain radiographs.  Follow-Up Instructions: Return in about 3 months (around 03/20/2017).   Orders:  No orders of the defined types were placed in this encounter.  No orders of the defined types were placed in this encounter.     Procedures: No procedures performed   Clinical Data: No additional findings.   Subjective: Chief Complaint  Patient presents with  . Lower Back - Pain, Follow-up    HPI patient returns post epidural injection. She has scoliosis with L4-5 right disc protrusion. She states her pain went from an 8.5 down to a 3 out of 10. She is walking and moving better. She states for the first 12 are she had 100% pain relief.  Review of Systems review of systems is updated and is unchanged from last office visit other than as mentioned in history of present illness   Objective: Vital Signs: Ht 5' (1.524 m)   Wt 142 lb (64.4 kg)   BMI 27.73 kg/m   Physical Exam  Constitutional: She is oriented to person, place, and time. She appears well-developed.  HENT:  Head: Normocephalic.  Right Ear: External ear normal.  Left Ear: External ear normal.  Eyes: Pupils are equal, round, and reactive to light.  Neck: No tracheal deviation present. No thyromegaly present.  Cardiovascular: Normal rate.   Pulmonary/Chest: Effort normal.  Abdominal: Soft.  Musculoskeletal:  Helical obliquity significant scoliosis. She has some sciatic notch  tenderness pain at the right paralumbar region superior-lateral gluteal region and both trochanters. Negative straight leg raising 90.  Neurological: She is alert and oriented to person, place, and time.  Skin: Skin is warm and dry.  Psychiatric: She has a normal mood and affect. Her behavior is normal.    Ortho Exam  Specialty Comments:  No specialty comments available.  Imaging: contrast  Study Result   CLINICAL DATA:  Breast cancer, low back and LEFT leg pain.  EXAM: MRI LUMBAR SPINE WITHOUT CONTRAST  TECHNIQUE: Multiplanar, multisequence MR imaging of the lumbar spine was performed. No intravenous contrast was administered.  COMPARISON:  CT abdomen and pelvis 09/05/2016.  FINDINGS: Segmentation:  Standard.  Alignment: 8 mm anterolisthesis L4 on L5. BILATERAL L4 pars defects are seen. There is significant scoliosis, convex RIGHT, centered at L1, over 40 degrees. Rightward translation L2 on L3 of approximately 9 mm.  Vertebrae: Endplate changes due to degenerative disc disease at T11-12, but no worrisome osseous lesion.  Conus medullaris: Extends to the L1 level and appears normal.  Paraspinal and other soft tissues: Unremarkable.  Disc levels:  L1-L2: Osseous spurring extends to the LEFT. Annular bulging no subarticular zone or foraminal zone narrowing.  L2-L3: Rightward translation L2 on L3 9 mm. Foraminal and extraforaminal disc protrusion extends to the LEFT. Posterior element hypertrophy LEFT greater than RIGHT. Diffuse disc  space narrowing. LEFT L2 and LEFT L3 nerve root impingement is possible.  L3-L4: Slight rightward translation L3 on L4. Annular bulge. No definite impingement.  L4-L5: 8 mm anterolisthesis. BILATERAL L4 pars defects. Superimposed facet arthropathy. Uncovering of the disc with central protrusion. No subarticular zone narrowing, but RIGHT-sided foraminal narrowing compresses the L4 nerve root.  L5-S1:  Unremarkable  disc space.  No definite impingement.  IMPRESSION: Severe scoliosis convex RIGHT at L1, greater than 40 degrees.  Rightward translation L2 on L3. Foraminal and extraforaminal protrusion extends to the LEFT. Correlate clinically for LEFT L2 and LEFT L3 nerve root impingement.  8 mm anterolisthesis L4-5 with BILATERAL L4 pars defects and superimposed facet arthropathy. RIGHT-sided foraminal narrowing affects the L4 nerve root.  No features suggesting osseous spread of metastatic breast cancer.   Electronically Signed   By: Staci Righter M.D.   On: 11/09/2016 19:44       PMFS History: Patient Active Problem List   Diagnosis Date Noted  . Chronic midline low back pain with bilateral sciatica 11/06/2016  . Genetic testing 09/23/2015  . Malignant neoplasm of upper-outer quadrant of left breast in female, estrogen receptor positive ([place]) 09/07/2015  . Family history of breast cancer   . Family history of colon cancer   . Prediabetes 08/30/2014  . Endometriosis   . Osteoporosis   . Hypertension   . UTI (lower urinary tract infection) 03/05/2011  . Dysuria 03/05/2011  . Hypothyroidism 06/06/2009  . FATIGUE 06/06/2009  . ELEVATED BLOOD PRESSURE 06/06/2009  . OSTEOPENIA 09/09/2008  . SCOLIOSIS 09/09/2008  . Hyperlipidemia 07/30/2008  . MIGRAINE HEADACHE 07/30/2008  . GERD 07/30/2008  . ENTHESOPATHY OF HIP REGION 07/30/2008  . HEADACHE 07/30/2008   Past Medical History:  Diagnosis Date  . Anemia   . Anxiety   . Arthritis    knees  . Atrophic vaginitis   . Cancer [facility])    breast cancer - left  . Endometriosis   . Family history of breast cancer   . Family history of colon cancer   . GERD (gastroesophageal reflux disease)   . Headache(784.0)   . Heart murmur    as a child only  . History of kidney stones    passed stones, no surgery required  . History of radiation therapy 01/30/16- 03/02/16   Left Breast 50 Gy in 25 fractions.   . Hyperlipidemia   .  Hypertension   . Hypothyroidism   . Osteopenia   . Osteoporosis   . Rheumatic fever    age 59  . Sleep apnea    " very mild" does not wear CPAP  . Thyroid disease    Hypothyroid  . Ulcer   . UTI (lower urinary tract infection)     Family History  Problem Relation Age of Onset  . Breast cancer Mother        Age 49  . Hypertension Mother   . Heart disease Father   . Stroke Father   . Colon cancer Father        dx 47s  . Breast cancer Paternal Grandmother        Age 72  . Diabetes Paternal Grandmother   . Heart disease Maternal Grandmother     Past Surgical History:  Procedure Laterality Date  . APPENDECTOMY    . BREAST LUMPECTOMY Left    left may 2017  . BREAST LUMPECTOMY WITH RADIOACTIVE SEED AND SENTINEL LYMPH NODE BIOPSY Left 09/09/2015   Procedure: LEFT BREAST LUMPECTOMY WITH  RADIOACTIVE SEED AND SENTINEL LYMPH NODE BIOPSY;  Surgeon: Jackolyn Confer, MD;  Location: [facility];  Service: General;  Laterality: Left;  . COLONOSCOPY    . DILATATION & CURETTAGE/HYSTEROSCOPY WITH MYOSURE N/A 10/09/2016   Procedure: DILATATION & CURETTAGE/HYSTEROSCOPY WITH MYOSURE;  Surgeon: Princess Bruins, MD;  Location: [facility];  Service: Gynecology;  Laterality: N/A;  requesting 7:30am in our block  requests one hour  . ESOPHAGOGASTRODUODENOSCOPY ENDOSCOPY    . IR GENERIC HISTORICAL  12/05/2015   IR CV LINE INJECTION 12/05/2015 [name], MD [facility]  . LAPAROSCOPIC ENDOMETRIOSIS FULGURATION  1978  . PELVIC LAPAROSCOPY    . PORTACATH PLACEMENT N/A 10/04/2015   Procedure: INSERTION PORT-A-CATH WITH ULTRASOUND;  Surgeon: Jackolyn Confer, MD;  Location: [facility];  Service: General;  Laterality: N/A;  . portacath removal    . TONSILLECTOMY    . ULNAR NERVE REPAIR  2010   Social History   Occupational History  . Not on file.   Social History Main Topics  . Smoking status: Never Smoker  . Smokeless tobacco: Never Used  . Alcohol use No  . Drug use: No  . Sexual activity: No

## 2017-01-02 ENCOUNTER — Other Ambulatory Visit: Payer: Self-pay

## 2017-01-02 DIAGNOSIS — C50412 Malignant neoplasm of upper-outer quadrant of left female breast: Secondary | ICD-10-CM

## 2017-01-02 DIAGNOSIS — Z170 Estrogen receptor positive status [ER+]: Secondary | ICD-10-CM

## 2017-01-03 ENCOUNTER — Telehealth: Payer: Self-pay | Admitting: Oncology

## 2017-01-03 ENCOUNTER — Other Ambulatory Visit (HOSPITAL_BASED_OUTPATIENT_CLINIC_OR_DEPARTMENT_OTHER): Payer: Medicare Other

## 2017-01-03 ENCOUNTER — Ambulatory Visit (HOSPITAL_BASED_OUTPATIENT_CLINIC_OR_DEPARTMENT_OTHER): Payer: Medicare Other | Admitting: Oncology

## 2017-01-03 VITALS — BP 121/77 | HR 85 | Temp 98.2°F | Resp 19 | Ht 60.0 in | Wt 142.9 lb

## 2017-01-03 DIAGNOSIS — Z170 Estrogen receptor positive status [ER+]: Secondary | ICD-10-CM | POA: Diagnosis not present

## 2017-01-03 DIAGNOSIS — C50412 Malignant neoplasm of upper-outer quadrant of left female breast: Secondary | ICD-10-CM

## 2017-01-03 LAB — COMPREHENSIVE METABOLIC PANEL
ALT: 17 U/L (ref 0–55)
AST: 20 U/L (ref 5–34)
Albumin: 4.2 g/dL (ref 3.5–5.0)
Alkaline Phosphatase: 60 U/L (ref 40–150)
Anion Gap: 10 mEq/L (ref 3–11)
BUN: 25.5 mg/dL (ref 7.0–26.0)
CO2: 27 mEq/L (ref 22–29)
Calcium: 10.7 mg/dL — ABNORMAL HIGH (ref 8.4–10.4)
Chloride: 103 mEq/L (ref 98–109)
Creatinine: 1.1 mg/dL (ref 0.6–1.1)
EGFR: 46 mL/min/{1.73_m2} — ABNORMAL LOW (ref >90)
Glucose: 114 mg/dl (ref 70–140)
Potassium: 4.5 mEq/L (ref 3.5–5.1)
Sodium: 140 mEq/L (ref 136–145)
Total Bilirubin: 0.47 mg/dL (ref 0.20–1.20)
Total Protein: 7.4 g/dL (ref 6.4–8.3)

## 2017-01-03 LAB — CBC WITH DIFFERENTIAL/PLATELET
BASO%: 0.9 % (ref 0.0–2.0)
Basophils Absolute: 0.1 10*3/uL (ref 0.0–0.1)
EOS%: 4.2 % (ref 0.0–7.0)
Eosinophils Absolute: 0.3 10*3/uL (ref 0.0–0.5)
HCT: 45 % (ref 34.8–46.6)
HGB: 15 g/dL (ref 11.6–15.9)
LYMPH%: 21.3 % (ref 14.0–49.7)
MCH: 29 pg (ref 25.1–34.0)
MCHC: 33.3 g/dL (ref 31.5–36.0)
MCV: 87.2 fL (ref 79.5–101.0)
MONO#: 0.7 10*3/uL (ref 0.1–0.9)
MONO%: 11.8 % (ref 0.0–14.0)
NEUT#: 3.8 10*3/uL (ref 1.5–6.5)
NEUT%: 61.8 % (ref 38.4–76.8)
Platelets: 246 10*3/uL (ref 145–400)
RBC: 5.16 10*6/uL (ref 3.70–5.45)
RDW: 13.8 % (ref 11.2–14.5)
WBC: 6.1 10*3/uL (ref 3.9–10.3)
lymph#: 1.3 10*3/uL (ref 0.9–3.3)

## 2017-01-03 NOTE — Progress Notes (Signed)
[facility]  [phone] [phone]) [phone]     ID: [name] DOB: [birth year]  MR#: [id number]  XQJ#:[id number]  Patient Care Team: [name], MD as PCP - General Semaya Vida, [name], MD as Consulting Physician (Oncology) Jackolyn Confer, MD as Consulting Physician (General Surgery) Eppie Gibson, MD as Attending Physician (Radiation Oncology) Richmond Campbell, MD as Consulting Physician (Gastroenterology) Lavonna Monarch, MD as Consulting Physician (Dermatology) Delice Bison, Charlestine Massed, NP as Nurse Practitioner (Hematology and Oncology) PCP: [name], MD OTHER MD:  CHIEF COMPLAINT: Estrogen receptor positive breast cancer  CURRENT TREATMENT: Tamoxifen   BREAST CANCER HISTORY: From the original intake note:  [name] had bilateral screening mammography with tomography scheduled routinely 08/01/2015 at the [facility]. This showed a possible mass in the left breast. Accordingly on 08/15/2015 she underwent left diagnostic mammography with tomography and ultrasonography. This found the breast density to be category be. On spot compression views and was a 0.9 cm spiculated mass in the upper outer left breast, which on ultrasound was at 2:30 o'clock 5 cm from the nipple and measured 0.8 cm. Ultrasound of the axilla was benign.  Biopsy of the left breast mass in question 08/19/2015 found ([id number]) an invasive ductal carcinoma (E-cadherin positive) which was estrogen receptor 100% positive, progesterone receptor 100% positive, both with strong staining intensity, with an MIB-1 of 10%, and no HER-2 amplification, the signals ratio being 1.23 and the number per cell 1.85.  Her subsequent history is as detailed below  INTERVAL HISTORY: [name] returns today for follow-up of her estrogen receptor positive breast cancer, which is being treated with tamoxifen. She states that she is tolerating tamoxifen without hot flashes or increased vaginal  discharge. Pt reports that she obtains it a a good price. She is accompanied by her husband today. Since her last visit here she was evaluated for left lower quadrant tenderness with a CT of the abdomen and pelvis with contrast obtained 09/05/2016. This found hepatic steatosis and a prominent endometrial stripe measuring up to 11 mm. Then for evaluation of low back and left leg pain she had an MRI of the lumbar spine without contrast 11/09/2016. This showed severe scoliosis particularly at L1, and significant degenerative disease, but no evidence of cancer. The endometrial stripe problem was evaluated by Dr. Dellis Filbert through curettage 10/09/2016. This found ([id number] [phone]) no evidence of hyperplasia or malignancy. She reports that she has arthritis to her back and is being evaluated by [facility] and was recently treated with a cortisone injection given by Dr. [name] with relief of her symptoms.    REVIEW OF SYSTEMS: [name] reports that she ambulates outside for her exercise and is considering swimming as another form of exercise. Pt states that she has had daily urinary incontinence x 3-4 months. She denies unusual headaches, visual changes, nausea, vomiting, or dizziness. There has been no unusual cough, phlegm production, or pleurisy. This been no change in bowel or bladder habits. She denies unexplained fatigue or unexplained weight loss, bleeding, rash, or fever. A detailed review of systems was otherwise negative.    PAST MEDICAL HISTORY: Past Medical History:  Diagnosis Date  . Anemia   . Anxiety   . Arthritis    knees  . Atrophic vaginitis   . Cancer [facility])    breast cancer - left  . Endometriosis   . Family history of breast cancer   . Family history of colon cancer   . GERD (gastroesophageal reflux disease)   .  Headache(784.0)   . Heart murmur    as a child only  . History of kidney stones    passed stones, no surgery required  . History of radiation therapy 01/30/16-  03/02/16   Left Breast 50 Gy in 25 fractions.   . Hyperlipidemia   . Hypertension   . Hypothyroidism   . Osteopenia   . Osteoporosis   . Rheumatic fever    age 47  . Sleep apnea    " very mild" does not wear CPAP  . Thyroid disease    Hypothyroid  . Ulcer   . UTI (lower urinary tract infection)     PAST SURGICAL HISTORY: Past Surgical History:  Procedure Laterality Date  . APPENDECTOMY    . BREAST LUMPECTOMY Left    left may 2017  . BREAST LUMPECTOMY WITH RADIOACTIVE SEED AND SENTINEL LYMPH NODE BIOPSY Left 09/09/2015   Procedure: LEFT BREAST LUMPECTOMY WITH RADIOACTIVE SEED AND SENTINEL LYMPH NODE BIOPSY;  Surgeon: Jackolyn Confer, MD;  Location: [facility];  Service: General;  Laterality: Left;  . COLONOSCOPY    . DILATATION & CURETTAGE/HYSTEROSCOPY WITH MYOSURE N/A 10/09/2016   Procedure: DILATATION & CURETTAGE/HYSTEROSCOPY WITH MYOSURE;  Surgeon: Princess Bruins, MD;  Location: [facility];  Service: Gynecology;  Laterality: N/A;  requesting 7:30am in our block  requests one hour  . ESOPHAGOGASTRODUODENOSCOPY ENDOSCOPY    . IR GENERIC HISTORICAL  12/05/2015   IR CV LINE INJECTION 12/05/2015 [name], MD [facility]INTER[facility] RAD  . LAPAROSCOPIC ENDOMETRIOSIS FULGURATION  1978  . PELVIC LAPAROSCOPY    . PORTACATH PLACEMENT N/A 10/04/2015   Procedure: INSERTION PORT-A-CATH WITH ULTRASOUND;  Surgeon: Jackolyn Confer, MD;  Location: [facility];  Service: General;  Laterality: N/A;  . portacath removal    . TONSILLECTOMY    . ULNAR NERVE REPAIR  2010    FAMILY HISTORY Family History  Problem Relation Age of Onset  . Breast cancer Mother        Age 12  . Hypertension Mother   . Heart disease Father   . Stroke Father   . Colon cancer Father        dx 55s  . Breast cancer Paternal Grandmother        Age 46  . Diabetes Paternal Grandmother   . Heart disease Maternal Grandmother   The patient's father died at age 78 the patient's mother was diagnosed with breast cancer at age 68 and again at  age 41, which was the year of her death. In addition a paternal grandmother was diagnosed with breast cancer in her 61s. The patient's father had colon cancer. The patient had 2 brothers, no sisters. There is no history of ovarian cancer in the family.  GYNECOLOGIC HISTORY:  No LMP recorded. Patient is postmenopausal. Menarche age 57. The patient is GX P0. She went through menopause in her late 6s. She did not take hormone replacement. She never used oral contraceptives.  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 Her husband [name]") is retired from [employer]. They have an adopted son, [name], who is completing a second Engineer, production degree at [employer] state area he carries a diagnosis of schizophrenia, but is obviously very functional. The patient has no grandchildren. She is a Nurse, learning disability.  ADVANCED DIRECTIVES:  NOT IN PLACE   HEALTH MAINTENANCE: Social History  Substance Use Topics  . Smoking status: Never Smoker  . Smokeless tobacco: Never Used  . Alcohol use No     Colonoscopy: 2012/[facility]  PAP: 2013?   Bone density: 2012/osteopenia   Lipid panel:  Allergies  Allergen Reactions  . Nitrofurantoin Nausea And Vomiting    GI upset  . Sulfamethoxazole-Trimethoprim Nausea And Vomiting and Other (See Comments)    GI upset    Current Outpatient Prescriptions  Medication Sig Dispense Refill  . aspirin-acetaminophen-caffeine (EXCEDRIN MIGRAINE) 250-250-65 MG tablet Take 1 tablet by mouth daily as needed for headache.    Marland Kitchen atenolol (TENORMIN) 50 MG tablet TAKE 1 TABLET (50 MG TOTAL) BY MOUTH DAILY. 90 tablet 2  . atenolol (TENORMIN) 50 MG tablet TAKE 1 TABLET BY MOUTH DAILY 90 tablet 2  . chlorhexidine (PERIDEX) 0.12 % solution Use as directed 15 mLs in the mouth or throat 2 (two) times daily.     . hydrochlorothiazide (HYDRODIURIL) 25 MG tablet TAKE 1 TABLET (25 MG TOTAL) BY MOUTH DAILY. (Patient taking differently: TAKE HALF-TABLET (12.5 MG) BY MOUTH  DAILY.) 30 tablet 11  . levothyroxine (SYNTHROID, LEVOTHROID) 50 MCG tablet TAKE 1 TABLET BY MOUTH DAILY BEFORE BREAKFAST 90 tablet 0  . Omeprazole-Sodium Bicarbonate (ZEGERID) 20-1100 MG CAPS capsule Take 1 capsule by mouth daily before breakfast.    . simvastatin (ZOCOR) 40 MG tablet TAKE 1 TABLET (40 MG TOTAL) BY MOUTH AT BEDTIME. 90 tablet 2  . tamoxifen (NOLVADEX) 20 MG tablet Take 20 mg by mouth daily.    . traMADol (ULTRAM) 50 MG tablet Take 1 tablet (50 mg total) by mouth every 8 (eight) hours as needed. 50 tablet 0  . traZODone (DESYREL) 50 MG tablet TAKE 1 TABLET BY MOUTH AT BEDTIME 30 tablet 3  . TURMERIC PO Take 320 mg by mouth daily.    Marland Kitchen venlafaxine XR (EFFEXOR-XR) 150 MG 24 hr capsule TAKE 1 CAPSULE (150 MG TOTAL) BY MOUTH DAILY. 90 capsule 1   No current facility-administered medications for this visit.     OBJECTIVE: Middle-aged white woman Who appears well Vitals:   01/03/17 1115  BP: 121/77  Pulse: 85  Resp: 19  Temp: 98.2 F (36.8 C)  SpO2: 99%     Body mass index is 27.91 kg/m.    ECOG FS:1 - Symptomatic but completely ambulatory  Sclerae unicteric, pupils round and equal Oropharynx clear and moist No cervical or supraclavicular adenopathy Lungs no rales or rhonchi Heart regular rate and rhythm Abd soft, nontender, positive bowel sounds MSK no focal spinal tenderness, no upper extremity lymphedema Neuro: nonfocal, well oriented, appropriate affect Breasts the right breast is benign. The left breast is status post lumpectomy and radiation. There is no evidence of local recurrence. Both axillae are benign.   LAB RESULTS:  CMP     Component Value Date/Time   NA 140 10/02/2016 1050   NA 140 07/12/2016 1058   K 3.4 (L) 10/02/2016 1050   K 4.4 07/12/2016 1058   CL 108 10/02/2016 1050   CO2 21 (L) 10/02/2016 1050   CO2 29 07/12/2016 1058   GLUCOSE 138 (H) 10/02/2016 1050   GLUCOSE 78 07/12/2016 1058   BUN 23 (H) 10/02/2016 1050   BUN 36.0 (H)  07/12/2016 1058   CREATININE 0.92 10/02/2016 1050   CREATININE 1.2 (H) 07/12/2016 1058   CALCIUM 9.5 10/02/2016 1050   CALCIUM 9.7 07/12/2016 1058   PROT 6.6 09/11/2016 0951   PROT 7.0 07/12/2016 1058   ALBUMIN 4.4 09/11/2016 0951   ALBUMIN 4.3 07/12/2016 1058   AST 16 09/11/2016 0951   AST 18 07/12/2016 1058   ALT 14 09/11/2016 0951  ALT 15 07/12/2016 1058   ALKPHOS 42 09/11/2016 0951   ALKPHOS 56 07/12/2016 1058   BILITOT 0.4 09/11/2016 0951   BILITOT 0.41 07/12/2016 1058   GFRNONAA 59 (L) 10/02/2016 1050   GFRAA >60 10/02/2016 1050    INo results found for: SPEP, UPEP  Lab Results  Component Value Date   WBC 6.1 01/03/2017   NEUTROABS 3.8 01/03/2017   HGB 15.0 01/03/2017   HCT 45.0 01/03/2017   MCV 87.2 01/03/2017   PLT 246 01/03/2017      Chemistry      Component Value Date/Time   NA 140 10/02/2016 1050   NA 140 07/12/2016 1058   K 3.4 (L) 10/02/2016 1050   K 4.4 07/12/2016 1058   CL 108 10/02/2016 1050   CO2 21 (L) 10/02/2016 1050   CO2 29 07/12/2016 1058   BUN 23 (H) 10/02/2016 1050   BUN 36.0 (H) 07/12/2016 1058   CREATININE 0.92 10/02/2016 1050   CREATININE 1.2 (H) 07/12/2016 1058      Component Value Date/Time   CALCIUM 9.5 10/02/2016 1050   CALCIUM 9.7 07/12/2016 1058   ALKPHOS 42 09/11/2016 0951   ALKPHOS 56 07/12/2016 1058   AST 16 09/11/2016 0951   AST 18 07/12/2016 1058   ALT 14 09/11/2016 0951   ALT 15 07/12/2016 1058   BILITOT 0.4 09/11/2016 0951   BILITOT 0.41 07/12/2016 1058       No results found for: LABCA2  No components found for: LABCA125  No results for input(s): INR in the last 168 hours.  Urinalysis    Component Value Date/Time   COLORURINE YELLOW 09/23/2016 0914   APPEARANCEUR HAZY (A) 09/23/2016 0914   LABSPEC 1.018 09/23/2016 0914   PHURINE 5.0 09/23/2016 0914   GLUCOSEU NEGATIVE 09/23/2016 0914   HGBUR SMALL (A) 09/23/2016 0914   BILIRUBINUR NEGATIVE 09/23/2016 0914   BILIRUBINUR neg 08/28/2016 1028    KETONESUR NEGATIVE 09/23/2016 0914   PROTEINUR NEGATIVE 09/23/2016 0914   UROBILINOGEN 0.2 08/28/2016 1028   NITRITE NEGATIVE 09/23/2016 0914   LEUKOCYTESUR NEGATIVE 09/23/2016 0914      ELIGIBLE FOR AVAILABLE RESEARCH PROTOCOL: no  STUDIES: Mammography at the [facility] 09/24/2016 found a breast density to be category B. There was no evidence of malignancy.  ASSESSMENT: 76 y.o. [place] woman status post left breast upper outer quadrant biopsy 08/19/2015 for a clinical T1b N0, stage IA invasive ductal carcinoma, strongly estrogen and progesterone receptor positive, HER-2 not amplified, with an MIB-1 of 10%  (1) left lumpectomy and sentinel lymph node sampling 09/09/2015 showed apT1b pN0, stage IA invasive ductal carcinoma, grade 1, repeat HER-2 again negative.   (2) Oncotype DX score of 29 predicts a 19% risk of recurrence outside the breast within the next 10 years if the patient's only systemic therapy is tamoxifen for 5 years. It also predicts a further risk reduction with chemotherapy of 8%.  (3) adjuvant chemotherapy consisting of cyclophosphamide and docetaxel every 21 days 4, started 10/24/2015, completed 12/26/2015.  (4) adjuvant radiation 01/30/16 - 03/02/16 Site/dose:   Left breast: 50 Gy in 25 fractions  (5) started tamoxifen 04/16/2016  (a) bone density October 2017 shows a T score of -2.1 (osteopenia)  (6) genetics testing 09/19/2015 through the Breast/Ovarian gene panel offered by [facility] found no deleterious mutations in ATM, BARD1, BRCA1, BRCA2, BRIP1, CDH1, CHEK2, EPCAM, FANCC, MLH1, MSH2, MSH6, NBN, PALB2, PMS2, PTEN, RAD51C, RAD51D, TP53, and XRCC2.  PLAN: I spent approximately 30 minutes with [name] with most of  that time spent discussing her multiple issues, but basically she is now about a year and a half out from definitive surgery for her breast cancer with no evidence of disease recurrence. This is favorable.  It is also favorable that she is tolerating  tamoxifen with essentially no side effects. The plan is to continue that for a minimum of 5 years  We discussed the urinary incontinence issue. I advised her to avoid hourly to keep the bladder empty so she has minimal leakage. Of course this is very inconvenient but eventually she may train the bladder to respond and she keeps it empty for a few weeks. Second way to deal with this would be to see our physical therapy pelvis expert and I offered her that referral. A third way to deal with this is to see Dr. Gayla Doss in urology.  At this point [name] was to think about it but really not move in any of those directions.  We discussed her hepatic steatosis. This can be significant if if it persists. I suggested she eat a lot of vegetables and start an exercise program. Given her back and other problems likely this would be water aerobics and possibly swimming. Luckily [facility] [employer]  At this point if she sees Dr. Zella Richer in 6 months, she can see me again in one year.  She knows to call for any problems that may develop before the next visit here.   Coal Nearhood, [name], MD  01/03/17 11:27 AM Medical Oncology and Hematology [facility] [address] [place], [place] [address] Tel. [phone]    Fax. [phone]  This document serves as a record of services personally performed by Lurline Del, MD. It was created on her behalf by Steva Colder, a trained medical scribe. The creation of this record is based on the scribe's personal observations and the provider's statements to them. This document has been checked and approved by the attending provider.

## 2017-01-03 NOTE — Telephone Encounter (Signed)
Scheduled appt per 9/20 los - lab and f/u in one year - reminder letter sent in the mail.

## 2017-02-13 ENCOUNTER — Other Ambulatory Visit: Payer: Self-pay | Admitting: Family Medicine

## 2017-02-20 ENCOUNTER — Other Ambulatory Visit: Payer: Self-pay | Admitting: *Deleted

## 2017-02-20 MED ORDER — TRAZODONE HCL 50 MG PO TABS: 50.0000 mg | ORAL_TABLET | Freq: Every day | ORAL | 3 refills | Status: DC

## 2017-03-19 ENCOUNTER — Other Ambulatory Visit: Payer: Self-pay | Admitting: Family Medicine

## 2017-03-20 ENCOUNTER — Other Ambulatory Visit: Payer: Self-pay | Admitting: Family Medicine

## 2017-03-20 DIAGNOSIS — E038 Other specified hypothyroidism: Secondary | ICD-10-CM

## 2017-03-20 NOTE — Telephone Encounter (Signed)
Put in order for TSH and refill for 1 month until we can get result done

## 2017-03-20 NOTE — Telephone Encounter (Signed)
Last office visit 08/28/16 and last TSH lab 08/04/14.  Okay to refill?

## 2017-03-22 ENCOUNTER — Ambulatory Visit (INDEPENDENT_AMBULATORY_CARE_PROVIDER_SITE_OTHER): Payer: Medicare Other | Admitting: Orthopaedic Surgery

## 2017-03-22 ENCOUNTER — Ambulatory Visit (INDEPENDENT_AMBULATORY_CARE_PROVIDER_SITE_OTHER): Payer: Medicare Other

## 2017-03-22 ENCOUNTER — Encounter (INDEPENDENT_AMBULATORY_CARE_PROVIDER_SITE_OTHER): Payer: Self-pay | Admitting: Orthopaedic Surgery

## 2017-03-22 VITALS — BP 162/88 | HR 85 | Ht 60.0 in | Wt 142.9 lb

## 2017-03-22 DIAGNOSIS — M545 Low back pain: Secondary | ICD-10-CM

## 2017-03-22 DIAGNOSIS — M419 Scoliosis, unspecified: Secondary | ICD-10-CM

## 2017-03-22 DIAGNOSIS — G8929 Other chronic pain: Secondary | ICD-10-CM

## 2017-03-22 NOTE — Progress Notes (Signed)
Office Visit Note   Patient: [name]           Date of Birth: July 02, 1940           MRN: [id number] Visit Date: 03/22/2017              Requested by: [name], MD [place], [place] [address] PCP: [name], MD   Assessment & Plan: Visit Diagnoses:  1. Chronic low back pain, unspecified back pain laterality, with sciatica presence unspecified     Plan: We will set her up for second epidural she previously got good relief with this.  If she has ongoing problems she can call after the holidays and we will set up a referral to Dr. [name] office for evaluation by him or 1 of his colleagues for consideration of possible multilevel instrumented fusion for her degenerative scoliosis.  Follow-Up Instructions: No Follow-up on file.   Orders:  Orders Placed This Encounter  Procedures  . XR Lumbar Spine 2-3 Views   No orders of the defined types were placed in this encounter.     Procedures: No procedures performed   Clinical Data: No additional findings.   Subjective: Chief Complaint  Patient presents with  . Lower Back - Pain, Follow-up    HPI 76-year-old female returns with ongoing problems with her back pain.  She had an epidural injection with Dr. [name] and her pain went from an 8 down to about a 3 out of 10.  She states gradually she has had recurrence of her pain and is now it is more severe.  X-rays today demonstrates a significant curve to the right apex at about T12-L1.  Curve is 60 degrees we did not obtain thoracic films and I looked at her previous chest x-ray that she had a couple months ago.  We discussed options for referral to [facility].  She had brief previous breast cancer on the left with lumpectomy and lymph node dissection.  She and her husband states she had negative lymph lymph nodes.  She is on tamoxifen and had 100% positive progesterone receptor 100% both with strong staining intensity and M1 B-1 of 10% and no  H ER-2 amplification.  Review of Systems 14 point review of systems updated unchanged from last office , other than as mentioned above.  Previous epidural was in August 2018 with Dr. [name].   No dyspnea.   Objective: Vital Signs: BP (!) 162/88   Pulse 85   Ht 5' (1.524 m)   Wt 142 lb 14.4 oz (64.8 kg)   BMI 27.91 kg/m   Physical Exam  Constitutional: She is oriented to person, place, and time. She appears well-developed.  HENT:  Head: Normocephalic.  Right Ear: External ear normal.  Left Ear: External ear normal.  Eyes: Pupils are equal, round, and reactive to light.  Neck: No tracheal deviation present. No thyromegaly present.  Cardiovascular: Normal rate.  Pulmonary/Chest: Effort normal.  Abdominal: Soft.  Neurological: She is alert and oriented to person, place, and time.  Skin: Skin is warm and dry.  Psychiatric: She has a normal mood and affect. Her behavior is normal.    Ortho Exam positive for scoliosis lower extremity reflexes are 1+ and symmetrical.  In standing position she plum bobs off the midline with head right of sacral midline.  She ambulates with a short stride gait.  She has obliquity of the pelvis in standing position and some tenderness palpation over the lumbar  region.  Anterior tib gastrocsoleus is strong.  Specialty Comments:  No specialty comments available.  Imaging: Xr Lumbar Spine 2-3 Views  Result Date: 03/22/2017 AP lateral lumbar spine x-rays obtained.  This shows normal pelvis and hips.  Patient has significant scoliotic curve with apex on the right at T12-L1 junction measuring 60 degrees.  No acute compression fracture seen but she has some lateral listhesis at L3-4 and significant spurring anteriorly to the midportion of her curve. Impression scoliosis with degenerative changes right apex at T12-L1 curve.    PMFS History: Patient Active Problem List   Diagnosis Date Noted  . Chronic midline low back pain with bilateral sciatica 11/06/2016    . Genetic testing 09/23/2015  . Malignant neoplasm of upper-outer quadrant of left breast in female, estrogen receptor positive ([place]) 09/07/2015  . Family history of breast cancer   . Family history of colon cancer   . Prediabetes 08/30/2014  . Endometriosis   . Osteoporosis   . Hypertension   . UTI (lower urinary tract infection) 03/05/2011  . Dysuria 03/05/2011  . Hypothyroidism 06/06/2009  . FATIGUE 06/06/2009  . ELEVATED BLOOD PRESSURE 06/06/2009  . OSTEOPENIA 09/09/2008  . SCOLIOSIS 09/09/2008  . Hyperlipidemia 07/30/2008  . MIGRAINE HEADACHE 07/30/2008  . GERD 07/30/2008  . ENTHESOPATHY OF HIP REGION 07/30/2008  . HEADACHE 07/30/2008   Past Medical History:  Diagnosis Date  . Anemia   . Anxiety   . Arthritis    knees  . Atrophic vaginitis   . Cancer [facility])    breast cancer - left  . Endometriosis   . Family history of breast cancer   . Family history of colon cancer   . GERD (gastroesophageal reflux disease)   . Headache(784.0)   . Heart murmur    as a child only  . History of kidney stones    passed stones, no surgery required  . History of radiation therapy 01/30/16- 03/02/16   Left Breast 50 Gy in 25 fractions.   . Hyperlipidemia   . Hypertension   . Hypothyroidism   . Osteopenia   . Osteoporosis   . Rheumatic fever    age 76  . Sleep apnea    " very mild" does not wear CPAP  . Thyroid disease    Hypothyroid  . Ulcer   . UTI (lower urinary tract infection)     Family History  Problem Relation Age of Onset  . Breast cancer Mother        Age 41  . Hypertension Mother   . Heart disease Father   . Stroke Father   . Colon cancer Father        dx 39s  . Breast cancer Paternal Grandmother        Age 34  . Diabetes Paternal Grandmother   . Heart disease Maternal Grandmother     Past Surgical History:  Procedure Laterality Date  . APPENDECTOMY    . BREAST LUMPECTOMY Left    left may 2017  . BREAST LUMPECTOMY WITH RADIOACTIVE SEED AND SENTINEL  LYMPH NODE BIOPSY Left 09/09/2015   Procedure: LEFT BREAST LUMPECTOMY WITH RADIOACTIVE SEED AND SENTINEL LYMPH NODE BIOPSY;  Surgeon: Jackolyn Confer, MD;  Location: [facility];  Service: General;  Laterality: Left;  . COLONOSCOPY    . DILATATION & CURETTAGE/HYSTEROSCOPY WITH MYOSURE N/A 10/09/2016   Procedure: DILATATION & CURETTAGE/HYSTEROSCOPY WITH MYOSURE;  Surgeon: Princess Bruins, MD;  Location: [facility];  Service: Gynecology;  Laterality: N/A;  requesting 7:30am in our  block  requests one hour  . ESOPHAGOGASTRODUODENOSCOPY ENDOSCOPY    . IR GENERIC HISTORICAL  12/05/2015   IR CV LINE INJECTION 12/05/2015 [name], MD [facility]INTER[facility]  . LAPAROSCOPIC ENDOMETRIOSIS FULGURATION  1978  . PELVIC LAPAROSCOPY    . PORTACATH PLACEMENT N/A 10/04/2015   Procedure: INSERTION PORT-A-CATH WITH ULTRASOUND;  Surgeon: Jackolyn Confer, MD;  Location: [facility];  Service: General;  Laterality: N/A;  . portacath removal    . TONSILLECTOMY    . ULNAR NERVE REPAIR  2010   Social History   Occupational History  . Not on file  Tobacco Use  . Smoking status: Never Smoker  . Smokeless tobacco: Never Used  Substance and Sexual Activity  . Alcohol use: No  . Drug use: No  . Sexual activity: No    Birth control/protection: Post-menopausal

## 2017-03-22 NOTE — Addendum Note (Signed)
Addended by: Meyer Cory on: 03/22/2017 02:46 PM   Modules accepted: Orders

## 2017-03-28 ENCOUNTER — Other Ambulatory Visit: Payer: Self-pay | Admitting: Family Medicine

## 2017-04-17 ENCOUNTER — Ambulatory Visit (INDEPENDENT_AMBULATORY_CARE_PROVIDER_SITE_OTHER): Payer: Medicare Other | Admitting: Physical Medicine and Rehabilitation

## 2017-04-17 ENCOUNTER — Ambulatory Visit (INDEPENDENT_AMBULATORY_CARE_PROVIDER_SITE_OTHER): Payer: Medicare Other

## 2017-04-17 ENCOUNTER — Encounter (INDEPENDENT_AMBULATORY_CARE_PROVIDER_SITE_OTHER): Payer: Self-pay | Admitting: Physical Medicine and Rehabilitation

## 2017-04-17 VITALS — BP 130/68 | HR 65 | Temp 98.5°F

## 2017-04-17 DIAGNOSIS — M5416 Radiculopathy, lumbar region: Secondary | ICD-10-CM

## 2017-04-17 DIAGNOSIS — M419 Scoliosis, unspecified: Secondary | ICD-10-CM

## 2017-04-17 MED ORDER — BETAMETHASONE SOD PHOS & ACET 6 (3-3) MG/ML IJ SUSP: 12.0000 mg | Freq: Once | INTRAMUSCULAR | Status: DC

## 2017-04-17 NOTE — Progress Notes (Deleted)
Pt states lower back that radiates from her back to her belly area. Pt describes the pain to be a 5 on a scale of 1-10. Pt states the pain is "heavy pain". Pain increases with normal daily activities. Pt states pain started several years ago and just gotten worse over the years. +Driver, -BT, -Dye Allergies.

## 2017-04-17 NOTE — Patient Instructions (Signed)

## 2017-04-24 ENCOUNTER — Other Ambulatory Visit: Payer: Self-pay | Admitting: Family Medicine

## 2017-04-30 ENCOUNTER — Other Ambulatory Visit (INDEPENDENT_AMBULATORY_CARE_PROVIDER_SITE_OTHER): Payer: Medicare Other

## 2017-04-30 DIAGNOSIS — E038 Other specified hypothyroidism: Secondary | ICD-10-CM | POA: Diagnosis not present

## 2017-04-30 LAB — TSH: TSH: 1.08 u[IU]/mL (ref 0.35–4.50)

## 2017-05-01 ENCOUNTER — Encounter: Payer: Self-pay | Admitting: Family Medicine

## 2017-05-01 ENCOUNTER — Ambulatory Visit (INDEPENDENT_AMBULATORY_CARE_PROVIDER_SITE_OTHER): Payer: Medicare Other | Admitting: Family Medicine

## 2017-05-01 ENCOUNTER — Other Ambulatory Visit: Payer: Self-pay | Admitting: *Deleted

## 2017-05-01 VITALS — BP 170/70 | HR 78 | Temp 98.7°F | Wt 147.3 lb

## 2017-05-01 DIAGNOSIS — Z800 Family history of malignant neoplasm of digestive organs: Secondary | ICD-10-CM | POA: Diagnosis not present

## 2017-05-01 DIAGNOSIS — R109 Unspecified abdominal pain: Secondary | ICD-10-CM

## 2017-05-01 DIAGNOSIS — M4120 Other idiopathic scoliosis, site unspecified: Secondary | ICD-10-CM

## 2017-05-01 LAB — POCT URINALYSIS DIPSTICK
Bilirubin, UA: NEGATIVE
Blood, UA: NEGATIVE
Glucose, UA: NEGATIVE
Ketones, UA: NEGATIVE
Leukocytes, UA: NEGATIVE
Nitrite, UA: NEGATIVE
Protein, UA: NEGATIVE
Spec Grav, UA: 1.02 (ref 1.010–1.025)
Urobilinogen, UA: 0.2 E.U./dL
pH, UA: 6 (ref 5.0–8.0)

## 2017-05-01 MED ORDER — LEVOTHYROXINE SODIUM 50 MCG PO TABS: ORAL_TABLET | ORAL | 1 refills | Status: DC

## 2017-05-01 MED ORDER — GABAPENTIN 100 MG PO CAPS: 100.0000 mg | ORAL_CAPSULE | Freq: Two times a day (BID) | ORAL | 3 refills | Status: DC

## 2017-05-01 NOTE — Progress Notes (Signed)
Subjective:     Patient ID: [name], female   DOB: Oct 05, 1940, 77 y.o.   MRN: [id number]  HPI Patient seen with left lower quadrant abdominal pain and left-sided back pain. She has actually had these symptoms now for several months. We obtained CT abdomen pelvis which did not reveal etiology. She was referred to GYN. She had pelvic ultrasound and had benign-appearing cyst on the right but nothing on her left side. She was referred orthopedics and she does have history of significant scoliosis and had evidence for some possible nerve root impingement left L2-L3 nerve roots (by MRI) and has been receiving epidurals without much improvement.   Her pain is "sharp" and sometimes 8 out of 10 severity. She was prescribed oxycodone by orthopedist but patient felt it was too strong and does not plan to take any more..  She does have history of kidney stones but  this pain is somewhat different. She's not any dysuria or any gross hematuria. Her pain does radiate to the flank area. Her pain is worse with movement which is different from her kidney stone pain.  She had colonoscopy back 9 years ago. Father had colon cancer. She is overdue for repeat. She had CBC and comprehensive metabolic panel September 4166 which was unremarkable. No anemia. She has frequent constipation which is a chronic symptom.  Past Medical History:  Diagnosis Date  . Anemia   . Anxiety   . Arthritis    knees  . Atrophic vaginitis   . Cancer [facility])    breast cancer - left  . Endometriosis   . Family history of breast cancer   . Family history of colon cancer   . GERD (gastroesophageal reflux disease)   . Headache(784.0)   . Heart murmur    as a child only  . History of kidney stones    passed stones, no surgery required  . History of radiation therapy 01/30/16- 03/02/16   Left Breast 50 Gy in 25 fractions.   . Hyperlipidemia   . Hypertension   . Hypothyroidism   . Osteopenia   . Osteoporosis   . Rheumatic fever     age 2  . Sleep apnea    " very mild" does not wear CPAP  . Thyroid disease    Hypothyroid  . Ulcer   . UTI (lower urinary tract infection)    Past Surgical History:  Procedure Laterality Date  . APPENDECTOMY    . BREAST LUMPECTOMY Left    left may 2017  . BREAST LUMPECTOMY WITH RADIOACTIVE SEED AND SENTINEL LYMPH NODE BIOPSY Left 09/09/2015   Procedure: LEFT BREAST LUMPECTOMY WITH RADIOACTIVE SEED AND SENTINEL LYMPH NODE BIOPSY;  Surgeon: Jackolyn Confer, MD;  Location: [place];  Service: General;  Laterality: Left;  . COLONOSCOPY    . DILATATION & CURETTAGE/HYSTEROSCOPY WITH MYOSURE N/A 10/09/2016   Procedure: DILATATION & CURETTAGE/HYSTEROSCOPY WITH MYOSURE;  Surgeon: Princess Bruins, MD;  Location: [place] [facility];  Service: Gynecology;  Laterality: N/A;  requesting 7:30am in our block  requests one hour  . ESOPHAGOGASTRODUODENOSCOPY ENDOSCOPY    . IR GENERIC HISTORICAL  12/05/2015   IR CV LINE INJECTION 12/05/2015 [name], MD [facility]  . LAPAROSCOPIC ENDOMETRIOSIS FULGURATION  1978  . PELVIC LAPAROSCOPY    . PORTACATH PLACEMENT N/A 10/04/2015   Procedure: INSERTION PORT-A-CATH WITH ULTRASOUND;  Surgeon: Jackolyn Confer, MD;  Location: [place];  Service: General;  Laterality: N/A;  . portacath removal    . TONSILLECTOMY    .  ULNAR NERVE REPAIR  2010    reports that  has never smoked. she has never used smokeless tobacco. She reports that she does not drink alcohol or use drugs. family history includes Breast cancer in her mother and paternal grandmother; Colon cancer in her father; Diabetes in her paternal grandmother; Heart disease in her father and maternal grandmother; Hypertension in her mother; Stroke in her father. Allergies  Allergen Reactions  . Nitrofurantoin Nausea And Vomiting    GI upset  . Sulfamethoxazole-Trimethoprim Nausea And Vomiting and Other (See Comments)    GI upset     Review of Systems  Constitutional: Negative for chills and fever.  Respiratory:  Negative for shortness of breath.   Cardiovascular: Negative for chest pain and leg swelling.  Gastrointestinal: Positive for abdominal pain and constipation. Negative for diarrhea, nausea and vomiting.  Genitourinary: Negative for dysuria.  Musculoskeletal: Positive for back pain.  Skin: Negative for rash.  Hematological: Negative for adenopathy.       Objective:   Physical Exam  Constitutional: She appears well-developed and well-nourished.  Cardiovascular: Normal rate and regular rhythm.  Pulmonary/Chest: Effort normal and breath sounds normal. No respiratory distress. She has no wheezes. She has no rales.  Abdominal: Soft.  Minimal tenderness left lower quadrant to deep palpation. No guarding or rebound. No masses palpated.  Neurological:  No focal strength deficits lower extremities       Assessment:     #1 Left flank pain and lower back pain with radiation anteriorly. Suspect related to lumbar nerve root compression. Doubt kidney stone. She's had extensive workup as above which was unrevealing  #2 positive Family Hx of Colon Cancer.    #3 Hx of breast cancer-stage IA.    Plan:     -Check urine dipstick=normal- no blood. -Set up repeat colonoscopy -Consider trial of low-dose gabapentin 100 mg po qhs and may increase to one bid after 2-3 days if no side effects -reassess in a couple of weeks  [name] MD [facility] at [facility]

## 2017-05-06 NOTE — Procedures (Signed)
[name] is a 77 year old female that we saw on August left L4 transforaminal injection that seemed to have helped some with her pain.  She is followed by Dr. Lorin Mercy who requests repeat injection.  She reports a heavy pain that radiates a belly area and is a pain from a 5 on a 10 scale.  She reports several years of pain.  The injection  will be diagnostic and hopefully therapeutic. The patient has failed conservative care including time, medications and activity modification.  Lumbosacral Transforaminal Epidural Steroid Injection - Sub-Pedicular Approach with Fluoroscopic Guidance  Patient: [name]      Date of Birth: 1940-10-03 MRN: [id number] PCP: [name], MD      Visit Date: 04/17/2017   Universal Protocol:    Date/Time: 04/17/2017  Consent Given By: the patient  Position: PRONE  Additional Comments: Vital signs were monitored before and after the procedure. Patient was prepped and draped in the usual sterile fashion. The correct patient, procedure, and site was verified.   Injection Procedure Details:  Procedure Site One Meds Administered:  Meds ordered this encounter  Medications  . betamethasone acetate-betamethasone sodium phosphate (CELESTONE) injection 12 mg    Laterality: Left  Location/Site:  L4-L5  Needle size: 22 G  Needle type: Spinal  Needle Placement: Transforaminal  Findings:    -Comments: Excellent flow of contrast along the nerve and into the epidural space.  Procedure Details: After squaring off the end-plates to get a true AP view, the C-arm was positioned so that an oblique view of the foramen as noted above was visualized. The target area is just inferior to the "nose of the scotty dog" or sub pedicular. The soft tissues overlying this structure were infiltrated with 2-3 ml. of 1% Lidocaine without Epinephrine.  The spinal needle was inserted toward the target using a "trajectory" view along the fluoroscope beam.  Under AP  and lateral visualization, the needle was advanced so it did not puncture dura and was located close the 6 O'Clock position of the pedical in AP tracterory. Biplanar projections were used to confirm position. Aspiration was confirmed to be negative for CSF and/or blood. A 1-2 ml. volume of Isovue-250 was injected and flow of contrast was noted at each level. Radiographs were obtained for documentation purposes.   After attaining the desired flow of contrast documented above, a 0.5 to 1.0 ml test dose of 0.25% Marcaine was injected into each respective transforaminal space.  The patient was observed for 90 seconds post injection.  After no sensory deficits were reported, and normal lower extremity motor function was noted,   the above injectate was administered so that equal amounts of the injectate were placed at each foramen (level) into the transforaminal epidural space.   Additional Comments:  The patient tolerated the procedure well Dressing: Band-Aid    Post-procedure details: Patient was observed during the procedure. Post-procedure instructions were reviewed.  Patient left the clinic in stable condition.   Pertinent Imaging: MRI LUMBAR SPINE WITHOUT CONTRAST  TECHNIQUE: Multiplanar, multisequence MR imaging of the lumbar spine was performed. No intravenous contrast was administered.  COMPARISON:  CT abdomen and pelvis 09/05/2016.  FINDINGS: Segmentation:  Standard.  Alignment: 8 mm anterolisthesis L4 on L5. BILATERAL L4 pars defects are seen. There is significant scoliosis, convex RIGHT, centered at L1, over 40 degrees. Rightward translation L2 on L3 of approximately 9 mm.  Vertebrae: Endplate changes due to degenerative disc disease at T11-12, but no worrisome osseous lesion.  Conus  medullaris: Extends to the L1 level and appears normal.  Paraspinal and other soft tissues: Unremarkable.  Disc levels:  L1-L2: Osseous spurring extends to the LEFT. Annular  bulging no subarticular zone or foraminal zone narrowing.  L2-L3: Rightward translation L2 on L3 9 mm. Foraminal and extraforaminal disc protrusion extends to the LEFT. Posterior element hypertrophy LEFT greater than RIGHT. Diffuse disc space narrowing. LEFT L2 and LEFT L3 nerve root impingement is possible.  L3-L4: Slight rightward translation L3 on L4. Annular bulge. No definite impingement.  L4-L5: 8 mm anterolisthesis. BILATERAL L4 pars defects. Superimposed facet arthropathy. Uncovering of the disc with central protrusion. No subarticular zone narrowing, but RIGHT-sided foraminal narrowing compresses the L4 nerve root.  L5-S1:  Unremarkable disc space.  No definite impingement.  IMPRESSION: Severe scoliosis convex RIGHT at L1, greater than 40 degrees.  Rightward translation L2 on L3. Foraminal and extraforaminal protrusion extends to the LEFT. Correlate clinically for LEFT L2 and LEFT L3 nerve root impingement.  8 mm anterolisthesis L4-5 with BILATERAL L4 pars defects and superimposed facet arthropathy. RIGHT-sided foraminal narrowing affects the L4 nerve root.  No features suggesting osseous spread of metastatic breast cancer.   Electronically Signed   By: Staci Righter M.D.   On: 11/09/2016 19:44

## 2017-05-15 ENCOUNTER — Ambulatory Visit (INDEPENDENT_AMBULATORY_CARE_PROVIDER_SITE_OTHER): Payer: Medicare Other | Admitting: Family Medicine

## 2017-05-15 ENCOUNTER — Encounter: Payer: Self-pay | Admitting: Family Medicine

## 2017-05-15 VITALS — BP 130/60 | HR 66 | Temp 98.5°F | Wt 146.7 lb

## 2017-05-15 DIAGNOSIS — R109 Unspecified abdominal pain: Secondary | ICD-10-CM

## 2017-05-15 DIAGNOSIS — G8929 Other chronic pain: Secondary | ICD-10-CM

## 2017-05-15 DIAGNOSIS — Z800 Family history of malignant neoplasm of digestive organs: Secondary | ICD-10-CM

## 2017-05-15 DIAGNOSIS — R10A2 Flank pain, left side: Secondary | ICD-10-CM

## 2017-05-15 DIAGNOSIS — R030 Elevated blood-pressure reading, without diagnosis of hypertension: Secondary | ICD-10-CM | POA: Diagnosis not present

## 2017-05-15 MED ORDER — HYDROCHLOROTHIAZIDE 12.5 MG PO CAPS: 12.5000 mg | ORAL_CAPSULE | Freq: Every day | ORAL | 3 refills | Status: DC

## 2017-05-15 NOTE — Patient Instructions (Signed)
[facility]   [phone] We may increase the Gabapentin to three times daily if you are frequently having breakthrough pain.

## 2017-05-16 NOTE — Progress Notes (Signed)
Subjective:     Patient ID: [name], female   DOB: 12-23-1940, 77 y.o.   MRN: [id number]  HPI Pt seen for follow up regarding chronic LLQ abd and low back pain.  Refer to recent note from 05/01/17 for details:  "Patient seen with left lower quadrant abdominal pain and left-sided back pain. She has actually had these symptoms now for several months. We obtained CT abdomen pelvis which did not reveal etiology. She was referred to GYN. She had pelvic ultrasound and had benign-appearing cyst on the right but nothing on her left side. She was referred orthopedics and she does have history of significant scoliosis and had evidence for some possible nerve root impingement left L2-L3 nerve roots (by MRI) and has been receiving epidurals without much improvement.   Her pain is "sharp" and sometimes 8 out of 10 severity. She was prescribed oxycodone by orthopedist but patient felt it was too strong and does not plan to take any more..  She does have history of kidney stones but  this pain is somewhat different. She's not any dysuria or any gross hematuria. Her pain does radiate to the flank area. Her pain is worse with movement which is different from her kidney stone pain.  She had colonoscopy back 9 years ago. Father had colon cancer. She is overdue for repeat. She had CBC and comprehensive metabolic panel September 2376 which was unremarkable. No anemia. She has frequent constipation which is a chronic symptom."  She has been referred for repeat colonoscopy.  We suspected this might be neuropathic type pain and started low dose Gabapentin 100 mg po bid and she states her pain has gone from 8-9/10 to 1-2/10.  She is very pleased with results.  No major side effects other that some mild sedation.  BP was also fairly high last visit and back down today-?secondary to pain relief.  Past Medical History:  Diagnosis Date  . Anemia   . Anxiety   . Arthritis    knees  . Atrophic vaginitis   .  Cancer [facility])    breast cancer - left  . Endometriosis   . Family history of breast cancer   . Family history of colon cancer   . GERD (gastroesophageal reflux disease)   . Headache(784.0)   . Heart murmur    as a child only  . History of kidney stones    passed stones, no surgery required  . History of radiation therapy 01/30/16- 03/02/16   Left Breast 50 Gy in 25 fractions.   . Hyperlipidemia   . Hypertension   . Hypothyroidism   . Osteopenia   . Osteoporosis   . Rheumatic fever    age 44  . Sleep apnea    " very mild" does not wear CPAP  . Thyroid disease    Hypothyroid  . Ulcer   . UTI (lower urinary tract infection)    Past Surgical History:  Procedure Laterality Date  . APPENDECTOMY    . BREAST LUMPECTOMY Left    left may 2017  . BREAST LUMPECTOMY WITH RADIOACTIVE SEED AND SENTINEL LYMPH NODE BIOPSY Left 09/09/2015   Procedure: LEFT BREAST LUMPECTOMY WITH RADIOACTIVE SEED AND SENTINEL LYMPH NODE BIOPSY;  Surgeon: Jackolyn Confer, MD;  Location: [place];  Service: General;  Laterality: Left;  . COLONOSCOPY    . DILATATION & CURETTAGE/HYSTEROSCOPY WITH MYOSURE N/A 10/09/2016   Procedure: DILATATION & CURETTAGE/HYSTEROSCOPY WITH MYOSURE;  Surgeon: Princess Bruins, MD;  Location: [place] [facility];  Service:  Gynecology;  Laterality: N/A;  requesting 7:30am in our block  requests one hour  . ESOPHAGOGASTRODUODENOSCOPY ENDOSCOPY    . IR GENERIC HISTORICAL  12/05/2015   IR CV LINE INJECTION 12/05/2015 [name], MD [facility]  . LAPAROSCOPIC ENDOMETRIOSIS FULGURATION  1978  . PELVIC LAPAROSCOPY    . PORTACATH PLACEMENT N/A 10/04/2015   Procedure: INSERTION PORT-A-CATH WITH ULTRASOUND;  Surgeon: Jackolyn Confer, MD;  Location: [place];  Service: General;  Laterality: N/A;  . portacath removal    . TONSILLECTOMY    . ULNAR NERVE REPAIR  2010    reports that  has never smoked. she has never used smokeless tobacco. She reports that she does not drink alcohol or use drugs. family  history includes Breast cancer in her mother and paternal grandmother; Colon cancer in her father; Diabetes in her paternal grandmother; Heart disease in her father and maternal grandmother; Hypertension in her mother; Stroke in her father. Allergies  Allergen Reactions  . Nitrofurantoin Nausea And Vomiting    GI upset  . Sulfamethoxazole-Trimethoprim Nausea And Vomiting and Other (See Comments)    GI upset        Review of Systems  Constitutional: Negative for fatigue and unexpected weight change.  Eyes: Negative for visual disturbance.  Respiratory: Negative for cough, chest tightness, shortness of breath and wheezing.   Cardiovascular: Negative for chest pain, palpitations and leg swelling.  Gastrointestinal: Negative for diarrhea, nausea and vomiting.  Genitourinary: Negative for dysuria and flank pain.  Skin: Negative for rash.  Neurological: Negative for dizziness, seizures, syncope, weakness, light-headedness and headaches.       Objective:   Physical Exam  Constitutional: She appears well-developed and well-nourished.  Cardiovascular: Normal rate and regular rhythm.  Pulmonary/Chest: Effort normal and breath sounds normal. No respiratory distress. She has no wheezes. She has no rales.  Abdominal: Soft. She exhibits no mass. There is no tenderness. There is no rebound and no guarding.  Musculoskeletal: She exhibits no edema.       Assessment:     #1 chronic left flank/back pain- almost resolved with Gabapentin.  Pt is very pleased with results  #2 Hypertension- improved with better pain control  #3 family hx of colon cancer    Plan:     -GI follow up pending -continue with Gabapentin 100 mg bid- may increase to TID for any breakthrough pain -continue to monitor blood pressure.  [name] MD [facility] at [facility]

## 2017-05-18 ENCOUNTER — Other Ambulatory Visit: Payer: Self-pay | Admitting: Oncology

## 2017-06-03 ENCOUNTER — Encounter: Payer: Self-pay | Admitting: Family Medicine

## 2017-06-11 ENCOUNTER — Other Ambulatory Visit: Payer: Self-pay

## 2017-06-11 DIAGNOSIS — C50412 Malignant neoplasm of upper-outer quadrant of left female breast: Secondary | ICD-10-CM

## 2017-06-11 DIAGNOSIS — Z170 Estrogen receptor positive status [ER+]: Secondary | ICD-10-CM

## 2017-06-11 MED ORDER — TRAZODONE HCL 50 MG PO TABS: 50.0000 mg | ORAL_TABLET | Freq: Every day | ORAL | 0 refills | Status: DC

## 2017-06-13 ENCOUNTER — Other Ambulatory Visit: Payer: Self-pay | Admitting: *Deleted

## 2017-06-13 DIAGNOSIS — Z170 Estrogen receptor positive status [ER+]: Secondary | ICD-10-CM

## 2017-06-13 DIAGNOSIS — C50412 Malignant neoplasm of upper-outer quadrant of left female breast: Secondary | ICD-10-CM

## 2017-06-13 MED ORDER — TRAZODONE HCL 50 MG PO TABS: 50.0000 mg | ORAL_TABLET | Freq: Every day | ORAL | 0 refills | Status: DC

## 2017-06-30 ENCOUNTER — Other Ambulatory Visit: Payer: Self-pay | Admitting: Family Medicine

## 2017-07-10 ENCOUNTER — Other Ambulatory Visit: Payer: Self-pay | Admitting: Oncology

## 2017-07-10 DIAGNOSIS — C50412 Malignant neoplasm of upper-outer quadrant of left female breast: Secondary | ICD-10-CM

## 2017-07-10 DIAGNOSIS — Z170 Estrogen receptor positive status [ER+]: Secondary | ICD-10-CM

## 2017-07-18 ENCOUNTER — Ambulatory Visit (INDEPENDENT_AMBULATORY_CARE_PROVIDER_SITE_OTHER): Payer: Medicare Other | Admitting: Surgery

## 2017-07-18 ENCOUNTER — Encounter (INDEPENDENT_AMBULATORY_CARE_PROVIDER_SITE_OTHER): Payer: Self-pay | Admitting: Surgery

## 2017-07-18 VITALS — BP 145/70 | HR 85 | Ht 60.0 in | Wt 146.0 lb

## 2017-07-18 DIAGNOSIS — G8929 Other chronic pain: Secondary | ICD-10-CM

## 2017-07-18 DIAGNOSIS — M533 Sacrococcygeal disorders, not elsewhere classified: Secondary | ICD-10-CM

## 2017-07-18 DIAGNOSIS — M7062 Trochanteric bursitis, left hip: Secondary | ICD-10-CM

## 2017-07-18 MED ORDER — METHYLPREDNISOLONE ACETATE 40 MG/ML IJ SUSP
40.0000 mg | Freq: Once | INTRAMUSCULAR | Status: AC
  Administered 2017-07-18: 40 mg via INTRAMUSCULAR

## 2017-07-18 MED ORDER — KETOROLAC TROMETHAMINE 30 MG/ML IJ SOLN
30.0000 mg | Freq: Once | INTRAMUSCULAR | Status: AC
  Administered 2017-07-18: 30 mg via INTRAMUSCULAR

## 2017-07-18 NOTE — Progress Notes (Signed)
Office Visit Note   Patient: [name]           Date of Birth: 1940-12-29           MRN: [id number] Visit Date: 07/18/2017              Requested by: [name], MD [place], [place] [id number] PCP: [name], MD   Assessment & Plan: Visit Diagnoses:  1. Chronic left SI joint pain   2. Greater trochanteric bursitis, left     Plan: In attempt to give phase of relief today we gave Toradol 30 mg IM and Depo-Medrol 80 mg IM injections today.  We will also plan to schedule patient for diagnostic/therapeutic left SI joint and left greater trochanter bursa Marcaine/Depo-Medrol injections with Dr. [name].  Follow-up with Dr. Lorin Mercy in 6 weeks for recheck.  Patient is feeling much better after the injections today she can postpone scheduling injections with Dr. [name].  All questions answered.  Follow-Up Instructions: Return in about 6 weeks (around 08/29/2017) for With Dr. Lorin Mercy.   Orders:  Orders Placed This Encounter  Procedures  . Ambulatory referral to Physical Medicine Rehab   No orders of the defined types were placed in this encounter.     Procedures: No procedures performed   Clinical Data: No additional findings.   Subjective: Chief Complaint  Patient presents with  . Lower Back - Pain    HPI Patient comes in today with complaints of low back pain and left lateral hip pain.  Patient has severe scoliosis and lumbar spondylosis.  She states that she had pretty good improvement after most recent lumbar ESI with Dr. [name].  Complaining mostly of pain around the left SI joint lateral hip.  This is been 4 days.  No specific injury that she can recall but states that she may have overdone it a little bit because she was feeling pretty good. Review of Systems   Objective: Vital Signs: BP (!) 145/70   Pulse 85   Ht 5' (1.524 m)   Wt 146 lb (66.2 kg)   BMI 28.51 kg/m   Physical Exam  Constitutional: She is oriented to  person, place, and time. She appears well-developed. No distress.  HENT:  Head: Normocephalic.  Eyes: Pupils are equal, round, and reactive to light. EOM are normal.  Pulmonary/Chest: No respiratory distress.  Musculoskeletal:  Patient has marked tenderness over the left SI joint.  Moderate Lee tender over the left hip greater trochanter bursa.  Right SI joint less tender.  Nontender over the right lateral hip.  Neuro vascular intact.  Neurological: She is alert and oriented to person, place, and time.  Skin: Skin is warm and dry.    Ortho Exam  Specialty Comments:  No specialty comments available.  Imaging: No results found.   PMFS History: Patient Active Problem List   Diagnosis Date Noted  . Chronic midline low back pain with bilateral sciatica 11/06/2016  . Genetic testing 09/23/2015  . Malignant neoplasm of upper-outer quadrant of left breast in female, estrogen receptor positive ([place]) 09/07/2015  . Family history of breast cancer   . Family history of colon cancer   . Prediabetes 08/30/2014  . Endometriosis   . Osteoporosis   . Hypertension   . UTI (lower urinary tract infection) 03/05/2011  . Dysuria 03/05/2011  . Hypothyroidism 06/06/2009  . FATIGUE 06/06/2009  . ELEVATED BLOOD PRESSURE 06/06/2009  . OSTEOPENIA 09/09/2008  . SCOLIOSIS 09/09/2008  .  Hyperlipidemia 07/30/2008  . MIGRAINE HEADACHE 07/30/2008  . GERD 07/30/2008  . ENTHESOPATHY OF HIP REGION 07/30/2008  . HEADACHE 07/30/2008   Past Medical History:  Diagnosis Date  . Anemia   . Anxiety   . Arthritis    knees  . Atrophic vaginitis   . Cancer [facility])    breast cancer - left  . Endometriosis   . Family history of breast cancer   . Family history of colon cancer   . GERD (gastroesophageal reflux disease)   . Headache(784.0)   . Heart murmur    as a child only  . History of kidney stones    passed stones, no surgery required  . History of radiation therapy 01/30/16- 03/02/16   Left Breast  50 Gy in 25 fractions.   . Hyperlipidemia   . Hypertension   . Hypothyroidism   . Osteopenia   . Osteoporosis   . Rheumatic fever    age 53  . Sleep apnea    " very mild" does not wear CPAP  . Thyroid disease    Hypothyroid  . Ulcer   . UTI (lower urinary tract infection)     Family History  Problem Relation Age of Onset  . Breast cancer Mother        Age 76  . Hypertension Mother   . Heart disease Father   . Stroke Father   . Colon cancer Father        dx 45s  . Breast cancer Paternal Grandmother        Age 10  . Diabetes Paternal Grandmother   . Heart disease Maternal Grandmother     Past Surgical History:  Procedure Laterality Date  . APPENDECTOMY    . BREAST LUMPECTOMY Left    left may 2017  . BREAST LUMPECTOMY WITH RADIOACTIVE SEED AND SENTINEL LYMPH NODE BIOPSY Left 09/09/2015   Procedure: LEFT BREAST LUMPECTOMY WITH RADIOACTIVE SEED AND SENTINEL LYMPH NODE BIOPSY;  Surgeon: Jackolyn Confer, MD;  Location: [place];  Service: General;  Laterality: Left;  . COLONOSCOPY    . DILATATION & CURETTAGE/HYSTEROSCOPY WITH MYOSURE N/A 10/09/2016   Procedure: DILATATION & CURETTAGE/HYSTEROSCOPY WITH MYOSURE;  Surgeon: Princess Bruins, MD;  Location: [place] [facility];  Service: Gynecology;  Laterality: N/A;  requesting 7:30am in our block  requests one hour  . ESOPHAGOGASTRODUODENOSCOPY ENDOSCOPY    . IR GENERIC HISTORICAL  12/05/2015   IR CV LINE INJECTION 12/05/2015 [name], MD [facility]  . LAPAROSCOPIC ENDOMETRIOSIS FULGURATION  1978  . PELVIC LAPAROSCOPY    . PORTACATH PLACEMENT N/A 10/04/2015   Procedure: INSERTION PORT-A-CATH WITH ULTRASOUND;  Surgeon: Jackolyn Confer, MD;  Location: [place];  Service: General;  Laterality: N/A;  . portacath removal    . TONSILLECTOMY    . ULNAR NERVE REPAIR  2010   Social History   Occupational History  . Not on file  Tobacco Use  . Smoking status: Never Smoker  . Smokeless tobacco: Never Used  Substance and Sexual Activity  .  Alcohol use: No  . Drug use: No  . Sexual activity: Never    Birth control/protection: Post-menopausal

## 2017-07-18 NOTE — Addendum Note (Signed)
Addended by: Elta Guadeloupe on: 07/18/2017 03:30 PM   Modules accepted: Orders

## 2017-07-19 DIAGNOSIS — H35372 Puckering of macula, left eye: Secondary | ICD-10-CM | POA: Diagnosis not present

## 2017-07-19 DIAGNOSIS — H5213 Myopia, bilateral: Secondary | ICD-10-CM | POA: Diagnosis not present

## 2017-07-19 DIAGNOSIS — H2513 Age-related nuclear cataract, bilateral: Secondary | ICD-10-CM | POA: Diagnosis not present

## 2017-07-22 ENCOUNTER — Telehealth (INDEPENDENT_AMBULATORY_CARE_PROVIDER_SITE_OTHER): Payer: Self-pay | Admitting: Physical Medicine and Rehabilitation

## 2017-07-22 ENCOUNTER — Telehealth (INDEPENDENT_AMBULATORY_CARE_PROVIDER_SITE_OTHER): Payer: Self-pay | Admitting: *Deleted

## 2017-07-22 NOTE — Telephone Encounter (Signed)
Please advise 

## 2017-07-22 NOTE — Telephone Encounter (Signed)
I called. 2 aleve bid with food and 2 tylenol po bid discussed. FYI

## 2017-07-22 NOTE — Telephone Encounter (Signed)
Patient's husband came into the clinic to get an appointment for his wife for Dr. [name].  Thank you.

## 2017-07-23 NOTE — Telephone Encounter (Signed)
noted 

## 2017-08-05 ENCOUNTER — Ambulatory Visit (INDEPENDENT_AMBULATORY_CARE_PROVIDER_SITE_OTHER): Payer: Medicare Other | Admitting: Physical Medicine and Rehabilitation

## 2017-08-05 ENCOUNTER — Encounter (INDEPENDENT_AMBULATORY_CARE_PROVIDER_SITE_OTHER): Payer: Self-pay | Admitting: Physical Medicine and Rehabilitation

## 2017-08-05 ENCOUNTER — Ambulatory Visit (INDEPENDENT_AMBULATORY_CARE_PROVIDER_SITE_OTHER): Payer: Medicare Other

## 2017-08-05 DIAGNOSIS — M461 Sacroiliitis, not elsewhere classified: Secondary | ICD-10-CM

## 2017-08-05 NOTE — Progress Notes (Signed)
 .  Numeric Pain Rating Scale and Functional Assessment Average Pain 8   In the last MONTH (on 0-10 scale) has pain interfered with the following?  1. General activity like being  able to carry out your everyday physical activities such as walking, climbing stairs, carrying groceries, or moving a chair?  Rating(5)   +Driver, -BT, -Dye Allergies.  

## 2017-08-05 NOTE — Patient Instructions (Signed)

## 2017-08-05 NOTE — Progress Notes (Signed)
[name] - 77 y.o. female MRN [id number]  Date of birth: [birth year]  Office Visit Note: Visit Date: 08/05/2017 PCP: Stephanie Post, MD Referred by: Stephanie Post, MD  Subjective: Chief Complaint  Patient presents with  . Left Hip - Pain   HPI: [name] is a pleasant but medically complicated 77 year old patient followed by Dr. Lorin Kirby and Stephanie Core, PA-C.  [name] saw her recently and felt like a lot of her pain may be coming from the SI joint.  She comes in today for diagnostic sacroiliac joint injection and block.  Prior epidural injections were  performed on the left at L4-5.  The patient has significant scoliosis as well as listhesis and pars defects at the L4 level.  Epidural injections gave her some relief but short-lived.  The patient's husband and the patient are frustrated and the fact that she lives with 24-hour chronic pain at this point.  She has not had physical therapy.  We are going to complete diagnostic sacroiliac joint injection today.  [name] will return to follow-up with Dr. Lorin Kirby after his last visit but there was no appointment made we will make that appointment today.  Recommendations would be to follow-up with [name] at [facility] physical therapy whom she has seen in the remote past for some other issues.  Her pain is mainly at the lumbosacral area on the left just above the PSIS.  It refers somewhat laterally but no real pain over the greater trochanter.  Depending on relief could try injection over the greater trochanteric area with fluoroscopic guidance.   ROS Otherwise per HPI.  Assessment & Plan: Visit Diagnoses:  1. Sacroiliitis ([place])     Plan: No additional findings.   Meds & Orders: No orders of the defined types were placed in this encounter.  No orders of the defined types were placed in this encounter.   Follow-up: No follow-ups on file.   Procedures: Sacroiliac Joint Inj on 08/05/2017 9:35 AM Indications: pain and diagnostic  evaluation Details: 22 G 3.5 in needle, fluoroscopy-guided posterior approach Medications: 2 mL bupivacaine 0.5 %; 80 mg methylPREDNISolone acetate 80 MG/ML Outcome: tolerated well, no immediate complications  There was excellent flow of contrast producing a partial arthrogram of the sacroiliac joint.  Procedure, treatment alternatives, risks and benefits explained, specific risks discussed. Consent was given by the patient. Immediately prior to procedure a time out was called to verify the correct patient, procedure, equipment, support staff and site/side marked as required. Patient was prepped and draped in the usual sterile fashion.      No notes on file   Clinical History: MRI LUMBAR SPINE WITHOUT CONTRAST  TECHNIQUE: Multiplanar, multisequence MR imaging of the lumbar spine was performed. No intravenous contrast was administered.  COMPARISON:  CT abdomen and pelvis 09/05/2016.  FINDINGS: Segmentation:  Standard.  Alignment: 8 mm anterolisthesis L4 on L5. BILATERAL L4 pars defects are seen. There is significant scoliosis, convex RIGHT, centered at L1, over 40 degrees. Rightward translation L2 on L3 of approximately 9 mm.  Vertebrae: Endplate changes due to degenerative disc disease at T11-12, but no worrisome osseous lesion.  Conus medullaris: Extends to the L1 level and appears normal.  Paraspinal and other soft tissues: Unremarkable.  Disc levels:  L1-L2: Osseous spurring extends to the LEFT. Annular bulging no subarticular zone or foraminal zone narrowing.  L2-L3: Rightward translation L2 on L3 9 mm. Foraminal and extraforaminal disc protrusion extends to the LEFT. Posterior element hypertrophy LEFT greater than  RIGHT. Diffuse disc space narrowing. LEFT L2 and LEFT L3 nerve root impingement is possible.  L3-L4: Slight rightward translation L3 on L4. Annular bulge. No definite impingement.  L4-L5: 8 mm anterolisthesis. BILATERAL L4 pars defects.  Superimposed facet arthropathy. Uncovering of the disc with central protrusion. No subarticular zone narrowing, but RIGHT-sided foraminal narrowing compresses the L4 nerve root.  L5-S1:  Unremarkable disc space.  No definite impingement.  IMPRESSION: Severe scoliosis convex RIGHT at L1, greater than 40 degrees.  Rightward translation L2 on L3. Foraminal and extraforaminal protrusion extends to the LEFT. Correlate clinically for LEFT L2 and LEFT L3 nerve root impingement.  8 mm anterolisthesis L4-5 with BILATERAL L4 pars defects and superimposed facet arthropathy. RIGHT-sided foraminal narrowing affects the L4 nerve root.  No features suggesting osseous spread of metastatic breast cancer.   Electronically Signed   By: [name] M.D.   On: 11/09/2016 19:44   She reports that she has never smoked. She has never used smokeless tobacco. No results for input(s): HGBA1C, LABURIC in the last 8760 hours.  Objective:  VS:  HT:    WT:   BMI:     BP:   HR: bpm  TEMP: ( )  RESP:  Physical Exam  Ortho Exam Imaging: No results found.  Past Medical/Family/Surgical/Social History: Medications & Allergies reviewed per EMR, new medications updated. Patient Active Problem List   Diagnosis Date Noted  . Chronic midline low back pain with bilateral sciatica 11/06/2016  . Genetic testing 09/23/2015  . Malignant neoplasm of upper-outer quadrant of left breast in female, estrogen receptor positive ([place]) 09/07/2015  . Family history of breast cancer   . Family history of colon cancer   . Prediabetes 08/30/2014  . Endometriosis   . Osteoporosis   . Hypertension   . UTI (lower urinary tract infection) 03/05/2011  . Dysuria 03/05/2011  . Hypothyroidism 06/06/2009  . FATIGUE 06/06/2009  . ELEVATED BLOOD PRESSURE 06/06/2009  . OSTEOPENIA 09/09/2008  . SCOLIOSIS 09/09/2008  . Hyperlipidemia 07/30/2008  . MIGRAINE HEADACHE 07/30/2008  . GERD 07/30/2008  . ENTHESOPATHY OF HIP  REGION 07/30/2008  . HEADACHE 07/30/2008   Past Medical History:  Diagnosis Date  . Anemia   . Anxiety   . Arthritis    knees  . Atrophic vaginitis   . Cancer [facility])    breast cancer - left  . Endometriosis   . Family history of breast cancer   . Family history of colon cancer   . GERD (gastroesophageal reflux disease)   . Headache(784.0)   . Heart murmur    as a child only  . History of kidney stones    passed stones, no surgery required  . History of radiation therapy 01/30/16- 03/02/16   Left Breast 50 Gy in 25 fractions.   . Hyperlipidemia   . Hypertension   . Hypothyroidism   . Osteopenia   . Osteoporosis   . Rheumatic fever    age 13  . Sleep apnea    " very mild" does not wear CPAP  . Thyroid disease    Hypothyroid  . Ulcer   . UTI (lower urinary tract infection)    Family History  Problem Relation Age of Onset  . Breast cancer Mother        Age 85  . Hypertension Mother   . Heart disease Father   . Stroke Father   . Colon cancer Father        dx 8s  . Breast cancer  Paternal Grandmother        Age 28  . Diabetes Paternal Grandmother   . Heart disease Maternal Grandmother    Past Surgical History:  Procedure Laterality Date  . APPENDECTOMY    . BREAST LUMPECTOMY Left    left may 2017  . BREAST LUMPECTOMY WITH RADIOACTIVE SEED AND SENTINEL LYMPH NODE BIOPSY Left 09/09/2015   Procedure: LEFT BREAST LUMPECTOMY WITH RADIOACTIVE SEED AND SENTINEL LYMPH NODE BIOPSY;  Surgeon: Jackolyn Confer, MD;  Location: [place];  Service: General;  Laterality: Left;  . COLONOSCOPY    . DILATATION & CURETTAGE/HYSTEROSCOPY WITH MYOSURE N/A 10/09/2016   Procedure: DILATATION & CURETTAGE/HYSTEROSCOPY WITH MYOSURE;  Surgeon: Princess Bruins, MD;  Location: [place] [facility];  Service: Gynecology;  Laterality: N/A;  requesting 7:30am in our block  requests one hour  . ESOPHAGOGASTRODUODENOSCOPY ENDOSCOPY    . IR GENERIC HISTORICAL  12/05/2015   IR CV LINE INJECTION 12/05/2015 [name], MD [facility]INTER[facility] RAD  . LAPAROSCOPIC ENDOMETRIOSIS FULGURATION  1978  . PELVIC LAPAROSCOPY    . PORTACATH PLACEMENT N/A 10/04/2015   Procedure: INSERTION PORT-A-CATH WITH ULTRASOUND;  Surgeon: Jackolyn Confer, MD;  Location: [place];  Service: General;  Laterality: N/A;  . portacath removal    . TONSILLECTOMY    . ULNAR NERVE REPAIR  2010   Social History   Occupational History  . Not on file  Tobacco Use  . Smoking status: Never Smoker  . Smokeless tobacco: Never Used  Substance and Sexual Activity  . Alcohol use: No  . Drug use: No  . Sexual activity: Never    Birth control/protection: Kirby-menopausal

## 2017-08-06 MED ORDER — BUPIVACAINE HCL 0.5 % IJ SOLN
2.0000 mL | INTRAMUSCULAR | Status: AC | PRN
Start: 2017-08-05 — End: 2017-08-05
  Administered 2017-08-05: 2 mL via INTRA_ARTICULAR

## 2017-08-06 MED ORDER — METHYLPREDNISOLONE ACETATE 80 MG/ML IJ SUSP
80.0000 mg | INTRAMUSCULAR | Status: AC | PRN
Start: 2017-08-05 — End: 2017-08-05
  Administered 2017-08-05: 80 mg via INTRA_ARTICULAR

## 2017-08-09 ENCOUNTER — Encounter: Payer: Self-pay | Admitting: Internal Medicine

## 2017-08-09 ENCOUNTER — Telehealth: Payer: Self-pay | Admitting: Internal Medicine

## 2017-08-09 NOTE — Telephone Encounter (Signed)
Received colon report. Dr. Hilarie Fredrickson is Doc of the Day for 05/01/17 PM. Records placed on his desk for review.

## 2017-08-09 NOTE — Telephone Encounter (Signed)
Patient called back stating that she would like to have an office visit first because she is having L side pain. She hasn't seen a GI doctor in many years. Appointment scheduled. Just FYI

## 2017-08-09 NOTE — Telephone Encounter (Signed)
Noted  

## 2017-08-20 ENCOUNTER — Encounter (INDEPENDENT_AMBULATORY_CARE_PROVIDER_SITE_OTHER): Payer: Self-pay | Admitting: Orthopaedic Surgery

## 2017-08-20 ENCOUNTER — Ambulatory Visit (INDEPENDENT_AMBULATORY_CARE_PROVIDER_SITE_OTHER): Payer: Medicare Other | Admitting: Orthopaedic Surgery

## 2017-08-20 VITALS — BP 130/60 | HR 62 | Ht 60.0 in | Wt 145.0 lb

## 2017-08-20 DIAGNOSIS — M4120 Other idiopathic scoliosis, site unspecified: Secondary | ICD-10-CM

## 2017-08-20 NOTE — Progress Notes (Signed)
Office Visit Note   Patient: [name]           Date of Birth: March 10, 1941           MRN: [id number] Visit Date: 08/20/2017              Requested by: [name], MD [place], [place] [address] PCP: [name], MD   Assessment & Plan: Visit Diagnoses:  1. SCOLIOSIS   2.     4 5 anterolisthesis with bilateral pars defects.        Plan: Patient is gotten short-term relief with occasional steroid injection.  She has osteopenia by bone density test.  An evaluation for possible corrective surgery for progressive curvature.  We will set up appointment for her to be evaluated at [facility]  My Dr. Marvel Plan or one of his partners. Follow her can be on a   PRN basis.  Follow-Up Instructions: Return if symptoms worsen or fail to improve.   Orders:  No orders of the defined types were placed in this encounter.  No orders of the defined types were placed in this encounter.     Procedures: No procedures performed   Clinical Data: No additional findings.   Subjective: Chief Complaint  Patient presents with  . Lower Back - Pain, Follow-up    Post SI joint injection    HPI 77 year old female returns with chronic back pain, progressive scoliosis bilateral pars defects with L4-5 anterolisthesis.  She is previously gotten some short-term relief from injection on the left at the L4-5 level.  States she has pain chronically around-the-clock.  Through physical therapy.  She has pain at the lumbosacral junction worse on the left than right pain just above the PSIS.  Some pain radiates over trochanters.  We have discussed referral to [facility] back in December 2018 patient states she is agreeable to this.  Does have a past history of breast cancer no evidence of metastatic disease.  She has more pain in her back and radiates around to the left hip with ambulation.  She does not use a cane.  Relief when she stops and sits.  Pain bothers her at night when she tries  to sleep  Review of Systems positive for left breast cancer treated with radioactive seed sentinel lymph node biopsy 09/09/2015.  Port-A-Cath placed for postop chemo.  D&C  And [facility] June 2018.  Positive for migraines, hypertension hyperlipidemia hypothyroidism.  Scoliosis with apex right T12.   Objective: Vital Signs: BP 130/60   Pulse 62   Ht 5' (1.524 m)   Wt 145 lb (65.8 kg)   BMI 28.32 kg/m   Physical Exam  Constitutional: She is oriented to person, place, and time. She appears well-developed.  HENT:  Head: Normocephalic.  Right Ear: External ear normal.  Left Ear: External ear normal.  Eyes: Pupils are equal, round, and reactive to light.  Neck: No tracheal deviation present. No thyromegaly present.  Cardiovascular: Normal rate.  Pulmonary/Chest: Effort normal. No stridor. She has no wheezes. She has no rales.  Abdominal: Soft.  Neurological: She is alert and oriented to person, place, and time.  Skin: Skin is warm and dry.  Psychiatric: She has a normal mood and affect. Her behavior is normal.    Ortho Exam sibnificant  scoliosis right apex at thoraco- lumbar junction.  Normal  Hip range of motion. Negative  Straight leg raising 90 degrees.  Knee and ankle jerk 1+ and symmetrical distal  pulses are palpable.  Knees reach full extension.  No sensory dermatome deficit.  Patient is able to without limp.  Specialty Comments:  No specialty comments available.  Imaging:CLINICAL DATA:  Breast cancer, low back and LEFT leg pain.  EXAM: MRI LUMBAR SPINE WITHOUT CONTRAST  TECHNIQUE: Multiplanar, multisequence MR imaging of the lumbar spine was performed. No intravenous contrast was administered.  COMPARISON:  CT abdomen and pelvis 09/05/2016.  FINDINGS: Segmentation:  Standard.  Alignment: 8 mm anterolisthesis L4 on L5. BILATERAL L4 pars defects are seen. There is significant scoliosis, convex RIGHT, centered at L1, over 40 degrees. Rightward translation L2 on L3  of approximately 9 mm.  Vertebrae: Endplate changes due to degenerative disc disease at T11-12, but no worrisome osseous lesion.  Conus medullaris: Extends to the L1 level and appears normal.  Paraspinal and other soft tissues: Unremarkable.  Disc levels:  L1-L2: Osseous spurring extends to the LEFT. Annular bulging no subarticular zone or foraminal zone narrowing.  L2-L3: Rightward translation L2 on L3 9 mm. Foraminal and extraforaminal disc protrusion extends to the LEFT. Posterior element hypertrophy LEFT greater than RIGHT. Diffuse disc space narrowing. LEFT L2 and LEFT L3 nerve root impingement is possible.  L3-L4: Slight rightward translation L3 on L4. Annular bulge. No definite impingement.  L4-L5: 8 mm anterolisthesis. BILATERAL L4 pars defects. Superimposed facet arthropathy. Uncovering of the disc with central protrusion. No subarticular zone narrowing, but RIGHT-sided foraminal narrowing compresses the L4 nerve root.  L5-S1:  Unremarkable disc space.  No definite impingement.  IMPRESSION: Severe scoliosis convex RIGHT at L1, greater than 40 degrees.  Rightward translation L2 on L3. Foraminal and extraforaminal protrusion extends to the LEFT. Correlate clinically for LEFT L2 and LEFT L3 nerve root impingement.  8 mm anterolisthesis L4-5 with BILATERAL L4 pars defects and superimposed facet arthropathy. RIGHT-sided foraminal narrowing affects the L4 nerve root.  No features suggesting osseous spread of metastatic breast cancer.   Electronically Signed   By: Staci Righter M.D.   On: 11/09/2016 19:44     PMFS History: Patient Active Problem List   Diagnosis Date Noted  . Chronic midline low back pain with bilateral sciatica 11/06/2016  . Genetic testing 09/23/2015  . Malignant neoplasm of upper-outer quadrant of left breast in female, estrogen receptor positive ([place]) 09/07/2015  . Family history of breast cancer   . Family history of  colon cancer   . Prediabetes 08/30/2014  . Endometriosis   . Osteoporosis   . Hypertension   . UTI (lower urinary tract infection) 03/05/2011  . Dysuria 03/05/2011  . Hypothyroidism 06/06/2009  . FATIGUE 06/06/2009  . ELEVATED BLOOD PRESSURE 06/06/2009  . OSTEOPENIA 09/09/2008  . SCOLIOSIS 09/09/2008  . Hyperlipidemia 07/30/2008  . MIGRAINE HEADACHE 07/30/2008  . GERD 07/30/2008  . ENTHESOPATHY OF HIP REGION 07/30/2008  . HEADACHE 07/30/2008   Past Medical History:  Diagnosis Date  . Anemia   . Anxiety   . Arthritis    knees  . Atrophic vaginitis   . Cancer [facility])    breast cancer - left  . Endometriosis   . Family history of breast cancer   . Family history of colon cancer   . GERD (gastroesophageal reflux disease)   . Headache(784.0)   . Heart murmur    as a child only  . History of kidney stones    passed stones, no surgery required  . History of radiation therapy 01/30/16- 03/02/16   Left Breast 50 Gy in 25 fractions.   [facility]  Hyperlipidemia   . Hypertension   . Hypothyroidism   . Osteopenia   . Osteoporosis   . Rheumatic fever    age 59  . Sleep apnea    " very mild" does not wear CPAP  . Thyroid disease    Hypothyroid  . Ulcer   . UTI (lower urinary tract infection)     Family History  Problem Relation Age of Onset  . Breast cancer Mother        Age 56  . Hypertension Mother   . Heart disease Father   . Stroke Father   . Colon cancer Father        dx 67s  . Breast cancer Paternal Grandmother        Age 82  . Diabetes Paternal Grandmother   . Heart disease Maternal Grandmother     Past Surgical History:  Procedure Laterality Date  . APPENDECTOMY    . BREAST LUMPECTOMY Left    left may 2017  . BREAST LUMPECTOMY WITH RADIOACTIVE SEED AND SENTINEL LYMPH NODE BIOPSY Left 09/09/2015   Procedure: LEFT BREAST LUMPECTOMY WITH RADIOACTIVE SEED AND SENTINEL LYMPH NODE BIOPSY;  Surgeon: Jackolyn Confer, MD;  Location: [place];  Service: General;  Laterality:  Left;  . COLONOSCOPY    . DILATATION & CURETTAGE/HYSTEROSCOPY WITH MYOSURE N/A 10/09/2016   Procedure: DILATATION & CURETTAGE/HYSTEROSCOPY WITH MYOSURE;  Surgeon: Princess Bruins, MD;  Location: [place] [facility];  Service: Gynecology;  Laterality: N/A;  requesting 7:30am in our block  requests one hour  . ESOPHAGOGASTRODUODENOSCOPY ENDOSCOPY    . IR GENERIC HISTORICAL  12/05/2015   IR CV LINE INJECTION 12/05/2015 [name], MD [facility]INTER[facility]  . LAPAROSCOPIC ENDOMETRIOSIS FULGURATION  1978  . PELVIC LAPAROSCOPY    . PORTACATH PLACEMENT N/A 10/04/2015   Procedure: INSERTION PORT-A-CATH WITH ULTRASOUND;  Surgeon: Jackolyn Confer, MD;  Location: [place];  Service: General;  Laterality: N/A;  . portacath removal    . TONSILLECTOMY    . ULNAR NERVE REPAIR  2010   Social History   Occupational History  . Not on file  Tobacco Use  . Smoking status: Never Smoker  . Smokeless tobacco: Never Used  Substance and Sexual Activity  . Alcohol use: No  . Drug use: No  . Sexual activity: Never    Birth control/protection: Post-menopausal

## 2017-08-20 NOTE — Addendum Note (Signed)
Addended by: Meyer Cory on: 08/20/2017 01:14 PM   Modules accepted: Orders

## 2017-08-27 DIAGNOSIS — M4125 Other idiopathic scoliosis, thoracolumbar region: Secondary | ICD-10-CM | POA: Diagnosis not present

## 2017-08-27 DIAGNOSIS — M549 Dorsalgia, unspecified: Secondary | ICD-10-CM | POA: Diagnosis not present

## 2017-08-27 DIAGNOSIS — M419 Scoliosis, unspecified: Secondary | ICD-10-CM | POA: Diagnosis not present

## 2017-08-27 DIAGNOSIS — M8589 Other specified disorders of bone density and structure, multiple sites: Secondary | ICD-10-CM | POA: Diagnosis not present

## 2017-08-28 ENCOUNTER — Telehealth: Payer: Self-pay | Admitting: *Deleted

## 2017-08-28 ENCOUNTER — Telehealth (INDEPENDENT_AMBULATORY_CARE_PROVIDER_SITE_OTHER): Payer: Self-pay | Admitting: Orthopaedic Surgery

## 2017-08-28 NOTE — Telephone Encounter (Signed)
Received vm call from Anderson Malta, staff assist/Dr Lorette Ang Ortho-Spine Surgeon who is seeing pt for surg consult for scoliosis asking if pt is on any med for osteopenia.  Returned call & left vm  informing that no med seen on her med list.  Dr [name] did suggest exercise.  Routed to Dr [name]/Pod RN

## 2017-08-28 NOTE — Telephone Encounter (Signed)
I left voicemail for Anderson Malta advising of ESI's and SI joint injection and dates. Also MRI and last office note report by Dr. Lorin Mercy. Asked for return call if she needs anything further.

## 2017-08-28 NOTE — Telephone Encounter (Signed)
Anderson Malta from [employer] called wanting to know all treatments the patient has undergone while in Dr. Lorin Mercy care. CB # [phone]

## 2017-08-31 ENCOUNTER — Other Ambulatory Visit: Payer: Self-pay | Admitting: Family Medicine

## 2017-09-03 ENCOUNTER — Other Ambulatory Visit: Payer: Self-pay | Admitting: Orthopedic Surgery

## 2017-09-03 DIAGNOSIS — M8589 Other specified disorders of bone density and structure, multiple sites: Secondary | ICD-10-CM

## 2017-09-17 ENCOUNTER — Other Ambulatory Visit: Payer: Self-pay | Admitting: Family Medicine

## 2017-09-30 ENCOUNTER — Encounter: Payer: Self-pay | Admitting: *Deleted

## 2017-10-14 ENCOUNTER — Telehealth (INDEPENDENT_AMBULATORY_CARE_PROVIDER_SITE_OTHER): Payer: Self-pay | Admitting: Orthopaedic Surgery

## 2017-10-14 NOTE — Telephone Encounter (Signed)
Patient is asking that her handicap placard be renewed. Patients # [phone]

## 2017-10-14 NOTE — Telephone Encounter (Signed)
Ok to renew placard? How long?

## 2017-10-14 NOTE — Telephone Encounter (Signed)
OK times one year. Being seen at [facility] may be getting surgery.

## 2017-10-15 NOTE — Telephone Encounter (Signed)
I left voicemail for patient advising placard at front for pick up.

## 2017-10-21 ENCOUNTER — Ambulatory Visit: Payer: Medicare Other | Admitting: Internal Medicine

## 2017-10-21 ENCOUNTER — Encounter: Payer: Self-pay | Admitting: Internal Medicine

## 2017-10-21 VITALS — BP 98/70 | HR 63 | Ht 60.0 in | Wt 141.0 lb

## 2017-10-21 DIAGNOSIS — R1032 Left lower quadrant pain: Secondary | ICD-10-CM

## 2017-10-21 DIAGNOSIS — K5909 Other constipation: Secondary | ICD-10-CM | POA: Diagnosis not present

## 2017-10-21 DIAGNOSIS — Z800 Family history of malignant neoplasm of digestive organs: Secondary | ICD-10-CM | POA: Diagnosis not present

## 2017-10-21 MED ORDER — LINACLOTIDE 145 MCG PO CAPS: 145.0000 ug | ORAL_CAPSULE | Freq: Every day | ORAL | 3 refills | Status: DC

## 2017-10-21 NOTE — Patient Instructions (Signed)
You have been scheduled for a colonoscopy. Please follow written instructions given to you at your visit today.  Please pick up your prep supplies at the pharmacy within the next 1-3 days. If you use inhalers (even only as needed), please bring them with you on the day of your procedure. Your physician has requested that you go to www.startemmi.com and enter the access code given to you at your visit today. This web site gives a general overview about your procedure. However, you should still follow specific instructions given to you by our office regarding your preparation for the procedure.  We have sent the following medications to your pharmacy for you to pick up at your convenience: Linzess 145 mcg once daily.  If you are age 44 or older, your body mass index should be between 23-30. Your Body mass index is 27.54 kg/m. If this is out of the aforementioned range listed, please consider follow up with your Primary Care Provider.  If you are age 25 or younger, your body mass index should be between 19-25. Your Body mass index is 27.54 kg/m. If this is out of the aformentioned range listed, please consider follow up with your Primary Care Provider.

## 2017-10-21 NOTE — Progress Notes (Signed)
Patient ID: [name], female   DOB: [birth year], 77 y.o.   MRN: [id number] HPI: [name] is a 77 year old female with a family history of colon cancer in her father, history of chronic constipation, personal history of breast cancer, hypertension, hyperlipidemia, hypothyroidism who is seen in consult at the request of Dr. Elease Hashimoto to discuss left lower quadrant pain, constipation and colonoscopy.  She is here alone today.  She reports that she has had long-standing constipation.  When she is most constipated she has consistent left lower quadrant abdominal pain.  This is absent after a complete bowel movement.  Pain has not been progressive and is not associated with fever.  She has not seen blood in her stool or melena.  She reports she has a history of scoliosis and this causes leaning to the left and she wonders if this also contributes to her constipation.  She uses an over-the-counter "chocolate drink" which does help with her constipation and also occasionally milk of magnesia.  She tries to drink plenty of water and "eat right".  She has never tried prescription laxatives though she is interested in this.  Her father had colon cancer in his 88s.    She had a colonoscopy in 2011 with Dr. Earlean Shawl.  This colonoscopy is reviewed and was performed on 01/23/2010.  There was mild diverticulosis in the left colon.  There were no polyps.  The exam was complete with excellent prep however intubation was noted to be "extremely difficult due to a redundant tortuous colon".  Left lower quadrant pressure was help to deeply intubate the cecum.  Internal hemorrhoids were found.  She does report a history of reflux which she takes Zegerid with complete resolution of her heartburn symptoms.  No epigastric pain or nausea.  Good appetite.  No dysphagia.  She has noticed some dyspnea with exertion but no chest pain.  No history of tobacco smoking.  She had a CT scan to evaluate her left lower quadrant  abdominal pain which performed in May 2018.  I reviewed this study.  It showed no acute pathology.  There was mild intrahepatic and extrahepatic biliary ductal dilation, but liver enzymes were normal.  Pancreas was unremarkable.  Bowel was unremarkable though there was a large amount of stool throughout the colon.  There was a thickened endometrial stripe and she was referred to GYN and had complete evaluation.  Past Medical History:  Diagnosis Date  . Anemia   . Anxiety   . Arthritis    knees  . Atrophic vaginitis   . Cancer [facility]    breast cancer - left  . Diverticulosis   . Endometriosis   . Family history of breast cancer   . Family history of colon cancer   . GERD (gastroesophageal reflux disease)   . Headache(784.0)   . Heart murmur    as a child only  . History of kidney stones    passed stones, no surgery required  . History of radiation therapy 01/30/16- 03/02/16   Left Breast 50 Gy in 25 fractions.   . Hyperlipidemia   . Hypertension   . Hypothyroidism   . Internal hemorrhoids   . Osteopenia   . Osteoporosis   . Rheumatic fever    age 51  . Sleep apnea    " very mild" does not wear CPAP  . Thyroid disease    Hypothyroid  . Ulcer   . UTI (lower urinary tract infection)     Past Surgical History:  Procedure Laterality Date  . APPENDECTOMY    . BREAST LUMPECTOMY Left    left may 2017  . BREAST LUMPECTOMY WITH RADIOACTIVE SEED AND SENTINEL LYMPH NODE BIOPSY Left 09/09/2015   Procedure: LEFT BREAST LUMPECTOMY WITH RADIOACTIVE SEED AND SENTINEL LYMPH NODE BIOPSY;  Surgeon: Jackolyn Confer, MD;  Location: [place];  Service: General;  Laterality: Left;  . COLONOSCOPY    . DILATATION & CURETTAGE/HYSTEROSCOPY WITH MYOSURE N/A 10/09/2016   Procedure: DILATATION & CURETTAGE/HYSTEROSCOPY WITH MYOSURE;  Surgeon: Princess Bruins, MD;  Location: [facility];  Service: Gynecology;  Laterality: N/A;  requesting 7:30am in our block  requests one hour  . ESOPHAGOGASTRODUODENOSCOPY  ENDOSCOPY    . IR GENERIC HISTORICAL  12/05/2015   IR CV LINE INJECTION 12/05/2015 [name], MD [facility]  . LAPAROSCOPIC ENDOMETRIOSIS FULGURATION  1978  . PELVIC LAPAROSCOPY    . PORTACATH PLACEMENT N/A 10/04/2015   Procedure: INSERTION PORT-A-CATH WITH ULTRASOUND;  Surgeon: Jackolyn Confer, MD;  Location: [place];  Service: General;  Laterality: N/A;  . portacath removal    . TONSILLECTOMY    . ULNAR NERVE REPAIR  2010    Outpatient Medications Prior to Visit  Medication Sig Dispense Refill  . aspirin-acetaminophen-caffeine (EXCEDRIN MIGRAINE) 250-250-65 MG tablet Take 1 tablet by mouth daily as needed for headache.    Marland Kitchen atenolol (TENORMIN) 50 MG tablet TAKE 1 TABLET (50 MG TOTAL) BY MOUTH DAILY. 90 tablet 2  . chlorhexidine (PERIDEX) 0.12 % solution Use as directed 15 mLs in the mouth or throat 2 (two) times daily.     . hydrochlorothiazide (MICROZIDE) 12.5 MG capsule Take 1 capsule (12.5 mg total) by mouth daily. 90 capsule 3  . levothyroxine (SYNTHROID, LEVOTHROID) 50 MCG tablet TAKE 1 TABS BY MOUTH DAILY BEFORE BREAKFAST. (DOCTOR VISIT / LABS FOR FURTHER REFILLS) 90 tablet 1  . naproxen (NAPROSYN) 500 MG tablet Take 500 mg by mouth 3 (three) times daily.  0  . Omeprazole-Sodium Bicarbonate (ZEGERID) 20-1100 MG CAPS capsule Take 1 capsule by mouth daily before breakfast.    . simvastatin (ZOCOR) 40 MG tablet TAKE 1 TABLET BY MOUTH EVERYDAY AT BEDTIME 90 tablet 0  . tamoxifen (NOLVADEX) 20 MG tablet Take 20 mg by mouth daily.    . traZODone (DESYREL) 50 MG tablet TAKE 1 TABLET BY MOUTH EVERYDAY AT BEDTIME 30 tablet 0  . TURMERIC PO Take 320 mg by mouth daily.    Marland Kitchen venlafaxine XR (EFFEXOR-XR) 150 MG 24 hr capsule TAKE 1 CAPSULE (150 MG TOTAL) BY MOUTH DAILY. 90 capsule 0  . atenolol (TENORMIN) 50 MG tablet TAKE 1 TABLET BY MOUTH DAILY 90 tablet 1  . gabapentin (NEURONTIN) 100 MG capsule TAKE 1 CAPSULE BY MOUTH TWICE A DAY 180 capsule 0   Facility-Administered Medications Prior to  Visit  Medication Dose Route Frequency Provider Last Rate Last Dose  . betamethasone acetate-betamethasone sodium phosphate (CELESTONE) injection 12 mg  12 mg Other Once Magnus Sinning, MD        Allergies  Allergen Reactions  . Nitrofurantoin Nausea And Vomiting    GI upset  . Sulfamethoxazole-Trimethoprim Nausea And Vomiting and Other (See Comments)    GI upset    Family History  Problem Relation Age of Onset  . Breast cancer Mother        Age 36  . Hypertension Mother   . Heart disease Father   . Stroke Father   . Colon cancer Father        dx 90s  .  Breast cancer Paternal Grandmother        Age 27  . Diabetes Paternal Grandmother   . Heart disease Maternal Grandmother     Social History   Tobacco Use  . Smoking status: Never Smoker  . Smokeless tobacco: Never Used  Substance Use Topics  . Alcohol use: No  . Drug use: No    ROS: As per history of present illness, otherwise negative  BP 98/70   Pulse 63   Ht 5' (1.524 m)   Wt 141 lb (64 kg)   BMI 27.54 kg/m  Constitutional: Well-developed and well-nourished. No distress. HEENT: Normocephalic and atraumatic. Oropharynx is clear and moist. Conjunctivae are normal.  No scleral icterus. Neck: Neck supple. Trachea midline. Cardiovascular: Normal rate, regular rhythm and intact distal pulses. No M/R/G Pulmonary/chest: Effort normal and breath sounds normal. No wheezing, rales or rhonchi. Abdominal: Soft, nontender, nondistended. Bowel sounds active throughout. There are no masses palpable. No hepatosplenomegaly. Extremities: no clubbing, cyanosis, or edema Neurological: Alert and oriented to person place and time. Skin: Skin is warm and dry.  Psychiatric: Normal mood and affect. Behavior is normal.  RELEVANT LABS AND IMAGING: CBC    Component Value Date/Time   WBC 6.1 01/03/2017 1052   WBC 4.9 10/02/2016 1050   RBC 5.16 01/03/2017 1052   RBC 4.59 10/02/2016 1050   HGB 15.0 01/03/2017 1052   HCT 45.0  01/03/2017 1052   PLT 246 01/03/2017 1052   MCV 87.2 01/03/2017 1052   MCH 29.0 01/03/2017 1052   MCH 29.0 10/02/2016 1050   MCHC 33.3 01/03/2017 1052   MCHC 33.3 10/02/2016 1050   RDW 13.8 01/03/2017 1052   LYMPHSABS 1.3 01/03/2017 1052   MONOABS 0.7 01/03/2017 1052   EOSABS 0.3 01/03/2017 1052   BASOSABS 0.1 01/03/2017 1052    CMP     Component Value Date/Time   NA 140 01/03/2017 1052   K 4.5 01/03/2017 1052   CL 108 10/02/2016 1050   CO2 27 01/03/2017 1052   GLUCOSE 114 01/03/2017 1052   BUN 25.5 01/03/2017 1052   CREATININE 1.1 01/03/2017 1052   CALCIUM 10.7 (H) 01/03/2017 1052   PROT 7.4 01/03/2017 1052   ALBUMIN 4.2 01/03/2017 1052   AST 20 01/03/2017 1052   ALT 17 01/03/2017 1052   ALKPHOS 60 01/03/2017 1052   BILITOT 0.47 01/03/2017 1052   GFRNONAA 59 (L) 10/02/2016 1050   GFRAA >60 10/02/2016 1050   CT ABDOMEN AND PELVIS WITH CONTRAST   TECHNIQUE: Multidetector CT imaging of the abdomen and pelvis was performed using the standard protocol following bolus administration of intravenous contrast.   CONTRAST:  168mL ISOVUE-300 IOPAMIDOL (ISOVUE-300) INJECTION 61%   COMPARISON:  06/17/2004   FINDINGS: Lower chest: Lung bases demonstrate no acute consolidation or effusion. The heart is nonenlarged.   Hepatobiliary: Steatosis. Minimal intra hepatic biliary dilatation. Prominence of the extrahepatic common bile duct up to 8 mm. No calcified stones. Stable 13 mm cyst posterior right hepatic lobe. Additional subcentimeter hypodense lesions scattered within the liver, too small to further characterize.   Pancreas: Unremarkable. No pancreatic ductal dilatation or surrounding inflammatory changes.   Spleen: Normal in size without focal abnormality.   Adrenals/Urinary Tract: Adrenal glands within normal limits. No hydronephrosis. Subcentimeter cortical hypodense lesions within the kidneys. Bladder normal.   Stomach/Bowel: Stomach is within normal limits.  Appendix not well identified. No evidence of bowel wall thickening, distention, or inflammatory changes. Large amount of stool throughout the colon.   Vascular/Lymphatic: Aortic  atherosclerosis. No enlarged abdominal or pelvic lymph nodes.   Reproductive: Prominent endometrial stripe up to 11 mm. Parenchymal calcifications in the uterus in addition to hypodense myometrial lesion probably fibroids. No adnexal masses.   Other: No free air or free fluid.   Musculoskeletal: Marked scoliosis and degenerative changes of the spine.   IMPRESSION: 1. No CT evidence for acute intra abdominal or pelvic pathology 2. Minimal central intra hepatic end mild extrahepatic biliary dilatation. Recommend correlation with laboratory values with follow-up MRCP as indicated 3. Endometrial stripe thickening up to 11 mm. Recommend correlation with pelvic ultrasound. Probable fibroids in the uterus.     Electronically Signed   By: Donavan Foil M.D.   On: 09/05/2016 16:55    ASSESSMENT/PLAN: 77 year old female with a family history of colon cancer in her father, history of chronic constipation, personal history of breast cancer, hypertension, hyperlipidemia, hypothyroidism who is seen in consult at the request of Dr. Elease Hashimoto to discuss left lower quadrant pain, constipation and colonoscopy.   1.  Chronic constipation/left lower quadrant pain --her left lower quadrant pain is very likely related to constipation given complete resolution after complete bowel movement.  I recommended we try her on Linzess 145 mcg daily.  We discussed possible side effects including diarrhea.  If diarrhea occurs she would likely need dose reduction.  If ineffective she would need dose escalation, for trying another option.  Her colon was noted to be redundant and tortuous and this likely relates to constipation.  2.  Family history of colon cancer --surveillance colonoscopy recommended at this time given that last exam was  2011.  We discussed the risk, benefits and alternatives and she is agreeable and wishes to proceed      [place]:[id number], Alinda [place], [place], [place] [id number]

## 2017-10-22 ENCOUNTER — Telehealth (INDEPENDENT_AMBULATORY_CARE_PROVIDER_SITE_OTHER): Payer: Self-pay | Admitting: Radiology

## 2017-10-22 NOTE — Telephone Encounter (Signed)
Patient's husband brought handicap form back into the office. Dr. Lorin Mercy had written it for one year and the [facility] will only accept 6 months or 5 years.  Dr. Lorin Mercy signed new handicap placard for 5 years.   New form at front for pick up. Patient advised.

## 2017-10-27 ENCOUNTER — Other Ambulatory Visit: Payer: Self-pay | Admitting: Family Medicine

## 2017-10-28 ENCOUNTER — Telehealth: Payer: Self-pay | Admitting: Internal Medicine

## 2017-10-28 NOTE — Telephone Encounter (Signed)
If unwilling to try higher dose Linzess, meaning up to 290 mcg daily and see if this is more effective with more frequent BM, then I would recommend we try Motegrity 2 mg daily

## 2017-10-28 NOTE — Telephone Encounter (Signed)
Pt will try taking the linzess 241mcg daily.

## 2017-10-28 NOTE — Telephone Encounter (Signed)
Pt states the linzess is not working for her. Reports she took it daily for 7 days and did not have a BM. Yesterday she had terrible diarrhea and could not leave the house. She has stopped the lizess and wants to know what else she can take. Please advise.

## 2017-10-29 DIAGNOSIS — M8589 Other specified disorders of bone density and structure, multiple sites: Secondary | ICD-10-CM | POA: Diagnosis not present

## 2017-10-29 DIAGNOSIS — M4125 Other idiopathic scoliosis, thoracolumbar region: Secondary | ICD-10-CM | POA: Diagnosis not present

## 2017-10-29 DIAGNOSIS — M549 Dorsalgia, unspecified: Secondary | ICD-10-CM | POA: Diagnosis not present

## 2017-11-05 ENCOUNTER — Other Ambulatory Visit: Payer: Self-pay | Admitting: *Deleted

## 2017-11-05 MED ORDER — SIMVASTATIN 40 MG PO TABS: ORAL_TABLET | ORAL | 0 refills | Status: DC

## 2017-11-08 IMAGING — CR DG CHEST 2V
2 series · 2 of 2 positions shown · non-contrast
Comparison: 06/21/2004

CLINICAL DATA: 75-year-old female. Preoperative respiratory
examination prior to lumpectomy for breast cancer.

EXAM:
CHEST  2 VIEW

[w chest pa]
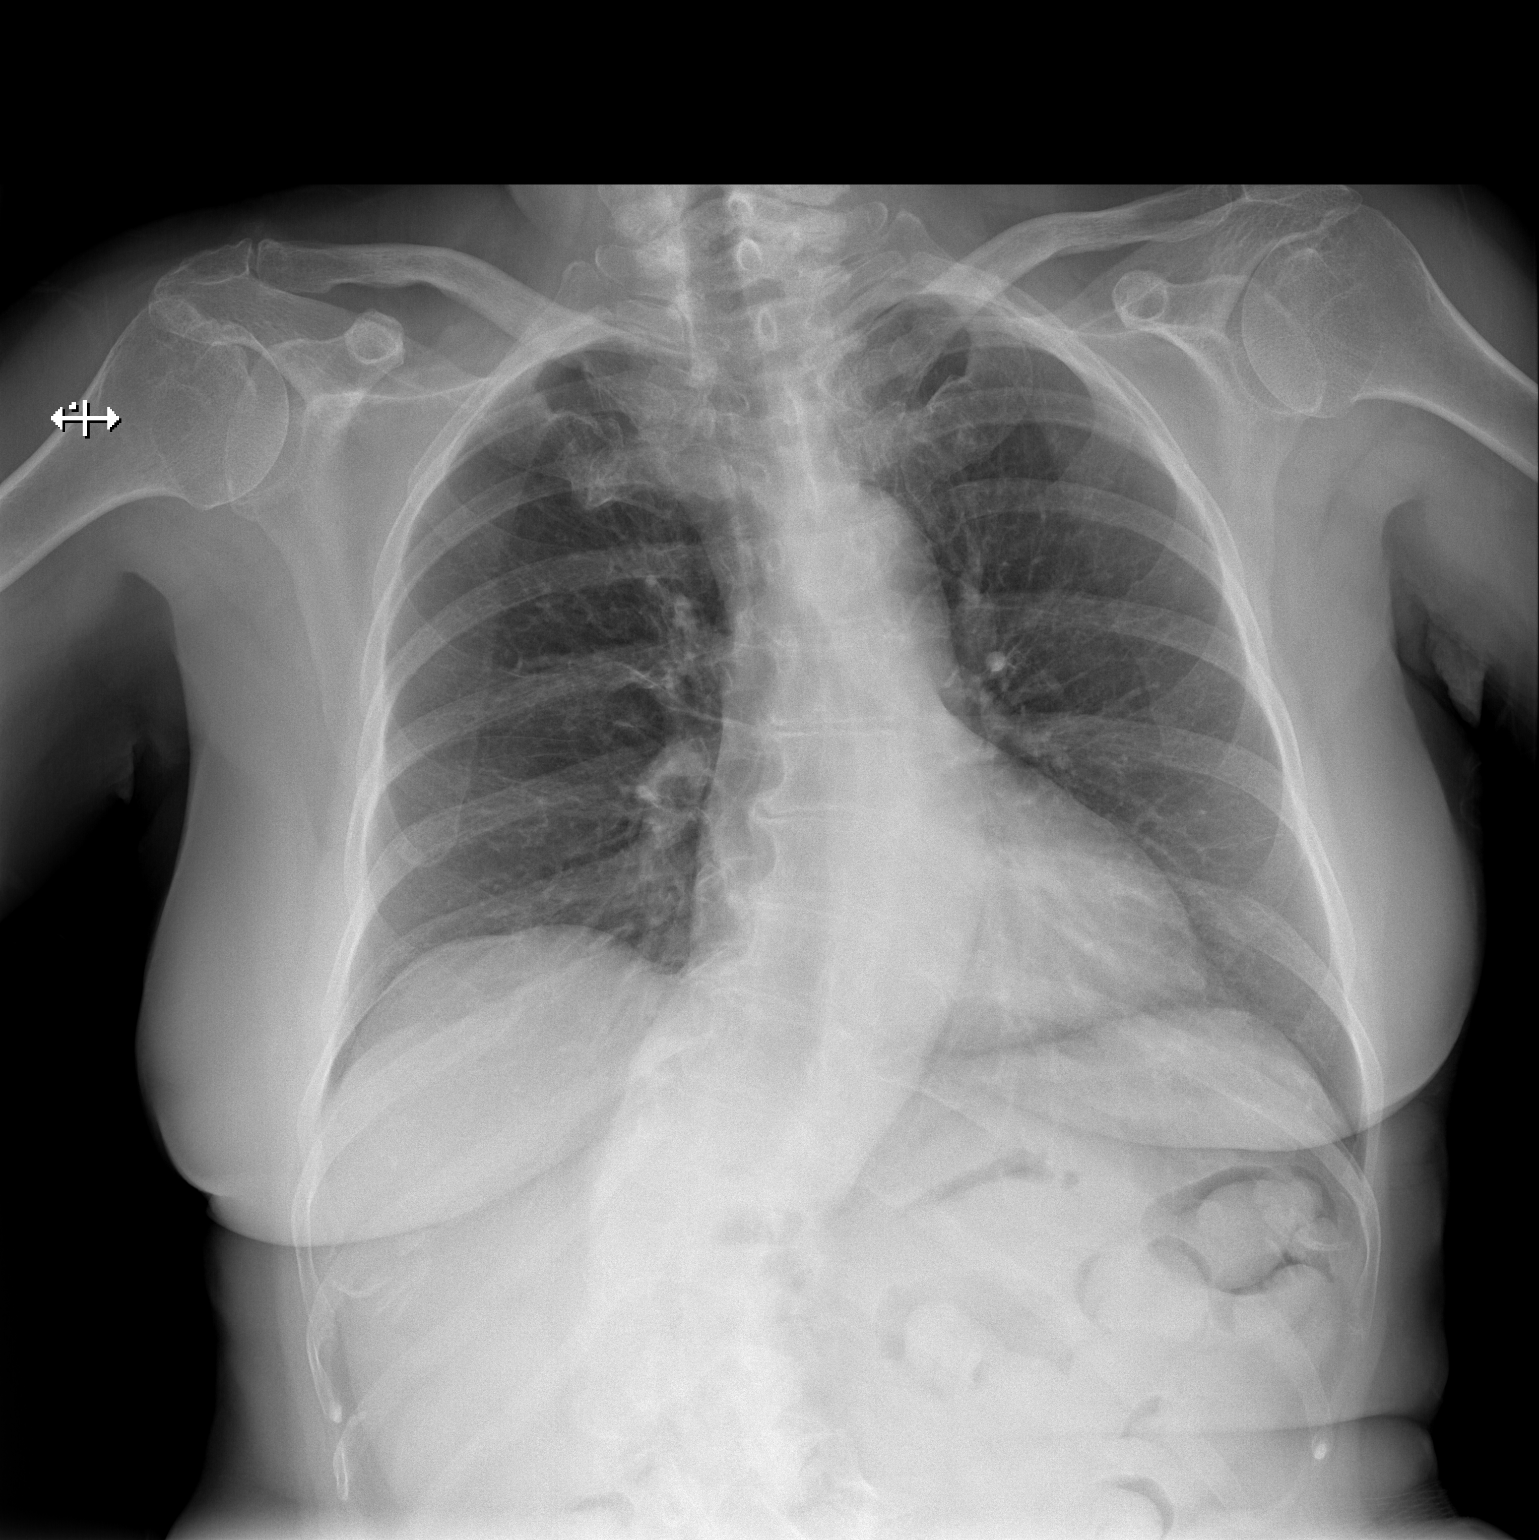

[w chest lat]
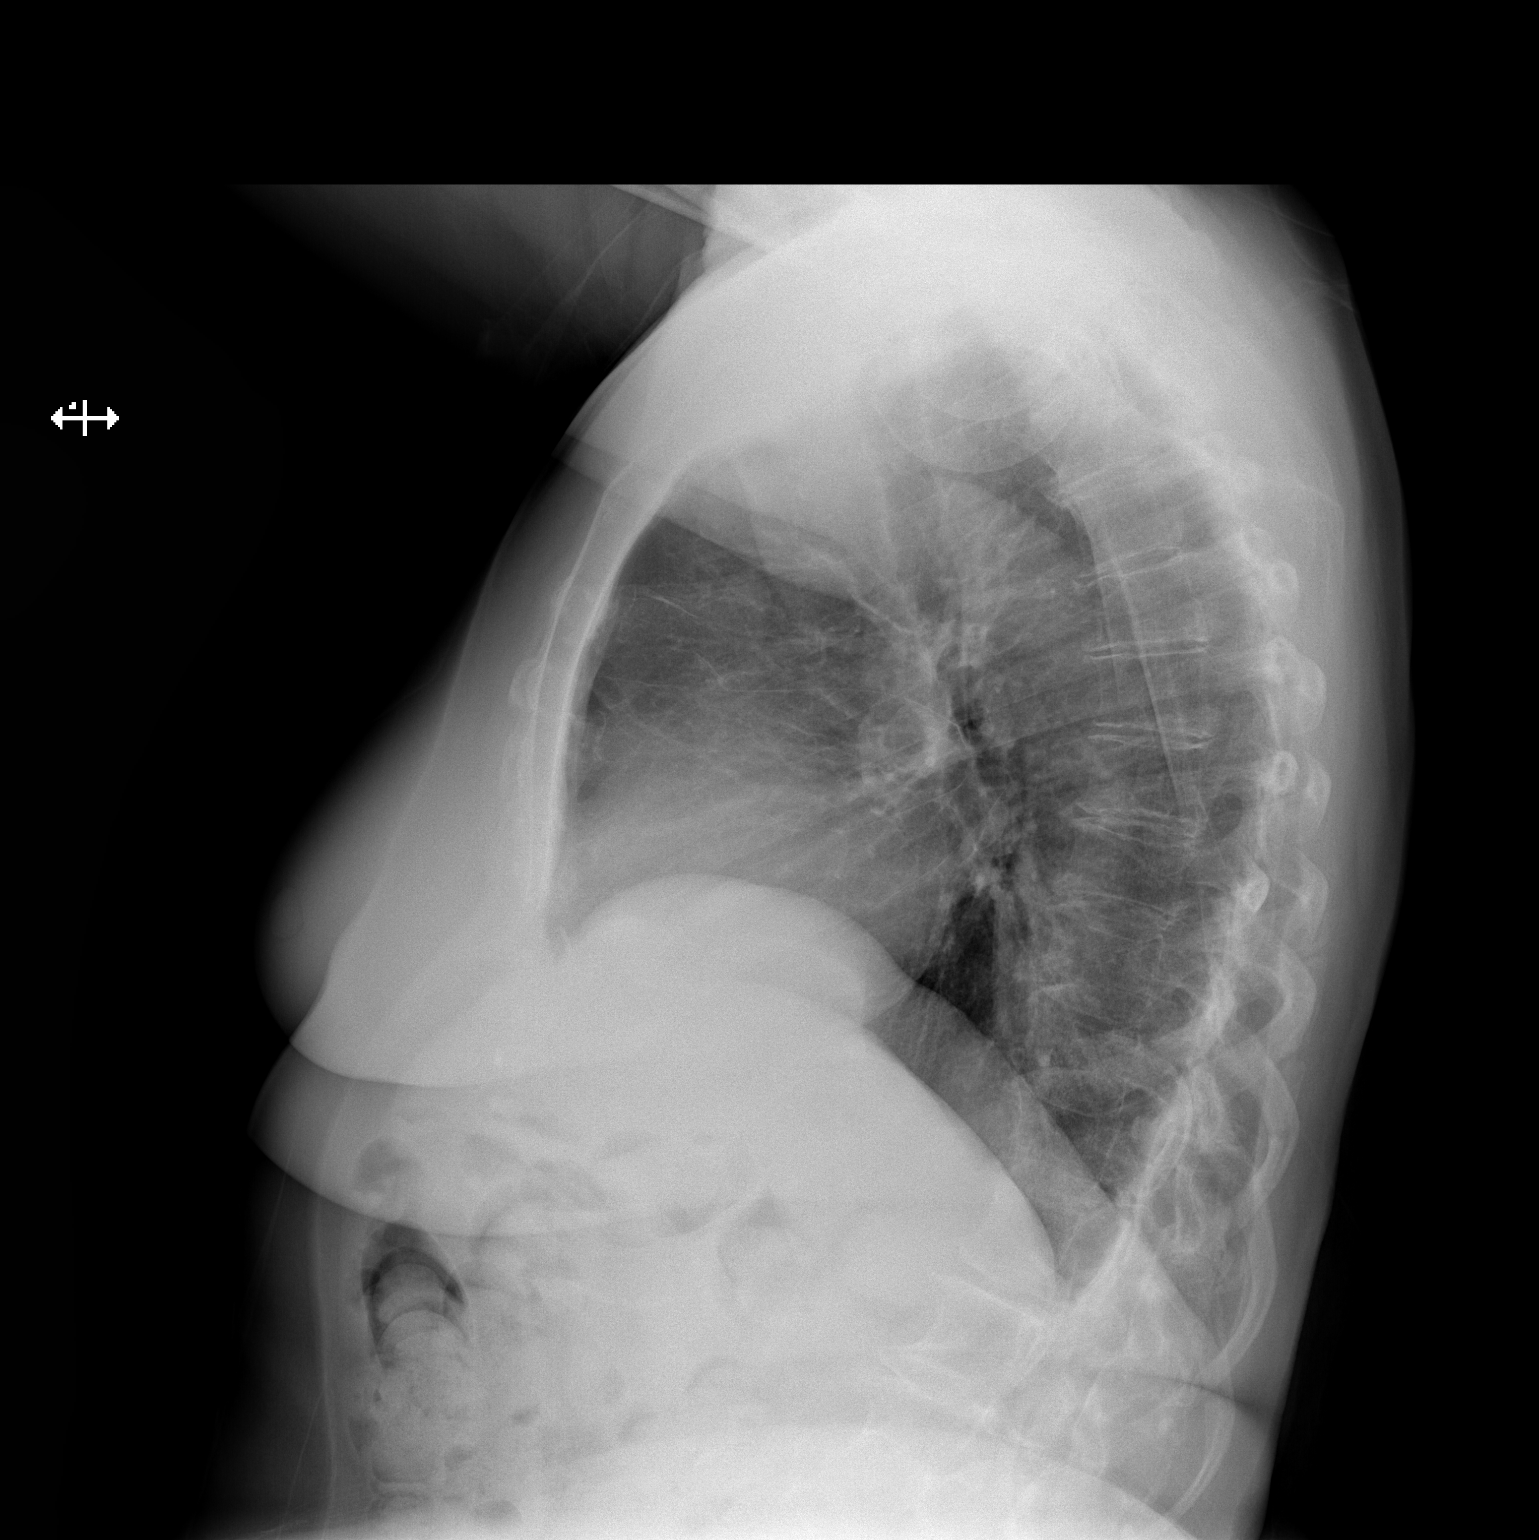

[2 of 2 positions shown; findings below may reference images not displayed]

FINDINGS: Upper limits normal heart size again noted.

There is no evidence of focal airspace disease, pulmonary edema,
suspicious pulmonary nodule/mass, pleural effusion, or pneumothorax.
No acute bony abnormalities are identified.

A moderate -severe thoracolumbar scoliosis again noted.
IMPRESSION: No active cardiopulmonary disease.

## 2017-11-10 ENCOUNTER — Other Ambulatory Visit: Payer: Self-pay | Admitting: Oncology

## 2017-11-12 ENCOUNTER — Other Ambulatory Visit: Payer: Self-pay | Admitting: Oncology

## 2017-11-12 DIAGNOSIS — C50412 Malignant neoplasm of upper-outer quadrant of left female breast: Secondary | ICD-10-CM

## 2017-11-12 DIAGNOSIS — Z170 Estrogen receptor positive status [ER+]: Secondary | ICD-10-CM

## 2017-11-27 ENCOUNTER — Other Ambulatory Visit: Payer: Self-pay | Admitting: Family Medicine

## 2017-12-07 ENCOUNTER — Other Ambulatory Visit: Payer: Self-pay | Admitting: Family Medicine

## 2017-12-17 ENCOUNTER — Encounter: Payer: Medicare Other | Admitting: Internal Medicine

## 2017-12-17 DIAGNOSIS — Z741 Need for assistance with personal care: Secondary | ICD-10-CM | POA: Diagnosis not present

## 2017-12-17 DIAGNOSIS — E785 Hyperlipidemia, unspecified: Secondary | ICD-10-CM | POA: Diagnosis not present

## 2017-12-17 DIAGNOSIS — M4125 Other idiopathic scoliosis, thoracolumbar region: Secondary | ICD-10-CM | POA: Diagnosis not present

## 2017-12-17 DIAGNOSIS — G8929 Other chronic pain: Secondary | ICD-10-CM | POA: Diagnosis not present

## 2017-12-17 DIAGNOSIS — M5416 Radiculopathy, lumbar region: Secondary | ICD-10-CM | POA: Diagnosis not present

## 2017-12-17 DIAGNOSIS — M5441 Lumbago with sciatica, right side: Secondary | ICD-10-CM | POA: Diagnosis not present

## 2017-12-17 DIAGNOSIS — R001 Bradycardia, unspecified: Secondary | ICD-10-CM | POA: Diagnosis not present

## 2017-12-17 DIAGNOSIS — R7303 Prediabetes: Secondary | ICD-10-CM | POA: Diagnosis not present

## 2017-12-17 DIAGNOSIS — M5442 Lumbago with sciatica, left side: Secondary | ICD-10-CM | POA: Diagnosis not present

## 2017-12-17 DIAGNOSIS — G43909 Migraine, unspecified, not intractable, without status migrainosus: Secondary | ICD-10-CM | POA: Diagnosis not present

## 2017-12-17 DIAGNOSIS — M4120 Other idiopathic scoliosis, site unspecified: Secondary | ICD-10-CM | POA: Diagnosis not present

## 2017-12-18 ENCOUNTER — Other Ambulatory Visit: Payer: Self-pay | Admitting: Family Medicine

## 2017-12-24 DIAGNOSIS — K5901 Slow transit constipation: Secondary | ICD-10-CM | POA: Diagnosis not present

## 2017-12-24 DIAGNOSIS — I952 Hypotension due to drugs: Secondary | ICD-10-CM | POA: Diagnosis not present

## 2017-12-24 DIAGNOSIS — E034 Atrophy of thyroid (acquired): Secondary | ICD-10-CM | POA: Diagnosis not present

## 2017-12-24 DIAGNOSIS — Z01818 Encounter for other preprocedural examination: Secondary | ICD-10-CM | POA: Diagnosis not present

## 2017-12-25 ENCOUNTER — Other Ambulatory Visit: Payer: Self-pay | Admitting: *Deleted

## 2017-12-25 DIAGNOSIS — C50412 Malignant neoplasm of upper-outer quadrant of left female breast: Secondary | ICD-10-CM

## 2017-12-25 DIAGNOSIS — Z170 Estrogen receptor positive status [ER+]: Secondary | ICD-10-CM

## 2017-12-26 ENCOUNTER — Telehealth: Payer: Self-pay | Admitting: Oncology

## 2017-12-26 ENCOUNTER — Inpatient Hospital Stay: Payer: Medicare Other

## 2017-12-26 NOTE — Telephone Encounter (Signed)
GM PAL - moved 9/19 f/u from GM to [place]. Spoke with patient and per patient she would rather see GM and push appointment out to after her surgery/recovery (surgery 9/19).   Also patient missed lab appointment this morning - per patient lab/fu moved to December with GM. Patient given new date/time for 12/11 and would also like GM to know she is having surgery 9/19. This message has been routed to GM/desk nurse.

## 2018-01-02 ENCOUNTER — Ambulatory Visit: Payer: Medicare Other | Admitting: Adult Health

## 2018-01-10 DIAGNOSIS — K5900 Constipation, unspecified: Secondary | ICD-10-CM | POA: Diagnosis not present

## 2018-01-10 DIAGNOSIS — J9601 Acute respiratory failure with hypoxia: Secondary | ICD-10-CM | POA: Diagnosis not present

## 2018-01-10 DIAGNOSIS — Z9911 Dependence on respirator [ventilator] status: Secondary | ICD-10-CM | POA: Diagnosis not present

## 2018-01-10 DIAGNOSIS — M4120 Other idiopathic scoliosis, site unspecified: Secondary | ICD-10-CM | POA: Diagnosis not present

## 2018-01-10 DIAGNOSIS — M48062 Spinal stenosis, lumbar region with neurogenic claudication: Secondary | ICD-10-CM | POA: Diagnosis not present

## 2018-01-10 DIAGNOSIS — E785 Hyperlipidemia, unspecified: Secondary | ICD-10-CM | POA: Diagnosis not present

## 2018-01-10 DIAGNOSIS — I10 Essential (primary) hypertension: Secondary | ICD-10-CM | POA: Diagnosis not present

## 2018-01-10 DIAGNOSIS — Z853 Personal history of malignant neoplasm of breast: Secondary | ICD-10-CM | POA: Diagnosis not present

## 2018-01-10 DIAGNOSIS — Z981 Arthrodesis status: Secondary | ICD-10-CM | POA: Diagnosis not present

## 2018-01-10 DIAGNOSIS — J9811 Atelectasis: Secondary | ICD-10-CM | POA: Diagnosis not present

## 2018-01-10 DIAGNOSIS — C50919 Malignant neoplasm of unspecified site of unspecified female breast: Secondary | ICD-10-CM | POA: Diagnosis not present

## 2018-01-10 DIAGNOSIS — Z9221 Personal history of antineoplastic chemotherapy: Secondary | ICD-10-CM | POA: Diagnosis not present

## 2018-01-10 DIAGNOSIS — R52 Pain, unspecified: Secondary | ICD-10-CM | POA: Diagnosis not present

## 2018-01-10 DIAGNOSIS — R410 Disorientation, unspecified: Secondary | ICD-10-CM | POA: Diagnosis not present

## 2018-01-10 DIAGNOSIS — R739 Hyperglycemia, unspecified: Secondary | ICD-10-CM | POA: Diagnosis not present

## 2018-01-10 DIAGNOSIS — M4125 Other idiopathic scoliosis, thoracolumbar region: Secondary | ICD-10-CM | POA: Diagnosis not present

## 2018-01-10 DIAGNOSIS — Z960 Presence of urogenital implants: Secondary | ICD-10-CM | POA: Diagnosis not present

## 2018-01-10 DIAGNOSIS — R7303 Prediabetes: Secondary | ICD-10-CM | POA: Diagnosis not present

## 2018-01-10 DIAGNOSIS — G43909 Migraine, unspecified, not intractable, without status migrainosus: Secondary | ICD-10-CM | POA: Diagnosis not present

## 2018-01-10 DIAGNOSIS — M25552 Pain in left hip: Secondary | ICD-10-CM | POA: Diagnosis not present

## 2018-01-10 DIAGNOSIS — M5442 Lumbago with sciatica, left side: Secondary | ICD-10-CM | POA: Diagnosis not present

## 2018-01-10 DIAGNOSIS — E861 Hypovolemia: Secondary | ICD-10-CM | POA: Diagnosis not present

## 2018-01-10 DIAGNOSIS — M4124 Other idiopathic scoliosis, thoracic region: Secondary | ICD-10-CM | POA: Diagnosis not present

## 2018-01-10 DIAGNOSIS — M549 Dorsalgia, unspecified: Secondary | ICD-10-CM | POA: Diagnosis not present

## 2018-01-10 DIAGNOSIS — M4325 Fusion of spine, thoracolumbar region: Secondary | ICD-10-CM | POA: Diagnosis not present

## 2018-01-10 DIAGNOSIS — Z7409 Other reduced mobility: Secondary | ICD-10-CM | POA: Diagnosis not present

## 2018-01-10 DIAGNOSIS — Z751 Person awaiting admission to adequate facility elsewhere: Secondary | ICD-10-CM | POA: Diagnosis not present

## 2018-01-10 DIAGNOSIS — Z79899 Other long term (current) drug therapy: Secondary | ICD-10-CM | POA: Diagnosis not present

## 2018-01-10 DIAGNOSIS — E039 Hypothyroidism, unspecified: Secondary | ICD-10-CM | POA: Diagnosis not present

## 2018-01-10 DIAGNOSIS — M5441 Lumbago with sciatica, right side: Secondary | ICD-10-CM | POA: Diagnosis not present

## 2018-01-10 DIAGNOSIS — M545 Low back pain: Secondary | ICD-10-CM | POA: Diagnosis not present

## 2018-01-10 DIAGNOSIS — M62830 Muscle spasm of back: Secondary | ICD-10-CM | POA: Diagnosis not present

## 2018-01-10 DIAGNOSIS — J811 Chronic pulmonary edema: Secondary | ICD-10-CM | POA: Diagnosis not present

## 2018-01-10 DIAGNOSIS — G8918 Other acute postprocedural pain: Secondary | ICD-10-CM | POA: Diagnosis not present

## 2018-01-10 DIAGNOSIS — K219 Gastro-esophageal reflux disease without esophagitis: Secondary | ICD-10-CM | POA: Diagnosis not present

## 2018-01-10 DIAGNOSIS — R918 Other nonspecific abnormal finding of lung field: Secondary | ICD-10-CM | POA: Diagnosis not present

## 2018-01-10 DIAGNOSIS — M4800 Spinal stenosis, site unspecified: Secondary | ICD-10-CM | POA: Diagnosis not present

## 2018-01-10 DIAGNOSIS — G5792 Unspecified mononeuropathy of left lower limb: Secondary | ICD-10-CM | POA: Diagnosis not present

## 2018-01-10 DIAGNOSIS — Z4682 Encounter for fitting and adjustment of non-vascular catheter: Secondary | ICD-10-CM | POA: Diagnosis not present

## 2018-01-10 DIAGNOSIS — G9009 Other idiopathic peripheral autonomic neuropathy: Secondary | ICD-10-CM | POA: Diagnosis not present

## 2018-01-10 DIAGNOSIS — D62 Acute posthemorrhagic anemia: Secondary | ICD-10-CM | POA: Diagnosis not present

## 2018-01-10 DIAGNOSIS — G4733 Obstructive sleep apnea (adult) (pediatric): Secondary | ICD-10-CM | POA: Diagnosis not present

## 2018-01-10 DIAGNOSIS — M48061 Spinal stenosis, lumbar region without neurogenic claudication: Secondary | ICD-10-CM | POA: Diagnosis not present

## 2018-01-10 DIAGNOSIS — M6281 Muscle weakness (generalized): Secondary | ICD-10-CM | POA: Diagnosis not present

## 2018-01-10 DIAGNOSIS — M4316 Spondylolisthesis, lumbar region: Secondary | ICD-10-CM | POA: Diagnosis not present

## 2018-01-10 DIAGNOSIS — M5440 Lumbago with sciatica, unspecified side: Secondary | ICD-10-CM | POA: Diagnosis not present

## 2018-01-10 DIAGNOSIS — G8929 Other chronic pain: Secondary | ICD-10-CM | POA: Diagnosis not present

## 2018-01-10 DIAGNOSIS — Z881 Allergy status to other antibiotic agents status: Secondary | ICD-10-CM | POA: Diagnosis not present

## 2018-01-10 DIAGNOSIS — Z4789 Encounter for other orthopedic aftercare: Secondary | ICD-10-CM | POA: Diagnosis not present

## 2018-01-10 DIAGNOSIS — G4700 Insomnia, unspecified: Secondary | ICD-10-CM | POA: Diagnosis not present

## 2018-01-20 DIAGNOSIS — G4700 Insomnia, unspecified: Secondary | ICD-10-CM | POA: Diagnosis not present

## 2018-01-20 DIAGNOSIS — G629 Polyneuropathy, unspecified: Secondary | ICD-10-CM | POA: Diagnosis not present

## 2018-01-20 DIAGNOSIS — M549 Dorsalgia, unspecified: Secondary | ICD-10-CM | POA: Diagnosis not present

## 2018-01-20 DIAGNOSIS — M6281 Muscle weakness (generalized): Secondary | ICD-10-CM | POA: Diagnosis not present

## 2018-01-20 DIAGNOSIS — M545 Low back pain: Secondary | ICD-10-CM | POA: Diagnosis not present

## 2018-01-20 DIAGNOSIS — D62 Acute posthemorrhagic anemia: Secondary | ICD-10-CM | POA: Diagnosis not present

## 2018-01-20 DIAGNOSIS — M4325 Fusion of spine, thoracolumbar region: Secondary | ICD-10-CM | POA: Diagnosis not present

## 2018-01-20 DIAGNOSIS — M5440 Lumbago with sciatica, unspecified side: Secondary | ICD-10-CM | POA: Diagnosis not present

## 2018-01-20 DIAGNOSIS — G43909 Migraine, unspecified, not intractable, without status migrainosus: Secondary | ICD-10-CM | POA: Diagnosis not present

## 2018-01-20 DIAGNOSIS — E039 Hypothyroidism, unspecified: Secondary | ICD-10-CM | POA: Diagnosis not present

## 2018-01-20 DIAGNOSIS — M4800 Spinal stenosis, site unspecified: Secondary | ICD-10-CM | POA: Diagnosis not present

## 2018-01-20 DIAGNOSIS — M25552 Pain in left hip: Secondary | ICD-10-CM | POA: Diagnosis not present

## 2018-01-20 DIAGNOSIS — C50919 Malignant neoplasm of unspecified site of unspecified female breast: Secondary | ICD-10-CM | POA: Diagnosis not present

## 2018-01-20 DIAGNOSIS — M4120 Other idiopathic scoliosis, site unspecified: Secondary | ICD-10-CM | POA: Diagnosis not present

## 2018-01-20 DIAGNOSIS — I10 Essential (primary) hypertension: Secondary | ICD-10-CM | POA: Diagnosis not present

## 2018-01-20 DIAGNOSIS — R7303 Prediabetes: Secondary | ICD-10-CM | POA: Diagnosis not present

## 2018-01-20 DIAGNOSIS — G9009 Other idiopathic peripheral autonomic neuropathy: Secondary | ICD-10-CM | POA: Diagnosis not present

## 2018-01-20 DIAGNOSIS — J9601 Acute respiratory failure with hypoxia: Secondary | ICD-10-CM | POA: Diagnosis not present

## 2018-01-20 DIAGNOSIS — E861 Hypovolemia: Secondary | ICD-10-CM | POA: Diagnosis not present

## 2018-01-20 DIAGNOSIS — M62830 Muscle spasm of back: Secondary | ICD-10-CM | POA: Diagnosis not present

## 2018-01-20 DIAGNOSIS — M5441 Lumbago with sciatica, right side: Secondary | ICD-10-CM | POA: Diagnosis not present

## 2018-01-20 DIAGNOSIS — E785 Hyperlipidemia, unspecified: Secondary | ICD-10-CM | POA: Diagnosis not present

## 2018-01-20 DIAGNOSIS — R5381 Other malaise: Secondary | ICD-10-CM | POA: Diagnosis not present

## 2018-01-20 DIAGNOSIS — K219 Gastro-esophageal reflux disease without esophagitis: Secondary | ICD-10-CM | POA: Diagnosis not present

## 2018-01-20 DIAGNOSIS — K5900 Constipation, unspecified: Secondary | ICD-10-CM | POA: Diagnosis not present

## 2018-01-20 DIAGNOSIS — Z853 Personal history of malignant neoplasm of breast: Secondary | ICD-10-CM | POA: Diagnosis not present

## 2018-01-20 DIAGNOSIS — Z4789 Encounter for other orthopedic aftercare: Secondary | ICD-10-CM | POA: Diagnosis not present

## 2018-01-20 MED ORDER — SENNOSIDES-DOCUSATE SODIUM 8.6-50 MG PO TABS
1.00 | ORAL_TABLET | ORAL | Status: DC
Start: 2018-01-20 — End: 2018-01-20

## 2018-01-20 MED ORDER — HYDRALAZINE HCL 20 MG/ML IJ SOLN
10.00 | INTRAMUSCULAR | Status: DC
Start: ? — End: 2018-01-20

## 2018-01-20 MED ORDER — VENLAFAXINE HCL ER 150 MG PO CP24
150.00 | ORAL_CAPSULE | ORAL | Status: DC
Start: 2018-01-21 — End: 2018-01-20

## 2018-01-20 MED ORDER — POLYETHYLENE GLYCOL 3350 17 G PO PACK
17.00 | PACK | ORAL | Status: DC
Start: 2018-01-21 — End: 2018-01-20

## 2018-01-20 MED ORDER — GENERIC EXTERNAL MEDICATION
4.00 | Status: DC
Start: ? — End: 2018-01-20

## 2018-01-20 MED ORDER — MELATONIN 3 MG PO TBDP
3.00 | ORAL_TABLET | ORAL | Status: DC
Start: 2018-01-20 — End: 2018-01-20

## 2018-01-20 MED ORDER — TRAZODONE HCL 50 MG PO TABS
50.00 | ORAL_TABLET | ORAL | Status: DC
Start: 2018-01-20 — End: 2018-01-20

## 2018-01-20 MED ORDER — SIMVASTATIN 40 MG PO TABS
40.00 | ORAL_TABLET | ORAL | Status: DC
Start: 2018-01-20 — End: 2018-01-20

## 2018-01-20 MED ORDER — ALUM & MAG HYDROXIDE-SIMETH 400-400-40 MG/5ML PO SUSP
30.00 | ORAL | Status: DC
Start: ? — End: 2018-01-20

## 2018-01-20 MED ORDER — BISACODYL 10 MG RE SUPP
10.00 | RECTAL | Status: DC
Start: ? — End: 2018-01-20

## 2018-01-20 MED ORDER — SIMETHICONE 80 MG PO CHEW
80.00 | CHEWABLE_TABLET | ORAL | Status: DC
Start: ? — End: 2018-01-20

## 2018-01-20 MED ORDER — GENERIC EXTERNAL MEDICATION
50.00 | Status: DC
Start: 2018-01-21 — End: 2018-01-20

## 2018-01-20 MED ORDER — TAMOXIFEN CITRATE 10 MG PO TABS
20.00 | ORAL_TABLET | ORAL | Status: DC
Start: 2018-01-21 — End: 2018-01-20

## 2018-01-20 MED ORDER — HYDROMORPHONE HCL 2 MG PO TABS
2.00 | ORAL_TABLET | ORAL | Status: DC
Start: ? — End: 2018-01-20

## 2018-01-20 MED ORDER — CARBOXYMETHYLCELLULOSE SOD PF 0.5 % OP SOLN
1.00 | OPHTHALMIC | Status: DC
Start: ? — End: 2018-01-20

## 2018-01-20 MED ORDER — LIDOCAINE HCL 1 % IJ SOLN
0.50 | INTRAMUSCULAR | Status: DC
Start: ? — End: 2018-01-20

## 2018-01-20 MED ORDER — ACETAMINOPHEN 325 MG PO TABS
975.00 | ORAL_TABLET | ORAL | Status: DC
Start: 2018-01-20 — End: 2018-01-20

## 2018-01-20 MED ORDER — DEXTROSE 50 % IV SOLN
12.50 | INTRAVENOUS | Status: DC
Start: ? — End: 2018-01-20

## 2018-01-20 MED ORDER — ATENOLOL 50 MG PO TABS
50.00 | ORAL_TABLET | ORAL | Status: DC
Start: 2018-01-21 — End: 2018-01-20

## 2018-01-20 MED ORDER — ENOXAPARIN SODIUM 40 MG/0.4ML ~~LOC~~ SOLN
40.00 | SUBCUTANEOUS | Status: DC
Start: 2018-01-21 — End: 2018-01-20

## 2018-01-20 MED ORDER — PANTOPRAZOLE SODIUM 40 MG PO TBEC
40.00 | DELAYED_RELEASE_TABLET | ORAL | Status: DC
Start: 2018-01-21 — End: 2018-01-20

## 2018-01-20 MED ORDER — GENERIC EXTERNAL MEDICATION
1.00 | Status: DC
Start: ? — End: 2018-01-20

## 2018-01-20 MED ORDER — HYDROMORPHONE HCL 1 MG/ML IJ SOLN
0.25 | INTRAMUSCULAR | Status: DC
Start: ? — End: 2018-01-20

## 2018-01-21 DIAGNOSIS — R5381 Other malaise: Secondary | ICD-10-CM | POA: Diagnosis not present

## 2018-01-22 DIAGNOSIS — R5381 Other malaise: Secondary | ICD-10-CM | POA: Diagnosis not present

## 2018-01-23 DIAGNOSIS — R5381 Other malaise: Secondary | ICD-10-CM | POA: Diagnosis not present

## 2018-01-23 DIAGNOSIS — G4700 Insomnia, unspecified: Secondary | ICD-10-CM | POA: Diagnosis not present

## 2018-01-23 DIAGNOSIS — G629 Polyneuropathy, unspecified: Secondary | ICD-10-CM | POA: Diagnosis not present

## 2018-01-27 DIAGNOSIS — R5381 Other malaise: Secondary | ICD-10-CM | POA: Diagnosis not present

## 2018-01-30 DIAGNOSIS — R5381 Other malaise: Secondary | ICD-10-CM | POA: Diagnosis not present

## 2018-02-01 ENCOUNTER — Other Ambulatory Visit: Payer: Self-pay | Admitting: Family Medicine

## 2018-02-03 ENCOUNTER — Telehealth: Payer: Self-pay | Admitting: Family Medicine

## 2018-02-03 DIAGNOSIS — R5381 Other malaise: Secondary | ICD-10-CM | POA: Diagnosis not present

## 2018-02-03 NOTE — Telephone Encounter (Signed)
Last OV 05/15/17, Next OV 02/10/18  Please advise.

## 2018-02-03 NOTE — Telephone Encounter (Signed)
Copied from [place] [id number]. Topic: Quick Communication - See Telephone Encounter >> Feb 03, 2018  9:52 AM Blase Mess A wrote: CRM for notification. See Telephone encounter for: 02/03/18. Courtney from [facility] in [place] is calling because the patient is requesting outpatient PT  Please advise [phone]

## 2018-02-04 NOTE — Telephone Encounter (Signed)
OK to set up 

## 2018-02-04 NOTE — Telephone Encounter (Signed)
Called [place] and gave her the OK per Dr. Elease Hashimoto and she stated that the patient left yesterday.

## 2018-02-05 ENCOUNTER — Other Ambulatory Visit: Payer: Medicare Other

## 2018-02-07 IMAGING — XA IR CENTRAL VENOUS CATHETER
3 series · 6 of 6 positions shown · IV contrast (IODINE)
Comparison: none

INDICATION: Difficulty port access

[Series 1: care single · 1 of 1 slices shown]
[im 1/1]
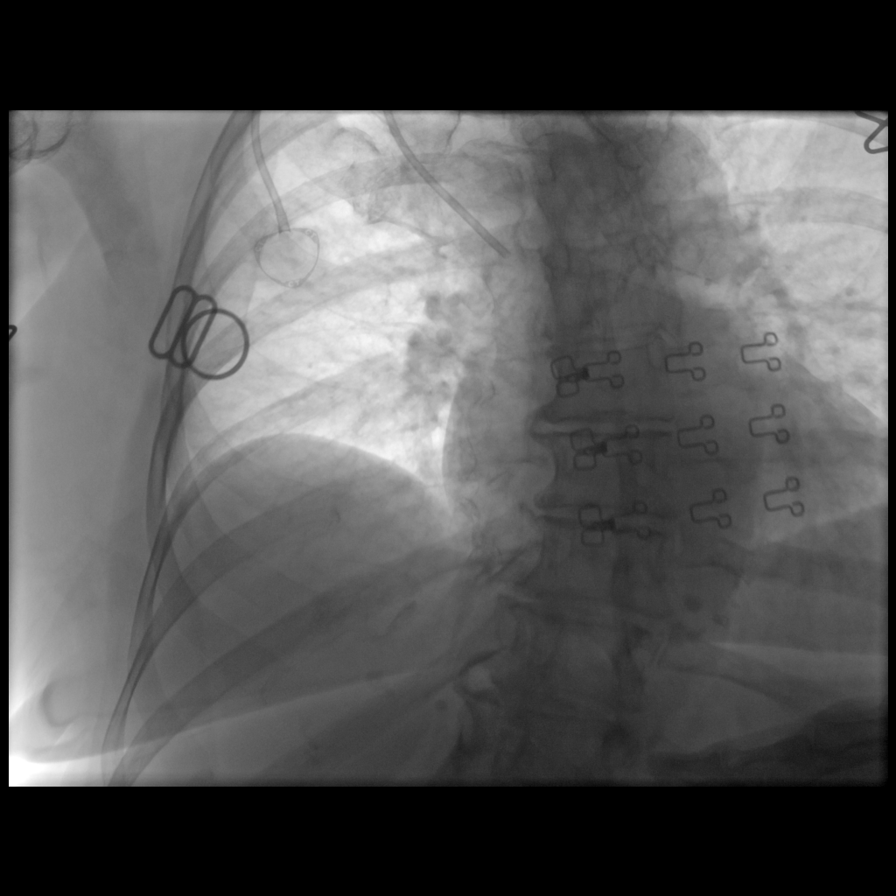

[Series 3: shuntogram · 4 of 13 frames shown]
[frame 2/13]
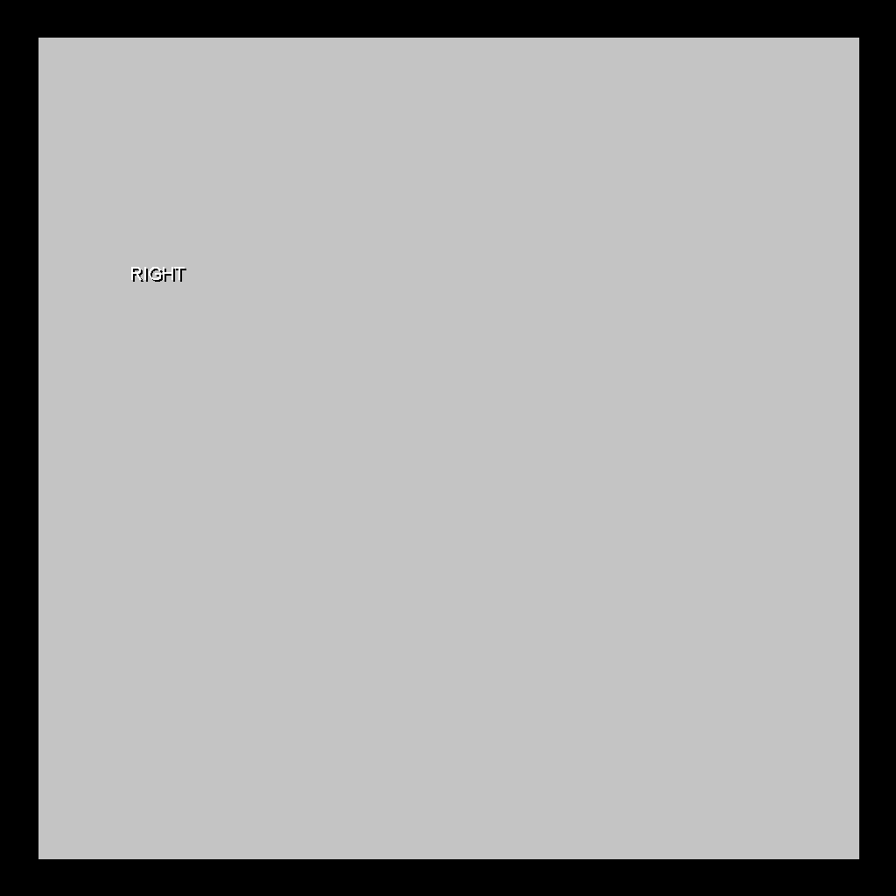
[frame 7/13]
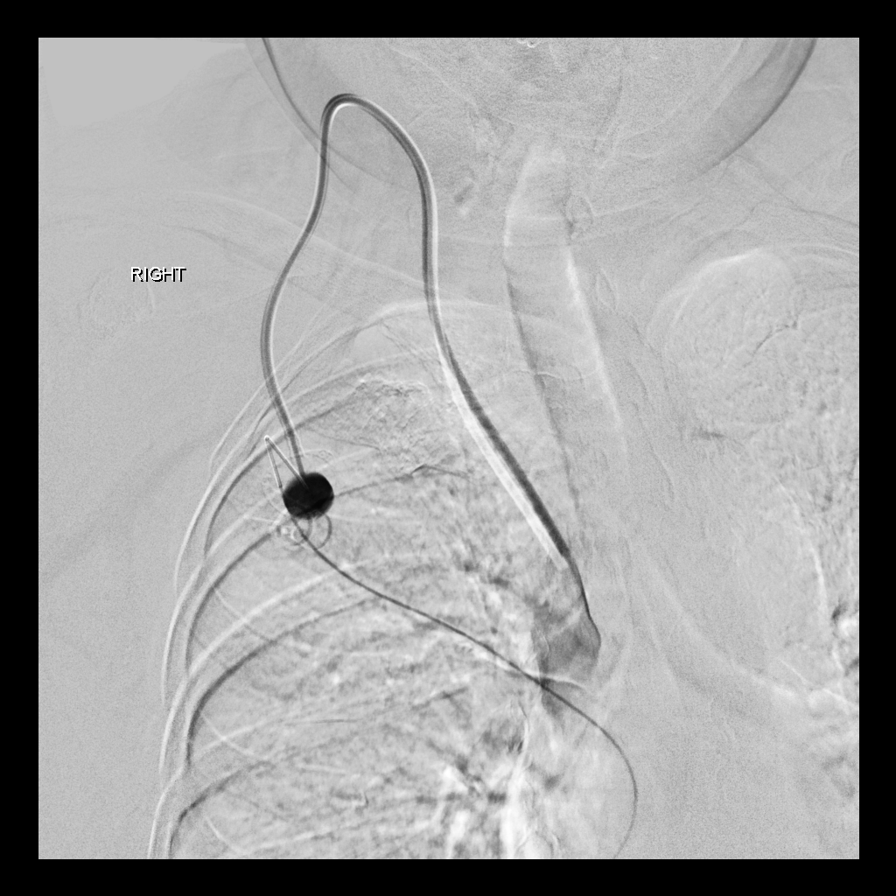
[frame 11/13]
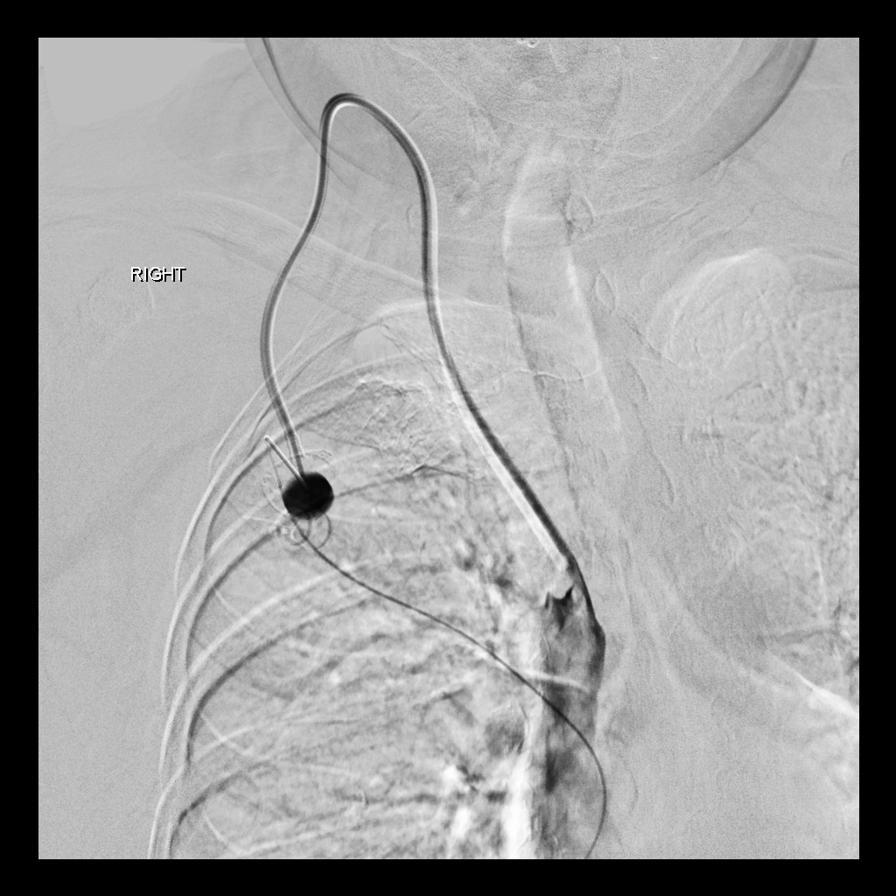
[frame 12/13]
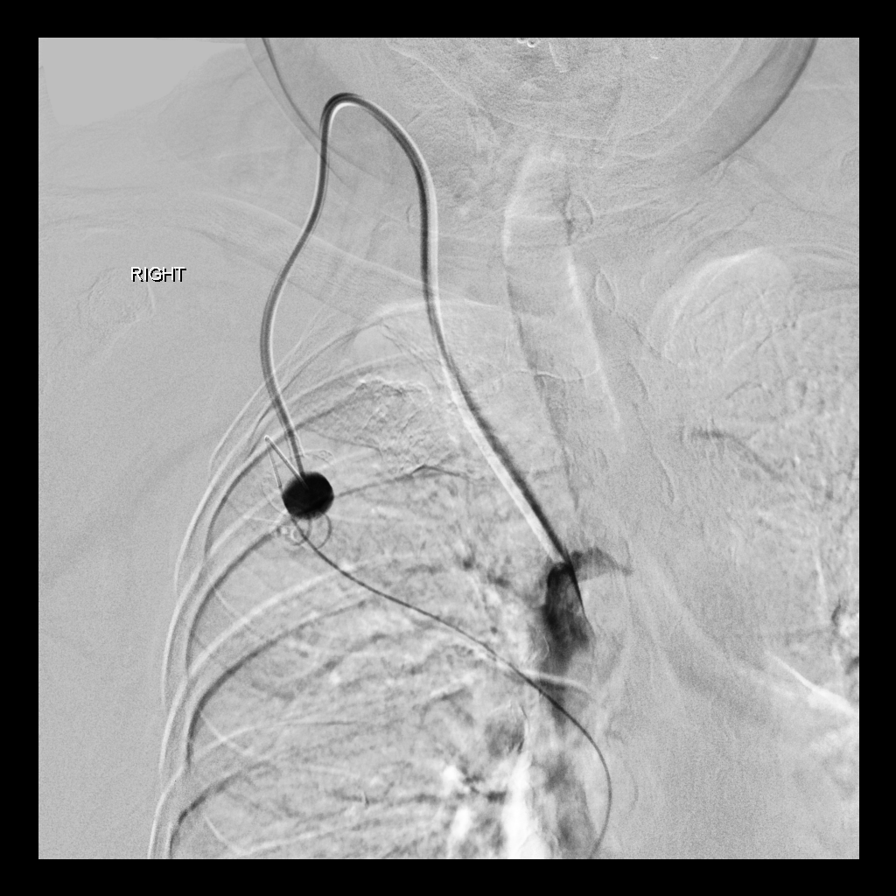

[Series 300: line placements · 1 of 1 slices shown]
[im 1/1]
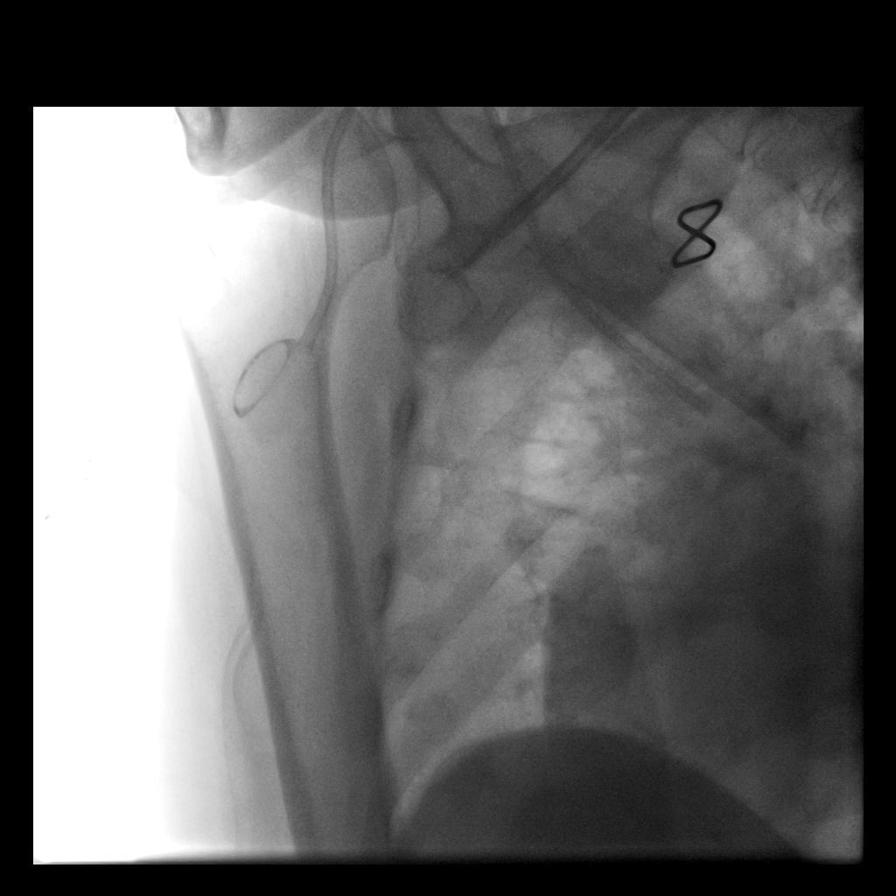

[6 of 6 positions shown; findings below may reference images not displayed]

EXAM:
CENTRAL VENOUS CATHETER

MEDICATIONS:
None.

ANESTHESIA/SEDATION:
None.

FLUOROSCOPY TIME:  Fluoroscopy Time:  minutes 24 seconds (19 mGy).

COMPLICATIONS:
None immediate.

PROCEDURE:
Informed written consent was obtained from the patient after a
thorough discussion of the procedural risks, benefits and
alternatives. All questions were addressed. Maximal Sterile Barrier
Technique was utilized including caps, mask, sterile gowns, sterile
gloves, sterile drape, hand hygiene and skin antiseptic. A timeout
was performed prior to the initiation of the procedure.

The right upper chest was prepped and draped in a sterile fashion.
The Aracely nerve needle was advanced through the skin and into the
port reservoir. Contrast was injected. The port was then flushed and
instilled with heparinized saline.
FINDINGS: The port is patent and intact.  The tip is in the SVC.
IMPRESSION: The port is patent.  The tip is in the SVC.

## 2018-02-10 ENCOUNTER — Encounter: Payer: Self-pay | Admitting: Family Medicine

## 2018-02-10 ENCOUNTER — Other Ambulatory Visit: Payer: Self-pay

## 2018-02-10 ENCOUNTER — Ambulatory Visit (INDEPENDENT_AMBULATORY_CARE_PROVIDER_SITE_OTHER): Payer: Medicare Other | Admitting: Family Medicine

## 2018-02-10 VITALS — BP 124/74 | HR 77 | Temp 98.0°F | Ht 60.0 in | Wt 140.7 lb

## 2018-02-10 DIAGNOSIS — E785 Hyperlipidemia, unspecified: Secondary | ICD-10-CM | POA: Diagnosis not present

## 2018-02-10 DIAGNOSIS — M4120 Other idiopathic scoliosis, site unspecified: Secondary | ICD-10-CM | POA: Diagnosis not present

## 2018-02-10 DIAGNOSIS — D649 Anemia, unspecified: Secondary | ICD-10-CM

## 2018-02-10 DIAGNOSIS — I10 Essential (primary) hypertension: Secondary | ICD-10-CM

## 2018-02-10 DIAGNOSIS — E038 Other specified hypothyroidism: Secondary | ICD-10-CM

## 2018-02-10 DIAGNOSIS — Z23 Encounter for immunization: Secondary | ICD-10-CM | POA: Diagnosis not present

## 2018-02-10 LAB — CBC WITH DIFFERENTIAL/PLATELET
Basophils Absolute: 0.1 10*3/uL (ref 0.0–0.1)
Basophils Relative: 1.2 % (ref 0.0–3.0)
Eosinophils Absolute: 0.2 10*3/uL (ref 0.0–0.7)
Eosinophils Relative: 3.3 % (ref 0.0–5.0)
HCT: 34.6 % — ABNORMAL LOW (ref 36.0–46.0)
Hemoglobin: 11.4 g/dL — ABNORMAL LOW (ref 12.0–15.0)
Lymphocytes Relative: 20.4 % (ref 12.0–46.0)
Lymphs Abs: 1.2 10*3/uL (ref 0.7–4.0)
MCHC: 32.9 g/dL (ref 30.0–36.0)
MCV: 86.2 fl (ref 78.0–100.0)
Monocytes Absolute: 0.7 10*3/uL (ref 0.1–1.0)
Monocytes Relative: 13 % — ABNORMAL HIGH (ref 3.0–12.0)
Neutro Abs: 3.5 10*3/uL (ref 1.4–7.7)
Neutrophils Relative %: 62.1 % (ref 43.0–77.0)
Platelets: 375 10*3/uL (ref 150.0–400.0)
RBC: 4.02 Mil/uL (ref 3.87–5.11)
RDW: 15.9 % — ABNORMAL HIGH (ref 11.5–15.5)
WBC: 5.7 10*3/uL (ref 4.0–10.5)

## 2018-02-10 LAB — COMPREHENSIVE METABOLIC PANEL
ALT: 13 U/L (ref 0–35)
AST: 15 U/L (ref 0–37)
Albumin: 4.3 g/dL (ref 3.5–5.2)
Alkaline Phosphatase: 137 U/L — ABNORMAL HIGH (ref 39–117)
BUN: 22 mg/dL (ref 6–23)
CO2: 28 mEq/L (ref 19–32)
Calcium: 9.5 mg/dL (ref 8.4–10.5)
Chloride: 104 mEq/L (ref 96–112)
Creatinine, Ser: 0.85 mg/dL (ref 0.40–1.20)
GFR: 68.79 mL/min (ref >60.00)
Glucose, Bld: 117 mg/dL — ABNORMAL HIGH (ref 70–99)
Potassium: 5.1 mEq/L (ref 3.5–5.1)
Sodium: 140 mEq/L (ref 135–145)
Total Bilirubin: 0.4 mg/dL (ref 0.2–1.2)
Total Protein: 6.7 g/dL (ref 6.0–8.3)

## 2018-02-10 LAB — TSH: TSH: 0.72 u[IU]/mL (ref 0.35–4.50)

## 2018-02-10 LAB — LIPID PANEL
Cholesterol: 147 mg/dL (ref 0–200)
HDL: 41.8 mg/dL (ref >39.00)
NonHDL: 105.15
Total CHOL/HDL Ratio: 4
Triglycerides: 204 mg/dL — ABNORMAL HIGH (ref 0.0–149.0)
VLDL: 40.8 mg/dL — ABNORMAL HIGH (ref 0.0–40.0)

## 2018-02-10 LAB — LDL CHOLESTEROL, DIRECT: Direct LDL: 75 mg/dL

## 2018-02-10 NOTE — Progress Notes (Signed)
Subjective:     Patient ID: [name], female   DOB: Jan 27, 1941, 77 y.o.   MRN: [id number]  HPI Patient seen for follow-up regarding recent hospitalization and surgery at [facility] for idiopathic scoliosis of the thoracolumbar region.  She had posterior/posterior lateral thoracic arthrodesis and her surgery apparently went very well.  No complications.  She was discharged to rehab facility and just came home from there about a week ago.  She is in process of getting up with home physical therapy.  She has a brace that she has to wear for 2 more weeks.  She is ambulating without difficulty.  Appetite's been stable.  Hospital discharge labs included potassium 3.4.  Her hemoglobin was 8.7.  It appears that she probably received a unit of packed red blood cells.  Her hemoglobin got down low 7 range.  She denies any dizziness at this time.  Mild fatigue.  No dyspnea.  No fevers or chills.  Other medical problems include history of hypothyroidism, hyperlipidemia, and migraine headaches.  She is due for other labs at this time including lipids and thyroid functions.  Compliant with medications with exception that she has HCTZ on her med list and she was not aware of taking this.  Past Medical History:  Diagnosis Date  . Anemia   . Anxiety   . Arthritis    knees  . Atrophic vaginitis   . Cancer [facility]    breast cancer - left  . Diverticulosis   . Endometriosis   . Family history of breast cancer   . Family history of colon cancer   . GERD (gastroesophageal reflux disease)   . Headache(784.0)   . Heart murmur    as a child only  . History of kidney stones    passed stones, no surgery required  . History of radiation therapy 01/30/16- 03/02/16   Left Breast 50 Gy in 25 fractions.   . Hyperlipidemia   . Hypertension   . Hypothyroidism   . Internal hemorrhoids   . Osteopenia   . Osteoporosis   . Rheumatic fever    age 73  . Sleep apnea    " very mild" does not wear CPAP  .  Thyroid disease    Hypothyroid  . Ulcer   . UTI (lower urinary tract infection)    Past Surgical History:  Procedure Laterality Date  . APPENDECTOMY    . BREAST LUMPECTOMY Left    left may 2017  . BREAST LUMPECTOMY WITH RADIOACTIVE SEED AND SENTINEL LYMPH NODE BIOPSY Left 09/09/2015   Procedure: LEFT BREAST LUMPECTOMY WITH RADIOACTIVE SEED AND SENTINEL LYMPH NODE BIOPSY;  Surgeon: Jackolyn Confer, MD;  Location: [place];  Service: General;  Laterality: Left;  . COLONOSCOPY    . DILATATION & CURETTAGE/HYSTEROSCOPY WITH MYOSURE N/A 10/09/2016   Procedure: DILATATION & CURETTAGE/HYSTEROSCOPY WITH MYOSURE;  Surgeon: Princess Bruins, MD;  Location: [facility];  Service: Gynecology;  Laterality: N/A;  requesting 7:30am in our block  requests one hour  . ESOPHAGOGASTRODUODENOSCOPY ENDOSCOPY    . IR GENERIC HISTORICAL  12/05/2015   IR CV LINE INJECTION 12/05/2015 [name], MD [facility]  . LAPAROSCOPIC ENDOMETRIOSIS FULGURATION  1978  . PELVIC LAPAROSCOPY    . PORTACATH PLACEMENT N/A 10/04/2015   Procedure: INSERTION PORT-A-CATH WITH ULTRASOUND;  Surgeon: Jackolyn Confer, MD;  Location: [place];  Service: General;  Laterality: N/A;  . portacath removal    . TONSILLECTOMY    . ULNAR NERVE REPAIR  2010  reports that she has never smoked. She has never used smokeless tobacco. She reports that she does not drink alcohol or use drugs. family history includes Breast cancer in her mother and paternal grandmother; Colon cancer in her father; Diabetes in her paternal grandmother; Heart disease in her father and maternal grandmother; Hypertension in her mother; Stroke in her father. Allergies  Allergen Reactions  . Nitrofurantoin Nausea And Vomiting    GI upset  . Sulfamethoxazole-Trimethoprim Nausea And Vomiting and Other (See Comments)    GI upset     Review of Systems  Constitutional: Negative for chills and fever.  Respiratory: Negative for cough and shortness of breath.   Cardiovascular:  Negative for chest pain.  Gastrointestinal: Negative for abdominal pain.  Genitourinary: Negative for dysuria.  Neurological: Negative for dizziness, seizures, syncope and speech difficulty.       Objective:   Physical Exam  Constitutional: She appears well-developed and well-nourished.  Cardiovascular: Normal rate and regular rhythm.  Pulmonary/Chest: Effort normal and breath sounds normal.  Musculoskeletal: She exhibits no edema.  Skin:  He has long incision on her back running from the lower cervical region all the way down the lower lumbar region.  Healing well.  No signs of secondary infection.  Conjunctive and nailbeds are slightly pale       Assessment:     #1 recent extensive back surgery for thoracolumbar scoliosis doing well  #2 hypothyroidism  #3 dyslipidemia  #4 history of hypertension currently stable  #5 recent mild hypokalemia during hospitalization  #6 postsurgical anemia    Plan:     -Flu vaccine given -Check follow-up labs with CBC, comprehensive metabolic panel, lipid panel, TSH -Continue with home physical therapy -We will determine follow-up interval after getting back labs -We have asked that she confirm whether she is taking HCTZ  [name] MD [facility]

## 2018-02-10 NOTE — Patient Instructions (Signed)
Iron-Rich Diet Iron is a mineral that helps your body to produce hemoglobin. Hemoglobin is a protein in your red blood cells that carries oxygen to your body's tissues. Eating too little iron may cause you to feel weak and tired, and it can increase your risk for infection. Eating enough iron is necessary for your body's metabolism, muscle function, and nervous system. Iron is naturally found in many foods. It can also be added to foods or fortified in foods. There are two types of dietary iron:  Heme iron. Heme iron is absorbed by the body more easily than nonheme iron. Heme iron is found in meat, poultry, and fish.  Nonheme iron. Nonheme iron is found in dietary supplements, iron-fortified grains, beans, and vegetables.  You may need to follow an iron-rich diet if:  You have been diagnosed with iron deficiency or iron-deficiency anemia.  You have a condition that prevents you from absorbing dietary iron, such as: ? Infection in your intestines. ? Celiac disease. This involves long-lasting (chronic) inflammation of your intestines.  You do not eat enough iron.  You eat a diet that is high in foods that impair iron absorption.  You have lost a lot of blood.  You have heavy bleeding during your menstrual cycle.  You are pregnant.  What is my plan? Your health care provider may help you to determine how much iron you need per day based on your condition. Generally, when a person consumes sufficient amounts of iron in the diet, the following iron needs are met:  Men. ? 43-42 years old: 11 mg per day. ? 31-80 years old: 8 mg per day.  Women. ? 23-55 years old: 15 mg per day. ? 62-54 years old: 18 mg per day. ? Over 18 years old: 8 mg per day. ? Pregnant women: 27 mg per day. ? Breastfeeding women: 9 mg per day.  What do I need to know about an iron-rich diet?  Eat fresh fruits and vegetables that are high in vitamin C along with foods that are high in iron. This will help  increase the amount of iron that your body absorbs from food, especially with foods containing nonheme iron. Foods that are high in vitamin C include oranges, peppers, tomatoes, and mango.  Take iron supplements only as directed by your health care provider. Overdose of iron can be life-threatening. If you were prescribed iron supplements, take them with orange juice or a vitamin C supplement.  Cook foods in pots and pans that are made from iron.  Eat nonheme iron-containing foods alongside foods that are high in heme iron. This helps to improve your iron absorption.  Certain foods and drinks contain compounds that impair iron absorption. Avoid eating these foods in the same meal as iron-rich foods or with iron supplements. These include: ? Coffee, black tea, and red wine. ? Milk, dairy products, and foods that are high in calcium. ? Beans, soybeans, and peas. ? Whole grains.  When eating foods that contain both nonheme iron and compounds that impair iron absorption, follow these tips to absorb iron better. ? Soak beans overnight before cooking. ? Soak whole grains overnight and drain them before using. ? Ferment flours before baking, such as using yeast in bread dough. What foods can I eat? Grains Iron-fortified breakfast cereal. Iron-fortified whole-wheat bread. Enriched rice. Sprouted grains. Vegetables Spinach. Potatoes with skin. Green peas. Broccoli. Red and green bell peppers. Fermented vegetables. Fruits Prunes. Raisins. Oranges. Strawberries. Mango. Grapefruit. Meats and Other Protein Sources  Beef liver. Oysters. Beef. Shrimp. [place]. Chicken. [place]. Sardines. Chickpeas. Nuts. Tofu. Beverages Tomato juice. Fresh orange juice. Prune juice. Hibiscus tea. Fortified instant breakfast shakes. Condiments Tahini. Fermented soy sauce. Sweets and Desserts Black-strap molasses. Other Wheat germ. The items listed above may not be a complete list of recommended foods or beverages.  Contact your dietitian for more options. What foods are not recommended? Grains Whole grains. Bran cereal. Bran flour. Oats. Vegetables Artichokes. Brussels sprouts. Kale. Fruits Blueberries. Raspberries. Strawberries. Figs. Meats and Other Protein Sources Soybeans. Products made from soy protein. Dairy Milk. Cream. Cheese. Yogurt. Cottage cheese. Beverages Coffee. Black tea. Red wine. Sweets and Desserts Cocoa. Chocolate. Ice cream. Other Basil. Oregano. Parsley. The items listed above may not be a complete list of foods and beverages to avoid. Contact your dietitian for more information. This information is not intended to replace advice given to you by your health care provider. Make sure you discuss any questions you have with your health care provider. Document Released: 11/14/2004 Document Revised: 10/21/2015 Document Reviewed: 10/28/2013 Elsevier Interactive Patient Education  [employer].

## 2018-02-20 DIAGNOSIS — M4120 Other idiopathic scoliosis, site unspecified: Secondary | ICD-10-CM | POA: Diagnosis not present

## 2018-02-20 DIAGNOSIS — M4325 Fusion of spine, thoracolumbar region: Secondary | ICD-10-CM | POA: Diagnosis not present

## 2018-02-21 DIAGNOSIS — M4325 Fusion of spine, thoracolumbar region: Secondary | ICD-10-CM | POA: Insufficient documentation

## 2018-03-03 DIAGNOSIS — M4325 Fusion of spine, thoracolumbar region: Secondary | ICD-10-CM | POA: Diagnosis not present

## 2018-03-03 DIAGNOSIS — M5441 Lumbago with sciatica, right side: Secondary | ICD-10-CM | POA: Diagnosis not present

## 2018-03-03 DIAGNOSIS — M4125 Other idiopathic scoliosis, thoracolumbar region: Secondary | ICD-10-CM | POA: Diagnosis not present

## 2018-03-03 DIAGNOSIS — M545 Low back pain: Secondary | ICD-10-CM | POA: Diagnosis not present

## 2018-03-07 DIAGNOSIS — M5441 Lumbago with sciatica, right side: Secondary | ICD-10-CM | POA: Diagnosis not present

## 2018-03-07 DIAGNOSIS — M4325 Fusion of spine, thoracolumbar region: Secondary | ICD-10-CM | POA: Diagnosis not present

## 2018-03-07 DIAGNOSIS — M545 Low back pain: Secondary | ICD-10-CM | POA: Diagnosis not present

## 2018-03-07 DIAGNOSIS — M4125 Other idiopathic scoliosis, thoracolumbar region: Secondary | ICD-10-CM | POA: Diagnosis not present

## 2018-03-10 DIAGNOSIS — M4125 Other idiopathic scoliosis, thoracolumbar region: Secondary | ICD-10-CM | POA: Diagnosis not present

## 2018-03-10 DIAGNOSIS — M5441 Lumbago with sciatica, right side: Secondary | ICD-10-CM | POA: Diagnosis not present

## 2018-03-10 DIAGNOSIS — M545 Low back pain: Secondary | ICD-10-CM | POA: Diagnosis not present

## 2018-03-10 DIAGNOSIS — M4325 Fusion of spine, thoracolumbar region: Secondary | ICD-10-CM | POA: Diagnosis not present

## 2018-03-19 DIAGNOSIS — M545 Low back pain: Secondary | ICD-10-CM | POA: Diagnosis not present

## 2018-03-19 DIAGNOSIS — M4325 Fusion of spine, thoracolumbar region: Secondary | ICD-10-CM | POA: Diagnosis not present

## 2018-03-19 DIAGNOSIS — M4125 Other idiopathic scoliosis, thoracolumbar region: Secondary | ICD-10-CM | POA: Diagnosis not present

## 2018-03-19 DIAGNOSIS — M5441 Lumbago with sciatica, right side: Secondary | ICD-10-CM | POA: Diagnosis not present

## 2018-03-21 DIAGNOSIS — M5441 Lumbago with sciatica, right side: Secondary | ICD-10-CM | POA: Diagnosis not present

## 2018-03-21 DIAGNOSIS — M4325 Fusion of spine, thoracolumbar region: Secondary | ICD-10-CM | POA: Diagnosis not present

## 2018-03-21 DIAGNOSIS — M4125 Other idiopathic scoliosis, thoracolumbar region: Secondary | ICD-10-CM | POA: Diagnosis not present

## 2018-03-21 DIAGNOSIS — M545 Low back pain: Secondary | ICD-10-CM | POA: Diagnosis not present

## 2018-03-24 DIAGNOSIS — M545 Low back pain: Secondary | ICD-10-CM | POA: Diagnosis not present

## 2018-03-24 DIAGNOSIS — M4325 Fusion of spine, thoracolumbar region: Secondary | ICD-10-CM | POA: Diagnosis not present

## 2018-03-24 DIAGNOSIS — M5441 Lumbago with sciatica, right side: Secondary | ICD-10-CM | POA: Diagnosis not present

## 2018-03-24 DIAGNOSIS — M4125 Other idiopathic scoliosis, thoracolumbar region: Secondary | ICD-10-CM | POA: Diagnosis not present

## 2018-03-25 NOTE — Progress Notes (Signed)
[place]  [phone] [phone]) [phone]     ID: [name] DOB: 1940-11-07  MR#: [id number]  SJG#:[id number]  Patient Care Team: [name], MD as PCP - General [name], [name], MD as Consulting Physician (Oncology) Jackolyn Confer, MD as Consulting Physician (General Surgery) Eppie Gibson, MD as Attending Physician (Radiation Oncology) Richmond Campbell, MD as Consulting Physician (Gastroenterology) Lavonna Monarch, MD as Consulting Physician (Dermatology) Delice Bison, Charlestine Massed, NP as Nurse Practitioner (Hematology and Oncology) PCP: [name], MD OTHER MD:  CHIEF COMPLAINT: Estrogen receptor positive breast cancer  CURRENT TREATMENT: Tamoxifen   BREAST CANCER HISTORY: From the original intake note:  Raneen had bilateral screening mammography with tomography scheduled routinely 08/01/2015 at the [facility]. This showed a possible mass in the left breast. Accordingly on 08/15/2015 she underwent left diagnostic mammography with tomography and ultrasonography. This found the breast density to be category be. On spot compression views and was a 0.9 cm spiculated mass in the upper outer left breast, which on ultrasound was at 2:30 o'clock 5 cm from the nipple and measured 0.8 cm. Ultrasound of the axilla was benign.  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  Her subsequent history is as detailed below  INTERVAL HISTORY: [name] returns today for follow-up of her estrogen receptor positive breast cancer. She is accompanied by her husband.  The patient continues on tamoxifen, which she is tolerating well. She is  experiencing no hot flashes and no more vaginal discharge than before she began taking the medication.  She is overdue for her annual mammography. Her last screening was on 09/24/2016 with [facility].   She had back surgery at [facility] with Dr. Skeet Latch for Idiopathic scoliosis of thoracolumbar regionIdiopathic scoliosis of thoracolumbar region. She has some pain, which she was told to expect. She went through three weeks of rehabilitation. She has pain when she first gets up, but is fine after that first initial push.   REVIEW OF SYSTEMS: [name] is doing well overall. Her husband believes that she is recovering from her surgery well. For exercise, she is participating in physical therapy where she is walking for 30 minutes and she doing prescribed exercises for about 15 minutes every day. The patient denies unusual headaches, visual changes, nausea, vomiting, or dizziness. There has been no unusual cough, phlegm production, or pleurisy. This been no change in bowel or bladder habits. The patient denies unexplained fatigue or unexplained weight loss, bleeding, rash, or fever. A detailed review of systems was otherwise noncontributory.    PAST MEDICAL HISTORY: Past Medical History:  Diagnosis Date  . Anemia   . Anxiety   . Arthritis    knees  . Atrophic vaginitis   . Cancer [facility])    breast cancer - left  . Diverticulosis   . Endometriosis   . Family history of breast cancer   . Family history of colon cancer   . GERD (gastroesophageal reflux disease)   . Headache(784.0)   . Heart murmur    as a child only  . History of kidney stones    passed stones, no surgery required  . History of radiation therapy 01/30/16- 03/02/16   Left Breast 50  Gy in 25 fractions.   . Hyperlipidemia   . Hypertension   . Hypothyroidism   . Internal hemorrhoids   . Osteopenia   . Osteoporosis   . Rheumatic fever    age 22  . Sleep apnea    " very mild" does not wear CPAP  . Thyroid disease     Hypothyroid  . Ulcer   . UTI (lower urinary tract infection)     PAST SURGICAL HISTORY: Past Surgical History:  Procedure Laterality Date  . APPENDECTOMY    . BREAST LUMPECTOMY Left    left may 2017  . BREAST LUMPECTOMY WITH RADIOACTIVE SEED AND SENTINEL LYMPH NODE BIOPSY Left 09/09/2015   Procedure: LEFT BREAST LUMPECTOMY WITH RADIOACTIVE SEED AND SENTINEL LYMPH NODE BIOPSY;  Surgeon: Jackolyn Confer, MD;  Location: [facility];  Service: General;  Laterality: Left;  . COLONOSCOPY    . DILATATION & CURETTAGE/HYSTEROSCOPY WITH MYOSURE N/A 10/09/2016   Procedure: DILATATION & CURETTAGE/HYSTEROSCOPY WITH MYOSURE;  Surgeon: Princess Bruins, MD;  Location: [facility];  Service: Gynecology;  Laterality: N/A;  requesting 7:30am in our block  requests one hour  . ESOPHAGOGASTRODUODENOSCOPY ENDOSCOPY    . IR GENERIC HISTORICAL  12/05/2015   IR CV LINE INJECTION 12/05/2015 [name], MD [facility]INTER[facility] RAD  . LAPAROSCOPIC ENDOMETRIOSIS FULGURATION  1978  . PELVIC LAPAROSCOPY    . PORTACATH PLACEMENT N/A 10/04/2015   Procedure: INSERTION PORT-A-CATH WITH ULTRASOUND;  Surgeon: Jackolyn Confer, MD;  Location: [facility];  Service: General;  Laterality: N/A;  . portacath removal    . TONSILLECTOMY    . ULNAR NERVE REPAIR  2010    FAMILY HISTORY Family History  Problem Relation Age of Onset  . Breast cancer Mother        Age 90  . Hypertension Mother   . Heart disease Father   . Stroke Father   . Colon cancer Father        dx 38s  . Breast cancer Paternal Grandmother        Age 2  . Diabetes Paternal Grandmother   . Heart disease Maternal Grandmother   The patient's father died at age 74 the patient's mother was diagnosed with breast cancer at age 54 and again at age 74, which was the year of her death. In addition a paternal grandmother was diagnosed with breast cancer in her 59s. The patient's father had colon cancer. The patient had 2 brothers, no sisters. There is no history of ovarian cancer in  the family.  GYNECOLOGIC HISTORY:  No LMP recorded. Patient is postmenopausal. Menarche age 45. The patient is GX P0. She went through menopause in her late 57s. She did not take hormone replacement. She never used oral contraceptives.  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 Her husband [name]") is retired from [employer]. They have an adopted son, [name], who is completing a second Engineer, production degree at [employer] state area he carries a diagnosis of schizophrenia, but is obviously very functional. The patient has no grandchildren. She is a Nurse, learning disability.  ADVANCED DIRECTIVES:  NOT IN PLACE   HEALTH MAINTENANCE: Social History   Tobacco Use  . Smoking status: Never Smoker  . Smokeless tobacco: Never Used  Substance Use Topics  . Alcohol use: No  . Drug use: No     Colonoscopy: 2012/[facility]   PAP: 2013?   Bone density: 2012/osteopenia   Lipid panel:  Allergies  Allergen Reactions  . Nitrofurantoin Nausea And Vomiting  GI upset  . Sulfamethoxazole-Trimethoprim Nausea And Vomiting and Other (See Comments)    GI upset    Current Outpatient Medications  Medication Sig Dispense Refill  . aspirin-acetaminophen-caffeine (EXCEDRIN MIGRAINE) 250-250-65 MG tablet Take 1 tablet by mouth daily as needed for headache.    Marland Kitchen atenolol (TENORMIN) 50 MG tablet TAKE 1 TABLET (50 MG TOTAL) BY MOUTH DAILY. 90 tablet 2  . chlorhexidine (PERIDEX) 0.12 % solution Use as directed 15 mLs in the mouth or throat 2 (two) times daily.     . hydrochlorothiazide (MICROZIDE) 12.5 MG capsule Take 1 capsule (12.5 mg total) by mouth daily. 90 capsule 3  . levothyroxine (SYNTHROID, LEVOTHROID) 50 MCG tablet TAKE 1 TABS BY MOUTH DAILY BEFORE BREAKFAST. (DOCTOR VISIT / LABS FOR FURTHER REFILLS) 90 tablet 0  . Omeprazole-Sodium Bicarbonate (ZEGERID) 20-1100 MG CAPS capsule Take 1 capsule by mouth daily before breakfast.    . simvastatin (ZOCOR) 40 MG tablet TAKE 1 TABLET BY MOUTH AT  BEDTIME 90 tablet 1  . tamoxifen (NOLVADEX) 20 MG tablet Take 20 mg by mouth daily.    . traZODone (DESYREL) 50 MG tablet TAKE 1 TABLET BY MOUTH EVERYDAY AT BEDTIME 90 tablet 0  . TURMERIC PO Take 320 mg by mouth daily.    Marland Kitchen venlafaxine XR (EFFEXOR-XR) 150 MG 24 hr capsule TAKE 1 CAPSULE BY MOUTH EVERY DAY 30 capsule 0   Current Facility-Administered Medications  Medication Dose Route Frequency Provider Last Rate Last Dose  . betamethasone acetate-betamethasone sodium phosphate (CELESTONE) injection 12 mg  12 mg Other Once Magnus Sinning, MD        OBJECTIVE: Middle-aged white woman in no acute distress Vitals:   03/26/18 1459  BP: 136/60  Pulse: 89  Resp: 18  Temp: 98.3 F (36.8 C)  SpO2: 99%     Body mass index is 28.04 kg/m.    ECOG FS:1 - Symptomatic but completely ambulatory  Sclerae unicteric, EOMs intact No cervical or supraclavicular adenopathy Lungs no rales or rhonchi Heart regular rate and rhythm Abd soft, nontender, positive bowel sounds MSK status post recent spinal surgery with the incision healed very nicely, no dehiscence, erythema, swelling, or unusual tenderness. Neuro: nonfocal, well oriented, appropriate affect Breasts: The right breast is unremarkable.  The left breast has undergone lumpectomy followed by radiation with no evidence of disease recurrence.  Both axillae are benign.  LAB RESULTS:  CMP     Component Value Date/Time   NA 140 02/10/2018 1145   NA 140 01/03/2017 1052   K 5.1 02/10/2018 1145   K 4.5 01/03/2017 1052   CL 104 02/10/2018 1145   CO2 28 02/10/2018 1145   CO2 27 01/03/2017 1052   GLUCOSE 117 (H) 02/10/2018 1145   GLUCOSE 114 01/03/2017 1052   BUN 22 02/10/2018 1145   BUN 25.5 01/03/2017 1052   CREATININE 0.85 02/10/2018 1145   CREATININE 1.1 01/03/2017 1052   CALCIUM 9.5 02/10/2018 1145   CALCIUM 10.7 (H) 01/03/2017 1052   PROT 6.7 02/10/2018 1145   PROT 7.4 01/03/2017 1052   ALBUMIN 4.3 02/10/2018 1145   ALBUMIN 4.2  01/03/2017 1052   AST 15 02/10/2018 1145   AST 20 01/03/2017 1052   ALT 13 02/10/2018 1145   ALT 17 01/03/2017 1052   ALKPHOS 137 (H) 02/10/2018 1145   ALKPHOS 60 01/03/2017 1052   BILITOT 0.4 02/10/2018 1145   BILITOT 0.47 01/03/2017 1052   GFRNONAA 59 (L) 10/02/2016 1050   GFRAA >60 10/02/2016 1050  INo results found for: SPEP, UPEP  Lab Results  Component Value Date   WBC 4.9 03/26/2018   NEUTROABS 2.7 03/26/2018   HGB 11.6 (L) 03/26/2018   HCT 37.6 03/26/2018   MCV 85.5 03/26/2018   PLT 214 03/26/2018      Chemistry      Component Value Date/Time   NA 140 02/10/2018 1145   NA 140 01/03/2017 1052   K 5.1 02/10/2018 1145   K 4.5 01/03/2017 1052   CL 104 02/10/2018 1145   CO2 28 02/10/2018 1145   CO2 27 01/03/2017 1052   BUN 22 02/10/2018 1145   BUN 25.5 01/03/2017 1052   CREATININE 0.85 02/10/2018 1145   CREATININE 1.1 01/03/2017 1052      Component Value Date/Time   CALCIUM 9.5 02/10/2018 1145   CALCIUM 10.7 (H) 01/03/2017 1052   ALKPHOS 137 (H) 02/10/2018 1145   ALKPHOS 60 01/03/2017 1052   AST 15 02/10/2018 1145   AST 20 01/03/2017 1052   ALT 13 02/10/2018 1145   ALT 17 01/03/2017 1052   BILITOT 0.4 02/10/2018 1145   BILITOT 0.47 01/03/2017 1052       No results found for: LABCA2  No components found for: LABCA125  No results for input(s): INR in the last 168 hours.  Urinalysis    Component Value Date/Time   COLORURINE YELLOW 09/23/2016 0914   APPEARANCEUR HAZY (A) 09/23/2016 0914   LABSPEC 1.018 09/23/2016 0914   PHURINE 5.0 09/23/2016 0914   GLUCOSEU NEGATIVE 09/23/2016 0914   HGBUR SMALL (A) 09/23/2016 0914   BILIRUBINUR neg 05/01/2017 1706   KETONESUR NEGATIVE 09/23/2016 0914   PROTEINUR neg 05/01/2017 1706   PROTEINUR NEGATIVE 09/23/2016 0914   UROBILINOGEN 0.2 05/01/2017 1706   NITRITE neg 05/01/2017 1706   NITRITE NEGATIVE 09/23/2016 0914   LEUKOCYTESUR Negative 05/01/2017 1706      ELIGIBLE FOR AVAILABLE RESEARCH  PROTOCOL: no  STUDIES: She is behind on mammography  ASSESSMENT: 77 y.o. [place] woman status post left breast upper outer quadrant biopsy 08/19/2015 for a clinical T1b N0, stage IA invasive ductal carcinoma, strongly estrogen and progesterone receptor positive, HER-2 not amplified, with an MIB-1 of 10%  (1) left lumpectomy and sentinel lymph node sampling 09/09/2015 showed apT1b pN0, stage IA invasive ductal carcinoma, grade 1, repeat HER-2 again negative.   (2) Oncotype DX score of 29 predicts a 19% risk of recurrence outside the breast within the next 10 years if the patient's only systemic therapy is tamoxifen for 5 years. It also predicts a further risk reduction with chemotherapy of 8%.  (3) adjuvant chemotherapy consisting of cyclophosphamide and docetaxel every 21 days 4, started 10/24/2015, completed 12/26/2015.  (4) adjuvant radiation 01/30/16 - 03/02/16 Site/dose:   Left breast: 50 Gy in 25 fractions  (5) started tamoxifen 04/16/2016  (a) bone density October 2017 shows a T score of -2.1 (osteopenia)  (6) genetics testing 09/19/2015 through the Breast/Ovarian gene panel offered by [facility] found no deleterious mutations in ATM, BARD1, BRCA1, BRCA2, BRIP1, CDH1, CHEK2, EPCAM, FANCC, MLH1, MSH2, MSH6, NBN, PALB2, PMS2, PTEN, RAD51C, RAD51D, TP53, and XRCC2.  PLAN: [name] is now 2-1/2 years out from definitive surgery for her breast cancer with no evidence of disease recurrence.  This is very favorable.  With all the stuff happening this year she is behind on mammography so I will arrange for her to have mammography this month.  Normally then I would see her in February, 2 months after her mammogram, but since I  just saw her what we will do is she will see my 44 assistant sometime in the summer and then see me February 2021 after her mammogram in December 2020  I have encouraged her to continue her exercises after she finishes the planned physical therapy.  She may be  interested in touching up her breast reconstruction.  I gave her the name of the surgeon that I like for that at [facility] where she is already going for her back issues  She knows to call for any other problems that may develop before the next visit.   [name], [name], MD  03/26/18 3:12 PM Medical Oncology and Hematology [facility] [address] [place], [place] [address] Tel. [phone]    Fax. [phone]   I, Jacqualyn Posey am acting as a Education administrator for Chauncey Cruel, MD.   I, Lurline Del MD, have reviewed the above documentation for accuracy and completeness, and I agree with the above.

## 2018-03-26 ENCOUNTER — Inpatient Hospital Stay: Payer: Medicare Other

## 2018-03-26 ENCOUNTER — Inpatient Hospital Stay: Payer: Medicare Other | Attending: Oncology | Admitting: Oncology

## 2018-03-26 VITALS — BP 136/60 | HR 89 | Temp 98.3°F | Resp 18 | Ht 60.0 in | Wt 143.6 lb

## 2018-03-26 DIAGNOSIS — M8580 Other specified disorders of bone density and structure, unspecified site: Secondary | ICD-10-CM | POA: Diagnosis not present

## 2018-03-26 DIAGNOSIS — M949 Disorder of cartilage, unspecified: Secondary | ICD-10-CM

## 2018-03-26 DIAGNOSIS — Z170 Estrogen receptor positive status [ER+]: Secondary | ICD-10-CM | POA: Insufficient documentation

## 2018-03-26 DIAGNOSIS — M899 Disorder of bone, unspecified: Secondary | ICD-10-CM

## 2018-03-26 DIAGNOSIS — C50412 Malignant neoplasm of upper-outer quadrant of left female breast: Secondary | ICD-10-CM | POA: Diagnosis not present

## 2018-03-26 LAB — CBC WITH DIFFERENTIAL (CANCER CENTER ONLY)
Abs Immature Granulocytes: 0 10*3/uL (ref 0.00–0.07)
Basophils Absolute: 0 10*3/uL (ref 0.0–0.1)
Basophils Relative: 1 %
Eosinophils Absolute: 0.2 10*3/uL (ref 0.0–0.5)
Eosinophils Relative: 5 %
HCT: 37.6 % (ref 36.0–46.0)
Hemoglobin: 11.6 g/dL — ABNORMAL LOW (ref 12.0–15.0)
Immature Granulocytes: 0 %
Lymphocytes Relative: 28 %
Lymphs Abs: 1.4 10*3/uL (ref 0.7–4.0)
MCH: 26.4 pg (ref 26.0–34.0)
MCHC: 30.9 g/dL (ref 30.0–36.0)
MCV: 85.5 fL (ref 80.0–100.0)
Monocytes Absolute: 0.6 10*3/uL (ref 0.1–1.0)
Monocytes Relative: 11 %
Neutro Abs: 2.7 10*3/uL (ref 1.7–7.7)
Neutrophils Relative %: 55 %
Platelet Count: 214 10*3/uL (ref 150–400)
RBC: 4.4 MIL/uL (ref 3.87–5.11)
RDW: 14.2 % (ref 11.5–15.5)
WBC Count: 4.9 10*3/uL (ref 4.0–10.5)
nRBC: 0 % (ref 0.0–0.2)

## 2018-03-26 LAB — CMP (CANCER CENTER ONLY)
ALT: 10 U/L (ref 0–44)
AST: 16 U/L (ref 15–41)
Albumin: 3.9 g/dL (ref 3.5–5.0)
Alkaline Phosphatase: 91 U/L (ref 38–126)
Anion gap: 11 (ref 5–15)
BUN: 17 mg/dL (ref 8–23)
CO2: 25 mmol/L (ref 22–32)
Calcium: 9.2 mg/dL (ref 8.9–10.3)
Chloride: 107 mmol/L (ref 98–111)
Creatinine: 0.83 mg/dL (ref 0.44–1.00)
GFR, Est AFR Am: >60 mL/min (ref >60)
GFR, Estimated: >60 mL/min (ref >60)
Glucose, Bld: 130 mg/dL — ABNORMAL HIGH (ref 70–99)
Potassium: 3.6 mmol/L (ref 3.5–5.1)
Sodium: 143 mmol/L (ref 135–145)
Total Bilirubin: 0.2 mg/dL — ABNORMAL LOW (ref 0.3–1.2)
Total Protein: 7 g/dL (ref 6.5–8.1)

## 2018-03-28 DIAGNOSIS — M4325 Fusion of spine, thoracolumbar region: Secondary | ICD-10-CM | POA: Diagnosis not present

## 2018-03-28 DIAGNOSIS — M545 Low back pain: Secondary | ICD-10-CM | POA: Diagnosis not present

## 2018-03-28 DIAGNOSIS — M5441 Lumbago with sciatica, right side: Secondary | ICD-10-CM | POA: Diagnosis not present

## 2018-03-28 DIAGNOSIS — M4125 Other idiopathic scoliosis, thoracolumbar region: Secondary | ICD-10-CM | POA: Diagnosis not present

## 2018-04-03 DIAGNOSIS — M4325 Fusion of spine, thoracolumbar region: Secondary | ICD-10-CM | POA: Diagnosis not present

## 2018-04-03 DIAGNOSIS — M549 Dorsalgia, unspecified: Secondary | ICD-10-CM | POA: Diagnosis not present

## 2018-04-03 DIAGNOSIS — M533 Sacrococcygeal disorders, not elsewhere classified: Secondary | ICD-10-CM | POA: Diagnosis not present

## 2018-04-03 DIAGNOSIS — Z9889 Other specified postprocedural states: Secondary | ICD-10-CM | POA: Diagnosis not present

## 2018-04-03 DIAGNOSIS — M4120 Other idiopathic scoliosis, site unspecified: Secondary | ICD-10-CM | POA: Diagnosis not present

## 2018-04-03 DIAGNOSIS — Z981 Arthrodesis status: Secondary | ICD-10-CM | POA: Diagnosis not present

## 2018-04-11 ENCOUNTER — Ambulatory Visit
Admission: RE | Admit: 2018-04-11 | Discharge: 2018-04-11 | Disposition: A | Discharge status: Home / Self Care | Payer: Medicare Other | Source: Ambulatory Visit | Attending: Oncology | Admitting: Oncology

## 2018-04-11 DIAGNOSIS — M949 Disorder of cartilage, unspecified: Secondary | ICD-10-CM

## 2018-04-11 DIAGNOSIS — R928 Other abnormal and inconclusive findings on diagnostic imaging of breast: Secondary | ICD-10-CM | POA: Diagnosis not present

## 2018-04-11 DIAGNOSIS — Z170 Estrogen receptor positive status [ER+]: Secondary | ICD-10-CM

## 2018-04-11 DIAGNOSIS — M899 Disorder of bone, unspecified: Secondary | ICD-10-CM

## 2018-04-11 DIAGNOSIS — C50412 Malignant neoplasm of upper-outer quadrant of left female breast: Secondary | ICD-10-CM

## 2018-04-11 DIAGNOSIS — Z853 Personal history of malignant neoplasm of breast: Secondary | ICD-10-CM | POA: Diagnosis not present

## 2018-04-11 HISTORY — DX: Personal history of irradiation: Z92.3

## 2018-04-11 HISTORY — DX: Personal history of antineoplastic chemotherapy: Z92.21

## 2018-04-29 ENCOUNTER — Other Ambulatory Visit: Payer: Self-pay | Admitting: Oncology

## 2018-04-29 DIAGNOSIS — C50412 Malignant neoplasm of upper-outer quadrant of left female breast: Secondary | ICD-10-CM

## 2018-04-29 DIAGNOSIS — Z170 Estrogen receptor positive status [ER+]: Secondary | ICD-10-CM

## 2018-06-03 ENCOUNTER — Other Ambulatory Visit: Payer: Self-pay | Admitting: *Deleted

## 2018-06-03 MED ORDER — TAMOXIFEN CITRATE 20 MG PO TABS: 20.0000 mg | ORAL_TABLET | Freq: Every day | ORAL | 3 refills | Status: DC

## 2018-06-23 ENCOUNTER — Telehealth: Payer: Self-pay | Admitting: Family Medicine

## 2018-06-23 ENCOUNTER — Other Ambulatory Visit: Payer: Self-pay

## 2018-06-23 MED ORDER — HYDROCHLOROTHIAZIDE 12.5 MG PO CAPS: 12.5000 mg | ORAL_CAPSULE | Freq: Every day | ORAL | 3 refills | Status: DC

## 2018-06-23 NOTE — Telephone Encounter (Signed)
Copied from [place] [id number]. Topic: Quick Communication - Rx Refill/Question >> Jun 23, 2018 10:59 AM Rayann Heman wrote: Medication: hydrochlorothiazide (MICROZIDE) 12.5 MG capsule [[id number]]   Has the patient contacted their pharmacy?  Preferred Pharmacy (with phone number or street name):[facility] [place] [facility] [place], [place] [address] [phone] (Phone) [phone] (Fax)

## 2018-06-23 NOTE — Telephone Encounter (Signed)
Medication has been filled and sent to the pharmacy requested.

## 2018-06-24 ENCOUNTER — Other Ambulatory Visit: Payer: Self-pay

## 2018-06-24 ENCOUNTER — Encounter: Payer: Self-pay | Admitting: Family Medicine

## 2018-06-24 ENCOUNTER — Ambulatory Visit (INDEPENDENT_AMBULATORY_CARE_PROVIDER_SITE_OTHER): Payer: Medicare Other | Admitting: Family Medicine

## 2018-06-24 VITALS — BP 116/64 | HR 67 | Temp 98.0°F | Ht 60.0 in | Wt 146.2 lb

## 2018-06-24 DIAGNOSIS — I10 Essential (primary) hypertension: Secondary | ICD-10-CM | POA: Diagnosis not present

## 2018-06-24 DIAGNOSIS — G43909 Migraine, unspecified, not intractable, without status migrainosus: Secondary | ICD-10-CM | POA: Diagnosis not present

## 2018-06-24 DIAGNOSIS — M7062 Trochanteric bursitis, left hip: Secondary | ICD-10-CM

## 2018-06-24 MED ORDER — VENLAFAXINE HCL ER 150 MG PO CP24: ORAL_CAPSULE | ORAL | 3 refills | Status: DC

## 2018-06-24 MED ORDER — HYDROCHLOROTHIAZIDE 12.5 MG PO CAPS: 12.5000 mg | ORAL_CAPSULE | Freq: Every day | ORAL | 3 refills | Status: DC

## 2018-06-24 NOTE — Patient Instructions (Signed)
Trochanteric Bursitis  Trochanteric bursitis is a condition that causes hip pain. Trochanteric bursitis happens when fluid-filled sacs (bursae) in the hip get irritated. Normally these sacs absorb shock and help strong bands of tissue (tendons) in your hip glide smoothly over each other and over your hip bones.  What are the causes?  This condition results from increased friction between the hip bones and the tendons that go over them. This condition can happen if you:  · Have weak hips.  · Use your hip muscles too much (overuse).  · Get hit in the hip.  What increases the risk?  This condition is more likely to develop in:  · Women.  · Adults who are middle-aged or older.  · People with arthritis or a spinal condition.  · People with weak buttocks muscles (gluteal muscles).  · People who have one leg that is shorter than the other.  · People who participate in certain kinds of athletic activities, such as:  ? Running sports, especially long-distance running.  ? Contact sports, like football or martial arts.  ? Sports in which falls may occur, like skiing.  What are the signs or symptoms?  The main symptom of this condition is pain and tenderness over the point of your hip. The pain may be:  · Sharp and intense.  · Dull and achy.  · Felt on the outside of your thigh.  It may increase when you:  · Lie on your side.  · Walk or run.  · Go up on stairs.  · Sit.  · Stand up after sitting.  · Stand for long periods of time.  How is this diagnosed?  This condition may be diagnosed based on:  · Your symptoms.  · Your medical history.  · A physical exam.  · Imaging tests, such as:  ? X-rays to check your bones.  ? An MRI or ultrasound to check your tendons and muscles.  During your physical exam, your health care provider will check the movement and strength of your hip. He or she may press on the point of your hip to check for pain.  How is this treated?  This condition may be treated by:  · Resting.  · Reducing your  activity.  · Avoiding activities that cause pain.  · Using crutches, a cane, or a walker to decrease the strain on your hip.  · Taking medicine to help with swelling.  · Having medicine injected into the bursae to help with swelling.  · Using ice, heat, and massage therapy for pain relief.  · Physical therapy exercises for strength and flexibility.  · Surgery (rare).  Follow these instructions at home:  Activity  · Rest.  · Avoid activities that cause pain.  · Return to your normal activities as told by your health care provider. Ask your health care provider what activities are safe for you.  Managing pain, stiffness, and swelling  · Take over-the-counter and prescription medicines only as told by your health care provider.  · If directed, apply heat to the injured area as told by your health care provider.  ? Place a towel between your skin and the heat source.  ? Leave the heat on for 20-30 minutes.  ? Remove the heat if your skin turns bright red. This is especially important if you are unable to feel pain, heat, or cold. You may have a greater risk of getting burned.  · If directed, apply ice to the injured area:  ?   Put ice in a plastic bag.  ? Place a towel between your skin and the bag.  ? Leave the ice on for 20 minutes, 2-3 times a day.  General instructions  · If the affected leg is one that you use for driving, ask your health care provider when it is safe to drive.  · Use crutches, a cane, or a walker as told by your health care provider.  · If one of your legs is shorter than the other, get fitted for a shoe insert.  · Lose weight if you are overweight.  How is this prevented?  · Wear supportive footwear that is appropriate for your sport.  · If you have hip pain, start any new exercise or sport slowly.  · Maintain physical fitness, including:  ? Strength.  ? Flexibility.  Contact a health care provider if:  · Your pain does not improve with 2-4 weeks.  Get help right away if:  · You develop severe  pain.  · You have a fever.  · You develop increased redness over your hip.  · You have a change in your bowel function or bladder function.  · You cannot control the muscles in your feet.  This information is not intended to replace advice given to you by your health care provider. Make sure you discuss any questions you have with your health care provider.  Document Released: 05/10/2004 Document Revised: 12/07/2015 Document Reviewed: 03/18/2015  Elsevier Interactive Patient Education © 2019 [employer]

## 2018-06-24 NOTE — Progress Notes (Signed)
Subjective:     Patient ID: [name], female   DOB: [birth year], 78 y.o.   MRN: [id number]  HPI Patient in for medication refills.  She needs HCTZ and Effexor.  She has been Effexor for several years.  She apparently at one point went on this for migraine prevention.  She has a son who has schizophrenia currently hospitalized in [place].  She is very reluctant to scale back.  She takes HCTZ for hypertension.  She had CBC and comprehensive metabolic panel through the oncology center back in December and electrolytes were stable.  Blood pressure stable.  No recent dizziness or headaches.  No chest pains.  Left lateral hip pain.  No fall.  Her pain is over the greater trochanteric bursa region.  Sore to touch.  She has not tried any heat or ice.  Pain with ambulation  Past Medical History:  Diagnosis Date  . Anemia   . Anxiety   . Arthritis    knees  . Atrophic vaginitis   . Cancer [facility])    breast cancer - left  . Diverticulosis   . Endometriosis   . Family history of breast cancer   . Family history of colon cancer   . GERD (gastroesophageal reflux disease)   . Headache(784.0)   . Heart murmur    as a child only  . History of kidney stones    passed stones, no surgery required  . History of radiation therapy 01/30/16- 03/02/16   Left Breast 50 Gy in 25 fractions.   . Hyperlipidemia   . Hypertension   . Hypothyroidism   . Internal hemorrhoids   . Osteopenia   . Osteoporosis   . Personal history of chemotherapy   . Personal history of radiation therapy   . Rheumatic fever    age 61  . Sleep apnea    " very mild" does not wear CPAP  . Thyroid disease    Hypothyroid  . Ulcer   . UTI (lower urinary tract infection)    Past Surgical History:  Procedure Laterality Date  . APPENDECTOMY    . BREAST BIOPSY Left 08/19/2015  . BREAST LUMPECTOMY Left 09/08/2015  . BREAST LUMPECTOMY WITH RADIOACTIVE SEED AND SENTINEL LYMPH NODE BIOPSY Left 09/09/2015   Procedure: LEFT  BREAST LUMPECTOMY WITH RADIOACTIVE SEED AND SENTINEL LYMPH NODE BIOPSY;  Surgeon: Jackolyn Confer, MD;  Location: [place];  Service: General;  Laterality: Left;  . COLONOSCOPY    . DILATATION & CURETTAGE/HYSTEROSCOPY WITH MYOSURE N/A 10/09/2016   Procedure: DILATATION & CURETTAGE/HYSTEROSCOPY WITH MYOSURE;  Surgeon: Princess Bruins, MD;  Location: [facility];  Service: Gynecology;  Laterality: N/A;  requesting 7:30am in our block  requests one hour  . ESOPHAGOGASTRODUODENOSCOPY ENDOSCOPY    . IR GENERIC HISTORICAL  12/05/2015   IR CV LINE INJECTION 12/05/2015 [name], MD [facility]INTER[facility] RAD  . LAPAROSCOPIC ENDOMETRIOSIS FULGURATION  1978  . PELVIC LAPAROSCOPY    . PORTACATH PLACEMENT N/A 10/04/2015   Procedure: INSERTION PORT-A-CATH WITH ULTRASOUND;  Surgeon: Jackolyn Confer, MD;  Location: [place];  Service: General;  Laterality: N/A;  . portacath removal    . TONSILLECTOMY    . ULNAR NERVE REPAIR  2010    reports that she has never smoked. She has never used smokeless tobacco. She reports that she does not drink alcohol or use drugs. family history includes Breast cancer in her mother and paternal grandmother; Colon cancer in her father; Diabetes in her paternal grandmother; Heart disease in her father  and maternal grandmother; Hypertension in her mother; Stroke in her father. Allergies  Allergen Reactions  . Nitrofurantoin Nausea And Vomiting    GI upset  . Sulfamethoxazole-Trimethoprim Nausea And Vomiting and Other (See Comments)    GI upset     Review of Systems  Constitutional: Negative for fatigue.  Eyes: Negative for visual disturbance.  Respiratory: Negative for cough, chest tightness, shortness of breath and wheezing.   Cardiovascular: Negative for chest pain, palpitations and leg swelling.  Endocrine: Negative for polydipsia and polyuria.  Neurological: Negative for dizziness, seizures, syncope, weakness, light-headedness and headaches.       Objective:   Physical  Exam Constitutional:      Appearance: She is well-developed.  Eyes:     Pupils: Pupils are equal, round, and reactive to light.  Neck:     Musculoskeletal: Neck supple.     Thyroid: No thyromegaly.     Vascular: No JVD.  Cardiovascular:     Rate and Rhythm: Normal rate and regular rhythm.     Heart sounds: No gallop.   Pulmonary:     Effort: Pulmonary effort is normal. No respiratory distress.     Breath sounds: Normal breath sounds. No wheezing or rales.  Musculoskeletal:     Comments: She has tenderness confined over the left lateral hip over the greater trochanteric bursa region  Neurological:     Mental Status: She is alert.        Assessment:     #1 hypertension stable and at goal  #2 history of migraine headaches  #3 left greater trochanteric bursitis    Plan:     -Refilled Effexor and HCTZ for 1 year -We recommend some icing 15 to 20 minutes 2-3 times daily left lateral hip.  If not improving over the next couple weeks consider follow-up for steroid injection -Routine follow-up in 6 months and sooner as needed  [name] MD [facility]

## 2018-07-01 ENCOUNTER — Other Ambulatory Visit: Payer: Self-pay | Admitting: *Deleted

## 2018-07-01 DIAGNOSIS — C50412 Malignant neoplasm of upper-outer quadrant of left female breast: Secondary | ICD-10-CM

## 2018-07-01 DIAGNOSIS — Z170 Estrogen receptor positive status [ER+]: Secondary | ICD-10-CM

## 2018-07-01 MED ORDER — TRAZODONE HCL 50 MG PO TABS: 50.0000 mg | ORAL_TABLET | Freq: Every day | ORAL | 0 refills | Status: DC

## 2018-07-03 ENCOUNTER — Other Ambulatory Visit: Payer: Self-pay

## 2018-07-03 DIAGNOSIS — C50412 Malignant neoplasm of upper-outer quadrant of left female breast: Secondary | ICD-10-CM

## 2018-07-03 DIAGNOSIS — Z170 Estrogen receptor positive status [ER+]: Secondary | ICD-10-CM

## 2018-07-03 MED ORDER — TRAZODONE HCL 50 MG PO TABS: 50.0000 mg | ORAL_TABLET | Freq: Every day | ORAL | 0 refills | Status: DC

## 2018-07-04 ENCOUNTER — Other Ambulatory Visit: Payer: Self-pay

## 2018-07-04 MED ORDER — LEVOTHYROXINE SODIUM 50 MCG PO TABS: 50.0000 ug | ORAL_TABLET | Freq: Every day | ORAL | 1 refills | Status: DC

## 2018-07-23 ENCOUNTER — Other Ambulatory Visit: Payer: Self-pay | Admitting: Oncology

## 2018-07-23 DIAGNOSIS — C50412 Malignant neoplasm of upper-outer quadrant of left female breast: Secondary | ICD-10-CM

## 2018-07-23 DIAGNOSIS — Z170 Estrogen receptor positive status [ER+]: Secondary | ICD-10-CM

## 2018-07-24 ENCOUNTER — Other Ambulatory Visit: Payer: Self-pay | Admitting: Family Medicine

## 2018-07-24 MED ORDER — ATENOLOL 50 MG PO TABS: ORAL_TABLET | ORAL | 1 refills | Status: DC

## 2018-07-24 NOTE — Telephone Encounter (Signed)
Requested medication (s) are due for refill today: yes  Requested medication (s) are on the active medication list: yes  Last refill:  02/22/16 expired  Future visit scheduled:no  Notes to clinic:  Expired     Requested Prescriptions  Pending Prescriptions Disp Refills   atenolol (TENORMIN) 50 MG tablet 90 tablet 2    Sig: TAKE 1 TABLET (50 MG TOTAL) BY MOUTH DAILY.     Cardiovascular:  Beta Blockers Passed - 07/24/2018  1:28 PM      Passed - Last BP in normal range    BP Readings from Last 1 Encounters:  06/24/18 116/64         Passed - Last Heart Rate in normal range    Pulse Readings from Last 1 Encounters:  06/24/18 67         Passed - Valid encounter within last 6 months    Recent Outpatient Visits          1 month ago Migraine without status migrainosus, not intractable, unspecified migraine type   Therapist, music at [facility], Alinda Sierras, MD   5 months ago Other specified hypothyroidism   Therapist, music at [facility], Alinda Sierras, MD   1 year ago Left flank pain, chronic   Therapist, music at [facility], Alinda Sierras, MD   1 year ago Flank pain   Therapist, music at [facility], Alinda Sierras, MD   1 year ago Abdominal pain, [place] at [facility], Alinda Sierras, MD

## 2018-08-21 ENCOUNTER — Telehealth: Payer: Self-pay | Admitting: Family Medicine

## 2018-08-21 NOTE — Telephone Encounter (Unsigned)
Copied from [place] [phone]. Topic: Quick Communication - Rx Refill/Question >> Aug 21, 2018  4:21 PM Alanda Slim E wrote: Medication: simvastatin (ZOCOR) 40 MG tablet  Has the patient contacted their pharmacy? Yes - call PCP office   Preferred Pharmacy (with phone number or street name):   Agent: Please be advised that RX refills may take up to 3 business days. We ask that you follow-up with your pharmacy.

## 2018-08-22 ENCOUNTER — Other Ambulatory Visit: Payer: Self-pay

## 2018-08-22 MED ORDER — SIMVASTATIN 40 MG PO TABS: ORAL_TABLET | ORAL | 1 refills | Status: DC

## 2018-08-22 NOTE — Telephone Encounter (Signed)
Refill has been sent to her pharmacy

## 2018-11-03 ENCOUNTER — Telehealth: Payer: Self-pay | Admitting: Adult Health

## 2018-11-03 NOTE — Telephone Encounter (Signed)
I talk with patient regarding schedule  

## 2018-11-06 ENCOUNTER — Telehealth: Payer: Self-pay

## 2018-11-06 NOTE — Telephone Encounter (Signed)
A prescription refill request was sent for this patient for trazodone.  Nurse spoke with patient to inquire about if she is tolerating well and if she feels like this med is helping with sleep.  Patient agreed that she is fine on this med and is helping her sleep.  There are still some nights that she doesn't fall asleep quickly.  NP will send over script to patient pharmacy.

## 2018-11-27 ENCOUNTER — Ambulatory Visit: Payer: Medicare Other | Admitting: Adult Health

## 2018-11-27 ENCOUNTER — Telehealth: Payer: Self-pay | Admitting: Physician Assistant

## 2018-11-27 ENCOUNTER — Other Ambulatory Visit: Payer: Self-pay | Admitting: Oncology

## 2018-11-27 ENCOUNTER — Inpatient Hospital Stay: Payer: Medicare Other | Attending: Adult Health | Admitting: Physician Assistant

## 2018-11-27 ENCOUNTER — Other Ambulatory Visit: Payer: Self-pay

## 2018-11-27 VITALS — BP 145/59 | HR 72 | Temp 98.0°F | Resp 20 | Ht 60.0 in | Wt 151.1 lb

## 2018-11-27 DIAGNOSIS — Z170 Estrogen receptor positive status [ER+]: Secondary | ICD-10-CM | POA: Diagnosis not present

## 2018-11-27 DIAGNOSIS — Z79810 Long term (current) use of selective estrogen receptor modulators (SERMs): Secondary | ICD-10-CM | POA: Diagnosis not present

## 2018-11-27 DIAGNOSIS — F419 Anxiety disorder, unspecified: Secondary | ICD-10-CM | POA: Insufficient documentation

## 2018-11-27 DIAGNOSIS — M8580 Other specified disorders of bone density and structure, unspecified site: Secondary | ICD-10-CM | POA: Insufficient documentation

## 2018-11-27 DIAGNOSIS — C50412 Malignant neoplasm of upper-outer quadrant of left female breast: Secondary | ICD-10-CM | POA: Insufficient documentation

## 2018-11-27 DIAGNOSIS — Z7982 Long term (current) use of aspirin: Secondary | ICD-10-CM | POA: Insufficient documentation

## 2018-11-27 DIAGNOSIS — Z803 Family history of malignant neoplasm of breast: Secondary | ICD-10-CM | POA: Insufficient documentation

## 2018-11-27 DIAGNOSIS — Z79899 Other long term (current) drug therapy: Secondary | ICD-10-CM | POA: Diagnosis not present

## 2018-11-27 NOTE — Patient Instructions (Signed)
-(  [phone] [place]

## 2018-11-27 NOTE — Telephone Encounter (Signed)
Appt already scheduled per 8/13 los -

## 2018-11-27 NOTE — Progress Notes (Signed)
[place]  [phone] [phone]) [phone]     ID: [name] DOB: 1940/06/01  MR#: [id number]  FKC#:[id number]  Patient Care Team: [name], MD as PCP - General [name], [name], MD as Consulting Physician (Oncology) Jackolyn Confer, MD as Consulting Physician (General Surgery) Eppie Gibson, MD as Attending Physician (Radiation Oncology) Richmond Campbell, MD as Consulting Physician (Gastroenterology) Lavonna Monarch, MD as Consulting Physician (Dermatology) Delice Bison, Charlestine Massed, NP as Nurse Practitioner (Hematology and Oncology) PCP: [name], MD OTHER MD:  CHIEF COMPLAINT: Estrogen receptor positive breast cancer  CURRENT TREATMENT: Tamoxifen   BREAST CANCER HISTORY: From the original intake note:  [name] had bilateral screening mammography with tomography scheduled routinely 08/01/2015 at the [facility]. This showed a possible mass in the left breast. Accordingly on 08/15/2015 she underwent left diagnostic mammography with tomography and ultrasonography. This found the breast density to be category be. On spot compression views and was a 0.9 cm spiculated mass in the upper outer left breast, which on ultrasound was at 2:78 o'clock 5 cm from the nipple and measured 0.8 cm. Ultrasound of the axilla was benign.  Biopsy of the left breast mass in question 08/19/2015 found ([id number]) an invasive ductal carcinoma (E-cadherin positive) which was estrogen receptor 100% positive, progesterone receptor 100% positive, both with strong staining intensity, with an MIB-1 of 10%, and no HER-2 amplification, the signals ratio being 1.23 and the number per cell 1.85.  Her subsequent history is as detailed below  INTERVAL HISTORY: [name] returns today for follow-up of her estrogen receptor positive breast cancer. She is accompanied by her husband.  The patient continues on tamoxifen, which she is tolerating  well. She denies any hot flashes or vaginal discharge.   Her last screening mammography was performed in December 2019.  She follows closely with her primary doctor to ensure that her other health screenings are performed such as her bone density scan.s   REVIEW OF SYSTEMS: Kenlyn is doing well overall. The patient is walking for exercise. The patient denies unusual headaches, nausea, vomiting, or dizziness. She notes some visual changes in her left eye which are related to cataracts. She is following up closely regarding this issue with her eye specialist. She denies any other visual changes.  There has been no unusual cough, phlegm production, or pleurisy. This been no change in bowel or bladder habits. The patient denies unexplained fatigue or unexplained weight loss, bleeding, rash, or fever. A detailed review of systems was otherwise noncontributory.  MEDICAL HISTORY: Past Medical History:  Diagnosis Date  . Anemia   . Anxiety   . Arthritis    knees  . Atrophic vaginitis   . Cancer [facility])    breast cancer - left  . Diverticulosis   . Endometriosis   . Family history of breast cancer   . Family history of colon cancer   . GERD (gastroesophageal reflux disease)   . Headache(784.0)   . Heart murmur    as a child only  . History of kidney stones    passed stones, no surgery required  . History of radiation therapy 01/30/16- 03/02/16   Left Breast 50 Gy in 25 fractions.   . Hyperlipidemia   . Hypertension   . Hypothyroidism   . Internal hemorrhoids   . Osteopenia   . Osteoporosis   . Personal history of chemotherapy   . Personal history of radiation therapy   . Rheumatic fever    age  78  . Sleep apnea    " very mild" does not wear CPAP  . Thyroid disease    Hypothyroid  . Ulcer   . UTI (lower urinary tract infection)     The patient's father died at age 78 the patient's mother was diagnosed with breast cancer at age 37 and again at age 78, which was the year of her  death. In addition a paternal grandmother was diagnosed with breast cancer in her 52s. The patient's father had colon cancer. The patient had 2 brothers, no sisters. There is no history of ovarian cancer in the family.  GYNECOLOGIC HISTORY:  No LMP recorded. Patient is postmenopausal. Menarche age 48. The patient is GX P0. She went through menopause in her late 88s. She did not take hormone replacement. She never used oral contraceptives.  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 Her husband [name]") is retired from [employer]. They have an adopted son, [name], who is completing a second Engineer, production degree at [employer] state area he carries a diagnosis of schizophrenia, but is obviously very    ALLERGIES:  is allergic to nitrofurantoin and sulfamethoxazole-trimethoprim.  MEDICATIONS:  Current Outpatient Medications  Medication Sig Dispense Refill  . aspirin-acetaminophen-caffeine (EXCEDRIN MIGRAINE) 250-250-65 MG tablet Take 1 tablet by mouth daily as needed for headache.    Marland Kitchen atenolol (TENORMIN) 50 MG tablet TAKE 1 TABLET (50 MG TOTAL) BY MOUTH DAILY. 90 tablet 1  . chlorhexidine (PERIDEX) 0.12 % solution Use as directed 15 mLs in the mouth or throat 2 (two) times daily.     . hydrochlorothiazide (MICROZIDE) 12.5 MG capsule Take 1 capsule (12.5 mg total) by mouth daily. 90 capsule 3  . levothyroxine (SYNTHROID, LEVOTHROID) 50 MCG tablet Take 1 tablet (50 mcg total) by mouth daily before breakfast. 90 tablet 1  . Melatonin 3 MG TBDP Take by mouth.    Earney Navy Bicarbonate (ZEGERID) 20-1100 MG CAPS capsule Take 1 capsule by mouth daily before breakfast.    . simvastatin (ZOCOR) 40 MG tablet TAKE 1 TABLET BY MOUTH AT BEDTIME 90 tablet 1  . tamoxifen (NOLVADEX) 20 MG tablet Take 1 tablet (20 mg total) by mouth daily. 90 tablet 3  . traZODone (DESYREL) 50 MG tablet TAKE 1 TABLET BY MOUTH EVERYDAY AT BEDTIME 90 tablet 0  . TURMERIC PO Take 320 mg by  mouth daily.    Marland Kitchen venlafaxine XR (EFFEXOR-XR) 150 MG 24 hr capsule TAKE 1 CAPSULE BY MOUTH EVERY DAY 90 capsule 3   Current Facility-Administered Medications  Medication Dose Route Frequency Provider Last Rate Last Dose  . betamethasone acetate-betamethasone sodium phosphate (CELESTONE) injection 12 mg  12 mg Other Once Magnus Sinning, MD        SURGICAL HISTORY:  Past Surgical History:  Procedure Laterality Date  . APPENDECTOMY    . BREAST BIOPSY Left 08/19/2015  . BREAST LUMPECTOMY Left 09/08/2015  . BREAST LUMPECTOMY WITH RADIOACTIVE SEED AND SENTINEL LYMPH NODE BIOPSY Left 09/09/2015   Procedure: LEFT BREAST LUMPECTOMY WITH RADIOACTIVE SEED AND SENTINEL LYMPH NODE BIOPSY;  Surgeon: Jackolyn Confer, MD;  Location: [place];  Service: General;  Laterality: Left;  . COLONOSCOPY    . DILATATION & CURETTAGE/HYSTEROSCOPY WITH MYOSURE N/A 10/09/2016   Procedure: DILATATION & CURETTAGE/HYSTEROSCOPY WITH MYOSURE;  Surgeon: Princess Bruins, MD;  Location: [facility];  Service: Gynecology;  Laterality: N/A;  requesting 7:30am in our block  requests one hour  . ESOPHAGOGASTRODUODENOSCOPY ENDOSCOPY    . IR GENERIC HISTORICAL  12/05/2015   IR CV LINE INJECTION 12/05/2015 [name], MD [facility]INTER[facility] RAD  . LAPAROSCOPIC ENDOMETRIOSIS FULGURATION  1978  . PELVIC LAPAROSCOPY    . PORTACATH PLACEMENT N/A 10/04/2015   Procedure: INSERTION PORT-A-CATH WITH ULTRASOUND;  Surgeon: Jackolyn Confer, MD;  Location: [place];  Service: General;  Laterality: N/A;  . portacath removal    . TONSILLECTOMY    . ULNAR NERVE REPAIR  2010   PHYSICAL EXAMINATION:  Blood pressure (!) 145/59, pulse 72, temperature 98 F (36.7 C), resp. rate 20, height 5' (1.524 m), weight 151 lb 1.6 oz (68.5 kg), SpO2 97 %.  ECOG PERFORMANCE STATUS: 1 - Symptomatic but completely ambulatory  Physical Exam  Constitutional: Oriented to person, place, and time and well-developed, well-nourished, and in no distress. HENT:  Head: Normocephalic  and atraumatic.  Mouth/Throat: Oropharynx is clear and moist. No oropharyngeal exudate.  Eyes: Conjunctivae are normal. Right eye exhibits no discharge. Left eye exhibits no discharge. No scleral icterus.  Neck: Normal range of motion. Neck supple.  Cardiovascular: Normal rate, regular rhythm, normal heart sounds and intact distal pulses.   Pulmonary/Chest: Effort normal and breath sounds normal. No respiratory distress. No wheezes. No rales.  Abdominal: Soft. Bowel sounds are normal. Exhibits no distension and no mass. There is no tenderness.  Breasts: The right breast is unremarkable.  The left breast has undergone lumpectomy followed by radiation with no evidence of disease recurrence Musculoskeletal: Normal range of motion. Exhibits no edema.  Lymphadenopathy:    No cervical adenopathy.  Neurological: Alert and oriented to person, place, and time. Exhibits normal muscle tone. Gait normal. Coordination normal.  Skin: Skin is warm and dry. No rash noted. Not diaphoretic. No erythema. No pallor.  Psychiatric: Mood, memory and judgment normal.  Vitals reviewed.  LABORATORY DATA: Lab Results  Component Value Date   WBC 4.9 03/26/2018   HGB 11.6 (L) 03/26/2018   HCT 37.6 03/26/2018   MCV 85.5 03/26/2018   PLT 214 03/26/2018      Chemistry      Component Value Date/Time   NA 143 03/26/2018 1442   NA 140 01/03/2017 1052   K 3.6 03/26/2018 1442   K 4.5 01/03/2017 1052   CL 107 03/26/2018 1442   CO2 25 03/26/2018 1442   CO2 27 01/03/2017 1052   BUN 17 03/26/2018 1442   BUN 25.5 01/03/2017 1052   CREATININE 0.83 03/26/2018 1442   CREATININE 1.1 01/03/2017 1052      Component Value Date/Time   CALCIUM 9.2 03/26/2018 1442   CALCIUM 10.7 (H) 01/03/2017 1052   ALKPHOS 91 03/26/2018 1442   ALKPHOS 60 01/03/2017 1052   AST 16 03/26/2018 1442   AST 20 01/03/2017 1052   ALT 10 03/26/2018 1442   ALT 17 01/03/2017 1052   BILITOT 0.2 (L) 03/26/2018 1442   BILITOT 0.47 01/03/2017 1052        RADIOGRAPHIC STUDIES:  No results found.   ASSESSMENT/PLAN:  ELIGIBLE FOR AVAILABLE RESEARCH PROTOCOL: no  STUDIES: She is behind on mammography  ASSESSMENT: 78 y.o. [place] woman status post left breast upper outer quadrant biopsy 08/19/2015 for a clinical T1b N0, stage IA invasive ductal carcinoma, strongly estrogen and progesterone receptor positive, HER-2 not amplified, with an MIB-1 of 10%  (1) left lumpectomy and sentinel lymph node sampling 09/09/2015 showed apT1b pN0, stage IA invasive ductal carcinoma, grade 1, repeat HER-2 again negative.   (2) Oncotype DX score of 29 predicts a 19% risk of recurrence outside the breast  within the next 10 years if the patient's only systemic therapy is tamoxifen for 5 years. It also predicts a further risk reduction with chemotherapy of 8%.  (3) adjuvant chemotherapy consisting of cyclophosphamide and docetaxel every 21 days 4, started 10/24/2015, completed 12/26/2015.  (4) adjuvant radiation 01/30/16 - 03/02/16 Site/dose:Left breast: 50 Gy in 25 fractions  (5) started tamoxifen 04/16/2016             (a) bone density October 2017 shows a T score of -2.1 (osteopenia)  (6) genetics testing 09/19/2015 through the Breast/Ovarian gene panel offered by [facility] found no deleterious mutations in ATM, BARD1, BRCA1, BRCA2, BRIP1, CDH1, CHEK2, EPCAM, FANCC, MLH1, MSH2, MSH6, NBN, PALB2, PMS2, PTEN, RAD51C, RAD51D, TP53, and XRCC2.  PLAN: Renna is now over 3 years out from definitive surgery for her breast cancer with no evidence of disease recurrence.   The patient is up to date with her annual mammogram. She is due for a repeat mammogram in December 2020. I will place an order for her to have this performed at [facility]. The number to this location was also provided to the patient and her husband on her AVS. I also encouraged the patient to perform self breast exams.   Per Dr. [name] last  note, the patient will see him back in February 2021 after her mammogram in December 2020. This next follow up appointment is already scheduled.   I have encouraged her to continue her exercises.  The patient was advised to call immediately if she has any concerning symptoms in the interval. The patient voices understanding of current disease status and treatment options and is in agreement with the current care plan. All questions were answered. The patient knows to call the clinic with any problems, questions or concerns. We can certainly see the patient much sooner if necessary   Orders Placed This Encounter  Procedures  . MM Digital Diagnostic Bilat    Please schedule for December 2020    Standing Status:   Future    Standing Expiration Date:   11/27/2019    Scheduling Instructions:     Please schedule for December 2020    Order Specific Question:   Reason for Exam (SYMPTOM  OR DIAGNOSIS REQUIRED)    Answer:   Breast Cancer    Order Specific Question:   Preferred imaging location?    Answer:   [facility]  . CBC with Differential (Cancer Center Only)    Standing Status:   Future    Standing Expiration Date:   11/27/2019  . CMP ([place] only)    Standing Status:   Future    Standing Expiration Date:   11/27/2019     Allessandra Bernardi L Kaylea Mounsey, PA-C 11/27/18

## 2018-12-24 ENCOUNTER — Other Ambulatory Visit: Payer: Self-pay | Admitting: Family Medicine

## 2019-01-02 ENCOUNTER — Other Ambulatory Visit: Payer: Self-pay | Admitting: Family Medicine

## 2019-01-05 ENCOUNTER — Ambulatory Visit: Payer: Medicare Other | Admitting: Adult Health

## 2019-01-08 ENCOUNTER — Other Ambulatory Visit: Payer: Self-pay | Admitting: Family Medicine

## 2019-01-22 ENCOUNTER — Other Ambulatory Visit: Payer: Self-pay | Admitting: Family Medicine

## 2019-02-06 ENCOUNTER — Other Ambulatory Visit: Payer: Self-pay | Admitting: Family Medicine

## 2019-02-06 ENCOUNTER — Other Ambulatory Visit: Payer: Self-pay | Admitting: Oncology

## 2019-02-06 DIAGNOSIS — Z170 Estrogen receptor positive status [ER+]: Secondary | ICD-10-CM

## 2019-02-06 DIAGNOSIS — C50412 Malignant neoplasm of upper-outer quadrant of left female breast: Secondary | ICD-10-CM

## 2019-02-13 DIAGNOSIS — H2513 Age-related nuclear cataract, bilateral: Secondary | ICD-10-CM | POA: Diagnosis not present

## 2019-02-13 DIAGNOSIS — H35372 Puckering of macula, left eye: Secondary | ICD-10-CM | POA: Diagnosis not present

## 2019-03-24 ENCOUNTER — Telehealth: Payer: Self-pay | Admitting: *Deleted

## 2019-03-24 ENCOUNTER — Other Ambulatory Visit: Payer: Self-pay | Admitting: Physician Assistant

## 2019-03-24 DIAGNOSIS — Z170 Estrogen receptor positive status [ER+]: Secondary | ICD-10-CM

## 2019-03-24 DIAGNOSIS — C50412 Malignant neoplasm of upper-outer quadrant of left female breast: Secondary | ICD-10-CM

## 2019-03-24 NOTE — Telephone Encounter (Signed)
This RN spoke with pt per her call regarding ongoing pain in right breast but had 2 day episode of more severe pain causing concern.  She called to get her mammogram scheduled for later this month rescheduled to sooner date but was informed to contact this office first.  Of note pain is at the nipple and goes down to the 6 oclock position.  This RN contacted the [facility] and informed of above.   Note placed per above as well as order for [place] if needed.  Scheduler will call patient to reschedule appointment.  No further needs at this time.

## 2019-03-25 ENCOUNTER — Ambulatory Visit
Admission: RE | Admit: 2019-03-25 | Discharge: 2019-03-25 | Disposition: A | Discharge status: Home / Self Care | Payer: Medicare Other | Source: Ambulatory Visit | Attending: Physician Assistant | Admitting: Physician Assistant

## 2019-03-25 ENCOUNTER — Other Ambulatory Visit: Payer: Self-pay

## 2019-03-25 DIAGNOSIS — Z170 Estrogen receptor positive status [ER+]: Secondary | ICD-10-CM

## 2019-03-25 DIAGNOSIS — N6001 Solitary cyst of right breast: Secondary | ICD-10-CM | POA: Diagnosis not present

## 2019-03-25 DIAGNOSIS — C50412 Malignant neoplasm of upper-outer quadrant of left female breast: Secondary | ICD-10-CM

## 2019-03-25 DIAGNOSIS — R928 Other abnormal and inconclusive findings on diagnostic imaging of breast: Secondary | ICD-10-CM | POA: Diagnosis not present

## 2019-03-25 DIAGNOSIS — Z853 Personal history of malignant neoplasm of breast: Secondary | ICD-10-CM | POA: Diagnosis not present

## 2019-04-06 ENCOUNTER — Other Ambulatory Visit: Payer: Self-pay | Admitting: Family Medicine

## 2019-04-13 ENCOUNTER — Other Ambulatory Visit: Payer: Self-pay | Admitting: Family Medicine

## 2019-04-13 DIAGNOSIS — Z1231 Encounter for screening mammogram for malignant neoplasm of breast: Secondary | ICD-10-CM

## 2019-04-22 ENCOUNTER — Other Ambulatory Visit: Payer: Self-pay | Admitting: Family Medicine

## 2019-05-06 ENCOUNTER — Other Ambulatory Visit: Payer: Self-pay | Admitting: Family Medicine

## 2019-05-06 ENCOUNTER — Other Ambulatory Visit: Payer: Self-pay | Admitting: Oncology

## 2019-05-06 DIAGNOSIS — C50412 Malignant neoplasm of upper-outer quadrant of left female breast: Secondary | ICD-10-CM

## 2019-05-06 DIAGNOSIS — Z170 Estrogen receptor positive status [ER+]: Secondary | ICD-10-CM

## 2019-05-08 NOTE — Telephone Encounter (Signed)
Okay to refill Effexor?

## 2019-05-09 NOTE — Telephone Encounter (Signed)
Refill for 3 months.  She needs follow up in 3 months.

## 2019-05-15 ENCOUNTER — Ambulatory Visit: Payer: Medicare Other

## 2019-05-20 ENCOUNTER — Other Ambulatory Visit: Payer: Self-pay

## 2019-05-20 ENCOUNTER — Telehealth (INDEPENDENT_AMBULATORY_CARE_PROVIDER_SITE_OTHER): Payer: Medicare Other | Admitting: Family Medicine

## 2019-05-20 DIAGNOSIS — H8112 Benign paroxysmal vertigo, left ear: Secondary | ICD-10-CM | POA: Diagnosis not present

## 2019-05-20 NOTE — Progress Notes (Signed)
This visit type was conducted due to national recommendations for restrictions regarding the COVID-19 pandemic in an effort to limit this patient's exposure and mitigate transmission in our community.   Virtual Visit via Telephone Note  I connected with [name] on 05/20/19 at  3:00 PM EST by telephone and verified that I am speaking with the correct person using two identifiers.   I discussed the limitations, risks, security and privacy concerns of performing an evaluation and management service by telephone and the availability of in person appointments. I also discussed with the patient that there may be a patient responsible charge related to this service. The patient expressed understanding and agreed to proceed.  Location patient: home Location provider: work or home office Participants present for the call: patient, provider Patient did not have a visit in the prior 7 days to address this/these issue(s).   History of Present Illness: [name] called with dizziness onset this past weekend.  She is describing vertigo symptoms that are quite intense especially early in the mornings when she first rolls over in bed.  They have persisted somewhat through the day but tend to get slightly better through the day.  She has had some associated nausea but no vomiting.  She denies any headaches or focal weakness.  No ataxia.  No dysphagia.  No slurred speech.  No diplopia.  No hearing changes.  She states she has had vertigo in the past but these symptoms are somewhat more severe.  No alleviating factors.  Exacerbated by movement  Past Medical History:  Diagnosis Date  . Anemia   . Anxiety   . Arthritis    knees  . Atrophic vaginitis   . Cancer [facility])    breast cancer - left  . Diverticulosis   . Endometriosis   . Family history of breast cancer   . Family history of colon cancer   . GERD (gastroesophageal reflux disease)   . Headache(784.0)   . Heart murmur    as a child only  .  History of kidney stones    passed stones, no surgery required  . History of radiation therapy 01/30/16- 03/02/16   Left Breast 50 Gy in 25 fractions.   . Hyperlipidemia   . Hypertension   . Hypothyroidism   . Internal hemorrhoids   . Osteopenia   . Osteoporosis   . Personal history of chemotherapy   . Personal history of radiation therapy   . Rheumatic fever    age 5  . Sleep apnea    " very mild" does not wear CPAP  . Thyroid disease    Hypothyroid  . Ulcer   . UTI (lower urinary tract infection)    Past Surgical History:  Procedure Laterality Date  . APPENDECTOMY    . BREAST BIOPSY Left 08/19/2015  . BREAST LUMPECTOMY Left 09/08/2015  . BREAST LUMPECTOMY WITH RADIOACTIVE SEED AND SENTINEL LYMPH NODE BIOPSY Left 09/09/2015   Procedure: LEFT BREAST LUMPECTOMY WITH RADIOACTIVE SEED AND SENTINEL LYMPH NODE BIOPSY;  Surgeon: Jackolyn Confer, MD;  Location: [place];  Service: General;  Laterality: Left;  . COLONOSCOPY    . DILATATION & CURETTAGE/HYSTEROSCOPY WITH MYOSURE N/A 10/09/2016   Procedure: DILATATION & CURETTAGE/HYSTEROSCOPY WITH MYOSURE;  Surgeon: Princess Bruins, MD;  Location: [place] [facility];  Service: Gynecology;  Laterality: N/A;  requesting 7:30am in our block  requests one hour  . ESOPHAGOGASTRODUODENOSCOPY ENDOSCOPY    . IR GENERIC HISTORICAL  12/05/2015   IR CV LINE INJECTION 12/05/2015 [name],  MD [facility]INTER[facility] RAD  . LAPAROSCOPIC ENDOMETRIOSIS FULGURATION  1978  . PELVIC LAPAROSCOPY    . PORTACATH PLACEMENT N/A 10/04/2015   Procedure: INSERTION PORT-A-CATH WITH ULTRASOUND;  Surgeon: Jackolyn Confer, MD;  Location: [place];  Service: General;  Laterality: N/A;  . portacath removal    . TONSILLECTOMY    . ULNAR NERVE REPAIR  2010    reports that she has never smoked. She has never used smokeless tobacco. She reports that she does not drink alcohol or use drugs. family history includes Breast cancer in her mother and paternal grandmother; Colon cancer in her father;  Diabetes in her paternal grandmother; Heart disease in her father and maternal grandmother; Hypertension in her mother; Stroke in her father. Allergies  Allergen Reactions  . Nitrofurantoin Nausea And Vomiting    GI upset  . Sulfamethoxazole-Trimethoprim Nausea And Vomiting and Other (See Comments)    GI upset      Observations/Objective: Patient sounds cheerful and well on the phone. I do not appreciate any SOB. Speech and thought processing are grossly intact. Patient reported vitals:  Assessment and Plan:  Vertigo with symptoms triggered to the left.  Suspect benign peripheral positional vertigo by history  -We recommend office follow-up if symptoms not promptly resolving with Epley maneuvers. -She will try Epley maneuver several times daily and be in touch if not resolving over the next couple of days -We reviewed red flags of more worrisome vertigo to watch out for  Follow Up Instructions:  -As above   [id number] 5-10 [id number] 11-20 [id number] 21-30 I did not refer this patient for an OV in the next 24 hours for this/these issue(s).  I discussed the assessment and treatment plan with the patient. The patient was provided an opportunity to ask questions and all were answered. The patient agreed with the plan and demonstrated an understanding of the instructions.   The patient was advised to call back or seek an in-person evaluation if the symptoms worsen or if the condition fails to improve as anticipated.  I provided 23 minutes of non-face-to-face time during this encounter.   [name], MD

## 2019-05-22 ENCOUNTER — Telehealth: Payer: Self-pay | Admitting: Family Medicine

## 2019-05-22 NOTE — Telephone Encounter (Signed)
FYI

## 2019-05-22 NOTE — Telephone Encounter (Signed)
Pt call and stated after taking  benadryl all symptoms went away

## 2019-05-23 ENCOUNTER — Ambulatory Visit: Payer: Medicare Other

## 2019-05-24 ENCOUNTER — Ambulatory Visit: Payer: Medicare Other | Attending: Internal Medicine

## 2019-05-24 DIAGNOSIS — Z23 Encounter for immunization: Secondary | ICD-10-CM | POA: Insufficient documentation

## 2019-05-24 NOTE — Progress Notes (Signed)
   Covid-19 Vaccination Clinic  Name:  [name]    MRN: [id number] DOB: 02-Jun-1940  05/24/2019  [name] was observed post Covid-19 immunization for 15 minutes without incidence. She was provided with Vaccine Information Sheet and instruction to access the V-Safe system.   [name] was instructed to call 911 with any severe reactions post vaccine: Marland Kitchen Difficulty breathing  . Swelling of your face and throat  . A fast heartbeat  . A bad rash all over your body  . Dizziness and weakness    Immunizations Administered    Name Date Dose VIS Date Route   Pfizer COVID-19 Vaccine 05/24/2019 10:06 AM 0.3 mL 03/27/2019 Intramuscular   Manufacturer: [place]   Lot: [id number]   [place]: [id number]

## 2019-05-25 ENCOUNTER — Other Ambulatory Visit: Payer: Self-pay

## 2019-05-25 ENCOUNTER — Ambulatory Visit
Admission: RE | Admit: 2019-05-25 | Discharge: 2019-05-25 | Disposition: A | Discharge status: Home / Self Care | Payer: Medicare Other | Source: Ambulatory Visit | Attending: Family Medicine | Admitting: Family Medicine

## 2019-05-25 ENCOUNTER — Other Ambulatory Visit: Payer: Self-pay | Admitting: Family Medicine

## 2019-05-25 DIAGNOSIS — Z853 Personal history of malignant neoplasm of breast: Secondary | ICD-10-CM

## 2019-05-25 DIAGNOSIS — R928 Other abnormal and inconclusive findings on diagnostic imaging of breast: Secondary | ICD-10-CM | POA: Diagnosis not present

## 2019-05-25 DIAGNOSIS — Z1231 Encounter for screening mammogram for malignant neoplasm of breast: Secondary | ICD-10-CM

## 2019-05-28 ENCOUNTER — Ambulatory Visit: Payer: Medicare Other | Admitting: Oncology

## 2019-05-28 ENCOUNTER — Other Ambulatory Visit: Payer: Medicare Other

## 2019-05-31 NOTE — Progress Notes (Signed)
[name]  [phone] [phone]     ID: [name] DOB: 05/29/1940  MR#: [id number]  SAY#:[id number]  Patient Care Team: [name], MD as PCP - General Rosaura Bolon, [name], MD as Consulting Physician (Oncology) Jackolyn Confer, MD as Consulting Physician (General Surgery) Eppie Gibson, MD as Attending Physician (Radiation Oncology) Richmond Campbell, MD as Consulting Physician (Gastroenterology) Lavonna Monarch, MD as Consulting Physician (Dermatology) Delice Bison, Charlestine Massed, NP as Nurse Practitioner (Hematology and Oncology)  OTHER MD:  CHIEF COMPLAINT: Estrogen receptor positive breast cancer  CURRENT TREATMENT: Tamoxifen   INTERVAL HISTORY: [name] did not show for her 06/01/2019 visit.  The patient continues on tamoxifen, which she is tolerating well. She is experiencing no hot flashes and no more vaginal discharge than before she began taking the medication.  She was due for repeat mammography in late 03/2019. However, she contacted our office in early December with concerns regarding pain in her right breast from the nipple to the 6 o'clock position. She proceeded to right diagnostic mammogram and right breast ultrasound on 03/25/2019 at [place] showing: breast density category B; 5 mm benign cyst in upper-outer right breast; no other mammographic or targeted sonographic findings to explain patient's pain.  She later underwent her routine bilateral diagnostic mammography with tomography at [facility] [place] on 05/25/2019 showing: breast density category B; no evidence of malignancy in either breast.   REVIEW OF SYSTEMS: [name]     BREAST CANCER HISTORY: From the original intake note:  [name] had bilateral screening mammography with tomography scheduled routinely 08/01/2015 at the [place]. This showed a possible mass in the left breast. Accordingly on 08/15/2015 she underwent left diagnostic mammography with  tomography and ultrasonography. This found the breast density to be category be. On spot compression views and was a 0.9 cm spiculated mass in the upper outer left breast, which on ultrasound was at 2:30 o'clock 5 cm from the nipple and measured 0.8 cm. Ultrasound of the axilla was benign.  Biopsy of the left breast mass in question 08/19/2015 found ([id number]) an invasive ductal carcinoma (E-cadherin positive) which was estrogen receptor 100% positive, progesterone receptor 100% positive, both with strong staining intensity, with an MIB-1 of 10%, and no HER-2 amplification, the signals ratio being 1.23 and the number per cell 1.85.  Her subsequent history is as detailed below   PAST MEDICAL HISTORY: Past Medical History:  Diagnosis Date  . Anemia   . Anxiety   . Arthritis    knees  . Atrophic vaginitis   . Cancer [facility])    breast cancer - left  . Diverticulosis   . Endometriosis   . Family history of breast cancer   . Family history of colon cancer   . GERD (gastroesophageal reflux disease)   . Headache(784.0)   . Heart murmur    as a child only  . History of kidney stones    passed stones, no surgery required  . History of radiation therapy 01/30/16- 03/02/16   Left Breast 50 Gy in 25 fractions.   . Hyperlipidemia   . Hypertension   . Hypothyroidism   . Internal hemorrhoids   . Osteopenia   . Osteoporosis   . Personal history of chemotherapy   . Personal history of radiation therapy   . Rheumatic fever    age 63  . Sleep apnea    " very mild" does not wear CPAP  . Thyroid disease    Hypothyroid  .  Ulcer   . UTI (lower urinary tract infection)     PAST SURGICAL HISTORY: Past Surgical History:  Procedure Laterality Date  . APPENDECTOMY    . BREAST BIOPSY Left 08/19/2015  . BREAST LUMPECTOMY Left 09/08/2015  . BREAST LUMPECTOMY WITH RADIOACTIVE SEED AND SENTINEL LYMPH NODE BIOPSY Left 09/09/2015   Procedure: LEFT BREAST LUMPECTOMY WITH RADIOACTIVE SEED AND  SENTINEL LYMPH NODE BIOPSY;  Surgeon: Jackolyn Confer, MD;  Location: [place];  Service: General;  Laterality: Left;  . COLONOSCOPY    . DILATATION & CURETTAGE/HYSTEROSCOPY WITH MYOSURE N/A 10/09/2016   Procedure: DILATATION & CURETTAGE/HYSTEROSCOPY WITH MYOSURE;  Surgeon: Princess Bruins, MD;  Location: [place] [facility];  Service: Gynecology;  Laterality: N/A;  requesting 7:30am in our block  requests one hour  . ESOPHAGOGASTRODUODENOSCOPY ENDOSCOPY    . IR GENERIC HISTORICAL  12/05/2015   IR CV LINE INJECTION 12/05/2015 [name], MD [facility]  . LAPAROSCOPIC ENDOMETRIOSIS FULGURATION  1978  . PELVIC LAPAROSCOPY    . PORTACATH PLACEMENT N/A 10/04/2015   Procedure: INSERTION PORT-A-CATH WITH ULTRASOUND;  Surgeon: Jackolyn Confer, MD;  Location: [place];  Service: General;  Laterality: N/A;  . portacath removal    . TONSILLECTOMY    . ULNAR NERVE REPAIR  2010    FAMILY HISTORY Family History  Problem Relation Age of Onset  . Breast cancer Mother        Age 77  . Hypertension Mother   . Heart disease Father   . Stroke Father   . Colon cancer Father        dx 63s  . Breast cancer Paternal Grandmother        Age 55  . Diabetes Paternal Grandmother   . Heart disease Maternal Grandmother   The patient's father died at age 16 the patient's mother was diagnosed with breast cancer at age 3 and again at age 23, which was the year of her death. In addition a paternal grandmother was diagnosed with breast cancer in her 30s. The patient's father had colon cancer. The patient had 2 brothers, no sisters. There is no history of ovarian cancer in the family.   GYNECOLOGIC HISTORY:  No LMP recorded. Patient is postmenopausal. Menarche age 79. The patient is GX P0. She went through menopause in her late 10s. She did not take hormone replacement. She never used oral contraceptives.   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 Her husband [name]") is retired from [employer].  They have an adopted son, [name], who is completing a second Engineer, production degree at [employer] state area he carries a diagnosis of schizophrenia, but is obviously very functional. The patient has no grandchildren. She is a Nurse, learning disability.   ADVANCED DIRECTIVES:  In the absence of any documentation to the contrary, the patient's spouse is their HCPOA.    HEALTH MAINTENANCE: Social History   Tobacco Use  . Smoking status: Never Smoker  . Smokeless tobacco: Never Used  Substance Use Topics  . Alcohol use: No  . Drug use: No     Colonoscopy: 2012/[facility]   PAP: 2013?   Bone density: 2012/osteopenia   Lipid panel:  Allergies  Allergen Reactions  . Nitrofurantoin Nausea And Vomiting    GI upset  . Sulfamethoxazole-Trimethoprim Nausea And Vomiting and Other (See Comments)    GI upset    Current Outpatient Medications  Medication Sig Dispense Refill  . aspirin-acetaminophen-caffeine (EXCEDRIN MIGRAINE) 250-250-65 MG tablet Take 1 tablet by mouth daily as needed for  headache.    Marland Kitchen atenolol (TENORMIN) 50 MG tablet TAKE 1 TABLET BY MOUTH DAILY 30 tablet 5  . chlorhexidine (PERIDEX) 0.12 % solution Use as directed 15 mLs in the mouth or throat 2 (two) times daily.     . hydrochlorothiazide (MICROZIDE) 12.5 MG capsule Take 1 capsule (12.5 mg total) by mouth daily. 90 capsule 3  . levothyroxine (SYNTHROID) 50 MCG tablet TAKE 1 TABLET (50 MCG TOTAL) BY MOUTH DAILY BEFORE BREAKFAST. 30 tablet 5  . Melatonin 3 MG TBDP Take by mouth.    Earney Navy Bicarbonate (ZEGERID) 20-1100 MG CAPS capsule Take 1 capsule by mouth daily before breakfast.    . simvastatin (ZOCOR) 40 MG tablet TAKE 1 TABLET BY MOUTH EVERYDAY AT BEDTIME 90 tablet 0  . tamoxifen (NOLVADEX) 20 MG tablet TAKE 1 TABLET BY MOUTH EVERY DAY 30 tablet 32  . traZODone (DESYREL) 50 MG tablet TAKE 1 TABLET (50 MG TOTAL) BY MOUTH AT BEDTIME. 30 tablet 2  . TURMERIC PO Take 320 mg by mouth daily.    Marland Kitchen venlafaxine XR (EFFEXOR-XR) 150 MG  24 hr capsule TAKE 1 CAPSULE BY MOUTH EVERY DAY 90 capsule 0   Current Facility-Administered Medications  Medication Dose Route Frequency Provider Last Rate Last Admin  . betamethasone acetate-betamethasone sodium phosphate (CELESTONE) injection 12 mg  12 mg Other Once Magnus Sinning, MD        OBJECTIVE: Middle-aged white woman  There were no vitals filed for this visit.   There is no height or weight on file to calculate BMI.      LAB RESULTS:  CMP     Component Value Date/Time   NA 143 03/26/2018 1442   NA 140 01/03/2017 1052   K 3.6 03/26/2018 1442   K 4.5 01/03/2017 1052   CL 107 03/26/2018 1442   CO2 25 03/26/2018 1442   CO2 27 01/03/2017 1052   GLUCOSE 130 (H) 03/26/2018 1442   GLUCOSE 114 01/03/2017 1052   BUN 17 03/26/2018 1442   BUN 25.5 01/03/2017 1052   CREATININE 0.83 03/26/2018 1442   CREATININE 1.1 01/03/2017 1052   CALCIUM 9.2 03/26/2018 1442   CALCIUM 10.7 (H) 01/03/2017 1052   PROT 7.0 03/26/2018 1442   PROT 7.4 01/03/2017 1052   ALBUMIN 3.9 03/26/2018 1442   ALBUMIN 4.2 01/03/2017 1052   AST 16 03/26/2018 1442   AST 20 01/03/2017 1052   ALT 10 03/26/2018 1442   ALT 17 01/03/2017 1052   ALKPHOS 91 03/26/2018 1442   ALKPHOS 60 01/03/2017 1052   BILITOT 0.2 (L) 03/26/2018 1442   BILITOT 0.47 01/03/2017 1052   GFRNONAA >60 03/26/2018 1442   GFRAA >60 03/26/2018 1442    INo results found for: SPEP, UPEP  Lab Results  Component Value Date   WBC 4.9 03/26/2018   NEUTROABS 2.7 03/26/2018   HGB 11.6 (L) 03/26/2018   HCT 37.6 03/26/2018   MCV 85.5 03/26/2018   PLT 214 03/26/2018      Chemistry      Component Value Date/Time   NA 143 03/26/2018 1442   NA 140 01/03/2017 1052   K 3.6 03/26/2018 1442   K 4.5 01/03/2017 1052   CL 107 03/26/2018 1442   CO2 25 03/26/2018 1442   CO2 27 01/03/2017 1052   BUN 17 03/26/2018 1442   BUN 25.5 01/03/2017 1052   CREATININE 0.83 03/26/2018 1442   CREATININE 1.1 01/03/2017 1052      Component Value  Date/Time   CALCIUM 9.2  03/26/2018 1442   CALCIUM 10.7 (H) 01/03/2017 1052   ALKPHOS 91 03/26/2018 1442   ALKPHOS 60 01/03/2017 1052   AST 16 03/26/2018 1442   AST 20 01/03/2017 1052   ALT 10 03/26/2018 1442   ALT 17 01/03/2017 1052   BILITOT 0.2 (L) 03/26/2018 1442   BILITOT 0.47 01/03/2017 1052       No results found for: LABCA2  No components found for: LABCA125  No results for input(s): INR in the last 168 hours.  Urinalysis    Component Value Date/Time   COLORURINE YELLOW 09/23/2016 0914   APPEARANCEUR HAZY (A) 09/23/2016 0914   LABSPEC 1.018 09/23/2016 0914   PHURINE 5.0 09/23/2016 0914   GLUCOSEU NEGATIVE 09/23/2016 0914   HGBUR SMALL (A) 09/23/2016 0914   BILIRUBINUR neg 05/01/2017 1706   KETONESUR NEGATIVE 09/23/2016 0914   PROTEINUR neg 05/01/2017 1706   PROTEINUR NEGATIVE 09/23/2016 0914   UROBILINOGEN 0.2 05/01/2017 1706   NITRITE neg 05/01/2017 1706   NITRITE NEGATIVE 09/23/2016 0914   LEUKOCYTESUR Negative 05/01/2017 1706    ELIGIBLE FOR AVAILABLE RESEARCH PROTOCOL: no  STUDIES: MM DIAG BREAST TOMO BILATERAL  Result Date: 05/25/2019 CLINICAL DATA:  79 year old female presenting for routine annual surveillance status post left breast lumpectomy in 2017. EXAM: DIGITAL DIAGNOSTIC BILATERAL MAMMOGRAM WITH CAD AND TOMO COMPARISON:  Previous exam(s). ACR Breast Density Category b: There are scattered areas of fibroglandular density. FINDINGS: The left breast lumpectomy site is stable. No suspicious calcifications, masses or areas of distortion are seen in the bilateral breasts. Mammographic images were processed with CAD. IMPRESSION: Stable left breast lumpectomy site. No mammographic evidence of malignancy in the bilateral breasts. RECOMMENDATION: Diagnostic mammogram is suggested in 1 year. (Code:DM-B-01Y) I have discussed the findings and recommendations with the patient. If applicable, a reminder letter will be sent to the patient regarding the next  appointment. BI-RADS CATEGORY  2: Benign. Electronically Signed   By: Ammie Ferrier M.D.   On: 05/25/2019 15:15    ASSESSMENT: 79 y.o. [place] woman status post left breast upper outer quadrant biopsy 08/19/2015 for a clinical T1b N0, stage IA invasive ductal carcinoma, strongly estrogen and progesterone receptor positive, HER-2 not amplified, with an MIB-1 of 10%  (1) left lumpectomy and sentinel lymph node sampling 09/09/2015 showed apT1b pN0, stage IA invasive ductal carcinoma, grade 1, repeat HER-2 again negative.   (2) Oncotype DX score of 29 predicts a 19% risk of recurrence outside the breast within the next 10 years if the patient's only systemic therapy is tamoxifen for 5 years. It also predicts a further risk reduction with chemotherapy of 8%.  (3) adjuvant chemotherapy consisting of cyclophosphamide and docetaxel every 21 days 4, started 10/24/2015, completed 12/26/2015.  (4) adjuvant radiation 01/30/16 - 03/02/16 Site/dose:   Left breast: 50 Gy in 25 fractions  (5) started tamoxifen 04/16/2016  (a) bone density October 2017 shows a T score of -2.1 (osteopenia)  (6) genetics testing 09/19/2015 through the Breast/Ovarian gene panel offered by [facility] found no deleterious mutations in ATM, BARD1, BRCA1, BRCA2, BRIP1, CDH1, CHEK2, EPCAM, FANCC, MLH1, MSH2, MSH6, NBN, PALB2, PMS2, PTEN, RAD51C, RAD51D, TP53, and XRCC2.   PLAN: Carolann did not show for today's visit.    Latonya Knight, [name], MD  06/01/19 3:53 PM Medical Oncology and Hematology [facility] [place], [place] [address] Tel. [phone]    Fax. [phone]   I, Wilburn Mylar, am acting as scribe for Dr. [name]. Meril Dray.  Joylene Grapes Marshae Azam  MD, have reviewed the above documentation for accuracy and completeness, and I agree with the above.    *Total Encounter Time as defined by the Centers for Medicare and Medicaid Services includes, in addition to the face-to-face time of  a patient visit (documented in the note above) non-face-to-face time: obtaining and reviewing outside history, ordering and reviewing medications, tests or procedures, care coordination (communications with other health care professionals or caregivers) and documentation in the medical record.

## 2019-06-01 ENCOUNTER — Inpatient Hospital Stay (HOSPITAL_BASED_OUTPATIENT_CLINIC_OR_DEPARTMENT_OTHER): Payer: Medicare Other | Admitting: Oncology

## 2019-06-01 ENCOUNTER — Encounter: Payer: Self-pay | Admitting: Oncology

## 2019-06-01 ENCOUNTER — Inpatient Hospital Stay: Payer: Medicare Other | Attending: Adult Health

## 2019-06-01 DIAGNOSIS — M818 Other osteoporosis without current pathological fracture: Secondary | ICD-10-CM

## 2019-06-01 DIAGNOSIS — Z170 Estrogen receptor positive status [ER+]: Secondary | ICD-10-CM

## 2019-06-01 DIAGNOSIS — M4120 Other idiopathic scoliosis, site unspecified: Secondary | ICD-10-CM

## 2019-06-01 DIAGNOSIS — C50412 Malignant neoplasm of upper-outer quadrant of left female breast: Secondary | ICD-10-CM

## 2019-06-17 ENCOUNTER — Ambulatory Visit: Payer: Medicare Other | Attending: Internal Medicine

## 2019-06-17 ENCOUNTER — Ambulatory Visit: Payer: Medicare Other

## 2019-06-17 DIAGNOSIS — Z23 Encounter for immunization: Secondary | ICD-10-CM | POA: Insufficient documentation

## 2019-06-17 NOTE — Progress Notes (Signed)
   Covid-19 Vaccination Clinic  Name:  Jaydalyn Service    MRN: [id number] DOB: 07-02-1940  06/17/2019  Ms. Hanlan was observed post Covid-19 immunization for 15 minutes without incident. She was provided with Vaccine Information Sheet and instruction to access the V-Safe system.   Ms. Swaine was instructed to call 911 with any severe reactions post vaccine: Marland Kitchen Difficulty breathing  . Swelling of face and throat  . A fast heartbeat  . A bad rash all over body  . Dizziness and weakness   Immunizations Administered    Name Date Dose VIS Date Route   Pfizer COVID-19 Vaccine 06/17/2019  2:06 PM 0.3 mL 03/27/2019 Intramuscular   Manufacturer: [place]   Lot: [id number]   [place]: [id number]

## 2019-07-05 ENCOUNTER — Other Ambulatory Visit: Payer: Self-pay | Admitting: Family Medicine

## 2019-08-05 ENCOUNTER — Other Ambulatory Visit: Payer: Self-pay | Admitting: Family Medicine

## 2019-08-10 ENCOUNTER — Other Ambulatory Visit: Payer: Self-pay

## 2019-08-11 ENCOUNTER — Ambulatory Visit (INDEPENDENT_AMBULATORY_CARE_PROVIDER_SITE_OTHER): Payer: Medicare Other | Admitting: Family Medicine

## 2019-08-11 ENCOUNTER — Encounter: Payer: Self-pay | Admitting: Family Medicine

## 2019-08-11 VITALS — BP 138/76 | HR 65 | Temp 98.0°F | Wt 157.3 lb

## 2019-08-11 DIAGNOSIS — I10 Essential (primary) hypertension: Secondary | ICD-10-CM

## 2019-08-11 DIAGNOSIS — F5104 Psychophysiologic insomnia: Secondary | ICD-10-CM

## 2019-08-11 DIAGNOSIS — M7061 Trochanteric bursitis, right hip: Secondary | ICD-10-CM | POA: Diagnosis not present

## 2019-08-11 DIAGNOSIS — M7062 Trochanteric bursitis, left hip: Secondary | ICD-10-CM

## 2019-08-11 DIAGNOSIS — E038 Other specified hypothyroidism: Secondary | ICD-10-CM

## 2019-08-11 DIAGNOSIS — E785 Hyperlipidemia, unspecified: Secondary | ICD-10-CM | POA: Diagnosis not present

## 2019-08-11 MED ORDER — TRAZODONE HCL 100 MG PO TABS: 100.0000 mg | ORAL_TABLET | Freq: Every day | ORAL | 3 refills | Status: DC

## 2019-08-11 NOTE — Progress Notes (Signed)
Subjective:     Patient ID: [name], female   DOB: 07/02/1940, 79 y.o.   MRN: [id number]  HPI   [name] is here for medical follow-up and also bilateral progressive hip pain.  Her chronic problems include history of hypertension, history of migraine headaches, hypothyroidism, osteoporosis, hyperlipidemia, history of breast cancer.  Her current medications include atenolol, HCTZ, levothyroxine, simvastatin, tamoxifen, trazodone.  She takes trazodone for insomnia.  She has been on 50 mg dose for some time but has recently had some increased difficulty getting to sleep.  She had titrated herself up to 100 mg and that seemed to work better.  She avoids late the use of caffeine.  No consistent use of alcohol.  Denies depression symptoms.  She is overdue for labs including lipids and thyroid function.  Compliant with all medications.  Denies any side effects.  She complains of bilateral hip pain..  Location is lateral hip and she has some pain at rest with rolling over in bed but also with ambulation.  No medial or anterior hip pain.  Denies any current low back pain.  She notices soreness to palpation.  She tried ice in the past without relief.  Pain is moderate to even severe at times.  Denies any stiffness of the hip  Past Medical History:  Diagnosis Date  . Anemia   . Anxiety   . Arthritis    knees  . Atrophic vaginitis   . Cancer [facility])    breast cancer - left  . Diverticulosis   . Endometriosis   . Family history of breast cancer   . Family history of colon cancer   . GERD (gastroesophageal reflux disease)   . Headache(784.0)   . Heart murmur    as a child only  . History of kidney stones    passed stones, no surgery required  . History of radiation therapy 01/30/16- 03/02/16   Left Breast 50 Gy in 25 fractions.   . Hyperlipidemia   . Hypertension   . Hypothyroidism   . Internal hemorrhoids   . Osteopenia   . Osteoporosis   . Personal history of chemotherapy   .  Personal history of radiation therapy   . Rheumatic fever    age 5  . Sleep apnea    " very mild" does not wear CPAP  . Thyroid disease    Hypothyroid  . Ulcer   . UTI (lower urinary tract infection)    Past Surgical History:  Procedure Laterality Date  . APPENDECTOMY    . BREAST BIOPSY Left 08/19/2015  . BREAST LUMPECTOMY Left 09/08/2015  . BREAST LUMPECTOMY WITH RADIOACTIVE SEED AND SENTINEL LYMPH NODE BIOPSY Left 09/09/2015   Procedure: LEFT BREAST LUMPECTOMY WITH RADIOACTIVE SEED AND SENTINEL LYMPH NODE BIOPSY;  Surgeon: Jackolyn Confer, MD;  Location: [place];  Service: General;  Laterality: Left;  . COLONOSCOPY    . DILATATION & CURETTAGE/HYSTEROSCOPY WITH MYOSURE N/A 10/09/2016   Procedure: DILATATION & CURETTAGE/HYSTEROSCOPY WITH MYOSURE;  Surgeon: Princess Bruins, MD;  Location: [facility];  Service: Gynecology;  Laterality: N/A;  requesting 7:30am in our block  requests one hour  . ESOPHAGOGASTRODUODENOSCOPY ENDOSCOPY    . IR GENERIC HISTORICAL  12/05/2015   IR CV LINE INJECTION 12/05/2015 [name], MD [facility]  . LAPAROSCOPIC ENDOMETRIOSIS FULGURATION  1978  . PELVIC LAPAROSCOPY    . PORTACATH PLACEMENT N/A 10/04/2015   Procedure: INSERTION PORT-A-CATH WITH ULTRASOUND;  Surgeon: Jackolyn Confer, MD;  Location: [place];  Service: General;  Laterality: N/A;  . portacath removal    . TONSILLECTOMY    . ULNAR NERVE REPAIR  2010    reports that she has never smoked. She has never used smokeless tobacco. She reports that she does not drink alcohol or use drugs. family history includes Breast cancer in her mother and paternal grandmother; Colon cancer in her father; Diabetes in her paternal grandmother; Heart disease in her father and maternal grandmother; Hypertension in her mother; Stroke in her father. Allergies  Allergen Reactions  . Nitrofurantoin Nausea And Vomiting    GI upset  . Sulfamethoxazole-Trimethoprim Nausea And Vomiting and Other (See Comments)    GI upset      Review of Systems  Constitutional: Negative for chills, fatigue, fever and unexpected weight change.  Eyes: Negative for visual disturbance.  Respiratory: Negative for cough, chest tightness, shortness of breath and wheezing.   Cardiovascular: Negative for chest pain, palpitations and leg swelling.  Gastrointestinal: Negative for abdominal pain.  Endocrine: Negative for polydipsia and polyuria.  Musculoskeletal: Negative for back pain.  Neurological: Negative for dizziness, seizures, syncope, weakness, light-headedness and headaches.       Objective:   Physical Exam Vitals reviewed.  Constitutional:      Appearance: Normal appearance.  Cardiovascular:     Rate and Rhythm: Normal rate and regular rhythm.  Pulmonary:     Effort: Pulmonary effort is normal.     Breath sounds: Normal breath sounds.  Musculoskeletal:     Right lower leg: No edema.     Left lower leg: No edema.     Comments: Excellent range of motion of both hips.  She has tenderness over the greater trochanteric bursa bilaterally.  No visible swelling or erythema.  Neurological:     Mental Status: She is alert.        Assessment:     #1 bilateral greater trochanteric hip bursitis with moderate to severe pain not relieved with conservative measures  #2 hypertension stable  #3 hyperlipidemia.  Patient on statin with simvastatin and overdue for follow-up labs  #4 hypothyroidism on replacement and overdue for labs  #5 insomnia which has been chronic    Plan:     -We discussed risk of steroid injection into bursa region lateral hip bilaterally including risk of bruising, bleeding, low risk of infection and patient consented.  We prepped the skin with Betadine.  Using 25-gauge 1/2 inch needle injected 40 mg of Medrol and 2 cc of plain Xylocaine.  Patient tolerated well.  -She will try some icing of hip region tonight and be in touch if hip pain not improving by next week  -Titrate trazodone to 100 mg  nightly with new prescription sent.  Sleep hygiene also discussed  -Recheck labs including lipid panel, hepatic panel, basic metabolic panel, TSH  [name] MD [facility] at [facility]

## 2019-08-11 NOTE — Patient Instructions (Signed)
Hip Bursitis  Hip bursitis is inflammation of a fluid-filled sac (bursa) in the hip joint. The bursa prevents the bones in the hip joint from rubbing against each other. Hip bursitis can cause mild to moderate pain, and symptoms often come and go over time. What are the causes? This condition may be caused by:  Injury to the hip.  Overuse of the muscles that surround the hip joint.  Previous injury or surgery of the hip.  Arthritis or gout.  Diabetes.  Thyroid disease.  Infection. In some cases, the cause may not be known. What are the signs or symptoms? Symptoms of this condition include:  Mild or moderate pain in the hip area. Pain may get worse with movement.  Tenderness and swelling of the hip, especially on the outer side of the hip.  In rare cases, the bursa may become infected. This may cause a fever, as well as warmth and redness in the area. Symptoms may come and go. How is this diagnosed? This condition may be diagnosed based on:  A physical exam.  Your medical history.  X-rays.  Removal of fluid from your inflamed bursa for testing (biopsy). You may be sent to a health care provider who specializes in bone diseases (orthopedist) or a provider who specializes in joint inflammation (rheumatologist). How is this treated? This condition is treated by resting, icing, applying pressure (compression), and raising (elevating) the injured area. This is called RICE treatment. In some cases, this may be enough to make your symptoms go away. Treatment may also include:  Using crutches.  Draining fluid out of the bursa to help relieve swelling.  Injecting medicine that helps to reduce inflammation (cortisone).  Additional medicines if the bursa is infected. Follow these instructions at home: Managing pain, stiffness, and swelling   If directed, put ice on the painful area. ? Put ice in a plastic bag. ? Place a towel between your skin and the bag. ? Leave the ice  on for 20 minutes, 2-3 times a day. ? Raise (elevate) your hip above the level of your heart as much as you can without pain. To do this, try putting a pillow under your hips while you lie down. Activity  Return to your normal activities as told by your health care provider. Ask your health care provider what activities are safe for you.  Rest and protect your hip as much as possible until your pain and swelling get better. General instructions  Take over-the-counter and prescription medicines only as told by your health care provider.  Wear compression wraps only as told by your health care provider.  Do not use your hip to support your body weight until your health care provider says that you can. Use crutches as told by your health care provider.  Gently massage and stretch your injured area as often as is comfortable.  Keep all follow-up visits as told by your health care provider. This is important. How is this prevented?  Exercise regularly, as told by your health care provider.  Warm up and stretch before being active.  Cool down and stretch after being active.  If an activity irritates your hip or causes pain, avoid the activity as much as possible.  Avoid sitting down for long periods at a time. Contact a health care provider if you:  Have a fever.  Develop new symptoms.  Have difficulty walking or doing everyday activities.  Have pain that gets worse or does not get better with medicine.    Develop red skin or a feeling of warmth in your hip area. Get help right away if you:  Cannot move your hip.  Have severe pain. Summary  Hip bursitis is inflammation of a fluid-filled sac (bursa) in the hip joint.  Hip bursitis can cause mild to moderate pain, and symptoms often come and go over time.  This condition is treated with rest, ice, compression, elevation, and medicines. This information is not intended to replace advice given to you by your health care  provider. Make sure you discuss any questions you have with your health care provider. Document Revised: 12/09/2017 Document Reviewed: 12/09/2017 Elsevier Patient Education  2020 Elsevier Inc.  

## 2019-08-12 LAB — TSH: TSH: 2.14 u[IU]/mL (ref 0.35–4.50)

## 2019-08-12 LAB — LIPID PANEL
Cholesterol: 143 mg/dL (ref 0–200)
HDL: 47.6 mg/dL (ref >39.00)
NonHDL: 95.87
Total CHOL/HDL Ratio: 3
Triglycerides: 218 mg/dL — ABNORMAL HIGH (ref 0.0–149.0)
VLDL: 43.6 mg/dL — ABNORMAL HIGH (ref 0.0–40.0)

## 2019-08-12 LAB — BASIC METABOLIC PANEL
BUN: 34 mg/dL — ABNORMAL HIGH (ref 6–23)
CO2: 31 mEq/L (ref 19–32)
Calcium: 9.4 mg/dL (ref 8.4–10.5)
Chloride: 102 mEq/L (ref 96–112)
Creatinine, Ser: 1.05 mg/dL (ref 0.40–1.20)
GFR: 50.52 mL/min — ABNORMAL LOW (ref >60.00)
Glucose, Bld: 100 mg/dL — ABNORMAL HIGH (ref 70–99)
Potassium: 4.2 mEq/L (ref 3.5–5.1)
Sodium: 142 mEq/L (ref 135–145)

## 2019-08-12 LAB — HEPATIC FUNCTION PANEL
ALT: 17 U/L (ref 0–35)
AST: 18 U/L (ref 0–37)
Albumin: 4.3 g/dL (ref 3.5–5.2)
Alkaline Phosphatase: 66 U/L (ref 39–117)
Bilirubin, Direct: 0.1 mg/dL (ref 0.0–0.3)
Total Bilirubin: 0.3 mg/dL (ref 0.2–1.2)
Total Protein: 6.7 g/dL (ref 6.0–8.3)

## 2019-08-12 LAB — LDL CHOLESTEROL, DIRECT: Direct LDL: 61 mg/dL

## 2019-08-14 MED ORDER — METHYLPREDNISOLONE ACETATE 40 MG/ML IJ SUSP
40.0000 mg | Freq: Once | INTRAMUSCULAR | Status: AC
  Administered 2019-08-14: 40 mg via INTRAMUSCULAR

## 2019-08-14 NOTE — Addendum Note (Signed)
Addended by: Modena Morrow R on: 08/14/2019 06:47 AM   Modules accepted: Orders

## 2019-08-19 NOTE — Progress Notes (Signed)
Unable to change depomedrol in system

## 2019-09-23 ENCOUNTER — Encounter: Payer: Self-pay | Admitting: Family Medicine

## 2019-09-23 ENCOUNTER — Other Ambulatory Visit: Payer: Self-pay

## 2019-09-23 ENCOUNTER — Ambulatory Visit (INDEPENDENT_AMBULATORY_CARE_PROVIDER_SITE_OTHER): Payer: Medicare Other | Admitting: Family Medicine

## 2019-09-23 VITALS — BP 124/76 | HR 76 | Temp 97.8°F | Wt 156.8 lb

## 2019-09-23 DIAGNOSIS — R631 Polydipsia: Secondary | ICD-10-CM

## 2019-09-23 DIAGNOSIS — R682 Dry mouth, unspecified: Secondary | ICD-10-CM | POA: Diagnosis not present

## 2019-09-23 DIAGNOSIS — M79605 Pain in left leg: Secondary | ICD-10-CM

## 2019-09-23 DIAGNOSIS — M79604 Pain in right leg: Secondary | ICD-10-CM

## 2019-09-23 LAB — GLUCOSE, POCT (MANUAL RESULT ENTRY): POC Glucose: 112 mg/dl — AB (ref 70–99)

## 2019-09-23 NOTE — Patient Instructions (Signed)
Reduce the Trazodone to 50 mg at night for at least one week and then consider discontinue.  Consider repeat trial of Melatonin up to 5 mg at night.    I will put in referral to [facility] [place] regarding back.

## 2019-09-23 NOTE — Progress Notes (Signed)
Established Patient Office Visit  Subjective:  Patient ID: [name], female    DOB: Oct 19, 1940  Age: 79 y.o. MRN: [id number]  CC:  Chief Complaint  Patient presents with  . Leg Pain    Pt states that for 2 weeks she has been having leg pain all the way down to her feet     HPI [name] presents for 2-week history of bilateral leg pain from the knee down toward the feet.  She has history of extensive back surgery thoracolumbar spine at [facility] on 01/10/2018.  She had basically T3 to pelvis PSIF and hemilaminectomy L1-L3 for scoliosis and sagittal imbalance.  She presents today with bilateral leg pain.  She does have some low back pain but denies any recent injury.  Her pain radiates from the knee down toward the feet.  Her pain is worse with walking.  Sharp to achy quality.  Not particularly at rest.  She is taken Tylenol and Excedrin without much relief.  Left leg pain seems to be slightly worse than right.  No numbness.  No rashes.  No fever.  She feels her gait feels off balance and she wonders if she has some weakness in lower extremities.  Also complains of increased dry mouth over the past several days.  We recently increased her trazodone to 100 mg but she is not seen any improvement does not feel like this is helped any with her sleep.  She has had some chronic sleep difficulties in the past.  She has had some polydipsia but no polyuria.  She had a recent injection greater trochanter bursa of the hip but did not see any improvement in her hip pain.  Past Medical History:  Diagnosis Date  . Anemia   . Anxiety   . Arthritis    knees  . Atrophic vaginitis   . Cancer [facility])    breast cancer - left  . Diverticulosis   . Endometriosis   . Family history of breast cancer   . Family history of colon cancer   . GERD (gastroesophageal reflux disease)   . Headache(784.0)   . Heart murmur    as a child only  . History of kidney stones    passed stones,  no surgery required  . History of radiation therapy 01/30/16- 03/02/16   Left Breast 50 Gy in 25 fractions.   . Hyperlipidemia   . Hypertension   . Hypothyroidism   . Internal hemorrhoids   . Osteopenia   . Osteoporosis   . Personal history of chemotherapy   . Personal history of radiation therapy   . Rheumatic fever    age 45  . Sleep apnea    " very mild" does not wear CPAP  . Thyroid disease    Hypothyroid  . Ulcer   . UTI (lower urinary tract infection)     Past Surgical History:  Procedure Laterality Date  . APPENDECTOMY    . BREAST BIOPSY Left 08/19/2015  . BREAST LUMPECTOMY Left 09/08/2015  . BREAST LUMPECTOMY WITH RADIOACTIVE SEED AND SENTINEL LYMPH NODE BIOPSY Left 09/09/2015   Procedure: LEFT BREAST LUMPECTOMY WITH RADIOACTIVE SEED AND SENTINEL LYMPH NODE BIOPSY;  Surgeon: Jackolyn Confer, MD;  Location: [place];  Service: General;  Laterality: Left;  . COLONOSCOPY    . DILATATION & CURETTAGE/HYSTEROSCOPY WITH MYOSURE N/A 10/09/2016   Procedure: DILATATION & CURETTAGE/HYSTEROSCOPY WITH MYOSURE;  Surgeon: Princess Bruins, MD;  Location: [facility];  Service: Gynecology;  Laterality: N/A;  requesting 7:30am in our block  requests one hour  . ESOPHAGOGASTRODUODENOSCOPY ENDOSCOPY    . IR GENERIC HISTORICAL  12/05/2015   IR CV LINE INJECTION 12/05/2015 [name], MD [facility]INTER[facility] RAD  . LAPAROSCOPIC ENDOMETRIOSIS FULGURATION  1978  . PELVIC LAPAROSCOPY    . PORTACATH PLACEMENT N/A 10/04/2015   Procedure: INSERTION PORT-A-CATH WITH ULTRASOUND;  Surgeon: Jackolyn Confer, MD;  Location: [place];  Service: General;  Laterality: N/A;  . portacath removal    . TONSILLECTOMY    . ULNAR NERVE REPAIR  2010    Family History  Problem Relation Age of Onset  . Breast cancer Mother        Age 57  . Hypertension Mother   . Heart disease Father   . Stroke Father   . Colon cancer Father        dx 67s  . Breast cancer Paternal Grandmother        Age 69  . Diabetes Paternal Grandmother    . Heart disease Maternal Grandmother     Social History   Socioeconomic History  . Marital status: Married    Spouse name: Not on file  . Number of children: 0  . Years of education: Not on file  . Highest education level: Not on file  Occupational History  . Occupation: retired  Tobacco Use  . Smoking status: Never Smoker  . Smokeless tobacco: Never Used  Substance and Sexual Activity  . Alcohol use: No  . Drug use: No  . Sexual activity: Never    Birth control/protection: Post-menopausal  Other Topics Concern  . Not on file  Social History Narrative   Retired Oncologist.    Social Determinants of Health   Financial Resource Strain:   . Difficulty of Paying Living Expenses:   Food Insecurity:   . Worried About Charity fundraiser in the Last Year:   . Arboriculturist in the Last Year:   Transportation Needs:   . Film/video editor (Medical):   Marland Kitchen Lack of Transportation (Non-Medical):   Physical Activity:   . Days of Exercise per Week:   . Minutes of Exercise per Session:   Stress:   . Feeling of Stress :   Social Connections:   . Frequency of Communication with Friends and Family:   . Frequency of Social Gatherings with Friends and Family:   . Attends Religious Services:   . Active Member of Clubs or Organizations:   . Attends Archivist Meetings:   Marland Kitchen Marital Status:   Intimate Partner Violence:   . Fear of Current or Ex-Partner:   . Emotionally Abused:   Marland Kitchen Physically Abused:   . Sexually Abused:     Outpatient Medications Prior to Visit  Medication Sig Dispense Refill  . aspirin-acetaminophen-caffeine (EXCEDRIN MIGRAINE) 250-250-65 MG tablet Take 1 tablet by mouth daily as needed for headache.    Marland Kitchen atenolol (TENORMIN) 50 MG tablet TAKE 1 TABLET BY MOUTH DAILY 30 tablet 5  . chlorhexidine (PERIDEX) 0.12 % solution Use as directed 15 mLs in the mouth or throat 2 (two) times daily.     . hydrochlorothiazide (MICROZIDE) 12.5 MG  capsule TAKE 1 CAPSULE BY MOUTH EVERY DAY 30 capsule 5  . levothyroxine (SYNTHROID) 50 MCG tablet TAKE 1 TABLET (50 MCG TOTAL) BY MOUTH DAILY BEFORE BREAKFAST. 30 tablet 5  . Melatonin 3 MG TBDP Take by mouth.    Earney Navy Bicarbonate (ZEGERID) 20-1100 MG CAPS capsule Take 1 capsule  by mouth daily before breakfast.    . simvastatin (ZOCOR) 40 MG tablet TAKE 1 TABLET BY MOUTH ONCE AT BEDTIME 30 tablet 2  . tamoxifen (NOLVADEX) 20 MG tablet TAKE 1 TABLET BY MOUTH EVERY DAY 30 tablet 32  . traZODone (DESYREL) 100 MG tablet Take 1 tablet (100 mg total) by mouth at bedtime. 90 tablet 3  . TURMERIC PO Take 320 mg by mouth daily.    Marland Kitchen venlafaxine XR (EFFEXOR-XR) 150 MG 24 hr capsule TAKE 1 CAPSULE BY MOUTH EVERY DAY 30 capsule 2   Facility-Administered Medications Prior to Visit  Medication Dose Route Frequency Provider Last Rate Last Admin  . betamethasone acetate-betamethasone sodium phosphate (CELESTONE) injection 12 mg  12 mg Other Once Magnus Sinning, MD        Allergies  Allergen Reactions  . Nitrofurantoin Nausea And Vomiting    GI upset  . Sulfamethoxazole-Trimethoprim Nausea And Vomiting and Other (See Comments)    GI upset    ROS Review of Systems  Constitutional: Negative for chills and fever.  HENT: Negative for ear pain.   Respiratory: Negative for cough and shortness of breath.   Cardiovascular: Negative for chest pain.  Gastrointestinal: Negative for abdominal pain.  Endocrine: Negative for polyuria.  Musculoskeletal: Positive for back pain and gait problem.      Objective:    Physical Exam  Constitutional: She is oriented to person, place, and time. She appears well-developed and well-nourished.  Cardiovascular: Normal rate and regular rhythm.  She has excellent dorsalis pedis and posterior tibial pulses in both feet.  Both feet are warm to touch with good capillary refill.  Pulmonary/Chest: Effort normal and breath sounds normal.  Musculoskeletal:         General: No edema.     Comments: No leg edema.  No color changes.  No localized tenderness to palpation  Neurological: She is alert and oriented to person, place, and time. No cranial nerve deficit.  She has diminished reflexes in both knees.  She has 1+ right ankle reflex and unable to elicit left ankle. She seems to have some mild weakness with dorsiflexion bilaterally.    BP 124/76 (BP Location: Left Arm, Patient Position: Sitting, Cuff Size: Normal)   Pulse 76   Temp 97.8 F (36.6 C) (Temporal)   Wt 156 lb 12.8 oz (71.1 kg)   SpO2 96%   BMI 30.62 kg/m  Wt Readings from Last 3 Encounters:  09/23/19 156 lb 12.8 oz (71.1 kg)  08/11/19 157 lb 4.8 oz (71.4 kg)  11/27/18 151 lb 1.6 oz (68.5 kg)     Health Maintenance Due  Topic Date Due  . Hepatitis C Screening  Never done  . PAP SMEAR-Modifier  09/13/2018    There are no preventive care reminders to display for this patient.  Lab Results  Component Value Date   TSH 2.14 08/11/2019   Lab Results  Component Value Date   WBC 4.9 03/26/2018   HGB 11.6 (L) 03/26/2018   HCT 37.6 03/26/2018   MCV 85.5 03/26/2018   PLT 214 03/26/2018   Lab Results  Component Value Date   NA 142 08/11/2019   K 4.2 08/11/2019   CHLORIDE 103 01/03/2017   CO2 31 08/11/2019   GLUCOSE 100 (H) 08/11/2019   BUN 34 (H) 08/11/2019   CREATININE 1.05 08/11/2019   BILITOT 0.3 08/11/2019   ALKPHOS 66 08/11/2019   AST 18 08/11/2019   ALT 17 08/11/2019   PROT 6.7 08/11/2019   ALBUMIN  4.3 08/11/2019   CALCIUM 9.4 08/11/2019   ANIONGAP 11 03/26/2018   EGFR 46 (L) 01/03/2017   GFR 50.52 (L) 08/11/2019   Lab Results  Component Value Date   CHOL 143 08/11/2019   Lab Results  Component Value Date   HDL 47.60 08/11/2019   No results found for: [facility]  Lab Results  Component Value Date   CHOLHDL 3 08/11/2019   Lab Results  Component Value Date   HGBA1C 5.8 11/15/2014      Assessment & Plan:   #1 bilateral lower leg pain.   Etiology unclear.  No evidence for vascular compromise.  No reproducible tenderness.  Question related to her extensive back history.  She does have loss of patellar reflexes which she states is new.  Watch out for any progressive weakness to suggest Guillain-Barre'.   -We recommended follow-up at [facility] for further evaluation.  Also we have noted on exam today she has loss of reflexes in both knees as well as left ankle and she states this is new.  May need to see neurology if she has any progressive lower extremity weakness.  She is ambulating without assistance without difficulty this time  #2 complains of dry mouth.  CBG today approximately 4 hours postprandial 112.  Question related to trazodone  #3 chronic insomnia.  Not improved with increased dose of trazodone  -Taper back trazodone to 50 mg nightly for 1 week and then discontinue. -She will consider repeat trial of melatonin and take up to 5 mg nightly  No orders of the defined types were placed in this encounter.   Follow-up: No follow-ups on file.    [name], MD

## 2019-10-01 ENCOUNTER — Telehealth: Payer: Self-pay | Admitting: Family Medicine

## 2019-10-01 NOTE — Telephone Encounter (Signed)
The patient called needing an appointment with Burchette only for her leg pain that Burchette has known about and wants to see him as soon as possible. She would like to have Burchette contact her Monday or for Buchette to work her in on Monday.   Please advise

## 2019-10-02 NOTE — Telephone Encounter (Signed)
Pt okay waiting for appt on Wednesday

## 2019-10-07 ENCOUNTER — Ambulatory Visit (INDEPENDENT_AMBULATORY_CARE_PROVIDER_SITE_OTHER): Payer: Medicare Other | Admitting: Family Medicine

## 2019-10-07 ENCOUNTER — Encounter: Payer: Self-pay | Admitting: Family Medicine

## 2019-10-07 ENCOUNTER — Other Ambulatory Visit: Payer: Self-pay

## 2019-10-07 VITALS — BP 112/62 | HR 76 | Temp 97.6°F | Wt 158.2 lb

## 2019-10-07 DIAGNOSIS — L821 Other seborrheic keratosis: Secondary | ICD-10-CM

## 2019-10-07 DIAGNOSIS — M7062 Trochanteric bursitis, left hip: Secondary | ICD-10-CM

## 2019-10-07 DIAGNOSIS — M79672 Pain in left foot: Secondary | ICD-10-CM | POA: Diagnosis not present

## 2019-10-07 NOTE — Patient Instructions (Signed)
Try the OTC Voltaren (Diclofenac) gel to foot and right lateral hip 3-4 times daily  Let me know if foot not improving in two weeks

## 2019-10-07 NOTE — Progress Notes (Signed)
Established Patient Office Visit  Subjective:  Patient ID: [name], female    DOB: March 12, 1941  Age: 79 y.o. MRN: [id number]  CC:  Chief Complaint  Patient presents with  . Leg Pain    Pt states left foot hurts when walking     HPI [name] presents for several items as below  We reviewed notes from last visit.  She has some dry mouth symptoms.  No evidence for diabetes.  We recommend tapering off trazodone.  However, she states she started taking trazodone earlier in the evening and this did seem to help with her insomnia.  Her dry mouth symptoms have not worsened.  No polyuria.  She complains of left foot pain.  Pain is mostly involving the mid volar surface-laterally.  No recent change of shoewear.  No specific injury.  She has not noted any swelling.  No Achilles pain.  Her pain is consistently worse with walking and not at rest.  Her bilateral leg pain from last visit has improved.  She is still having some bilateral lateral hip pains.  Left hip greater than right.  No anterior or medial hip pain.  She had injection back in April which did not seem to help much.  She has some chronic back pain but this is relatively stable.  She denies any radiculitis symptoms.  Other issue is she has a couple scaly lesions on the right side of her face.  She has upcoming wedding in July and would like to have these treated if possible.  No past history of skin cancer.  Past Medical History:  Diagnosis Date  . Anemia   . Anxiety   . Arthritis    knees  . Atrophic vaginitis   . Cancer [facility])    breast cancer - left  . Diverticulosis   . Endometriosis   . Family history of breast cancer   . Family history of colon cancer   . GERD (gastroesophageal reflux disease)   . Headache(784.0)   . Heart murmur    as a child only  . History of kidney stones    passed stones, no surgery required  . History of radiation therapy 01/30/16- 03/02/16   Left Breast 50 Gy in 25  fractions.   . Hyperlipidemia   . Hypertension   . Hypothyroidism   . Internal hemorrhoids   . Osteopenia   . Osteoporosis   . Personal history of chemotherapy   . Personal history of radiation therapy   . Rheumatic fever    age 64  . Sleep apnea    " very mild" does not wear CPAP  . Thyroid disease    Hypothyroid  . Ulcer   . UTI (lower urinary tract infection)     Past Surgical History:  Procedure Laterality Date  . APPENDECTOMY    . BREAST BIOPSY Left 08/19/2015  . BREAST LUMPECTOMY Left 09/08/2015  . BREAST LUMPECTOMY WITH RADIOACTIVE SEED AND SENTINEL LYMPH NODE BIOPSY Left 09/09/2015   Procedure: LEFT BREAST LUMPECTOMY WITH RADIOACTIVE SEED AND SENTINEL LYMPH NODE BIOPSY;  Surgeon: Jackolyn Confer, MD;  Location: [facility];  Service: General;  Laterality: Left;  . COLONOSCOPY    . DILATATION & CURETTAGE/HYSTEROSCOPY WITH MYOSURE N/A 10/09/2016   Procedure: DILATATION & CURETTAGE/HYSTEROSCOPY WITH MYOSURE;  Surgeon: Princess Bruins, MD;  Location: [facility];  Service: Gynecology;  Laterality: N/A;  requesting 7:30am in our block  requests one hour  . ESOPHAGOGASTRODUODENOSCOPY ENDOSCOPY    . IR GENERIC  HISTORICAL  12/05/2015   IR CV LINE INJECTION 12/05/2015 [name], MD [facility]  . LAPAROSCOPIC ENDOMETRIOSIS FULGURATION  1978  . PELVIC LAPAROSCOPY    . PORTACATH PLACEMENT N/A 10/04/2015   Procedure: INSERTION PORT-A-CATH WITH ULTRASOUND;  Surgeon: Jackolyn Confer, MD;  Location: [facility];  Service: General;  Laterality: N/A;  . portacath removal    . TONSILLECTOMY    . ULNAR NERVE REPAIR  2010    Family History  Problem Relation Age of Onset  . Breast cancer Mother        Age 48  . Hypertension Mother   . Heart disease Father   . Stroke Father   . Colon cancer Father        dx 54s  . Breast cancer Paternal Grandmother        Age 25  . Diabetes Paternal Grandmother   . Heart disease Maternal Grandmother     Social History   Socioeconomic History  .  Marital status: Married    Spouse name: Not on file  . Number of children: 0  . Years of education: Not on file  . Highest education level: Not on file  Occupational History  . Occupation: retired  Tobacco Use  . Smoking status: Never Smoker  . Smokeless tobacco: Never Used  Vaping Use  . Vaping Use: Never used  Substance and Sexual Activity  . Alcohol use: No  . Drug use: No  . Sexual activity: Never    Birth control/protection: Post-menopausal  Other Topics Concern  . Not on file  Social History Narrative   Retired Oncologist.    Social Determinants of Health   Financial Resource Strain:   . Difficulty of Paying Living Expenses:   Food Insecurity:   . Worried About Charity fundraiser in the Last Year:   . Arboriculturist in the Last Year:   Transportation Needs:   . Film/video editor (Medical):   Marland Kitchen Lack of Transportation (Non-Medical):   Physical Activity:   . Days of Exercise per Week:   . Minutes of Exercise per Session:   Stress:   . Feeling of Stress :   Social Connections:   . Frequency of Communication with Friends and Family:   . Frequency of Social Gatherings with Friends and Family:   . Attends Religious Services:   . Active Member of Clubs or Organizations:   . Attends Archivist Meetings:   Marland Kitchen Marital Status:   Intimate Partner Violence:   . Fear of Current or Ex-Partner:   . Emotionally Abused:   Marland Kitchen Physically Abused:   . Sexually Abused:     Outpatient Medications Prior to Visit  Medication Sig Dispense Refill  . aspirin-acetaminophen-caffeine (EXCEDRIN MIGRAINE) 250-250-65 MG tablet Take 1 tablet by mouth daily as needed for headache.    Marland Kitchen atenolol (TENORMIN) 50 MG tablet TAKE 1 TABLET BY MOUTH DAILY 30 tablet 5  . chlorhexidine (PERIDEX) 0.12 % solution Use as directed 15 mLs in the mouth or throat 2 (two) times daily.     . hydrochlorothiazide (MICROZIDE) 12.5 MG capsule TAKE 1 CAPSULE BY MOUTH EVERY DAY 30 capsule 5    . levothyroxine (SYNTHROID) 50 MCG tablet TAKE 1 TABLET (50 MCG TOTAL) BY MOUTH DAILY BEFORE BREAKFAST. 30 tablet 5  . Melatonin 3 MG TBDP Take by mouth.    Earney Navy Bicarbonate (ZEGERID) 20-1100 MG CAPS capsule Take 1 capsule by mouth daily before breakfast.    .  simvastatin (ZOCOR) 40 MG tablet TAKE 1 TABLET BY MOUTH ONCE AT BEDTIME 30 tablet 2  . tamoxifen (NOLVADEX) 20 MG tablet TAKE 1 TABLET BY MOUTH EVERY DAY 30 tablet 32  . traZODone (DESYREL) 100 MG tablet Take 1 tablet (100 mg total) by mouth at bedtime. 90 tablet 3  . TURMERIC PO Take 320 mg by mouth daily.    Marland Kitchen venlafaxine XR (EFFEXOR-XR) 150 MG 24 hr capsule TAKE 1 CAPSULE BY MOUTH EVERY DAY 30 capsule 2   Facility-Administered Medications Prior to Visit  Medication Dose Route Frequency Provider Last Rate Last Admin  . betamethasone acetate-betamethasone sodium phosphate (CELESTONE) injection 12 mg  12 mg Other Once Magnus Sinning, MD        Allergies  Allergen Reactions  . Nitrofurantoin Nausea And Vomiting    GI upset  . Sulfamethoxazole-Trimethoprim Nausea And Vomiting and Other (See Comments)    GI upset    ROS Review of Systems  Constitutional: Negative for appetite change, chills and unexpected weight change.  Respiratory: Negative for cough and shortness of breath.   Cardiovascular: Negative for chest pain.  Musculoskeletal: Positive for back pain.  Hematological: Negative for adenopathy.      Objective:    Physical Exam Vitals reviewed.  Constitutional:      Appearance: Normal appearance.  Cardiovascular:     Rate and Rhythm: Normal rate and regular rhythm.  Pulmonary:     Effort: Pulmonary effort is normal.     Breath sounds: Normal breath sounds.  Musculoskeletal:     Comments: Left foot reveals no edema.  She has some mild tenderness along the mid aspect of the plantar fascia laterally.  There is no metatarsal tenderness.  No Achilles tenderness.  Full range of motion ankle.  No bony  tenderness on the dorsal aspect of the foot.  Distal pulses normal  She has excellent range of motion of both hips.  She has tenderness over the greater trochanteric bursa left hip greater than right  Skin:    Comments: She has a couple of brownish slightly elevated scaly well-demarcated lesions including 1 right malar area and second 1 which is just inferior to the right ear.  These are consistent with benign appearing seborrheic keratoses  Neurological:     Mental Status: She is alert.     BP 112/62 (BP Location: Left Arm, Patient Position: Sitting, Cuff Size: Normal)   Pulse 76   Temp 97.6 F (36.4 C) (Temporal)   Wt 158 lb 3.2 oz (71.8 kg)   SpO2 96%   BMI 30.90 kg/m  Wt Readings from Last 3 Encounters:  10/07/19 158 lb 3.2 oz (71.8 kg)  09/23/19 156 lb 12.8 oz (71.1 kg)  08/11/19 157 lb 4.8 oz (71.4 kg)     Health Maintenance Due  Topic Date Due  . Hepatitis C Screening  Never done  . PAP SMEAR-Modifier  09/13/2018    There are no preventive care reminders to display for this patient.  Lab Results  Component Value Date   TSH 2.14 08/11/2019   Lab Results  Component Value Date   WBC 4.9 03/26/2018   HGB 11.6 (L) 03/26/2018   HCT 37.6 03/26/2018   MCV 85.5 03/26/2018   PLT 214 03/26/2018   Lab Results  Component Value Date   NA 142 08/11/2019   K 4.2 08/11/2019   CHLORIDE 103 01/03/2017   CO2 31 08/11/2019   GLUCOSE 100 (H) 08/11/2019   BUN 34 (H) 08/11/2019   CREATININE  1.05 08/11/2019   BILITOT 0.3 08/11/2019   ALKPHOS 66 08/11/2019   AST 18 08/11/2019   ALT 17 08/11/2019   PROT 6.7 08/11/2019   ALBUMIN 4.3 08/11/2019   CALCIUM 9.4 08/11/2019   ANIONGAP 11 03/26/2018   EGFR 46 (L) 01/03/2017   GFR 50.52 (L) 08/11/2019   Lab Results  Component Value Date   CHOL 143 08/11/2019   Lab Results  Component Value Date   HDL 47.60 08/11/2019   No results found for: [facility] Lab Results  Component Value Date   TRIG 218.0 (H) 08/11/2019   Lab  Results  Component Value Date   CHOLHDL 3 08/11/2019   Lab Results  Component Value Date   HGBA1C 5.8 11/15/2014      Assessment & Plan:   #1 left lateral foot pain.  This would appear to be coming more from the plantar fascia.  No bony tenderness.  Low clinical suspicion for stress fracture.  This is worse with weightbearing and walking which suggest more likely musculoskeletal.  Does not sound neuropathic  -We recommend trial over-the-counter Voltaren gel 3-4 times daily -Good supportive shoe wear -If not improving in a couple weeks consider referral to podiatry  #2 seborrheic keratoses right side of face.  Patient requesting treatment. -Discussed risk and benefits of liquid nitrogen treatment including risk of pain, blistering, low risk of infection and patient consents -Both lesions as described above were treated without difficulty.  She is aware this may take up to 2 weeks for these to scale off  #3 left lateral hip pain.  Suspect greater trochanteric bursitis. Patient requesting repeat injection. -We discussed risk and benefits including risk of bruising, bleeding, low risk of infection and patient consented.  Skin prepped with Betadine.  Using sterile technique and 25-gauge 1 and 1/2 inch needle injected Depo-Medrol 80 mg and 2 cc of plain Xylocaine into the left lateral hip over bursa region.  Patient tolerated well. -Consider orthopedic referral if not improved over the next week or 2  No orders of the defined types were placed in this encounter.   Follow-up: No follow-ups on file.    [name], MD

## 2019-10-25 ENCOUNTER — Other Ambulatory Visit: Payer: Self-pay | Admitting: Family Medicine

## 2019-11-09 ENCOUNTER — Telehealth: Payer: Self-pay | Admitting: Family Medicine

## 2019-11-09 NOTE — Telephone Encounter (Signed)
FYI  Pt is calling wanting Dr. Elease Hashimoto to know that her 5 week pain in her leg is fully gone and thank you so much for everything.

## 2019-11-09 NOTE — Telephone Encounter (Signed)
Noted  

## 2019-11-09 NOTE — Telephone Encounter (Signed)
fyi

## 2019-12-11 ENCOUNTER — Telehealth: Payer: Self-pay | Admitting: Family Medicine

## 2019-12-11 NOTE — Progress Notes (Signed)
°  Chronic Care Management   Outreach Note  12/11/2019 Name: [name] MRN: [id number] DOB: March 18, 1941  Referred by: [name], MD Reason for referral : No chief complaint on file.   An unsuccessful telephone outreach was attempted today. The patient was referred to the pharmacist for assistance with care management and care coordination.   Follow Up Plan:   Carley Perdue [employer] Scheduler

## 2019-12-25 ENCOUNTER — Other Ambulatory Visit: Payer: Self-pay | Admitting: Family Medicine

## 2019-12-30 ENCOUNTER — Telehealth: Payer: Self-pay | Admitting: Family Medicine

## 2019-12-30 NOTE — Progress Notes (Signed)
  Chronic Care Management   Note  12/30/2019 Name: [name] MRN: [id number] DOB: 08/18/1940  [name] is a 79 y.o. year old female who is a primary care patient of Burchette, Alinda Sierras, MD. I reached out to [place] by phone today in response to a referral sent by [name]'s PCP, [name], MD.   [name] was given information about Chronic Care Management services today including:  1. CCM service includes personalized support from designated clinical staff supervised by her physician, including individualized plan of care and coordination with other care providers 2. 24/7 contact phone numbers for assistance for urgent and routine care needs. 3. Service will only be billed when office clinical staff spend 20 minutes or more in a month to coordinate care. 4. Only one practitioner may furnish and bill the service in a calendar month. 5. The patient may stop CCM services at any time (effective at the end of the month) by phone call to the office staff.   Patient agreed to services and verbal consent obtained.   Follow up plan:   [name] [employer] Scheduler

## 2020-01-23 ENCOUNTER — Other Ambulatory Visit: Payer: Self-pay | Admitting: Family Medicine

## 2020-02-12 ENCOUNTER — Telehealth: Payer: Self-pay | Admitting: Pharmacist

## 2020-02-12 NOTE — Chronic Care Management (AMB) (Signed)
Chronic Care Management Pharmacy Assistant   Name: [name]  MRN: [id number] DOB: 12/13/1940  Reason for Encounter:  Medication Review/Initial Questions for Pharmacist visit on 02-15-2020  Patient Questions: 1. Have you seen any other providers since your last visit? No 2. Any changes in your medications or health? No 3. Any side effects from any medications? No 4. Do you have any symptoms or problems not managed by your medications? No 5. Any concerns about your health right now? Yes, she has one medication that she takes with her morning medication, she does not take it in the morning she states that she is very irritable. She is not sure which medication it is. 6. Has your provider asked that you check blood pressure, blood sugar, or follow a special diet at home? No 7. Do you get any type of exercise regularly? No 8. Can you think of a goal you would like to reach for your health?  She would like to begin walking again at least two to three times a week. She's unable to right now because of some leg and feet pain 9. Do you have any problems getting your medications? No 10. Is there anything that you would like to discuss during the appointment? No  The patient was asked to bring all her medications and supplements to her appointment. She verbally confirmed that she understood.   PCP : [name], MD  Allergies:   Allergies  Allergen Reactions  . Nitrofurantoin Nausea And Vomiting    GI upset  . Sulfamethoxazole-Trimethoprim Nausea And Vomiting and Other (See Comments)    GI upset    Medications: Outpatient Encounter Medications as of 02/12/2020  Medication Sig  . aspirin-acetaminophen-caffeine (EXCEDRIN MIGRAINE) 250-250-65 MG tablet Take 1 tablet by mouth daily as needed for headache.  Marland Kitchen atenolol (TENORMIN) 50 MG tablet TAKE 1 TABLET BY MOUTH DAILY  . chlorhexidine (PERIDEX) 0.12 % solution Use as directed 15 mLs in the mouth or throat 2 (two) times  daily.   . hydrochlorothiazide (MICROZIDE) 12.5 MG capsule TAKE 1 CAPSULE BY MOUTH EVERY DAY  . levothyroxine (SYNTHROID) 50 MCG tablet TAKE 1 TABLET (50 MCG TOTAL) BY MOUTH DAILY BEFORE BREAKFAST.  . Melatonin 3 MG TBDP Take by mouth.  Earney Navy Bicarbonate (ZEGERID) 20-1100 MG CAPS capsule Take 1 capsule by mouth daily before breakfast.  . simvastatin (ZOCOR) 40 MG tablet TAKE 1 TABLET BY MOUTH ONCE AT BEDTIME  . tamoxifen (NOLVADEX) 20 MG tablet TAKE 1 TABLET BY MOUTH EVERY DAY  . traZODone (DESYREL) 100 MG tablet Take 1 tablet (100 mg total) by mouth at bedtime.  . TURMERIC PO Take 320 mg by mouth daily.  Marland Kitchen venlafaxine XR (EFFEXOR-XR) 150 MG 24 hr capsule TAKE 1 CAPSULE BY MOUTH EVERY DAY   Facility-Administered Encounter Medications as of 02/12/2020  Medication  . betamethasone acetate-betamethasone sodium phosphate (CELESTONE) injection 12 mg    Current Diagnosis: Patient Active Problem List   Diagnosis Date Noted  . Chronic midline low back pain with bilateral sciatica 11/06/2016  . Genetic testing 09/23/2015  . Malignant neoplasm of upper-outer quadrant of left breast in female, estrogen receptor positive ([place]) 09/07/2015  . Family history of breast cancer   . Family history of colon cancer   . Prediabetes 08/30/2014  . Endometriosis   . Osteoporosis   . Hypertension   . UTI (lower urinary tract infection) 03/05/2011  . Dysuria 03/05/2011  . Hypothyroidism 06/06/2009  . FATIGUE 06/06/2009  . ELEVATED  BLOOD PRESSURE 06/06/2009  . Disorder of bone and cartilage 09/09/2008  . SCOLIOSIS 09/09/2008  . Hyperlipidemia 07/30/2008  . Migraine headache 07/30/2008  . GERD 07/30/2008  . ENTHESOPATHY OF HIP REGION 07/30/2008  . HEADACHE 07/30/2008    Goals Addressed   None     Follow-Up:  Pharmacist Review   Maia Breslow, [place] Assistant [phone]

## 2020-02-15 ENCOUNTER — Other Ambulatory Visit: Payer: Self-pay

## 2020-02-15 ENCOUNTER — Encounter: Payer: Self-pay | Admitting: Family Medicine

## 2020-02-15 ENCOUNTER — Ambulatory Visit (INDEPENDENT_AMBULATORY_CARE_PROVIDER_SITE_OTHER): Payer: Medicare Other | Admitting: Pharmacist

## 2020-02-15 ENCOUNTER — Telehealth: Payer: Self-pay

## 2020-02-15 DIAGNOSIS — I10 Essential (primary) hypertension: Secondary | ICD-10-CM

## 2020-02-15 DIAGNOSIS — E038 Other specified hypothyroidism: Secondary | ICD-10-CM

## 2020-02-15 DIAGNOSIS — Z23 Encounter for immunization: Secondary | ICD-10-CM | POA: Diagnosis not present

## 2020-02-15 NOTE — Patient Instructions (Addendum)
Hi [name]!  It was great to get to meet you this week! As a recap, some of the main things we talked about were:  1. Try Tylenol (Acetaminophen) only to see if this will help with headaches and prevent rebound headaches from Excedrin. You can try ibuprofen (Advil) every once in a while for headaches but I would try to rely on Tylenol if possible.  2. Don't forget to supplement with [phone] units of vitamin D daily and 1200 mg of calcium daily from dietary and supplemental sources to strengthen your bones.  3. Make sure to check your blood pressure a couple of times each month to make sure your medications are working.   4. You have an appointment with Dr. Elease Hashimoto to discuss switching from Venlafaxine to another anti depressant with a lot lower risk of not feeling well with a missed dose as well as an alternative for sleep medicine.  Please give me a call if you need anything before our next touch base!  Best, Maddie  [name], PharmD Clinical Pharmacist [facility]    Visit Information  Goals Addressed            This Visit's Progress   . Pharmacy Care Plan       CARE PLAN ENTRY (see longitudinal plan of care for additional care plan information)  Current Barriers:  . Chronic Disease Management support, education, and care coordination needs related to Hypertension, Hyperlipidemia, GERD, Hypothyroidism, Depression, Osteopenia, and migraines, insomnia, breast cancer   Hypertension BP Readings from Last 3 Encounters:  10/07/19 112/62  09/23/19 124/76  08/11/19 138/76   . Pharmacist Clinical Goal(s): o Over the next 90 days, patient will work with PharmD and providers to maintain BP goal <140/90 . Current regimen:  . Atenolol 50 mg 1 tablet daily . Hydrochlorothiazide 12.5 mg 1 capsule daily . Interventions: o Discussed DASH eating plan recommendations: . Emphasizes vegetables, fruits, and whole-grains . Includes fat-free or  low-fat dairy products, fish, poultry, beans, nuts, and vegetable oils . Limits foods that are high in saturated fat. These foods include fatty meats, full-fat dairy products, and tropical oils such as coconut, palm kernel, and palm oils. . Limits sugar-sweetened beverages and sweets . Limiting sodium intake to < 1500 mg/day o Discussed recommendations for moderate aerobic exercise for 150 minutes/week spread out over 5 days for heart healthy lifestyle  . Patient self care activities - Over the next 90 days, patient will: o Check blood pressure weekly, document, and provide at future appointments o Ensure daily salt intake < 2300 mg/day  Hyperlipidemia Lab Results  Component Value Date/Time   LDLDIRECT 61.0 08/11/2019 03:18 PM   . Pharmacist Clinical Goal(s): o Over the next 90days, patient will work with PharmD and providers to maintain LDL goal < 100 . Current regimen:  o Simvastatin 40 mg 1 tablet at bedtime . Interventions: o Discussed how triglycerides can increase when: . Eating eat too much food . Eating high-fat foods such as fried foods, red meat, chicken skin, egg yolks, high-fat dairy, butter, lard, shortening, margarine, and fast food . Eating foods high in simple carbohydrates such as fresh and canned fruit, candy, ice cream and sweetened yogurt, sweetened drinks like juices, cereal, jams, foods and drinks with corn syrup, or sugar listed as the first ingredient . Drinking alcohol  . Patient self care activities - Over the next 90 days, patient will: o Continue current medications  Hypothyroidism Lab Results  Component Value Date  TSH 2.14 08/11/2019   . Pharmacist Clinical Goal(s): o Over the next 90 days, patient will work with PharmD and providers to maintain TSH between 0.4 - 4.5 . Current regimen:  o Levothyroxine 50 mcg 1 tablet daily before breakfast . Interventions: o Discussed consistent administration on an empty stomach with a full glass of water  separated from other medications . Patient self care activities - Over the next 90 days, patient will: o Continue current medications  GERD . Pharmacist Clinical Goal(s) o Over the next 90 days, patient will work with PharmD and providers to minimize symptoms of heart burn / indigestion . Current regimen:  o Zegerid 20-1100 mg 1 capsule daily before breakfast . Interventions: o Discussed long term risks of taking Zegerid and will consider taper in the future . Patient self care activities - Over the next 90 days, patient will: o Continue current medication  Migraines . Pharmacist Clinical Goal(s) o Over the next 30 days, patient will work with PharmD and providers to prevent and treat migraines . Current regimen:  o Excedrin 250-250-65 mg 1 tablet daily as needed . Interventions: o Discussed effects of taking Excedrin every day and the risk for medication overuse headaches . Patient self care activities - Over the next 30 days, patient will: o Try Tylenol (Acetaminophen) only to see if this will help with headaches o Can consider ibuprofen (Advil) every once in a while for headaches  Breast cancer . Pharmacist Clinical Goal(s) o Over the next 90 days, patient will work with PharmD and providers to continue to treat breast cancer . Current regimen:  o Tamoxifen 20 mg 1 tablet daily . Patient self care activities - Over the next 90 days, patient will: o Continue current medications  Melatonin . Pharmacist Clinical Goal(s) o Over the next 90 days, patient will work with PharmD and providers to improve sleep . Current regimen:  . Melatonin 3 mg 1 tablet at bedtime . Trazodone 100 mg 1 tablet at bedtime . Interventions: o Discussed practicing good sleep hygiene by setting a sleep schedule and maintaining it, avoid excessive napping, following a nightly routine, avoiding screen time for 30-60 minutes before going to bed, and making the bedroom a cool, quiet and dark space . Patient  self care activities - Over the next 30 days, patient will: o Continue current medications  Depression/anxiety . Pharmacist Clinical Goal(s) o Over the next 30 days, patient will work with PharmD and providers to manage symptoms of depression and anxiety . Current regimen:  o Venlafaxine XR 150 mg 1 capsule daily . Interventions: o Discussed other medications that do not have the same risk of mood swings if one dose is missed . Patient self care activities - Over the next 30 days, patient will: o Continue current medication  Osteopenia . Pharmacist Clinical Goal(s) o Over the next 90 days, patient will work with PharmD and providers to support bone health . Current regimen:  o No medications . Interventions: o Discussed recommended [phone] units of vitamin D daily and 1200 mg of calcium daily from dietary and supplemental sources. o Discussed recommended weight-bearing and muscle strengthening exercises for building and maintaining bone density . Patient self care activities - Over the next 90 days, patient will: o Supplement with [phone] units of vitamin D daily and ensure she is getting the recommended 1200 mg of calcium through diet and supplementation  Medication management . Pharmacist Clinical Goal(s): o Over the next 90 days, patient will work with PharmD  and providers to maintain optimal medication adherence . Current pharmacy: [facility] . Interventions o Comprehensive medication review performed. o Continue current medication management strategy . Patient self care activities - Over the next 90 days, patient will: o Take medications as prescribed o Report any questions or concerns to PharmD and/or provider(s)  Initial goal documentation        Ms. Alsteen was given information about Chronic Care Management services today including:  1. CCM service includes personalized support from designated clinical staff supervised by her physician, including  individualized plan of care and coordination with other care providers 2. 24/7 contact phone numbers for assistance for urgent and routine care needs. 3. Standard insurance, coinsurance, copays and deductibles apply for chronic care management only during months in which we provide at least 20 minutes of these services. Most insurances cover these services at 100%, however patients may be responsible for any copay, coinsurance and/or deductible if applicable. This service may help you avoid the need for more expensive face-to-face services. 4. Only one practitioner may furnish and bill the service in a calendar month. 5. The patient may stop CCM services at any time (effective at the end of the month) by phone call to the office staff.  Patient agreed to services and verbal consent obtained.   The patient verbalized understanding of instructions provided today and agreed to receive a mailed copy of patient instruction and/or educational materials. Telephone follow up appointment with pharmacy team member scheduled for: 3 months  Insomnia Insomnia is a sleep disorder that makes it difficult to fall asleep or stay asleep. Insomnia can cause fatigue, low energy, difficulty concentrating, mood swings, and poor performance at work or school. There are three different ways to classify insomnia:  Difficulty falling asleep.  Difficulty staying asleep.  Waking up too early in the morning. Any type of insomnia can be long-term (chronic) or short-term (acute). Both are common. Short-term insomnia usually lasts for three months or less. Chronic insomnia occurs at least three times a week for longer than three months. What are the causes? Insomnia may be caused by another condition, situation, or substance, such as:  Anxiety.  Certain medicines.  Gastroesophageal reflux disease (GERD) or other gastrointestinal conditions.  Asthma or other breathing conditions.  Restless legs syndrome, sleep apnea,  or other sleep disorders.  Chronic pain.  Menopause.  Stroke.  Abuse of alcohol, tobacco, or illegal drugs.  Mental health conditions, such as depression.  Caffeine.  Neurological disorders, such as Alzheimer's disease.  An overactive thyroid (hyperthyroidism). Sometimes, the cause of insomnia may not be known. What increases the risk? Risk factors for insomnia include:  Gender. Women are affected more often than men.  Age. Insomnia is more common as you get older.  Stress.  Lack of exercise.  Irregular work schedule or working night shifts.  Traveling between different time zones.  Certain medical and mental health conditions. What are the signs or symptoms? If you have insomnia, the main symptom is having trouble falling asleep or having trouble staying asleep. This may lead to other symptoms, such as:  Feeling fatigued or having low energy.  Feeling nervous about going to sleep.  Not feeling rested in the morning.  Having trouble concentrating.  Feeling irritable, anxious, or depressed. How is this diagnosed? This condition may be diagnosed based on:  Your symptoms and medical history. Your health care provider may ask about: ? Your sleep habits. ? Any medical conditions you have. ? Your mental health.  A physical exam. How is this treated? Treatment for insomnia depends on the cause. Treatment may focus on treating an underlying condition that is causing insomnia. Treatment may also include:  Medicines to help you sleep.  Counseling or therapy.  Lifestyle adjustments to help you sleep better. Follow these instructions at home: Eating and drinking   Limit or avoid alcohol, caffeinated beverages, and cigarettes, especially close to bedtime. These can disrupt your sleep.  Do not eat a large meal or eat spicy foods right before bedtime. This can lead to digestive discomfort that can make it hard for you to sleep. Sleep habits   Keep a sleep  diary to help you and your health care provider figure out what could be causing your insomnia. Write down: ? When you sleep. ? When you wake up during the night. ? How well you sleep. ? How rested you feel the next day. ? Any side effects of medicines you are taking. ? What you eat and drink.  Make your bedroom a dark, comfortable place where it is easy to fall asleep. ? Put up shades or blackout curtains to block light from outside. ? Use a white noise machine to block noise. ? Keep the temperature cool.  Limit screen use before bedtime. This includes: ? Watching TV. ? Using your smartphone, tablet, or computer.  Stick to a routine that includes going to bed and waking up at the same times every day and night. This can help you fall asleep faster. Consider making a quiet activity, such as reading, part of your nighttime routine.  Try to avoid taking naps during the day so that you sleep better at night.  Get out of bed if you are still awake after 15 minutes of trying to sleep. Keep the lights down, but try reading or doing a quiet activity. When you feel sleepy, go back to bed. General instructions  Take over-the-counter and prescription medicines only as told by your health care provider.  Exercise regularly, as told by your health care provider. Avoid exercise starting several hours before bedtime.  Use relaxation techniques to manage stress. Ask your health care provider to suggest some techniques that may work well for you. These may include: ? Breathing exercises. ? Routines to release muscle tension. ? Visualizing peaceful scenes.  Make sure that you drive carefully. Avoid driving if you feel very sleepy.  Keep all follow-up visits as told by your health care provider. This is important. Contact a health care provider if:  You are tired throughout the day.  You have trouble in your daily routine due to sleepiness.  You continue to have sleep problems, or your sleep  problems get worse. Get help right away if:  You have serious thoughts about hurting yourself or someone else. If you ever feel like you may hurt yourself or others, or have thoughts about taking your own life, get help right away. You can go to your nearest emergency department or call:  Your local emergency services (911 in the [place]  A suicide crisis helpline, such as the [facility] at (936) 845-0668. This is open 24 hours a day. Summary  Insomnia is a sleep disorder that makes it difficult to fall asleep or stay asleep.  Insomnia can be long-term (chronic) or short-term (acute).  Treatment for insomnia depends on the cause. Treatment may focus on treating an underlying condition that is causing insomnia.  Keep a sleep diary to help you and your health care provider  figure out what could be causing your insomnia. This information is not intended to replace advice given to you by your health care provider. Make sure you discuss any questions you have with your health care provider. Document Revised: 03/15/2017 Document Reviewed: 01/10/2017 [employer] Patient Education  2020 [employer].

## 2020-02-15 NOTE — Chronic Care Management (AMB) (Signed)
Chronic Care Management Pharmacy  Name: [name]  MRN: [id number] DOB: [birth year]  Initial Planning Appointment: completed 02/12/20  Initial Questions: 1. Have you seen any other providers since your last visit? n/a 2. Any changes in your medicines or health? Yes   Chief Complaint/ HPI  [name],  79 y.o. , female presents for their Initial CCM visit with the clinical pharmacist In office.  PCP : [name], MD  Their chronic conditions include: HTN, HLD, hypothyroidism, GERD, migraines, breast cancer, insomnia, depression, osteopenia  Office Visits: -10/07/19 [name], MD: Patient presented for left foot pain and bilateral hip pain. Recommended trial of Voltaren gel 3-4 times daily. Seborrhic keratoses on right side of face treated with liquid nitrogen.  Steroid injection given into left hip.   -09/23/19 [name], MD: Patient presented for leg pain for 2 weeks. Patient reports foot pain is worse with walking. Patient also reports dry mouth and trazodone was recently increased to 100 mg but she does not feel as though it has helped with her sleep. Referral placed for orthopedic surgery.  Consult Visit: -06/01/19 Lurline Del, MD (oncology): Patient no showed for her appointment.  -10/30/202 Jola Schmidt (ophthalmology): Unable to access notes.  -11/27/18 Cassandra Heilingoetter, PA-C (oncology): Patient presented for follow up for breast cancer. She continues on Tamoxifen and is doing well. Patient is due for repeat mammogram 03/2019. Follow up in Feb 2021.  Medications: Outpatient Encounter Medications as of 02/15/2020  Medication Sig  . aspirin-acetaminophen-caffeine (EXCEDRIN MIGRAINE) 250-250-65 MG tablet Take 1 tablet by mouth daily as needed for headache.  Marland Kitchen atenolol (TENORMIN) 50 MG tablet TAKE 1 TABLET BY MOUTH DAILY  . chlorhexidine (PERIDEX) 0.12 % solution Use as directed 15 mLs in the mouth or throat 2 (two) times daily.    . hydrochlorothiazide (MICROZIDE) 12.5 MG capsule TAKE 1 CAPSULE BY MOUTH EVERY DAY  . levothyroxine (SYNTHROID) 50 MCG tablet TAKE 1 TABLET (50 MCG TOTAL) BY MOUTH DAILY BEFORE BREAKFAST.  . Melatonin 3 MG TBDP Take by mouth.  Earney Navy Bicarbonate (ZEGERID) 20-1100 MG CAPS capsule Take 1 capsule by mouth daily before breakfast.  . simvastatin (ZOCOR) 40 MG tablet TAKE 1 TABLET BY MOUTH ONCE AT BEDTIME  . tamoxifen (NOLVADEX) 20 MG tablet TAKE 1 TABLET BY MOUTH EVERY DAY  . traZODone (DESYREL) 100 MG tablet Take 1 tablet (100 mg total) by mouth at bedtime.  . TURMERIC PO Take 500 mg by mouth daily.   Marland Kitchen venlafaxine XR (EFFEXOR-XR) 150 MG 24 hr capsule TAKE 1 CAPSULE BY MOUTH EVERY DAY   Facility-Administered Encounter Medications as of 02/15/2020  Medication  . 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 She has one son that in [place] and she sees him most Saturdays.  She is limited in her ability to exercise and move around a lot due to her hip and spine pain and has not walked much since spinal operation.  Patient enjoys cooking but does not do it often. Her and her husband go out to eat more and husband is limited in what he can eat due to diabetes and kidney problems. Patient reports she does the cleaning as her husband forgets to help clean but he does his own laundry and she does her own laundry.  Patient does not drive much anymore as she had "tiny accidents" and her husband does not want her  to drive.  Patient does not sleep well and mainly has trouble falling asleep,. She tries to avoid napping but ends up napping both days as she doesn't feel rested. She does not feel as though the trazodone is helping.  Patient denies problems with her current medications except venlafaxine as she notices a significant mood change if she forgets to take it.    Current Diagnosis/Assessment:  Goals Addressed            This Visit's Progress   . Pharmacy Care Plan       CARE PLAN ENTRY (see longitudinal plan of care for additional care plan information)  Current Barriers:  . Chronic Disease Management support, education, and care coordination needs related to Hypertension, Hyperlipidemia, GERD, Hypothyroidism, Depression, Osteopenia, and migraines, insomnia, breast cancer   Hypertension BP Readings from Last 3 Encounters:  10/07/19 112/62  09/23/19 124/76  08/11/19 138/76   . Pharmacist Clinical Goal(s): o Over the next 90 days, patient will work with PharmD and providers to maintain BP goal <140/90 . Current regimen:  . Atenolol 50 mg 1 tablet daily . Hydrochlorothiazide 12.5 mg 1 capsule daily . Interventions: o Discussed DASH eating plan recommendations: . Emphasizes vegetables, fruits, and whole-grains . Includes fat-free or low-fat dairy products, fish, poultry, beans, nuts, and vegetable oils . Limits foods that are high in saturated fat. These foods include fatty meats, full-fat dairy products, and tropical oils such as coconut, palm kernel, and palm oils. . Limits sugar-sweetened beverages and sweets . Limiting sodium intake to < 1500 mg/day o Discussed recommendations for moderate aerobic exercise for 150 minutes/week spread out over 5 days for heart healthy lifestyle  . Patient self care activities - Over the next 90 days, patient will: o Check blood pressure weekly, document, and provide at future appointments o Ensure daily salt intake < 2300 mg/day  Hyperlipidemia Lab Results  Component Value Date/Time   LDLDIRECT 61.0 08/11/2019 03:18 PM   . Pharmacist Clinical Goal(s): o Over the next 90days, patient will work with PharmD and providers to maintain LDL goal < 100 . Current regimen:  o Simvastatin 40 mg 1 tablet at bedtime . Interventions: o Discussed how triglycerides can increase when: . Eating eat too much  food . Eating high-fat foods such as fried foods, red meat, chicken skin, egg yolks, high-fat dairy, butter, lard, shortening, margarine, and fast food . Eating foods high in simple carbohydrates such as fresh and canned fruit, candy, ice cream and sweetened yogurt, sweetened drinks like juices, cereal, jams, foods and drinks with corn syrup, or sugar listed as the first ingredient . Drinking alcohol  . Patient self care activities - Over the next 90 days, patient will: o Continue current medications  Hypothyroidism Lab Results  Component Value Date   TSH 2.14 08/11/2019   . Pharmacist Clinical Goal(s): o Over the next 90 days, patient will work with PharmD and providers to maintain TSH between 0.4 - 4.5 . Current regimen:  o Levothyroxine 50 mcg 1 tablet daily before breakfast . Interventions: o Discussed consistent administration on an empty stomach with a full glass of water separated from other medications . Patient self care activities - Over the next 90 days, patient will: o Continue current medications  GERD . Pharmacist Clinical Goal(s) o Over the next 90 days, patient will work with PharmD and providers to minimize symptoms of heart burn / indigestion . Current regimen:  o Zegerid 20-1100 mg 1 capsule daily before breakfast . Interventions: o  Discussed long term risks of taking Zegerid and will consider taper in the future . Patient self care activities - Over the next 90 days, patient will: o Continue current medication  Migraines . Pharmacist Clinical Goal(s) o Over the next 30 days, patient will work with PharmD and providers to prevent and treat migraines . Current regimen:  o Excedrin 250-250-65 mg 1 tablet daily as needed . Interventions: o Discussed effects of taking Excedrin every day and the risk for medication overuse headaches . Patient self care activities - Over the next 30 days, patient will: o Try Tylenol (Acetaminophen) only to see if this will help  with headaches o Can consider ibuprofen (Advil) every once in a while for headaches  Breast cancer . Pharmacist Clinical Goal(s) o Over the next 90 days, patient will work with PharmD and providers to continue to treat breast cancer . Current regimen:  o Tamoxifen 20 mg 1 tablet daily . Patient self care activities - Over the next 90 days, patient will: o Continue current medications  Melatonin . Pharmacist Clinical Goal(s) o Over the next 90 days, patient will work with PharmD and providers to improve sleep . Current regimen:  . Melatonin 3 mg 1 tablet at bedtime . Trazodone 100 mg 1 tablet at bedtime . Interventions: o Discussed practicing good sleep hygiene by setting a sleep schedule and maintaining it, avoid excessive napping, following a nightly routine, avoiding screen time for 30-60 minutes before going to bed, and making the bedroom a cool, quiet and dark space . Patient self care activities - Over the next 30 days, patient will: o Continue current medications  Depression/anxiety . Pharmacist Clinical Goal(s) o Over the next 30 days, patient will work with PharmD and providers to manage symptoms of depression and anxiety . Current regimen:  o Venlafaxine XR 150 mg 1 capsule daily . Interventions: o Discussed other medications that do not have the same risk of mood swings if one dose is missed . Patient self care activities - Over the next 30 days, patient will: o Continue current medication  Osteopenia . Pharmacist Clinical Goal(s) o Over the next 90 days, patient will work with PharmD and providers to support bone health . Current regimen:  o No medications . Interventions: o Discussed recommended [phone] units of vitamin D daily and 1200 mg of calcium daily from dietary and supplemental sources. o Discussed recommended weight-bearing and muscle strengthening exercises for building and maintaining bone density . Patient self care activities - Over the next 90 days,  patient will: o Supplement with [phone] units of vitamin D daily and ensure she is getting the recommended 1200 mg of calcium through diet and supplementation  Medication management . Pharmacist Clinical Goal(s): o Over the next 90 days, patient will work with PharmD and providers to maintain optimal medication adherence . [facility] . Interventions o Comprehensive medication review performed. o Continue current medication management strategy . Patient self care activities - Over the next 90 days, patient will: o Take medications as prescribed o Report any questions or concerns to PharmD and/or provider(s)  Initial goal documentation        Hypertension   BP goal is:  <140/90  Office blood pressures are  BP Readings from Last 3 Encounters:  10/07/19 112/62  09/23/19 124/76  08/11/19 138/76   Patient checks BP at home never Patient home BP readings are ranging: n/a  Patient has failed these meds in the past: none Patient is currently  controlled on the following medications:  . Atenolol 50 mg 1 tablet daily in AM . HCTZ 12.5 mg 1 capsule daily in AM  We discussed diet and exercise extensively  -DASH eating plan recommendations: . Emphasizes vegetables, fruits, and whole-grains . Includes fat-free or low-fat dairy products, fish, poultry, beans, nuts, and vegetable oils . Limits foods that are high in saturated fat. These foods include fatty meats, full-fat dairy products, and tropical oils such as coconut, palm kernel, and palm oils. . Limits sugar-sweetened beverages and sweets . Limiting sodium intake to < 1500 mg/day -Diet: patient does not use a lot of salt when cooking but does note the saltiness of food when eating out; encouraged her to check nutrition labels when eating out for sodium content -Discussed recommendations for moderate aerobic exercise for 150 minutes/week spread out over 5 days for heart healthy lifestyle  Plan  Continue  current medications     Hyperlipidemia   LDL goal < 100  Last lipids Lab Results  Component Value Date   CHOL 143 08/11/2019   HDL 47.60 08/11/2019   LDLDIRECT 61.0 08/11/2019   TRIG 218.0 (H) 08/11/2019   CHOLHDL 3 08/11/2019   Hepatic Function Latest Ref Rng & Units 08/11/2019 03/26/2018 02/10/2018  Total Protein 6.0 - 8.3 g/dL 6.7 7.0 6.7  Albumin 3.5 - 5.2 g/dL 4.3 3.9 4.3  AST 0 - 37 U/L _0 ALT 0 - 35 U/L _1 Alk Phosphatase 39 - 117 U/L 66 91 137(H)  Total Bilirubin 0.2 - 1.2 mg/dL 0.3 0.2(L) 0.4  Bilirubin, Direct 0.0 - 0.3 mg/dL 0.1 - -     The 10-year ASCVD risk score Mikey Bussing DC Jr., et al., 2013) is: 24.7%   Values used to calculate the score:     Age: 42 years     Sex: Female     Is Non-Hispanic African American: No     Diabetic: No     Tobacco smoker: No     Systolic Blood Pressure: 462 mmHg     Is BP treated: Yes     HDL Cholesterol: 47.6 mg/dL     Total Cholesterol: 143 mg/dL   Patient has failed these meds in past: none Patient is currently uncontrolled (triglycerides) on the following medications:  . Simvastatin 40 mg 1 tablet at bedtime  We discussed:  diet and exercise extensively  -Discussed how triglycerides can increase when: . Eating eat too much food (patient admits to some over eating) . Eating high-fat foods such as fried foods, red meat, chicken skin, egg yolks, high-fat dairy, butter, lard, shortening, margarine, and fast food . Eating foods high in simple carbohydrates such as fresh and canned fruit, candy, ice cream and sweetened yogurt, sweetened drinks like juices, cereal, jams, foods and drinks with corn syrup, or sugar listed as the first ingredient . Drinking alcohol   Plan  Continue current medications  Hypothyroidism   Lab Results  Component Value Date/Time   TSH 2.14 08/11/2019 03:18 PM   TSH 0.72 02/10/2018 11:45 AM    Patient has failed these meds in past: none Patient is currently controlled on the  following medications:  . Levothyroxine 50 mcg 1 tablet daily before breakfast  We discussed:  consistent administration with water prior to food and other medications  Plan  Continue current medications  GERD   Patient has failed these meds in past: none Patient is currently controlled on the following medications:  . Zegerid 20-1100 mg 1  capsule daily before breakfast   We discussed:  Long term risks of PPI - patient reports she has gotten ulcers in the past from taking NSAIDs and takes to protect her stomach while taking Excedrin    Plan Will try to taper off depending on response to alternative medications for migraines. Continue current medications   Migraines   Patient has failed these meds in past: none Patient is currently controlled on the following medications:  . Excedrin 250-250-65 mg 1 tablet daily PRN   We discussed: discussed how not taking the medication can cause rebound headaches; recommended trying Tylenol only first for headaches to see if this helps  Plan Find a safer alternative to avoid rebound headaches with Excedrin. Continue current medications   Breast cancer   Patient is currently controlled on the following medications:  . Tamoxifen 20 mg 1 tablet daily  Plan  Continue current medications    Insomnia   Patient has failed these meds in past: Ambien (side effects) Patient is currently uncontrolled on the following medications:  Marland Kitchen Melatonin 3 mg 1 tablet daily . Trazodone 100 mg 1 tablet at bedtime  We discussed:  Patient does not feel benefit from Trazodone; Practicing good sleep hygiene by setting a sleep schedule and maintaining it, avoid excessive napping, following a nightly routine, avoiding screen time for 30-60 minutes before going to bed, and making the bedroom a cool, quiet and dark space   Plan Patient will limit screen time before bed. Plan to discuss with PCP about switching to another sleep medication.  Continue current  medications   Depression/Anxiety   Patient has failed these meds in past: Fluoxetine (unknown) Patient is currently controlled on the following medications:  . Venlafaxine XR 150 mg 1 capsule daily   We discussed:  Patient has serious mood swings when she misses a dose of medication; discussed switching to an alternative medicine that has a longer half life to avoid mood swings if missed dose   Plan Will send message to PCP about switching to SSRI or SNRI with a longer half life to avoid side effects with missed dose.  Continue current medications  Osteopenia    Last DEXA Scan: 01/23/2016  T-Score femoral neck: R -2.1  T-Score total hip: n/a  T-Score lumbar spine: n/a  T-Score forearm radius: -1.0  10-year probability of major osteoporotic fracture: 13.8%  10-year probability of hip fracture: 3.7%  No results found for: VD25OH   Patient is not a candidate for pharmacologic treatment  Patient has failed these meds in past: none Patient is currently uncontrolled on the following medications:  . No medications  We discussed:  Recommend [phone] units of vitamin D daily. Recommend 1200 mg of calcium daily from dietary and supplemental sources. Recommend weight-bearing and muscle strengthening exercises for building and maintaining bone density.  Plan  Start supplementation with calcium and vitamin D.  Miscellaneous   Patient is currently on the following medications:  . Turmeric 500 mg 2 tablets daily . Chlorhexidine 0.12% solution once daily  We discussed:  Bleed risk with Turmeric while taking aspirin - Monitoring for signs of bleeding such as unexplained and excessive bleeding from a cut or injury, easy or excessive bruising, blood in urine or stools, and nosebleeds without a known cause  Plan  Continue current medications   Vaccines   Reviewed and discussed patient's vaccination history.    Immunization History  Administered Date(s) Administered  . Fluad  Quad(high Dose 65+) 02/15/2020  . Influenza Split  12/26/2011  . Influenza, High Dose Seasonal PF 02/09/2015, 02/04/2017, 02/10/2018  . PFIZER SARS-COV-2 Vaccination 05/24/2019, 06/17/2019  . Pneumococcal Conjugate-13 08/03/2014  . Pneumococcal Polysaccharide-23 11/21/2012  . Tdap 02/09/2015   Patient received influenza vaccine in office.   Plan  Recommended patient receive Shingrix vaccine at pharmacy.   Medication Management   [facility] Uses pill box? No - adherence packaging Pt endorses 90% compliance - 1-2 x a month  We discussed: Current pharmacy is preferred with insurance plan and patient is satisfied with pharmacy services  Plan  Continue current medication management strategy   Follow up: 1 month phone visit  [name], PharmD Clinical Pharmacist [place] [facility] [place] [phone]

## 2020-02-15 NOTE — Telephone Encounter (Signed)
-----   Message from Viona Gilmore, [facility] sent at 02/15/2020 10:03 AM EDT ----- Regarding: CCM referral Good morning!  Can you please place a CCM referral for Ms. Vaughan Basta Suchy?  Thank you, Maddie

## 2020-02-24 ENCOUNTER — Other Ambulatory Visit: Payer: Self-pay

## 2020-02-24 ENCOUNTER — Ambulatory Visit (INDEPENDENT_AMBULATORY_CARE_PROVIDER_SITE_OTHER): Payer: Medicare Other | Admitting: Family Medicine

## 2020-02-24 ENCOUNTER — Encounter: Payer: Self-pay | Admitting: Family Medicine

## 2020-02-24 VITALS — BP 118/80 | HR 79 | Temp 97.8°F | Ht 60.0 in | Wt 157.3 lb

## 2020-02-24 DIAGNOSIS — M7062 Trochanteric bursitis, left hip: Secondary | ICD-10-CM

## 2020-02-24 DIAGNOSIS — G43909 Migraine, unspecified, not intractable, without status migrainosus: Secondary | ICD-10-CM | POA: Diagnosis not present

## 2020-02-24 DIAGNOSIS — F5104 Psychophysiologic insomnia: Secondary | ICD-10-CM

## 2020-02-24 MED ORDER — VENLAFAXINE HCL ER 37.5 MG PO CP24: ORAL_CAPSULE | ORAL | 0 refills | Status: DC

## 2020-02-24 NOTE — Patient Instructions (Addendum)
Insomnia Insomnia is a sleep disorder that makes it difficult to fall asleep or stay asleep. Insomnia can cause fatigue, low energy, difficulty concentrating, mood swings, and poor performance at work or school. There are three different ways to classify insomnia:  Difficulty falling asleep.  Difficulty staying asleep.  Waking up too early in the morning. Any type of insomnia can be long-term (chronic) or short-term (acute). Both are common. Short-term insomnia usually lasts for three months or less. Chronic insomnia occurs at least three times a week for longer than three months. What are the causes? Insomnia may be caused by another condition, situation, or substance, such as:  Anxiety.  Certain medicines.  Gastroesophageal reflux disease (GERD) or other gastrointestinal conditions.  Asthma or other breathing conditions.  Restless legs syndrome, sleep apnea, or other sleep disorders.  Chronic pain.  Menopause.  Stroke.  Abuse of alcohol, tobacco, or illegal drugs.  Mental health conditions, such as depression.  Caffeine.  Neurological disorders, such as Alzheimer's disease.  An overactive thyroid (hyperthyroidism). Sometimes, the cause of insomnia may not be known. What increases the risk? Risk factors for insomnia include:  Gender. Women are affected more often than men.  Age. Insomnia is more common as you get older.  Stress.  Lack of exercise.  Irregular work schedule or working night shifts.  Traveling between different time zones.  Certain medical and mental health conditions. What are the signs or symptoms? If you have insomnia, the main symptom is having trouble falling asleep or having trouble staying asleep. This may lead to other symptoms, such as:  Feeling fatigued or having low energy.  Feeling nervous about going to sleep.  Not feeling rested in the morning.  Having trouble concentrating.  Feeling irritable, anxious, or depressed. How  is this diagnosed? This condition may be diagnosed based on:  Your symptoms and medical history. Your health care provider may ask about: ? Your sleep habits. ? Any medical conditions you have. ? Your mental health.  A physical exam. How is this treated? Treatment for insomnia depends on the cause. Treatment may focus on treating an underlying condition that is causing insomnia. Treatment may also include:  Medicines to help you sleep.  Counseling or therapy.  Lifestyle adjustments to help you sleep better. Follow these instructions at home: Eating and drinking   Limit or avoid alcohol, caffeinated beverages, and cigarettes, especially close to bedtime. These can disrupt your sleep.  Do not eat a large meal or eat spicy foods right before bedtime. This can lead to digestive discomfort that can make it hard for you to sleep. Sleep habits   Keep a sleep diary to help you and your health care provider figure out what could be causing your insomnia. Write down: ? When you sleep. ? When you wake up during the night. ? How well you sleep. ? How rested you feel the next day. ? Any side effects of medicines you are taking. ? What you eat and drink.  Make your bedroom a dark, comfortable place where it is easy to fall asleep. ? Put up shades or blackout curtains to block light from outside. ? Use a white noise machine to block noise. ? Keep the temperature cool.  Limit screen use before bedtime. This includes: ? Watching TV. ? Using your smartphone, tablet, or computer.  Stick to a routine that includes going to bed and waking up at the same times every day and night. This can help you fall asleep faster. Consider   making a quiet activity, such as reading, part of your nighttime routine.  Try to avoid taking naps during the day so that you sleep better at night.  Get out of bed if you are still awake after 15 minutes of trying to sleep. Keep the lights down, but try reading or  doing a quiet activity. When you feel sleepy, go back to bed. General instructions  Take over-the-counter and prescription medicines only as told by your health care provider.  Exercise regularly, as told by your health care provider. Avoid exercise starting several hours before bedtime.  Use relaxation techniques to manage stress. Ask your health care provider to suggest some techniques that may work well for you. These may include: ? Breathing exercises. ? Routines to release muscle tension. ? Visualizing peaceful scenes.  Make sure that you drive carefully. Avoid driving if you feel very sleepy.  Keep all follow-up visits as told by your health care provider. This is important. Contact a health care provider if:  You are tired throughout the day.  You have trouble in your daily routine due to sleepiness.  You continue to have sleep problems, or your sleep problems get worse. Get help right away if:  You have serious thoughts about hurting yourself or someone else. If you ever feel like you may hurt yourself or others, or have thoughts about taking your own life, get help right away. You can go to your nearest emergency department or call:  Your local emergency services (911 in the [place]  A suicide crisis helpline, such as the [facility] at (858)853-1412. This is open 24 hours a day. Summary  Insomnia is a sleep disorder that makes it difficult to fall asleep or stay asleep.  Insomnia can be long-term (chronic) or short-term (acute).  Treatment for insomnia depends on the cause. Treatment may focus on treating an underlying condition that is causing insomnia.  Keep a sleep diary to help you and your health care provider figure out what could be causing your insomnia. This information is not intended to replace advice given to you by your health care provider. Make sure you discuss any questions you have with your health care provider. Document  Revised: 03/15/2017 Document Reviewed: 01/10/2017 Elsevier Patient Education  [place].   Hip Bursitis  Hip bursitis is inflammation of a fluid-filled sac (bursa) in the hip joint. The bursa prevents the bones in the hip joint from rubbing against each other. Hip bursitis can cause mild to moderate pain, and symptoms often come and go over time. What are the causes? This condition may be caused by:  Injury to the hip.  Overuse of the muscles that surround the hip joint.  Previous injury or surgery of the hip.  Arthritis or gout.  Diabetes.  Thyroid disease.  Infection. In some cases, the cause may not be known. What are the signs or symptoms? Symptoms of this condition include:  Mild or moderate pain in the hip area. Pain may get worse with movement.  Tenderness and swelling of the hip, especially on the outer side of the hip.  In rare cases, the bursa may become infected. This may cause a fever, as well as warmth and redness in the area. Symptoms may come and go. How is this diagnosed? This condition may be diagnosed based on:  A physical exam.  Your medical history.  X-rays.  Removal of fluid from your inflamed bursa for testing (biopsy). You may be sent to a  health care provider who specializes in bone diseases (orthopedist) or a provider who specializes in joint inflammation (rheumatologist). How is this treated? This condition is treated by resting, icing, applying pressure (compression), and raising (elevating) the injured area. This is called RICE treatment. In some cases, this may be enough to make your symptoms go away. Treatment may also include:  Using crutches.  Draining fluid out of the bursa to help relieve swelling.  Injecting medicine that helps to reduce inflammation (cortisone).  Additional medicines if the bursa is infected. Follow these instructions at home: Managing pain, stiffness, and swelling   If directed, put ice on the  painful area. ? Put ice in a plastic bag. ? Place a towel between your skin and the bag. ? Leave the ice on for 20 minutes, 2-3 times a day. ? Raise (elevate) your hip above the level of your heart as much as you can without pain. To do this, try putting a pillow under your hips while you lie down. Activity  Return to your normal activities as told by your health care provider. Ask your health care provider what activities are safe for you.  Rest and protect your hip as much as possible until your pain and swelling get better. General instructions  Take over-the-counter and prescription medicines only as told by your health care provider.  Wear compression wraps only as told by your health care provider.  Do not use your hip to support your body weight until your health care provider says that you can. Use crutches as told by your health care provider.  Gently massage and stretch your injured area as often as is comfortable.  Keep all follow-up visits as told by your health care provider. This is important. How is this prevented?  Exercise regularly, as told by your health care provider.  Warm up and stretch before being active.  Cool down and stretch after being active.  If an activity irritates your hip or causes pain, avoid the activity as much as possible.  Avoid sitting down for long periods at a time. Contact a health care provider if you:  Have a fever.  Develop new symptoms.  Have difficulty walking or doing everyday activities.  Have pain that gets worse or does not get better with medicine.  Develop red skin or a feeling of warmth in your hip area. Get help right away if you:  Cannot move your hip.  Have severe pain. Summary  Hip bursitis is inflammation of a fluid-filled sac (bursa) in the hip joint.  Hip bursitis can cause mild to moderate pain, and symptoms often come and go over time.  This condition is treated with rest, ice, compression,  elevation, and medicines. This information is not intended to replace advice given to you by your health care provider. Make sure you discuss any questions you have with your health care provider. Document Revised: 12/09/2017 Document Reviewed: 12/09/2017 Elsevier Patient Education  [place].

## 2020-02-24 NOTE — Progress Notes (Signed)
Established Patient Office Visit  Subjective:  Patient ID: [name], female    DOB: 1940/06/12  Age: 79 y.o. MRN: [id number]  CC:  Chief Complaint  Patient presents with  . trouble with sleep    having trouble falling and staying a sleep , venlafaxine     HPI [name] presents for discussion regarding tapering of Effexor and other issues as below  She states she was placed on Effexor several years ago for migraine prevention.  Her concern is that she has had some chronic insomnia.  She has difficulty falling asleep and staying asleep.  She is concerned Effexor is contributing to this.  She has not had any recent migraines.  She also takes atenolol.  Currently on Effexor extended release 150 mg daily.  She does sometimes drink caffeine in the form of colas around 6 PM.  No alcohol use.  Occasional daytime naps.  Tries avoid bright lights within 30 minutes of bedtime.  She has taken trazodone 150 mg without any improvement in sleep.  She also takes melatonin up to 10 mg at night.  Recent left lateral hip pain with ambulation.  She had a couple recent falls but no hip injury.  No medial or anterior hip pain.  Symptoms are relatively mild.  Past Medical History:  Diagnosis Date  . Anemia   . Anxiety   . Arthritis    knees  . Atrophic vaginitis   . Cancer [facility])    breast cancer - left  . Diverticulosis   . Endometriosis   . Family history of breast cancer   . Family history of colon cancer   . GERD (gastroesophageal reflux disease)   . Headache(784.0)   . Heart murmur    as a child only  . History of kidney stones    passed stones, no surgery required  . History of radiation therapy 01/30/16- 03/02/16   Left Breast 50 Gy in 25 fractions.   . Hyperlipidemia   . Hypertension   . Hypothyroidism   . Internal hemorrhoids   . Osteopenia   . Osteoporosis   . Personal history of chemotherapy   . Personal history of radiation therapy   . Rheumatic fever     age 26  . Sleep apnea    " very mild" does not wear CPAP  . Thyroid disease    Hypothyroid  . Ulcer   . UTI (lower urinary tract infection)     Past Surgical History:  Procedure Laterality Date  . APPENDECTOMY    . BREAST BIOPSY Left 08/19/2015  . BREAST LUMPECTOMY Left 09/08/2015  . BREAST LUMPECTOMY WITH RADIOACTIVE SEED AND SENTINEL LYMPH NODE BIOPSY Left 09/09/2015   Procedure: LEFT BREAST LUMPECTOMY WITH RADIOACTIVE SEED AND SENTINEL LYMPH NODE BIOPSY;  Surgeon: Jackolyn Confer, MD;  Location: [facility];  Service: General;  Laterality: Left;  . COLONOSCOPY    . DILATATION & CURETTAGE/HYSTEROSCOPY WITH MYOSURE N/A 10/09/2016   Procedure: DILATATION & CURETTAGE/HYSTEROSCOPY WITH MYOSURE;  Surgeon: Princess Bruins, MD;  Location: [facility];  Service: Gynecology;  Laterality: N/A;  requesting 7:30am in our block  requests one hour  . ESOPHAGOGASTRODUODENOSCOPY ENDOSCOPY    . IR GENERIC HISTORICAL  12/05/2015   IR CV LINE INJECTION 12/05/2015 [name], MD [facility]INTER[facility]  . LAPAROSCOPIC ENDOMETRIOSIS FULGURATION  1978  . PELVIC LAPAROSCOPY    . PORTACATH PLACEMENT N/A 10/04/2015   Procedure: INSERTION PORT-A-CATH WITH ULTRASOUND;  Surgeon: Jackolyn Confer, MD;  Location: [facility];  Service: General;  Laterality: N/A;  . portacath removal    . TONSILLECTOMY    . ULNAR NERVE REPAIR  2010    Family History  Problem Relation Age of Onset  . Breast cancer Mother        Age 58  . Hypertension Mother   . Heart disease Father   . Stroke Father   . Colon cancer Father        dx 26s  . Breast cancer Paternal Grandmother        Age 67  . Diabetes Paternal Grandmother   . Heart disease Maternal Grandmother     Social History   Socioeconomic History  . Marital status: Married    Spouse name: Not on file  . Number of children: 0  . Years of education: Not on file  . Highest education level: Not on file  Occupational History  . Occupation: retired  Tobacco Use  . Smoking status:  Never Smoker  . Smokeless tobacco: Never Used  Vaping Use  . Vaping Use: Never used  Substance and Sexual Activity  . Alcohol use: No  . Drug use: No  . Sexual activity: Never    Birth control/protection: Post-menopausal  Other Topics Concern  . Not on file  Social History Narrative   Retired Oncologist.    Social Determinants of Health   Financial Resource Strain:   . Difficulty of Paying Living Expenses: Not on file  Food Insecurity:   . Worried About Charity fundraiser in the Last Year: Not on file  . Ran Out of Food in the Last Year: Not on file  Transportation Needs:   . Lack of Transportation (Medical): Not on file  . Lack of Transportation (Non-Medical): Not on file  Physical Activity:   . Days of Exercise per Week: Not on file  . Minutes of Exercise per Session: Not on file  Stress:   . Feeling of Stress : Not on file  Social Connections:   . Frequency of Communication with Friends and Family: Not on file  . Frequency of Social Gatherings with Friends and Family: Not on file  . Attends Religious Services: Not on file  . Active Member of Clubs or Organizations: Not on file  . Attends Archivist Meetings: Not on file  . Marital Status: Not on file  Intimate Partner Violence:   . Fear of Current or Ex-Partner: Not on file  . Emotionally Abused: Not on file  . Physically Abused: Not on file  . Sexually Abused: Not on file    Outpatient Medications Prior to Visit  Medication Sig Dispense Refill  . atenolol (TENORMIN) 50 MG tablet TAKE 1 TABLET BY MOUTH DAILY 30 tablet 5  . chlorhexidine (PERIDEX) 0.12 % solution Use as directed 15 mLs in the mouth or throat 2 (two) times daily.     . hydrochlorothiazide (MICROZIDE) 12.5 MG capsule TAKE 1 CAPSULE BY MOUTH EVERY DAY 30 capsule 5  . levothyroxine (SYNTHROID) 50 MCG tablet TAKE 1 TABLET (50 MCG TOTAL) BY MOUTH DAILY BEFORE BREAKFAST. 30 tablet 5  . Melatonin 3 MG TBDP Take by mouth.    Earney Navy Bicarbonate (ZEGERID) 20-1100 MG CAPS capsule Take 1 capsule by mouth daily before breakfast.    . simvastatin (ZOCOR) 40 MG tablet TAKE 1 TABLET BY MOUTH ONCE AT BEDTIME 30 tablet 2  . TURMERIC PO Take 500 mg by mouth daily.     Marland Kitchen venlafaxine XR (EFFEXOR-XR) 150 MG  24 hr capsule TAKE 1 CAPSULE BY MOUTH EVERY DAY 30 capsule 2  . tamoxifen (NOLVADEX) 20 MG tablet TAKE 1 TABLET BY MOUTH EVERY DAY 30 tablet 32  . aspirin-acetaminophen-caffeine (EXCEDRIN MIGRAINE) 250-250-65 MG tablet Take 1 tablet by mouth daily as needed for headache.    . traZODone (DESYREL) 100 MG tablet Take 1 tablet (100 mg total) by mouth at bedtime. (Patient not taking: Reported on 02/24/2020) 90 tablet 3   Facility-Administered Medications Prior to Visit  Medication Dose Route Frequency Provider Last Rate Last Admin  . betamethasone acetate-betamethasone sodium phosphate (CELESTONE) injection 12 mg  12 mg Other Once Magnus Sinning, MD        Allergies  Allergen Reactions  . Nitrofurantoin Nausea And Vomiting    GI upset  . Sulfamethoxazole-Trimethoprim Nausea And Vomiting and Other (See Comments)    GI upset    ROS Review of Systems  Constitutional: Negative for chills, fever and unexpected weight change.  Respiratory: Negative for shortness of breath.   Cardiovascular: Negative for chest pain.  Psychiatric/Behavioral: Positive for sleep disturbance. Negative for agitation, confusion, dysphoric mood and hallucinations.      Objective:    Physical Exam Vitals reviewed.  Constitutional:      Appearance: Normal appearance.  Cardiovascular:     Rate and Rhythm: Normal rate and regular rhythm.  Pulmonary:     Effort: Pulmonary effort is normal.     Breath sounds: Normal breath sounds.  Musculoskeletal:     Comments: She has fairly good range of motion left hip.  She does have some tenderness over the greater trochanteric bursa.  Neurological:     Mental Status: She is alert.     BP  118/80 (BP Location: Right Arm, Patient Position: Sitting, Cuff Size: Normal)   Pulse 79   Temp 97.8 F (36.6 C) (Oral)   Ht 5' (1.524 m)   Wt 157 lb 4.8 oz (71.4 kg)   SpO2 98%   BMI 30.72 kg/m  Wt Readings from Last 3 Encounters:  02/24/20 157 lb 4.8 oz (71.4 kg)  10/07/19 158 lb 3.2 oz (71.8 kg)  09/23/19 156 lb 12.8 oz (71.1 kg)     Health Maintenance Due  Topic Date Due  . Hepatitis C Screening  Never done  . PAP SMEAR-Modifier  09/13/2018    There are no preventive care reminders to display for this patient.  Lab Results  Component Value Date   TSH 2.14 08/11/2019   Lab Results  Component Value Date   WBC 4.9 03/26/2018   HGB 11.6 (L) 03/26/2018   HCT 37.6 03/26/2018   MCV 85.5 03/26/2018   PLT 214 03/26/2018   Lab Results  Component Value Date   NA 142 08/11/2019   K 4.2 08/11/2019   CHLORIDE 103 01/03/2017   CO2 31 08/11/2019   GLUCOSE 100 (H) 08/11/2019   BUN 34 (H) 08/11/2019   CREATININE 1.05 08/11/2019   BILITOT 0.3 08/11/2019   ALKPHOS 66 08/11/2019   AST 18 08/11/2019   ALT 17 08/11/2019   PROT 6.7 08/11/2019   ALBUMIN 4.3 08/11/2019   CALCIUM 9.4 08/11/2019   ANIONGAP 11 03/26/2018   EGFR 46 (L) 01/03/2017   GFR 50.52 (L) 08/11/2019   Lab Results  Component Value Date   CHOL 143 08/11/2019   Lab Results  Component Value Date   HDL 47.60 08/11/2019   No results found for: [facility] Lab Results  Component Value Date   TRIG 218.0 (H) 08/11/2019  Lab Results  Component Value Date   CHOLHDL 3 08/11/2019   Lab Results  Component Value Date   HGBA1C 5.8 11/15/2014      Assessment & Plan:   #1 chronic insomnia.  Patient concerned that Effexor may be contributing.  She has not responded well to trazodone in the past.  We discussed sleep hygiene with handout given.  Recommend she avoid caffeine after about 1 PM Avoid regular daytime naps -We discussed tapering regimen for Effexor by tapering 37.5 mg/week until off with new  prescription given  #2 history of migraine headaches.  We did discuss the fact that she may have more frequent migraines potentially after tapering off Effexor and she will need to watch for that.  She should continue with her atenolol for now  #3 left lateral hip pain.  Suspect trochanteric bursitis.  Recommend she try some icing 20 minutes 2-3 times daily -Discussed possible corticosteroid injection at this point she declines  Meds ordered this encounter  Medications  . venlafaxine XR (EFFEXOR XR) 37.5 MG 24 hr capsule    Sig: Take three capsules daily for one week, and then two capsules daily for one week, and then one capsule daily for one week and then discontinue.    Dispense:  50 capsule    Refill:  0    Follow-up: Return in about 6 months (around 08/23/2020).    [name], MD

## 2020-03-16 ENCOUNTER — Other Ambulatory Visit: Payer: Self-pay

## 2020-03-16 ENCOUNTER — Ambulatory Visit (INDEPENDENT_AMBULATORY_CARE_PROVIDER_SITE_OTHER): Payer: Medicare Other

## 2020-03-16 VITALS — BP 120/60 | HR 74 | Temp 98.7°F | Ht 60.0 in | Wt 157.1 lb

## 2020-03-16 DIAGNOSIS — Z0000 Encounter for general adult medical examination without abnormal findings: Secondary | ICD-10-CM

## 2020-03-16 NOTE — Progress Notes (Signed)
Subjective:   [name] is a 79 y.o. female who presents for an Initial Medicare Annual Wellness Visit.  Review of Systems    N/A  Cardiac Risk Factors include: advanced age (>19men, >109 women)     Objective:    Today's Vitals   03/16/20 1535  BP: 120/60  Pulse: 74  Temp: 98.7 F (37.1 C)  TempSrc: Oral  SpO2: 98%  Weight: 157 lb 2 oz (71.3 kg)  Height: 5' (1.524 m)   Body mass index is 30.69 kg/m.  Advanced Directives 03/16/2020 10/02/2016 09/23/2016 07/21/2016 04/06/2016 03/24/2016 01/18/2016  Does Patient Have a Medical Advance Directive? No No No No No No No  Would patient like information on creating a medical advance directive? No - Patient declined Yes (MAU/Ambulatory/Procedural Areas - Information given) No - Patient declined No - Patient declined No - Patient declined - No - patient declined information    Current Medications (verified) Outpatient Encounter Medications as of 03/16/2020  Medication Sig  . atenolol (TENORMIN) 50 MG tablet TAKE 1 TABLET BY MOUTH DAILY  . chlorhexidine (PERIDEX) 0.12 % solution Use as directed 15 mLs in the mouth or throat 2 (two) times daily.   . hydrochlorothiazide (MICROZIDE) 12.5 MG capsule TAKE 1 CAPSULE BY MOUTH EVERY DAY  . levothyroxine (SYNTHROID) 50 MCG tablet TAKE 1 TABLET (50 MCG TOTAL) BY MOUTH DAILY BEFORE BREAKFAST.  . Melatonin 3 MG TBDP Take by mouth.  Earney Navy Bicarbonate (ZEGERID) 20-1100 MG CAPS capsule Take 1 capsule by mouth daily before breakfast.  . simvastatin (ZOCOR) 40 MG tablet TAKE 1 TABLET BY MOUTH ONCE AT BEDTIME  . tamoxifen (NOLVADEX) 20 MG tablet TAKE 1 TABLET BY MOUTH EVERY DAY  . TURMERIC PO Take 500 mg by mouth daily.   Marland Kitchen venlafaxine XR (EFFEXOR XR) 37.5 MG 24 hr capsule Take three capsules daily for one week, and then two capsules daily for one week, and then one capsule daily for one week and then discontinue.   Facility-Administered Encounter Medications as of 03/16/2020    Medication  . betamethasone acetate-betamethasone sodium phosphate (CELESTONE) injection 12 mg    Allergies (verified) Nitrofurantoin and Sulfamethoxazole-trimethoprim   History: Past Medical History:  Diagnosis Date  . Anemia   . Anxiety   . Arthritis    knees  . Atrophic vaginitis   . Cancer [facility])    breast cancer - left  . Diverticulosis   . Endometriosis   . Family history of breast cancer   . Family history of colon cancer   . GERD (gastroesophageal reflux disease)   . Headache(784.0)   . Heart murmur    as a child only  . History of kidney stones    passed stones, no surgery required  . History of radiation therapy 01/30/16- 03/02/16   Left Breast 50 Gy in 25 fractions.   . Hyperlipidemia   . Hypertension   . Hypothyroidism   . Internal hemorrhoids   . Osteopenia   . Osteoporosis   . Personal history of chemotherapy   . Personal history of radiation therapy   . Rheumatic fever    age 76  . Sleep apnea    " very mild" does not wear CPAP  . Thyroid disease    Hypothyroid  . Ulcer   . UTI (lower urinary tract infection)    Past Surgical History:  Procedure Laterality Date  . APPENDECTOMY    . BREAST BIOPSY Left 08/19/2015  . BREAST LUMPECTOMY Left 09/08/2015  .  BREAST LUMPECTOMY WITH RADIOACTIVE SEED AND SENTINEL LYMPH NODE BIOPSY Left 09/09/2015   Procedure: LEFT BREAST LUMPECTOMY WITH RADIOACTIVE SEED AND SENTINEL LYMPH NODE BIOPSY;  Surgeon: Jackolyn Confer, MD;  Location: [facility];  Service: General;  Laterality: Left;  . COLONOSCOPY    . DILATATION & CURETTAGE/HYSTEROSCOPY WITH MYOSURE N/A 10/09/2016   Procedure: DILATATION & CURETTAGE/HYSTEROSCOPY WITH MYOSURE;  Surgeon: Princess Bruins, MD;  Location: [facility];  Service: Gynecology;  Laterality: N/A;  requesting 7:30am in our block  requests one hour  . ESOPHAGOGASTRODUODENOSCOPY ENDOSCOPY    . IR GENERIC HISTORICAL  12/05/2015   IR CV LINE INJECTION 12/05/2015 [name], MD [facility]-INTERV RAD  .  LAPAROSCOPIC ENDOMETRIOSIS FULGURATION  1978  . PELVIC LAPAROSCOPY    . PORTACATH PLACEMENT N/A 10/04/2015   Procedure: INSERTION PORT-A-CATH WITH ULTRASOUND;  Surgeon: Jackolyn Confer, MD;  Location: [place];  Service: General;  Laterality: N/A;  . portacath removal    . TONSILLECTOMY    . ULNAR NERVE REPAIR  2010   Family History  Problem Relation Age of Onset  . Breast cancer Mother        Age 66  . Hypertension Mother   . Heart disease Father   . Stroke Father   . Colon cancer Father        dx 56s  . Breast cancer Paternal Grandmother        Age 64  . Diabetes Paternal Grandmother   . Heart disease Maternal Grandmother    Social History   Socioeconomic History  . Marital status: Married    Spouse name: Not on file  . Number of children: 0  . Years of education: Not on file  . Highest education level: Not on file  Occupational History  . Occupation: retired  Tobacco Use  . Smoking status: Never Smoker  . Smokeless tobacco: Never Used  Vaping Use  . Vaping Use: Never used  Substance and Sexual Activity  . Alcohol use: No  . Drug use: No  . Sexual activity: Never    Birth control/protection: Post-menopausal  Other Topics Concern  . Not on file  Social History Narrative   Retired Oncologist.    Social Determinants of Health   Financial Resource Strain: Low Risk   . Difficulty of Paying Living Expenses: Not hard at all  Food Insecurity: No Food Insecurity  . Worried About Charity fundraiser in the Last Year: Never true  . Ran Out of Food in the Last Year: Never true  Transportation Needs: No Transportation Needs  . Lack of Transportation (Medical): No  . Lack of Transportation (Non-Medical): No  Physical Activity: Inactive  . Days of Exercise per Week: 0 days  . Minutes of Exercise per Session: 0 min  Stress: No Stress Concern Present  . Feeling of Stress : Not at all  Social Connections: Moderately Integrated  . Frequency of Communication with  Friends and Family: More than three times a week  . Frequency of Social Gatherings with Friends and Family: Three times a week  . Attends Religious Services: More than 4 times per year  . Active Member of Clubs or Organizations: No  . Attends Archivist Meetings: Never  . Marital Status: Married    Tobacco Counseling Counseling given: Not Answered   Clinical Intake:  Pre-visit preparation completed: Yes  Pain : No/denies pain     Nutritional Risks: None Diabetes: No  How often do you need to have someone help you when  you read instructions, pamphlets, or other written materials from your doctor or pharmacy?: 1 - Never What is the last grade level you completed in school?: College  Diabetic?No   Interpreter Needed?: No  Information entered by :: [place] of Daily Living In your present state of health, do you have any difficulty performing the following activities: 03/16/2020  Hearing? Y  Comment has hearing issues plans to see audiologist  Vision? N  Difficulty concentrating or making decisions? N  Walking or climbing stairs? N  Dressing or bathing? N  Doing errands, shopping? N  Preparing Food and eating ? N  Using the Toilet? N  In the past six months, have you accidently leaked urine? Y  Comment has urinary incontience  Do you have problems with loss of bowel control? N  Managing your Medications? N  Managing your Finances? N  Housekeeping or managing your Housekeeping? N  Some recent data might be hidden    Patient Care Team: [name], MD as PCP - General [name], [name], MD as Consulting Physician (Oncology) Jackolyn Confer, MD as Consulting Physician (General Surgery) Eppie Gibson, MD as Attending Physician (Radiation Oncology) Richmond Campbell, MD as Consulting Physician (Gastroenterology) Lavonna Monarch, MD as Consulting Physician (Dermatology) Delice Bison, Charlestine Massed, NP as Nurse Practitioner (Hematology and  Oncology) Viona Gilmore, [facility] as Pharmacist (Pharmacist)  Indicate any recent Medical Services you may have received from other than [facility] providers in the past year (date may be approximate).     Assessment:   This is a routine wellness examination for [place].  Hearing/Vision screen  Hearing Screening   125Hz  250Hz  500Hz  1000Hz  2000Hz  3000Hz  4000Hz  6000Hz  8000Hz   Right ear:           Left ear:           Vision Screening Comments: Patient states gets eyes checked annually. Has a cataract to left eye   Dietary issues and exercise activities discussed: Current Exercise Habits: The patient does not participate in regular exercise at present, Exercise limited by: orthopedic condition(s)  Goals    . Exercise 3x per week (30 min per time)    . Patient Stated     I would like to get rid of bursitis in left hip    . Pharmacy Care Plan     CARE PLAN ENTRY (see longitudinal plan of care for additional care plan information)  Current Barriers:  . Chronic Disease Management support, education, and care coordination needs related to Hypertension, Hyperlipidemia, GERD, Hypothyroidism, Depression, Osteopenia, and migraines, insomnia, breast cancer   Hypertension BP Readings from Last 3 Encounters:  10/07/19 112/62  09/23/19 124/76  08/11/19 138/76   . Pharmacist Clinical Goal(s): o Over the next 90 days, patient will work with PharmD and providers to maintain BP goal <140/90 . Current regimen:  . Atenolol 50 mg 1 tablet daily . Hydrochlorothiazide 12.5 mg 1 capsule daily . Interventions: o Discussed DASH eating plan recommendations: . Emphasizes vegetables, fruits, and whole-grains . Includes fat-free or low-fat dairy products, fish, poultry, beans, nuts, and vegetable oils . Limits foods that are high in saturated fat. These foods include fatty meats, full-fat dairy products, and tropical oils such as coconut, palm kernel, and palm oils. . Limits sugar-sweetened beverages and  sweets . Limiting sodium intake to < 1500 mg/day o Discussed recommendations for moderate aerobic exercise for 150 minutes/week spread out over 5 days for heart healthy lifestyle  . Patient self care activities - Over the  next 90 days, patient will: o Check blood pressure weekly, document, and provide at future appointments o Ensure daily salt intake < 2300 mg/day  Hyperlipidemia Lab Results  Component Value Date/Time   LDLDIRECT 61.0 08/11/2019 03:18 PM   . Pharmacist Clinical Goal(s): o Over the next 90days, patient will work with PharmD and providers to maintain LDL goal < 100 . Current regimen:  o Simvastatin 40 mg 1 tablet at bedtime . Interventions: o Discussed how triglycerides can increase when: . Eating eat too much food . Eating high-fat foods such as fried foods, red meat, chicken skin, egg yolks, high-fat dairy, butter, lard, shortening, margarine, and fast food . Eating foods high in simple carbohydrates such as fresh and canned fruit, candy, ice cream and sweetened yogurt, sweetened drinks like juices, cereal, jams, foods and drinks with corn syrup, or sugar listed as the first ingredient . Drinking alcohol  . Patient self care activities - Over the next 90 days, patient will: o Continue current medications  Hypothyroidism Lab Results  Component Value Date   TSH 2.14 08/11/2019   . Pharmacist Clinical Goal(s): o Over the next 90 days, patient will work with PharmD and providers to maintain TSH between 0.4 - 4.5 . Current regimen:  o Levothyroxine 50 mcg 1 tablet daily before breakfast . Interventions: o Discussed consistent administration on an empty stomach with a full glass of water separated from other medications . Patient self care activities - Over the next 90 days, patient will: o Continue current medications  GERD . Pharmacist Clinical Goal(s) o Over the next 90 days, patient will work with PharmD and providers to minimize symptoms of heart burn /  indigestion . Current regimen:  o Zegerid 20-1100 mg 1 capsule daily before breakfast . Interventions: o Discussed long term risks of taking Zegerid and will consider taper in the future . Patient self care activities - Over the next 90 days, patient will: o Continue current medication  Migraines . Pharmacist Clinical Goal(s) o Over the next 30 days, patient will work with PharmD and providers to prevent and treat migraines . Current regimen:  o Excedrin 250-250-65 mg 1 tablet daily as needed . Interventions: o Discussed effects of taking Excedrin every day and the risk for medication overuse headaches . Patient self care activities - Over the next 30 days, patient will: o Try Tylenol (Acetaminophen) only to see if this will help with headaches o Can consider ibuprofen (Advil) every once in a while for headaches  Breast cancer . Pharmacist Clinical Goal(s) o Over the next 90 days, patient will work with PharmD and providers to continue to treat breast cancer . Current regimen:  o Tamoxifen 20 mg 1 tablet daily . Patient self care activities - Over the next 90 days, patient will: o Continue current medications  Melatonin . Pharmacist Clinical Goal(s) o Over the next 90 days, patient will work with PharmD and providers to improve sleep . Current regimen:  . Melatonin 3 mg 1 tablet at bedtime . Trazodone 100 mg 1 tablet at bedtime . Interventions: o Discussed practicing good sleep hygiene by setting a sleep schedule and maintaining it, avoid excessive napping, following a nightly routine, avoiding screen time for 30-60 minutes before going to bed, and making the bedroom a cool, quiet and dark space . Patient self care activities - Over the next 30 days, patient will: o Continue current medications  Depression/anxiety . Pharmacist Clinical Goal(s) o Over the next 30 days, patient will work with  PharmD and providers to manage symptoms of depression and anxiety . Current regimen:   o Venlafaxine XR 150 mg 1 capsule daily . Interventions: o Discussed other medications that do not have the same risk of mood swings if one dose is missed . Patient self care activities - Over the next 30 days, patient will: o Continue current medication  Osteopenia . Pharmacist Clinical Goal(s) o Over the next 90 days, patient will work with PharmD and providers to support bone health . Current regimen:  o No medications . Interventions: o Discussed recommended [phone] units of vitamin D daily and 1200 mg of calcium daily from dietary and supplemental sources. o Discussed recommended weight-bearing and muscle strengthening exercises for building and maintaining bone density . Patient self care activities - Over the next 90 days, patient will: o Supplement with [phone] units of vitamin D daily and ensure she is getting the recommended 1200 mg of calcium through diet and supplementation  Medication management . Pharmacist Clinical Goal(s): o Over the next 90 days, patient will work with PharmD and providers to maintain optimal medication adherence . Current pharmacy: [facility] . Interventions o Comprehensive medication review performed. o Continue current medication management strategy . Patient self care activities - Over the next 90 days, patient will: o Take medications as prescribed o Report any questions or concerns to PharmD and/or provider(s)  Initial goal documentation       Depression Screen PHQ 2/9 Scores 03/16/2020 02/24/2020 08/11/2019 02/10/2018 04/06/2016 09/07/2015 09/28/2014  PHQ - 2 Score 0 0 0 0 0 0 0  PHQ- 9 Score 0 - - - - - -    Fall Risk Fall Risk  03/16/2020 02/24/2020 08/11/2019 02/10/2018 04/06/2016  Falls in the past year? 1 0 1 No Yes  Number falls in past yr: 1 - 0 - 1  Injury with Fall? 0 - 0 - No  Risk for fall due to : History of fall(s) - History of fall(s) - -  Follow up Falls evaluation completed;Falls prevention discussed Falls  evaluation completed Falls evaluation completed - -    Any stairs in or around the home? Yes  If so, are there any without handrails? No  Home free of loose throw rugs in walkways, pet beds, electrical cords, etc? Yes  Adequate lighting in your home to reduce risk of falls? Yes   ASSISTIVE DEVICES UTILIZED TO PREVENT FALLS:  Life alert? No  Use of a cane, walker or w/c? No  Grab bars in the bathroom? Yes  Shower chair or bench in shower? No  Elevated toilet seat or a handicapped toilet? No   TIMED UP AND GO:  Was the test performed? Yes .  Length of time to ambulate 10 feet: 5  sec.   Gait steady and fast without use of assistive device  Cognitive Function:  Cognitive screening not indicated       Immunizations Immunization History  Administered Date(s) Administered  . Fluad Quad(high Dose 65+) 02/15/2020  . Influenza Split 12/26/2011  . Influenza, High Dose Seasonal PF 02/09/2015, 02/04/2017, 02/10/2018  . PFIZER SARS-COV-2 Vaccination 05/24/2019, 06/17/2019  . Pneumococcal Conjugate-13 08/03/2014  . Pneumococcal Polysaccharide-23 11/21/2012  . Tdap 02/09/2015    TDAP status: Up to date Flu Vaccine status: Up to date Pneumococcal vaccine status: Up to date Covid-19 vaccine status: Completed vaccines  Qualifies for Shingles Vaccine? Yes   Zostavax completed No   Shingrix Completed?: No.    Education has been provided regarding the importance  of this vaccine. Patient has been advised to call insurance company to determine out of pocket expense if they have not yet received this vaccine. Advised may also receive vaccine at local pharmacy or Health Dept. Verbalized acceptance and understanding.  Screening Tests Health Maintenance  Topic Date Due  . Hepatitis C Screening  Never done  . PAP SMEAR-Modifier  09/13/2018  . TETANUS/TDAP  02/08/2025  . INFLUENZA VACCINE  Completed  . DEXA SCAN  Completed  . COVID-19 Vaccine  Completed  . PNA vac Low Risk Adult   Completed    Health Maintenance  Health Maintenance Due  Topic Date Due  . Hepatitis C Screening  Never done  . PAP SMEAR-Modifier  09/13/2018    Colorectal cancer screening: Referral to GI placed 03/16/2020. Pt aware the office will call re: appt. Mammogram status: Completed 05/25/2019. Repeat every year Bone Density status: Completed 01/23/2016. Results reflect: Bone density results: OSTEOPENIA. Repeat every 5 years.  Lung Cancer Screening: (Low Dose CT Chest recommended if Age 86-80 years, 30 pack-year currently smoking OR have quit w/in 15years.) does not qualify.   Lung Cancer Screening Referral: N/A  Additional Screening:  Hepatitis C Screening: does qualify;   Vision Screening: Recommended annual ophthalmology exams for early detection of glaucoma and other disorders of the eye. Is the patient up to date with their annual eye exam?  Yes  Who is the provider or what is the name of the office in which the patient attends annual eye exams? [facility]  If pt is not established with a provider, would they like to be referred to a provider to establish care? No .   Dental Screening: Recommended annual dental exams for proper oral hygiene  Community Resource Referral / Chronic Care Management: CRR required this visit?  No   CCM required this visit?  No      Plan:     I have personally reviewed and noted the following in the patient's chart:   . Medical and social history . Use of alcohol, tobacco or illicit drugs  . Current medications and supplements . Functional ability and status . Nutritional status . Physical activity . Advanced directives . List of other physicians . Hospitalizations, surgeries, and ER visits in previous 12 months . Vitals . Screenings to include cognitive, depression, and falls . Referrals and appointments  In addition, I have reviewed and discussed with patient certain preventive protocols, quality metrics, and best practice  recommendations. A written personalized care plan for preventive services as well as general preventive health recommendations were provided to patient.     Ofilia Neas, LPN   24/07/6284   Nurse Notes: None

## 2020-03-16 NOTE — Patient Instructions (Signed)
[name] , Thank you for taking time to come for your Medicare Wellness Visit. I appreciate your ongoing commitment to your health goals. Please review the following plan we discussed and let me know if I can assist you in the future.   Screening recommendations/referrals: Colonoscopy: currently due orders placed this visit  Mammogram: Up to date, next due 05/24/2020 Bone Density: Up to date. Next due 01/22/2021 Recommended yearly ophthalmology/optometry visit for glaucoma screening and checkup Recommended yearly dental visit for hygiene and checkup  Vaccinations: Influenza vaccine: Up to date, next due fall 2022  Pneumococcal vaccine: Completed series  Tdap vaccine: Up to date, next due 02/08/2025 Shingles vaccine: Currently due for Shingrix if you wish to receive we recommend that you do so at your local pharmacy   Advanced directives: Advance directive discussed with you today. Even though you declined this today please call our office should you change your mind and we can give you the proper paperwork for you to fill out.   Conditions/risks identified: Taper off Exeffor as follow per Dr. Elease Hashimoto:Take three capsules daily for one week, and then two capsules daily for one week, and then one capsule daily for one week and then discontinue   Next appointment: 05/17/2020 @ 4:00 PM with Pharmacist at [facility] 65 Years and Older, Female Preventive care refers to lifestyle choices and visits with your health care provider that can promote health and wellness. What does preventive care include?  A yearly physical exam. This is also called an annual well check.  Dental exams once or twice a year.  Routine eye exams. Ask your health care provider how often you should have your eyes checked.  Personal lifestyle choices, including:  Daily care of your teeth and gums.  Regular physical activity.  Eating a healthy diet.  Avoiding tobacco and drug  use.  Limiting alcohol use.  Practicing safe sex.  Taking low-dose aspirin every day.  Taking vitamin and mineral supplements as recommended by your health care provider. What happens during an annual well check? The services and screenings done by your health care provider during your annual well check will depend on your age, overall health, lifestyle risk factors, and family history of disease. Counseling  Your health care provider may ask you questions about your:  Alcohol use.  Tobacco use.  Drug use.  Emotional well-being.  Home and relationship well-being.  Sexual activity.  Eating habits.  History of falls.  Memory and ability to understand (cognition).  Work and work Statistician.  Reproductive health. Screening  You may have the following tests or measurements:  Height, weight, and BMI.  Blood pressure.  Lipid and cholesterol levels. These may be checked every 5 years, or more frequently if you are over 13 years old.  Skin check.  Lung cancer screening. You may have this screening every year starting at age 79 if you have a 30-pack-year history of smoking and currently smoke or have quit within the past 15 years.  Fecal occult blood test (FOBT) of the stool. You may have this test every year starting at age 79.  Flexible sigmoidoscopy or colonoscopy. You may have a sigmoidoscopy every 5 years or a colonoscopy every 10 years starting at age 79.  Hepatitis C blood test.  Hepatitis B blood test.  Sexually transmitted disease (STD) testing.  Diabetes screening. This is done by checking your blood sugar (glucose) after you have not eaten for a while (fasting). You may have this  done every 1-3 years.  Bone density scan. This is done to screen for osteoporosis. You may have this done starting at age 79.  Mammogram. This may be done every 1-2 years. Talk to your health care provider about how often you should have regular mammograms. Talk with your  health care provider about your test results, treatment options, and if necessary, the need for more tests. Vaccines  Your health care provider may recommend certain vaccines, such as:  Influenza vaccine. This is recommended every year.  Tetanus, diphtheria, and acellular pertussis (Tdap, Td) vaccine. You may need a Td booster every 10 years.  Zoster vaccine. You may need this after age 79.  Pneumococcal 13-valent conjugate (PCV13) vaccine. One dose is recommended after age 79.  Pneumococcal polysaccharide (PPSV23) vaccine. One dose is recommended after age 79. Talk to your health care provider about which screenings and vaccines you need and how often you need them. This information is not intended to replace advice given to you by your health care provider. Make sure you discuss any questions you have with your health care provider. Document Released: 04/29/2015 Document Revised: 12/21/2015 Document Reviewed: 02/01/2015 Elsevier Interactive Patient Education  2017 [place] Prevention in the Home Falls can cause injuries. They can happen to people of all ages. There are many things you can do to make your home safe and to help prevent falls. What can I do on the outside of my home?  Regularly fix the edges of walkways and driveways and fix any cracks.  Remove anything that might make you trip as you walk through a door, such as a raised step or threshold.  Trim any bushes or trees on the path to your home.  Use bright outdoor lighting.  Clear any walking paths of anything that might make someone trip, such as rocks or tools.  Regularly check to see if handrails are loose or broken. Make sure that both sides of any steps have handrails.  Any raised decks and porches should have guardrails on the edges.  Have any leaves, snow, or ice cleared regularly.  Use sand or salt on walking paths during winter.  Clean up any spills in your garage right away. This includes oil  or grease spills. What can I do in the bathroom?  Use night lights.  Install grab bars by the toilet and in the tub and shower. Do not use towel bars as grab bars.  Use non-skid mats or decals in the tub or shower.  If you need to sit down in the shower, use a plastic, non-slip stool.  Keep the floor dry. Clean up any water that spills on the floor as soon as it happens.  Remove soap buildup in the tub or shower regularly.  Attach bath mats securely with double-sided non-slip rug tape.  Do not have throw rugs and other things on the floor that can make you trip. What can I do in the bedroom?  Use night lights.  Make sure that you have a light by your bed that is easy to reach.  Do not use any sheets or blankets that are too big for your bed. They should not hang down onto the floor.  Have a firm chair that has side arms. You can use this for support while you get dressed.  Do not have throw rugs and other things on the floor that can make you trip. What can I do in the kitchen?  Clean up any spills  right away.  Avoid walking on wet floors.  Keep items that you use a lot in easy-to-reach places.  If you need to reach something above you, use a strong step stool that has a grab bar.  Keep electrical cords out of the way.  Do not use floor polish or wax that makes floors slippery. If you must use wax, use non-skid floor wax.  Do not have throw rugs and other things on the floor that can make you trip. What can I do with my stairs?  Do not leave any items on the stairs.  Make sure that there are handrails on both sides of the stairs and use them. Fix handrails that are broken or loose. Make sure that handrails are as long as the stairways.  Check any carpeting to make sure that it is firmly attached to the stairs. Fix any carpet that is loose or worn.  Avoid having throw rugs at the top or bottom of the stairs. If you do have throw rugs, attach them to the floor with  carpet tape.  Make sure that you have a light switch at the top of the stairs and the bottom of the stairs. If you do not have them, ask someone to add them for you. What else can I do to help prevent falls?  Wear shoes that:  Do not have high heels.  Have rubber bottoms.  Are comfortable and fit you well.  Are closed at the toe. Do not wear sandals.  If you use a stepladder:  Make sure that it is fully opened. Do not climb a closed stepladder.  Make sure that both sides of the stepladder are locked into place.  Ask someone to hold it for you, if possible.  Clearly mark and make sure that you can see:  Any grab bars or handrails.  First and last steps.  Where the edge of each step is.  Use tools that help you move around (mobility aids) if they are needed. These include:  Canes.  Walkers.  Scooters.  Crutches.  Turn on the lights when you go into a dark area. Replace any light bulbs as soon as they burn out.  Set up your furniture so you have a clear path. Avoid moving your furniture around.  If any of your floors are uneven, fix them.  If there are any pets around you, be aware of where they are.  Review your medicines with your doctor. Some medicines can make you feel dizzy. This can increase your chance of falling. Ask your doctor what other things that you can do to help prevent falls. This information is not intended to replace advice given to you by your health care provider. Make sure you discuss any questions you have with your health care provider. Document Released: 01/27/2009 Document Revised: 09/08/2015 Document Reviewed: 05/07/2014 Elsevier Interactive Patient Education  2017 [employer].

## 2020-03-24 ENCOUNTER — Telehealth: Payer: Self-pay | Admitting: Pharmacist

## 2020-03-24 NOTE — Chronic Care Management (AMB) (Signed)
    Chronic Care Management Pharmacy Assistant   Name: [name]  MRN: [id number] DOB: 1940-12-04  Reason for Encounter: General Adherence Call   PCP : [name], MD  Allergies:   Allergies  Allergen Reactions  . Nitrofurantoin Nausea And Vomiting    GI upset  . Sulfamethoxazole-Trimethoprim Nausea And Vomiting and Other (See Comments)    GI upset    Medications: Outpatient Encounter Medications as of 03/24/2020  Medication Sig  . atenolol (TENORMIN) 50 MG tablet TAKE 1 TABLET BY MOUTH DAILY  . chlorhexidine (PERIDEX) 0.12 % solution Use as directed 15 mLs in the mouth or throat 2 (two) times daily.   . hydrochlorothiazide (MICROZIDE) 12.5 MG capsule TAKE 1 CAPSULE BY MOUTH EVERY DAY  . levothyroxine (SYNTHROID) 50 MCG tablet TAKE 1 TABLET (50 MCG TOTAL) BY MOUTH DAILY BEFORE BREAKFAST.  . Melatonin 3 MG TBDP Take by mouth.  Earney Navy Bicarbonate (ZEGERID) 20-1100 MG CAPS capsule Take 1 capsule by mouth daily before breakfast.  . simvastatin (ZOCOR) 40 MG tablet TAKE 1 TABLET BY MOUTH ONCE AT BEDTIME  . tamoxifen (NOLVADEX) 20 MG tablet TAKE 1 TABLET BY MOUTH EVERY DAY  . TURMERIC PO Take 500 mg by mouth daily.   Marland Kitchen venlafaxine XR (EFFEXOR XR) 37.5 MG 24 hr capsule Take three capsules daily for one week, and then two capsules daily for one week, and then one capsule daily for one week and then discontinue.   Facility-Administered Encounter Medications as of 03/24/2020  Medication  . betamethasone acetate-betamethasone sodium phosphate (CELESTONE) injection 12 mg    Current Diagnosis: Patient Active Problem List   Diagnosis Date Noted  . Chronic midline low back pain with bilateral sciatica 11/06/2016  . Genetic testing 09/23/2015  . Malignant neoplasm of upper-outer quadrant of left breast in female, estrogen receptor positive ([place]) 09/07/2015  . Family history of breast cancer   . Family history of colon cancer   . Prediabetes 08/30/2014  .  Endometriosis   . Osteoporosis   . Hypertension   . UTI (lower urinary tract infection) 03/05/2011  . Dysuria 03/05/2011  . Hypothyroidism 06/06/2009  . FATIGUE 06/06/2009  . ELEVATED BLOOD PRESSURE 06/06/2009  . Disorder of bone and cartilage 09/09/2008  . SCOLIOSIS 09/09/2008  . Hyperlipidemia 07/30/2008  . Migraine headache 07/30/2008  . GERD 07/30/2008  . ENTHESOPATHY OF HIP REGION 07/30/2008  . HEADACHE 07/30/2008    Goals Addressed   None     Follow-Up:  Pharmacist Review   I called the patient and discussed medication adherence with the patient, no issues with current medication.  She states that she has not started the taper of Venlafaxine XR since her last visit.  She would begin this next week. She continues to take Trazodone 150 mg and melatonin. She only has a few days left of the Trazodone.  She denies ED visits since his last CPP follow-up. the patient denies any side effects with his medication or any problems with his current pharmacy   Neita Goodnight) Mare Ferrari, [place] Assistant [phone]

## 2020-03-25 DIAGNOSIS — M79671 Pain in right foot: Secondary | ICD-10-CM | POA: Diagnosis not present

## 2020-03-25 DIAGNOSIS — M71571 Other bursitis, not elsewhere classified, right ankle and foot: Secondary | ICD-10-CM | POA: Diagnosis not present

## 2020-03-25 DIAGNOSIS — M722 Plantar fascial fibromatosis: Secondary | ICD-10-CM | POA: Diagnosis not present

## 2020-03-25 DIAGNOSIS — M25571 Pain in right ankle and joints of right foot: Secondary | ICD-10-CM | POA: Diagnosis not present

## 2020-04-11 DIAGNOSIS — M722 Plantar fascial fibromatosis: Secondary | ICD-10-CM | POA: Diagnosis not present

## 2020-04-11 DIAGNOSIS — M71571 Other bursitis, not elsewhere classified, right ankle and foot: Secondary | ICD-10-CM | POA: Diagnosis not present

## 2020-04-20 ENCOUNTER — Other Ambulatory Visit: Payer: Self-pay | Admitting: Family Medicine

## 2020-05-17 ENCOUNTER — Ambulatory Visit (INDEPENDENT_AMBULATORY_CARE_PROVIDER_SITE_OTHER): Payer: Medicare Other | Admitting: Pharmacist

## 2020-05-17 DIAGNOSIS — I10 Essential (primary) hypertension: Secondary | ICD-10-CM

## 2020-05-17 DIAGNOSIS — F5104 Psychophysiologic insomnia: Secondary | ICD-10-CM

## 2020-05-17 NOTE — Chronic Care Management (AMB) (Signed)
Chronic Care Management Pharmacy  Name: [name]  MRN: [id number] DOB: 11-29-1940  Initial Planning Appointment: completed 02/12/20  Initial Questions: 1. Have you seen any other providers since your last visit? n/a 2. Any changes in your medicines or health? Yes   Chief Complaint/ HPI  [name],  80 y.o. , female presents for their Follow-Up CCM visit with the clinical pharmacist In office.  PCP : [name], MD  Their chronic conditions include: HTN, HLD, hypothyroidism, GERD, migraines, breast cancer, insomnia, depression, osteopenia  Office Visits: -03/24/20 Ofilia Neas, LPN: Patient presented for medicare annual wellness visit.  -02/24/20 [name], MD: Patient presented for insomnia follow up. Decreased venlafaxine to taper off to see if this was affecting sleep.  -10/07/19 [name], MD: Patient presented for left foot pain and bilateral hip pain. Recommended trial of Voltaren gel 3-4 times daily. Seborrhic keratoses on right side of face treated with liquid nitrogen.  Steroid injection given into left hip.   -09/23/19 [name], MD: Patient presented for leg pain for 2 weeks. Patient reports foot pain is worse with walking. Patient also reports dry mouth and trazodone was recently increased to 100 mg but she does not feel as though it has helped with her sleep. Referral placed for orthopedic surgery.  Consult Visit: -06/01/19 Lurline Del, MD (oncology): Patient no showed for her appointment.  -10/30/202 Jola Schmidt (ophthalmology): Unable to access notes.  -11/27/18 Cassandra Heilingoetter, PA-C (oncology): Patient presented for follow up for breast cancer. She continues on Tamoxifen and is doing well. Patient is due for repeat mammogram 03/2019. Follow up in Feb 2021.  Medications: Outpatient Encounter Medications as of 05/17/2020  Medication Sig  . atenolol (TENORMIN) 50 MG tablet TAKE 1 TABLET BY MOUTH DAILY  .  chlorhexidine (PERIDEX) 0.12 % solution Use as directed 15 mLs in the mouth or throat 2 (two) times daily.   . hydrochlorothiazide (MICROZIDE) 12.5 MG capsule TAKE 1 CAPSULE BY MOUTH EVERY DAY  . levothyroxine (SYNTHROID) 50 MCG tablet TAKE 1 TABLET (50 MCG TOTAL) BY MOUTH DAILY BEFORE BREAKFAST.  . Melatonin 3 MG TBDP Take by mouth.  Earney Navy Bicarbonate (ZEGERID) 20-1100 MG CAPS capsule Take 1 capsule by mouth daily before breakfast.  . simvastatin (ZOCOR) 40 MG tablet TAKE 1 TABLET BY MOUTH ONCE AT BEDTIME  . tamoxifen (NOLVADEX) 20 MG tablet TAKE 1 TABLET BY MOUTH EVERY DAY  . TURMERIC PO Take 500 mg by mouth daily.   . [DISCONTINUED] venlafaxine XR (EFFEXOR XR) 37.5 MG 24 hr capsule Take three capsules daily for one week, and then two capsules daily for one week, and then one capsule daily for one week and then discontinue.   Facility-Administered Encounter Medications as of 05/17/2020  Medication  . betamethasone acetate-betamethasone sodium phosphate (CELESTONE) injection 12 mg   Patient completely tapered off the Effexor without any problems. Her biggest concern right now is her hip pain.  Current Diagnosis/Assessment:  Goals Addressed            This Visit's Progress   . Pharmacy Care Plan       CARE PLAN ENTRY (see longitudinal plan of care for additional care plan information)  Current Barriers:  . Chronic Disease Management support, education, and care coordination needs related to Hypertension, Hyperlipidemia, GERD, Hypothyroidism, Depression, Osteopenia, and migraines, insomnia, breast cancer   Hypertension BP Readings from Last 3 Encounters:  03/16/20 120/60  02/24/20 118/80  10/07/19 112/62   . Pharmacist Clinical Goal(s):  o Over the next 120 days, patient will work with PharmD and providers to maintain BP goal <140/90 . Current regimen:  . Atenolol 50 mg 1 tablet daily . Hydrochlorothiazide 12.5 mg 1 capsule daily . Interventions: o Discussed DASH  eating plan recommendations: . Emphasizes vegetables, fruits, and whole-grains . Includes fat-free or low-fat dairy products, fish, poultry, beans, nuts, and vegetable oils . Limits foods that are high in saturated fat. These foods include fatty meats, full-fat dairy products, and tropical oils such as coconut, palm kernel, and palm oils. . Limits sugar-sweetened beverages and sweets . Limiting sodium intake to < 1500 mg/day o Discussed recommendations for moderate aerobic exercise for 150 minutes/week spread out over 5 days for heart healthy lifestyle  . Patient self care activities - Over the next 120 days, patient will: o Check blood pressure weekly, document, and provide at future appointments o Ensure daily salt intake < 2300 mg/day  Hyperlipidemia Lab Results  Component Value Date/Time   LDLDIRECT 61.0 08/11/2019 03:18 PM   . Pharmacist Clinical Goal(s): o Over the next 120 days, patient will work with PharmD and providers to maintain LDL goal < 100 . Current regimen:  o Simvastatin 40 mg 1 tablet at bedtime . Interventions: o Discussed how triglycerides can increase when: . Eating eat too much food . Eating high-fat foods such as fried foods, red meat, chicken skin, egg yolks, high-fat dairy, butter, lard, shortening, margarine, and fast food . Eating foods high in simple carbohydrates such as fresh and canned fruit, candy, ice cream and sweetened yogurt, sweetened drinks like juices, cereal, jams, foods and drinks with corn syrup, or sugar listed as the first ingredient . Drinking alcohol  . Patient self care activities - Over the next 120 days, patient will: o Continue current medications  Hypothyroidism Lab Results  Component Value Date   TSH 2.14 08/11/2019   . Pharmacist Clinical Goal(s): o Over the next 120 days, patient will work with PharmD and providers to maintain TSH between 0.4 - 4.5 . Current regimen:  o Levothyroxine 50 mcg 1 tablet daily before  breakfast . Interventions: o Discussed consistent administration on an empty stomach with a full glass of water separated from other medications . Patient self care activities - Over the next 120 days, patient will: o Continue current medications  GERD . Pharmacist Clinical Goal(s) o Over the next 120 days, patient will work with PharmD and providers to minimize symptoms of heart burn / indigestion . Current regimen:  o Zegerid 20-1100 mg 1 capsule daily before breakfast . Interventions: o Discussed long term risks of taking Zegerid and will consider taper in the future . Patient self care activities - Over the next 120 days, patient will: o Continue current medication  Migraines . Pharmacist Clinical Goal(s) o Over the next 120 days, patient will work with PharmD and providers to prevent and treat migraines . Current regimen:  o Excedrin 250-250-65 mg 1 tablet daily as needed . Interventions: o Discussed effects of taking Excedrin every day and the risk for medication overuse headaches . Patient self care activities - Over the next 120 days, patient will: o Try Tylenol (Acetaminophen) only to see if this will help with headaches o Can consider ibuprofen (Advil) every once in a while for headaches  Breast cancer . Pharmacist Clinical Goal(s) o Over the next 120 days, patient will work with PharmD and providers to continue to treat breast cancer . Current regimen:  o Tamoxifen 20 mg 1 tablet  daily . Patient self care activities - Over the next 120 days, patient will: o Continue current medications  Insomnia . Pharmacist Clinical Goal(s) o Over the next 120 days, patient will work with PharmD and providers to improve sleep . Current regimen:  . Melatonin 3 mg 1 tablet at bedtime . Interventions: o Discussed practicing good sleep hygiene by setting a sleep schedule and maintaining it, avoid excessive napping, following a nightly routine, avoiding screen time for 30-60 minutes  before going to bed, and making the bedroom a cool, quiet and dark space o Recommended melatonin timed release and sleepy time tea . Patient self care activities - Over the next 120 days, patient will: o Continue current medications  Depression/anxiety . Pharmacist Clinical Goal(s) o Over the next 120 days, patient will work with PharmD and providers to manage symptoms of depression and anxiety . Current regimen:  o No medications . Patient self care activities - Over the next 120 days, patient will: o Continue without medications  Osteopenia . Pharmacist Clinical Goal(s) o Over the next 120 days, patient will work with PharmD and providers to support bone health . Current regimen:  o No medications . Interventions: o Discussed recommended [phone] units of vitamin D daily and 1200 mg of calcium daily from dietary and supplemental sources. o Discussed recommended weight-bearing and muscle strengthening exercises for building and maintaining bone density . Patient self care activities - Over the next 120 days, patient will: o Supplement with [phone] units of vitamin D daily and ensure she is getting the recommended 1200 mg of calcium through diet and supplementation  Medication management . Pharmacist Clinical Goal(s): o Over the next 120 days, patient will work with PharmD and providers to maintain optimal medication adherence . [facility] . Interventions o Comprehensive medication review performed. o Continue current medication management strategy . Patient self care activities - Over the next 120 days, patient will: o Take medications as prescribed o Report any questions or concerns to PharmD and/or provider(s)  Please see past updates related to this goal by clicking on the "Past Updates" button in the selected goal         Hypertension   BP goal is:  <140/90  Office blood pressures are  BP Readings from Last 3 Encounters:  03/16/20 120/60   02/24/20 118/80  10/07/19 112/62   Patient checks BP at home never Patient home BP readings are ranging: n/a  Patient has failed these meds in the past: none Patient is currently controlled on the following medications:  . Atenolol 50 mg 1 tablet daily in AM . HCTZ 12.5 mg 1 capsule daily in AM  We discussed diet and exercise extensively  -DASH eating plan recommendations: . Emphasizes vegetables, fruits, and whole-grains . Includes fat-free or low-fat dairy products, fish, poultry, beans, nuts, and vegetable oils . Limits foods that are high in saturated fat. These foods include fatty meats, full-fat dairy products, and tropical oils such as coconut, palm kernel, and palm oils. . Limits sugar-sweetened beverages and sweets . Limiting sodium intake to < 1500 mg/day -Diet: patient does not use a lot of salt when cooking but does note the saltiness of food when eating out; encouraged her to check nutrition labels when eating out for sodium content  Plan  Continue current medications     Hyperlipidemia   LDL goal < 100  Last lipids Lab Results  Component Value Date   CHOL 143 08/11/2019   HDL 47.60 08/11/2019  LDLDIRECT 61.0 08/11/2019   TRIG 218.0 (H) 08/11/2019   CHOLHDL 3 08/11/2019   Hepatic Function Latest Ref Rng & Units 08/11/2019 03/26/2018 02/10/2018  Total Protein 6.0 - 8.3 g/dL 6.7 7.0 6.7  Albumin 3.5 - 5.2 g/dL 4.3 3.9 4.3  AST 0 - 37 U/L 18 16 15   ALT 0 - 35 U/L 17 10 13   Alk Phosphatase 39 - 117 U/L 66 91 137(H)  Total Bilirubin 0.2 - 1.2 mg/dL 0.3 0.2(L) 0.4  Bilirubin, Direct 0.0 - 0.3 mg/dL 0.1 - -     The ASCVD Risk score ([place]., et al., 2013) failed to calculate for the following reasons:   The 2013 ASCVD risk score is only valid for ages 52 to 12   Patient has failed these meds in past: none Patient is currently uncontrolled (triglycerides) on the following medications:  . Simvastatin 40 mg 1 tablet at bedtime  We discussed:  diet and  exercise extensively  -Discussed how triglycerides can increase when: . Eating eat too much food (patient admits to some over eating) . Eating high-fat foods such as fried foods, red meat, chicken skin, egg yolks, high-fat dairy, butter, lard, shortening, margarine, and fast food . Eating foods high in simple carbohydrates such as fresh and canned fruit, candy, ice cream and sweetened yogurt, sweetened drinks like juices, cereal, jams, foods and drinks with corn syrup, or sugar listed as the first ingredient . Drinking alcohol   Plan  Continue current medications  Hypothyroidism   Lab Results  Component Value Date/Time   TSH 2.14 08/11/2019 03:18 PM   TSH 0.72 02/10/2018 11:45 AM    Patient has failed these meds in past: none Patient is currently controlled on the following medications:  . Levothyroxine 50 mcg 1 tablet daily before breakfast  We discussed:  consistent administration with water prior to food and other medications  Plan  Continue current medications  GERD   Patient has failed these meds in past: none Patient is currently controlled on the following medications:  . Zegerid 20-1100 mg 1 capsule daily before breakfast   We discussed:  Long term risks of PPI - patient reports she has gotten ulcers in the past from taking NSAIDs and takes to protect her stomach while taking Excedrin    Plan Will try to taper off depending on response to alternative medications for migraines. Continue current medications   Migraines   Patient has failed these meds in past: none Patient is currently controlled on the following medications:  . Excedrin 250-250-65 mg 1 tablet daily PRN   We discussed: patient is not having true migraines and if she catches it early and it will go away; she only takes 1/2 tablet of Excedrin when a headaches comes on which is about 2-3 times a week  Plan Continue current medications   Breast cancer   Patient is currently controlled on the  following medications:  . Tamoxifen 20 mg 1 tablet daily  Plan  Continue current medications    Insomnia   Patient has failed these meds in past: Ambien (side effects), trazodone Patient is currently uncontrolled on the following medications:  Marland Kitchen Melatonin 3 mg 1 tablet daily  We discussed: trying melatonin timed release to help with staying asleep and sleepy time tea - patient reports this has helped in the past  Plan Continue current medications  Will discuss with PCP about starting an alternative medication for sleep.  Depression/Anxiety   Patient has failed these meds in  past: Fluoxetine (unknown) Patient is currently controlled on the following medications:  . No medications  We discussed:  Patient reported no problems with tapering Effexor and did not notice a change in her sleep  Plan Continue current medications  Osteopenia    Last DEXA Scan: 01/23/2016  T-Score femoral neck: R -2.1  T-Score total hip: n/a  T-Score lumbar spine: n/a  T-Score forearm radius: -1.0  10-year probability of major osteoporotic fracture: 13.8%  10-year probability of hip fracture: 3.7%  No results found for: VD25OH   Patient is not a candidate for pharmacologic treatment  Patient has failed these meds in past: none Patient is currently uncontrolled on the following medications:  Marland Kitchen Vitamin D 1000 units   - not taking . Calcium 600 mg - not taking  We discussed:  Recommend [phone] units of vitamin D daily. Recommend 1200 mg of calcium daily from dietary and supplemental sources. Recommend weight-bearing and muscle strengthening exercises for building and maintaining bone density.  -Calcium: one yogurt every day, cheese sometimes  Plan Will start supplementation with calcium and vitamin D.  Miscellaneous   Patient is currently on the following medications:  . Turmeric 500 mg 2 tablets daily . Chlorhexidine 0.12% solution once daily  Plan  Continue current  medications   Vaccines   Reviewed and discussed patient's vaccination history.    Immunization History  Administered Date(s) Administered  . Fluad Quad(high Dose 65+) 02/15/2020  . Influenza Split 12/26/2011  . Influenza, High Dose Seasonal PF 02/09/2015, 02/04/2017, 02/10/2018  . PFIZER(Purple Top)SARS-COV-2 Vaccination 05/24/2019, 06/17/2019  . Pneumococcal Conjugate-13 08/03/2014  . Pneumococcal Polysaccharide-23 11/21/2012  . Tdap 02/09/2015    Plan  Recommended patient receive Shingrix vaccine at pharmacy.   Medication Management   [facility] Uses pill box? No - adherence packaging Pt endorses 90% compliance - 1-2 x a month  We discussed: Current pharmacy is preferred with insurance plan and patient is satisfied with pharmacy services  Plan  Continue current medication management strategy   Follow up: 4 month phone visit  [name], PharmD Clinical Pharmacist [facility] [place] [phone]

## 2020-05-24 NOTE — Patient Instructions (Addendum)
Hi Jalene,  It was lovely getting to speak with you again! As we discussed, please check your blood pressure regularly at home to make sure your medications are working properly. Also, make sure you are getting the recommended amounts of calcium (1200 mg per day) and vitamin D (1000 units per day) in order to strengthen your bones and reduce your risk of fractures.  Don't forget to look into getting your shingles shot at the pharmacy as well!  Please give me a call if you have any questions or need anything before our follow up.  Best, Maddie  [name], PharmD [facility] Clinical Pharmacist [place] [facility] [place]   Visit Information  Goals Addressed            This Visit's Progress   . Pharmacy Care Plan       CARE PLAN ENTRY (see longitudinal plan of care for additional care plan information)  Current Barriers:  . Chronic Disease Management support, education, and care coordination needs related to Hypertension, Hyperlipidemia, GERD, Hypothyroidism, Depression, Osteopenia, and migraines, insomnia, breast cancer   Hypertension BP Readings from Last 3 Encounters:  03/16/20 120/60  02/24/20 118/80  10/07/19 112/62   . Pharmacist Clinical Goal(s): o Over the next 120 days, patient will work with PharmD and providers to maintain BP goal <140/90 . Current regimen:  . Atenolol 50 mg 1 tablet daily . Hydrochlorothiazide 12.5 mg 1 capsule daily . Interventions: o Discussed DASH eating plan recommendations: . Emphasizes vegetables, fruits, and whole-grains . Includes fat-free or low-fat dairy products, fish, poultry, beans, nuts, and vegetable oils . Limits foods that are high in saturated fat. These foods include fatty meats, full-fat dairy products, and tropical oils such as coconut, palm kernel, and palm oils. . Limits sugar-sweetened beverages and sweets . Limiting sodium intake to < 1500 mg/day o Discussed recommendations for moderate aerobic  exercise for 150 minutes/week spread out over 5 days for heart healthy lifestyle  . Patient self care activities - Over the next 120 days, patient will: o Check blood pressure weekly, document, and provide at future appointments o Ensure daily salt intake < 2300 mg/day  Hyperlipidemia Lab Results  Component Value Date/Time   LDLDIRECT 61.0 08/11/2019 03:18 PM   . Pharmacist Clinical Goal(s): o Over the next 120 days, patient will work with PharmD and providers to maintain LDL goal < 100 . Current regimen:  o Simvastatin 40 mg 1 tablet at bedtime . Interventions: o Discussed how triglycerides can increase when: . Eating eat too much food . Eating high-fat foods such as fried foods, red meat, chicken skin, egg yolks, high-fat dairy, butter, lard, shortening, margarine, and fast food . Eating foods high in simple carbohydrates such as fresh and canned fruit, candy, ice cream and sweetened yogurt, sweetened drinks like juices, cereal, jams, foods and drinks with corn syrup, or sugar listed as the first ingredient . Drinking alcohol  . Patient self care activities - Over the next 120 days, patient will: o Continue current medications  Hypothyroidism Lab Results  Component Value Date   TSH 2.14 08/11/2019   . Pharmacist Clinical Goal(s): o Over the next 120 days, patient will work with PharmD and providers to maintain TSH between 0.4 - 4.5 . Current regimen:  o Levothyroxine 50 mcg 1 tablet daily before breakfast . Interventions: o Discussed consistent administration on an empty stomach with a full glass of water separated from other medications . Patient self care activities - Over the next 120  days, patient will: o Continue current medications  GERD . Pharmacist Clinical Goal(s) o Over the next 120 days, patient will work with PharmD and providers to minimize symptoms of heart burn / indigestion . Current regimen:  o Zegerid 20-1100 mg 1 capsule daily before  breakfast . Interventions: o Discussed long term risks of taking Zegerid and will consider taper in the future . Patient self care activities - Over the next 120 days, patient will: o Continue current medication  Migraines . Pharmacist Clinical Goal(s) o Over the next 120 days, patient will work with PharmD and providers to prevent and treat migraines . Current regimen:  o Excedrin 250-250-65 mg 1 tablet daily as needed . Interventions: o Discussed effects of taking Excedrin every day and the risk for medication overuse headaches . Patient self care activities - Over the next 120 days, patient will: o Try Tylenol (Acetaminophen) only to see if this will help with headaches o Can consider ibuprofen (Advil) every once in a while for headaches  Breast cancer . Pharmacist Clinical Goal(s) o Over the next 120 days, patient will work with PharmD and providers to continue to treat breast cancer . Current regimen:  o Tamoxifen 20 mg 1 tablet daily . Patient self care activities - Over the next 120 days, patient will: o Continue current medications  Insomnia . Pharmacist Clinical Goal(s) o Over the next 120 days, patient will work with PharmD and providers to improve sleep . Current regimen:  . Melatonin 3 mg 1 tablet at bedtime . Interventions: o Discussed practicing good sleep hygiene by setting a sleep schedule and maintaining it, avoid excessive napping, following a nightly routine, avoiding screen time for 30-60 minutes before going to bed, and making the bedroom a cool, quiet and dark space o Recommended melatonin timed release and sleepy time tea . Patient self care activities - Over the next 120 days, patient will: o Continue current medications  Depression/anxiety . Pharmacist Clinical Goal(s) o Over the next 120 days, patient will work with PharmD and providers to manage symptoms of depression and anxiety . Current regimen:  o No medications . Patient self care activities -  Over the next 120 days, patient will: o Continue without medications  Osteopenia . Pharmacist Clinical Goal(s) o Over the next 120 days, patient will work with PharmD and providers to support bone health . Current regimen:  o No medications . Interventions: o Discussed recommended [phone] units of vitamin D daily and 1200 mg of calcium daily from dietary and supplemental sources. o Discussed recommended weight-bearing and muscle strengthening exercises for building and maintaining bone density . Patient self care activities - Over the next 120 days, patient will: o Supplement with [phone] units of vitamin D daily and ensure she is getting the recommended 1200 mg of calcium through diet and supplementation  Medication management . Pharmacist Clinical Goal(s): o Over the next 120 days, patient will work with PharmD and providers to maintain optimal medication adherence . [facility] . Interventions o Comprehensive medication review performed. o Continue current medication management strategy . Patient self care activities - Over the next 120 days, patient will: o Take medications as prescribed o Report any questions or concerns to PharmD and/or provider(s)  Please see past updates related to this goal by clicking on the "Past Updates" button in the selected goal         Patient verbalizes understanding of instructions provided today and agrees to view in [place].  Telephone follow up appointment with pharmacy team member scheduled for: 4 months  Viona Gilmore, [facility]

## 2020-05-25 DIAGNOSIS — H11153 Pinguecula, bilateral: Secondary | ICD-10-CM | POA: Diagnosis not present

## 2020-05-25 DIAGNOSIS — H25813 Combined forms of age-related cataract, bilateral: Secondary | ICD-10-CM | POA: Diagnosis not present

## 2020-05-25 DIAGNOSIS — H43813 Vitreous degeneration, bilateral: Secondary | ICD-10-CM | POA: Diagnosis not present

## 2020-05-25 DIAGNOSIS — Z79899 Other long term (current) drug therapy: Secondary | ICD-10-CM | POA: Diagnosis not present

## 2020-05-25 DIAGNOSIS — H40013 Open angle with borderline findings, low risk, bilateral: Secondary | ICD-10-CM | POA: Diagnosis not present

## 2020-06-03 ENCOUNTER — Ambulatory Visit (INDEPENDENT_AMBULATORY_CARE_PROVIDER_SITE_OTHER): Payer: Medicare Other | Admitting: Family Medicine

## 2020-06-03 ENCOUNTER — Other Ambulatory Visit: Payer: Self-pay

## 2020-06-03 VITALS — BP 130/68 | HR 83 | Temp 97.9°F | Resp 18 | Ht 60.0 in | Wt 163.4 lb

## 2020-06-03 DIAGNOSIS — F5104 Psychophysiologic insomnia: Secondary | ICD-10-CM

## 2020-06-03 DIAGNOSIS — L821 Other seborrheic keratosis: Secondary | ICD-10-CM

## 2020-06-03 MED ORDER — BELSOMRA 10 MG PO TABS: 1.0000 | ORAL_TABLET | Freq: Every day | ORAL | 1 refills | Status: DC

## 2020-06-03 NOTE — Progress Notes (Signed)
Established Patient Office Visit  Subjective:  Patient ID: [name], female    DOB: 10-Mar-1941  Age: 80 y.o. MRN: [id number]  CC:  Chief Complaint  Patient presents with  . Follow-up    Insomnia     HPI [name] presents for longstanding history of chronic insomnia.  She tried multiple things in the past including Benadryl, melatonin, Ambien, trazodone without success.  No caffeine use.  No alcohol use.  Does take occasional daytime naps.  She states that she had some adverse side effects with Ambien in the past and trazodone just simply did not seem to work.  She usually gets about 2 to 3 hours sleep per night total.  She has problems with increased sleep latency and also frequent awakening.  Denies any depression symptoms.  Does not feel rested when she gets up.  When she gets up at night she usually goes the kitchen to get something to drink.  She does reading sometimes at night when she wakes up and cannot get back to sleep.  She also has a couple of skin lesions lower back that she would like to have looked at.  She cannot see these because of location.  She can feel some elevation.  Occasional pruritus.  Past Medical History:  Diagnosis Date  . Anemia   . Anxiety   . Arthritis    knees  . Atrophic vaginitis   . Cancer [facility])    breast cancer - left  . Diverticulosis   . Endometriosis   . Family history of breast cancer   . Family history of colon cancer   . GERD (gastroesophageal reflux disease)   . Headache(784.0)   . Heart murmur    as a child only  . History of kidney stones    passed stones, no surgery required  . History of radiation therapy 01/30/16- 03/02/16   Left Breast 50 Gy in 25 fractions.   . Hyperlipidemia   . Hypertension   . Hypothyroidism   . Internal hemorrhoids   . Osteopenia   . Osteoporosis   . Personal history of chemotherapy   . Personal history of radiation therapy   . Rheumatic fever    age 45  . Sleep apnea    "  very mild" does not wear CPAP  . Thyroid disease    Hypothyroid  . Ulcer   . UTI (lower urinary tract infection)     Past Surgical History:  Procedure Laterality Date  . APPENDECTOMY    . BREAST BIOPSY Left 08/19/2015  . BREAST LUMPECTOMY Left 09/08/2015  . BREAST LUMPECTOMY WITH RADIOACTIVE SEED AND SENTINEL LYMPH NODE BIOPSY Left 09/09/2015   Procedure: LEFT BREAST LUMPECTOMY WITH RADIOACTIVE SEED AND SENTINEL LYMPH NODE BIOPSY;  Surgeon: Jackolyn Confer, MD;  Location: [facility];  Service: General;  Laterality: Left;  . COLONOSCOPY    . DILATATION & CURETTAGE/HYSTEROSCOPY WITH MYOSURE N/A 10/09/2016   Procedure: DILATATION & CURETTAGE/HYSTEROSCOPY WITH MYOSURE;  Surgeon: Princess Bruins, MD;  Location: [facility];  Service: Gynecology;  Laterality: N/A;  requesting 7:30am in our block  requests one hour  . ESOPHAGOGASTRODUODENOSCOPY ENDOSCOPY    . IR GENERIC HISTORICAL  12/05/2015   IR CV LINE INJECTION 12/05/2015 [name], MD [facility]  . LAPAROSCOPIC ENDOMETRIOSIS FULGURATION  1978  . PELVIC LAPAROSCOPY    . PORTACATH PLACEMENT N/A 10/04/2015   Procedure: INSERTION PORT-A-CATH WITH ULTRASOUND;  Surgeon: Jackolyn Confer, MD;  Location: [facility];  Service: General;  Laterality:  N/A;  . portacath removal    . TONSILLECTOMY    . ULNAR NERVE REPAIR  2010    Family History  Problem Relation Age of Onset  . Breast cancer Mother        Age 37  . Hypertension Mother   . Heart disease Father   . Stroke Father   . Colon cancer Father        dx 63s  . Breast cancer Paternal Grandmother        Age 71  . Diabetes Paternal Grandmother   . Heart disease Maternal Grandmother     Social History   Socioeconomic History  . Marital status: Married    Spouse name: Not on file  . Number of children: 0  . Years of education: Not on file  . Highest education level: Not on file  Occupational History  . Occupation: retired  Tobacco Use  . Smoking status: Never Smoker  . Smokeless  tobacco: Never Used  Vaping Use  . Vaping Use: Never used  Substance and Sexual Activity  . Alcohol use: No  . Drug use: No  . Sexual activity: Never    Birth control/protection: Post-menopausal  Other Topics Concern  . Not on file  Social History Narrative   Retired Oncologist.    Social Determinants of Health   Financial Resource Strain: Low Risk   . Difficulty of Paying Living Expenses: Not hard at all  Food Insecurity: No Food Insecurity  . Worried About Charity fundraiser in the Last Year: Never true  . Ran Out of Food in the Last Year: Never true  Transportation Needs: No Transportation Needs  . Lack of Transportation (Medical): No  . Lack of Transportation (Non-Medical): No  Physical Activity: Inactive  . Days of Exercise per Week: 0 days  . Minutes of Exercise per Session: 0 min  Stress: No Stress Concern Present  . Feeling of Stress : Not at all  Social Connections: Moderately Integrated  . Frequency of Communication with Friends and Family: More than three times a week  . Frequency of Social Gatherings with Friends and Family: Three times a week  . Attends Religious Services: More than 4 times per year  . Active Member of Clubs or Organizations: No  . Attends Archivist Meetings: Never  . Marital Status: Married  Human resources officer Violence: Not At Risk  . Fear of Current or Ex-Partner: No  . Emotionally Abused: No  . Physically Abused: No  . Sexually Abused: No    Outpatient Medications Prior to Visit  Medication Sig Dispense Refill  . atenolol (TENORMIN) 50 MG tablet TAKE 1 TABLET BY MOUTH DAILY 30 tablet 5  . chlorhexidine (PERIDEX) 0.12 % solution Use as directed 15 mLs in the mouth or throat 2 (two) times daily.     . hydrochlorothiazide (MICROZIDE) 12.5 MG capsule TAKE 1 CAPSULE BY MOUTH EVERY DAY 30 capsule 5  . levothyroxine (SYNTHROID) 50 MCG tablet TAKE 1 TABLET (50 MCG TOTAL) BY MOUTH DAILY BEFORE BREAKFAST. 30 tablet 5  .  Melatonin 3 MG TBDP Take by mouth.    Earney Navy Bicarbonate (ZEGERID) 20-1100 MG CAPS capsule Take 1 capsule by mouth daily before breakfast.    . simvastatin (ZOCOR) 40 MG tablet TAKE 1 TABLET BY MOUTH ONCE AT BEDTIME 90 tablet 1  . tamoxifen (NOLVADEX) 20 MG tablet TAKE 1 TABLET BY MOUTH EVERY DAY 30 tablet 32  . TURMERIC PO Take 500 mg by mouth  daily.      Facility-Administered Medications Prior to Visit  Medication Dose Route Frequency Provider Last Rate Last Admin  . betamethasone acetate-betamethasone sodium phosphate (CELESTONE) injection 12 mg  12 mg Other Once Magnus Sinning, MD        Allergies  Allergen Reactions  . Nitrofurantoin Nausea And Vomiting    GI upset  . Sulfamethoxazole-Trimethoprim Nausea And Vomiting and Other (See Comments)    GI upset    ROS Review of Systems  Constitutional: Negative for appetite change and unexpected weight change.  Respiratory: Negative for shortness of breath.   Cardiovascular: Negative for chest pain.  Neurological: Negative for dizziness, syncope, speech difficulty, weakness and headaches.  Psychiatric/Behavioral: Positive for sleep disturbance. Negative for agitation, confusion, dysphoric mood and hallucinations.      Objective:    Physical Exam Vitals reviewed.  Constitutional:      Appearance: Normal appearance.  Cardiovascular:     Rate and Rhythm: Normal rate and regular rhythm.  Pulmonary:     Effort: Pulmonary effort is normal.     Breath sounds: Normal breath sounds.  Skin:    Comments: She has a couple well-demarcated brownish scaly lesions lower back consistent with seborrheic keratoses.  These are both about 4 mm diameter  Neurological:     General: No focal deficit present.     Mental Status: She is alert.  Psychiatric:        Mood and Affect: Mood normal.        Thought Content: Thought content normal.     BP 130/68   Pulse 83   Temp 97.9 F (36.6 C) (Oral)   Resp 18   Ht 5' (1.524 m)    Wt 163 lb 6.4 oz (74.1 kg)   SpO2 97%   BMI 31.91 kg/m  Wt Readings from Last 3 Encounters:  06/03/20 163 lb 6.4 oz (74.1 kg)  03/16/20 157 lb 2 oz (71.3 kg)  02/24/20 157 lb 4.8 oz (71.4 kg)     Health Maintenance Due  Topic Date Due  . PAP SMEAR-Modifier  09/13/2018  . COVID-19 Vaccine (3 - Pfizer risk 4-dose series) 07/15/2019    There are no preventive care reminders to display for this patient.  Lab Results  Component Value Date   TSH 2.14 08/11/2019   Lab Results  Component Value Date   WBC 4.9 03/26/2018   HGB 11.6 (L) 03/26/2018   HCT 37.6 03/26/2018   MCV 85.5 03/26/2018   PLT 214 03/26/2018   Lab Results  Component Value Date   NA 142 08/11/2019   K 4.2 08/11/2019   CHLORIDE 103 01/03/2017   CO2 31 08/11/2019   GLUCOSE 100 (H) 08/11/2019   BUN 34 (H) 08/11/2019   CREATININE 1.05 08/11/2019   BILITOT 0.3 08/11/2019   ALKPHOS 66 08/11/2019   AST 18 08/11/2019   ALT 17 08/11/2019   PROT 6.7 08/11/2019   ALBUMIN 4.3 08/11/2019   CALCIUM 9.4 08/11/2019   ANIONGAP 11 03/26/2018   EGFR 46 (L) 01/03/2017   GFR 50.52 (L) 08/11/2019   Lab Results  Component Value Date   CHOL 143 08/11/2019   Lab Results  Component Value Date   HDL 47.60 08/11/2019   No results found for: [facility] Lab Results  Component Value Date   TRIG 218.0 (H) 08/11/2019   Lab Results  Component Value Date   CHOLHDL 3 08/11/2019   Lab Results  Component Value Date   HGBA1C 5.8 11/15/2014  Assessment & Plan:   #1 chronic insomnia.  Patient is tried multiple medications as above including melatonin, Benadryl, Ambien, trazodone without relief  -We again reemphasized importance of good sleep hygiene.  Recommend she try to avoid daytime naps.  Hopefully can increase her aerobic exercise soon  -Discussed consider trial of Belsomra 10 mg nightly.  Other consideration if this is not working would be possibly low-dose Lunesta.  Would like to avoid benzodiazepines because of  increased risk of falls  #2 benign-appearing seborrheic keratosis of the back -Reassurance and observe  Meds ordered this encounter  Medications  . Suvorexant (BELSOMRA) 10 MG TABS    Sig: Take 1 tablet by mouth at bedtime.    Dispense:  30 tablet    Refill:  1    Follow-up: No follow-ups on file.    [name], MD

## 2020-06-03 NOTE — Patient Instructions (Signed)

## 2020-06-06 ENCOUNTER — Ambulatory Visit: Payer: Medicare Other | Admitting: Family Medicine

## 2020-06-30 DIAGNOSIS — M25551 Pain in right hip: Secondary | ICD-10-CM | POA: Diagnosis not present

## 2020-06-30 DIAGNOSIS — M25552 Pain in left hip: Secondary | ICD-10-CM | POA: Diagnosis not present

## 2020-07-06 DIAGNOSIS — H43392 Other vitreous opacities, left eye: Secondary | ICD-10-CM | POA: Diagnosis not present

## 2020-07-06 DIAGNOSIS — H43812 Vitreous degeneration, left eye: Secondary | ICD-10-CM | POA: Diagnosis not present

## 2020-07-06 DIAGNOSIS — H35372 Puckering of macula, left eye: Secondary | ICD-10-CM | POA: Diagnosis not present

## 2020-07-06 DIAGNOSIS — H25813 Combined forms of age-related cataract, bilateral: Secondary | ICD-10-CM | POA: Diagnosis not present

## 2020-07-06 DIAGNOSIS — H25042 Posterior subcapsular polar age-related cataract, left eye: Secondary | ICD-10-CM | POA: Diagnosis not present

## 2020-07-15 DIAGNOSIS — M25551 Pain in right hip: Secondary | ICD-10-CM | POA: Diagnosis not present

## 2020-07-15 DIAGNOSIS — M25552 Pain in left hip: Secondary | ICD-10-CM | POA: Diagnosis not present

## 2020-07-27 ENCOUNTER — Telehealth: Payer: Self-pay | Admitting: Pharmacist

## 2020-07-27 NOTE — Chronic Care Management (AMB) (Unsigned)
    Chronic Care Management Pharmacy Assistant   Name: [name]  MRN: [id number] DOB: Mar 26, 1941   Reason for Encounter: General Adherence Call     Recent office visits:  06/03/20-Burchette, Alinda Sierras, MD (OV).  Recent consult visits:  None  Hospital visits:  None in previous 6 months   Medication changes indicated:  06/03/20-Suvorexant 10 MG, 1 tablet oral daily at bedtime (added).  Medications: Outpatient Encounter Medications as of 07/27/2020  Medication Sig  . atenolol (TENORMIN) 50 MG tablet TAKE 1 TABLET BY MOUTH DAILY  . chlorhexidine (PERIDEX) 0.12 % solution Use as directed 15 mLs in the mouth or throat 2 (two) times daily.   . hydrochlorothiazide (MICROZIDE) 12.5 MG capsule TAKE 1 CAPSULE BY MOUTH EVERY DAY  . levothyroxine (SYNTHROID) 50 MCG tablet TAKE 1 TABLET (50 MCG TOTAL) BY MOUTH DAILY BEFORE BREAKFAST.  . Melatonin 3 MG TBDP Take by mouth.  Earney Navy Bicarbonate (ZEGERID) 20-1100 MG CAPS capsule Take 1 capsule by mouth daily before breakfast.  . simvastatin (ZOCOR) 40 MG tablet TAKE 1 TABLET BY MOUTH ONCE AT BEDTIME  . Suvorexant (BELSOMRA) 10 MG TABS Take 1 tablet by mouth at bedtime.  . tamoxifen (NOLVADEX) 20 MG tablet TAKE 1 TABLET BY MOUTH EVERY DAY  . TURMERIC PO Take 500 mg by mouth daily.    Facility-Administered Encounter Medications as of 07/27/2020  Medication  . betamethasone acetate-betamethasone sodium phosphate (CELESTONE) injection 12 mg    Note: Patient reports she stop taking the Suvorexant 10 MG tablet because it has not helped with her sleeping. Patient stated she was taking for 3 days but threw the rest of medications away. Patient voiced she would like to be placed on a different sleeping aid.   Patient asked to discuss her concerns with Dr. Elease Hashimoto on other sleeping medication options.   Star Rating Drugs:  SIG***

## 2020-08-01 ENCOUNTER — Encounter: Payer: Self-pay | Admitting: Family Medicine

## 2020-08-01 ENCOUNTER — Other Ambulatory Visit: Payer: Self-pay

## 2020-08-01 ENCOUNTER — Ambulatory Visit (INDEPENDENT_AMBULATORY_CARE_PROVIDER_SITE_OTHER): Payer: Medicare Other | Admitting: Family Medicine

## 2020-08-01 VITALS — BP 132/60 | HR 84 | Temp 97.8°F | Wt 165.8 lb

## 2020-08-01 DIAGNOSIS — E785 Hyperlipidemia, unspecified: Secondary | ICD-10-CM | POA: Diagnosis not present

## 2020-08-01 DIAGNOSIS — R3915 Urgency of urination: Secondary | ICD-10-CM | POA: Diagnosis not present

## 2020-08-01 DIAGNOSIS — F5104 Psychophysiologic insomnia: Secondary | ICD-10-CM

## 2020-08-01 DIAGNOSIS — I10 Essential (primary) hypertension: Secondary | ICD-10-CM

## 2020-08-01 DIAGNOSIS — E038 Other specified hypothyroidism: Secondary | ICD-10-CM

## 2020-08-01 LAB — TSH: TSH: 1.48 u[IU]/mL (ref 0.35–4.50)

## 2020-08-01 LAB — LIPID PANEL
Cholesterol: 154 mg/dL (ref 0–200)
HDL: 43.9 mg/dL (ref >39.00)
NonHDL: 110.36
Total CHOL/HDL Ratio: 4
Triglycerides: 324 mg/dL — ABNORMAL HIGH (ref 0.0–149.0)
VLDL: 64.8 mg/dL — ABNORMAL HIGH (ref 0.0–40.0)

## 2020-08-01 LAB — BASIC METABOLIC PANEL
BUN: 21 mg/dL (ref 6–23)
CO2: 27 mEq/L (ref 19–32)
Calcium: 9.4 mg/dL (ref 8.4–10.5)
Chloride: 106 mEq/L (ref 96–112)
Creatinine, Ser: 0.94 mg/dL (ref 0.40–1.20)
GFR: 57.41 mL/min — ABNORMAL LOW (ref >60.00)
Glucose, Bld: 133 mg/dL — ABNORMAL HIGH (ref 70–99)
Potassium: 3.7 mEq/L (ref 3.5–5.1)
Sodium: 143 mEq/L (ref 135–145)

## 2020-08-01 LAB — HEPATIC FUNCTION PANEL
ALT: 27 U/L (ref 0–35)
AST: 29 U/L (ref 0–37)
Albumin: 4 g/dL (ref 3.5–5.2)
Alkaline Phosphatase: 62 U/L (ref 39–117)
Bilirubin, Direct: 0.1 mg/dL (ref 0.0–0.3)
Total Bilirubin: 0.3 mg/dL (ref 0.2–1.2)
Total Protein: 6.5 g/dL (ref 6.0–8.3)

## 2020-08-01 LAB — LDL CHOLESTEROL, DIRECT: Direct LDL: 86 mg/dL

## 2020-08-01 MED ORDER — SOLIFENACIN SUCCINATE 5 MG PO TABS: 5.0000 mg | ORAL_TABLET | Freq: Every day | ORAL | 5 refills | Status: DC

## 2020-08-01 NOTE — Patient Instructions (Signed)
Urinary Frequency, Adult Urinary frequency means urinating more often than usual. You may urinate every 1-2 hours even though you drink a normal amount of fluid and do not have a bladder infection or condition. Although you urinate more often than normal,the total amount of urine produced in a day is normal. With urinary frequency, you may have an urgent need to urinate often. The stress and anxiety of needing to find a bathroom quickly can make this urge worse. This condition may go away on its own or you may need treatment at home. Home treatment may include bladder training, exercises, taking medicines, ormaking changes to your diet. Follow these instructions at home: Bladder health  Keep a bladder diary if told by your health care provider. Keep track of: What you eat and drink. How often you urinate. How much you urinate. Follow a bladder training program if told by your health care provider. This may include: Learning to delay going to the bathroom. Double urinating (voiding). This helps if you are not completely emptying your bladder. Scheduled voiding. Do Kegel exercises as told by your health care provider. Kegel exercises strengthen the muscles that help control urination, which may help the condition.  Eating and drinking If told by your health care provider, make diet changes, such as: Avoiding caffeine. Drinking fewer fluids, especially alcohol. Not drinking in the evening. Avoiding foods or drinks that may irritate the bladder. These include coffee, tea, soda, artificial sweeteners, citrus, tomato-based foods, and chocolate. Eating foods that help prevent or ease constipation. Constipation can make this condition worse. Your health care provider may recommend that you: Drink enough fluid to keep your urine pale yellow. Take over-the-counter or prescription medicines. Eat foods that are high in fiber, such as beans, whole grains, and fresh fruits and vegetables. Limit foods  that are high in fat and processed sugars, such as fried or sweet foods. General instructions Take over-the-counter and prescription medicines only as told by your health care provider. Keep all follow-up visits as told by your health care provider. This is important. Contact a health care provider if: You start urinating more often. You feel pain or irritation when you urinate. You notice blood in your urine. Your urine looks cloudy. You develop a fever. You begin vomiting. Get help right away if: You are unable to urinate. Summary Urinary frequency means urinating more often than usual. With urinary frequency, you may urinate every 1-2 hours even though you drink a normal amount of fluid and do not have a bladder infection or other bladder condition. Your health care provider may recommend that you keep a bladder diary, follow a bladder training program, or make dietary changes. If told by your health care provider, do Kegel exercises to strengthen the muscles that help control urination. Take over-the-counter and prescription medicines only as told by your health care provider. Contact a health care provider if your symptoms do not improve or get worse. This information is not intended to replace advice given to you by your health care provider. Make sure you discuss any questions you have with your healthcare provider. Document Revised: 10/10/2017 Document Reviewed: 10/10/2017 Elsevier Patient Education  2021 [employer]  

## 2020-08-01 NOTE — Progress Notes (Signed)
Established Patient Office Visit  Subjective:  Patient ID: [name], female    DOB: 10-Dec-1940  Age: 80 y.o. MRN: [id number]  CC:  Chief Complaint  Patient presents with  . Insomnia    Trouble falling asleep and staying asleep    HPI [name] presents for follow-up to discuss several items as follows  Chronic insomnia.  Some difficulty falling asleep and also staying asleep.  She tried multiple things previously including melatonin, Benadryl, Ambien, trazodone, and more recently Belsomra and none of those have worked very well.  She recently started taking over-the-counter medication called Alteril-which contains valerian root extract, L-tryptophan, and melatonin and she feels like this is helping fairly well.  We also given her handout on sleep hygiene previously and she read through that is found some helpful tips there as well.  She is having some urgency and sometimes getting up as much is 4-5 times per night.  Tries to avoid late the use of caffeine and excessive fluids at night.  She states that her GYN once previously had discussed possible medication for urgency but she decided against this.  She would like to reconsider at this time.  No polyuria during the day.  She has other chronic problems- hypothyroidism, hypertension, hyperlipidemia and medications reviewed.  Compliant with all.   Needs follow up labs.  Past Medical History:  Diagnosis Date  . Anemia   . Anxiety   . Arthritis    knees  . Atrophic vaginitis   . Cancer [facility])    breast cancer - left  . Diverticulosis   . Endometriosis   . Family history of breast cancer   . Family history of colon cancer   . GERD (gastroesophageal reflux disease)   . Headache(784.0)   . Heart murmur    as a child only  . History of kidney stones    passed stones, no surgery required  . History of radiation therapy 01/30/16- 03/02/16   Left Breast 50 Gy in 25 fractions.   . Hyperlipidemia   .  Hypertension   . Hypothyroidism   . Internal hemorrhoids   . Osteopenia   . Osteoporosis   . Personal history of chemotherapy   . Personal history of radiation therapy   . Rheumatic fever    age 74  . Sleep apnea    " very mild" does not wear CPAP  . Thyroid disease    Hypothyroid  . Ulcer   . UTI (lower urinary tract infection)     Past Surgical History:  Procedure Laterality Date  . APPENDECTOMY    . BREAST BIOPSY Left 08/19/2015  . BREAST LUMPECTOMY Left 09/08/2015  . BREAST LUMPECTOMY WITH RADIOACTIVE SEED AND SENTINEL LYMPH NODE BIOPSY Left 09/09/2015   Procedure: LEFT BREAST LUMPECTOMY WITH RADIOACTIVE SEED AND SENTINEL LYMPH NODE BIOPSY;  Surgeon: Jackolyn Confer, MD;  Location: [place];  Service: General;  Laterality: Left;  . COLONOSCOPY    . DILATATION & CURETTAGE/HYSTEROSCOPY WITH MYOSURE N/A 10/09/2016   Procedure: DILATATION & CURETTAGE/HYSTEROSCOPY WITH MYOSURE;  Surgeon: Princess Bruins, MD;  Location: [facility];  Service: Gynecology;  Laterality: N/A;  requesting 7:30am in our block  requests one hour  . ESOPHAGOGASTRODUODENOSCOPY ENDOSCOPY    . IR GENERIC HISTORICAL  12/05/2015   IR CV LINE INJECTION 12/05/2015 [name], MD [facility]  . LAPAROSCOPIC ENDOMETRIOSIS FULGURATION  1978  . PELVIC LAPAROSCOPY    . PORTACATH PLACEMENT N/A 10/04/2015   Procedure: INSERTION PORT-A-CATH WITH ULTRASOUND;  Surgeon: Jackolyn Confer, MD;  Location: [place];  Service: General;  Laterality: N/A;  . portacath removal    . TONSILLECTOMY    . ULNAR NERVE REPAIR  2010    Family History  Problem Relation Age of Onset  . Breast cancer Mother        Age 44  . Hypertension Mother   . Heart disease Father   . Stroke Father   . Colon cancer Father        dx 70s  . Breast cancer Paternal Grandmother        Age 40  . Diabetes Paternal Grandmother   . Heart disease Maternal Grandmother     Social History   Socioeconomic History  . Marital status: Married    Spouse name:  Not on file  . Number of children: 0  . Years of education: Not on file  . Highest education level: Not on file  Occupational History  . Occupation: retired  Tobacco Use  . Smoking status: Never Smoker  . Smokeless tobacco: Never Used  Vaping Use  . Vaping Use: Never used  Substance and Sexual Activity  . Alcohol use: No  . Drug use: No  . Sexual activity: Never    Birth control/protection: Post-menopausal  Other Topics Concern  . Not on file  Social History Narrative   Retired Oncologist.    Social Determinants of Health   Financial Resource Strain: Low Risk   . Difficulty of Paying Living Expenses: Not hard at all  Food Insecurity: No Food Insecurity  . Worried About Charity fundraiser in the Last Year: Never true  . Ran Out of Food in the Last Year: Never true  Transportation Needs: No Transportation Needs  . Lack of Transportation (Medical): No  . Lack of Transportation (Non-Medical): No  Physical Activity: Inactive  . Days of Exercise per Week: 0 days  . Minutes of Exercise per Session: 0 min  Stress: No Stress Concern Present  . Feeling of Stress : Not at all  Social Connections: Moderately Integrated  . Frequency of Communication with Friends and Family: More than three times a week  . Frequency of Social Gatherings with Friends and Family: Three times a week  . Attends Religious Services: More than 4 times per year  . Active Member of Clubs or Organizations: No  . Attends Archivist Meetings: Never  . Marital Status: Married  Human resources officer Violence: Not At Risk  . Fear of Current or Ex-Partner: No  . Emotionally Abused: No  . Physically Abused: No  . Sexually Abused: No    Outpatient Medications Prior to Visit  Medication Sig Dispense Refill  . atenolol (TENORMIN) 50 MG tablet TAKE 1 TABLET BY MOUTH DAILY 30 tablet 5  . chlorhexidine (PERIDEX) 0.12 % solution Use as directed 15 mLs in the mouth or throat 2 (two) times daily.     .  hydrochlorothiazide (MICROZIDE) 12.5 MG capsule TAKE 1 CAPSULE BY MOUTH EVERY DAY 30 capsule 5  . levothyroxine (SYNTHROID) 50 MCG tablet TAKE 1 TABLET (50 MCG TOTAL) BY MOUTH DAILY BEFORE BREAKFAST. 30 tablet 5  . Melatonin 3 MG TBDP Take by mouth.    Earney Navy Bicarbonate (ZEGERID) 20-1100 MG CAPS capsule Take 1 capsule by mouth daily before breakfast.    . simvastatin (ZOCOR) 40 MG tablet TAKE 1 TABLET BY MOUTH ONCE AT BEDTIME 90 tablet 1  . tamoxifen (NOLVADEX) 20 MG tablet TAKE 1 TABLET BY MOUTH EVERY  DAY 30 tablet 32  . TURMERIC PO Take 500 mg by mouth daily.     . Suvorexant (BELSOMRA) 10 MG TABS Take 1 tablet by mouth at bedtime. 30 tablet 1   Facility-Administered Medications Prior to Visit  Medication Dose Route Frequency Provider Last Rate Last Admin  . betamethasone acetate-betamethasone sodium phosphate (CELESTONE) injection 12 mg  12 mg Other Once Magnus Sinning, MD        Allergies  Allergen Reactions  . Nitrofurantoin Nausea And Vomiting    GI upset  . Sulfamethoxazole-Trimethoprim Nausea And Vomiting and Other (See Comments)    GI upset    ROS Review of Systems  Constitutional: Negative for chills, fever and unexpected weight change.  Respiratory: Negative for cough and shortness of breath.   Cardiovascular: Negative for chest pain.  Gastrointestinal: Negative for abdominal pain.  Genitourinary: Positive for urgency. Negative for flank pain.  Psychiatric/Behavioral: Positive for sleep disturbance. Negative for dysphoric mood.      Objective:    Physical Exam Vitals reviewed.  Constitutional:      Appearance: Normal appearance.  Cardiovascular:     Rate and Rhythm: Normal rate and regular rhythm.  Pulmonary:     Effort: Pulmonary effort is normal.     Breath sounds: Normal breath sounds.  Musculoskeletal:     Right lower leg: No edema.     Left lower leg: No edema.  Neurological:     Mental Status: She is alert.     BP 132/60 (BP  Location: Left Arm, Patient Position: Sitting, Cuff Size: Normal)   Pulse 84   Temp 97.8 F (36.6 C) (Oral)   Wt 165 lb 12.8 oz (75.2 kg)   SpO2 96%   BMI 32.38 kg/m  Wt Readings from Last 3 Encounters:  08/01/20 165 lb 12.8 oz (75.2 kg)  06/03/20 163 lb 6.4 oz (74.1 kg)  03/16/20 157 lb 2 oz (71.3 kg)     Health Maintenance Due  Topic Date Due  . PAP SMEAR-Modifier  09/13/2018  . COVID-19 Vaccine (3 - Pfizer risk 4-dose series) 07/15/2019    There are no preventive care reminders to display for this patient.  Lab Results  Component Value Date   TSH 2.14 08/11/2019   Lab Results  Component Value Date   WBC 4.9 03/26/2018   HGB 11.6 (L) 03/26/2018   HCT 37.6 03/26/2018   MCV 85.5 03/26/2018   PLT 214 03/26/2018   Lab Results  Component Value Date   NA 142 08/11/2019   K 4.2 08/11/2019   CHLORIDE 103 01/03/2017   CO2 31 08/11/2019   GLUCOSE 100 (H) 08/11/2019   BUN 34 (H) 08/11/2019   CREATININE 1.05 08/11/2019   BILITOT 0.3 08/11/2019   ALKPHOS 66 08/11/2019   AST 18 08/11/2019   ALT 17 08/11/2019   PROT 6.7 08/11/2019   ALBUMIN 4.3 08/11/2019   CALCIUM 9.4 08/11/2019   ANIONGAP 11 03/26/2018   EGFR 46 (L) 01/03/2017   GFR 50.52 (L) 08/11/2019   Lab Results  Component Value Date   CHOL 143 08/11/2019   Lab Results  Component Value Date   HDL 47.60 08/11/2019   No results found for: [facility] Lab Results  Component Value Date   TRIG 218.0 (H) 08/11/2019   Lab Results  Component Value Date   CHOLHDL 3 08/11/2019   Lab Results  Component Value Date   HGBA1C 5.8 11/15/2014      Assessment & Plan:   #1 chronic insomnia.  This has gone on for really years.  She also has some urine urgency as below which may be exacerbating issues.  She has tried multiple medications as above without success.  We strongly advise avoiding things like benzodiazepines which can increase risk of falls.  Would prefer to avoid things like Lunesta as she has felt "off  balance "with Ambien in the past.  She was given previous handout on sleep hygiene.  Avoid anti-cholinergic medications such as Benadryl.  She will continue currently on over-the-counter medication as above which seems to be helping  #2 urinary urgency. -Discussed avoiding caffeine after about 12 noon -Discussed avoidance of excessive fluids in the evening hours -Discussed possible medications to help.  She is having frequency up to 5 times at night.  Myrbetriq can interact with tamoxifen and so we would have to avoid that medication.  We did discuss possible trial of low-dose Vesicare 5 mg p.o. nightly.  Reviewed potential side effects such as constipation and dry mouth.  We will set up 1 month follow-up to reassess  #3 hypothyroidism -check TSH  #4 Hypertension- stable -check BMP with HCTZ therapy  #5 hyperlipidemia on Simvastatin -check lipid and hepatic.  Meds ordered this encounter  Medications  . solifenacin (VESICARE) 5 MG tablet    Sig: Take 1 tablet (5 mg total) by mouth daily.    Dispense:  30 tablet    Refill:  5    Follow-up: Return in about 1 month (around 08/31/2020).    [name], MD

## 2020-08-23 DIAGNOSIS — M25552 Pain in left hip: Secondary | ICD-10-CM | POA: Diagnosis not present

## 2020-09-13 ENCOUNTER — Telehealth: Payer: Self-pay | Admitting: Pharmacist

## 2020-09-13 NOTE — Chronic Care Management (AMB) (Signed)
Date- Patient called to remind of appointment with Watt Climes on 09/14/2020 @ 11:00 am  Patient aware of appointment date, time, and type of appointment (either telephone or in person). Patient aware to have/bring all medications, supplements, blood pressure and/or blood sugar logs to visit.  Questions: Have you had any recent office visit or specialist visit outside of [place]? No Are there any concerns you would like to discuss during your office visit? No Are you having any problems obtaining your medications? (Whether it pharmacy issues or cost) No   Star Rating Drug:  Dispensed Quantity Pharmacy  Simvastatin 40 mg 03.04.2022 [facility]    Any gaps in medications fill history? No   Maia Breslow, [place] Pharmacist Assistant [phone]

## 2020-09-14 ENCOUNTER — Ambulatory Visit (INDEPENDENT_AMBULATORY_CARE_PROVIDER_SITE_OTHER): Payer: Medicare Other | Admitting: Pharmacist

## 2020-09-14 DIAGNOSIS — I10 Essential (primary) hypertension: Secondary | ICD-10-CM | POA: Diagnosis not present

## 2020-09-14 DIAGNOSIS — Z780 Asymptomatic menopausal state: Secondary | ICD-10-CM

## 2020-09-14 DIAGNOSIS — F5104 Psychophysiologic insomnia: Secondary | ICD-10-CM

## 2020-09-14 NOTE — Progress Notes (Signed)
Chronic Care Management Pharmacy Note  09/19/2020 Name:  [name] MRN:  [id number] DOB:  12-Jul-1940  Summary: -BP controlled at goal < 140/90 -LDL controlled at goal < 100 -Sleep still not controlled per patient  Recommendations/Changes made from today's visit: -Recommended regular exercise to improve sleep -Recommended repeat DEXA and sleep study   Plan: -Follow up in 6 months  Subjective: [name] is an 80 y.o. year old female who is a primary patient of Burchette, Alinda Sierras, MD.  The CCM team was consulted for assistance with disease management and care coordination needs.    Engaged with patient by telephone for follow up visit in response to provider referral for pharmacy case management and/or care coordination services.   Consent to Services:  The patient was given information about Chronic Care Management services, agreed to services, and gave verbal consent prior to initiation of services.  Please see initial visit note for detailed documentation.   Patient Care Team: Stephanie Post, MD as PCP - General Kirby, Stephanie Dad, MD as Consulting Physician (Oncology) Jackolyn Confer, MD as Consulting Physician (General Surgery) Eppie Gibson, MD as Attending Physician (Radiation Oncology) Richmond Campbell, MD as Consulting Physician (Gastroenterology) Lavonna Monarch, MD as Consulting Physician (Dermatology) Delice Bison Charlestine Massed, NP as Nurse Practitioner (Hematology and Oncology) [name], [facility] as Pharmacist (Pharmacist)  Recent office visits: 08/01/20 Stephanie Littler, MD: Patient presented for insomnia follow up. Belsomra did not help. Patient is using OTC medication which is helping some. Prescribed Vesicare for OAB.  06/03/20 Stephanie Littler, MD: Patient presented for insomnia follow up. Prescribed Belsomra 10 mg nightly.  03/24/20 Stephanie Neas, LPN: Patient presented for medicare annual wellness visit.  02/24/20 Stephanie Littler,  MD: Patient presented for insomnia follow up. Decreased venlafaxine to taper off to see if this was affecting sleep.  Recent consult visits: 04/11/20 Inocencio Homes (podiatry): Unable to access notes.   Hospital visits: None in previous 6 months   Objective:  Lab Results  Component Value Date   CREATININE 0.92 (H) 09/16/2020   BUN 22 09/16/2020   GFR 57.41 (L) 08/01/2020   GFRNONAA >60 03/26/2018   GFRAA >60 03/26/2018   NA 143 09/16/2020   K 4.0 09/16/2020   CALCIUM 9.2 09/16/2020   CO2 25 09/16/2020   GLUCOSE 92 09/16/2020    Lab Results  Component Value Date/Time   HGBA1C 5.8 11/15/2014 09:24 AM   GFR 57.41 (L) 08/01/2020 01:48 PM   GFR 50.52 (L) 08/11/2019 03:18 PM    Last diabetic Eye exam: No results found for: HMDIABEYEEXA  Last diabetic Foot exam: No results found for: HMDIABFOOTEX   Lab Results  Component Value Date   CHOL 154 08/01/2020   HDL 43.90 08/01/2020   LDLDIRECT 86.0 08/01/2020   TRIG 324.0 (H) 08/01/2020   CHOLHDL 4 08/01/2020    Hepatic Function Latest Ref Rng & Units 09/16/2020 08/01/2020 08/11/2019  Total Protein 6.1 - 8.1 g/dL 6.6 6.5 6.7  Albumin 3.5 - 5.2 g/dL - 4.0 4.3  AST 10 - 35 U/L _0 ALT 6 - 29 U/L _1 Alk Phosphatase 39 - 117 U/L - 62 66  Total Bilirubin 0.2 - 1.2 mg/dL 0.4 0.3 0.3  Bilirubin, Direct 0.0 - 0.3 mg/dL - 0.1 0.1    Lab Results  Component Value Date/Time   TSH 1.48 08/01/2020 01:48 PM   TSH 2.14 08/11/2019 03:18 PM    CBC Latest Ref Rng & Units 03/26/2018 02/10/2018  01/03/2017  WBC 4.0 - 10.5 K/uL 4.9 5.7 6.1  Hemoglobin 12.0 - 15.0 g/dL 11.6(L) 11.4(L) 15.0  Hematocrit 36.0 - 46.0 % 37.6 34.6(L) 45.0  Platelets 150 - 400 K/uL 214 375.0 246    No results found for: VD25OH  Clinical ASCVD: No  The ASCVD Risk score Mikey Bussing DC Jr., et al., 2013) failed to calculate for the following reasons:   The 2013 ASCVD risk score is only valid for ages 42 to 86    Depression screen PHQ 2/9 03/16/2020 02/24/2020  08/11/2019  Decreased Interest 0 0 0  Down, Depressed, Hopeless 0 0 0  PHQ - 2 Score 0 0 0  Altered sleeping 0 - -  Tired, decreased energy 0 - -  Change in appetite 0 - -  Feeling bad or failure about yourself  0 - -  Trouble concentrating 0 - -  Moving slowly or fidgety/restless 0 - -  Suicidal thoughts 0 - -  PHQ-9 Score 0 - -  Difficult doing work/chores Not difficult at all - -  Some recent data might be hidden      Social History   Tobacco Use  Smoking Status Never Smoker  Smokeless Tobacco Never Used   BP Readings from Last 3 Encounters:  09/16/20 140/70  08/01/20 132/60  06/03/20 130/68   Pulse Readings from Last 3 Encounters:  09/16/20 70  08/01/20 84  06/03/20 83   Wt Readings from Last 3 Encounters:  09/16/20 168 lb 1.6 oz (76.2 kg)  08/01/20 165 lb 12.8 oz (75.2 kg)  06/03/20 163 lb 6.4 oz (74.1 kg)   BMI Readings from Last 3 Encounters:  09/16/20 32.83 kg/m  08/01/20 32.38 kg/m  06/03/20 31.91 kg/m    Assessment/Interventions: Review of patient past medical history, allergies, medications, health status, including review of consultants reports, laboratory and other test data, was performed as part of comprehensive evaluation and provision of chronic care management services.   SDOH:  (Social Determinants of Health) assessments and interventions performed: No  SDOH Screenings   Alcohol Screen: Low Risk   . Last Alcohol Screening Score (AUDIT): 1  Depression (PHQ2-9): Low Risk   . PHQ-2 Score: 0  Financial Resource Strain: Low Risk   . Difficulty of Paying Living Expenses: Not hard at all  Food Insecurity: No Food Insecurity  . Worried About Charity fundraiser in the Last Year: Never true  . Ran Out of Food in the Last Year: Never true  Housing: Low Risk   . Last Housing Risk Score: 0  Physical Activity: Inactive  . Days of Exercise per Week: 0 days  . Minutes of Exercise per Session: 0 min  Social Connections: Moderately Integrated  .  Frequency of Communication with Friends and Family: More than three times a week  . Frequency of Social Gatherings with Friends and Family: Three times a week  . Attends Religious Services: More than 4 times per year  . Active Member of Clubs or Organizations: No  . Attends Archivist Meetings: Never  . Marital Status: Married  Stress: No Stress Concern Present  . Feeling of Stress : Not at all  Tobacco Use: Low Risk   . Smoking Tobacco Use: Never Smoker  . Smokeless Tobacco Use: Never Used  Transportation Needs: No Transportation Needs  . Lack of Transportation (Medical): No  . Lack of Transportation (Non-Medical): No    CCM Care Plan  Allergies  Allergen Reactions  . Nitrofurantoin Nausea And Vomiting  GI upset  . Sulfamethoxazole-Trimethoprim Nausea And Vomiting and Other (See Comments)    GI upset    Medications Reviewed Today    Reviewed by [name], CMA (Certified Medical Assistant) on 09/16/20 at 1423  Med List Status: <None>  Medication Order Taking? Sig Documenting Provider Last Dose Status Informant  atenolol (TENORMIN) 50 MG tablet [id number] Yes TAKE 1 TABLET BY MOUTH DAILY Burchette, Alinda Sierras, MD Taking Active   betamethasone acetate-betamethasone sodium phosphate (CELESTONE) injection 12 mg [id number]   Magnus Sinning, MD  Active   chlorhexidine (PERIDEX) 0.12 % solution [id number] Yes Use as directed 15 mLs in the mouth or throat 2 (two) times daily.  [provider] Taking Active Self           Med Note Tinnie Gens   Fri Sep 30, 2015  8:58 AM)       hydrochlorothiazide (MICROZIDE) 12.5 MG capsule [id number] Yes TAKE 1 CAPSULE BY MOUTH EVERY DAY Burchette, Alinda Sierras, MD Taking Active   levothyroxine (SYNTHROID) 50 MCG tablet [id number] Yes TAKE 1 TABLET (50 MCG TOTAL) BY MOUTH DAILY BEFORE BREAKFAST. Stephanie Post, MD Taking Active   Melatonin 3 MG TBDP [id number] Yes Take by mouth. [provider] Taking Active    Omeprazole-Sodium Bicarbonate (ZEGERID) 20-1100 MG CAPS capsule [id number] Yes Take 1 capsule by mouth daily before breakfast. [provider] Taking Active Self  simvastatin (ZOCOR) 40 MG tablet [id number] Yes TAKE 1 TABLET BY MOUTH ONCE AT BEDTIME Burchette, Alinda Sierras, MD Taking Active   solifenacin (VESICARE) 5 MG tablet [id number] Yes Take 1 tablet (5 mg total) by mouth daily. Stephanie Post, MD Taking Active   tamoxifen (NOLVADEX) 20 MG tablet [id number] Yes TAKE 1 TABLET BY MOUTH EVERY DAY Kirby, Stephanie Dad, MD Taking Active   TURMERIC PO [id number] Yes Take 500 mg by mouth daily.  [provider] Taking Active Self          Patient Active Problem List   Diagnosis Date Noted  . Chronic midline low back pain with bilateral sciatica 11/06/2016  . Genetic testing 09/23/2015  . Malignant neoplasm of upper-outer quadrant of left breast in female, estrogen receptor positive ([place]) 09/07/2015  . Family history of breast cancer   . Family history of colon cancer   . Prediabetes 08/30/2014  . Endometriosis   . Osteoporosis   . Hypertension   . UTI (lower urinary tract infection) 03/05/2011  . Dysuria 03/05/2011  . Hypothyroidism 06/06/2009  . FATIGUE 06/06/2009  . ELEVATED BLOOD PRESSURE 06/06/2009  . Disorder of bone and cartilage 09/09/2008  . SCOLIOSIS 09/09/2008  . Hyperlipidemia 07/30/2008  . Migraine headache 07/30/2008  . GERD 07/30/2008  . ENTHESOPATHY OF HIP REGION 07/30/2008  . HEADACHE 07/30/2008    Immunization History  Administered Date(s) Administered  . Fluad Quad(high Dose 65+) 02/15/2020  . Influenza Split 12/26/2011  . Influenza, High Dose Seasonal PF 02/09/2015, 02/04/2017, 02/10/2018  . PFIZER(Purple Top)SARS-COV-2 Vaccination 05/24/2019, 06/17/2019, 01/14/2020  . Pneumococcal Conjugate-13 08/03/2014  . Pneumococcal Polysaccharide-23 11/21/2012  . Tdap 02/09/2015  . Zoster Recombinat (Shingrix) 07/07/2020    Conditions to be  addressed/monitored:  Hypertension, Hyperlipidemia, GERD, Hypothyroidism, Depression, Osteopenia and Insomnia, Breast cancer, Migraines  Care Plan : [name]  Updates made by [name], [place] since 09/19/2020 12:00 AM    Problem: Problem: Hypertension, Hyperlipidemia, GERD, Hypothyroidism, Depression, Osteopenia and Insomnia, Breast cancer, Migraines     Long-Range Goal: Patient-Specific Goal   Start Date:  09/14/2020  Expected End Date: 09/14/2021  This Visit's Progress: On track  Priority: High  Note:   Current Barriers:  . Unable to independently monitor therapeutic efficacy . Unable to achieve control of insomnia   Pharmacist Clinical Goal(s):  Marland Kitchen Patient will achieve adherence to monitoring guidelines and medication adherence to achieve therapeutic efficacy . achieve control of insomnia as evidenced by improved quality of sleep  through collaboration with PharmD and provider.   Interventions: . 1:1 collaboration with Stephanie Post, MD regarding development and update of comprehensive plan of care as evidenced by provider attestation and co-signature . Inter-disciplinary care team collaboration (see longitudinal plan of care) . Comprehensive medication review performed; medication list updated in electronic medical record  Hypertension (BP goal <140/90) -Controlled (in office) -Current treatment:  Atenolol 50 mg 1 tablet daily in AM  HCTZ 12.5 mg 1 capsule daily in AM -Medications previously tried: none  -Current home readings: does not check at home and does not own a monitor -Current dietary habits: patient does not use a lot of salt when cooking but does note the saltiness of food when eating out; encouraged her to check nutrition labels when eating out for sodium content -Current exercise habits: not consistently -Denies hypotensive/hypertensive symptoms -Educated on BP goals and benefits of medications for prevention of heart attack, stroke and kidney  damage; Exercise goal of 150 minutes per week; Importance of home blood pressure monitoring; Proper BP monitoring technique; -Counseled to monitor BP at home weekly, document, and provide log at future appointments -Counseled on diet and exercise extensively Recommended to continue current medication Recommended purchasing a BP cuff and monitoring at home  Hyperlipidemia: (LDL goal < 100) -Controlled -Current treatment: . Simvastatin 40 mg 1 tablet at bedtime -Medications previously tried: none  -Current dietary patterns: drinking more fruit juices in the past year -Current exercise habits: not consistently -Educated on Cholesterol goals;  Benefits of statin for ASCVD risk reduction; Importance of limiting foods high in cholesterol; -Counseled on diet and exercise extensively Recommended to continue current medication Counseled on foods that can elevate triglycerides such as fruit juices, alcohol and sweets  Hypothyroidism (Goal: TSH 0.35-4.5) -Controlled -Current treatment  . Levothyroxine 50 mcg 1 tablet daily before breakfast -Medications previously tried: none  -Counseled on consistent administration with water prior to food and other medications  GERD (Goal: minimize symptoms) -Controlled -Current treatment  . Zegerid 20-1100 mg 1 capsule daily before breakfast  -Medications previously tried: none  -Counseled on long term risks of PPI - patient reports she has gotten ulcers in the past from taking NSAIDs and takes to protect her stomach while taking Excedrin  Migraines (Goal: control symptoms of migraines) -Controlled -Current treatment  . Excedrin 250-250-65 mg 1 tablet daily PRN  -Medications previously tried: none  -Patient is not having true migraines and if she catches it early and it will go away; she only takes 1/2 tablet of Excedrin when a headaches comes on which is about 2-3 times a week  Breast cancer (Goal: remission) -Controlled -Current treatment   . Tamoxifen 20 mg 1 tablet daily -Medications previously tried: none  -Recommended to continue current medication   Depression/Anxiety (Goal: minimize symptoms) -Controlled -Current treatment: . No medications -Medications previously tried/failed: Fluoxetine (unknown), Effexor -PHQ9: 0 -GAD7: n/a -Educated on  not constantly needing a medication -Recommended to continue as is  Insomnia (Goal: improve quality and quantity of sleep) -Uncontrolled -Current treatment   Alteril (Melatonin 3 mg 1 tablet daily (with valerian  root) -Medications previously tried: Ambien (side effects), trazodone, Belsomra -Counseled on benefits of regular exercise to improve sleep Recommended repeat sleep study.  Osteopenia (Goal prevent fractures) -Controlled -Last DEXA Scan: 01/23/2016             T-Score femoral neck: R -2.1             T-Score total hip: n/a             T-Score lumbar spine: n/a             T-Score forearm radius: -1.0             10-year probability of major osteoporotic fracture: 13.8%             10-year probability of hip fracture: 3.7% -Patient is not a candidate for pharmacologic treatment -Current treatment   Vitamin D 2000 units daily  Calcium & magnesium, 250 mg mag -  600 mg Ca & vitamin D  -Medications previously tried: none  -Recommend [phone] units of vitamin D daily. Recommend 1200 mg of calcium daily from dietary and supplemental sources. Recommend weight-bearing and muscle strengthening exercises for building and maintaining bone density. -Counseled on diet and exercise extensively Recommended repeat DEXA scan Patient just restarted taking calcium and vitamin D daily this morning.   Health Maintenance -Vaccine gaps: 2nd dose of shingrix, COVID booster -Current therapy:   Turmeric 500 mg 2 tablets daily  Chlorhexidine 0.12% solution once daily -Educated on Cost vs benefit of each product must be carefully weighed by individual consumer -Patient is  satisfied with current therapy and denies issues -Recommended to continue current medication  Patient Goals/Self-Care Activities . Patient will:  - take medications as prescribed check blood pressure weekly, document, and provide at future appointments target a minimum of 150 minutes of moderate intensity exercise weekly  Follow Up Plan: Telephone follow up appointment with care management team member scheduled for: 6 months        Medication Assistance: None required.  Patient affirms current coverage meets needs.  Compliance/Adherence/Medication fill history: Care Gaps: Second dose of Shingrix, COVID booster  [facility]: Simvastatin 40 mg - [facility]07/20 [facility]  Patient's preferred pharmacy is:  [facility] [place], [place] - [place] [address] [place] [address] Phone: [phone] Fax: [phone]  [id number] [place], [place] - [address] [facility] [address] [place] [address] Phone: [phone] Fax: [phone]  Uses pill box? No - adherence packaging with [facility] endorses 99% compliance  We discussed: Current pharmacy is preferred with insurance plan and patient is satisfied with pharmacy services Patient decided to: Continue current medication management strategy  Care Plan and Follow Up Patient Decision:  Patient agrees to Care Plan and Follow-up.  Plan: Telephone follow up appointment with care management team member scheduled for:  6 months  [name], PharmD [facility] Pharmacist [place] [facility] [place] [phone]

## 2020-09-15 DIAGNOSIS — M25552 Pain in left hip: Secondary | ICD-10-CM | POA: Diagnosis not present

## 2020-09-15 DIAGNOSIS — M48062 Spinal stenosis, lumbar region with neurogenic claudication: Secondary | ICD-10-CM | POA: Diagnosis not present

## 2020-09-15 DIAGNOSIS — M5459 Other low back pain: Secondary | ICD-10-CM | POA: Diagnosis not present

## 2020-09-15 DIAGNOSIS — R600 Localized edema: Secondary | ICD-10-CM | POA: Diagnosis not present

## 2020-09-16 ENCOUNTER — Ambulatory Visit (INDEPENDENT_AMBULATORY_CARE_PROVIDER_SITE_OTHER): Payer: Medicare Other | Admitting: Internal Medicine

## 2020-09-16 ENCOUNTER — Other Ambulatory Visit: Payer: Self-pay

## 2020-09-16 ENCOUNTER — Encounter: Payer: Self-pay | Admitting: Internal Medicine

## 2020-09-16 VITALS — BP 140/70 | HR 70 | Temp 97.8°F | Wt 168.1 lb

## 2020-09-16 DIAGNOSIS — I10 Essential (primary) hypertension: Secondary | ICD-10-CM | POA: Diagnosis not present

## 2020-09-16 DIAGNOSIS — R600 Localized edema: Secondary | ICD-10-CM

## 2020-09-16 MED ORDER — HYDROCHLOROTHIAZIDE 25 MG PO TABS: 25.0000 mg | ORAL_TABLET | Freq: Every day | ORAL | 1 refills | Status: DC

## 2020-09-16 NOTE — Patient Instructions (Signed)
-  Nice seeing you today!!  -Lab work today; will notify you once results are available.  -We will get you scheduled for an echocardiogram.  -Please get fitted and wear compression stockings.

## 2020-09-16 NOTE — Progress Notes (Signed)
Acute office Visit     This visit occurred during the SARS-CoV-2 public health emergency.  Safety protocols were in place, including screening questions prior to the visit, additional usage of staff PPE, and extensive cleaning of exam room while observing appropriate contact time as indicated for disinfecting solutions.    CC/Reason for Visit: Discuss swollen legs  HPI: [name] is a 80 y.o. female who is coming in today for the above mentioned reasons. Past Medical History is significant for: Hypertension and prior breast cancer.  She went to see her orthopedist yesterday for hip pain and was told that she had swollen legs, this prompted her to schedule this visit today.  She had not noticed it prior to yesterday, however she had noticed that her legs have felt tight.  She denies shortness of breath.   Past Medical/Surgical History: Past Medical History:  Diagnosis Date  . Anemia   . Anxiety   . Arthritis    knees  . Atrophic vaginitis   . Cancer [facility])    breast cancer - left  . Diverticulosis   . Endometriosis   . Family history of breast cancer   . Family history of colon cancer   . GERD (gastroesophageal reflux disease)   . Headache(784.0)   . Heart murmur    as a child only  . History of kidney stones    passed stones, no surgery required  . History of radiation therapy 01/30/16- 03/02/16   Left Breast 50 Gy in 25 fractions.   . Hyperlipidemia   . Hypertension   . Hypothyroidism   . Internal hemorrhoids   . Osteopenia   . Osteoporosis   . Personal history of chemotherapy   . Personal history of radiation therapy   . Rheumatic fever    age 83  . Sleep apnea    " very mild" does not wear CPAP  . Thyroid disease    Hypothyroid  . Ulcer   . UTI (lower urinary tract infection)     Past Surgical History:  Procedure Laterality Date  . APPENDECTOMY    . BREAST BIOPSY Left 08/19/2015  . BREAST LUMPECTOMY Left 09/08/2015  . BREAST LUMPECTOMY  WITH RADIOACTIVE SEED AND SENTINEL LYMPH NODE BIOPSY Left 09/09/2015   Procedure: LEFT BREAST LUMPECTOMY WITH RADIOACTIVE SEED AND SENTINEL LYMPH NODE BIOPSY;  Surgeon: Jackolyn Confer, MD;  Location: [place];  Service: General;  Laterality: Left;  . COLONOSCOPY    . DILATATION & CURETTAGE/HYSTEROSCOPY WITH MYOSURE N/A 10/09/2016   Procedure: DILATATION & CURETTAGE/HYSTEROSCOPY WITH MYOSURE;  Surgeon: Princess Bruins, MD;  Location: [facility];  Service: Gynecology;  Laterality: N/A;  requesting 7:30am in our block  requests one hour  . ESOPHAGOGASTRODUODENOSCOPY ENDOSCOPY    . IR GENERIC HISTORICAL  12/05/2015   IR CV LINE INJECTION 12/05/2015 [name], MD [facility]  . LAPAROSCOPIC ENDOMETRIOSIS FULGURATION  1978  . PELVIC LAPAROSCOPY    . PORTACATH PLACEMENT N/A 10/04/2015   Procedure: INSERTION PORT-A-CATH WITH ULTRASOUND;  Surgeon: Jackolyn Confer, MD;  Location: [place];  Service: General;  Laterality: N/A;  . portacath removal    . TONSILLECTOMY    . ULNAR NERVE REPAIR  2010    Social History:  reports that she has never smoked. She has never used smokeless tobacco. She reports that she does not drink alcohol and does not use drugs.  Allergies: Allergies  Allergen Reactions  . Nitrofurantoin Nausea And Vomiting    GI upset  .  Sulfamethoxazole-Trimethoprim Nausea And Vomiting and Other (See Comments)    GI upset    Family History:  Family History  Problem Relation Age of Onset  . Breast cancer Mother        Age 64  . Hypertension Mother   . Heart disease Father   . Stroke Father   . Colon cancer Father        dx 91s  . Breast cancer Paternal Grandmother        Age 73  . Diabetes Paternal Grandmother   . Heart disease Maternal Grandmother      Current Outpatient Medications:  .  atenolol (TENORMIN) 50 MG tablet, TAKE 1 TABLET BY MOUTH DAILY, Disp: 30 tablet, Rfl: 5 .  chlorhexidine (PERIDEX) 0.12 % solution, Use as directed 15 mLs in the mouth or throat 2 (two)  times daily. , Disp: , Rfl:  .  hydrochlorothiazide (HYDRODIURIL) 25 MG tablet, Take 1 tablet (25 mg total) by mouth daily., Disp: 90 tablet, Rfl: 1 .  levothyroxine (SYNTHROID) 50 MCG tablet, TAKE 1 TABLET (50 MCG TOTAL) BY MOUTH DAILY BEFORE BREAKFAST., Disp: 30 tablet, Rfl: 5 .  Melatonin 3 MG TBDP, Take by mouth., Disp: , Rfl:  .  Omeprazole-Sodium Bicarbonate (ZEGERID) 20-1100 MG CAPS capsule, Take 1 capsule by mouth daily before breakfast., Disp: , Rfl:  .  simvastatin (ZOCOR) 40 MG tablet, TAKE 1 TABLET BY MOUTH ONCE AT BEDTIME, Disp: 90 tablet, Rfl: 1 .  solifenacin (VESICARE) 5 MG tablet, Take 1 tablet (5 mg total) by mouth daily., Disp: 30 tablet, Rfl: 5 .  tamoxifen (NOLVADEX) 20 MG tablet, TAKE 1 TABLET BY MOUTH EVERY DAY, Disp: 30 tablet, Rfl: 32 .  TURMERIC PO, Take 500 mg by mouth daily. , Disp: , Rfl:   Current Facility-Administered Medications:  .  betamethasone acetate-betamethasone sodium phosphate (CELESTONE) injection 12 mg, 12 mg, Other, Once, Magnus Sinning, MD  Review of Systems:  Constitutional: Denies fever, chills, diaphoresis, appetite change and fatigue.  HEENT: Denies photophobia, eye pain, redness, hearing loss, ear pain, congestion, sore throat, rhinorrhea, sneezing, mouth sores, trouble swallowing, neck pain, neck stiffness and tinnitus.   Respiratory: Denies SOB, DOE, cough, chest tightness,  and wheezing.   Cardiovascular: Denies chest pain, palpitations. Gastrointestinal: Denies nausea, vomiting, abdominal pain, diarrhea, constipation, blood in stool and abdominal distention.  Genitourinary: Denies dysuria, urgency, frequency, hematuria, flank pain and difficulty urinating.  Endocrine: Denies: hot or cold intolerance, sweats, changes in hair or nails, polyuria, polydipsia. Musculoskeletal: Denies myalgias, back pain, joint swelling, arthralgias and gait problem.  Skin: Denies pallor, rash and wound.  Neurological: Denies dizziness, seizures, syncope,  weakness, light-headedness, numbness and headaches.  Hematological: Denies adenopathy. Easy bruising, personal or family bleeding history  Psychiatric/Behavioral: Denies suicidal ideation, mood changes, confusion, nervousness, sleep disturbance and agitation    Physical Exam: Vitals:   09/16/20 1423  BP: 140/70  Pulse: 70  Temp: 97.8 F (36.6 C)  TempSrc: Oral  SpO2: 97%  Weight: 168 lb 1.6 oz (76.2 kg)    Body mass index is 32.83 kg/m.   Constitutional: NAD, calm, comfortable Eyes: PERRL, lids and conjunctivae normal, wears corrective lenses ENMT: Mucous membranes are moist.  Respiratory: clear to auscultation bilaterally, no wheezing, no crackles. Normal respiratory effort. No accessory muscle use.  Cardiovascular: Regular rate and rhythm, no murmurs / rubs / gallops.  3+ pitting bilateral lower extremity edema. 2+ pedal pulses. No carotid bruits.  Abdomen: no tenderness, no masses palpated. No hepatosplenomegaly. Bowel sounds positive.  Neurologic: Grossly intact and nonfocal Psychiatric: Normal judgment and insight. Alert and oriented x 3. Normal mood.    Impression and Plan:  Bilateral leg edema  Primary hypertension  -Given bilateral, less likely to be DVT. -I believe most likely chronic diastolic heart failure or chronic venous insufficiency as etiologies. -She will be sent for an echocardiogram, check CMP today to follow kidney and liver function. -She will get fitted for compression stockings at a medical supply store. -I will increase her hydrochlorothiazide from 12.5-25. -Further work-up to follow.  Time spent: 31 minutes reviewing chart, interviewing and examining patient, formulating plan of care.   Patient Instructions  -Nice seeing you today!!  -Lab work today; will notify you once results are available.  -We will get you scheduled for an echocardiogram.  -Please get fitted and wear compression stockings.     [name],  MD [facility] at [facility]

## 2020-09-17 LAB — COMPREHENSIVE METABOLIC PANEL
AG Ratio: 1.8 (calc) (ref 1.0–2.5)
ALT: 22 U/L (ref 6–29)
AST: 29 U/L (ref 10–35)
Albumin: 4.2 g/dL (ref 3.6–5.1)
Alkaline phosphatase (APISO): 63 U/L (ref 37–153)
BUN/Creatinine Ratio: 24 (calc) — ABNORMAL HIGH (ref 6–22)
BUN: 22 mg/dL (ref 7–25)
CO2: 25 mmol/L (ref 20–32)
Calcium: 9.2 mg/dL (ref 8.6–10.4)
Chloride: 108 mmol/L (ref 98–110)
Creat: 0.92 mg/dL — ABNORMAL HIGH (ref 0.60–0.88)
Globulin: 2.4 g/dL (calc) (ref 1.9–3.7)
Glucose, Bld: 92 mg/dL (ref 65–99)
Potassium: 4 mmol/L (ref 3.5–5.3)
Sodium: 143 mmol/L (ref 135–146)
Total Bilirubin: 0.4 mg/dL (ref 0.2–1.2)
Total Protein: 6.6 g/dL (ref 6.1–8.1)

## 2020-09-19 DIAGNOSIS — H25812 Combined forms of age-related cataract, left eye: Secondary | ICD-10-CM | POA: Diagnosis not present

## 2020-09-19 DIAGNOSIS — H43813 Vitreous degeneration, bilateral: Secondary | ICD-10-CM | POA: Diagnosis not present

## 2020-09-19 DIAGNOSIS — H35372 Puckering of macula, left eye: Secondary | ICD-10-CM | POA: Diagnosis not present

## 2020-09-19 DIAGNOSIS — H25813 Combined forms of age-related cataract, bilateral: Secondary | ICD-10-CM | POA: Diagnosis not present

## 2020-09-19 DIAGNOSIS — H40013 Open angle with borderline findings, low risk, bilateral: Secondary | ICD-10-CM | POA: Diagnosis not present

## 2020-09-19 NOTE — Patient Instructions (Addendum)
Hi [name],  It was great speaking with you again! Below is a summary of some of the topics we discussed.   Please reach out to me if you have any questions or need anything before our follow up!  Best, Maddie  [name], PharmD, [facility]  Visit Information  Goals Addressed   None    Patient Care Plan: CCM Pharmacy Care Plan    Problem Identified: Problem: Hypertension, Hyperlipidemia, GERD, Hypothyroidism, Depression, Osteopenia and Insomnia, Breast cancer, Migraines     Long-Range Goal: Patient-Specific Goal   Start Date: 09/14/2020  Expected End Date: 09/14/2021  This Visit's Progress: On track  Priority: High  Note:   Current Barriers:  . Unable to independently monitor therapeutic efficacy . Unable to achieve control of insomnia   Pharmacist Clinical Goal(s):  Marland Kitchen Patient will achieve adherence to monitoring guidelines and medication adherence to achieve therapeutic efficacy . achieve control of insomnia as evidenced by improved quality of sleep  through collaboration with PharmD and provider.   Interventions: . 1:1 collaboration with [name], MD regarding development and update of comprehensive plan of care as evidenced by provider attestation and co-signature . Inter-disciplinary care team collaboration (see longitudinal plan of care) . Comprehensive medication review performed; medication list updated in electronic medical record  Hypertension (BP goal <140/90) -Controlled (in office) -Current treatment:  Atenolol 50 mg 1 tablet daily in AM  HCTZ 12.5 mg 1 capsule daily in AM -Medications previously tried: none  -Current home readings: does not check at home and does not own a monitor -Current dietary habits: patient does not use a lot of salt when cooking but does note the saltiness of food when eating out; encouraged her to check nutrition labels when eating out for sodium  content -Current exercise habits: not consistently -Denies hypotensive/hypertensive symptoms -Educated on BP goals and benefits of medications for prevention of heart attack, stroke and kidney damage; Exercise goal of 150 minutes per week; Importance of home blood pressure monitoring; Proper BP monitoring technique; -Counseled to monitor BP at home weekly, document, and provide log at future appointments -Counseled on diet and exercise extensively Recommended to continue current medication Recommended purchasing a BP cuff and monitoring at home  Hyperlipidemia: (LDL goal < 100) -Controlled -Current treatment: . Simvastatin 40 mg 1 tablet at bedtime -Medications previously tried: none  -Current dietary patterns: drinking more fruit juices in the past year -Current exercise habits: not consistently -Educated on Cholesterol goals;  Benefits of statin for ASCVD risk reduction; Importance of limiting foods high in cholesterol; -Counseled on diet and exercise extensively Recommended to continue current medication Counseled on foods that can elevate triglycerides such as fruit juices, alcohol and sweets  Hypothyroidism (Goal: TSH 0.35-4.5) -Controlled -Current treatment  . Levothyroxine 50 mcg 1 tablet daily before breakfast -Medications previously tried: none  -Counseled on consistent administration with water prior to food and other medications  GERD (Goal: minimize symptoms) -Controlled -Current treatment  . Zegerid 20-1100 mg 1 capsule daily before breakfast  -Medications previously tried: none  -Counseled on long term risks of PPI - patient reports she has gotten ulcers in the past from taking NSAIDs and takes to protect her stomach while taking Excedrin  Migraines (Goal: control symptoms of migraines) -Controlled -Current treatment  . Excedrin 250-250-65 mg 1 tablet daily PRN  -Medications previously tried: none  -Patient is not having true migraines and if she catches it  early and it will go away;  she only takes 1/2 tablet of Excedrin when a headaches comes on which is about 2-3 times a week  Breast cancer (Goal: remission) -Controlled -Current treatment  . Tamoxifen 20 mg 1 tablet daily -Medications previously tried: none  -Recommended to continue current medication   Depression/Anxiety (Goal: minimize symptoms) -Controlled -Current treatment: . No medications -Medications previously tried/failed: Fluoxetine (unknown), Effexor -PHQ9: 0 -GAD7: n/a -Educated on  not constantly needing a medication -Recommended to continue as is  Insomnia (Goal: improve quality and quantity of sleep) -Uncontrolled -Current treatment   Alteril (Melatonin 3 mg 1 tablet daily (with valerian root) -Medications previously tried: Ambien (side effects), trazodone, Belsomra -Counseled on benefits of regular exercise to improve sleep Recommended repeat sleep study.  Osteopenia (Goal prevent fractures) -Controlled -Last DEXA Scan: 01/23/2016             T-Score femoral neck: R -2.1             T-Score total hip: n/a             T-Score lumbar spine: n/a             T-Score forearm radius: -1.0             10-year probability of major osteoporotic fracture: 13.8%             10-year probability of hip fracture: 3.7% -Patient is not a candidate for pharmacologic treatment -Current treatment   Vitamin D 2000 units daily  Calcium & magnesium, 250 mg mag -  600 mg Ca & vitamin D  -Medications previously tried: none  -Recommend [phone] units of vitamin D daily. Recommend 1200 mg of calcium daily from dietary and supplemental sources. Recommend weight-bearing and muscle strengthening exercises for building and maintaining bone density. -Counseled on diet and exercise extensively Recommended repeat DEXA scan Patient just restarted taking calcium and vitamin D daily this morning.   Health Maintenance -Vaccine gaps: 2nd dose of shingrix, COVID booster -Current therapy:    Turmeric 500 mg 2 tablets daily  Chlorhexidine 0.12% solution once daily -Educated on Cost vs benefit of each product must be carefully weighed by individual consumer -Patient is satisfied with current therapy and denies issues -Recommended to continue current medication  Patient Goals/Self-Care Activities . Patient will:  - take medications as prescribed check blood pressure weekly, document, and provide at future appointments target a minimum of 150 minutes of moderate intensity exercise weekly  Follow Up Plan: Telephone follow up appointment with care management team member scheduled for: 6 months       Patient verbalizes understanding of instructions provided today and agrees to view in [place].  Telephone follow up appointment with pharmacy team member scheduled for: 6 months  Viona Gilmore, [facility]

## 2020-09-20 NOTE — Addendum Note (Signed)
Addended by: [name] on: 09/20/2020 08:35 PM   Modules accepted: Orders

## 2020-09-29 ENCOUNTER — Other Ambulatory Visit: Payer: Self-pay

## 2020-09-29 DIAGNOSIS — I509 Heart failure, unspecified: Secondary | ICD-10-CM

## 2020-10-24 ENCOUNTER — Ambulatory Visit (HOSPITAL_COMMUNITY): Payer: Medicare Other | Attending: Internal Medicine

## 2020-10-24 ENCOUNTER — Other Ambulatory Visit: Payer: Self-pay

## 2020-10-24 DIAGNOSIS — R600 Localized edema: Secondary | ICD-10-CM | POA: Diagnosis not present

## 2020-10-24 LAB — ECHOCARDIOGRAM COMPLETE
Area-P 1/2: 4.33 cm2
P 1/2 time: 264 msec
S' Lateral: 2.7 cm

## 2020-10-25 ENCOUNTER — Encounter: Payer: Self-pay | Admitting: Internal Medicine

## 2020-10-25 DIAGNOSIS — I5030 Unspecified diastolic (congestive) heart failure: Secondary | ICD-10-CM | POA: Insufficient documentation

## 2020-11-01 DIAGNOSIS — H268 Other specified cataract: Secondary | ICD-10-CM | POA: Diagnosis not present

## 2020-11-01 DIAGNOSIS — H25812 Combined forms of age-related cataract, left eye: Secondary | ICD-10-CM | POA: Diagnosis not present

## 2020-11-02 ENCOUNTER — Other Ambulatory Visit: Payer: Self-pay

## 2020-11-03 ENCOUNTER — Ambulatory Visit (INDEPENDENT_AMBULATORY_CARE_PROVIDER_SITE_OTHER): Payer: Medicare Other | Admitting: Internal Medicine

## 2020-11-03 ENCOUNTER — Encounter: Payer: Self-pay | Admitting: Internal Medicine

## 2020-11-03 VITALS — BP 150/70 | HR 86 | Temp 97.9°F | Wt 167.9 lb

## 2020-11-03 DIAGNOSIS — I5033 Acute on chronic diastolic (congestive) heart failure: Secondary | ICD-10-CM

## 2020-11-03 DIAGNOSIS — I10 Essential (primary) hypertension: Secondary | ICD-10-CM | POA: Diagnosis not present

## 2020-11-03 MED ORDER — LOSARTAN POTASSIUM 50 MG PO TABS: 50.0000 mg | ORAL_TABLET | Freq: Every day | ORAL | 1 refills | Status: DC

## 2020-11-03 MED ORDER — METOPROLOL TARTRATE 25 MG PO TABS: 25.0000 mg | ORAL_TABLET | Freq: Two times a day (BID) | ORAL | 1 refills | Status: DC

## 2020-11-03 MED ORDER — FUROSEMIDE 20 MG PO TABS: 20.0000 mg | ORAL_TABLET | Freq: Every day | ORAL | 1 refills | Status: DC

## 2020-11-03 NOTE — Patient Instructions (Signed)
-  Nice seeing you today!!  -STOP taking atenolol and HCTZ.  -Start losartan 50 mg daily.  -Start metoprolol 25 mg twice daily.  -Start lasix 20 mg daily.  -Schedule follow up with Dr. Elease Hashimoto in 6 weeks.

## 2020-11-03 NOTE — Progress Notes (Signed)
Established Patient Office Visit     This visit occurred during the SARS-CoV-2 public health emergency.  Safety protocols were in place, including screening questions prior to the visit, additional usage of staff PPE, and extensive cleaning of exam room while observing appropriate contact time as indicated for disinfecting solutions.    CC/Reason for Visit: Follow-up echocardiogram results  HPI: [name] is a 80 y.o. female who is coming in today for the above mentioned reasons.  Her primary provider is Dr. Elease Hashimoto.  I saw her in early June due to concerns of lower extremity edema.  She has a history of longstanding, not well controlled hypertension.  I had concerns for congestive heart failure so I sent her for an echocardiogram.  This has resulted with an ejection fraction of 60 to 65% and most significantly grade 2 diastolic dysfunction.  She is brought in today to follow-up on these results and to consider medication changes.  Since I last saw her she has had cataract surgery.  She continues to have 3+ pitting edema.  She has some shortness of breath but states this is chronic.  Past Medical/Surgical History: Past Medical History:  Diagnosis Date   Anemia    Anxiety    Arthritis    knees   Atrophic vaginitis    Cancer ([place])    breast cancer - left   Diverticulosis    Endometriosis    Family history of breast cancer    Family history of colon cancer    GERD (gastroesophageal reflux disease)    Headache(784.0)    Heart murmur    as a child only   History of kidney stones    passed stones, no surgery required   History of radiation therapy 01/30/16- 03/02/16   Left Breast 50 Gy in 25 fractions.    Hyperlipidemia    Hypertension    Hypothyroidism    Internal hemorrhoids    Osteopenia    Osteoporosis    Personal history of chemotherapy    Personal history of radiation therapy    Rheumatic fever    age 30   Sleep apnea    " very mild" does not wear  CPAP   Thyroid disease    Hypothyroid   Ulcer    UTI (lower urinary tract infection)     Past Surgical History:  Procedure Laterality Date   APPENDECTOMY     BREAST BIOPSY Left 08/19/2015   BREAST LUMPECTOMY Left 09/08/2015   BREAST LUMPECTOMY WITH RADIOACTIVE SEED AND SENTINEL LYMPH NODE BIOPSY Left 09/09/2015   Procedure: LEFT BREAST LUMPECTOMY WITH RADIOACTIVE SEED AND SENTINEL LYMPH NODE BIOPSY;  Surgeon: Jackolyn Confer, MD;  Location: [place];  Service: General;  Laterality: Left;   COLONOSCOPY     DILATATION & CURETTAGE/HYSTEROSCOPY WITH MYOSURE N/A 10/09/2016   Procedure: [place];  Surgeon: Princess Bruins, MD;  Location: [facility];  Service: Gynecology;  Laterality: N/A;  requesting 7:30am in our block  requests one hour   ESOPHAGOGASTRODUODENOSCOPY ENDOSCOPY     IR GENERIC HISTORICAL  12/05/2015   IR CV LINE INJECTION 12/05/2015 [name], MD [facility]   LAPAROSCOPIC ENDOMETRIOSIS FULGURATION  1978   PELVIC LAPAROSCOPY     PORTACATH PLACEMENT N/A 10/04/2015   Procedure: INSERTION PORT-A-CATH WITH ULTRASOUND;  Surgeon: Jackolyn Confer, MD;  Location: [place];  Service: General;  Laterality: N/A;   portacath removal     TONSILLECTOMY     ULNAR NERVE REPAIR  2010  Social History:  reports that she has never smoked. She has never used smokeless tobacco. She reports that she does not drink alcohol and does not use drugs.  Allergies: Allergies  Allergen Reactions   Nitrofurantoin Nausea And Vomiting    GI upset   Sulfamethoxazole-Trimethoprim Nausea And Vomiting and Other (See Comments)    GI upset    Family History:  Family History  Problem Relation Age of Onset   Breast cancer Mother        Age 28   Hypertension Mother    Heart disease Father    Stroke Father    Colon cancer Father        dx 70s   Breast cancer Paternal Grandmother        Age 79   Diabetes Paternal Grandmother    Heart disease Maternal Grandmother       Current Outpatient Medications:    chlorhexidine (PERIDEX) 0.12 % solution, Use as directed 15 mLs in the mouth or throat 2 (two) times daily. , Disp: , Rfl:    furosemide (LASIX) 20 MG tablet, Take 1 tablet (20 mg total) by mouth daily., Disp: 90 tablet, Rfl: 1   levothyroxine (SYNTHROID) 50 MCG tablet, TAKE 1 TABLET (50 MCG TOTAL) BY MOUTH DAILY BEFORE BREAKFAST., Disp: 30 tablet, Rfl: 5   losartan (COZAAR) 50 MG tablet, Take 1 tablet (50 mg total) by mouth daily., Disp: 90 tablet, Rfl: 1   Melatonin 3 MG TBDP, Take by mouth., Disp: , Rfl:    metoprolol tartrate (LOPRESSOR) 25 MG tablet, Take 1 tablet (25 mg total) by mouth 2 (two) times daily., Disp: 180 tablet, Rfl: 1   Omeprazole-Sodium Bicarbonate (ZEGERID) 20-1100 MG CAPS capsule, Take 1 capsule by mouth daily before breakfast., Disp: , Rfl:    simvastatin (ZOCOR) 40 MG tablet, TAKE 1 TABLET BY MOUTH ONCE AT BEDTIME, Disp: 90 tablet, Rfl: 1   solifenacin (VESICARE) 5 MG tablet, Take 1 tablet (5 mg total) by mouth daily., Disp: 30 tablet, Rfl: 5   tamoxifen (NOLVADEX) 20 MG tablet, TAKE 1 TABLET BY MOUTH EVERY DAY, Disp: 30 tablet, Rfl: 32   TURMERIC PO, Take 500 mg by mouth daily. , Disp: , Rfl:   Current Facility-Administered Medications:    betamethasone acetate-betamethasone sodium phosphate (CELESTONE) injection 12 mg, 12 mg, Other, Once, Magnus Sinning, MD  Review of Systems:  Constitutional: Denies fever, chills, diaphoresis, appetite change and fatigue.  HEENT: Denies photophobia, eye pain, redness, hearing loss, ear pain, congestion, sore throat, rhinorrhea, sneezing, mouth sores, trouble swallowing, neck pain, neck stiffness and tinnitus.   Respiratory: Denies cough, chest tightness,  and wheezing.   Cardiovascular: Denies chest pain, palpitations. Gastrointestinal: Denies nausea, vomiting, abdominal pain, diarrhea, constipation, blood in stool and abdominal distention.  Genitourinary: Denies dysuria, urgency,  frequency, hematuria, flank pain and difficulty urinating.  Endocrine: Denies: hot or cold intolerance, sweats, changes in hair or nails, polyuria, polydipsia. Musculoskeletal: Denies myalgias, back pain, joint swelling, arthralgias and gait problem.  Skin: Denies pallor, rash and wound.  Neurological: Denies dizziness, seizures, syncope, weakness, light-headedness, numbness and headaches.  Hematological: Denies adenopathy. Easy bruising, personal or family bleeding history  Psychiatric/Behavioral: Denies suicidal ideation, mood changes, confusion, nervousness, sleep disturbance and agitation    Physical Exam: Vitals:   11/03/20 0853  BP: (!) 150/70  Pulse: 86  Temp: 97.9 F (36.6 C)  TempSrc: Oral  SpO2: 97%  Weight: 167 lb 14.4 oz (76.2 kg)    Body mass index is 32.79 kg/m.  Constitutional: NAD, calm, comfortable Eyes: PERRL, lids and conjunctivae normal ENMT: Mucous membranes are moist.  Respiratory: clear to auscultation bilaterally, no wheezing, no crackles. Normal respiratory effort. No accessory muscle use. Neurologic: Grossly intact and nonfocal Psychiatric: Normal judgment and insight. Alert and oriented x 3. Normal mood.    Impression and Plan:  Acute on chronic heart failure with preserved ejection fraction ([place])  Primary hypertension  -Echocardiogram in June 2022 with ejection fraction of 60 to 65% and grade 2 diastolic dysfunction. -I will go ahead and make some medication changes today to help with blood pressure and heart remodeling. -I have asked her to discontinue atenolol and instead go on metoprolol 25 mg twice daily. -I will add losartan 50 mg daily. -She continues to have significant lower extremity edema and some mild dyspnea on exertion, I will go ahead and ask her to discontinue hydrochlorothiazide and start Lasix 20 mg daily. -I have advised that she follow-up with Dr. Elease Hashimoto in no more than 6 weeks for follow-up on her heart failure, blood  pressure and dyspnea on exertion.  Time spent: 32 minutes reviewing chart, interviewing and examining patient and formulating plan of care.   Patient Instructions  -Nice seeing you today!!  -STOP taking atenolol and HCTZ.  -Start losartan 50 mg daily.  -Start metoprolol 25 mg twice daily.  -Start lasix 20 mg daily.  -Schedule follow up with Dr. Elease Hashimoto in 6 weeks.    [name], MD [facility] at [facility]

## 2020-11-04 ENCOUNTER — Telehealth: Payer: Self-pay | Admitting: Pharmacist

## 2020-11-04 NOTE — Telephone Encounter (Signed)
Patient called as she was confused by the medication changes from yesterday's visit and wanted to discuss them.  Discussed the purpose of all of the medications and the dosing and timing of them. Patient wrote down instructions for all and verbalized her understanding of all of the medication changes.  Recommended for patient to monitor her BP at home with these changes and keep a record. Patient is aware to call back with further questions or concerns about medications.

## 2020-11-07 DIAGNOSIS — H25811 Combined forms of age-related cataract, right eye: Secondary | ICD-10-CM | POA: Diagnosis not present

## 2020-11-09 DIAGNOSIS — M5416 Radiculopathy, lumbar region: Secondary | ICD-10-CM | POA: Diagnosis not present

## 2020-11-21 ENCOUNTER — Other Ambulatory Visit: Payer: Self-pay | Admitting: Family Medicine

## 2020-11-29 DIAGNOSIS — H268 Other specified cataract: Secondary | ICD-10-CM | POA: Diagnosis not present

## 2020-11-29 DIAGNOSIS — H25811 Combined forms of age-related cataract, right eye: Secondary | ICD-10-CM | POA: Diagnosis not present

## 2020-11-30 ENCOUNTER — Telehealth: Payer: Self-pay | Admitting: Pharmacist

## 2020-11-30 NOTE — Chronic Care Management (AMB) (Signed)
Chronic Care Management Pharmacy Assistant   Name: [name]  MRN: [id number] DOB: 1941/04/16  Reason for Encounter: Disease State/ General Assessment Call.    Conditions to be addressed/monitored: HTN and HLD   Recent office visits:  11/03/20 Stephanie Frohlich MD [facility]) - seen for chronic heart failure with preserved ejection fraction. Patient started furosemide '20mg'$  daily, losartan '50mg'$  daily, metoprolol tartrate '25mg'$  two times daily. Discontinued atenolol and hydrochlorothiazide. Follow up 6 weeks.   09/16/20 Stephanie Isaac Bliss MD (Family Medicine) - seen for bilateral leg edema. Increased hydrochlorothiazide from 12.'5mg'$  daily to '25mg'$  daily. No follow up noted.   Recent consult visits:  None.  Hospital visits:  None in previous 6 months  Medications: Outpatient Encounter Medications as of 11/30/2020  Medication Sig   chlorhexidine (PERIDEX) 0.12 % solution Use as directed 15 mLs in the mouth or throat 2 (two) times daily.    furosemide (LASIX) 20 MG tablet Take 1 tablet (20 mg total) by mouth daily.   levothyroxine (SYNTHROID) 50 MCG tablet TAKE 1 TABLET (50 MCG TOTAL) BY MOUTH DAILY BEFORE BREAKFAST.   losartan (COZAAR) 50 MG tablet Take 1 tablet (50 mg total) by mouth daily.   Melatonin 3 MG TBDP Take by mouth.   metoprolol tartrate (LOPRESSOR) 25 MG tablet Take 1 tablet (25 mg total) by mouth 2 (two) times daily.   Omeprazole-Sodium Bicarbonate (ZEGERID) 20-1100 MG CAPS capsule Take 1 capsule by mouth daily before breakfast.   simvastatin (ZOCOR) 40 MG tablet TAKE 1 TABLET BY MOUTH ONCE AT BEDTIME   solifenacin (VESICARE) 5 MG tablet Take 1 tablet (5 mg total) by mouth daily.   tamoxifen (NOLVADEX) 20 MG tablet TAKE 1 TABLET BY MOUTH EVERY DAY   TURMERIC PO Take 500 mg by mouth daily.    Facility-Administered Encounter Medications as of 11/30/2020  Medication   betamethasone acetate-betamethasone sodium phosphate (CELESTONE) injection 12 mg    Fill History: PROLENSA  0.07 % SOLN 11/25/2020 7   FUROSEMIDE 20 MG TABLET 11/03/2020 90   LEVOTHYROXINE 50 MCG TABLET 11/21/2020 90   LOSARTAN POTASSIUM 50 MG TAB 11/03/2020 90   LOTEMAX SM 0.38% OPHTH GEL 11/25/2020 14   METOPROLOL TARTRATE 25 MG TAB 11/03/2020 90   SIMVASTATIN 40 MG TABLET 10/05/2020 90   SOLIFENACIN 5 MG TABLET 10/28/2020 90   HYDROCHLOROTHIAZIDE 25 MG TAB 09/16/2020 90   Reviewed chart prior to disease state call. Spoke with patient regarding BP  Recent Office Vitals: BP Readings from Last 3 Encounters:  11/03/20 (!) 150/70  09/16/20 140/70  08/01/20 132/60   Pulse Readings from Last 3 Encounters:  11/03/20 86  09/16/20 70  08/01/20 84    Wt Readings from Last 3 Encounters:  11/03/20 167 lb 14.4 oz (76.2 kg)  09/16/20 168 lb 1.6 oz (76.2 kg)  08/01/20 165 lb 12.8 oz (75.2 kg)     Kidney Function Lab Results  Component Value Date/Time   CREATININE 0.92 (H) 09/16/2020 02:47 PM   CREATININE 0.94 08/01/2020 01:48 PM   CREATININE 1.05 08/11/2019 03:18 PM   CREATININE 0.83 03/26/2018 02:42 PM   CREATININE 1.1 01/03/2017 10:52 AM   CREATININE 1.2 (H) 07/12/2016 10:58 AM   GFR 57.41 (L) 08/01/2020 01:48 PM   GFRNONAA >60 03/26/2018 02:42 PM   GFRAA >60 03/26/2018 02:42 PM    BMP Latest Ref Rng & Units 09/16/2020 08/01/2020 08/11/2019  Glucose 65 - 99 mg/dL 92 133(H) 100(H)  BUN 7 - 25 mg/dL 22 21  34(H)  Creatinine 0.60 - 0.88 mg/dL 0.92(H) 0.94 1.05  BUN/Creat Ratio 6 - 22 (calc) 24(H) - -  Sodium 135 - 146 mmol/L 143 143 142  Potassium 3.5 - 5.3 mmol/L 4.0 3.7 4.2  Chloride 98 - 110 mmol/L 108 106 102  CO2 20 - 32 mmol/L '25 27 31  '$ Calcium 8.6 - 10.4 mg/dL 9.2 9.4 9.4    Current antihypertensive regimen:  Losartan '50mg'$  - 1 tablet by mouth daily. Metoprolol '25mg'$  - 1 tablet by mouth two times daily.  How often are you checking your Blood Pressure? infrequently Current home BP readings: none to report.  What recent interventions/DTPs  have been made by any provider to improve Blood Pressure control since last CPP Visit: patient was changed from hydrochlorothiazide and atenolol and started on metoprolol and losartan. Any recent hospitalizations or ED visits since last visit with CPP? No  Adherence Review: Is the patient currently on ACE/ARB medication? Yes Does the patient have >5 day gap between last estimated fill dates? No   Comprehensive medication review performed; Spoke to patient regarding cholesterol  Lipid Panel    Component Value Date/Time   CHOL 154 08/01/2020 1348   TRIG 324.0 (H) 08/01/2020 1348   HDL 43.90 08/01/2020 1348   LDLDIRECT 86.0 08/01/2020 1348    10-year ASCVD risk score: The ASCVD Risk score Mikey Bussing DC Jr., et al., 2013) failed to calculate for the following reasons:   The 2013 ASCVD risk score is only valid for ages 58 to 33  Current antihyperlipidemic regimen:  Simvastatin '40mg'$  - take 1 tablet by mouth at bedtime. Previous antihyperlipidemic medications tried: None. ASCVD risk enhancing conditions: age >71, HTN, and CHF What recent interventions/DTPs have been made by any provider to improve Cholesterol control since last CPP Visit: None. Any recent hospitalizations or ED visits since last visit with CPP? No  Adherence Review: Does the patient have >5 day gap between last estimated fill dates? No  Notes: Spoke with patient and reviewed all medications as prescribed. Patient reports taking all medications except for she is no longer taking melatonin or using the peridex mouth and throat rinse. Patient reports no issues from her medications. Patient states that she has not been checking her blood pressure at home or keeping a log. I re encouraged patient to start checking her blood pressure and keeping a log. Patient was agreeable. Patient inquired on where she can get a blood pressure cuff to have at home. I informed patient that she can find one for purchase at her local drug store or  [employer] for examples. Patient stated that in typical day for breakfast, lunch and dinner she usually has eggs for breakfast. For lunch she will have fast food that her husband brings home and she will have a chicken sandwich and fries usually or if she has lunch at home its a sandwich of some sort. Patient stated they eat dinner at home half the week and eat out the other half. Patient stated she does cook dinner herself and she will usually have something with a meat like chicken or fish, vegetable and a starch. Patient does not eat red meat hardly at all and she does not add salt to her food. Patient will have a soda a day but mostly drinks water the rest of the day about 4 glasses of water a day. Patient stated she does love sweets and they are her weakness. For activity patient stated she is not able to do any type of  exercise like walking due to her foot giving her issues with hurting and being swollen. Patient dies do all the housework like cleaning and is able to get around with no restrictions. Patient thanked me for my call.  Care Gaps:  AWV - scheduled for 03/17/21 Pap smear -  overdue since 09/13/2018 Covid-19 booster 4 - overdue since 04/14/20 Zoster vaccines - 09/01/20 Flu vaccine - due  Star Rating Drugs:  Losartan '50mg'$  - [facility] Simvastatin '40mg'$  - last filled on 10/05/20 90DS at [place] Pharmacist Assistant [phone]

## 2020-12-16 DIAGNOSIS — M7062 Trochanteric bursitis, left hip: Secondary | ICD-10-CM | POA: Diagnosis not present

## 2021-01-09 ENCOUNTER — Other Ambulatory Visit: Payer: Self-pay | Admitting: Family Medicine

## 2021-01-11 ENCOUNTER — Telehealth: Payer: Self-pay | Admitting: Pharmacist

## 2021-01-11 NOTE — Chronic Care Management (AMB) (Signed)
    Chronic Care Management Pharmacy Assistant   Name: [name]  MRN: [id number] DOB: 1940-06-05  Spoke with patient and rescheduled her telephone appointment with [name] the clinical pharmacist. Patient is aware and agreeable to new appointment day and time. Patient thanked me for my call.   Medications: Outpatient Encounter Medications as of 01/11/2021  Medication Sig   chlorhexidine (PERIDEX) 0.12 % solution Use as directed 15 mLs in the mouth or throat 2 (two) times daily.    furosemide (LASIX) 20 MG tablet Take 1 tablet (20 mg total) by mouth daily.   levothyroxine (SYNTHROID) 50 MCG tablet TAKE 1 TABLET (50 MCG TOTAL) BY MOUTH DAILY BEFORE BREAKFAST.   losartan (COZAAR) 50 MG tablet Take 1 tablet (50 mg total) by mouth daily.   Melatonin 3 MG TBDP Take by mouth.   metoprolol tartrate (LOPRESSOR) 25 MG tablet Take 1 tablet (25 mg total) by mouth 2 (two) times daily.   Omeprazole-Sodium Bicarbonate (ZEGERID) 20-1100 MG CAPS capsule Take 1 capsule by mouth daily before breakfast.   simvastatin (ZOCOR) 40 MG tablet TAKE 1 TABLET BY MOUTH AT BEDTIME   solifenacin (VESICARE) 5 MG tablet Take 1 tablet (5 mg total) by mouth daily.   tamoxifen (NOLVADEX) 20 MG tablet TAKE 1 TABLET BY MOUTH EVERY DAY   TURMERIC PO Take 500 mg by mouth daily.    Facility-Administered Encounter Medications as of 01/11/2021  Medication   betamethasone acetate-betamethasone sodium phosphate (CELESTONE) injection 12 mg    Care Gaps:  AWV - scheduled for 03/17/21 Pap smear - overdue since 09/13/18 Covid-19 vaccine booster 4 - overdue since 04/07/20 Zoster vaccines - overdue since 09/01/20 Flu vaccine - due  Star Rating Drugs:  Losartan 50mg  - [facility] Simvastatin 40mg  - last filled on 10/05/20 90DS at [place] Pharmacist Assistant [phone]

## 2021-01-16 ENCOUNTER — Other Ambulatory Visit: Payer: Self-pay | Admitting: Family Medicine

## 2021-01-16 DIAGNOSIS — Z9889 Other specified postprocedural states: Secondary | ICD-10-CM

## 2021-01-26 DIAGNOSIS — M25552 Pain in left hip: Secondary | ICD-10-CM | POA: Diagnosis not present

## 2021-01-26 DIAGNOSIS — M48062 Spinal stenosis, lumbar region with neurogenic claudication: Secondary | ICD-10-CM | POA: Diagnosis not present

## 2021-01-26 DIAGNOSIS — M25562 Pain in left knee: Secondary | ICD-10-CM | POA: Diagnosis not present

## 2021-01-29 ENCOUNTER — Other Ambulatory Visit: Payer: Self-pay | Admitting: Family Medicine

## 2021-01-29 DIAGNOSIS — R3915 Urgency of urination: Secondary | ICD-10-CM

## 2021-02-15 ENCOUNTER — Other Ambulatory Visit: Payer: Self-pay

## 2021-02-15 ENCOUNTER — Encounter (HOSPITAL_BASED_OUTPATIENT_CLINIC_OR_DEPARTMENT_OTHER): Payer: Self-pay

## 2021-02-15 ENCOUNTER — Emergency Department (HOSPITAL_BASED_OUTPATIENT_CLINIC_OR_DEPARTMENT_OTHER): Payer: Medicare Other | Admitting: Radiology

## 2021-02-15 DIAGNOSIS — Z79899 Other long term (current) drug therapy: Secondary | ICD-10-CM | POA: Insufficient documentation

## 2021-02-15 DIAGNOSIS — Z853 Personal history of malignant neoplasm of breast: Secondary | ICD-10-CM | POA: Diagnosis not present

## 2021-02-15 DIAGNOSIS — E039 Hypothyroidism, unspecified: Secondary | ICD-10-CM | POA: Insufficient documentation

## 2021-02-15 DIAGNOSIS — M25562 Pain in left knee: Secondary | ICD-10-CM | POA: Insufficient documentation

## 2021-02-15 DIAGNOSIS — I10 Essential (primary) hypertension: Secondary | ICD-10-CM | POA: Insufficient documentation

## 2021-02-15 NOTE — ED Triage Notes (Signed)
Patient here POV from Home with Left Knee Pain.  Patient states she began having Left Knee Pain approximately 5 hours PTA. Patient does not explicitly recall recent Injury but states she may have twisted it.  NAD Noted during Triage. BIB Wheelchair. A&Ox4. GCS 15.

## 2021-02-16 ENCOUNTER — Emergency Department (HOSPITAL_BASED_OUTPATIENT_CLINIC_OR_DEPARTMENT_OTHER)
Admission: EM | Admit: 2021-02-16 | Discharge: 2021-02-16 | Disposition: A | Discharge status: Home / Self Care | Payer: Medicare Other | Attending: Emergency Medicine | Admitting: Emergency Medicine

## 2021-02-16 DIAGNOSIS — M25562 Pain in left knee: Secondary | ICD-10-CM

## 2021-02-16 MED ORDER — HYDROCODONE-ACETAMINOPHEN 5-325 MG PO TABS: 0.5000 | ORAL_TABLET | ORAL | 0 refills | Status: DC | PRN

## 2021-02-16 MED ORDER — MELOXICAM 7.5 MG PO TABS: ORAL_TABLET | ORAL | 0 refills | Status: DC

## 2021-02-16 MED ORDER — HYDROCODONE-ACETAMINOPHEN 5-325 MG PO TABS
1.0000 | ORAL_TABLET | Freq: Once | ORAL | Status: AC
  Administered 2021-02-16: 1 via ORAL
  Filled 2021-02-16: qty 1

## 2021-02-16 NOTE — ED Provider Notes (Signed)
[facility] Provider Note: Georgena Spurling, MD, FACEP  CSN: [id number] MRN: [id number] ARRIVAL: 02/15/21 at 2004 ROOM: [place]  Knee Pain   HISTORY OF PRESENT ILLNESS  02/16/21 12:53 AM [name] is a 80 y.o. female who developed left knee pain approximately 5 hours prior to arrival.  She does not recall a specific injury.  She rates the pain as a 10 out of 10, worse with movement or weightbearing.  She has an appointment pending with Dr. Wynelle Link of [facility] on December 17.   Past Medical History:  Diagnosis Date   Anemia    Anxiety    Arthritis    knees   Atrophic vaginitis    Cancer ([place])    breast cancer - left   Diverticulosis    Endometriosis    Family history of breast cancer    Family history of colon cancer    GERD (gastroesophageal reflux disease)    Headache(784.0)    Heart murmur    as a child only   History of kidney stones    passed stones, no surgery required   History of radiation therapy 01/30/16- 03/02/16   Left Breast 50 Gy in 25 fractions.    Hyperlipidemia    Hypertension    Hypothyroidism    Internal hemorrhoids    Osteopenia    Osteoporosis    Personal history of chemotherapy    Personal history of radiation therapy    Rheumatic fever    age 41   Sleep apnea    " very mild" does not wear CPAP   Thyroid disease    Hypothyroid   Ulcer    UTI (lower urinary tract infection)     Past Surgical History:  Procedure Laterality Date   APPENDECTOMY     BREAST BIOPSY Left 08/19/2015   BREAST LUMPECTOMY Left 09/08/2015   BREAST LUMPECTOMY WITH RADIOACTIVE SEED AND SENTINEL LYMPH NODE BIOPSY Left 09/09/2015   Procedure: LEFT BREAST LUMPECTOMY WITH RADIOACTIVE SEED AND SENTINEL LYMPH NODE BIOPSY;  Surgeon: Jackolyn Confer, MD;  Location: [facility];  Service: General;  Laterality: Left;   COLONOSCOPY     DILATATION & CURETTAGE/HYSTEROSCOPY WITH MYOSURE N/A 10/09/2016   Procedure: [place];  Surgeon: Princess Bruins, MD;  Location: [facility];  Service: Gynecology;  Laterality: N/A;  requesting 7:30am in our block  requests one hour   ESOPHAGOGASTRODUODENOSCOPY ENDOSCOPY     IR GENERIC HISTORICAL  12/05/2015   IR CV LINE INJECTION 12/05/2015 [name], MD [facility]   LAPAROSCOPIC ENDOMETRIOSIS FULGURATION  1978   PELVIC LAPAROSCOPY     PORTACATH PLACEMENT N/A 10/04/2015   Procedure: INSERTION PORT-A-CATH WITH ULTRASOUND;  Surgeon: Jackolyn Confer, MD;  Location: [facility];  Service: General;  Laterality: N/A;   portacath removal     TONSILLECTOMY     ULNAR NERVE REPAIR  2010    Family History  Problem Relation Age of Onset   Breast cancer Mother        Age 31   Hypertension Mother    Heart disease Father    Stroke Father    Colon cancer Father        dx 39s   Breast cancer Paternal Grandmother        Age 39   Diabetes Paternal Grandmother    Heart disease Maternal Grandmother     Social History   Tobacco Use   Smoking status: Never   Smokeless tobacco: Never  Vaping Use   Vaping Use: Never used  Substance Use Topics   Alcohol use: No   Drug use: No    Prior to Admission medications   Medication Sig Start Date End Date Taking? Authorizing Provider  chlorhexidine (PERIDEX) 0.12 % solution Use as directed 15 mLs in the mouth or throat 2 (two) times daily.  08/30/15   [provider]  furosemide (LASIX) 20 MG tablet Take 1 tablet (20 mg total) by mouth daily. 11/03/20   Isaac Bliss, Rayford Halsted, MD  levothyroxine (SYNTHROID) 50 MCG tablet TAKE 1 TABLET (50 MCG TOTAL) BY MOUTH DAILY BEFORE BREAKFAST. 11/21/20   Burchette, Alinda Sierras, MD  losartan (COZAAR) 50 MG tablet Take 1 tablet (50 mg total) by mouth daily. 11/03/20   Isaac Bliss, Rayford Halsted, MD  Melatonin 3 MG TBDP Take by mouth. 01/20/18   [provider]  metoprolol tartrate (LOPRESSOR) 25 MG tablet Take 1 tablet (25 mg total) by mouth 2 (two) times  daily. 11/03/20   Isaac Bliss, Rayford Halsted, MD  Omeprazole-Sodium Bicarbonate (ZEGERID) 20-1100 MG CAPS capsule Take 1 capsule by mouth daily before breakfast.    [provider]  simvastatin (ZOCOR) 40 MG tablet TAKE 1 TABLET BY MOUTH AT BEDTIME 01/09/21   Burchette, Alinda Sierras, MD  solifenacin (VESICARE) 5 MG tablet TAKE 1 TABLET (5 MG TOTAL) BY MOUTH DAILY. 02/01/21   Burchette, Alinda Sierras, MD  tamoxifen (NOLVADEX) 20 MG tablet TAKE 1 TABLET BY MOUTH EVERY DAY 05/07/19   [name], [name], MD  TURMERIC PO Take 500 mg by mouth daily.     [provider]    Allergies Nitrofurantoin and Sulfamethoxazole-trimethoprim   REVIEW OF SYSTEMS  Negative except as noted here or in the History of Present Illness.   PHYSICAL EXAMINATION  Initial Vital Signs Blood pressure (!) 202/67, pulse 100, temperature 98 F (36.7 C), temperature source Oral, resp. rate 16, height 5' (1.524 m), weight 76.2 kg, SpO2 100 %.  Examination General: Well-developed, well-nourished female in no acute distress; appearance consistent with age of record HENT: normocephalic; atraumatic Eyes: pupils equal, round and reactive to light; extraocular muscles intact; bilateral pseudophakia Neck: supple Heart: regular rate and rhythm Lungs: clear to auscultation bilaterally Abdomen: soft; nondistended; nontender; bowel sounds present Extremities: No deformity; tenderness of left knee with small effusion, no erythema or warmth; trace edema of lower legs Neurologic: Awake, alert and oriented; motor function intact in all extremities and symmetric; no facial droop Skin: Warm and dry Psychiatric: Normal mood and affect   RESULTS  Summary of this visit's results, reviewed and interpreted by myself:   EKG Interpretation  Date/Time:    Ventricular Rate:    PR Interval:    QRS Duration:   QT Interval:    QTC Calculation:   R Axis:     Text Interpretation:         Laboratory Studies: No results found  for this or any previous visit (from the past 24 hour(s)). Imaging Studies: DG Knee Complete 4 Views Left  Result Date: 02/15/2021 CLINICAL DATA:  Left knee pain. EXAM: LEFT KNEE - COMPLETE 4+ VIEW COMPARISON:  None. FINDINGS: No evidence of an acute fracture or dislocation. There is moderate severity medial and lateral tibiofemoral compartment space narrowing. Mild to moderate severity patellofemoral narrowing is also seen. A small joint effusion is present. IMPRESSION: Moderate severity tricompartmental degenerative changes with a small joint effusion. Electronically Signed   By: Virgina Norfolk M.D.   On: 02/15/2021  22:04    ED COURSE and MDM  Nursing notes, initial and subsequent vitals signs, including pulse oximetry, reviewed and interpreted by myself.  Vitals:   02/15/21 2141  BP: (!) 202/67  Pulse: 100  Resp: 16  Temp: 98 F (36.7 C)  TempSrc: Oral  SpO2: 100%  Weight: 76.2 kg  Height: 5' (1.524 m)   Medications  HYDROcodone-acetaminophen (NORCO/VICODIN) 5-325 MG per tablet 1 tablet (has no administration in time range)    The patient's effusion does not appear large enough to warrant emergent arthrocentesis in the ED.  We will place her in a knee brace and treat her with analgesics.  She was advised to contact [facility] and move her appointment up as she may benefit from a steroid injection.  Then radiographic findings her pain is likely due to arthritic changes.  I doubt acute septic knee.  PROCEDURES  Procedures   ED DIAGNOSES     ICD-10-CM   1. Acute pain of left knee  M25.562          Gable Odonohue, Jenny Reichmann, MD 02/16/21 0104

## 2021-02-21 ENCOUNTER — Other Ambulatory Visit: Payer: Self-pay | Admitting: Internal Medicine

## 2021-02-21 ENCOUNTER — Telehealth: Payer: Self-pay | Admitting: Pharmacist

## 2021-02-21 DIAGNOSIS — I5033 Acute on chronic diastolic (congestive) heart failure: Secondary | ICD-10-CM

## 2021-02-21 NOTE — Chronic Care Management (AMB) (Signed)
Chronic Care Management Pharmacy Assistant   Name: [name]  MRN: [id number] DOB: 03-14-1941  Reason for Encounter: Disease State/ General Assessment Call.   Conditions to be addressed/monitored: HTN and HLD  Primary concerns for visit include: Medication adherence for Simvastatin.  Recent office visits:    Recent consult visits:    Hospital visits:  Medication Reconciliation was completed by comparing discharge summary, patient's EMR and Pharmacy list, and upon discussion with patient.  Patient visited [facility] on 02/16/21 for 1 hour due to acute pain of left knee.  New?Medications Started at [facility] Discharge:?? -started HYDROcodone-acetaminophen 5-325 MG Tablet - Take 0.5 tablets by mouth every 4 (four) hours as needed for severe pain and Meloxicam 7.5mg  daily for pain  Medication Changes at Hospital Discharge: -Changed None.  Medications Discontinued at Hospital Discharge: -Stopped None.  Medications that remain the same after Hospital Discharge:??  -All other medications will remain the same.    Medications: Outpatient Encounter Medications as of 02/21/2021  Medication Sig   chlorhexidine (PERIDEX) 0.12 % solution Use as directed 15 mLs in the mouth or throat 2 (two) times daily.    furosemide (LASIX) 20 MG tablet Take 1 tablet (20 mg total) by mouth daily.   HYDROcodone-acetaminophen (NORCO) 5-325 MG tablet Take 0.5 tablets by mouth every 4 (four) hours as needed for severe pain.   levothyroxine (SYNTHROID) 50 MCG tablet TAKE 1 TABLET (50 MCG TOTAL) BY MOUTH DAILY BEFORE BREAKFAST.   losartan (COZAAR) 50 MG tablet Take 1 tablet (50 mg total) by mouth daily.   Melatonin 3 MG TBDP Take by mouth.   meloxicam (MOBIC) 7.5 MG tablet Take 1 tablet daily for knee pain.   metoprolol tartrate (LOPRESSOR) 25 MG tablet Take 1 tablet (25 mg total) by mouth 2 (two) times daily.   Omeprazole-Sodium Bicarbonate (ZEGERID)  20-1100 MG CAPS capsule Take 1 capsule by mouth daily before breakfast.   simvastatin (ZOCOR) 40 MG tablet TAKE 1 TABLET BY MOUTH AT BEDTIME   solifenacin (VESICARE) 5 MG tablet TAKE 1 TABLET (5 MG TOTAL) BY MOUTH DAILY.   tamoxifen (NOLVADEX) 20 MG tablet TAKE 1 TABLET BY MOUTH EVERY DAY   TURMERIC PO Take 500 mg by mouth daily.    Facility-Administered Encounter Medications as of 02/21/2021  Medication   betamethasone acetate-betamethasone sodium phosphate (CELESTONE) injection 12 mg   Fill History: FUROSEMIDE 20 MG TABLET 01/29/2021 90   HYDROCODONE-ACETAMIN 5-325 MG 02/16/2021 5   LEVOTHYROXINE 50 MCG TABLET 11/21/2020 90   LOSARTAN POTASSIUM 50 MG TAB 01/29/2021 90   MELOXICAM 7.5 MG TABLET 02/16/2021 15   METOPROLOL TARTRATE 25 MG TAB 01/29/2021 90   SIMVASTATIN 40 MG TABLET 10/05/2020 90   SOLIFENACIN 5 MG TABLET 02/01/2021 90   HYDROCHLOROTHIAZIDE 25 MG TAB 09/16/2020 90   Reviewed chart prior to disease state call. Spoke with patient regarding BP  Recent Office Vitals: BP Readings from Last 3 Encounters:  02/16/21 (!) 185/68  11/03/20 (!) 150/70  09/16/20 140/70   Pulse Readings from Last 3 Encounters:  02/16/21 96  11/03/20 86  09/16/20 70    Wt Readings from Last 3 Encounters:  02/15/21 167 lb 15.9 oz (76.2 kg)  11/03/20 167 lb 14.4 oz (76.2 kg)  09/16/20 168 lb 1.6 oz (76.2 kg)     Kidney Function Lab Results  Component Value Date/Time   CREATININE 0.92 (H) 09/16/2020 02:47 PM   CREATININE 0.94 08/01/2020 01:48 PM   CREATININE 1.05 08/11/2019 03:18 PM  CREATININE 0.83 03/26/2018 02:42 PM   CREATININE 1.1 01/03/2017 10:52 AM   CREATININE 1.2 (H) 07/12/2016 10:58 AM   GFR 57.41 (L) 08/01/2020 01:48 PM   GFRNONAA >60 03/26/2018 02:42 PM   GFRAA >60 03/26/2018 02:42 PM    BMP Latest Ref Rng & Units 09/16/2020 08/01/2020 08/11/2019  Glucose 65 - 99 mg/dL 92 133(H) 100(H)  BUN 7 - 25 mg/dL 22 21 34(H)  Creatinine 0.60 - 0.88 mg/dL 0.92(H) 0.94 1.05   BUN/Creat Ratio 6 - 22 (calc) 24(H) - -  Sodium 135 - 146 mmol/L 143 143 142  Potassium 3.5 - 5.3 mmol/L 4.0 3.7 4.2  Chloride 98 - 110 mmol/L 108 106 102  CO2 20 - 32 mmol/L 25 27 31   Calcium 8.6 - 10.4 mg/dL 9.2 9.4 9.4    Current antihypertensive regimen:  Losartan 50mg  - 1 tablet by mouth daily. Metoprolol 25mg  - 1 tablet by mouth two times daily.  How often are you checking your Blood Pressure? infrequently Current home BP readings: 155/79 01/14/21, 01/22/21 155/74. Has not taken since.  What recent interventions/DTPs have been made by any provider to improve Blood Pressure control since last CPP Visit: None. Any recent hospitalizations or ED visits since last visit with CPP? Yes  Adherence Review: Is the patient currently on ACE/ARB medication? Yes Does the patient have >5 day gap between last estimated fill dates? No   Comprehensive medication review performed; Spoke to patient regarding cholesterol  Lipid Panel    Component Value Date/Time   CHOL 154 08/01/2020 1348   TRIG 324.0 (H) 08/01/2020 1348   HDL 43.90 08/01/2020 1348   LDLDIRECT 86.0 08/01/2020 1348    10-year ASCVD risk score: The ASCVD Risk score (Arnett DK, et al., 2019) failed to calculate for the following reasons:   The 2019 ASCVD risk score is only valid for ages 54 to 39  Current antihyperlipidemic regimen:  Simvastatin 40mg  - take 1 tablet by mouth at bedtime. Previous antihyperlipidemic medications tried: None. ASCVD risk enhancing conditions: age >22, HTN, and CHF What recent interventions/DTPs have been made by any provider to improve Cholesterol control since last CPP Visit: None. Any recent hospitalizations or ED visits since last visit with CPP? Yes  Adherence Review: Does the patient have >5 day gap between last estimated fill dates? Yes    Notes: [facility] with [name] at [facility] [address] who stated that patient has a fill available for her simvastatin 40mg . Patient states she still has pills at  home. She takes it everyday and said she does not need it filled right now. She stated she combined two bottles yesterday and she did not check the date and does not have the bottle the older ones came out of. Patient states she has 65 tablets currently.  Patient reports that she is taking all her other current medications. Patient has been having trouble with her knee hurting and has not been able to do a lot of walking but she still does all her housework. Patient does not add salt to her food and eats red meat a couple times a week. Patient stated she could do better on her water intake and she does not eat much fish like salmon or tuna. I encouraged patient to check her blood pressure weekly and document the readings. Patient was agreeable and thanked me for my call.    Care Gaps:  AWV - scheduled for 03/17/21 Pap smear -  overdue since 09/13/2018 Covid-19 booster 4 - overdue since  04/14/20 Zoster vaccines - 09/01/20 Flu vaccine - due Last PCP BP: 150/70 P: 86 on 11/03/20  Star Rating Drugs:  Losartan 50mg  - [facility] Sim[facility]atin 40mg  - last filled on 10/05/20 90DS at [place] Pharmacist Assistant [phone]

## 2021-03-08 ENCOUNTER — Ambulatory Visit (INDEPENDENT_AMBULATORY_CARE_PROVIDER_SITE_OTHER): Payer: Medicare Other | Admitting: Family Medicine

## 2021-03-08 VITALS — BP 160/70 | HR 96 | Temp 98.2°F | Wt 169.0 lb

## 2021-03-08 DIAGNOSIS — G3184 Mild cognitive impairment, so stated: Secondary | ICD-10-CM

## 2021-03-08 DIAGNOSIS — M1712 Unilateral primary osteoarthritis, left knee: Secondary | ICD-10-CM | POA: Diagnosis not present

## 2021-03-08 DIAGNOSIS — M25462 Effusion, left knee: Secondary | ICD-10-CM | POA: Diagnosis not present

## 2021-03-08 DIAGNOSIS — I10 Essential (primary) hypertension: Secondary | ICD-10-CM

## 2021-03-08 NOTE — Progress Notes (Signed)
Established Patient Office Visit  Subjective:  Patient ID: [name], female    DOB: 1941-01-26  Age: 80 y.o. MRN: [id number]  CC:  Chief Complaint  Patient presents with   Knee Pain    X 1 week, no injury, only when moving    HPI [name] presents for left knee pain.  She states initially this is for 1 week.  However, in looking over records she was seen in the ER on 02-15-2021 for left knee pain which makes 3 weeks.  She seems a little bit uncertain regarding several details including recent medications that were prescribed.  She was seen in the ER and had x-rays which showed some moderate arthritis changes.  No arthrocentesis done according to the notes although patient thinks this was done.  In general, she seems uncertain regarding several items.  She does not have any known history of dementia.  She does not recall any injuries.  No locking or giving way.  No known history of gout or pseudogout.  She was prescribed meloxicam 7.5 mg daily but not sure if she took this or not.  She has hypertension treated with losartan and Lopressor.  She does state that she forgot to take her medications this morning and blood pressure is up at this time.  Denies any headaches.  No chest pains.  No peripheral edema.  Past Medical History:  Diagnosis Date   Anemia    Anxiety    Arthritis    knees   Atrophic vaginitis    Cancer ([place])    breast cancer - left   Diverticulosis    Endometriosis    Family history of breast cancer    Family history of colon cancer    GERD (gastroesophageal reflux disease)    Headache(784.0)    Heart murmur    as a child only   History of kidney stones    passed stones, no surgery required   History of radiation therapy 01/30/16- 03/02/16   Left Breast 50 Gy in 25 fractions.    Hyperlipidemia    Hypertension    Hypothyroidism    Internal hemorrhoids    Osteopenia    Osteoporosis    Personal history of chemotherapy    Personal history  of radiation therapy    Rheumatic fever    age 55   Sleep apnea    " very mild" does not wear CPAP   Thyroid disease    Hypothyroid   Ulcer    UTI (lower urinary tract infection)     Past Surgical History:  Procedure Laterality Date   APPENDECTOMY     BREAST BIOPSY Left 08/19/2015   BREAST LUMPECTOMY Left 09/08/2015   BREAST LUMPECTOMY WITH RADIOACTIVE SEED AND SENTINEL LYMPH NODE BIOPSY Left 09/09/2015   Procedure: LEFT BREAST LUMPECTOMY WITH RADIOACTIVE SEED AND SENTINEL LYMPH NODE BIOPSY;  Surgeon: Jackolyn Confer, MD;  Location: [facility];  Service: General;  Laterality: Left;   COLONOSCOPY     DILATATION & CURETTAGE/HYSTEROSCOPY WITH MYOSURE N/A 10/09/2016   Procedure: [place];  Surgeon: Princess Bruins, MD;  Location: [facility];  Service: Gynecology;  Laterality: N/A;  requesting 7:30am in our block  requests one hour   ESOPHAGOGASTRODUODENOSCOPY ENDOSCOPY     IR GENERIC HISTORICAL  12/05/2015   IR CV LINE INJECTION 12/05/2015 [name], MD [facility]   LAPAROSCOPIC ENDOMETRIOSIS FULGURATION  1978   PELVIC LAPAROSCOPY     PORTACATH PLACEMENT N/A 10/04/2015  Procedure: INSERTION PORT-A-CATH WITH ULTRASOUND;  Surgeon: Jackolyn Confer, MD;  Location: [facility];  Service: General;  Laterality: N/A;   portacath removal     TONSILLECTOMY     ULNAR NERVE REPAIR  2010    Family History  Problem Relation Age of Onset   Breast cancer Mother        Age 72   Hypertension Mother    Heart disease Father    Stroke Father    Colon cancer Father        dx 26s   Breast cancer Paternal Grandmother        Age 7   Diabetes Paternal Grandmother    Heart disease Maternal Grandmother     Social History   Socioeconomic History   Marital status: Married    Spouse name: Not on file   Number of children: 0   Years of education: Not on file   Highest education level: Not on file  Occupational History   Occupation: retired  Tobacco Use   Smoking  status: Never   Smokeless tobacco: Never  Vaping Use   Vaping Use: Never used  Substance and Sexual Activity   Alcohol use: No   Drug use: No   Sexual activity: Never    Birth control/protection: Post-menopausal  Other Topics Concern   Not on file  Social History Narrative   Retired Oncologist.    Social Determinants of Health   Financial Resource Strain: Low Risk    Difficulty of Paying Living Expenses: Not hard at all  Food Insecurity: No Food Insecurity   Worried About Charity fundraiser in the Last Year: Never true   [place] in the Last Year: Never true  Transportation Needs: No Transportation Needs   Lack of Transportation (Medical): No   Lack of Transportation (Non-Medical): No  Physical Activity: Inactive   Days of Exercise per Week: 0 days   Minutes of Exercise per Session: 0 min  Stress: No Stress Concern Present   Feeling of Stress : Not at all  Social Connections: Moderately Integrated   Frequency of Communication with Friends and Family: More than three times a week   Frequency of Social Gatherings with Friends and Family: Three times a week   Attends Religious Services: More than 4 times per year   Active Member of Clubs or Organizations: No   Attends Archivist Meetings: Never   Marital Status: Married  Human resources officer Violence: Not At Risk   Fear of Current or Ex-Partner: No   Emotionally Abused: No   Physically Abused: No   Sexually Abused: No    Outpatient Medications Prior to Visit  Medication Sig Dispense Refill   chlorhexidine (PERIDEX) 0.12 % solution Use as directed 15 mLs in the mouth or throat 2 (two) times daily.      furosemide (LASIX) 20 MG tablet TAKE 1 TABLET BY MOUTH EVERY DAY 90 tablet 1   HYDROcodone-acetaminophen (NORCO) 5-325 MG tablet Take 0.5 tablets by mouth every 4 (four) hours as needed for severe pain. 15 tablet 0   levothyroxine (SYNTHROID) 50 MCG tablet TAKE 1 TABLET (50 MCG TOTAL) BY MOUTH DAILY  BEFORE BREAKFAST. 90 tablet 1   losartan (COZAAR) 50 MG tablet TAKE 1 TABLET BY MOUTH EVERY DAY 90 tablet 1   Melatonin 3 MG TBDP Take by mouth.     meloxicam (MOBIC) 7.5 MG tablet Take 1 tablet daily for knee pain. 15 tablet 0   metoprolol tartrate (  LOPRESSOR) 25 MG tablet TAKE 1 TABLET BY MOUTH TWICE A DAY 180 tablet 1   Omeprazole-Sodium Bicarbonate (ZEGERID) 20-1100 MG CAPS capsule Take 1 capsule by mouth daily before breakfast.     simvastatin (ZOCOR) 40 MG tablet TAKE 1 TABLET BY MOUTH AT BEDTIME 90 tablet 1   solifenacin (VESICARE) 5 MG tablet TAKE 1 TABLET (5 MG TOTAL) BY MOUTH DAILY. 90 tablet 1   tamoxifen (NOLVADEX) 20 MG tablet TAKE 1 TABLET BY MOUTH EVERY DAY 30 tablet 32   TURMERIC PO Take 500 mg by mouth daily.      Facility-Administered Medications Prior to Visit  Medication Dose Route Frequency Provider Last Rate Last Admin   betamethasone acetate-betamethasone sodium phosphate (CELESTONE) injection 12 mg  12 mg Other Once Magnus Sinning, MD        Allergies  Allergen Reactions   Nitrofurantoin Nausea And Vomiting    GI upset   Sulfamethoxazole-Trimethoprim Nausea And Vomiting and Other (See Comments)    GI upset    ROS Review of Systems  Constitutional:  Negative for chills, fatigue and fever.  Eyes:  Negative for visual disturbance.  Respiratory:  Negative for cough, chest tightness, shortness of breath and wheezing.   Cardiovascular:  Negative for chest pain, palpitations and leg swelling.  Musculoskeletal:  Positive for arthralgias.  Neurological:  Negative for dizziness, seizures, syncope, weakness, light-headedness and headaches.     Objective:    Physical Exam Vitals reviewed.  Constitutional:      Appearance: Normal appearance.  Cardiovascular:     Comments: Regular rhythm.  Rate around 96-100 but patient did not take her Lopressor this morning Pulmonary:     Effort: Pulmonary effort is normal.     Breath sounds: Normal breath sounds.   Musculoskeletal:     Comments: She has small effusion left knee.  No warmth.  No erythema.  No ecchymosis.  Full range of motion.  No localized tenderness.  Neurological:     Mental Status: She is alert.    BP (!) 160/70 (BP Location: Left Arm, Patient Position: Sitting, Cuff Size: Normal)   Pulse 96   Temp 98.2 F (36.8 C) (Oral)   Wt 169 lb (76.7 kg)   SpO2 99%   BMI 33.01 kg/m  Wt Readings from Last 3 Encounters:  03/08/21 169 lb (76.7 kg)  02/15/21 167 lb 15.9 oz (76.2 kg)  11/03/20 167 lb 14.4 oz (76.2 kg)     Health Maintenance Due  Topic Date Due   PAP SMEAR-Modifier  09/13/2018   COVID-19 Vaccine (4 - Booster for Pfizer series) 03/10/2020   Zoster Vaccines- Shingrix (2 of 2) 09/01/2020   INFLUENZA VACCINE  11/14/2020    There are no preventive care reminders to display for this patient.  Lab Results  Component Value Date   TSH 1.48 08/01/2020   Lab Results  Component Value Date   WBC 4.9 03/26/2018   HGB 11.6 (L) 03/26/2018   HCT 37.6 03/26/2018   MCV 85.5 03/26/2018   PLT 214 03/26/2018   Lab Results  Component Value Date   NA 143 09/16/2020   K 4.0 09/16/2020   CHLORIDE 103 01/03/2017   CO2 25 09/16/2020   GLUCOSE 92 09/16/2020   BUN 22 09/16/2020   CREATININE 0.92 (H) 09/16/2020   BILITOT 0.4 09/16/2020   ALKPHOS 62 08/01/2020   AST 29 09/16/2020   ALT 22 09/16/2020   PROT 6.6 09/16/2020   ALBUMIN 4.0 08/01/2020   CALCIUM 9.2 09/16/2020  ANIONGAP 11 03/26/2018   EGFR 46 (L) 01/03/2017   GFR 57.41 (L) 08/01/2020   Lab Results  Component Value Date   CHOL 154 08/01/2020   Lab Results  Component Value Date   HDL 43.90 08/01/2020   No results found for: [facility] Lab Results  Component Value Date   TRIG 324.0 (H) 08/01/2020   Lab Results  Component Value Date   CHOLHDL 4 08/01/2020   Lab Results  Component Value Date   HGBA1C 5.8 11/15/2014      Assessment & Plan:   #1 left knee pain with small effusion.  She does have  some moderate arthritis on recent x-rays but not clear if this is causing her pain.  She does not recall any recent injury. -We recommended trial over-the-counter topical Voltaren/diclofenac gel 3-4 times daily -Not clear if she took recent meloxicam -Keep orthopedic follow-up as scheduled  #2 hypertension.  Poorly controlled by today's visit but patient did not take her Lopressor or losartan this morning.  Get back on regular medications and set up 22-monthfollow-up  #3 we noticed some mild cognitive changes today on exam.  She seemed a little bit uncertain regarding medications and recent history.  We recommended setting up 30-minute follow-up within the next several weeks and obtain further cognitive assessment at that time and possibly some labs   No orders of the defined types were placed in this encounter.   Follow-up: Return in about 2 months (around 05/08/2021).    BCarolann Littler MD

## 2021-03-08 NOTE — Patient Instructions (Addendum)
Consider trial of DICLOFENAC/VOLTAREN gel and use at least 3 times daily to left knee  Get back on daily blood pressure medication.  If BP not consistently < 140/90 on medication let me know.   Set up 2 month follow up- 30 minute appt.

## 2021-03-16 ENCOUNTER — Telehealth: Payer: Medicare Other

## 2021-03-17 ENCOUNTER — Ambulatory Visit (INDEPENDENT_AMBULATORY_CARE_PROVIDER_SITE_OTHER): Payer: Medicare Other

## 2021-03-17 VITALS — BP 138/60 | HR 74 | Temp 97.6°F | Ht 60.0 in | Wt 168.9 lb

## 2021-03-17 DIAGNOSIS — Z23 Encounter for immunization: Secondary | ICD-10-CM | POA: Diagnosis not present

## 2021-03-17 DIAGNOSIS — Z0000 Encounter for general adult medical examination without abnormal findings: Secondary | ICD-10-CM

## 2021-03-17 NOTE — Progress Notes (Signed)
Subjective:   [name] is a 80 y.o. female who presents for Medicare Annual (Subsequent) preventive examination.  Review of Systems    No ROS Cardiac Risk Factors include: advanced age (>86men, >52 women);hypertension    Objective:    Today's Vitals   03/17/21 1404  BP: 138/60  Pulse: 74  Temp: 97.6 F (36.4 C)  Weight: 168 lb 14.4 oz (76.6 kg)  Height: 5' (1.524 m)   Body mass index is 32.99 kg/m.  Advanced Directives 03/17/2021 02/15/2021 03/16/2020 10/02/2016 09/23/2016 07/21/2016 04/06/2016  Does Patient Have a Medical Advance Directive? Yes No No No No No No  Type of Advance Directive Living will - - - - - -  Does patient want to make changes to medical advance directive? No - Patient declined - - - - - -  Would patient like information on creating a medical advance directive? - No - Patient declined No - Patient declined Yes (MAU/Ambulatory/Procedural Areas - Information given) No - Patient declined No - Patient declined No - Patient declined    Current Medications (verified) Outpatient Encounter Medications as of 03/17/2021  Medication Sig   chlorhexidine (PERIDEX) 0.12 % solution Use as directed 15 mLs in the mouth or throat 2 (two) times daily.    furosemide (LASIX) 20 MG tablet TAKE 1 TABLET BY MOUTH EVERY DAY   HYDROcodone-acetaminophen (NORCO) 5-325 MG tablet Take 0.5 tablets by mouth every 4 (four) hours as needed for severe pain.   levothyroxine (SYNTHROID) 50 MCG tablet TAKE 1 TABLET (50 MCG TOTAL) BY MOUTH DAILY BEFORE BREAKFAST.   losartan (COZAAR) 50 MG tablet TAKE 1 TABLET BY MOUTH EVERY DAY   Melatonin 3 MG TBDP Take by mouth.   meloxicam (MOBIC) 7.5 MG tablet Take 1 tablet daily for knee pain.   metoprolol tartrate (LOPRESSOR) 25 MG tablet TAKE 1 TABLET BY MOUTH TWICE A DAY   Omeprazole-Sodium Bicarbonate (ZEGERID) 20-1100 MG CAPS capsule Take 1 capsule by mouth daily before breakfast.   simvastatin (ZOCOR) 40 MG tablet TAKE 1 TABLET BY MOUTH AT  BEDTIME   solifenacin (VESICARE) 5 MG tablet TAKE 1 TABLET (5 MG TOTAL) BY MOUTH DAILY.   tamoxifen (NOLVADEX) 20 MG tablet TAKE 1 TABLET BY MOUTH EVERY DAY   TURMERIC PO Take 500 mg by mouth daily.    Facility-Administered Encounter Medications as of 03/17/2021  Medication   betamethasone acetate-betamethasone sodium phosphate (CELESTONE) injection 12 mg    Allergies (verified) Nitrofurantoin and Sulfamethoxazole-trimethoprim   History: Past Medical History:  Diagnosis Date   Anemia    Anxiety    Arthritis    knees   Atrophic vaginitis    Cancer ([facility])    breast cancer - left   Diverticulosis    Endometriosis    Family history of breast cancer    Family history of colon cancer    GERD (gastroesophageal reflux disease)    Headache(784.0)    Heart murmur    as a child only   History of kidney stones    passed stones, no surgery required   History of radiation therapy 01/30/16- 03/02/16   Left Breast 50 Gy in 25 fractions.    Hyperlipidemia    Hypertension    Hypothyroidism    Internal hemorrhoids    Osteopenia    Osteoporosis    Personal history of chemotherapy    Personal history of radiation therapy    Rheumatic fever    age 55   Sleep apnea    "  very mild" does not wear CPAP   Thyroid disease    Hypothyroid   Ulcer    UTI (lower urinary tract infection)    Past Surgical History:  Procedure Laterality Date   APPENDECTOMY     BREAST BIOPSY Left 08/19/2015   BREAST LUMPECTOMY Left 09/08/2015   BREAST LUMPECTOMY WITH RADIOACTIVE SEED AND SENTINEL LYMPH NODE BIOPSY Left 09/09/2015   Procedure: LEFT BREAST LUMPECTOMY WITH RADIOACTIVE SEED AND SENTINEL LYMPH NODE BIOPSY;  Surgeon: Jackolyn Confer, MD;  Location: [facility];  Service: General;  Laterality: Left;   COLONOSCOPY     DILATATION & CURETTAGE/HYSTEROSCOPY WITH MYOSURE N/A 10/09/2016   Procedure: [place];  Surgeon: Princess Bruins, MD;  Location: [facility];  Service:  Gynecology;  Laterality: N/A;  requesting 7:30am in our block  requests one hour   ESOPHAGOGASTRODUODENOSCOPY ENDOSCOPY     IR GENERIC HISTORICAL  12/05/2015   IR CV LINE INJECTION 12/05/2015 [name], MD [facility]   LAPAROSCOPIC ENDOMETRIOSIS FULGURATION  1978   PELVIC LAPAROSCOPY     PORTACATH PLACEMENT N/A 10/04/2015   Procedure: INSERTION PORT-A-CATH WITH ULTRASOUND;  Surgeon: Jackolyn Confer, MD;  Location: [facility];  Service: General;  Laterality: N/A;   portacath removal     TONSILLECTOMY     ULNAR NERVE REPAIR  2010   Family History  Problem Relation Age of Onset   Breast cancer Mother        Age 37   Hypertension Mother    Heart disease Father    Stroke Father    Colon cancer Father        dx 20s   Breast cancer Paternal Grandmother        Age 43   Diabetes Paternal Grandmother    Heart disease Maternal Grandmother    Social History   Socioeconomic History   Marital status: Married    Spouse name: Not on file   Number of children: 0   Years of education: Not on file   Highest education level: Not on file  Occupational History   Occupation: retired  Tobacco Use   Smoking status: Never   Smokeless tobacco: Never  Vaping Use   Vaping Use: Never used  Substance and Sexual Activity   Alcohol use: No   Drug use: No   Sexual activity: Never    Birth control/protection: Post-menopausal  Other Topics Concern   Not on file  Social History Narrative   Retired Oncologist.    Social Determinants of Health   Financial Resource Strain: Low Risk    Difficulty of Paying Living Expenses: Not hard at all  Food Insecurity: No Food Insecurity   Worried About Charity fundraiser in the Last Year: Never true   [place] in the Last Year: Never true  Transportation Needs: No Transportation Needs   Lack of Transportation (Medical): No   Lack of Transportation (Non-Medical): No  Physical Activity: Insufficiently Active   Days of Exercise per Week: 3  days   Minutes of Exercise per Session: 10 min  Stress: No Stress Concern Present   Feeling of Stress : Not at all  Social Connections: Socially Integrated   Frequency of Communication with Friends and Family: More than three times a week   Frequency of Social Gatherings with Friends and Family: More than three times a week   Attends Religious Services: More than 4 times per year   Active Member of [employer] or Organizations: Yes  Attends Archivist Meetings: More than 4 times per year   Marital Status: Married    Clinical Intake:  Pre-visit preparation completed: YesDiabetic? No  Interpreter Needed?: NoActivities of Daily Living In your present state of health, do you have any difficulty performing the following activities: 03/17/2021  Hearing? N  Vision? N  Comment Wears reading glasses.  Difficulty concentrating or making decisions? N  Walking or climbing stairs? N  Dressing or bathing? N  Doing errands, shopping? N  Preparing Food and eating ? N  Using the Toilet? N  In the past six months, have you accidently leaked urine? Y  Comment Wears breifs  Do you have problems with loss of bowel control? N  Managing your Medications? N  Managing your Finances? N  Housekeeping or managing your Housekeeping? N  Some recent data might be hidden    Patient Care Team: [name], MD as PCP - General [name], [name], MD as Consulting Physician (Oncology) Jackolyn Confer, MD as Consulting Physician (General Surgery) Eppie Gibson, MD as Attending Physician (Radiation Oncology) Richmond Campbell, MD as Consulting Physician (Gastroenterology) Lavonna Monarch, MD as Consulting Physician (Dermatology) Delice Bison, Charlestine Massed, NP as Nurse Practitioner (Hematology and Oncology) Viona Gilmore, [facility] as Pharmacist (Pharmacist)  Indicate any recent Medical Services you may have received from other than [facility] providers in the past year (date may be approximate).      Assessment:   This is a routine wellness examination for [place].  Hearing/Vision screen Hearing Screening - Comments:: No difficulty hearing Vision Screening - Comments:: Wears reading glasses. Followed by Dr Monica Martinez  Dietary issues and exercise activities discussed: Current Exercise Habits: Home exercise routine, Type of exercise: stretching;walking, Time (Minutes): 10, Frequency (Times/Week): 3, Weekly Exercise (Minutes/Week): 30, Intensity: Mild   Goals Addressed             This Visit's Progress    Exercise 3x per week (30 min per time)       Patient would like to return to walking. 3x per wk 30 mins per time.       Depression Screen PHQ 2/9 Scores 03/17/2021 03/08/2021 03/16/2020 02/24/2020 08/11/2019 02/10/2018 04/06/2016  PHQ - 2 Score 0 0 0 0 0 0 0  PHQ- 9 Score - - 0 - - - -    Fall Risk Fall Risk  03/17/2021 03/08/2021 03/16/2020 02/24/2020 08/11/2019  Falls in the past year? 1 1 1  0 1  Comment Patient triped and  fell without injury - - - -  Number falls in past yr: 0 0 1 - 0  Injury with Fall? 0 0 0 - 0  Risk for fall due to : - - History of fall(s) - History of fall(s)  Follow up - - Falls evaluation completed;Falls prevention discussed Falls evaluation completed Falls evaluation completed    FALL RISK PREVENTION PERTAINING TO THE HOME:  Any stairs in or around the home? Yes  If so, are there any without handrails? No  Home free of loose throw rugs in walkways, pet beds, electrical cords, etc? Yes  Adequate lighting in your home to reduce risk of falls? Yes   ASSISTIVE DEVICES UTILIZED TO PREVENT FALLS:  Life alert? Yes Use of a cane, walker or w/c? No  Grab bars in the bathroom? No  Shower chair or bench in shower? Yes  Elevated toilet seat or a handicapped toilet? No   TIMED UP AND GO:  Was the test performed? Yes .  Length of  time to ambulate 10 feet: 5 sec.   Gait steady and fast without use of assistive device  Cognitive Function:     6CIT  Screen 03/17/2021  What Year? 0 points  What month? 0 points  What time? 0 points  Count back from 20 0 points  Months in reverse 0 points  Repeat phrase 0 points  Total Score 0    Immunizations Immunization History  Administered Date(s) Administered   Fluad Quad(high Dose 65+) 02/15/2020, 03/17/2021   Influenza Split 12/26/2011   Influenza, High Dose Seasonal PF 02/09/2015, 02/04/2017, 02/10/2018   PFIZER(Purple Top)SARS-COV-2 Vaccination 05/24/2019, 06/17/2019, 01/14/2020   Pneumococcal Conjugate-13 08/03/2014   Pneumococcal Polysaccharide-23 11/21/2012   Tdap 02/09/2015   Zoster Recombinat (Shingrix) 07/07/2020   Qualifies for Shingles Vaccine?   Zostavax completed Yes   Shingrix Completed?: No.    Education has been provided regarding the importance of this vaccine. Patient has been advised to call insurance company to determine out of pocket expense if they have not yet received this vaccine. Advised may also receive vaccine at local pharmacy or Health Dept. Verbalized acceptance and understanding.  Screening Tests Health Maintenance  Topic Date Due   PAP SMEAR-Modifier  03/17/2021 (Originally 09/13/2018)   COVID-19 Vaccine (4 - Booster for Pfizer series) 04/02/2021 (Originally 03/10/2020)   Zoster Vaccines- Shingrix (2 of 2) 06/15/2021 (Originally 09/01/2020)   TETANUS/TDAP  02/08/2025   Pneumonia Vaccine 46+ Years old  Completed   INFLUENZA VACCINE  Completed   DEXA SCAN  Completed   HPV VACCINES  Aged Out    Health Maintenance  There are no preventive care reminders to display for this patient.   Vision Screening: Recommended annual ophthalmology exams for early detection of glaucoma and other disorders of the eye. Is the patient up to date with their annual eye exam?  Yes  Who is the provider or what is the name of the office in which the patient attends annual eye exams? Followed by Dr Monica Martinez    Dental Screening: Recommended annual dental exams for proper oral  hygiene  Community Resource Referral / Chronic Care Management:  CRR required this visit?  No   CCM required this visit?  No      Plan:     I have personally reviewed and noted the following in the patient's chart:   Medical and social history Use of alcohol, tobacco or illicit drugs  Current medications and supplements including opioid prescriptions.  Functional ability and status Nutritional status Physical activity Advanced directives List of other physicians Hospitalizations, surgeries, and ER visits in previous 12 months Vitals Screenings to include cognitive, depression, and falls Referrals and appointments  In addition, I have reviewed and discussed with patient certain preventive protocols, quality metrics, and best practice recommendations. A written personalized care plan for preventive services as well as general preventive health recommendations were provided to patient.     Criselda Peaches, LPN   [id number]

## 2021-03-17 NOTE — Patient Instructions (Addendum)
Ms. [name] , Thank you for taking time to come for your Medicare Wellness Visit. I appreciate your ongoing commitment to your health goals. Please review the following plan we discussed and let me know if I can assist you in the future.   These are the goals we discussed:  Goals      Exercise 3x per week (30 min per time)     Patient would like to return to walking. 3x per wk 30 mins per time.     Patient Stated     I would like to get rid of bursitis in left hip        This is a list of the screening recommended for you and due dates:  Health Maintenance  Topic Date Due   Pap Smear  03/17/2021*   COVID-19 Vaccine (4 - Booster for Pfizer series) 04/02/2021*   Zoster (Shingles) Vaccine (2 of 2) 06/15/2021*   Tetanus Vaccine  02/08/2025   Pneumonia Vaccine  Completed   Flu Shot  Completed   DEXA scan (bone density measurement)  Completed   HPV Vaccine  Aged Out  *Topic was postponed. The date shown is not the original due date.    Advanced directives: Yes Patient has Living Will penging  Power of Attorney  Conditions/risks identified: None  Next appointment: Follow up in one year for your annual wellness visit    Preventive Care 65 Years and Older, Female Preventive care refers to lifestyle choices and visits with your health care provider that can promote health and wellness. What does preventive care include? A yearly physical exam. This is also called an annual well check. Dental exams once or twice a year. Routine eye exams. Ask your health care provider how often you should have your eyes checked. Personal lifestyle choices, including: Daily care of your teeth and gums. Regular physical activity. Eating a healthy diet. Avoiding tobacco and drug use. Limiting alcohol use. Practicing safe sex. Taking low-dose aspirin every day. Taking vitamin and mineral supplements as recommended by your health care provider. What happens during an annual well check? The  services and screenings done by your health care provider during your annual well check will depend on your age, overall health, lifestyle risk factors, and family history of disease. Counseling  Your health care provider may ask you questions about your: Alcohol use. Tobacco use. Drug use. Emotional well-being. Home and relationship well-being. Sexual activity. Eating habits. History of falls. Memory and ability to understand (cognition). Work and work Statistician. Reproductive health. Screening  You may have the following tests or measurements: Height, weight, and BMI. Blood pressure. Lipid and cholesterol levels. These may be checked every 5 years, or more frequently if you are over 60 years old. Skin check. Lung cancer screening. You may have this screening every year starting at age 42 if you have a 30-pack-year history of smoking and currently smoke or have quit within the past 15 years. Fecal occult blood test (FOBT) of the stool. You may have this test every year starting at age 61. Flexible sigmoidoscopy or colonoscopy. You may have a sigmoidoscopy every 5 years or a colonoscopy every 10 years starting at age 33. Hepatitis C blood test. Hepatitis B blood test. Sexually transmitted disease (STD) testing. Diabetes screening. This is done by checking your blood sugar (glucose) after you have not eaten for a while (fasting). You may have this done every 1-3 years. Bone density scan. This is done to screen for osteoporosis. You may have  this done starting at age 67. Mammogram. This may be done every 1-2 years. Talk to your health care provider about how often you should have regular mammograms. Talk with your health care provider about your test results, treatment options, and if necessary, the need for more tests. Vaccines  Your health care provider may recommend certain vaccines, such as: Influenza vaccine. This is recommended every year. Tetanus, diphtheria, and acellular  pertussis (Tdap, Td) vaccine. You may need a Td booster every 10 years. Zoster vaccine. You may need this after age 8. Pneumococcal 13-valent conjugate (PCV13) vaccine. One dose is recommended after age 97. Pneumococcal polysaccharide (PPSV23) vaccine. One dose is recommended after age 46. Talk to your health care provider about which screenings and vaccines you need and how often you need them. This information is not intended to replace advice given to you by your health care provider. Make sure you discuss any questions you have with your health care provider. Document Released: 04/29/2015 Document Revised: 12/21/2015 Document Reviewed: 02/01/2015 Elsevier Interactive Patient Education  2017 [place] Prevention in the Home Falls can cause injuries. They can happen to people of all ages. There are many things you can do to make your home safe and to help prevent falls. What can I do on the outside of my home? Regularly fix the edges of walkways and driveways and fix any cracks. Remove anything that might make you trip as you walk through a door, such as a raised step or threshold. Trim any bushes or trees on the path to your home. Use bright outdoor lighting. Clear any walking paths of anything that might make someone trip, such as rocks or tools. Regularly check to see if handrails are loose or broken. Make sure that both sides of any steps have handrails. Any raised decks and porches should have guardrails on the edges. Have any leaves, snow, or ice cleared regularly. Use sand or salt on walking paths during winter. Clean up any spills in your garage right away. This includes oil or grease spills. What can I do in the bathroom? Use night lights. Install grab bars by the toilet and in the tub and shower. Do not use towel bars as grab bars. Use non-skid mats or decals in the tub or shower. If you need to sit down in the shower, use a plastic, non-slip stool. Keep the floor  dry. Clean up any water that spills on the floor as soon as it happens. Remove soap buildup in the tub or shower regularly. Attach bath mats securely with double-sided non-slip rug tape. Do not have throw rugs and other things on the floor that can make you trip. What can I do in the bedroom? Use night lights. Make sure that you have a light by your bed that is easy to reach. Do not use any sheets or blankets that are too big for your bed. They should not hang down onto the floor. Have a firm chair that has side arms. You can use this for support while you get dressed. Do not have throw rugs and other things on the floor that can make you trip. What can I do in the kitchen? Clean up any spills right away. Avoid walking on wet floors. Keep items that you use a lot in easy-to-reach places. If you need to reach something above you, use a strong step stool that has a grab bar. Keep electrical cords out of the way. Do not use floor polish or  wax that makes floors slippery. If you must use wax, use non-skid floor wax. Do not have throw rugs and other things on the floor that can make you trip. What can I do with my stairs? Do not leave any items on the stairs. Make sure that there are handrails on both sides of the stairs and use them. Fix handrails that are broken or loose. Make sure that handrails are as long as the stairways. Check any carpeting to make sure that it is firmly attached to the stairs. Fix any carpet that is loose or worn. Avoid having throw rugs at the top or bottom of the stairs. If you do have throw rugs, attach them to the floor with carpet tape. Make sure that you have a light switch at the top of the stairs and the bottom of the stairs. If you do not have them, ask someone to add them for you. What else can I do to help prevent falls? Wear shoes that: Do not have high heels. Have rubber bottoms. Are comfortable and fit you well. Are closed at the toe. Do not wear  sandals. If you use a stepladder: Make sure that it is fully opened. Do not climb a closed stepladder. Make sure that both sides of the stepladder are locked into place. Ask someone to hold it for you, if possible. Clearly mark and make sure that you can see: Any grab bars or handrails. First and last steps. Where the edge of each step is. Use tools that help you move around (mobility aids) if they are needed. These include: Canes. Walkers. Scooters. Crutches. Turn on the lights when you go into a dark area. Replace any light bulbs as soon as they burn out. Set up your furniture so you have a clear path. Avoid moving your furniture around. If any of your floors are uneven, fix them. If there are any pets around you, be aware of where they are. Review your medicines with your doctor. Some medicines can make you feel dizzy. This can increase your chance of falling. Ask your doctor what other things that you can do to help prevent falls. This information is not intended to replace advice given to you by your health care provider. Make sure you discuss any questions you have with your health care provider. Document Released: 01/27/2009 Document Revised: 09/08/2015 Document Reviewed: 05/07/2014 Elsevier Interactive Patient Education  2017 [employer].

## 2021-04-21 DIAGNOSIS — M109 Gout, unspecified: Secondary | ICD-10-CM | POA: Diagnosis not present

## 2021-04-28 ENCOUNTER — Telehealth: Payer: Self-pay | Admitting: Pharmacist

## 2021-04-28 NOTE — Chronic Care Management (AMB) (Signed)
° ° °  Chronic Care Management Pharmacy Assistant   Name: [name]  MRN: [id number] DOB: 08/19/1940  05/03/2021 APPOINTMENT REMINDER  [name] [place] was reminded to have all medications, supplements and any blood glucose and blood pressure readings available for review with [name], Pharm. D, at her telephone visit on 05/03/2021 at 3:00.   Questions: Have you had any recent office visit or specialist visit outside of [place]? Patient states she saw a doctor but doesn't recall who about gout in her knee.   Are there any concerns you would like to discuss during your office visit? Patient denies any concerns.   Are you having any problems obtaining your medications? (Whether it pharmacy issues or cost) Patient denies any issues getting medications.   If patient has any PAP medications ask if they are having any problems getting their PAP medication or refill? Patient denies any medications coming from PAP.  Care Gaps: AWV - scheduled for 03/19/2022 Last BP - 160/70 on 03/08/2021 [place] - overdue Covid vaccine - overdue  Star Rating Drug: Losartan 50mg  - last filled on [facility]/10/2021 [facility] Simvastatin 40mg  - last filled on [facility]/23/2021 [facility]  Any gaps in medications fill history? No  Gennie Alma [facility]  Catering manager [phone]

## 2021-05-03 ENCOUNTER — Telehealth: Payer: Medicare Other

## 2021-05-08 ENCOUNTER — Ambulatory Visit (INDEPENDENT_AMBULATORY_CARE_PROVIDER_SITE_OTHER): Payer: Medicare Other | Admitting: Family Medicine

## 2021-05-08 VITALS — BP 160/74 | HR 100 | Temp 98.1°F | Wt 163.9 lb

## 2021-05-08 DIAGNOSIS — M109 Gout, unspecified: Secondary | ICD-10-CM

## 2021-05-08 DIAGNOSIS — M25562 Pain in left knee: Secondary | ICD-10-CM | POA: Diagnosis not present

## 2021-05-08 DIAGNOSIS — I10 Essential (primary) hypertension: Secondary | ICD-10-CM | POA: Diagnosis not present

## 2021-05-08 LAB — URIC ACID: Uric Acid, Serum: 3 mg/dL (ref 2.4–7.0)

## 2021-05-08 MED ORDER — AMLODIPINE BESYLATE 5 MG PO TABS: 5.0000 mg | ORAL_TABLET | Freq: Every day | ORAL | 3 refills | Status: DC

## 2021-05-08 NOTE — Progress Notes (Signed)
Established Patient Office Visit  Subjective:  Patient ID: [name], female    DOB: 1940/04/19  Age: 81 y.o. MRN: [id number]  CC:  Chief Complaint  Patient presents with   Follow-up    HPI [name] presents for the following items  Left knee pain.  She has no degenerative changes.  She reportedly saw orthopedist couple weeks ago and [place].  She states her knee was tapped and that this was positive for "gout ".  We do not have any office notes or lab results at this time.  She was told to follow-up here to consider prophylaxis.  She denies any redness or warmth.  Currently not taking anything for the knee.  Ambulating without difficulty though with small limp.  Denies recent acute injury.  No recent uric acid.  Patient states her mother had gout.  Patient has hypertension.  She is on losartan and also takes metoprolol.  Not clear if she is taking medications regularly.  No recent headaches or dizziness.  No alcohol use.  Blood pressure was similarly up last visit as well    Past Medical History:  Diagnosis Date   Anemia    Anxiety    Arthritis    knees   Atrophic vaginitis    Cancer ([place])    breast cancer - left   Diverticulosis    Endometriosis    Family history of breast cancer    Family history of colon cancer    GERD (gastroesophageal reflux disease)    Headache(784.0)    Heart murmur    as a child only   History of kidney stones    passed stones, no surgery required   History of radiation therapy 01/30/16- 03/02/16   Left Breast 50 Gy in 25 fractions.    Hyperlipidemia    Hypertension    Hypothyroidism    Internal hemorrhoids    Osteopenia    Osteoporosis    Personal history of chemotherapy    Personal history of radiation therapy    Rheumatic fever    age 73   Sleep apnea    " very mild" does not wear CPAP   Thyroid disease    Hypothyroid   Ulcer    UTI (lower urinary tract infection)     Past Surgical History:   Procedure Laterality Date   APPENDECTOMY     BREAST BIOPSY Left 08/19/2015   BREAST LUMPECTOMY Left 09/08/2015   BREAST LUMPECTOMY WITH RADIOACTIVE SEED AND SENTINEL LYMPH NODE BIOPSY Left 09/09/2015   Procedure: LEFT BREAST LUMPECTOMY WITH RADIOACTIVE SEED AND SENTINEL LYMPH NODE BIOPSY;  Surgeon: Jackolyn Confer, MD;  Location: [facility];  Service: General;  Laterality: Left;   COLONOSCOPY     DILATATION & CURETTAGE/HYSTEROSCOPY WITH MYOSURE N/A 10/09/2016   Procedure: [place];  Surgeon: Princess Bruins, MD;  Location: [facility];  Service: Gynecology;  Laterality: N/A;  requesting 7:30am in our block  requests one hour   ESOPHAGOGASTRODUODENOSCOPY ENDOSCOPY     IR GENERIC HISTORICAL  12/05/2015   IR CV LINE INJECTION 12/05/2015 [name], MD [facility]   LAPAROSCOPIC ENDOMETRIOSIS FULGURATION  1978   PELVIC LAPAROSCOPY     PORTACATH PLACEMENT N/A 10/04/2015   Procedure: INSERTION PORT-A-CATH WITH ULTRASOUND;  Surgeon: Jackolyn Confer, MD;  Location: [facility];  Service: General;  Laterality: N/A;   portacath removal     TONSILLECTOMY     ULNAR NERVE REPAIR  2010    Family History  Problem  Relation Age of Onset   Breast cancer Mother        Age 24   Hypertension Mother    Heart disease Father    Stroke Father    Colon cancer Father        dx 22s   Breast cancer Paternal Grandmother        Age 97   Diabetes Paternal Grandmother    Heart disease Maternal Grandmother     Social History   Socioeconomic History   Marital status: Married    Spouse name: Not on file   Number of children: 0   Years of education: Not on file   Highest education level: Not on file  Occupational History   Occupation: retired  Tobacco Use   Smoking status: Never   Smokeless tobacco: Never  Vaping Use   Vaping Use: Never used  Substance and Sexual Activity   Alcohol use: No   Drug use: No   Sexual activity: Never    Birth control/protection:  Post-menopausal  Other Topics Concern   Not on file  Social History Narrative   Retired Oncologist.    Social Determinants of Health   Financial Resource Strain: Low Risk    Difficulty of Paying Living Expenses: Not hard at all  Food Insecurity: No Food Insecurity   Worried About Charity fundraiser in the Last Year: Never true   [place] in the Last Year: Never true  Transportation Needs: No Transportation Needs   Lack of Transportation (Medical): No   Lack of Transportation (Non-Medical): No  Physical Activity: Insufficiently Active   Days of Exercise per Week: 3 days   Minutes of Exercise per Session: 10 min  Stress: No Stress Concern Present   Feeling of Stress : Not at all  Social Connections: Socially Integrated   Frequency of Communication with Friends and Family: More than three times a week   Frequency of Social Gatherings with Friends and Family: More than three times a week   Attends Religious Services: More than 4 times per year   Active Member of [employer] or Organizations: Yes   Attends Music therapist: More than 4 times per year   Marital Status: Married  Human resources officer Violence: Not At Risk   Fear of Current or Ex-Partner: No   Emotionally Abused: No   Physically Abused: No   Sexually Abused: No    Outpatient Medications Prior to Visit  Medication Sig Dispense Refill   chlorhexidine (PERIDEX) 0.12 % solution Use as directed 15 mLs in the mouth or throat 2 (two) times daily.      furosemide (LASIX) 20 MG tablet TAKE 1 TABLET BY MOUTH EVERY DAY 90 tablet 1   HYDROcodone-acetaminophen (NORCO) 5-325 MG tablet Take 0.5 tablets by mouth every 4 (four) hours as needed for severe pain. 15 tablet 0   levothyroxine (SYNTHROID) 50 MCG tablet TAKE 1 TABLET (50 MCG TOTAL) BY MOUTH DAILY BEFORE BREAKFAST. 90 tablet 1   losartan (COZAAR) 50 MG tablet TAKE 1 TABLET BY MOUTH EVERY DAY 90 tablet 1   Melatonin 3 MG TBDP Take by mouth.     meloxicam  (MOBIC) 7.5 MG tablet Take 1 tablet daily for knee pain. 15 tablet 0   metoprolol tartrate (LOPRESSOR) 25 MG tablet TAKE 1 TABLET BY MOUTH TWICE A DAY 180 tablet 1   Omeprazole-Sodium Bicarbonate (ZEGERID) 20-1100 MG CAPS capsule Take 1 capsule by mouth daily before breakfast.     simvastatin (  ZOCOR) 40 MG tablet TAKE 1 TABLET BY MOUTH AT BEDTIME 90 tablet 1   solifenacin (VESICARE) 5 MG tablet TAKE 1 TABLET (5 MG TOTAL) BY MOUTH DAILY. 90 tablet 1   tamoxifen (NOLVADEX) 20 MG tablet TAKE 1 TABLET BY MOUTH EVERY DAY 30 tablet 32   TURMERIC PO Take 500 mg by mouth daily.      Facility-Administered Medications Prior to Visit  Medication Dose Route Frequency Provider Last Rate Last Admin   betamethasone acetate-betamethasone sodium phosphate (CELESTONE) injection 12 mg  12 mg Other Once Magnus Sinning, MD        Allergies  Allergen Reactions   Nitrofurantoin Nausea And Vomiting    GI upset   Sulfamethoxazole-Trimethoprim Nausea And Vomiting and Other (See Comments)    GI upset    ROS Review of Systems  Constitutional:  Negative for chills, fatigue and fever.  Eyes:  Negative for visual disturbance.  Respiratory:  Negative for cough, chest tightness, shortness of breath and wheezing.   Cardiovascular:  Negative for chest pain, palpitations and leg swelling.  Musculoskeletal:  Positive for arthralgias.  Neurological:  Negative for dizziness, seizures, syncope, weakness, light-headedness and headaches.     Objective:    Physical Exam Vitals reviewed.  Constitutional:      Appearance: Normal appearance.  Cardiovascular:     Rate and Rhythm: Normal rate and regular rhythm.  Pulmonary:     Effort: Pulmonary effort is normal.     Breath sounds: Normal breath sounds.  Musculoskeletal:     Comments: Left knee reveals no warmth.  No erythema.  Mild medial and lateral joint line tenderness.  No significant effusion.  Does have some crepitus with flexion and extension.  Neurological:      Mental Status: She is alert.    BP (!) 160/74 (BP Location: Left Arm, Patient Position: Sitting, Cuff Size: Normal)    Pulse 100    Temp 98.1 F (36.7 C) (Oral)    Wt 163 lb 14.4 oz (74.3 kg)    SpO2 97%    BMI 32.01 kg/m  Wt Readings from Last 3 Encounters:  05/08/21 163 lb 14.4 oz (74.3 kg)  03/17/21 168 lb 14.4 oz (76.6 kg)  03/08/21 169 lb (76.7 kg)     Health Maintenance Due  Topic Date Due   PAP SMEAR-Modifier  09/13/2018   COVID-19 Vaccine (4 - Booster for Pfizer series) 03/10/2020    There are no preventive care reminders to display for this patient.  Lab Results  Component Value Date   TSH 1.48 08/01/2020   Lab Results  Component Value Date   WBC 4.9 03/26/2018   HGB 11.6 (L) 03/26/2018   HCT 37.6 03/26/2018   MCV 85.5 03/26/2018   PLT 214 03/26/2018   Lab Results  Component Value Date   NA 143 09/16/2020   K 4.0 09/16/2020   CHLORIDE 103 01/03/2017   CO2 25 09/16/2020   GLUCOSE 92 09/16/2020   BUN 22 09/16/2020   CREATININE 0.92 (H) 09/16/2020   BILITOT 0.4 09/16/2020   ALKPHOS 62 08/01/2020   AST 29 09/16/2020   ALT 22 09/16/2020   PROT 6.6 09/16/2020   ALBUMIN 4.0 08/01/2020   CALCIUM 9.2 09/16/2020   ANIONGAP 11 03/26/2018   EGFR 46 (L) 01/03/2017   GFR 57.41 (L) 08/01/2020   Lab Results  Component Value Date   CHOL 154 08/01/2020   Lab Results  Component Value Date   HDL 43.90 08/01/2020   No results found  for: [facility] Lab Results  Component Value Date   TRIG 324.0 (H) 08/01/2020   Lab Results  Component Value Date   CHOLHDL 4 08/01/2020   Lab Results  Component Value Date   HGBA1C 5.8 11/15/2014      Assessment & Plan:   #1 hypertension poorly controlled.  This makes couple of visits back to back where her systolic reading has been elevated.  -Add amlodipine 5 mg daily and continue losartan.  We could titrate her losartan down the road if necessary.  Would avoid thiazides with question of gout as below.  Reassess in 2  weeks  #2 acute left knee pain.  Intermittent.  Reported history of gout from recent tap.  We have no records.  Will check uric acid today and send for old records from orthopedist from visit couple weeks ago.  If gout confirmed consider starting allopurinol low dosage and build up slowly  -2-week follow-up.  If gout confirmed we will discuss dietary measures further at that time.  We would also consider low-dose colchicine 0.6 mg once daily during allopurinol buildup  #3 history of hypothyroidism.  Due for follow-up TSH soon.  Last TSH was in April of last year   Meds ordered this encounter  Medications   amLODipine (NORVASC) 5 MG tablet    Sig: Take 1 tablet (5 mg total) by mouth daily.    Dispense:  90 tablet    Refill:  3    Follow-up: Return in about 2 weeks (around 05/22/2021).    [name], MD

## 2021-05-08 NOTE — Patient Instructions (Signed)
Go to the lab to check uric acid  Start the Amlodipine one daily for blood pressure   Set up 2 week follow up.

## 2021-05-11 DIAGNOSIS — M25561 Pain in right knee: Secondary | ICD-10-CM | POA: Diagnosis not present

## 2021-05-11 DIAGNOSIS — M25562 Pain in left knee: Secondary | ICD-10-CM | POA: Diagnosis not present

## 2021-05-18 DIAGNOSIS — M170 Bilateral primary osteoarthritis of knee: Secondary | ICD-10-CM | POA: Diagnosis not present

## 2021-06-01 ENCOUNTER — Other Ambulatory Visit: Payer: Self-pay | Admitting: Family Medicine

## 2021-06-13 DIAGNOSIS — M1712 Unilateral primary osteoarthritis, left knee: Secondary | ICD-10-CM | POA: Diagnosis not present

## 2021-06-22 DIAGNOSIS — M1712 Unilateral primary osteoarthritis, left knee: Secondary | ICD-10-CM | POA: Diagnosis not present

## 2021-06-26 ENCOUNTER — Ambulatory Visit
Admission: RE | Admit: 2021-06-26 | Discharge: 2021-06-26 | Disposition: A | Discharge status: Home / Self Care | Payer: Medicare Other | Source: Ambulatory Visit | Attending: Family Medicine | Admitting: Family Medicine

## 2021-06-26 ENCOUNTER — Other Ambulatory Visit: Payer: Self-pay

## 2021-06-26 DIAGNOSIS — M85851 Other specified disorders of bone density and structure, right thigh: Secondary | ICD-10-CM | POA: Diagnosis not present

## 2021-06-26 DIAGNOSIS — Z780 Asymptomatic menopausal state: Secondary | ICD-10-CM

## 2021-06-28 ENCOUNTER — Ambulatory Visit (INDEPENDENT_AMBULATORY_CARE_PROVIDER_SITE_OTHER): Payer: Medicare Other | Admitting: Family Medicine

## 2021-06-28 ENCOUNTER — Encounter: Payer: Self-pay | Admitting: Family Medicine

## 2021-06-28 VITALS — BP 178/68 | HR 94 | Temp 97.5°F | Ht 60.0 in | Wt 161.0 lb

## 2021-06-28 DIAGNOSIS — M8580 Other specified disorders of bone density and structure, unspecified site: Secondary | ICD-10-CM | POA: Diagnosis not present

## 2021-06-28 DIAGNOSIS — I10 Essential (primary) hypertension: Secondary | ICD-10-CM

## 2021-06-28 MED ORDER — LOSARTAN POTASSIUM-HCTZ 100-12.5 MG PO TABS: 1.0000 | ORAL_TABLET | Freq: Every day | ORAL | 3 refills | Status: DC

## 2021-06-28 NOTE — Progress Notes (Signed)
Established Patient Office Visit  Subjective:  Patient ID: [name], female    DOB: 11/21/1940  Age: 81 y.o. MRN: [id number]  CC:  Chief Complaint  Patient presents with   Results    HPI [name] presents for follow-up to discuss recent DEXA scan and for other issues as below.  She has history of known osteopenia.  She had DEXA scan back in 2017 which showed osteopenia.  At that time, she had hip fracture 10-year risk of 3.7% with any major osteoporotic fracture risk of less than 14%.  She denies any recent falls.  She has been limited in exercise recently because of left knee issues.  She is seeing orthopedist and scheduled for third gel injection soon.  She does not take any calcium or vitamin D consistently.  Recent DEXA scan results reviewed with patient.  Her lumbar spine was excluded because of degenerative changes.  Surprisingly, she had very little change in her DEXA scan since 2017.  She had T score -1.8 right femur.  Radius was -1.0 which is unchanged from 2017.  However, her FRAX score for major osteoporotic fracture was 14.4% and for hip fracture 4.0%.  She has hypertension.  She is supposed to be on amlodipine 5 mg daily which we added back in January, losartan 50 mg daily, and metoprolol 25 mg twice daily.  They have had multiple home readings over 140 and several over 150 recently.  No headaches.  Tendency toward mild ankle edema occasionally.  No alcohol use.  Past Medical History:  Diagnosis Date   Anemia    Anxiety    Arthritis    knees   Atrophic vaginitis    Cancer (HCC)    breast cancer - left   Diverticulosis    Endometriosis    Family history of breast cancer    Family history of colon cancer    GERD (gastroesophageal reflux disease)    Headache(784.0)    Heart murmur    as a child only   History of kidney stones    passed stones, no surgery required   History of radiation therapy 01/30/16- 03/02/16   Left Breast 50 Gy in 25  fractions.    Hyperlipidemia    Hypertension    Hypothyroidism    Internal hemorrhoids    Osteopenia    Osteoporosis    Personal history of chemotherapy    Personal history of radiation therapy    Rheumatic fever    age 14   Sleep apnea    " very mild" does not wear CPAP   Thyroid disease    Hypothyroid   Ulcer    UTI (lower urinary tract infection)     Past Surgical History:  Procedure Laterality Date   APPENDECTOMY     BREAST BIOPSY Left 08/19/2015   BREAST LUMPECTOMY Left 09/08/2015   BREAST LUMPECTOMY WITH RADIOACTIVE SEED AND SENTINEL LYMPH NODE BIOPSY Left 09/09/2015   Procedure: LEFT BREAST LUMPECTOMY WITH RADIOACTIVE SEED AND SENTINEL LYMPH NODE BIOPSY;  Surgeon: Avel Peace, MD;  Location: [facility] OR;  Service: General;  Laterality: Left;   COLONOSCOPY     DILATATION & CURETTAGE/HYSTEROSCOPY WITH MYOSURE N/A 10/09/2016   Procedure: DILATATION & CURETTAGE/HYSTEROSCOPY WITH MYOSURE;  Surgeon: Genia Del, MD;  Location: [facility];  Service: Gynecology;  Laterality: N/A;  requesting 7:30am in our block  requests one hour   ESOPHAGOGASTRODUODENOSCOPY ENDOSCOPY     IR GENERIC HISTORICAL  12/05/2015   IR CV LINE INJECTION 12/05/2015  Jolaine Click, MD [facility]   LAPAROSCOPIC ENDOMETRIOSIS FULGURATION  1978   PELVIC LAPAROSCOPY     PORTACATH PLACEMENT N/A 10/04/2015   Procedure: INSERTION PORT-A-CATH WITH ULTRASOUND;  Surgeon: Avel Peace, MD;  Location: [facility] OR;  Service: General;  Laterality: N/A;   portacath removal     TONSILLECTOMY     ULNAR NERVE REPAIR  2010    Family History  Problem Relation Age of Onset   Breast cancer Mother        Age 100   Hypertension Mother    Heart disease Father    Stroke Father    Colon cancer Father        dx 82s   Breast cancer Paternal Grandmother        Age 39   Diabetes Paternal Grandmother    Heart disease Maternal Grandmother     Social History   Socioeconomic History   Marital status: Married    Spouse name:  Not on file   Number of children: 0   Years of education: Not on file   Highest education level: Not on file  Occupational History   Occupation: retired  Tobacco Use   Smoking status: Never   Smokeless tobacco: Never  Vaping Use   Vaping Use: Never used  Substance and Sexual Activity   Alcohol use: No   Drug use: No   Sexual activity: Never    Birth control/protection: Post-menopausal  Other Topics Concern   Not on file  Social History Narrative   Retired Midwife.    Social Determinants of Health   Financial Resource Strain: Low Risk    Difficulty of Paying Living Expenses: Not hard at all  Food Insecurity: No Food Insecurity   Worried About Programme researcher, broadcasting/film/video in the Last Year: Never true   Ran Out of Food in the Last Year: Never true  Transportation Needs: No Transportation Needs   Lack of Transportation (Medical): No   Lack of Transportation (Non-Medical): No  Physical Activity: Insufficiently Active   Days of Exercise per Week: 3 days   Minutes of Exercise per Session: 10 min  Stress: No Stress Concern Present   Feeling of Stress : Not at all  Social Connections: Socially Integrated   Frequency of Communication with Friends and Family: More than three times a week   Frequency of Social Gatherings with Friends and Family: More than three times a week   Attends Religious Services: More than 4 times per year   Active Member of [employer] or Organizations: Yes   Attends Engineer, structural: More than 4 times per year   Marital Status: Married  Catering manager Violence: Not At Risk   Fear of Current or Ex-Partner: No   Emotionally Abused: No   Physically Abused: No   Sexually Abused: No    Outpatient Medications Prior to Visit  Medication Sig Dispense Refill   amLODipine (NORVASC) 5 MG tablet Take 1 tablet (5 mg total) by mouth daily. 90 tablet 3   chlorhexidine (PERIDEX) 0.12 % solution Use as directed 15 mLs in the mouth or throat 2 (two) times  daily.      furosemide (LASIX) 20 MG tablet TAKE 1 TABLET BY MOUTH EVERY DAY 90 tablet 1   HYDROcodone-acetaminophen (NORCO) 5-325 MG tablet Take 0.5 tablets by mouth every 4 (four) hours as needed for severe pain. 15 tablet 0   levothyroxine (SYNTHROID) 50 MCG tablet TAKE 1 TABLET BY MOUTH DAILY BEFORE BREAKFAST 90 tablet  1   Melatonin 3 MG TBDP Take by mouth.     meloxicam (MOBIC) 7.5 MG tablet Take 1 tablet daily for knee pain. 15 tablet 0   metoprolol tartrate (LOPRESSOR) 25 MG tablet TAKE 1 TABLET BY MOUTH TWICE A DAY 180 tablet 1   Omeprazole-Sodium Bicarbonate (ZEGERID) 20-1100 MG CAPS capsule Take 1 capsule by mouth daily before breakfast.     simvastatin (ZOCOR) 40 MG tablet TAKE 1 TABLET BY MOUTH AT BEDTIME 90 tablet 1   solifenacin (VESICARE) 5 MG tablet TAKE 1 TABLET (5 MG TOTAL) BY MOUTH DAILY. 90 tablet 1   tamoxifen (NOLVADEX) 20 MG tablet TAKE 1 TABLET BY MOUTH EVERY DAY 30 tablet 32   TURMERIC PO Take 500 mg by mouth daily.      losartan (COZAAR) 50 MG tablet TAKE 1 TABLET BY MOUTH EVERY DAY 90 tablet 1   Facility-Administered Medications Prior to Visit  Medication Dose Route Frequency Provider Last Rate Last Admin   betamethasone acetate-betamethasone sodium phosphate (CELESTONE) injection 12 mg  12 mg Other Once Tyrell Antonio, MD        Allergies  Allergen Reactions   Nitrofurantoin Nausea And Vomiting    GI upset   Sulfamethoxazole-Trimethoprim Nausea And Vomiting and Other (See Comments)    GI upset    ROS Review of Systems  Constitutional:  Negative for fatigue and unexpected weight change.  Eyes:  Negative for visual disturbance.  Respiratory:  Negative for cough, chest tightness, shortness of breath and wheezing.   Cardiovascular:  Negative for chest pain and palpitations.  Musculoskeletal:  Negative for back pain.  Neurological:  Negative for dizziness, seizures, syncope, weakness, light-headedness and headaches.     Objective:    Physical  Exam Vitals reviewed.  Constitutional:      Appearance: Normal appearance.  Cardiovascular:     Rate and Rhythm: Normal rate and regular rhythm.  Pulmonary:     Effort: Pulmonary effort is normal.     Breath sounds: Normal breath sounds. No wheezing or rales.  Musculoskeletal:     Comments: No pitting edema  Neurological:     Mental Status: She is alert.    BP (!) 164/66 (BP Location: Left Arm, Patient Position: Sitting, Cuff Size: Normal)   Pulse 94   Temp (!) 97.5 F (36.4 C) (Oral)   Ht 5' (1.524 m)   Wt 161 lb (73 kg)   SpO2 97%   BMI 31.44 kg/m  Wt Readings from Last 3 Encounters:  06/28/21 161 lb (73 kg)  05/08/21 163 lb 14.4 oz (74.3 kg)  03/17/21 168 lb 14.4 oz (76.6 kg)     Health Maintenance Due  Topic Date Due   PAP SMEAR-Modifier  09/13/2018   COVID-19 Vaccine (4 - Booster for Pfizer series) 03/10/2020   Zoster Vaccines- Shingrix (2 of 2) 09/01/2020    There are no preventive care reminders to display for this patient.  Lab Results  Component Value Date   TSH 1.48 08/01/2020   Lab Results  Component Value Date   WBC 4.9 03/26/2018   HGB 11.6 (L) 03/26/2018   HCT 37.6 03/26/2018   MCV 85.5 03/26/2018   PLT 214 03/26/2018   Lab Results  Component Value Date   NA 143 09/16/2020   K 4.0 09/16/2020   CHLORIDE 103 01/03/2017   CO2 25 09/16/2020   GLUCOSE 92 09/16/2020   BUN 22 09/16/2020   CREATININE 0.92 (H) 09/16/2020   BILITOT 0.4 09/16/2020   ALKPHOS 62  08/01/2020   AST 29 09/16/2020   ALT 22 09/16/2020   PROT 6.6 09/16/2020   ALBUMIN 4.0 08/01/2020   CALCIUM 9.2 09/16/2020   ANIONGAP 11 03/26/2018   EGFR 46 (L) 01/03/2017   GFR 57.41 (L) 08/01/2020   Lab Results  Component Value Date   CHOL 154 08/01/2020   Lab Results  Component Value Date   HDL 43.90 08/01/2020   No results found for: [facility] Lab Results  Component Value Date   TRIG 324.0 (H) 08/01/2020   Lab Results  Component Value Date   CHOLHDL 4 08/01/2020    Lab Results  Component Value Date   HGBA1C 5.8 11/15/2014      Assessment & Plan:   #1 osteopenia with FRAX score of 4.0% for hip fracture and 14.4% for major osteoporotic fracture.  In comparing results to 2017 she has had very little change in her scores.  We did discuss NOF guidelines regarding treatment.  At this point she is equivocal.  Encouraging is the fact that she had little change over 6 years  -Continue regular weightbearing exercise as tolerated -Recommend daily calcium 1200 mg and vitamin D 1000 international units. -Consider repeat DEXA in 2 years  #2 hypertension poorly controlled.  Repeat right arm seated after rest 178/68  -Continue amlodipine 5 mg daily -Change plain losartan 50 mg daily to losartan/HCTZ 100/12.5 mg -Keep daily sodium intake less than 2500 mg -Set up 1 month follow-up.  Plan to get labs then including basic metabolic panel -It sounds like she is probably on low-dose meloxicam per orthopedics.  This could be having some impact on adversely raising her blood pressure  #3 hypothyroidism.  Plan to check TSH at follow-up.  She will also need lipids, chemistries, hepatic panel at that time  Meds ordered this encounter  Medications   losartan-hydrochlorothiazide (HYZAAR) 100-12.5 MG tablet    Sig: Take 1 tablet by mouth daily.    Dispense:  90 tablet    Refill:  3    Follow-up: Return in about 5 weeks (around 08/02/2021).    Evelena Peat, MD

## 2021-06-28 NOTE — Patient Instructions (Addendum)
Recommend daily calcium 1,200 mg and vit D 1,000 IU ? ?STOP the plain Losartan ? ?START Losartan HCTZ one daily ? ?Continue the Amlodipine and Metoprolol.   ?

## 2021-06-29 ENCOUNTER — Telehealth: Payer: Self-pay | Admitting: Pharmacist

## 2021-06-29 DIAGNOSIS — M170 Bilateral primary osteoarthritis of knee: Secondary | ICD-10-CM | POA: Diagnosis not present

## 2021-06-29 NOTE — Chronic Care Management (AMB) (Signed)
? ? ?Chronic Care Management ?Pharmacy Assistant  ? ?Name: [name]  MRN: [id number] DOB: 05/07/1940 ? ?Reason for Encounter: Disease State / Hypertension Assessment Call ?  ?Conditions to be addressed/monitored: ?HTN ? ?Recent office visits:  ?06/28/2021 [name] MD - Patient was seen for osteopenia and additional issues. Started Losartan/HCTZ 100/12.5 daily.  Discontinued Losartan 50 mg. Follow up in 5 weeks  ? ?05/08/2021 [name] MD - Patient was seen for gout and additional issues. Started Amlodipine 5 mg daily. Follow up in 2 weeks.  ? ?Recent consult visits:  ?None ? ?Hospital visits:  ?None ? ?Medications: ?Outpatient Encounter Medications as of 06/29/2021  ?Medication Sig  ? amLODipine (NORVASC) 5 MG tablet Take 1 tablet (5 mg total) by mouth daily.  ? chlorhexidine (PERIDEX) 0.12 % solution Use as directed 15 mLs in the mouth or throat 2 (two) times daily.   ? furosemide (LASIX) 20 MG tablet TAKE 1 TABLET BY MOUTH EVERY DAY  ? HYDROcodone-acetaminophen (NORCO) 5-325 MG tablet Take 0.5 tablets by mouth every 4 (four) hours as needed for severe pain.  ? levothyroxine (SYNTHROID) 50 MCG tablet TAKE 1 TABLET BY MOUTH DAILY BEFORE BREAKFAST  ? losartan-hydrochlorothiazide (HYZAAR) 100-12.5 MG tablet Take 1 tablet by mouth daily.  ? Melatonin 3 MG TBDP Take by mouth.  ? meloxicam (MOBIC) 7.5 MG tablet Take 1 tablet daily for knee pain.  ? metoprolol tartrate (LOPRESSOR) 25 MG tablet TAKE 1 TABLET BY MOUTH TWICE A DAY  ? Omeprazole-Sodium Bicarbonate (ZEGERID) 20-1100 MG CAPS capsule Take 1 capsule by mouth daily before breakfast.  ? simvastatin (ZOCOR) 40 MG tablet TAKE 1 TABLET BY MOUTH AT BEDTIME  ? solifenacin (VESICARE) 5 MG tablet TAKE 1 TABLET (5 MG TOTAL) BY MOUTH DAILY.  ? tamoxifen (NOLVADEX) 20 MG tablet TAKE 1 TABLET BY MOUTH EVERY DAY  ? TURMERIC PO Take 500 mg by mouth daily.   ? ?Facility-Administered Encounter Medications as of 06/29/2021  ?Medication  ? betamethasone  acetate-betamethasone sodium phosphate (CELESTONE) injection 12 mg  ?Fill History: ?FUROSEMIDE 20 MG TABLET 04/20/2021 90  ? ?LEVOTHYROXINE 50 MCG TABLET 03/05/2021 90  ? ?LOSARTAN POTASSIUM/HYDROCHLOROTHIAZ IDE 100-12.5 MG TABS 06/28/2021 90  ? ?SIMVASTATIN 40 MG TABLET 02/23/2021 90  ? 04/07/2021 90  ? ?SOLIFENACIN 5 MG TABLET 05/07/2021 90  ? ?TAMOXIFEN 20 MG TABLET 03/29/2020 90  ? ?AMLODIPINE BESYLATE 5 MG TAB 05/08/2021 90  ? ? ?Reviewed chart prior to disease state call. Spoke with patient regarding BP ? ?Recent Office Vitals: ?BP Readings from Last 3 Encounters:  ?06/28/21 (!) 178/68  ?05/08/21 (!) 160/74  ?03/17/21 138/60  ? ?Pulse Readings from Last 3 Encounters:  ?06/28/21 94  ?05/08/21 100  ?03/17/21 74  ?  ?Wt Readings from Last 3 Encounters:  ?06/28/21 161 lb (73 kg)  ?05/08/21 163 lb 14.4 oz (74.3 kg)  ?03/17/21 168 lb 14.4 oz (76.6 kg)  ?  ? ?Kidney Function ?Lab Results  ?Component Value Date/Time  ? CREATININE 0.92 (H) 09/16/2020 02:47 PM  ? CREATININE 0.94 08/01/2020 01:48 PM  ? CREATININE 1.05 08/11/2019 03:18 PM  ? CREATININE 0.83 03/26/2018 02:42 PM  ? CREATININE 1.1 01/03/2017 10:52 AM  ? CREATININE 1.2 (H) 07/12/2016 10:58 AM  ? GFR 57.41 (L) 08/01/2020 01:48 PM  ? GFRNONAA >60 03/26/2018 02:42 PM  ? GFRAA >60 03/26/2018 02:42 PM  ? ? ?BMP Latest Ref Rng & Units 09/16/2020 08/01/2020 08/11/2019  ?Glucose 65 - 99 mg/dL 92 133(H) 100(H)  ?BUN 7 - 25  mg/dL 22 21 34(H)  ?Creatinine 0.60 - 0.88 mg/dL 0.92(H) 0.94 1.05  ?BUN/Creat Ratio 6 - 22 (calc) 24(H) - -  ?Sodium 135 - 146 mmol/L 143 143 142  ?Potassium 3.5 - 5.3 mmol/L 4.0 3.7 4.2  ?Chloride 98 - 110 mmol/L 108 106 102  ?CO2 20 - 32 mmol/L '25 27 31  '$ ?Calcium 8.6 - 10.4 mg/dL 9.2 9.4 9.4  ? ? ?Current antihypertensive regimen:  ?Amlodipine 5 mg daily ?Hyzaar 100/12.5 daily ?Metoprolol 25 mg twice daily ? ?How often are you checking your Blood Pressure? Patient is not checking regular, it has been a while.  Patient plans to start checking weekly  this week and keeping a log.  ? ?Current home BP readings: Patient has not been keeping a log. ? ?What recent interventions/DTPs have been made by any provider to improve Blood Pressure control since last CPP Visit: No recent interventions ? ?Any recent hospitalizations or ED visits since last visit with CPP? No recent hospital visits. ? ?What diet changes have been made to improve Blood Pressure Control?  ?Patient eats what she feels like  ?Breakfast - patient will have hard boiled eggs or cheerios ?Lunch - patient will have sandwich with deli meat and soup ?Dinner - patient will have a meal containing a meat and vegetable.  ? ?What exercise is being done to improve your Blood Pressure Control?  ?Patient is unable to walk at this time due to left knee pain. ? ?Adherence Review: ?Is the patient currently on ACE/ARB medication? Yes ?Does the patient have >5 day gap between last estimated fill dates? No ? ?Care Gaps: ?AWV - scheduled for 03/19/2022 ?Last BP - 178/68 on 06/28/2021 ?Papsmear - overdue ?Covid vaccine - overdue ?Shingrix - over due ?  ?Star Rating Drug: ?Losartan HCTZ 100/12.5 - [facility] ?Simvastatin '40mg'$  - last filled [facility] verified with pharmacist ? ?Gennie Alma CMA  ?Clinical Pharmacist Assistant ?[phone] ? ?

## 2021-07-13 ENCOUNTER — Telehealth: Payer: Self-pay | Admitting: Pharmacist

## 2021-07-13 NOTE — Progress Notes (Signed)
error 

## 2021-08-02 ENCOUNTER — Ambulatory Visit (INDEPENDENT_AMBULATORY_CARE_PROVIDER_SITE_OTHER): Payer: Medicare Other | Admitting: Family Medicine

## 2021-08-02 ENCOUNTER — Encounter: Payer: Self-pay | Admitting: Family Medicine

## 2021-08-02 VITALS — BP 126/60 | HR 81 | Temp 97.7°F | Ht 60.0 in | Wt 159.1 lb

## 2021-08-02 DIAGNOSIS — E038 Other specified hypothyroidism: Secondary | ICD-10-CM | POA: Diagnosis not present

## 2021-08-02 DIAGNOSIS — E785 Hyperlipidemia, unspecified: Secondary | ICD-10-CM

## 2021-08-02 DIAGNOSIS — I10 Essential (primary) hypertension: Secondary | ICD-10-CM

## 2021-08-02 NOTE — Patient Instructions (Signed)
BP much improved!    ? ?Continue current medications ? ?Let's plan on 6 month follow up.   ?

## 2021-08-02 NOTE — Progress Notes (Signed)
?Subjective:  ?  ? Patient ID: [name], female   DOB: [birth year], 81 y.o.   MRN: [id number] ? ?HPI ?Naylah is here for follow-up regarding recent severely elevated blood pressure.  Refer to prior note for details.  Her blood pressure last visit was 178/68.  We switched her from plain losartan 50 mg to losartan/HCTZ 100/12.5 mg and continued her amlodipine 5 mg daily and had her restrict nonsteroidal use.  Blood pressure much improved today.  She brings in log of home readings and has had some readings 737T systolic but mostly 062I to 130s.  She also takes low-dose metoprolol 25 mg twice daily. ? ?Hyperlipidemia treated with simvastatin.  Due for follow-up labs.  She has hypothyroidism treated with levothyroxine 50 mcg daily and needs follow-up.  She does take her levothyroxine early morning prior to other medications.  She has Zegerid on her med list but states she has not taken that for some time.  Denies any recent falls. ? ?She has had significant knee arthritis issues which have limited mobility.  She has seen orthopedics for that. ? ?Past Medical History:  ?Diagnosis Date  ? Anemia   ? Anxiety   ? Arthritis   ? knees  ? Atrophic vaginitis   ? Cancer [facility])   ? breast cancer - left  ? Diverticulosis   ? Endometriosis   ? Family history of breast cancer   ? Family history of colon cancer   ? GERD (gastroesophageal reflux disease)   ? Headache(784.0)   ? Heart murmur   ? as a child only  ? History of kidney stones   ? passed stones, no surgery required  ? History of radiation therapy 01/30/16- 03/02/16  ? Left Breast 50 Gy in 25 fractions.   ? Hyperlipidemia   ? Hypertension   ? Hypothyroidism   ? Internal hemorrhoids   ? Osteopenia   ? Osteoporosis   ? Personal history of chemotherapy   ? Personal history of radiation therapy   ? Rheumatic fever   ? age 67  ? Sleep apnea   ? " very mild" does not wear CPAP  ? Thyroid disease   ? Hypothyroid  ? Ulcer   ? UTI (lower urinary tract infection)   ? ?Past  Surgical History:  ?Procedure Laterality Date  ? APPENDECTOMY    ? BREAST BIOPSY Left 08/19/2015  ? BREAST LUMPECTOMY Left 09/08/2015  ? BREAST LUMPECTOMY WITH RADIOACTIVE SEED AND SENTINEL LYMPH NODE BIOPSY Left 09/09/2015  ? Procedure: LEFT BREAST LUMPECTOMY WITH RADIOACTIVE SEED AND SENTINEL LYMPH NODE BIOPSY;  Surgeon: Jackolyn Confer, MD;  Location: [place];  Service: General;  Laterality: Left;  ? COLONOSCOPY    ? DILATATION & CURETTAGE/HYSTEROSCOPY WITH MYOSURE N/A 10/09/2016  ? Procedure: [place];  Surgeon: Princess Bruins, MD;  Location: [place] [facility];  Service: Gynecology;  Laterality: N/A;  requesting 7:30am in our block ? ?requests one hour  ? ESOPHAGOGASTRODUODENOSCOPY ENDOSCOPY    ? IR GENERIC HISTORICAL  12/05/2015  ? IR CV LINE INJECTION 12/05/2015 [name], MD [facility]  ? LAPAROSCOPIC ENDOMETRIOSIS FULGURATION  1978  ? PELVIC LAPAROSCOPY    ? PORTACATH PLACEMENT N/A 10/04/2015  ? Procedure: INSERTION PORT-A-CATH WITH ULTRASOUND;  Surgeon: Jackolyn Confer, MD;  Location: [place];  Service: General;  Laterality: N/A;  ? portacath removal    ? TONSILLECTOMY    ? ULNAR NERVE REPAIR  2010  ? ? reports that she has never smoked. She has never  used smokeless tobacco. She reports that she does not drink alcohol and does not use drugs. ?family history includes Breast cancer in her mother and paternal grandmother; Colon cancer in her father; Diabetes in her paternal grandmother; Heart disease in her father and maternal grandmother; Hypertension in her mother; Stroke in her father. ?Allergies  ?Allergen Reactions  ? Nitrofurantoin Nausea And Vomiting  ?  GI upset  ? Sulfamethoxazole-Trimethoprim Nausea And Vomiting and Other (See Comments)  ?  GI upset  ? ? ? ?Review of Systems  ?Constitutional:  Negative for fatigue.  ?Eyes:  Negative for visual disturbance.  ?Respiratory:  Negative for cough, chest tightness, shortness of breath and wheezing.   ?Cardiovascular:  Negative  for chest pain, palpitations and leg swelling.  ?Endocrine: Negative for polydipsia and polyuria.  ?Musculoskeletal:  Positive for arthralgias.  ?Neurological:  Negative for dizziness, seizures, syncope, weakness, light-headedness and headaches.  ? ?   ?Objective:  ? Physical Exam ?Constitutional:   ?   Appearance: She is well-developed.  ?Eyes:  ?   Pupils: Pupils are equal, round, and reactive to light.  ?Neck:  ?   Thyroid: No thyromegaly.  ?   Vascular: No JVD.  ?Cardiovascular:  ?   Rate and Rhythm: Normal rate and regular rhythm.  ?   Heart sounds:  ?  No gallop.  ?Pulmonary:  ?   Effort: Pulmonary effort is normal. No respiratory distress.  ?   Breath sounds: Normal breath sounds. No wheezing or rales.  ?Musculoskeletal:  ?   Cervical back: Neck supple.  ?   Right lower leg: No edema.  ?   Left lower leg: No edema.  ?Neurological:  ?   Mental Status: She is alert.  ? ? ?   ?Assessment:  ?   ?#1 hypertension much improved with recent change of losartan to losartan/HCTZ.  Blood pressure improved by home readings and reading here today as well ? ?#2 hypothyroidism treated with levothyroxine 50 mcg daily ? ?#3 hyperlipidemia treated with simvastatin ?   ?Plan:  ?   ?-Continue current blood pressure medication regimen ?-Recheck labs today with basic metabolic panel, lipid panel, TSH, hepatic panel ?-Try to keep daily sodium intake less than 2500 mg ?-Set up routine follow-up in 6 months and sooner as needed ? ?[name] MD ?[facility] ? ?   ?

## 2021-08-03 LAB — BASIC METABOLIC PANEL
BUN: 33 mg/dL — ABNORMAL HIGH (ref 6–23)
CO2: 30 mEq/L (ref 19–32)
Calcium: 9.6 mg/dL (ref 8.4–10.5)
Chloride: 100 mEq/L (ref 96–112)
Creatinine, Ser: 1.12 mg/dL (ref 0.40–1.20)
GFR: 46.2 mL/min — ABNORMAL LOW (ref >60.00)
Glucose, Bld: 118 mg/dL — ABNORMAL HIGH (ref 70–99)
Potassium: 3.7 mEq/L (ref 3.5–5.1)
Sodium: 139 mEq/L (ref 135–145)

## 2021-08-03 LAB — LIPID PANEL
Cholesterol: 154 mg/dL (ref 0–200)
HDL: 47.8 mg/dL (ref >39.00)
NonHDL: 106.52
Total CHOL/HDL Ratio: 3
Triglycerides: 325 mg/dL — ABNORMAL HIGH (ref 0.0–149.0)
VLDL: 65 mg/dL — ABNORMAL HIGH (ref 0.0–40.0)

## 2021-08-03 LAB — TSH: TSH: 1.38 u[IU]/mL (ref 0.35–5.50)

## 2021-08-03 LAB — HEPATIC FUNCTION PANEL
ALT: 15 U/L (ref 0–35)
AST: 16 U/L (ref 0–37)
Albumin: 4.3 g/dL (ref 3.5–5.2)
Alkaline Phosphatase: 55 U/L (ref 39–117)
Bilirubin, Direct: 0.1 mg/dL (ref 0.0–0.3)
Total Bilirubin: 0.3 mg/dL (ref 0.2–1.2)
Total Protein: 6.9 g/dL (ref 6.0–8.3)

## 2021-08-03 LAB — LDL CHOLESTEROL, DIRECT: Direct LDL: 74 mg/dL

## 2021-08-04 ENCOUNTER — Other Ambulatory Visit: Payer: Self-pay | Admitting: Family Medicine

## 2021-08-04 DIAGNOSIS — R3915 Urgency of urination: Secondary | ICD-10-CM

## 2021-08-10 DIAGNOSIS — M1712 Unilateral primary osteoarthritis, left knee: Secondary | ICD-10-CM | POA: Diagnosis not present

## 2021-09-02 ENCOUNTER — Other Ambulatory Visit: Payer: Self-pay | Admitting: Family Medicine

## 2021-09-02 DIAGNOSIS — R3915 Urgency of urination: Secondary | ICD-10-CM

## 2021-09-04 DIAGNOSIS — H43813 Vitreous degeneration, bilateral: Secondary | ICD-10-CM | POA: Diagnosis not present

## 2021-09-04 DIAGNOSIS — H40013 Open angle with borderline findings, low risk, bilateral: Secondary | ICD-10-CM | POA: Diagnosis not present

## 2021-09-04 DIAGNOSIS — H35372 Puckering of macula, left eye: Secondary | ICD-10-CM | POA: Diagnosis not present

## 2021-09-04 DIAGNOSIS — Z79899 Other long term (current) drug therapy: Secondary | ICD-10-CM | POA: Diagnosis not present

## 2021-09-14 ENCOUNTER — Telehealth: Payer: Self-pay | Admitting: Pharmacist

## 2021-09-14 NOTE — Chronic Care Management (AMB) (Signed)
    Chronic Care Management Pharmacy Assistant   Name: [name]  MRN: [id number] DOB: 13-Jan-1941  09/18/2021 APPOINTMENT REMINDER  [name] was reminded to have all medications, supplements and any blood glucose and blood pressure readings available for review with [name], Pharm. D, at her telephone visit on 09/18/2021 at 11:00.  Care Gaps: AWV - scheduled for 03/19/2022 Last BP - 126/60 on 08/02/2021 Papsmear - overdue Covid booster - over due Shingrix - overdue  Star Rating Drug: Losartan HCTZ 100/12.5 - [facility] Simvastatin '40mg'$  - last filled [facility]21 [facility] verified with [facility]  Any gaps in medications fill history?Yes  [place] Pharmacist Assistant [phone]

## 2021-09-18 ENCOUNTER — Telehealth: Payer: Self-pay | Admitting: Pharmacist

## 2021-09-18 ENCOUNTER — Other Ambulatory Visit: Payer: Self-pay

## 2021-09-18 ENCOUNTER — Ambulatory Visit (INDEPENDENT_AMBULATORY_CARE_PROVIDER_SITE_OTHER): Payer: Medicare Other | Admitting: Pharmacist

## 2021-09-18 VITALS — BP 140/62

## 2021-09-18 DIAGNOSIS — I5033 Acute on chronic diastolic (congestive) heart failure: Secondary | ICD-10-CM

## 2021-09-18 DIAGNOSIS — R3915 Urgency of urination: Secondary | ICD-10-CM

## 2021-09-18 DIAGNOSIS — I10 Essential (primary) hypertension: Secondary | ICD-10-CM

## 2021-09-18 DIAGNOSIS — M8580 Other specified disorders of bone density and structure, unspecified site: Secondary | ICD-10-CM

## 2021-09-18 MED ORDER — SOLIFENACIN SUCCINATE 5 MG PO TABS: 5.0000 mg | ORAL_TABLET | Freq: Every day | ORAL | 1 refills | Status: DC

## 2021-09-18 MED ORDER — SIMVASTATIN 40 MG PO TABS: ORAL_TABLET | ORAL | 1 refills | Status: DC

## 2021-09-18 MED ORDER — LEVOTHYROXINE SODIUM 50 MCG PO TABS: 50.0000 ug | ORAL_TABLET | Freq: Every day | ORAL | 1 refills | Status: DC

## 2021-09-18 MED ORDER — METOPROLOL TARTRATE 25 MG PO TABS: 25.0000 mg | ORAL_TABLET | Freq: Two times a day (BID) | ORAL | 1 refills | Status: DC

## 2021-09-18 MED ORDER — FUROSEMIDE 20 MG PO TABS: 20.0000 mg | ORAL_TABLET | Freq: Every day | ORAL | 1 refills | Status: DC

## 2021-09-18 MED ORDER — LOSARTAN POTASSIUM-HCTZ 100-12.5 MG PO TABS: 1.0000 | ORAL_TABLET | Freq: Every day | ORAL | 1 refills | Status: DC

## 2021-09-18 MED ORDER — AMLODIPINE BESYLATE 5 MG PO TABS: 5.0000 mg | ORAL_TABLET | Freq: Every day | ORAL | 3 refills | Status: DC

## 2021-09-18 NOTE — Chronic Care Management (AMB) (Unsigned)
Chronic Care Management Pharmacy Assistant   Name: [name]  MRN: [id number] DOB: [birth year]  Reason for Encounter: [name]    Recent office visits:  08/02/2021 [name] MD - Patient was seen for primary hypertension and additional issues. No medication changes. Follow up in 6 months.   Recent consult visits:  None  Hospital visits:  None  Medications: Outpatient Encounter Medications as of 09/18/2021  Medication Sig   amLODipine (NORVASC) 5 MG tablet Take 1 tablet (5 mg total) by mouth daily.   furosemide (LASIX) 20 MG tablet Take 1 tablet (20 mg total) by mouth daily.   levothyroxine (SYNTHROID) 50 MCG tablet Take 1 tablet (50 mcg total) by mouth daily before breakfast.   losartan-hydrochlorothiazide (HYZAAR) 100-12.5 MG tablet Take 1 tablet by mouth daily.   Melatonin 3 MG TBDP Take by mouth. (Patient not taking: Reported on 09/18/2021)   metoprolol tartrate (LOPRESSOR) 25 MG tablet Take 1 tablet (25 mg total) by mouth 2 (two) times daily.   Omeprazole-Sodium Bicarbonate (ZEGERID) 20-1100 MG CAPS capsule Take 1 capsule by mouth daily before breakfast.   simvastatin (ZOCOR) 40 MG tablet TAKE 1 TABLET BY MOUTH AT BEDTIME   solifenacin (VESICARE) 5 MG tablet Take 1 tablet (5 mg total) by mouth daily.   tamoxifen (NOLVADEX) 20 MG tablet TAKE 1 TABLET BY MOUTH EVERY DAY   TURMERIC PO Take 500 mg by mouth daily.    Facility-Administered Encounter Medications as of 09/18/2021  Medication   betamethasone acetate-betamethasone sodium phosphate (CELESTONE) injection 12 mg    Fill Dates:  FUROSEMIDE 20 MG TABLET 04/20/2021 90   AMLODIPINE BESYLATE 5 MG TAB 05/08/2021 90   levothyroxine (SYNTHROID, LEVOTHROID) tablet 06/01/2021 90   LOSARTAN POTASSIUM/HYDROCHLOROTHIAZ IDE 100-12.5 MG TABS 09/17/2021 90   metoprolol tartrate (LOPRESSOR) tablet 04/07/2021 90   SOLIFENACIN SUCCINATE  5 MG TABS 09/04/2021 90   SIMVASTATIN 40 MG TABLET 02/23/2021 90   TAMOXIFEN  20 MG TABLET 03/29/2020 90    Referred by: [name], MD  Reason for referral: Chronic care management  Reviewed chart for medication changes ahead of medication coordination call.  BP Readings from Last 3 Encounters:  09/18/21 140/62  08/02/21 126/60  06/28/21 (!) 178/68    Lab Results  Component Value Date   HGBA1C 5.8 11/15/2014     Verbal consent obtained for [facility] enhanced pharmacy services (medication synchronization, adherence packaging, delivery coordination). A medication sync plan was created to allow patient to get all medications delivered once every 30 to 90 days per patient preference. Patient understands they have freedom to choose pharmacy and clinical pharmacist will coordinate care between all prescribers and [facility].  Patient requested to obtain medications through Adherence Packaging  30 Days   Med Sync Plan: Amlodipine 5 mg X    1      September 25, 2021  Furosemide 20 mg X    1      September 25, 2021  Levothyroxine 50 mcg X   1       September 25, 2021  Losartan-HCTZ 100/12.5 mg X    1      September 25, 2021  Metoprolol 25 mg X    1   1   September 25, 2021  Simvastatin 40 mg X       1   September 25, 2021  Solifenacin 5 mg X    1      September 25, 2021  Tamoxifen 20 mg  [name],  Stephanie Dad, MD  1      September 25, 2021  Women's multivitamin  X    1      September 25, 2021     Patient will need a short fill:  Prescriptions requested from PCP and specialists: Left a message with Dr. Virgie Kirby nurse's voice mail to send a new prescription for Tamoxifen to [facility].   Care Gaps: AWV - scheduled 03/19/2022 Last BP - 140/62 on 09/18/2021 Last A1C - 5.8 on 11/15/2014 Papsmear - overdue Covid booster - overdue Shingrix - overdue  Star Rating Drugs: Losartan HCTZ 100/12.5 mg - [facility] Simvastatin 40 mg - last filled 02/23/2021 90 DS at [facility] Pharmacist Assistant [phone]

## 2021-09-18 NOTE — Patient Instructions (Addendum)
Call Dr. [name] office [phone] to schedule a follow up appointment to get refills on your tamoxifen Don't pick [facility] any [facility] from [facility] over the next couple of weeks [facility] [phone] Try to start a multivitamin women's one a day  - we will add to your packaging  [place], PharmD, [place]

## 2021-09-18 NOTE — Progress Notes (Signed)
Chronic Care Management Pharmacy Note  09/18/2021 Name:  Deshara Rossi [place] MRN:  [id number] DOB:  09/13/1940  Summary: BP mostly at goal < 140/90 LDL controlled at goal < 100 Pt is not taking medications as prescribed  Recommendations/Changes made from today's visit: -Recommended regular exercise to improve sleep -Recommended routine BP monitoring at home -Recommended starting a women's multivitamin for calcium & vitamin D supplementation -Transition to adherence packaging for medications  Plan: BP assessment in 1 month Follow up in 3 months  Subjective: Benita Boonstra Duerr is an 81 y.o. year old female who is a primary patient of Burchette, Alinda Sierras, MD.  The CCM team was consulted for assistance with disease management and care coordination needs.    Engaged with patient by telephone for follow up visit in response to provider referral for pharmacy case management and/or care coordination services.   Consent to Services:  The patient was given information about Chronic Care Management services, agreed to services, and gave verbal consent prior to initiation of services.  Please see initial visit note for detailed documentation.   Patient Care Team: [name], MD as PCP - General [name], [name], MD (Inactive) as Consulting Physician (Oncology) Jackolyn Confer, MD as Consulting Physician (General Surgery) Eppie Gibson, MD as Attending Physician (Radiation Oncology) Richmond Campbell, MD as Consulting Physician (Gastroenterology) Lavonna Monarch, MD as Consulting Physician (Dermatology) Delice Bison Charlestine Massed, NP as Nurse Practitioner (Hematology and Oncology) Viona Gilmore, [facility] as Pharmacist (Pharmacist)  Recent office visits: 08/02/21 [name], MD: Patient presented for HTN follow up. No medication changes. Follow up in 6 months.  06/28/2021 [name] MD - Patient was seen for osteopenia and additional issues. Started Losartan/HCTZ 100/12.5  daily.  Discontinued Losartan 50 mg. Follow up in 5 weeks    05/08/2021 [name] MD - Patient was seen for gout and additional issues. Started Amlodipine 5 mg daily. Follow up in 2 weeks.  Recent consult visits: 06/13/21 Theresa Duty (ortho): Patient presented for gel injection. Unable to access notes.  05/18/21 Hector Shade (ortho): Patient presented for OA of knee follow up. Unable to access notes.  04/11/20 Inocencio Homes (podiatry): Unable to access notes.   Hospital visits: None in previous 6 months   Objective:  Lab Results  Component Value Date   CREATININE 1.12 08/02/2021   BUN 33 (H) 08/02/2021   GFR 46.20 (L) 08/02/2021   GFRNONAA >60 03/26/2018   GFRAA >60 03/26/2018   NA 139 08/02/2021   K 3.7 08/02/2021   CALCIUM 9.6 08/02/2021   CO2 30 08/02/2021   GLUCOSE 118 (H) 08/02/2021    Lab Results  Component Value Date/Time   HGBA1C 5.8 11/15/2014 09:24 AM   GFR 46.20 (L) 08/02/2021 02:33 PM   GFR 57.41 (L) 08/01/2020 01:48 PM    Last diabetic Eye exam: No results found for: HMDIABEYEEXA  Last diabetic Foot exam: No results found for: HMDIABFOOTEX   Lab Results  Component Value Date   CHOL 154 08/02/2021   HDL 47.80 08/02/2021   LDLDIRECT 74.0 08/02/2021   TRIG 325.0 (H) 08/02/2021   CHOLHDL 3 08/02/2021       Latest Ref Rng & Units 08/02/2021    2:33 PM 09/16/2020    2:47 PM 08/01/2020    1:48 PM  Hepatic Function  Total Protein 6.0 - 8.3 g/dL 6.9   6.6   6.5    Albumin 3.5 - 5.2 g/dL 4.3    4.0    AST 0 -  37 U/L 16   29   29     ALT 0 - 35 U/L 15   22   27     Alk Phosphatase 39 - 117 U/L 55    62    Total Bilirubin 0.2 - 1.2 mg/dL 0.3   0.4   0.3    Bilirubin, Direct 0.0 - 0.3 mg/dL 0.1    0.1      Lab Results  Component Value Date/Time   TSH 1.38 08/02/2021 02:33 PM   TSH 1.48 08/01/2020 01:48 PM       Latest Ref Rng & Units 03/26/2018    2:42 PM 02/10/2018   11:45 AM 01/03/2017   10:52 AM  CBC  WBC 4.0 - 10.5 K/uL 4.9   5.7   6.1     Hemoglobin 12.0 - 15.0 g/dL 11.6   11.4   15.0    Hematocrit 36.0 - 46.0 % 37.6   34.6   45.0    Platelets 150 - 400 K/uL 214   375.0   246      No results found for: VD25OH  Clinical ASCVD: No  The ASCVD Risk score (Arnett DK, et al., 2019) failed to calculate for the following reasons:   The 2019 ASCVD risk score is only valid for ages 54 to 65       05/08/2021   11:32 AM 03/17/2021    2:11 PM 03/08/2021    2:36 PM  Depression screen PHQ 2/9  Decreased Interest 1 0 0  Down, Depressed, Hopeless 0 0 0  PHQ - 2 Score 1 0 0      Social History   Tobacco Use  Smoking Status Never  Smokeless Tobacco Never   BP Readings from Last 3 Encounters:  09/18/21 140/62  08/02/21 126/60  06/28/21 (!) 178/68   Pulse Readings from Last 3 Encounters:  08/02/21 81  06/28/21 94  05/08/21 100   Wt Readings from Last 3 Encounters:  08/02/21 159 lb 1.6 oz (72.2 kg)  06/28/21 161 lb (73 kg)  05/08/21 163 lb 14.4 oz (74.3 kg)   BMI Readings from Last 3 Encounters:  08/02/21 31.07 kg/m  06/28/21 31.44 kg/m  05/08/21 32.01 kg/m    Assessment/Interventions: Review of patient past medical history, allergies, medications, health status, including review of consultants reports, laboratory and other test data, was performed as part of comprehensive evaluation and provision of chronic care management services.   SDOH:  (Social Determinants of Health) assessments and interventions performed: No  SDOH Screenings   Alcohol Screen: Not on file  Depression (PHQ2-9): Low Risk    PHQ-2 Score: 1  Financial Resource Strain: Low Risk    Difficulty of Paying Living Expenses: Not hard at all  Food Insecurity: No Food Insecurity   Worried About Charity fundraiser in the Last Year: Never true   Ran Out of Food in the Last Year: Never true  Housing: Low Risk    Last Housing Risk Score: 0  Physical Activity: Insufficiently Active   Days of Exercise per Week: 3 days   Minutes of Exercise per  Session: 10 min  Social Connections: Engineer, building services of Communication with Friends and Family: More than three times a week   Frequency of Social Gatherings with Friends and Family: More than three times a week   Attends Religious Services: More than 4 times per year   Active Member of [employer] or Organizations: Yes   Attends Archivist  Meetings: More than 4 times per year   Marital Status: Married  Stress: No Stress Concern Present   Feeling of Stress : Not at all  Tobacco Use: Low Risk    Smoking Tobacco Use: Never   Smokeless Tobacco Use: Never   Passive Exposure: Not on file  Transportation Needs: No Transportation Needs   Lack of Transportation (Medical): No   Lack of Transportation (Non-Medical): No    CCM Care Plan  Allergies  Allergen Reactions   Nitrofurantoin Nausea And Vomiting    GI upset   Sulfamethoxazole-Trimethoprim Nausea And Vomiting and Other (See Comments)    GI upset    Medications Reviewed Today     Reviewed by Viona Gilmore, [facility] (Pharmacist) on 09/18/21 at 1105  Med List Status: <None>   Medication Order Taking? Sig Documenting Provider Last Dose Status Informant  amLODipine (NORVASC) 5 MG tablet [id number] Yes Take 1 tablet (5 mg total) by mouth daily. [name], MD Taking Active   betamethasone acetate-betamethasone sodium phosphate (CELESTONE) injection 12 mg [id number]   Magnus Sinning, MD  Active   furosemide (LASIX) 20 MG tablet [id number] Yes TAKE 1 TABLET BY MOUTH EVERY DAY Isaac Bliss, Rayford Halsted, MD Taking Active   levothyroxine (SYNTHROID) 50 MCG tablet [id number] Yes TAKE 1 TABLET BY MOUTH DAILY BEFORE BREAKFAST Burchette, Alinda Sierras, MD Taking Active   losartan-hydrochlorothiazide [facility]) 100-12.5 MG tablet [id number] Yes Take 1 tablet by mouth daily. [name], MD Taking Active   Melatonin 3 MG TBDP [id number] No Take by mouth.  Patient not taking: Reported on 09/18/2021   [provider]  Not Taking Active   metoprolol tartrate (LOPRESSOR) 25 MG tablet [id number] Yes TAKE 1 TABLET BY MOUTH TWICE A DAY Isaac Bliss, Rayford Halsted, MD Taking Active   Omeprazole-Sodium Bicarbonate (ZEGERID) 20-1100 MG CAPS capsule [id number] Yes Take 1 capsule by mouth daily before breakfast. [provider] Taking Active Self  simvastatin (ZOCOR) 40 MG tablet [id number] Yes TAKE 1 TABLET BY MOUTH AT BEDTIME [name], MD Taking Active   solifenacin (VESICARE) 5 MG tablet [id number] Yes TAKE 1 TABLET (5 MG TOTAL) BY MOUTH DAILY. [name], MD Taking Active   tamoxifen (NOLVADEX) 20 MG tablet [id number] Yes TAKE 1 TABLET BY MOUTH EVERY DAY [name], [name], MD Taking Active   TURMERIC PO [id number] Yes Take 500 mg by mouth daily.  [provider] Taking Active Self            Patient Active Problem List   Diagnosis Date Noted   (HFpEF) heart failure with preserved ejection fraction ([place]) 10/25/2020   Chronic midline low back pain with bilateral sciatica 11/06/2016   Genetic testing 09/23/2015   Malignant neoplasm of upper-outer quadrant of left breast in female, estrogen receptor positive ([place]) 09/07/2015   Family history of breast cancer    Family history of colon cancer    Prediabetes 08/30/2014   Endometriosis    Osteopenia    Hypertension    UTI (lower urinary tract infection) 03/05/2011   Dysuria 03/05/2011   Hypothyroidism 06/06/2009   FATIGUE 06/06/2009   ELEVATED BLOOD PRESSURE 06/06/2009   Disorder of bone and cartilage 09/09/2008   SCOLIOSIS 09/09/2008   Hyperlipidemia 07/30/2008   Migraine headache 07/30/2008   GERD 07/30/2008   ENTHESOPATHY OF HIP REGION 07/30/2008   HEADACHE 07/30/2008    Immunization History  Administered Date(s) Administered   Fluad Quad(high Dose 65+) 02/15/2020, 03/17/2021   Influenza Split 12/26/2011  Influenza, High Dose Seasonal PF 02/09/2015, 02/04/2017, 02/10/2018   PFIZER(Purple Top)SARS-COV-2 Vaccination  05/24/2019, 06/17/2019, 01/14/2020   Pneumococcal Conjugate-13 08/03/2014   Pneumococcal Polysaccharide-23 11/21/2012   Tdap 02/09/2015   Zoster Recombinat (Shingrix) 07/07/2020    Conditions to be addressed/monitored:  Hypertension, Hyperlipidemia, GERD, Hypothyroidism, Depression, Osteopenia and Insomnia, Breast cancer, Migraines  Conditions addressed this visit: Osteopenia, hypertension   Care Plan : [place]  Updates made by Viona Gilmore, [place] since 09/18/2021 12:00 AM     Problem: Problem: Hypertension, Hyperlipidemia, GERD, Hypothyroidism, Depression, Osteopenia and Insomnia, Breast cancer, Migraines      Long-Range Goal: Patient-Specific Goal   Start Date: 09/14/2020  Expected End Date: 09/14/2021  Recent Progress: On track  Priority: High  Note:   Current Barriers:  Unable to independently monitor therapeutic efficacy Unable to self administer medications as prescribed  Pharmacist Clinical Goal(s):  Patient will achieve adherence to monitoring guidelines and medication adherence to achieve therapeutic efficacy achieve control of insomnia as evidenced by improved quality of sleep  through collaboration with PharmD and provider.   Interventions: 1:1 collaboration with [name], MD regarding development and update of comprehensive plan of care as evidenced by provider attestation and co-signature Inter-disciplinary care team collaboration (see longitudinal plan of care) Comprehensive medication review performed; medication list updated in electronic medical record  Hypertension (BP goal <140/90) -Controlled (in office) -Current treatment: Metoprolol 25 mg 1 tablet twice daily - Appropriate, Effective, Safe, Accessible Losartan/HCTZ 100-12.5 mg 1 capsule daily in AM  - Appropriate, Effective, Safe, Accessible Amlodipine 5 mg 1 tablet daily - Appropriate, Effective, Safe, Accessible -Medications previously tried: atenolol -Current home readings:  checking at home but cannot provide readings -Current dietary habits: patient does not use a lot of salt when cooking but does note the saltiness of food when eating out; encouraged her to check nutrition labels when eating out for sodium content -Current exercise habits: not consistently -Denies hypotensive/hypertensive symptoms -Educated on BP goals and benefits of medications for prevention of heart attack, stroke and kidney damage; Exercise goal of 150 minutes per week; Importance of home blood pressure monitoring; Proper BP monitoring technique; -Counseled to monitor BP at home weekly, document, and provide log at future appointments -Counseled on diet and exercise extensively Recommended to continue current medication Recommended taking medications as prescribed.  Hyperlipidemia: (LDL goal < 100) -Controlled -Current treatment: Simvastatin 40 mg 1 tablet at bedtime - Appropriate, Effective, Safe, Accessible -Medications previously tried: none  -Current dietary patterns: drinking more fruit juices in the past year -Current exercise habits: not consistently -Educated on Cholesterol goals;  Benefits of statin for ASCVD risk reduction; Importance of limiting foods high in cholesterol; -Counseled on diet and exercise extensively Recommended to continue current medication Counseled on foods that can elevate triglycerides such as fruit juices, alcohol and sweets  Hypothyroidism (Goal: TSH 0.35-4.5) -Controlled -Current treatment  Levothyroxine 50 mcg 1 tablet daily before breakfast - Appropriate, Effective, Safe, Accessible -Medications previously tried: none  -Counseled on consistent administration with water prior to food and other medications  GERD (Goal: minimize symptoms) -Controlled -Current treatment  Zegerid 20-1100 mg 1 capsule daily before breakfast  - Appropriate, Effective, Safe, Accessible -Medications previously tried: none  -Counseled on long term risks of PPI -  patient reports she has gotten ulcers in the past from taking NSAIDs and takes to protect her stomach while taking Excedrin  Migraines (Goal: control symptoms of migraines) -Controlled -Current treatment  No medications -Medications previously tried: none  -  Patient is not having true migraines and if she catches it early and it will go away; she only takes 1/2 tablet of Excedrin when a headaches comes on which is about 2-3 times a week  Breast cancer (Goal: remission) -Controlled -Current treatment  Tamoxifen 20 mg 1 tablet daily (not filled since 2021) -Medications previously tried: none  -Recommended to continue current medication   Depression/Anxiety (Goal: minimize symptoms) -Controlled -Current treatment: No medications -Medications previously tried/failed: Fluoxetine (unknown), Effexor -PHQ9: 0 -GAD7: n/a -Educated on  not constantly needing a medication -Recommended to continue as is  Insomnia (Goal: improve quality and quantity of sleep) -Uncontrolled -Current treatment  Alteril (Melatonin 3 mg 1 tablet daily (with valerian root) -Medications previously tried: Ambien (side effects), trazodone, Belsomra -Counseled on benefits of regular exercise to improve sleep Recommended repeat sleep study.  Osteopenia (Goal prevent fractures) -Controlled -Last DEXA Scan: 06/26/21   T-Score femoral neck: -1.8  T-Score total hip: n/a  T-Score lumbar spine: n/a  T-Score forearm radius: -1.0  10-year probability of major osteoporotic fracture: 14.4%  10-year probability of hip fracture: 4.0% -Patient is not a candidate for pharmacologic treatment -Current treatment  No medications -Medications previously tried: none  -Recommend [phone] units of vitamin D daily. Recommend 1200 mg of calcium daily from dietary and supplemental sources. Recommend weight-bearing and muscle strengthening exercises for building and maintaining bone density. -Counseled on diet and exercise  extensively Recommended starting daily women's multivitamin for vitamin D and calcium intake.   Health Maintenance -Vaccine gaps: 2nd dose of shingrix, COVID booster -Current therapy:  Turmeric 500 mg 2 tablets daily Chlorhexidine 0.12% solution once daily -Educated on Cost vs benefit of each product must be carefully weighed by individual consumer -Patient is satisfied with current therapy and denies issues -Recommended to continue current medication  Patient Goals/Self-Care Activities Patient will:  - take medications as prescribed check blood pressure weekly, document, and provide at future appointments target a minimum of 150 minutes of moderate intensity exercise weekly  Follow Up Plan: Telephone follow up appointment with care management team member scheduled for: 3 months      Medication Assistance: None required.  Patient affirms current coverage meets needs.  Compliance/Adherence/Medication fill history: Care Gaps: Second dose of Shingrix, COVID booster, PAP smear Last BP - 126/60 on 08/02/2021  Star-Rating Drugs: Losartan HCTZ 100/12.5 - [facility] Simvastatin 48m - last filled [facility]/23/2021 [facility] verified with [facility]  [facility] [address] [place] [place]Phone: [phone]: [phone] [id number] [place] [address] [address] [place]Phone: [phone]: [phone] [facility] [place] [place]Phone: [phone]: [phone] Uses pill box? Yes Pt endorses 60% compliance  We discussed: Benefits of medication synchronization, packaging and delivery as well as enhanced pharmacist oversight with [facility]. Patient  decided to: Utilize [facility] for medication synchronization, packaging and delivery  Care Plan and Follow Up Patient Decision:  Patient agrees to Care Plan and Follow-up.  Plan: Telephone follow up appointment with care management team member scheduled for:  3 months  MJeni Salles PharmD BTrilbyPharmacist [id number] [phone]

## 2021-09-19 ENCOUNTER — Other Ambulatory Visit: Payer: Self-pay | Admitting: *Deleted

## 2021-09-19 MED ORDER — TAMOXIFEN CITRATE 20 MG PO TABS: 20.0000 mg | ORAL_TABLET | Freq: Every day | ORAL | 12 refills | Status: DC

## 2021-09-20 MED ORDER — METOPROLOL TARTRATE 25 MG PO TABS: 25.0000 mg | ORAL_TABLET | Freq: Two times a day (BID) | ORAL | 1 refills | Status: DC

## 2021-09-20 MED ORDER — SOLIFENACIN SUCCINATE 5 MG PO TABS: 5.0000 mg | ORAL_TABLET | Freq: Every day | ORAL | 1 refills | Status: DC

## 2021-09-20 MED ORDER — SIMVASTATIN 40 MG PO TABS: ORAL_TABLET | ORAL | 1 refills | Status: DC

## 2021-09-20 MED ORDER — FUROSEMIDE 20 MG PO TABS: 20.0000 mg | ORAL_TABLET | Freq: Every day | ORAL | 1 refills | Status: DC

## 2021-09-20 MED ORDER — LEVOTHYROXINE SODIUM 50 MCG PO TABS: 50.0000 ug | ORAL_TABLET | Freq: Every day | ORAL | 1 refills | Status: DC

## 2021-09-20 MED ORDER — AMLODIPINE BESYLATE 5 MG PO TABS: 5.0000 mg | ORAL_TABLET | Freq: Every day | ORAL | 1 refills | Status: DC

## 2021-09-20 MED ORDER — LOSARTAN POTASSIUM-HCTZ 100-12.5 MG PO TABS: 1.0000 | ORAL_TABLET | Freq: Every day | ORAL | 1 refills | Status: DC

## 2021-09-20 NOTE — Addendum Note (Signed)
Addended by: Nilda Riggs on: 09/20/2021 01:44 PM   Modules accepted: Orders

## 2021-10-02 ENCOUNTER — Encounter (HOSPITAL_BASED_OUTPATIENT_CLINIC_OR_DEPARTMENT_OTHER): Payer: Self-pay

## 2021-10-02 ENCOUNTER — Other Ambulatory Visit: Payer: Self-pay

## 2021-10-02 DIAGNOSIS — R9431 Abnormal electrocardiogram [ECG] [EKG]: Secondary | ICD-10-CM | POA: Diagnosis not present

## 2021-10-02 DIAGNOSIS — R1032 Left lower quadrant pain: Secondary | ICD-10-CM | POA: Insufficient documentation

## 2021-10-02 DIAGNOSIS — Z79899 Other long term (current) drug therapy: Secondary | ICD-10-CM | POA: Diagnosis not present

## 2021-10-02 DIAGNOSIS — I10 Essential (primary) hypertension: Secondary | ICD-10-CM | POA: Insufficient documentation

## 2021-10-02 DIAGNOSIS — E876 Hypokalemia: Secondary | ICD-10-CM | POA: Diagnosis not present

## 2021-10-02 NOTE — ED Triage Notes (Signed)
Left upper abdominal pain N/V

## 2021-10-03 ENCOUNTER — Emergency Department (HOSPITAL_BASED_OUTPATIENT_CLINIC_OR_DEPARTMENT_OTHER)
Admission: EM | Admit: 2021-10-03 | Discharge: 2021-10-03 | Disposition: A | Discharge status: Home / Self Care | Payer: Medicare Other | Attending: Emergency Medicine | Admitting: Emergency Medicine

## 2021-10-03 DIAGNOSIS — T502X5A Adverse effect of carbonic-anhydrase inhibitors, benzothiadiazides and other diuretics, initial encounter: Secondary | ICD-10-CM

## 2021-10-03 DIAGNOSIS — R1032 Left lower quadrant pain: Secondary | ICD-10-CM

## 2021-10-03 DIAGNOSIS — E876 Hypokalemia: Secondary | ICD-10-CM

## 2021-10-03 LAB — CBC
HCT: 42.8 % (ref 36.0–46.0)
Hemoglobin: 13.9 g/dL (ref 12.0–15.0)
MCH: 27.5 pg (ref 26.0–34.0)
MCHC: 32.5 g/dL (ref 30.0–36.0)
MCV: 84.6 fL (ref 80.0–100.0)
Platelets: 258 10*3/uL (ref 150–400)
RBC: 5.06 MIL/uL (ref 3.87–5.11)
RDW: 13.9 % (ref 11.5–15.5)
WBC: 8.7 10*3/uL (ref 4.0–10.5)
nRBC: 0 % (ref 0.0–0.2)

## 2021-10-03 LAB — URINALYSIS, ROUTINE W REFLEX MICROSCOPIC
Bilirubin Urine: NEGATIVE
Glucose, UA: NEGATIVE mg/dL
Hgb urine dipstick: NEGATIVE
Ketones, ur: NEGATIVE mg/dL
Leukocytes,Ua: NEGATIVE
Nitrite: NEGATIVE
Specific Gravity, Urine: 1.018 (ref 1.005–1.030)
pH: 7 (ref 5.0–8.0)

## 2021-10-03 LAB — COMPREHENSIVE METABOLIC PANEL
ALT: 28 U/L (ref 0–44)
AST: 27 U/L (ref 15–41)
Albumin: 4.8 g/dL (ref 3.5–5.0)
Alkaline Phosphatase: 54 U/L (ref 38–126)
Anion gap: 13 (ref 5–15)
BUN: 13 mg/dL (ref 8–23)
CO2: 26 mmol/L (ref 22–32)
Calcium: 11 mg/dL — ABNORMAL HIGH (ref 8.9–10.3)
Chloride: 101 mmol/L (ref 98–111)
Creatinine, Ser: 0.99 mg/dL (ref 0.44–1.00)
GFR, Estimated: 57 mL/min — ABNORMAL LOW (ref >60)
Glucose, Bld: 138 mg/dL — ABNORMAL HIGH (ref 70–99)
Potassium: 3.3 mmol/L — ABNORMAL LOW (ref 3.5–5.1)
Sodium: 140 mmol/L (ref 135–145)
Total Bilirubin: 0.7 mg/dL (ref 0.3–1.2)
Total Protein: 7.7 g/dL (ref 6.5–8.1)

## 2021-10-03 LAB — LIPASE, BLOOD: Lipase: 20 U/L (ref 11–51)

## 2021-10-03 LAB — TROPONIN I (HIGH SENSITIVITY): Troponin I (High Sensitivity): 8 ng/L (ref <18)

## 2021-10-03 MED ORDER — ONDANSETRON HCL 4 MG/2ML IJ SOLN
4.0000 mg | Freq: Once | INTRAMUSCULAR | Status: AC
Start: 2021-10-03 — End: 2021-10-03
  Administered 2021-10-03: 4 mg via INTRAVENOUS
  Filled 2021-10-03: qty 2

## 2021-10-03 MED ORDER — POTASSIUM CHLORIDE CRYS ER 20 MEQ PO TBCR: 20.0000 meq | EXTENDED_RELEASE_TABLET | Freq: Two times a day (BID) | ORAL | 0 refills | Status: DC

## 2021-10-03 MED ORDER — MORPHINE SULFATE (PF) 4 MG/ML IV SOLN
4.0000 mg | Freq: Once | INTRAVENOUS | Status: AC
  Administered 2021-10-03: 4 mg via INTRAVENOUS
  Filled 2021-10-03: qty 1

## 2021-10-03 MED ORDER — POTASSIUM CHLORIDE CRYS ER 20 MEQ PO TBCR
40.0000 meq | EXTENDED_RELEASE_TABLET | Freq: Once | ORAL | Status: AC
  Administered 2021-10-03: 40 meq via ORAL
  Filled 2021-10-03: qty 2

## 2021-10-03 MED ORDER — ONDANSETRON 8 MG PO TBDP: 8.0000 mg | ORAL_TABLET | Freq: Three times a day (TID) | ORAL | 0 refills | Status: DC | PRN

## 2021-10-03 NOTE — Discharge Instructions (Addendum)
The cause for your pain was not clear, but I was not able to do a CT scan today.  If your pain is getting worse, or you start running a high fever, or you have persistent vomiting, then return to one of the other [facility] emergency departments, where a CT scan can be done.  Your calcium level was high today.  Please have your primary care provider recheck that in 2-4 weeks.  If it is still high, you may need to have additional tests to find out why.

## 2021-10-03 NOTE — ED Provider Notes (Signed)
[facility] EMERGENCY DEPT Provider Note   CSN: [id number] Arrival date & time: 10/02/21  2344     History  Chief Complaint  Patient presents with   Abdominal Pain    [name] is a 81 y.o. female.  The history is provided by the patient.  Abdominal Pain She has history of hypertension, hyperlipidemia, heart failure with preserved ejection fraction and comes in complaining of left lower quadrant pain for the last 5 days.  There has been associated nausea and vomiting but she denies fever chills or sweats.  She denies constipation or diarrhea.  She has done nothing to treat her pain at home.  Since arriving in the ED, she states that pain has subsided greatly.   Home Medications Prior to Admission medications   Medication Sig Start Date End Date Taking? Authorizing Provider  amLODipine (NORVASC) 5 MG tablet Take 1 tablet (5 mg total) by mouth daily. 09/20/21   Burchette, Alinda Sierras, MD  furosemide (LASIX) 20 MG tablet Take 1 tablet (20 mg total) by mouth daily. 09/20/21   Burchette, Alinda Sierras, MD  levothyroxine (SYNTHROID) 50 MCG tablet Take 1 tablet (50 mcg total) by mouth daily before breakfast. 09/20/21   Burchette, Alinda Sierras, MD  losartan-hydrochlorothiazide (HYZAAR) 100-12.5 MG tablet Take 1 tablet by mouth daily. 09/20/21   Burchette, Alinda Sierras, MD  Melatonin 3 MG TBDP Take by mouth. Patient not taking: Reported on 09/18/2021 01/20/18   [provider]  metoprolol tartrate (LOPRESSOR) 25 MG tablet Take 1 tablet (25 mg total) by mouth 2 (two) times daily. 09/20/21   Burchette, Alinda Sierras, MD  Omeprazole-Sodium Bicarbonate (ZEGERID) 20-1100 MG CAPS capsule Take 1 capsule by mouth daily before breakfast.    [provider]  simvastatin (ZOCOR) 40 MG tablet TAKE 1 TABLET BY MOUTH AT BEDTIME 09/20/21   Burchette, Alinda Sierras, MD  solifenacin (VESICARE) 5 MG tablet Take 1 tablet (5 mg total) by mouth daily. 09/20/21   Burchette, Alinda Sierras, MD  tamoxifen (NOLVADEX) 20 MG  tablet Take 1 tablet (20 mg total) by mouth daily. 09/19/21   Benay Pike, MD  TURMERIC PO Take 500 mg by mouth daily.     [provider]      Allergies    Nitrofurantoin and Sulfamethoxazole-trimethoprim    Review of Systems   Review of Systems  Gastrointestinal:  Positive for abdominal pain.  All other systems reviewed and are negative.   Physical Exam Updated Vital Signs BP (!) 148/66   Pulse 81   Temp 97.8 F (36.6 C)   Resp 18   Ht 5' (1.524 m)   Wt 68 kg   SpO2 100%   BMI 29.29 kg/m  Physical Exam Vitals and nursing note reviewed.   81 year old female, resting comfortably and in no acute distress. Vital signs are significant for mildly elevated blood pressure. Oxygen saturation is 100%, which is normal. Head is normocephalic and atraumatic. PERRLA, EOMI. Oropharynx is clear. Neck is nontender and supple without adenopathy or JVD. Back is nontender and there is no CVA tenderness. Lungs are clear without rales, wheezes, or rhonchi. Chest is nontender. Heart has regular rate and rhythm without murmur. Abdomen is soft, flat, with mild tenderness in the left lower quadrant and suprapubic area.  There is no rebound or guarding.  Peristalsis is normoactive. Extremities have no cyanosis or edema, full range of motion is present. Skin is warm and dry without rash. Neurologic: Mental status is normal, cranial nerves are  intact, moves all extremities equally.  ED Results / Procedures / Treatments   Labs (all labs ordered are listed, but only abnormal results are displayed) Labs Reviewed  COMPREHENSIVE METABOLIC PANEL - Abnormal; Notable for the following components:      Result Value   Potassium 3.3 (*)    Glucose, Bld 138 (*)    Calcium 11.0 (*)    GFR, Estimated 57 (*)    All other components within normal limits  URINALYSIS, ROUTINE W REFLEX MICROSCOPIC - Abnormal; Notable for the following components:   Protein, ur TRACE (*)    All other components  within normal limits  LIPASE, BLOOD  CBC  TROPONIN I (HIGH SENSITIVITY)    EKG EKG Interpretation  Date/Time:  Monday October 02 2021 23:55:20 EDT Ventricular Rate:  80 PR Interval:  158 QRS Duration: 80 QT Interval:  376 QTC Calculation: 433 R Axis:   46 Text Interpretation: Normal sinus rhythm Possible Anterior infarct (cited on or before 02-Oct-2016) Abnormal ECG When compared with ECG of 02-Oct-2016 10:57, No significant change was found Confirmed by Delora Fuel ([id number]) on 10/03/2021 12:01:03 AM   Procedures Procedures    Medications Ordered in ED Medications  morphine (PF) 4 MG/ML injection 4 mg (4 mg Intravenous Given 10/03/21 0337)  ondansetron (ZOFRAN) injection 4 mg (4 mg Intravenous Given 10/03/21 0337)  potassium chloride SA (KLOR-CON M) CR tablet 40 mEq (40 mEq Oral Given 10/03/21 9702)    ED Course/ Medical Decision Making/ A&P                           Medical Decision Making Amount and/or Complexity of Data Reviewed Labs: ordered.  Risk Prescription drug management.   Lower abdominal pain of uncertain cause.  Differential is broad and includes, but is not limited to, diverticulitis, urolithiasis, pyelonephritis, ovarian cyst, abdominal aortic aneurysm, small bowel obstruction.  Exam is benign.  I have reviewed and interpreted all of her laboratory tests, and my interpretation is mild hypokalemia secondary to diuretic use, mild to moderate hypercalcemia of uncertain cause.  WBC is normal which is reassuring.  Urinalysis is normal which is also reassuring.  I have independently viewed and interpreted the ECG, and shows no acute ST or T changes, which is reassuring.  Troponin is normal which is also reassuring.  Old records are reviewed, and prior CT scan on 09/05/2016 showed aortic atherosclerosis without aneurysm, no mention of diverticulosis.  I have independently viewed the images, and I do see mild diverticulosis.  CT scan is not available, I have discussed with the  patient and her husband possibility of transfer to another emergency department for CT scan versus close outpatient follow-up with reexamination in the next 36 hours.  Patient and husband have opted for the latter choice.  Without a diagnosis and with no source of infection, I do not feel antibiotics are indicated at this time, no known prior history of diverticulitis and prior CT scans did not show diverticulitis.  She is given a prescription for potassium because of hypokalemia and also prescription for ondansetron for nausea.  Recommended follow-up with PCP in 1 day, return to one of the other [facility] health emergency departments if symptoms are worsening.  Return needs to be 2 another ED because of likely need for CT scan which is not available here.  Final Clinical Impression(s) / ED Diagnoses Final diagnoses:  LLQ pain  Diuretic-induced hypokalemia  Hypercalcemia    Rx / DC  Orders ED Discharge Orders          Ordered    potassium chloride SA (KLOR-CON M) 20 MEQ tablet  2 times daily        10/03/21 0327    ondansetron (ZOFRAN-ODT) 8 MG disintegrating tablet  Every 8 hours PRN        10/03/21 5284              Delora Fuel, MD [birth year] [phone]

## 2021-10-04 ENCOUNTER — Ambulatory Visit (INDEPENDENT_AMBULATORY_CARE_PROVIDER_SITE_OTHER): Payer: Medicare Other | Admitting: Family Medicine

## 2021-10-04 ENCOUNTER — Encounter: Payer: Self-pay | Admitting: Family Medicine

## 2021-10-04 ENCOUNTER — Other Ambulatory Visit: Payer: Self-pay | Admitting: Family Medicine

## 2021-10-04 ENCOUNTER — Telehealth: Payer: Self-pay | Admitting: Family Medicine

## 2021-10-04 ENCOUNTER — Ambulatory Visit
Admission: RE | Admit: 2021-10-04 | Discharge: 2021-10-04 | Disposition: A | Discharge status: Home / Self Care | Payer: Medicare Other | Source: Ambulatory Visit | Attending: Family Medicine | Admitting: Family Medicine

## 2021-10-04 VITALS — BP 140/64 | HR 95 | Temp 97.7°F | Ht 60.0 in | Wt 149.5 lb

## 2021-10-04 DIAGNOSIS — R1030 Lower abdominal pain, unspecified: Secondary | ICD-10-CM

## 2021-10-04 DIAGNOSIS — R9389 Abnormal findings on diagnostic imaging of other specified body structures: Secondary | ICD-10-CM | POA: Diagnosis not present

## 2021-10-04 DIAGNOSIS — R4189 Other symptoms and signs involving cognitive functions and awareness: Secondary | ICD-10-CM | POA: Diagnosis not present

## 2021-10-04 DIAGNOSIS — K7689 Other specified diseases of liver: Secondary | ICD-10-CM | POA: Diagnosis not present

## 2021-10-04 DIAGNOSIS — R634 Abnormal weight loss: Secondary | ICD-10-CM | POA: Diagnosis not present

## 2021-10-04 DIAGNOSIS — K259 Gastric ulcer, unspecified as acute or chronic, without hemorrhage or perforation: Secondary | ICD-10-CM

## 2021-10-04 MED ORDER — OMEPRAZOLE-SODIUM BICARBONATE 40-1100 MG PO CAPS: 1.0000 | ORAL_CAPSULE | Freq: Every day | ORAL | 5 refills | Status: DC

## 2021-10-04 MED ORDER — IOPAMIDOL (ISOVUE-300) INJECTION 61%
100.0000 mL | Freq: Once | INTRAVENOUS | Status: AC | PRN
  Administered 2021-10-04: 100 mL via INTRAVENOUS

## 2021-10-04 NOTE — Addendum Note (Signed)
Addended by: [name] on: 10/04/2021 12:37 PM   Modules accepted: Orders

## 2021-10-04 NOTE — Telephone Encounter (Signed)
Pt states this prescription for omeprazole-sodium bicarbonate (ZEGERID) 40-1100 MG capsule   is very expensive and are looking for a less expensive option

## 2021-10-04 NOTE — Telephone Encounter (Signed)
See separate message.

## 2021-10-04 NOTE — Progress Notes (Addendum)
Established Patient Office Visit  Subjective   Patient ID: [name], female    DOB: [birth year]  Age: 81 y.o. MRN: [id number]  Chief Complaint  Patient presents with   Hospitalization Follow-up    HPI  Past Surgical History:  Procedure Laterality Date   APPENDECTOMY     BREAST BIOPSY Left 08/19/2015   BREAST LUMPECTOMY Left 09/08/2015   BREAST LUMPECTOMY WITH RADIOACTIVE SEED AND SENTINEL LYMPH NODE BIOPSY Left 09/09/2015   Procedure: LEFT BREAST LUMPECTOMY WITH RADIOACTIVE SEED AND SENTINEL LYMPH NODE BIOPSY;  Surgeon: Jackolyn Confer, MD;  Location: [facility];  Service: General;  Laterality: Left;   COLONOSCOPY     DILATATION & CURETTAGE/HYSTEROSCOPY WITH MYOSURE N/A 10/09/2016   Procedure: [place];  Surgeon: Princess Bruins, MD;  Location: [facility];  Service: Gynecology;  Laterality: N/A;  requesting 7:30am in our block  requests one hour   ESOPHAGOGASTRODUODENOSCOPY ENDOSCOPY     IR GENERIC HISTORICAL  12/05/2015   IR CV LINE INJECTION 12/05/2015 [name], MD [facility]INTERV RAD   LAPAROSCOPIC ENDOMETRIOSIS FULGURATION  1978   PELVIC LAPAROSCOPY     PORTACATH PLACEMENT N/A 10/04/2015   Procedure: INSERTION PORT-A-CATH WITH ULTRASOUND;  Surgeon: Jackolyn Confer, MD;  Location: [facility];  Service: General;  Laterality: N/A;   portacath removal     TONSILLECTOMY     ULNAR NERVE REPAIR  2010   Social History   Socioeconomic History   Marital status: Married    Spouse name: Not on file   Number of children: 0   Years of education: Not on file   Highest education level: Not on file  Occupational History   Occupation: retired  Tobacco Use   Smoking status: Never   Smokeless tobacco: Never  Vaping Use   Vaping Use: Never used  Substance and Sexual Activity   Alcohol use: No   Drug use: No   Sexual activity: Never    Birth control/protection: Post-menopausal  Other Topics Concern   Not on file  Social History Narrative    Retired Oncologist.    Social Determinants of Health   Financial Resource Strain: Low Risk  (03/17/2021)   Overall Financial Resource Strain (CARDIA)    Difficulty of Paying Living Expenses: Not hard at all  Food Insecurity: No Food Insecurity (03/17/2021)   Hunger Vital Sign    Worried About Running Out of Food in the Last Year: Never true    Ran Out of Food in the Last Year: Never true  Transportation Needs: No Transportation Needs (03/17/2021)   PRAPARE - Hydrologist (Medical): No    Lack of Transportation (Non-Medical): No  Physical Activity: Insufficiently Active (03/17/2021)   Exercise Vital Sign    Days of Exercise per Week: 3 days    Minutes of Exercise per Session: 10 min  Stress: No Stress Concern Present (03/17/2021)   Dooms    Feeling of Stress : Not at all  Social Connections: [place] (03/17/2021)   Social Connection and Isolation Panel [NHANES]    Frequency of Communication with Friends and Family: More than three times a week    Frequency of Social Gatherings with Friends and Family: More than three times a week    Attends Religious Services: More than 4 times per year    Active Member of [employer] or Organizations: Yes    Attends Archivist Meetings: More than 4 times  per year    Marital Status: Married  Human resources officer Violence: Not At Risk (03/17/2021)   Humiliation, Afraid, Rape, and Kick questionnaire    Fear of Current or Ex-Partner: No    Emotionally Abused: No    Physically Abused: No    Sexually Abused: No     Patient has history of cognitive impairment and is somewhat difficult to follow.  She initially states she had no abdominal pain and then several minutes after she arrived complained of abdominal pain.  She was seen yesterday in the ED at [facility] with complaints of left lower quadrant pain for about 5 days.  She had some  nausea and reported vomiting but no fever.  No reported dysuria.  Occasional constipation but no diarrhea.  No melena.  No hematemesis.  She had lab work in the ER significant for potassium 3.3.  Calcium of 11 but corrected 10.2.  Urinalysis unremarkable.  Their CT scanner was down so they were unable to obtain any abdominal imaging.  Relatively low clinical suspicion for acute diverticulitis.  No prior history of diverticulitis.  No known diverticular disease.  Patient denies any chest pains.  She states she has some epigastric pain but also some left lower quadrant.  Husband is with her and states that she had decreased appetite throughout the week and has lost about 10 pounds.  This is documented from prior weights.  Her troponins and EKG were unremarkable yesterday.  Past Medical History:  Diagnosis Date   Anemia    Anxiety    Arthritis    knees   Atrophic vaginitis    Cancer ([place])    breast cancer - left   Diverticulosis    Endometriosis    Family history of breast cancer    Family history of colon cancer    GERD (gastroesophageal reflux disease)    Headache(784.0)    Heart murmur    as a child only   History of kidney stones    passed stones, no surgery required   History of radiation therapy 01/30/16- 03/02/16   Left Breast 50 Gy in 25 fractions.    Hyperlipidemia    Hypertension    Hypothyroidism    Internal hemorrhoids    Osteopenia    Osteoporosis    Personal history of chemotherapy    Personal history of radiation therapy    Rheumatic fever    age 62   Sleep apnea    " very mild" does not wear CPAP   Thyroid disease    Hypothyroid   Ulcer    UTI (lower urinary tract infection)    Past Surgical History:  Procedure Laterality Date   APPENDECTOMY     BREAST BIOPSY Left 08/19/2015   BREAST LUMPECTOMY Left 09/08/2015   BREAST LUMPECTOMY WITH RADIOACTIVE SEED AND SENTINEL LYMPH NODE BIOPSY Left 09/09/2015   Procedure: LEFT BREAST LUMPECTOMY WITH RADIOACTIVE SEED  AND SENTINEL LYMPH NODE BIOPSY;  Surgeon: Jackolyn Confer, MD;  Location: [facility];  Service: General;  Laterality: Left;   COLONOSCOPY     DILATATION & CURETTAGE/HYSTEROSCOPY WITH MYOSURE N/A 10/09/2016   Procedure: [place];  Surgeon: Princess Bruins, MD;  Location: [facility];  Service: Gynecology;  Laterality: N/A;  requesting 7:30am in our block  requests one hour   ESOPHAGOGASTRODUODENOSCOPY ENDOSCOPY     IR GENERIC HISTORICAL  12/05/2015   IR CV LINE INJECTION 12/05/2015 [name], MD [facility]   LAPAROSCOPIC ENDOMETRIOSIS FULGURATION  1978   PELVIC LAPAROSCOPY  PORTACATH PLACEMENT N/A 10/04/2015   Procedure: INSERTION PORT-A-CATH WITH ULTRASOUND;  Surgeon: Jackolyn Confer, MD;  Location: [facility];  Service: General;  Laterality: N/A;   portacath removal     TONSILLECTOMY     ULNAR NERVE REPAIR  2010    reports that she has never smoked. She has never used smokeless tobacco. She reports that she does not drink alcohol and does not use drugs. family history includes Breast cancer in her mother and paternal grandmother; Colon cancer in her father; Diabetes in her paternal grandmother; Heart disease in her father and maternal grandmother; Hypertension in her mother; Stroke in her father. Allergies  Allergen Reactions   Nitrofurantoin Nausea And Vomiting    GI upset   Sulfamethoxazole-Trimethoprim Nausea And Vomiting and Other (See Comments)    GI upset    Review of Systems  Constitutional:  Positive for malaise/fatigue and weight loss. Negative for chills and fever.  Respiratory:  Negative for cough and shortness of breath.   Cardiovascular:  Negative for chest pain.  Gastrointestinal:  Positive for abdominal pain and nausea. Negative for blood in stool, diarrhea and melena.  Genitourinary:  Negative for dysuria.  Neurological:  Negative for headaches.      Objective:     BP 140/64 (BP Location: Left Arm, Patient Position: Sitting,  Cuff Size: Normal)   Pulse 95   Temp 97.7 F (36.5 C) (Oral)   Ht 5' (1.524 m)   Wt 149 lb 8 oz (67.8 kg)   SpO2 98%   BMI 29.20 kg/m  BP Readings from Last 3 Encounters:  10/04/21 140/64  10/03/21 (!) 176/68  09/18/21 140/62   Wt Readings from Last 3 Encounters:  10/04/21 149 lb 8 oz (67.8 kg)  10/02/21 150 lb (68 kg)  08/02/21 159 lb 1.6 oz (72.2 kg)      Physical Exam Vitals reviewed.  Constitutional:      Appearance: Normal appearance.  Cardiovascular:     Rate and Rhythm: Normal rate and regular rhythm.  Pulmonary:     Effort: Pulmonary effort is normal.     Breath sounds: Normal breath sounds. No wheezing or rales.  Abdominal:     General: Bowel sounds are normal. There is no distension.     Palpations: Abdomen is soft. There is no mass.     Tenderness: There is abdominal tenderness.     Comments: She does have some tenderness left lower quadrant and epigastric and left upper quadrant.  No right upper quadrant tenderness.  No guarding.  Neurological:     Mental Status: She is alert.      No results found for any visits on 10/04/21.  Last CBC Lab Results  Component Value Date   WBC 8.7 10/02/2021   HGB 13.9 10/02/2021   HCT 42.8 10/02/2021   MCV 84.6 10/02/2021   MCH 27.5 10/02/2021   RDW 13.9 10/02/2021   PLT 258 57/32/2025   Last metabolic panel Lab Results  Component Value Date   GLUCOSE 138 (H) 10/02/2021   NA 140 10/02/2021   K 3.3 (L) 10/02/2021   CL 101 10/02/2021   CO2 26 10/02/2021   BUN 13 10/02/2021   CREATININE 0.99 10/02/2021   GFRNONAA 57 (L) 10/02/2021   CALCIUM 11.0 (H) 10/02/2021   PROT 7.7 10/02/2021   ALBUMIN 4.8 10/02/2021   BILITOT 0.7 10/02/2021   ALKPHOS 54 10/02/2021   AST 27 10/02/2021   ALT 28 10/02/2021   ANIONGAP 13 10/02/2021  The ASCVD Risk score (Arnett DK, et al., 2019) failed to calculate for the following reasons:   The 2019 ASCVD risk score is only valid for ages 94 to 64    Assessment & Plan:    Problem List Items Addressed This Visit   None Visit Diagnoses     Lower abdominal pain    -  Primary   Relevant Orders   CT Abdomen Pelvis W Contrast     Patient presents with a 1 week history of abdominal pain initially left lower quadrant but also left upper quadrant and slightly toward midline.  Lab work in [place] including CBC and lipase unremarkable.  Was unable to get further imaging in ER yesterday secondary to CT scanner being down.  No reported fever.  No known history of diverticulitis.  She does have 10 pound weight loss over the past week.  She had mild hypokalemia probably related to decreased intake as well as thiazide therapy.  Is taking potassium supplement now.  -Obtain stat CT abdomen pelvis -We explained she may need to go back to the ER if she has any worsening symptoms -She does have prescription for Zegerid but apparently has not been taking this and will be encouraged to get back on this until further evaluated -Watch for fever, recurrent vomiting, progressive pain.  No follow-ups on file.   CT scan showed 1.2 cm gastric ulcer without perforation. Has been notified.  Her pain symptoms are stable currently.  She apparently has not been taking her Zegerid recently we will start back Zegerid 40 mg daily and also set up GI referral.  Strictly avoid all nonsteroidals.  [name], MD

## 2021-10-05 NOTE — Telephone Encounter (Signed)
Noted  

## 2021-10-11 ENCOUNTER — Telehealth: Payer: Self-pay | Admitting: Pharmacist

## 2021-10-11 NOTE — Chronic Care Management (AMB) (Unsigned)
Chronic Care Management Pharmacy Assistant   Name: [name]  MRN: [id number] DOB: Sep 20, 1940  Reason for Encounter: Medication Review / Medication Coordination Call  Recent office visits:  10/04/2021 [name] MD - Patient was seen for lower abdominal pain and additional issues. CT scan showed 1.2 cm gastric ulcer without perforation. Has been notified.  Her pain symptoms are stable currently.  She apparently has not been taking her Zegerid recently we will start back Zegerid 40 mg daily and also set up GI referral.  Strictly avoid all nonsteroidals. No follow up noted.   Recent consult visits:  None  Hospital visits:  Patient was seen fat [facility] ED on 10/03/2021 (3 hours) due to LLQ pain.   New?Medications Started at [facility] Discharge:?? ZOFRAN-ODT 8 MG take 1 q 8 hours prn nausea or vomiting KLOR-CON M 20 MEQ Take 1 tablet by mouth 2 times daily. Medication Changes at Hospital Discharge: No medication changes Medications Discontinued at Hospital Discharge: No medications discontinued Medications that remain the same after Hospital Discharge:??  -All other medications will remain the same.    Medications: Outpatient Encounter Medications as of 10/11/2021  Medication Sig   amLODipine (NORVASC) 5 MG tablet Take 1 tablet (5 mg total) by mouth daily.   esomeprazole (NEXIUM) 40 MG capsule Take 1 capsule (40 mg total) by mouth daily.   furosemide (LASIX) 20 MG tablet Take 1 tablet (20 mg total) by mouth daily.   levothyroxine (SYNTHROID) 50 MCG tablet Take 1 tablet (50 mcg total) by mouth daily before breakfast.   losartan-hydrochlorothiazide (HYZAAR) 100-12.5 MG tablet Take 1 tablet by mouth daily.   Melatonin 3 MG TBDP Take by mouth.   metoprolol tartrate (LOPRESSOR) 25 MG tablet Take 1 tablet (25 mg total) by mouth 2 (two) times daily.   ondansetron (ZOFRAN-ODT) 8 MG disintegrating tablet Take 1 tablet (8 mg total) by mouth every 8 (eight) hours  as needed for nausea or vomiting.   potassium chloride SA (KLOR-CON M) 20 MEQ tablet Take 1 tablet (20 mEq total) by mouth 2 (two) times daily.   simvastatin (ZOCOR) 40 MG tablet TAKE 1 TABLET BY MOUTH AT BEDTIME   solifenacin (VESICARE) 5 MG tablet Take 1 tablet (5 mg total) by mouth daily.   tamoxifen (NOLVADEX) 20 MG tablet Take 1 tablet (20 mg total) by mouth daily.   TURMERIC PO Take 500 mg by mouth daily.    Facility-Administered Encounter Medications as of 10/11/2021  Medication   betamethasone acetate-betamethasone sodium phosphate (CELESTONE) injection 12 mg  Reviewed chart for medication changes ahead of medication coordination call.  No OVs, Consults, or hospital visits since last care coordination call/Pharmacist visit. (If appropriate, list visit date, provider name)  No medication changes indicated OR if recent visit, treatment plan here.  BP Readings from Last 3 Encounters:  10/04/21 140/64  10/03/21 (!) 176/68  09/18/21 140/62    Lab Results  Component Value Date   HGBA1C 5.8 11/15/2014     Patient obtains medications through Adherence Packaging  30 Days   Last adherence delivery included: New onboard  Patient declined last month: New onboard  Patient is due for next adherence delivery on: 10/23/2021. Called patient and reviewed medications and coordinated delivery.  This delivery to include: Womens 50+ MVI - take 1 tablet at breakfast Tamoxifen 20 mg - take 1 tablet at breakfast Levothyroxine 50 mcg - take 1 tablet before breakfast Furosemide 20 mg  - take 1 tablet at breakfast Simvastatin 40  mg - take 1 tablet at bedtime Solifenacin 5 mg - take 1 tablet at breakfast Metoprolol Tartrate 25 mg - take 1 tablet at breakfast and 1 tablet at bedtime Amlodipine 5 mg - take 1 tablet at breakfast Losartan HCTZ 100/12.5 mg - take 1 tablet at breakfast  Patient will need a short fill:  Coordinated acute fill:  Patient declined the following:   Confirmed  delivery date of 10/23/2021, advised patient that pharmacy will contact them the morning of delivery.  Note: Follow up ED visit, is zegrid helping  Care Gaps: AWV - scheduled 03/19/2022 Last BP - 140/64 on 10/04/2021 Last A1C - 5.8 on 11/15/2014 [facility] - overdue Covid booster- overdue Shingrix - overdue  Star Rating Drugs: Losartan HCTZ 100/12.5 mg - last filled 09/22/21 30 DS at [facility] Simvastatin 40 mg - last filled - 09/22/21 30 DS at [facility] [phone]

## 2021-10-12 NOTE — Telephone Encounter (Signed)
Upon reviewing patient's medication dispense history, [facility] filled losartan 50 mg tablets on 10/04/21 which is an old prescription. Called [facility] to deactivate and make sure it does not get refilled or they do not request further refills.  Called patient to make her aware to not take this as she is taking losartan-HCTZ already in adherence packaging. Patient is aware to discard this bottle.

## 2021-10-13 DIAGNOSIS — I10 Essential (primary) hypertension: Secondary | ICD-10-CM

## 2021-10-13 DIAGNOSIS — E785 Hyperlipidemia, unspecified: Secondary | ICD-10-CM | POA: Diagnosis not present

## 2021-10-13 DIAGNOSIS — E039 Hypothyroidism, unspecified: Secondary | ICD-10-CM | POA: Diagnosis not present

## 2021-10-18 ENCOUNTER — Telehealth: Payer: Self-pay | Admitting: Pharmacist

## 2021-10-18 NOTE — Chronic Care Management (AMB) (Signed)
    Chronic Care Management Pharmacy Assistant  Note made in error.

## 2021-10-23 ENCOUNTER — Ambulatory Visit (INDEPENDENT_AMBULATORY_CARE_PROVIDER_SITE_OTHER): Payer: Medicare Other | Admitting: Family Medicine

## 2021-10-23 ENCOUNTER — Encounter: Payer: Self-pay | Admitting: Family Medicine

## 2021-10-23 VITALS — BP 120/60 | HR 76 | Temp 98.1°F | Ht 60.0 in | Wt 146.3 lb

## 2021-10-23 DIAGNOSIS — R202 Paresthesia of skin: Secondary | ICD-10-CM

## 2021-10-23 DIAGNOSIS — R739 Hyperglycemia, unspecified: Secondary | ICD-10-CM

## 2021-10-23 DIAGNOSIS — K259 Gastric ulcer, unspecified as acute or chronic, without hemorrhage or perforation: Secondary | ICD-10-CM

## 2021-10-23 DIAGNOSIS — N2889 Other specified disorders of kidney and ureter: Secondary | ICD-10-CM

## 2021-10-23 DIAGNOSIS — N289 Disorder of kidney and ureter, unspecified: Secondary | ICD-10-CM | POA: Diagnosis not present

## 2021-10-23 DIAGNOSIS — R9389 Abnormal findings on diagnostic imaging of other specified body structures: Secondary | ICD-10-CM

## 2021-10-23 LAB — HEMOGLOBIN A1C: Hgb A1c MFr Bld: 6.3 % (ref 4.6–6.5)

## 2021-10-23 LAB — VITAMIN B12: Vitamin B-12: 627 pg/mL (ref 211–911)

## 2021-10-23 NOTE — Patient Instructions (Signed)
Let me know if you have not heard from gastroenterologist in 2-3 weeks  Setting up GYN referral and CT left kidney

## 2021-10-23 NOTE — Progress Notes (Unsigned)
Established Patient Office Visit  Subjective   Patient ID: [name], female    DOB: Sep 07, 1940  Age: 81 y.o. MRN: [id number]  Chief Complaint  Patient presents with   Medication Consultation    HPI  {History (Optional):23778} [name] has chronic problems including history of migraine headaches, hypertension, heart failure with preserved ejection fraction, GERD, hypothyroidism, history of breast cancer, hyperlipidemia, mild cognitive impairment.  She is accompanied by husband today.  We discussed multiple issues as follows  She had presented recently with abdominal pain.  We ended up getting CT scan which showed 1.2 cm gastric ulcer without perforation.  No recent nonsteroidal use.  No alcohol use.  We initially tried to get her back on Zegerid-but insurance would not cover.  She is on Nexium 40 mg daily.  No abdominal pain whatsoever.  No nausea or vomiting.  No hematemesis.  No melena.  We did place referral to GI and they still have not heard anything.  We are checking on the referral process. She is not aware of any prior testing for Helicobacter pylori.  There were several other things commented on during her recent CT scan.  There is indeterminant left kidney lesion with recommendation to consider CT dedicated to the kidney with contrast versus MRI.  Also comment of thickened endometrial stripe of 8 mm.  She denies any vaginal bleeding.  She relates some intermittent mild tingling in both feet.  No hand involvement.  No weakness.  No recent B12 level.  Past Medical History:  Diagnosis Date   Anemia    Anxiety    Arthritis    knees   Atrophic vaginitis    Cancer ([place])    breast cancer - left   Diverticulosis    Endometriosis    Family history of breast cancer    Family history of colon cancer    GERD (gastroesophageal reflux disease)    Headache(784.0)    Heart murmur    as a child only   History of kidney stones    passed stones, no surgery required   History  of radiation therapy 01/30/16- 03/02/16   Left Breast 50 Gy in 25 fractions.    Hyperlipidemia    Hypertension    Hypothyroidism    Internal hemorrhoids    Osteopenia    Osteoporosis    Personal history of chemotherapy    Personal history of radiation therapy    Rheumatic fever    age 81   Sleep apnea    " very mild" does not wear CPAP   Thyroid disease    Hypothyroid   Ulcer    UTI (lower urinary tract infection)    Past Surgical History:  Procedure Laterality Date   APPENDECTOMY     BREAST BIOPSY Left 08/19/2015   BREAST LUMPECTOMY Left 09/08/2015   BREAST LUMPECTOMY WITH RADIOACTIVE SEED AND SENTINEL LYMPH NODE BIOPSY Left 09/09/2015   Procedure: LEFT BREAST LUMPECTOMY WITH RADIOACTIVE SEED AND SENTINEL LYMPH NODE BIOPSY;  Surgeon: Jackolyn Confer, MD;  Location: [facility];  Service: General;  Laterality: Left;   COLONOSCOPY     DILATATION & CURETTAGE/HYSTEROSCOPY WITH MYOSURE N/A 10/09/2016   Procedure: [place];  Surgeon: Princess Bruins, MD;  Location: [facility];  Service: Gynecology;  Laterality: N/A;  requesting 7:30am in our block  requests one hour   ESOPHAGOGASTRODUODENOSCOPY ENDOSCOPY     IR GENERIC HISTORICAL  12/05/2015   IR CV LINE INJECTION 12/05/2015 [name], MD [facility]  LAPAROSCOPIC ENDOMETRIOSIS FULGURATION  1978   PELVIC LAPAROSCOPY     PORTACATH PLACEMENT N/A 10/04/2015   Procedure: INSERTION PORT-A-CATH WITH ULTRASOUND;  Surgeon: Jackolyn Confer, MD;  Location: [facility];  Service: General;  Laterality: N/A;   portacath removal     TONSILLECTOMY     ULNAR NERVE REPAIR  2010    reports that she has never smoked. She has never used smokeless tobacco. She reports that she does not drink alcohol and does not use drugs. family history includes Breast cancer in her mother and paternal grandmother; Colon cancer in her father; Diabetes in her paternal grandmother; Heart disease in her father and maternal grandmother;  Hypertension in her mother; Stroke in her father. Allergies  Allergen Reactions   Nitrofurantoin Nausea And Vomiting    GI upset   Sulfamethoxazole-Trimethoprim Nausea And Vomiting and Other (See Comments)    GI upset    Review of Systems  Constitutional:  Negative for malaise/fatigue.  Eyes:  Negative for blurred vision.  Respiratory:  Negative for shortness of breath.   Cardiovascular:  Negative for chest pain.  Gastrointestinal:  Negative for abdominal pain, nausea and vomiting.  Genitourinary:  Negative for dysuria and hematuria.  Neurological:  Negative for dizziness, weakness and headaches.      Objective:     BP 120/60 (BP Location: Left Arm, Patient Position: Sitting, Cuff Size: Normal)   Pulse 76   Temp 98.1 F (36.7 C) (Oral)   Ht 5' (1.524 m)   Wt 146 lb 4.8 oz (66.4 kg)   SpO2 98%   BMI 28.57 kg/m  {Vitals History (Optional):23777}  Physical Exam Constitutional:      Appearance: She is well-developed. She is not ill-appearing or toxic-appearing.  Eyes:     Pupils: Pupils are equal, round, and reactive to light.  Neck:     Thyroid: No thyromegaly.     Vascular: No JVD.  Cardiovascular:     Rate and Rhythm: Normal rate and regular rhythm.     Heart sounds:     No gallop.  Pulmonary:     Effort: Pulmonary effort is normal. No respiratory distress.     Breath sounds: Normal breath sounds. No wheezing or rales.  Abdominal:     Palpations: Abdomen is soft.     Tenderness: There is no abdominal tenderness. There is no guarding or rebound.  Musculoskeletal:     Cervical back: Neck supple.  Neurological:     General: No focal deficit present.     Mental Status: She is alert.     Cranial Nerves: No cranial nerve deficit.     Comments: Normal sensory function in both feet with monofilament testing and to touch.      Results for orders placed or performed in visit on 10/23/21  Hemoglobin A1c  Result Value Ref Range   Hgb A1c MFr Bld 6.3 4.6 - 6.5 %   Vitamin B12  Result Value Ref Range   Vitamin B-12 627 211 - 911 pg/mL    {Labs (Optional):23779}  The ASCVD Risk score (Arnett DK, et al., 2019) failed to calculate for the following reasons:   The 2019 ASCVD risk score is only valid for ages 76 to 25    Assessment & Plan:   #1 paresthesias involving both feet.  She has normal sensory function with monofilament testing.  She complains more of a "tingling "sensation.  Check A1c and B12 level  #2 recently noted gastric ulcer on CT scan.  No signs  of bleeding.  Recent hemoglobin stable.  No melena or hematemesis.  No abdominal pain whatsoever.  Stressed the importance of absolutely no nonsteroidals.  Continue Nexium 40 mg daily.  We placed GI referral but apparently they have not been contacted yet.  We are looking into this.  #3 indeterminate left kidney lesion on CT scan.  Set up dedicated imaging  #4 thickened endometrial stripe 8 mm.  No vaginal bleeding.  Consider GYN referral to further assess.   No follow-ups on file.    [name], MD

## 2021-11-06 ENCOUNTER — Encounter (HOSPITAL_BASED_OUTPATIENT_CLINIC_OR_DEPARTMENT_OTHER): Payer: Self-pay

## 2021-11-06 ENCOUNTER — Ambulatory Visit (HOSPITAL_BASED_OUTPATIENT_CLINIC_OR_DEPARTMENT_OTHER)
Admission: RE | Admit: 2021-11-06 | Discharge: 2021-11-06 | Disposition: A | Discharge status: Home / Self Care | Payer: Medicare Other | Source: Ambulatory Visit | Attending: Family Medicine | Admitting: Family Medicine

## 2021-11-06 DIAGNOSIS — N2889 Other specified disorders of kidney and ureter: Secondary | ICD-10-CM | POA: Insufficient documentation

## 2021-11-06 DIAGNOSIS — N289 Disorder of kidney and ureter, unspecified: Secondary | ICD-10-CM | POA: Diagnosis not present

## 2021-11-06 MED ORDER — IOHEXOL 300 MG/ML  SOLN
100.0000 mL | Freq: Once | INTRAMUSCULAR | Status: AC | PRN
  Administered 2021-11-06: 75 mL via INTRAVENOUS

## 2021-11-09 ENCOUNTER — Telehealth: Payer: Self-pay | Admitting: Pharmacist

## 2021-11-09 NOTE — Chronic Care Management (AMB) (Signed)
Chronic Care Management Pharmacy Assistant   Name: [name]  MRN: [id number] DOB: 07-19-1940  Reason for Encounter: Medication Review / Medication Coordination Call  Recent office visits:  10/23/2021 [name] MD - Patient was seen for Gastric ulcer without hemorrhage or perforation, unspecified chronicity and additional issues. Referral to GYN.  No mediation Changes. No follow up noted.  Recent consult visits:  None  Hospital visits:  None  Medications: Outpatient Encounter Medications as of 11/09/2021  Medication Sig   amLODipine (NORVASC) 5 MG tablet Take 1 tablet (5 mg total) by mouth daily.   esomeprazole (NEXIUM) 40 MG capsule Take 1 capsule (40 mg total) by mouth daily.   furosemide (LASIX) 20 MG tablet Take 1 tablet (20 mg total) by mouth daily.   levothyroxine (SYNTHROID) 50 MCG tablet Take 1 tablet (50 mcg total) by mouth daily before breakfast.   losartan-hydrochlorothiazide (HYZAAR) 100-12.5 MG tablet Take 1 tablet by mouth daily.   Melatonin 3 MG TBDP Take by mouth.   metoprolol tartrate (LOPRESSOR) 25 MG tablet Take 1 tablet (25 mg total) by mouth 2 (two) times daily.   ondansetron (ZOFRAN-ODT) 8 MG disintegrating tablet Take 1 tablet (8 mg total) by mouth every 8 (eight) hours as needed for nausea or vomiting.   potassium chloride SA (KLOR-CON M) 20 MEQ tablet Take 1 tablet (20 mEq total) by mouth 2 (two) times daily.   simvastatin (ZOCOR) 40 MG tablet TAKE 1 TABLET BY MOUTH AT BEDTIME   solifenacin (VESICARE) 5 MG tablet Take 1 tablet (5 mg total) by mouth daily.   tamoxifen (NOLVADEX) 20 MG tablet Take 1 tablet (20 mg total) by mouth daily.   TURMERIC PO Take 500 mg by mouth daily.    Facility-Administered Encounter Medications as of 11/09/2021  Medication   betamethasone acetate-betamethasone sodium phosphate (CELESTONE) injection 12 mg  Reviewed chart for medication changes ahead of medication coordination call.  No OVs, Consults, or  hospital visits since last care coordination call/Pharmacist visit. (If appropriate, list visit date, provider name)  No medication changes indicated OR if recent visit, treatment plan here.  BP Readings from Last 3 Encounters:  10/23/21 120/60  10/04/21 140/64  10/03/21 (!) 176/68    Lab Results  Component Value Date   HGBA1C 6.3 10/23/2021     Patient obtains medications through Adherence Packaging  30 Days    Last adherence delivery included: Womens 50+ MVI - take 1 tablet at breakfast Tamoxifen 20 mg - take 1 tablet at breakfast Levothyroxine 50 mcg - take 1 tablet before breakfast Furosemide 20 mg  - take 1 tablet at breakfast Simvastatin 40 mg - take 1 tablet at bedtime Solifenacin 5 mg - take 1 tablet at breakfast Metoprolol Tartrate 25 mg - take 1 tablet at breakfast and 1 tablet at bedtime Amlodipine 5 mg - take 1 tablet at breakfast Losartan HCTZ 100/12.5 mg - take 1 tablet at breakfast   Patient declined last month: No medications declined    Patient is due for next adherence delivery on: 11/22/2021  Called patient and reviewed medications and coordinated delivery.   This delivery to include: Womens 50+ MVI - take 1 tablet at breakfast Tamoxifen 20 mg - take 1 tablet at breakfast Levothyroxine 50 mcg - take 1 tablet before breakfast Furosemide 20 mg  - take 1 tablet at breakfast Simvastatin 40 mg - take 1 tablet at bedtime Solifenacin 5 mg - take 1 tablet at breakfast Metoprolol Tartrate 25 mg - take  1 tablet at breakfast and 1 tablet at bedtime Amlodipine 5 mg - take 1 tablet at breakfast Losartan HCTZ 100/12.5 mg - take 1 tablet at breakfast   Patient will need a short fill: No short fills needed   Coordinated acute fill: No acute filles needed   Patient declined the following: No medications declined   Confirmed delivery date of 11/22/2021, advised patient that pharmacy will contact them the morning of delivery.   Care Gaps: AWV - scheduled  03/19/2022 Last BP - 120/60 on 10/23/2021 Last A1C - 6.3 on 10/23/2021 Papsmear - overdue Covid booster - overdue Shingrix - overdue  Star Rating Drugs: Losartan HCTZ 100/12.5 mg - last filled 10/17/2021 30 DS at [facility] Simvastatin 40 mg - last filled - 10/17/2021 30 DS at [facility] [phone]

## 2021-11-10 NOTE — Progress Notes (Signed)
GYNECOLOGY  VISIT   HPI: 81 y.o.   Married White or Caucasian Not Hispanic or Latino  female   G0P0 with No LMP recorded. Patient is postmenopausal.   here for incidental finding on CT scan done on 10/04/2021 for LLQ pain of a thickened endometrial stripe of 59m. Pt denies any vaginal bleeding.   Not sexually active.   10/04/21 CT, reproductive portion of the exam: Reproductive: The endometrial stripe appears thickened measuring up to 8 mm. No discrete mass identified. Small calcifications are noted within the uterine fundus. No adnexal mass identified.  She underwent a hysteroscopy, D&C in 6/18 with Dr LDellis Filbertand was noted to have benign endometrial polyps.   Pap from 5/18 satisfactory and negative, TZ was absent/insufficient. No h/o abnormal pap smear.   H/O breast cancer, on tamoxifen.   GYNECOLOGIC HISTORY: No LMP recorded. Patient is postmenopausal. Contraception: PMP Menopausal hormone therapy: none        OB History     Gravida  0   Para      Term      Preterm      AB      Living         SAB      IAB      Ectopic      Multiple      Live Births                 Patient Active Problem List   Diagnosis Date Noted   Cognitive impairment 10/04/2021   (HFpEF) heart failure with preserved ejection fraction ([facility] 10/25/2020   Chronic midline low back pain with bilateral sciatica 11/06/2016   Genetic testing 09/23/2015   Malignant neoplasm of upper-outer quadrant of left breast in female, estrogen receptor positive ([facility] 09/07/2015   Family history of breast cancer    Family history of colon cancer    Prediabetes 08/30/2014   Endometriosis    Osteopenia    Hypertension    UTI (lower urinary tract infection) 03/05/2011   Dysuria 03/05/2011   Hypothyroidism 06/06/2009   FATIGUE 06/06/2009   ELEVATED BLOOD PRESSURE 06/06/2009   Disorder of bone and cartilage 09/09/2008   SCOLIOSIS 09/09/2008   Hyperlipidemia 07/30/2008   Migraine headache  07/30/2008   GERD 07/30/2008   ENTHESOPATHY OF HIP REGION 07/30/2008   HEADACHE 07/30/2008    Past Medical History:  Diagnosis Date   Anemia    Anxiety    Arthritis    knees   Atrophic vaginitis    Cancer ([facility]    breast cancer - left   Diverticulosis    Endometriosis    Family history of breast cancer    Family history of colon cancer    GERD (gastroesophageal reflux disease)    Headache(784.0)    Heart murmur    as a child only   History of kidney stones    passed stones, no surgery required   History of radiation therapy 01/30/16- 03/02/16   Left Breast 50 Gy in 25 fractions.    Hyperlipidemia    Hypertension    Hypothyroidism    Internal hemorrhoids    Osteopenia    Osteoporosis    Personal history of chemotherapy    Personal history of radiation therapy    Rheumatic fever    age 81  Sleep apnea    " very mild" does not wear CPAP   Thyroid disease    Hypothyroid   Ulcer    UTI (lower urinary  tract infection)     Past Surgical History:  Procedure Laterality Date   APPENDECTOMY     BREAST BIOPSY Left 08/19/2015   BREAST LUMPECTOMY Left 09/08/2015   BREAST LUMPECTOMY WITH RADIOACTIVE SEED AND SENTINEL LYMPH NODE BIOPSY Left 09/09/2015   Procedure: LEFT BREAST LUMPECTOMY WITH RADIOACTIVE SEED AND SENTINEL LYMPH NODE BIOPSY;  Surgeon: Jackolyn Confer, MD;  Location: [facility];  Service: General;  Laterality: Left;   COLONOSCOPY     DILATATION & CURETTAGE/HYSTEROSCOPY WITH MYOSURE N/A 10/09/2016   Procedure: [place];  Surgeon: Princess Bruins, MD;  Location: [facility];  Service: Gynecology;  Laterality: N/A;  requesting 7:30am in our block  requests one hour   ESOPHAGOGASTRODUODENOSCOPY ENDOSCOPY     IR GENERIC HISTORICAL  12/05/2015   IR CV LINE INJECTION 12/05/2015 [name], MD [facility]   LAPAROSCOPIC ENDOMETRIOSIS FULGURATION  1978   PELVIC LAPAROSCOPY     PORTACATH PLACEMENT N/A 10/04/2015   Procedure: INSERTION  PORT-A-CATH WITH ULTRASOUND;  Surgeon: Jackolyn Confer, MD;  Location: [facility];  Service: General;  Laterality: N/A;   portacath removal     TONSILLECTOMY     ULNAR NERVE REPAIR  2010    Current Outpatient Medications  Medication Sig Dispense Refill   amLODipine (NORVASC) 5 MG tablet Take 1 tablet (5 mg total) by mouth daily. 90 tablet 1   esomeprazole (NEXIUM) 40 MG capsule Take 1 capsule (40 mg total) by mouth daily. 90 capsule 1   furosemide (LASIX) 20 MG tablet Take 1 tablet (20 mg total) by mouth daily. 90 tablet 1   levothyroxine (SYNTHROID) 50 MCG tablet Take 1 tablet (50 mcg total) by mouth daily before breakfast. 90 tablet 1   losartan-hydrochlorothiazide (HYZAAR) 100-12.5 MG tablet Take 1 tablet by mouth daily. 90 tablet 1   Melatonin 3 MG TBDP Take by mouth.     metoprolol tartrate (LOPRESSOR) 25 MG tablet Take 1 tablet (25 mg total) by mouth 2 (two) times daily. 180 tablet 1   ondansetron (ZOFRAN-ODT) 8 MG disintegrating tablet Take 1 tablet (8 mg total) by mouth every 8 (eight) hours as needed for nausea or vomiting. 20 tablet 0   potassium chloride SA (KLOR-CON M) 20 MEQ tablet Take 1 tablet (20 mEq total) by mouth 2 (two) times daily. 10 tablet 0   simvastatin (ZOCOR) 40 MG tablet TAKE 1 TABLET BY MOUTH AT BEDTIME 90 tablet 1   tamoxifen (NOLVADEX) 20 MG tablet Take 1 tablet (20 mg total) by mouth daily. 30 tablet 12   TURMERIC PO Take 500 mg by mouth daily.      Current Facility-Administered Medications  Medication Dose Route Frequency Provider Last Rate Last Admin   betamethasone acetate-betamethasone sodium phosphate (CELESTONE) injection 12 mg  12 mg Other Once Magnus Sinning, MD         ALLERGIES: Nitrofurantoin and Sulfamethoxazole-trimethoprim  Family History  Problem Relation Age of Onset   Breast cancer Mother        Age 7   Hypertension Mother    Heart disease Father    Stroke Father    Colon cancer Father        dx 60s   Breast cancer Paternal  Grandmother        Age 82   Diabetes Paternal Grandmother    Heart disease Maternal Grandmother     Social History   Socioeconomic History   Marital status: Married    Spouse name: Not on file   Number of  children: 0   Years of education: Not on file   Highest education level: Not on file  Occupational History   Occupation: retired  Tobacco Use   Smoking status: Never   Smokeless tobacco: Never  Vaping Use   Vaping Use: Never used  Substance and Sexual Activity   Alcohol use: No   Drug use: No   Sexual activity: Not Currently    Birth control/protection: Post-menopausal  Other Topics Concern   Not on file  Social History Narrative   Retired Oncologist.    Social Determinants of Health   Financial Resource Strain: Low Risk  (03/17/2021)   Overall Financial Resource Strain (CARDIA)    Difficulty of Paying Living Expenses: Not hard at all  Food Insecurity: No Food Insecurity (03/17/2021)   Hunger Vital Sign    Worried About Running Out of Food in the Last Year: Never true    Ran Out of Food in the Last Year: Never true  Transportation Needs: No Transportation Needs (03/17/2021)   PRAPARE - Hydrologist (Medical): No    Lack of Transportation (Non-Medical): No  Physical Activity: Insufficiently Active (03/17/2021)   Exercise Vital Sign    Days of Exercise per Week: 3 days    Minutes of Exercise per Session: 10 min  Stress: No Stress Concern Present (03/17/2021)   [place]    Feeling of Stress : Not at all  Social Connections: [place] (03/17/2021)   Social Connection and Isolation Panel [NHANES]    Frequency of Communication with Friends and Family: More than three times a week    Frequency of Social Gatherings with Friends and Family: More than three times a week    Attends Religious Services: More than 4 times per year    Active Member of [employer] or  Organizations: Yes    Attends Archivist Meetings: More than 4 times per year    Marital Status: Married  Human resources officer Violence: Not At Risk (03/17/2021)   Humiliation, Afraid, Rape, and Kick questionnaire    Fear of Current or Ex-Partner: No    Emotionally Abused: No    Physically Abused: No    Sexually Abused: No    Review of Systems  All other systems reviewed and are negative.   PHYSICAL EXAMINATION:    BP 112/70   Pulse 70   Ht '5\' 1"'$  (1.549 m)   Wt 154 lb (69.9 kg)   SpO2 97%   BMI 29.10 kg/m     General appearance: alert, cooperative and appears stated age Abdomen: soft, non-tender; non distended, no masses,  no organomegaly  Pelvic: External genitalia:  no lesions              Urethra:  normal appearing urethra with no masses, tenderness or lesions              Bartholins and Skenes: normal                 Vagina: normal appearing vagina with normal color and discharge, no lesions              Cervix: no lesions              Bimanual Exam:  Uterus:  normal size, contour, position, consistency, mobility, non-tender              Adnexa: no mass, fullness, tenderness  Chaperone was present for exam.  1. Thickened endometrium No vaginal bleeding - US PELVIS TRANSVAGINAL NON-OB (TV ONLY); Future  2. Use of tamoxifen (Nolvadex) No vaginal bleeding - US PELVIS TRANSVAGINAL NON-OB (TV ONLY); Future  3. Screening for cervical cancer Overdue - Cytology - PAP

## 2021-11-24 ENCOUNTER — Encounter: Payer: Self-pay | Admitting: Obstetrics and Gynecology

## 2021-11-24 ENCOUNTER — Ambulatory Visit (INDEPENDENT_AMBULATORY_CARE_PROVIDER_SITE_OTHER): Payer: Medicare Other | Admitting: Obstetrics and Gynecology

## 2021-11-24 ENCOUNTER — Other Ambulatory Visit (HOSPITAL_COMMUNITY)
Admission: RE | Admit: 2021-11-24 | Discharge: 2021-11-24 | Disposition: A | Discharge status: Home / Self Care | Payer: Medicare Other | Source: Ambulatory Visit | Attending: Obstetrics and Gynecology | Admitting: Obstetrics and Gynecology

## 2021-11-24 VITALS — BP 112/70 | HR 70 | Ht 61.0 in | Wt 154.0 lb

## 2021-11-24 DIAGNOSIS — Z124 Encounter for screening for malignant neoplasm of cervix: Secondary | ICD-10-CM | POA: Insufficient documentation

## 2021-11-24 DIAGNOSIS — Z79810 Long term (current) use of selective estrogen receptor modulators (SERMs): Secondary | ICD-10-CM | POA: Diagnosis not present

## 2021-11-24 DIAGNOSIS — R9389 Abnormal findings on diagnostic imaging of other specified body structures: Secondary | ICD-10-CM | POA: Diagnosis not present

## 2021-11-29 LAB — CYTOLOGY - PAP: Diagnosis: NEGATIVE

## 2021-12-05 ENCOUNTER — Other Ambulatory Visit: Payer: Self-pay | Admitting: Obstetrics and Gynecology

## 2021-12-05 ENCOUNTER — Ambulatory Visit: Payer: Medicare Other

## 2021-12-05 ENCOUNTER — Ambulatory Visit (INDEPENDENT_AMBULATORY_CARE_PROVIDER_SITE_OTHER): Payer: Medicare Other | Admitting: Obstetrics and Gynecology

## 2021-12-05 DIAGNOSIS — Z79810 Long term (current) use of selective estrogen receptor modulators (SERMs): Secondary | ICD-10-CM

## 2021-12-05 DIAGNOSIS — R9389 Abnormal findings on diagnostic imaging of other specified body structures: Secondary | ICD-10-CM

## 2021-12-07 ENCOUNTER — Telehealth: Payer: Self-pay | Admitting: Obstetrics and Gynecology

## 2021-12-07 NOTE — Progress Notes (Signed)
GYNECOLOGY  VISIT   HPI: 81 y.o.   Married White or Caucasian Not Hispanic or Latino  female   G0P0 with No LMP recorded. Patient is postmenopausal.   here for  evaluation of a thickened endometrium on CT.  She has a h/o breast cancer and is on tamoxifen, no bleeding   GYNECOLOGIC HISTORY: No LMP recorded. Patient is postmenopausal. Contraception:PMP Menopausal hormone therapy: No, on tamoxifen        OB History     Gravida  0   Para      Term      Preterm      AB      Living         SAB      IAB      Ectopic      Multiple      Live Births                 Patient Active Problem List   Diagnosis Date Noted   Cognitive impairment 10/04/2021   (HFpEF) heart failure with preserved ejection fraction ([place]) 10/25/2020   Fusion of spine of thoracolumbar region 02/21/2018   Chronic midline low back pain with bilateral sciatica 11/06/2016   Genetic testing 09/23/2015   Malignant neoplasm of upper-outer quadrant of left breast in female, estrogen receptor positive ([place]) 09/07/2015   Family history of breast cancer    Family history of colon cancer    Prediabetes 08/30/2014   Endometriosis    Osteopenia    Hypertension    UTI (lower urinary tract infection) 03/05/2011   Dysuria 03/05/2011   Hypothyroidism 06/06/2009   FATIGUE 06/06/2009   ELEVATED BLOOD PRESSURE 06/06/2009   Disorder of bone and cartilage 09/09/2008   SCOLIOSIS 09/09/2008   Hyperlipidemia 07/30/2008   Migraine headache 07/30/2008   GERD 07/30/2008   ENTHESOPATHY OF HIP REGION 07/30/2008   HEADACHE 07/30/2008    Past Medical History:  Diagnosis Date   Anemia    Anxiety    Arthritis    knees   Atrophic vaginitis    Cancer ([place])    breast cancer - left   Diverticulosis    Endometriosis    Family history of breast cancer    Family history of colon cancer    GERD (gastroesophageal reflux disease)    Headache(784.0)    Heart murmur    as a child only   History of kidney stones     passed stones, no surgery required   History of radiation therapy 01/30/16- 03/02/16   Left Breast 50 Gy in 25 fractions.    Hyperlipidemia    Hypertension    Hypothyroidism    Internal hemorrhoids    Osteopenia    Osteoporosis    Personal history of chemotherapy    Personal history of radiation therapy    Rheumatic fever    age 43   Sleep apnea    " very mild" does not wear CPAP   Thyroid disease    Hypothyroid   Ulcer    UTI (lower urinary tract infection)     Past Surgical History:  Procedure Laterality Date   APPENDECTOMY     BREAST BIOPSY Left 08/19/2015   BREAST LUMPECTOMY Left 09/08/2015   BREAST LUMPECTOMY WITH RADIOACTIVE SEED AND SENTINEL LYMPH NODE BIOPSY Left 09/09/2015   Procedure: LEFT BREAST LUMPECTOMY WITH RADIOACTIVE SEED AND SENTINEL LYMPH NODE BIOPSY;  Surgeon: Jackolyn Confer, MD;  Location: [facility];  Service: General;  Laterality: Left;  COLONOSCOPY     DILATATION & CURETTAGE/HYSTEROSCOPY WITH MYOSURE N/A 10/09/2016   Procedure: DILATATION & CURETTAGE/HYSTEROSCOPY WITH MYOSURE;  Surgeon: Princess Bruins, MD;  Location: [facility];  Service: Gynecology;  Laterality: N/A;  requesting 7:30am in our block  requests one hour   ESOPHAGOGASTRODUODENOSCOPY ENDOSCOPY     IR GENERIC HISTORICAL  12/05/2015   IR CV LINE INJECTION 12/05/2015 [name], MD [facility]   LAPAROSCOPIC ENDOMETRIOSIS FULGURATION  1978   PELVIC LAPAROSCOPY     PORTACATH PLACEMENT N/A 10/04/2015   Procedure: INSERTION PORT-A-CATH WITH ULTRASOUND;  Surgeon: Jackolyn Confer, MD;  Location: [facility];  Service: General;  Laterality: N/A;   portacath removal     TONSILLECTOMY     ULNAR NERVE REPAIR  2010    Current Outpatient Medications  Medication Sig Dispense Refill   amLODipine (NORVASC) 5 MG tablet Take 1 tablet (5 mg total) by mouth daily. 90 tablet 1   esomeprazole (NEXIUM) 40 MG capsule Take 1 capsule (40 mg total) by mouth daily. 90 capsule 1   furosemide (LASIX) 20 MG tablet Take 1  tablet (20 mg total) by mouth daily. 90 tablet 1   levothyroxine (SYNTHROID) 50 MCG tablet Take 1 tablet (50 mcg total) by mouth daily before breakfast. 90 tablet 1   losartan-hydrochlorothiazide (HYZAAR) 100-12.5 MG tablet Take 1 tablet by mouth daily. 90 tablet 1   Melatonin 3 MG TBDP Take by mouth.     metoprolol tartrate (LOPRESSOR) 25 MG tablet Take 1 tablet (25 mg total) by mouth 2 (two) times daily. 180 tablet 1   ondansetron (ZOFRAN-ODT) 8 MG disintegrating tablet Take 1 tablet (8 mg total) by mouth every 8 (eight) hours as needed for nausea or vomiting. 20 tablet 0   potassium chloride SA (KLOR-CON M) 20 MEQ tablet Take 1 tablet (20 mEq total) by mouth 2 (two) times daily. 10 tablet 0   simvastatin (ZOCOR) 40 MG tablet TAKE 1 TABLET BY MOUTH AT BEDTIME 90 tablet 1   tamoxifen (NOLVADEX) 20 MG tablet Take 1 tablet (20 mg total) by mouth daily. 30 tablet 12   TURMERIC PO Take 500 mg by mouth daily.      Current Facility-Administered Medications  Medication Dose Route Frequency Provider Last Rate Last Admin   betamethasone acetate-betamethasone sodium phosphate (CELESTONE) injection 12 mg  12 mg Other Once Magnus Sinning, MD         ALLERGIES: Nitrofurantoin and Sulfamethoxazole-trimethoprim  Family History  Problem Relation Age of Onset   Breast cancer Mother        Age 48   Hypertension Mother    Heart disease Father    Stroke Father    Colon cancer Father        dx 41s   Breast cancer Paternal Grandmother        Age 22   Diabetes Paternal Grandmother    Heart disease Maternal Grandmother     Social History   Socioeconomic History   Marital status: Married    Spouse name: Not on file   Number of children: 0   Years of education: Not on file   Highest education level: Not on file  Occupational History   Occupation: retired  Tobacco Use   Smoking status: Never   Smokeless tobacco: Never  Vaping Use   Vaping Use: Never used  Substance and Sexual Activity    Alcohol use: No   Drug use: No   Sexual activity: Not Currently    Birth control/protection: Post-menopausal  Other Topics Concern   Not on file  Social History Narrative   Retired Oncologist.    Social Determinants of Health   Financial Resource Strain: Low Risk  (03/17/2021)   Overall Financial Resource Strain (CARDIA)    Difficulty of Paying Living Expenses: Not hard at all  Food Insecurity: No Food Insecurity (03/17/2021)   Hunger Vital Sign    Worried About Running Out of Food in the Last Year: Never true    Ran Out of Food in the Last Year: Never true  Transportation Needs: No Transportation Needs (03/17/2021)   PRAPARE - Hydrologist (Medical): No    Lack of Transportation (Non-Medical): No  Physical Activity: Insufficiently Active (03/17/2021)   Exercise Vital Sign    Days of Exercise per Week: 3 days    Minutes of Exercise per Session: 10 min  Stress: No Stress Concern Present (03/17/2021)   [place]    Feeling of Stress : Not at all  Social Connections: [place] (03/17/2021)   Social Connection and Isolation Panel [NHANES]    Frequency of Communication with Friends and Family: More than three times a week    Frequency of Social Gatherings with Friends and Family: More than three times a week    Attends Religious Services: More than 4 times per year    Active Member of [employer] or Organizations: Yes    Attends Music therapist: More than 4 times per year    Marital Status: Married  Human resources officer Violence: Not At Risk (03/17/2021)   Humiliation, Afraid, Rape, and Kick questionnaire    Fear of Current or Ex-Partner: No    Emotionally Abused: No    Physically Abused: No    Sexually Abused: No    ROS  PHYSICAL EXAMINATION:    There were no vitals taken for this visit.    General appearance: alert, cooperative and appears stated age  See  U/S report  1. Thickened endometrium Lining is 13.5 mm Recommend hysteroscopy, D&C Will schedule, will pretreat with cytotoec  2. Use of tamoxifen (Nolvadex)

## 2021-12-07 NOTE — Telephone Encounter (Signed)
Surgery: CPT [phone] - Hysteroscopy/D&C/Myosure,   Diagnosis: R93.89 Thickened Endometrium,  on tamoxifen  Location: [facility]  Status: Outpatient  Time: 9 Minutes  Assistant: N/A  Urgency: At Patient's Convenience  Pre-Op Appointment: To Be Scheduled  Post-Op Appointment(s): 1 Week,  Time Out Of Work: Day Of Surgery ONLY  Needs preop clearance with primary

## 2021-12-11 ENCOUNTER — Telehealth: Payer: Self-pay | Admitting: Pharmacist

## 2021-12-11 NOTE — Chronic Care Management (AMB) (Signed)
Chronic Care Management Pharmacy Assistant   Name: [name]  MRN: [id number] DOB: 05-Jan-1941  Reason for Encounter: Disease State and Medication Review / Hypertension Assessment and Medication Coordination Call   Conditions to be addressed/monitored: HTN  Recent office visits:  None  Recent consult visits:  12/05/2021 [name] MD (OBGYN) - Patient was seen for Thickened endometrium and an additional concern. No medication changes. No follow up noted.   11/24/2021 [name] MD (OBGYN) - Patient was seen for Thickened endometrium and an additional concern. Discontinued Vesicare. Follow up for ultrasound.   Hospital visits:  None  Medications: Outpatient Encounter Medications as of 12/11/2021  Medication Sig   amLODipine (NORVASC) 5 MG tablet Take 1 tablet (5 mg total) by mouth daily.   esomeprazole (NEXIUM) 40 MG capsule Take 1 capsule (40 mg total) by mouth daily.   furosemide (LASIX) 20 MG tablet Take 1 tablet (20 mg total) by mouth daily.   levothyroxine (SYNTHROID) 50 MCG tablet Take 1 tablet (50 mcg total) by mouth daily before breakfast.   losartan-hydrochlorothiazide (HYZAAR) 100-12.5 MG tablet Take 1 tablet by mouth daily.   Melatonin 3 MG TBDP Take by mouth.   metoprolol tartrate (LOPRESSOR) 25 MG tablet Take 1 tablet (25 mg total) by mouth 2 (two) times daily.   ondansetron (ZOFRAN-ODT) 8 MG disintegrating tablet Take 1 tablet (8 mg total) by mouth every 8 (eight) hours as needed for nausea or vomiting.   potassium chloride SA (KLOR-CON M) 20 MEQ tablet Take 1 tablet (20 mEq total) by mouth 2 (two) times daily.   simvastatin (ZOCOR) 40 MG tablet TAKE 1 TABLET BY MOUTH AT BEDTIME   tamoxifen (NOLVADEX) 20 MG tablet Take 1 tablet (20 mg total) by mouth daily.   TURMERIC PO Take 500 mg by mouth daily.    Facility-Administered Encounter Medications as of 12/11/2021  Medication   betamethasone acetate-betamethasone sodium phosphate (CELESTONE)  injection 12 mg   Reviewed chart prior to disease state call. Spoke with patient regarding BP  Recent Office Vitals: BP Readings from Last 3 Encounters:  11/24/21 112/70  10/23/21 120/60  10/04/21 140/64   Pulse Readings from Last 3 Encounters:  11/24/21 70  10/23/21 76  10/04/21 95    Wt Readings from Last 3 Encounters:  11/24/21 154 lb (69.9 kg)  10/23/21 146 lb 4.8 oz (66.4 kg)  10/04/21 149 lb 8 oz (67.8 kg)     Kidney Function Lab Results  Component Value Date/Time   CREATININE 0.99 10/02/2021 11:56 PM   CREATININE 1.12 08/02/2021 02:33 PM   CREATININE 0.92 (H) 09/16/2020 02:47 PM   CREATININE 0.83 03/26/2018 02:42 PM   CREATININE 1.1 01/03/2017 10:52 AM   CREATININE 1.2 (H) 07/12/2016 10:58 AM   GFR 46.20 (L) 08/02/2021 02:33 PM   GFRNONAA 57 (L) 10/02/2021 11:56 PM   GFRNONAA >60 03/26/2018 02:42 PM   GFRAA >60 03/26/2018 02:42 PM       Latest Ref Rng & Units 10/02/2021   11:56 PM 08/02/2021    2:33 PM 09/16/2020    2:47 PM  BMP  Glucose 70 - 99 mg/dL 138  118  92   BUN 8 - 23 mg/dL 13  33  22   Creatinine 0.44 - 1.00 mg/dL 0.99  1.12  0.92   BUN/Creat Ratio 6 - 22 (calc)   24   Sodium 135 - 145 mmol/L 140  139  143   Potassium 3.5 - 5.1 mmol/L 3.3  3.7  4.0   Chloride 98 - 111 mmol/L 101  100  108   CO2 22 - 32 mmol/L '26  30  25   '$ Calcium 8.9 - 10.3 mg/dL 11.0  9.6  9.2       Reviewed chart for medication changes ahead of medication coordination call.  No OVs, Consults, or hospital visits since last care coordination call/Pharmacist visit. (If appropriate, list visit date, provider name)  No medication changes indicated OR if recent visit, treatment plan here.  BP Readings from Last 3 Encounters:  11/24/21 112/70  10/23/21 120/60  10/04/21 140/64    Lab Results  Component Value Date   HGBA1C 6.3 10/23/2021    Current antihypertensive regimen:  Amlodipine 5 mg daily Lasix 20 mg daily Losartan HCTZ 100/12.5 mg daily Metoprolol 25 mg twice  daily  How often are you checking your Blood Pressure? Patient was checking blood pressures regular, she has not checked her blood pressure for the past two weeks.  Patient states will start checking blood pressures again and keeping a log.   Current home BP readings: Patient does not have a log however, states her blood pressure readings were always good around 130/80.  What recent interventions/DTPs have been made by any provider to improve Blood Pressure control since last CPP Visit: No recent interventions.  Any recent hospitalizations or ED visits since last visit with CPP? No recent hospital visits.   What diet changes have been made to improve Blood Pressure Control?  Patient follows no specific way of eating Breakfast - patient will have cereal or toast with two eggs daily Lunch - patient will have soup and a sandwich Dinner - patient will have a meal containing a meat and two vegetables.  What exercise is being done to improve your Blood Pressure Control?  Patient is limited due to knee pain however, she does all her own cooking and cleaning.   Adherence Review: Is the patient currently on ACE/ARB medication? Yes Does the patient have >5 day gap between last estimated fill dates? No   Patient obtains medications through Adherence Packaging  30 Days    Last adherence delivery included: Womens 50+ MVI - take 1 tablet at breakfast Tamoxifen 20 mg - take 1 tablet at breakfast Levothyroxine 50 mcg - take 1 tablet before breakfast Furosemide 20 mg  - take 1 tablet at breakfast Simvastatin 40 mg - take 1 tablet at bedtime Solifenacin 5 mg - take 1 tablet at breakfast Metoprolol Tartrate 25 mg - take 1 tablet at breakfast and 1 tablet at bedtime Amlodipine 5 mg - take 1 tablet at breakfast Losartan HCTZ 100/12.5 mg - take 1 tablet at breakfast   Patient declined last month: No medications declined     Patient is due for next adherence delivery on: 12/21/2021   Called patient  and reviewed medications and coordinated delivery.   This delivery to include: Womens 50+ MVI - take 1 tablet at breakfast Tamoxifen 20 mg - take 1 tablet at breakfast Levothyroxine 50 mcg - take 1 tablet before breakfast Furosemide 20 mg  - take 1 tablet at breakfast Simvastatin 40 mg - take 1 tablet at bedtime Metoprolol Tartrate 25 mg - take 1 tablet at breakfast and 1 tablet at bedtime Amlodipine 5 mg - take 1 tablet at breakfast Losartan HCTZ 100/12.5 mg - take 1 tablet at breakfast   Patient will need a short fill: No short fills   Coordinated acute fill: No acute fills   Patient declined  the following:  Solifenacin 5 mg - medication discontinued by OBGYN   Confirmed delivery date of 12/21/2021, advised patient that pharmacy will contact them the morning of delivery.  Care Gaps: AWV - scheduled 03/19/2022 Last BP - 112/70 on 11/24/2021 Last A1C - 6.3 on 10/23/2021 Covid booster - overdue Shingrix - overdue Flu - due  Star Rating Drugs: Losartan HCTZ 100/12.5 mg - last filled 11/20/2021 30 DS at [facility] Simvastatin 40 mg - last filled - 11/20/2021 30 DS at [facility] Pharmacist Assistant [phone]

## 2021-12-13 NOTE — Telephone Encounter (Signed)
Call to home number, Left message to call Sharee Pimple, RN at [facility], [phone], OPT 5.  Call to mobile number, spoke with spouse [name], Left message to call Sharee Pimple, RN at [place], [phone].

## 2021-12-20 ENCOUNTER — Telehealth: Payer: Medicare Other

## 2021-12-21 ENCOUNTER — Other Ambulatory Visit: Payer: Medicare Other

## 2021-12-21 ENCOUNTER — Other Ambulatory Visit: Payer: Medicare Other | Admitting: Obstetrics and Gynecology

## 2021-12-25 DIAGNOSIS — H10023 Other mucopurulent conjunctivitis, bilateral: Secondary | ICD-10-CM | POA: Diagnosis not present

## 2021-12-30 ENCOUNTER — Other Ambulatory Visit: Payer: Self-pay | Admitting: Internal Medicine

## 2021-12-30 DIAGNOSIS — I5033 Acute on chronic diastolic (congestive) heart failure: Secondary | ICD-10-CM

## 2022-01-10 ENCOUNTER — Telehealth: Payer: Self-pay | Admitting: Pharmacist

## 2022-01-10 NOTE — Chronic Care Management (AMB) (Signed)
Chronic Care Management Pharmacy Assistant   Name: [name]  MRN: [id number] DOB: May 09, 1940  Reason for Encounter: Medication Review / Medication Coordination Call   Recent office visits:  None  Recent consult visits:  None  Hospital visits:  None  Medications: Outpatient Encounter Medications as of 01/10/2022  Medication Sig   amLODipine (NORVASC) 5 MG tablet Take 1 tablet (5 mg total) by mouth daily.   esomeprazole (NEXIUM) 40 MG capsule Take 1 capsule (40 mg total) by mouth daily.   furosemide (LASIX) 20 MG tablet Take 1 tablet (20 mg total) by mouth daily.   levothyroxine (SYNTHROID) 50 MCG tablet Take 1 tablet (50 mcg total) by mouth daily before breakfast.   losartan-hydrochlorothiazide (HYZAAR) 100-12.5 MG tablet Take 1 tablet by mouth daily.   Melatonin 3 MG TBDP Take by mouth.   metoprolol tartrate (LOPRESSOR) 25 MG tablet Take 1 tablet (25 mg total) by mouth 2 (two) times daily.   ondansetron (ZOFRAN-ODT) 8 MG disintegrating tablet Take 1 tablet (8 mg total) by mouth every 8 (eight) hours as needed for nausea or vomiting.   potassium chloride SA (KLOR-CON M) 20 MEQ tablet Take 1 tablet (20 mEq total) by mouth 2 (two) times daily.   simvastatin (ZOCOR) 40 MG tablet TAKE 1 TABLET BY MOUTH AT BEDTIME   tamoxifen (NOLVADEX) 20 MG tablet Take 1 tablet (20 mg total) by mouth daily.   TURMERIC PO Take 500 mg by mouth daily.    Facility-Administered Encounter Medications as of 01/10/2022  Medication   betamethasone acetate-betamethasone sodium phosphate (CELESTONE) injection 12 mg   Reviewed chart for medication changes ahead of medication coordination call.  BP Readings from Last 3 Encounters:  11/24/21 112/70  10/23/21 120/60  10/04/21 140/64    Lab Results  Component Value Date   HGBA1C 6.3 10/23/2021     Patient obtains medications through Adherence Packaging  30 Days    Last adherence delivery included: Womens 50+ MVI - take 1 tablet at  breakfast Tamoxifen 20 mg - take 1 tablet at breakfast Levothyroxine 50 mcg - take 1 tablet before breakfast Furosemide 20 mg  - take 1 tablet at breakfast Simvastatin 40 mg - take 1 tablet at bedtime Metoprolol Tartrate 25 mg - take 1 tablet at breakfast and 1 tablet at bedtime Amlodipine 5 mg - take 1 tablet at breakfast Losartan HCTZ 100/12.5 mg - take 1 tablet at breakfast   Patient declined last month: Solifenacin 5 mg - medication discontinued by OBGYN   Patient is due for next adherence delivery on: 01/22/2022   Called patient and reviewed medications and coordinated delivery.   This delivery to include: Womens 50+ MVI - take 1 tablet at breakfast Tamoxifen 20 mg - take 1 tablet at breakfast Levothyroxine 50 mcg - take 1 tablet before breakfast Furosemide 20 mg  - take 1 tablet at breakfast Simvastatin 40 mg - take 1 tablet at bedtime Metoprolol Tartrate 25 mg - take 1 tablet at breakfast and  bedtime Amlodipine 5 mg - take 1 tablet at breakfast Losartan HCTZ 100/12.5 mg - take 1 tablet at breakfast   Patient will need a short fill: No short fills   Coordinated acute fill: No acute fills   Patient declined the following: No medications declined   Confirmed delivery date of 01/22/2022, advised patient that pharmacy will contact them the morning of delivery.   Care Gaps: AWV - scheduled 03/19/2022 Last BP - 112/70 on 11/24/2021 Last A1C - 6.3  on 10/23/2021 Covid booster - overdue Shingrix - overdue Flu - due  Star Rating Drugs: Losartan HCTZ 100/12.5 mg - last filled 12/18/2021 30 DS at [facility] Simvastatin 40 mg - last filled 12/18/2021 30 DS at [facility] [phone]

## 2022-01-10 NOTE — Telephone Encounter (Signed)
Call to home number no answer.   Call placed to mobile, spouse [name] answered. Left message with Spouse to call Sharee Pimple, RN at [facility], [phone], OPT 5.

## 2022-01-11 NOTE — Chronic Care Management (AMB) (Signed)
Spoke with patient, she would like Nexium 40 mg 1 capsule q AM to be filled with [facility]. [facility] contacted and will fax the prescription to [facility].

## 2022-01-19 ENCOUNTER — Telehealth: Payer: Self-pay | Admitting: Pharmacist

## 2022-01-19 NOTE — Chronic Care Management (AMB) (Signed)
    Chronic Care Management Pharmacy Assistant   Name: [name]  MRN: [id number] DOB: 1940-11-05  01/22/2022 APPOINTMENT REMINDER  Clover Mealy [place] was reminded to have all medications, supplements and any blood glucose and blood pressure readings available for review with [name], Pharm. D, at her telephone visit on 01/22/2022 at 1:00.  Care Gaps: AWV - scheduled 03/19/2022 Last BP - 112/70 on 11/24/2021 Last A1C - 6.3 on 10/23/2021 Covid booster - overdue Shingrix - overdue Flu - due  Star Rating Drug: Losartan HCTZ 100/12.5 mg - last filled 01/17/2022 30 DS at [facility] Simvastatin 40 mg - last filled 01/17/2022 30 DS at [facility]  Any gaps in medications fill history? No  [facility]  Catering manager [phone]

## 2022-01-19 NOTE — Progress Notes (Signed)
Chronic Care Management Pharmacy Note  01/22/2022 Name:  [name] MRN:  [id number] DOB:  May 22, 1940  Summary: BP mostly at goal < 140/90 but pt is not checking regularly at home LDL controlled at goal < 100  Recommendations/Changes made from today's visit: -Recommended restarting routine BP monitoring at home -Recommended moving multivitamin to bedtime to avoid interaction with calcium and levothyroxine -Recommend switching simvastatin to atorvastatin 20 mg to avoid DDI with amlodipine  Plan: CPE in 1 month BP assessment in 2 months Follow up in 4 months   Subjective: [name] is an 81 y.o. year old female who is a primary patient of Burchette, Alinda Sierras, MD.  The CCM team was consulted for assistance with disease management and care coordination needs.    Engaged with patient by telephone for follow up visit in response to provider referral for pharmacy case management and/or care coordination services.   Consent to Services:  The patient was given information about Chronic Care Management services, agreed to services, and gave verbal consent prior to initiation of services.  Please see initial visit note for detailed documentation.   Patient Care Team: [name], MD as PCP - General [name], [name], MD (Inactive) as Consulting Physician (Oncology) Jackolyn Confer, MD as Consulting Physician (General Surgery) Eppie Gibson, MD as Attending Physician (Radiation Oncology) Richmond Campbell, MD as Consulting Physician (Gastroenterology) Lavonna Monarch, MD (Inactive) as Consulting Physician (Dermatology) Delice Bison Charlestine Massed, NP as Nurse Practitioner (Hematology and Oncology) Viona Gilmore, [facility] as Pharmacist (Pharmacist)  Recent office visits: 10/23/2021 [name] MD - Patient was seen for Gastric ulcer without hemorrhage or perforation, unspecified chronicity and additional issues. Referral to GYN.  No mediation Changes. No follow  up noted.  10/04/2021 [name] MD - Patient was seen for lower abdominal pain and additional issues. CT scan showed 1.2 cm gastric ulcer without perforation. Will start back Zegerid 40 mg daily and also set up GI referral.  Strictly avoid all nonsteroidals. No follow up noted.   08/02/21 [name], MD: Patient presented for HTN follow up. No medication changes. Follow up in 6 months.  Recent consult visits: 12/05/2021 [name] MD (OBGYN) - Patient was seen for Thickened endometrium and an additional concern. No medication changes. No follow up noted.    11/24/2021 [name] MD (OBGYN) - Patient was seen for Thickened endometrium and an additional concern. Discontinued Vesicare. Follow up for ultrasound.   Hospital visits: Patient was seen fat [facility] on 10/03/2021 (3 hours) due to LLQ pain.   New?Medications Started at [facility] Discharge:?? ZOFRAN-ODT 8 MG take 1 q 8 hours prn nausea or vomiting KLOR-CON M 20 MEQ Take 1 tablet by mouth 2 times daily. Medication Changes at Hospital Discharge: No medication changes Medications Discontinued at Hospital Discharge: No medications discontinued Medications that remain the same after Hospital Discharge:??  -All other medications will remain the same.     Objective:  Lab Results  Component Value Date   CREATININE 0.99 10/02/2021   BUN 13 10/02/2021   GFR 46.20 (L) 08/02/2021   GFRNONAA 57 (L) 10/02/2021   GFRAA >60 03/26/2018   NA 140 10/02/2021   K 3.3 (L) 10/02/2021   CALCIUM 11.0 (H) 10/02/2021   CO2 26 10/02/2021   GLUCOSE 138 (H) 10/02/2021    Lab Results  Component Value Date/Time   HGBA1C 6.3 10/23/2021 02:36 PM   HGBA1C 5.8 11/15/2014 09:24 AM   GFR 46.20 (L) 08/02/2021 02:33 PM  GFR 57.41 (L) 08/01/2020 01:48 PM    Last diabetic Eye exam: No results found for: "HMDIABEYEEXA"  Last diabetic Foot exam: No results found for: "HMDIABFOOTEX"   Lab Results  Component Value Date   CHOL  154 08/02/2021   HDL 47.80 08/02/2021   LDLDIRECT 74.0 08/02/2021   TRIG 325.0 (H) 08/02/2021   CHOLHDL 3 08/02/2021       Latest Ref Rng & Units 10/02/2021   11:56 PM 08/02/2021    2:33 PM 09/16/2020    2:47 PM  Hepatic Function  Total Protein 6.5 - 8.1 g/dL 7.7  6.9  6.6   Albumin 3.5 - 5.0 g/dL 4.8  4.3    AST 15 - 41 U/L _0 ALT 0 - 44 U/L _1 Alk Phosphatase 38 - 126 U/L 54  55    Total Bilirubin 0.3 - 1.2 mg/dL 0.7  0.3  0.4   Bilirubin, Direct 0.0 - 0.3 mg/dL  0.1      Lab Results  Component Value Date/Time   TSH 1.38 08/02/2021 02:33 PM   TSH 1.48 08/01/2020 01:48 PM       Latest Ref Rng & Units 10/02/2021   11:56 PM 03/26/2018    2:42 PM 02/10/2018   11:45 AM  CBC  WBC 4.0 - 10.5 K/uL 8.7  4.9  5.7   Hemoglobin 12.0 - 15.0 g/dL 13.9  11.6  11.4   Hematocrit 36.0 - 46.0 % 42.8  37.6  34.6   Platelets 150 - 400 K/uL 258  214  375.0     No results found for: "VD25OH"  Clinical ASCVD: No  The ASCVD Risk score (Arnett DK, et al., 2019) failed to calculate for the following reasons:   The 2019 ASCVD risk score is only valid for ages 21 to 71       05/08/2021   11:32 AM 03/17/2021    2:11 PM 03/08/2021    2:36 PM  Depression screen PHQ 2/9  Decreased Interest 1 0 0  Down, Depressed, Hopeless 0 0 0  PHQ - 2 Score 1 0 0      Social History   Tobacco Use  Smoking Status Never  Smokeless Tobacco Never   BP Readings from Last 3 Encounters:  11/24/21 112/70  10/23/21 120/60  10/04/21 140/64   Pulse Readings from Last 3 Encounters:  11/24/21 70  10/23/21 76  10/04/21 95   Wt Readings from Last 3 Encounters:  11/24/21 154 lb (69.9 kg)  10/23/21 146 lb 4.8 oz (66.4 kg)  10/04/21 149 lb 8 oz (67.8 kg)   BMI Readings from Last 3 Encounters:  11/24/21 29.10 kg/m  10/23/21 28.57 kg/m  10/04/21 29.20 kg/m    Assessment/Interventions: Review of patient past medical history, allergies, medications, health status, including review of  consultants reports, laboratory and other test data, was performed as part of comprehensive evaluation and provision of chronic care management services.   SDOH:  (Social Determinants of Health) assessments and interventions performed: Yes  SDOH Interventions    Flowsheet Row Chronic Care Management from 01/22/2022 in [place] at [place] from 03/16/2020 in [place] at [place] Interventions -- Intervention Not Indicated  Housing Interventions -- Intervention Not Indicated  Transportation Interventions -- Intervention Not Indicated  Depression Interventions/Treatment  -- PHQ2-9 Score <4 Follow-up Not Indicated  Financial Strain Interventions Intervention Not Indicated Intervention Not  Indicated  Physical Activity Interventions -- Intervention Not Indicated  Stress Interventions -- Intervention Not Indicated  Social Connections Interventions -- Intervention Not Indicated      SDOH Screenings   Food Insecurity: No Food Insecurity (03/17/2021)  Housing: Low Risk  (03/17/2021)  Transportation Needs: No Transportation Needs (03/17/2021)  Alcohol Screen: Low Risk  (03/16/2020)  Depression (PHQ2-9): Low Risk  (05/08/2021)  Financial Resource Strain: Low Risk  (01/22/2022)  Physical Activity: Insufficiently Active (03/17/2021)  Social Connections: Socially Integrated (03/17/2021)  Stress: No Stress Concern Present (03/17/2021)  Tobacco Use: Low Risk  (11/24/2021)    CCM Care Plan  Allergies  Allergen Reactions   Nitrofurantoin Nausea And Vomiting    GI upset   Sulfamethoxazole-Trimethoprim Nausea And Vomiting and Other (See Comments)    GI upset    Medications Reviewed Today     Reviewed by Viona Gilmore, [facility] (Pharmacist) on 01/22/22 at 1315  Med List Status: <None>   Medication Order Taking? Sig Documenting Provider Last Dose Status Informant  amLODipine (NORVASC) 5 MG tablet [id number] Yes Take 1 tablet (5  mg total) by mouth daily. [name], MD Taking Active   betamethasone acetate-betamethasone sodium phosphate (CELESTONE) injection 12 mg [id number]   Magnus Sinning, MD  Active   esomeprazole (NEXIUM) 40 MG capsule [id number] Yes Take 1 capsule (40 mg total) by mouth daily. [name], MD Taking Active   furosemide (LASIX) 20 MG tablet [id number] Yes Take 1 tablet (20 mg total) by mouth daily. [name], MD Taking Active   levothyroxine (SYNTHROID) 50 MCG tablet [id number] Yes Take 1 tablet (50 mcg total) by mouth daily before breakfast. [name], MD Taking Active   losartan-hydrochlorothiazide [facility]) 100-12.5 MG tablet [id number] Yes Take 1 tablet by mouth daily. [name], MD Taking Active   Melatonin 3 MG TBDP [id number]  Take by mouth. [provider]  Active   metoprolol tartrate (LOPRESSOR) 25 MG tablet [id number] Yes Take 1 tablet (25 mg total) by mouth 2 (two) times daily. [name], MD Taking Active   ondansetron (ZOFRAN-ODT) 8 MG disintegrating tablet [id number]  Take 1 tablet (8 mg total) by mouth every 8 (eight) hours as needed for nausea or vomiting. Delora Fuel, MD  Active   potassium chloride SA (KLOR-CON M) 20 MEQ tablet [id number]  Take 1 tablet (20 mEq total) by mouth 2 (two) times daily. Delora Fuel, MD  Active   simvastatin (ZOCOR) 40 MG tablet [id number] Yes TAKE 1 TABLET BY MOUTH AT BEDTIME Burchette, Alinda Sierras, MD Taking Active   tamoxifen (NOLVADEX) 20 MG tablet [id number] Yes Take 1 tablet (20 mg total) by mouth daily. Benay Pike, MD Taking Active   TURMERIC PO [id number] Yes Take 500 mg by mouth daily.  [provider] Taking Active Self            Patient Active Problem List   Diagnosis Date Noted   Cognitive impairment 10/04/2021   (HFpEF) heart failure with preserved ejection fraction ([place]) 10/25/2020   Fusion of spine of thoracolumbar region 02/21/2018   Chronic midline low back pain with  bilateral sciatica 11/06/2016   Genetic testing 09/23/2015   Malignant neoplasm of upper-outer quadrant of left breast in female, estrogen receptor positive ([place]) 09/07/2015   Family history of breast cancer    Family history of colon cancer    Prediabetes 08/30/2014   Endometriosis    Osteopenia    Hypertension    UTI (lower urinary tract infection)  03/05/2011   Dysuria 03/05/2011   Hypothyroidism 06/06/2009   FATIGUE 06/06/2009   ELEVATED BLOOD PRESSURE 06/06/2009   Disorder of bone and cartilage 09/09/2008   SCOLIOSIS 09/09/2008   Hyperlipidemia 07/30/2008   Migraine headache 07/30/2008   GERD 07/30/2008   ENTHESOPATHY OF HIP REGION 07/30/2008   HEADACHE 07/30/2008    Immunization History  Administered Date(s) Administered   Fluad Quad(high Dose 65+) 02/15/2020, 03/17/2021   Influenza Split 12/26/2011   Influenza, High Dose Seasonal PF 02/09/2015, 02/04/2017, 02/10/2018   PFIZER(Purple Top)SARS-COV-2 Vaccination 05/24/2019, 06/17/2019, 01/14/2020   Pneumococcal Conjugate-13 08/03/2014   Pneumococcal Polysaccharide-23 11/21/2012   Tdap 02/09/2015   Zoster Recombinat (Shingrix) 07/07/2020   Patient reports her knees are stiff and she has this pain on and off. She has never been able to touch her toes and doesn't do any exercises to help. She tried Excedrin but this did not help. Recommended voltaren gel for PRN use.  Patient reports she feels fine but this morning her stomach did hurt a little bit. She does sometimes have episodes of stomach pain on and off and never followed up with GI about it. Recommended follow up.  Conditions to be addressed/monitored:  Hypertension, Hyperlipidemia, GERD, Hypothyroidism, Depression, Osteopenia and Insomnia, Breast cancer, Migraines  Conditions addressed this visit: Osteopenia, hypertension, hyperlipidemia   Care Plan : CCM Pharmacy Care Plan  Updates made by Viona Gilmore, [place] since 01/22/2022 12:00 AM     Problem: Problem:  Hypertension, Hyperlipidemia, GERD, Hypothyroidism, Depression, Osteopenia and Insomnia, Breast cancer, Migraines      Long-Range Goal: Patient-Specific Goal   Start Date: 09/14/2020  Expected End Date: 09/14/2021  Recent Progress: On track  Priority: High  Note:   Current Barriers:  Unable to independently monitor therapeutic efficacy Unable to self administer medications as prescribed  Pharmacist Clinical Goal(s):  Patient will achieve adherence to monitoring guidelines and medication adherence to achieve therapeutic efficacy achieve control of insomnia as evidenced by improved quality of sleep  through collaboration with PharmD and provider.   Interventions: 1:1 collaboration with [name], MD regarding development and update of comprehensive plan of care as evidenced by provider attestation and co-signature Inter-disciplinary care team collaboration (see longitudinal plan of care) Comprehensive medication review performed; medication list updated in electronic medical record  Hypertension (BP goal <140/90) -Controlled (in office) -Current treatment: Metoprolol 25 mg 1 tablet twice daily - Appropriate, Effective, Safe, Accessible Losartan/HCTZ 100-12.5 mg 1 capsule daily in AM  - Appropriate, Effective, Safe, Accessible Amlodipine 5 mg 1 tablet daily - Appropriate, Effective, Safe, Accessible -Medications previously tried: atenolol -Current home readings: not checking at home -Current dietary habits: patient does not use a lot of salt when cooking but does note the saltiness of food when eating out; encouraged her to check nutrition labels when eating out for sodium content -Current exercise habits: not consistently -Denies hypotensive/hypertensive symptoms -Educated on BP goals and benefits of medications for prevention of heart attack, stroke and kidney damage; Exercise goal of 150 minutes per week; Importance of home blood pressure monitoring; Proper BP monitoring  technique; -Counseled to monitor BP at home weekly, document, and provide log at future appointments -Counseled on diet and exercise extensively Recommended to continue current medication Recommended taking medications as prescribed.  Hyperlipidemia: (LDL goal < 100) -Controlled -Current treatment: Simvastatin 40 mg 1 tablet at bedtime - Appropriate, Effective, Safe, Accessible -Medications previously tried: none  -Current dietary patterns: drinking more fruit juices in the past year -Current exercise habits: not  consistently -Educated on Cholesterol goals;  Benefits of statin for ASCVD risk reduction; Importance of limiting foods high in cholesterol; -Counseled on diet and exercise extensively Recommended switching simvastatin to atorvastatin due to drug interaction with amlodipine.  Hypothyroidism (Goal: TSH 2.5-4.5) -Not ideally controlled due to osteopenia -Current treatment  Levothyroxine 50 mcg 1 tablet daily before breakfast - Appropriate, Effective, Safe, Accessible -Medications previously tried: none  -Counseled on consistent administration with water prior to food and other medications Consider dose adjustment to aim for higher TSH with concurrent osteopenia.  GERD (Goal: minimize symptoms) -Controlled -Current treatment  Esomeprazole 40 mg 1 capsule daily before breakfast  - Appropriate, Effective, Safe, Accessible -Medications previously tried: Zegerid (cost) -Counseled on long term risks of PPI - patient reports she has gotten ulcers in the past from taking NSAIDs and takes to protect her stomach while taking Excedrin  Migraines (Goal: control symptoms of migraines) -Controlled -Current treatment  No medications -Medications previously tried: none  -Patient is not having true migraines and if she catches it early and it will go away; she only takes 1/2 tablet of Excedrin when a headaches comes on which is about 2-3 times a week  Breast cancer (Goal:  remission) -Controlled -Current treatment  Tamoxifen 20 mg 1 tablet daily - Appropriate, Effective, Safe, Accessible -Medications previously tried: none  -Recommended to continue current medication   Depression/Anxiety (Goal: minimize symptoms) -Controlled -Current treatment: No medications -Medications previously tried/failed: Fluoxetine (unknown), Effexor -PHQ9: 0 -GAD7: n/a -Educated on  not constantly needing a medication -Recommended to continue as is  Insomnia (Goal: improve quality and quantity of sleep) -Uncontrolled -Current treatment  Alteril (Melatonin 3 mg 1 tablet daily (with valerian root) -Medications previously tried: Ambien (side effects), trazodone, Belsomra -Counseled on benefits of regular exercise to improve sleep Recommended repeat sleep study.  Osteopenia (Goal prevent fractures) -Controlled -Last DEXA Scan: 06/26/21   T-Score femoral neck: -1.8  T-Score total hip: n/a  T-Score lumbar spine: n/a  T-Score forearm radius: -1.0  10-year probability of major osteoporotic fracture: 14.4%  10-year probability of hip fracture: 4.0% -Patient is not a candidate for pharmacologic treatment -Current treatment  Multivitamin daily (300 mg of calcium and 1000 units of vitamin D)  - Appropriate, Query effective, Safe, Accessible -Medications previously tried: none  -Recommend [phone] units of vitamin D daily. Recommend 1200 mg of calcium daily from dietary and supplemental sources. Recommend weight-bearing and muscle strengthening exercises for building and maintaining bone density. -Counseled on diet and exercise extensively Recommended repeat DEXA.   Health Maintenance -Vaccine gaps: 2nd dose of shingrix, COVID booster, influenza -Current therapy:  Turmeric 500 mg 2 tablets daily Chlorhexidine 0.12% solution once daily -Educated on Cost vs benefit of each product must be carefully weighed by individual consumer -Patient is satisfied with current therapy and  denies issues -Recommended to continue current medication  Patient Goals/Self-Care Activities Patient will:  - take medications as prescribed check blood pressure weekly, document, and provide at future appointments target a minimum of 150 minutes of moderate intensity exercise weekly  Follow Up Plan: Telephone follow up appointment with care management team member scheduled for: 4 months       Medication Assistance: None required.  Patient affirms current coverage meets needs.  Compliance/Adherence/Medication fill history: Care Gaps: Second dose of shingrix, COVID booster, influenza Last BP - 112/70 on 11/24/2021 Last A1C - 6.3 on 10/23/2021  [facility] Drugs: Losartan HCTZ 100/12.5 mg - last filled 01/17/2022 30 DS at [facility] Simvastatin 40 mg -  last filled 01/17/2022 30 DS at [facility]  Patient's preferred pharmacy is:  [id number] - Closed - [place], [place] - [address] AT [facility] [address] [place] [address] Phone: [phone] Fax: [phone]  [facility] - [place], [place] - [address] [place] [address] [place] [address] Phone: [phone] Fax: [phone]  [facility] [place] [place] [address] [place] [address] Phone: [phone] Fax: [phone]  Uses pill box? Yes Pt endorses 60% compliance  We discussed: Benefits of medication synchronization, packaging and delivery as well as enhanced pharmacist oversight with [facility]. Patient decided to: Utilize [facility] pharmacy for medication synchronization, packaging and delivery  Care Plan and Follow Up Patient Decision:  Patient agrees to Care Plan and Follow-up.  Plan: Telephone follow up appointment with care management team member scheduled for:  4 months  [name], PharmD [place] Pharmacist [place] at  [place] [phone]

## 2022-01-22 ENCOUNTER — Ambulatory Visit (INDEPENDENT_AMBULATORY_CARE_PROVIDER_SITE_OTHER): Payer: Medicare Other | Admitting: Pharmacist

## 2022-01-22 ENCOUNTER — Other Ambulatory Visit: Payer: Self-pay | Admitting: Family Medicine

## 2022-01-22 DIAGNOSIS — M8580 Other specified disorders of bone density and structure, unspecified site: Secondary | ICD-10-CM

## 2022-01-22 DIAGNOSIS — I10 Essential (primary) hypertension: Secondary | ICD-10-CM

## 2022-01-22 MED ORDER — ATORVASTATIN CALCIUM 20 MG PO TABS: 20.0000 mg | ORAL_TABLET | Freq: Every day | ORAL | 3 refills | Status: DC

## 2022-01-22 NOTE — Progress Notes (Signed)
Changing from simvastatin to Lipitor secondary to low but possible risk of interaction with amlodipine  [name] MD [facility]

## 2022-01-22 NOTE — Patient Instructions (Signed)
Hi Miliyah,  It was great to catch up again! Don't forget to restart checking your blood pressure at home.  Please reach out to me if you have any questions or need anything before our follow up!  Best, Maddie  [name], PharmD, [facility]   Visit Information   Goals Addressed   None    Patient Care Plan: CCM Pharmacy Care Plan     Problem Identified: Problem: Hypertension, Hyperlipidemia, GERD, Hypothyroidism, Depression, Osteopenia and Insomnia, Breast cancer, Migraines      Long-Range Goal: Patient-Specific Goal   Start Date: 09/14/2020  Expected End Date: 09/14/2021  Recent Progress: On track  Priority: High  Note:   Current Barriers:  Unable to independently monitor therapeutic efficacy Unable to self administer medications as prescribed  Pharmacist Clinical Goal(s):  Patient will achieve adherence to monitoring guidelines and medication adherence to achieve therapeutic efficacy achieve control of insomnia as evidenced by improved quality of sleep  through collaboration with PharmD and provider.   Interventions: 1:1 collaboration with [name], MD regarding development and update of comprehensive plan of care as evidenced by provider attestation and co-signature Inter-disciplinary care team collaboration (see longitudinal plan of care) Comprehensive medication review performed; medication list updated in electronic medical record  Hypertension (BP goal <140/90) -Controlled (in office) -Current treatment: Metoprolol 25 mg 1 tablet twice daily - Appropriate, Effective, Safe, Accessible Losartan/HCTZ 100-12.5 mg 1 capsule daily in AM  - Appropriate, Effective, Safe, Accessible Amlodipine 5 mg 1 tablet daily - Appropriate, Effective, Safe, Accessible -Medications previously tried: atenolol -Current home readings: not checking at home -Current dietary habits: patient does not use a lot of salt  when cooking but does note the saltiness of food when eating out; encouraged her to check nutrition labels when eating out for sodium content -Current exercise habits: not consistently -Denies hypotensive/hypertensive symptoms -Educated on BP goals and benefits of medications for prevention of heart attack, stroke and kidney damage; Exercise goal of 150 minutes per week; Importance of home blood pressure monitoring; Proper BP monitoring technique; -Counseled to monitor BP at home weekly, document, and provide log at future appointments -Counseled on diet and exercise extensively Recommended to continue current medication Recommended taking medications as prescribed.  Hyperlipidemia: (LDL goal < 100) -Controlled -Current treatment: Simvastatin 40 mg 1 tablet at bedtime - Appropriate, Effective, Safe, Accessible -Medications previously tried: none  -Current dietary patterns: drinking more fruit juices in the past year -Current exercise habits: not consistently -Educated on Cholesterol goals;  Benefits of statin for ASCVD risk reduction; Importance of limiting foods high in cholesterol; -Counseled on diet and exercise extensively Recommended switching simvastatin to atorvastatin due to drug interaction with amlodipine.  Hypothyroidism (Goal: TSH 2.5-4.5) -Not ideally controlled due to osteopenia -Current treatment  Levothyroxine 50 mcg 1 tablet daily before breakfast - Appropriate, Effective, Safe, Accessible -Medications previously tried: none  -Counseled on consistent administration with water prior to food and other medications Consider dose adjustment to aim for higher TSH with concurrent osteopenia.  GERD (Goal: minimize symptoms) -Controlled -Current treatment  Esomeprazole 40 mg 1 capsule daily before breakfast  - Appropriate, Effective, Safe, Accessible -Medications previously tried: Zegerid (cost) -Counseled on long term risks of PPI - patient reports she has gotten ulcers  in the past from taking NSAIDs and takes to protect her stomach while taking Excedrin  Migraines (Goal: control symptoms of migraines) -Controlled -Current treatment  No medications -Medications previously tried: none  -Patient is not  having true migraines and if she catches it early and it will go away; she only takes 1/2 tablet of Excedrin when a headaches comes on which is about 2-3 times a week  Breast cancer (Goal: remission) -Controlled -Current treatment  Tamoxifen 20 mg 1 tablet daily - Appropriate, Effective, Safe, Accessible -Medications previously tried: none  -Recommended to continue current medication   Depression/Anxiety (Goal: minimize symptoms) -Controlled -Current treatment: No medications -Medications previously tried/failed: Fluoxetine (unknown), Effexor -PHQ9: 0 -GAD7: n/a -Educated on  not constantly needing a medication -Recommended to continue as is  Insomnia (Goal: improve quality and quantity of sleep) -Uncontrolled -Current treatment  Alteril (Melatonin 3 mg 1 tablet daily (with valerian root) -Medications previously tried: Ambien (side effects), trazodone, Belsomra -Counseled on benefits of regular exercise to improve sleep Recommended repeat sleep study.  Osteopenia (Goal prevent fractures) -Controlled -Last DEXA Scan: 06/26/21   T-Score femoral neck: -1.8  T-Score total hip: n/a  T-Score lumbar spine: n/a  T-Score forearm radius: -1.0  10-year probability of major osteoporotic fracture: 14.4%  10-year probability of hip fracture: 4.0% -Patient is not a candidate for pharmacologic treatment -Current treatment  Multivitamin daily (300 mg of calcium and 1000 units of vitamin D)  - Appropriate, Query effective, Safe, Accessible -Medications previously tried: none  -Recommend [phone] units of vitamin D daily. Recommend 1200 mg of calcium daily from dietary and supplemental sources. Recommend weight-bearing and muscle strengthening exercises for  building and maintaining bone density. -Counseled on diet and exercise extensively Recommended repeat DEXA.   Health Maintenance -Vaccine gaps: 2nd dose of shingrix, COVID booster, influenza -Current therapy:  Turmeric 500 mg 2 tablets daily Chlorhexidine 0.12% solution once daily -Educated on Cost vs benefit of each product must be carefully weighed by individual consumer -Patient is satisfied with current therapy and denies issues -Recommended to continue current medication  Patient Goals/Self-Care Activities Patient will:  - take medications as prescribed check blood pressure weekly, document, and provide at future appointments target a minimum of 150 minutes of moderate intensity exercise weekly  Follow Up Plan: Telephone follow up appointment with care management team member scheduled for: 4 months       Patient verbalizes understanding of instructions and care plan provided today and agrees to view in [facility]. Active MyChart status and patient understanding of how to access instructions and care plan via MyChart confirmed with patient.    Telephone follow up appointment with pharmacy team member scheduled for: 4 months  Viona Gilmore, [facility]

## 2022-01-30 ENCOUNTER — Encounter (HOSPITAL_BASED_OUTPATIENT_CLINIC_OR_DEPARTMENT_OTHER): Payer: Self-pay | Admitting: Emergency Medicine

## 2022-01-30 ENCOUNTER — Emergency Department (HOSPITAL_BASED_OUTPATIENT_CLINIC_OR_DEPARTMENT_OTHER): Payer: Medicare Other

## 2022-01-30 ENCOUNTER — Other Ambulatory Visit: Payer: Self-pay

## 2022-01-30 ENCOUNTER — Emergency Department (HOSPITAL_BASED_OUTPATIENT_CLINIC_OR_DEPARTMENT_OTHER)
Admission: EM | Admit: 2022-01-30 | Discharge: 2022-01-30 | Disposition: A | Discharge status: Home / Self Care | Payer: Medicare Other | Attending: Emergency Medicine | Admitting: Emergency Medicine

## 2022-01-30 DIAGNOSIS — R110 Nausea: Secondary | ICD-10-CM | POA: Insufficient documentation

## 2022-01-30 DIAGNOSIS — I10 Essential (primary) hypertension: Secondary | ICD-10-CM | POA: Diagnosis not present

## 2022-01-30 DIAGNOSIS — R748 Abnormal levels of other serum enzymes: Secondary | ICD-10-CM | POA: Diagnosis not present

## 2022-01-30 DIAGNOSIS — E039 Hypothyroidism, unspecified: Secondary | ICD-10-CM | POA: Insufficient documentation

## 2022-01-30 DIAGNOSIS — K760 Fatty (change of) liver, not elsewhere classified: Secondary | ICD-10-CM | POA: Diagnosis not present

## 2022-01-30 DIAGNOSIS — Z79899 Other long term (current) drug therapy: Secondary | ICD-10-CM | POA: Insufficient documentation

## 2022-01-30 DIAGNOSIS — K769 Liver disease, unspecified: Secondary | ICD-10-CM | POA: Diagnosis not present

## 2022-01-30 DIAGNOSIS — R1013 Epigastric pain: Secondary | ICD-10-CM | POA: Diagnosis not present

## 2022-01-30 LAB — COMPREHENSIVE METABOLIC PANEL
ALT: 19 U/L (ref 0–44)
AST: 20 U/L (ref 15–41)
Albumin: 4.4 g/dL (ref 3.5–5.0)
Alkaline Phosphatase: 51 U/L (ref 38–126)
Anion gap: 9 (ref 5–15)
BUN: 18 mg/dL (ref 8–23)
CO2: 26 mmol/L (ref 22–32)
Calcium: 10 mg/dL (ref 8.9–10.3)
Chloride: 104 mmol/L (ref 98–111)
Creatinine, Ser: 0.77 mg/dL (ref 0.44–1.00)
GFR, Estimated: >60 mL/min (ref >60)
Glucose, Bld: 99 mg/dL (ref 70–99)
Potassium: 3.8 mmol/L (ref 3.5–5.1)
Sodium: 139 mmol/L (ref 135–145)
Total Bilirubin: 0.3 mg/dL (ref 0.3–1.2)
Total Protein: 7 g/dL (ref 6.5–8.1)

## 2022-01-30 LAB — CBC
HCT: 40.4 % (ref 36.0–46.0)
Hemoglobin: 13 g/dL (ref 12.0–15.0)
MCH: 27.7 pg (ref 26.0–34.0)
MCHC: 32.2 g/dL (ref 30.0–36.0)
MCV: 86.1 fL (ref 80.0–100.0)
Platelets: 220 10*3/uL (ref 150–400)
RBC: 4.69 MIL/uL (ref 3.87–5.11)
RDW: 13.4 % (ref 11.5–15.5)
WBC: 7.7 10*3/uL (ref 4.0–10.5)
nRBC: 0 % (ref 0.0–0.2)

## 2022-01-30 LAB — URINALYSIS, ROUTINE W REFLEX MICROSCOPIC
Bilirubin Urine: NEGATIVE
Glucose, UA: NEGATIVE mg/dL
Ketones, ur: NEGATIVE mg/dL
Nitrite: NEGATIVE
Protein, ur: NEGATIVE mg/dL
Specific Gravity, Urine: 1.007 (ref 1.005–1.030)
pH: 5.5 (ref 5.0–8.0)

## 2022-01-30 LAB — LIPASE, BLOOD: Lipase: 59 U/L — ABNORMAL HIGH (ref 11–51)

## 2022-01-30 MED ORDER — ONDANSETRON HCL 4 MG/2ML IJ SOLN
4.0000 mg | Freq: Once | INTRAMUSCULAR | Status: AC
  Administered 2022-01-30: 4 mg via INTRAVENOUS
  Filled 2022-01-30: qty 2

## 2022-01-30 MED ORDER — ESOMEPRAZOLE MAGNESIUM 40 MG PO CPDR: 40.0000 mg | DELAYED_RELEASE_CAPSULE | Freq: Every day | ORAL | 0 refills | Status: DC

## 2022-01-30 MED ORDER — SODIUM CHLORIDE 0.9 % IV BOLUS
1000.0000 mL | Freq: Once | INTRAVENOUS | Status: AC
  Administered 2022-01-30: 1000 mL via INTRAVENOUS

## 2022-01-30 MED ORDER — IOHEXOL 300 MG/ML  SOLN
100.0000 mL | Freq: Once | INTRAMUSCULAR | Status: AC | PRN
  Administered 2022-01-30: 80 mL via INTRAVENOUS

## 2022-01-30 MED ORDER — MORPHINE SULFATE (PF) 4 MG/ML IV SOLN
4.0000 mg | Freq: Once | INTRAVENOUS | Status: AC
  Administered 2022-01-30: 4 mg via INTRAVENOUS
  Filled 2022-01-30: qty 1

## 2022-01-30 NOTE — ED Triage Notes (Signed)
Pt presents for epigastric pain that woke her up from sleep, has been told she has an ulcer previously. Feels like pressure, no radiation. Denies N/V, fevers, blood in stool. Has been experiencing heartburn recently "but nothing but tonight" Has not tried any OTCs.

## 2022-01-30 NOTE — Discharge Instructions (Addendum)
Begin taking Nexium as prescribed.  Return to the emergency department if you develop severe abdominal pain, high fevers, bloody stools, or for other new and concerning symptoms.

## 2022-01-30 NOTE — ED Provider Notes (Signed)
[facility] EMERGENCY DEPT Provider Note   CSN: [id number] Arrival date & time: 01/30/22  3785     History  Chief Complaint  Patient presents with   Abdominal Pain    [name] is a 81 y.o. female.  Patient is an 81 year old female with past medical history of hyperlipidemia, hypertension, stomach ulcers, hypothyroidism.  Patient presenting today with complaints of epigastric abdominal pain.  This woke her from sleep 2 hours prior to presentation.  She denies having consumed any spicy or fatty foods.  She describes a constant, stabbing pain to the epigastric region.  She has been nauseated, but has not vomited.  Pain is worse with palpation and movement.  There are no alleviating factors.  The history is provided by the patient.       Home Medications Prior to Admission medications   Medication Sig Start Date End Date Taking? Authorizing Provider  amLODipine (NORVASC) 5 MG tablet Take 1 tablet (5 mg total) by mouth daily. 09/20/21   Burchette, Alinda Sierras, MD  atorvastatin (LIPITOR) 20 MG tablet Take 1 tablet (20 mg total) by mouth daily. 01/22/22   Burchette, Alinda Sierras, MD  esomeprazole (NEXIUM) 40 MG capsule Take 1 capsule (40 mg total) by mouth daily. 10/05/21   Burchette, Alinda Sierras, MD  furosemide (LASIX) 20 MG tablet Take 1 tablet (20 mg total) by mouth daily. 09/20/21   Burchette, Alinda Sierras, MD  levothyroxine (SYNTHROID) 50 MCG tablet Take 1 tablet (50 mcg total) by mouth daily before breakfast. 09/20/21   Burchette, Alinda Sierras, MD  losartan-hydrochlorothiazide (HYZAAR) 100-12.5 MG tablet Take 1 tablet by mouth daily. 09/20/21   Burchette, Alinda Sierras, MD  Melatonin 3 MG TBDP Take by mouth. 01/20/18   [provider]  metoprolol tartrate (LOPRESSOR) 25 MG tablet Take 1 tablet (25 mg total) by mouth 2 (two) times daily. 09/20/21   Burchette, Alinda Sierras, MD  ondansetron (ZOFRAN-ODT) 8 MG disintegrating tablet Take 1 tablet (8 mg total) by mouth every 8 (eight) hours as needed  for nausea or vomiting. 8/85/02   Delora Fuel, MD  potassium chloride SA (KLOR-CON M) 20 MEQ tablet Take 1 tablet (20 mEq total) by mouth 2 (two) times daily. 7/74/12   Delora Fuel, MD  tamoxifen (NOLVADEX) 20 MG tablet Take 1 tablet (20 mg total) by mouth daily. 09/19/21   Benay Pike, MD  TURMERIC PO Take 500 mg by mouth daily.     [provider]      Allergies    Nitrofurantoin and Sulfamethoxazole-trimethoprim    Review of Systems   Review of Systems  All other systems reviewed and are negative.   Physical Exam Updated Vital Signs BP (!) 209/57 (BP Location: Right Arm)   Pulse 71   Temp 97.7 F (36.5 C) (Oral)   Resp 20   Wt 68 kg   SpO2 100%   BMI 28.34 kg/m  Physical Exam Vitals and nursing note reviewed.  Constitutional:      General: She is not in acute distress.    Appearance: She is well-developed. She is not diaphoretic.  HENT:     Head: Normocephalic and atraumatic.  Cardiovascular:     Rate and Rhythm: Normal rate and regular rhythm.     Heart sounds: No murmur heard.    No friction rub. No gallop.  Pulmonary:     Effort: Pulmonary effort is normal. No respiratory distress.     Breath sounds: Normal breath sounds. No wheezing.  Abdominal:  General: Bowel sounds are normal. There is no distension.     Palpations: Abdomen is soft.     Tenderness: There is abdominal tenderness in the epigastric area. There is no right CVA tenderness, left CVA tenderness, guarding or rebound.  Musculoskeletal:        General: Normal range of motion.     Cervical back: Normal range of motion and neck supple.  Skin:    General: Skin is warm and dry.  Neurological:     General: No focal deficit present.     Mental Status: She is alert and oriented to person, place, and time.     ED Results / Procedures / Treatments   Labs (all labs ordered are listed, but only abnormal results are displayed) Labs Reviewed  LIPASE, BLOOD - Abnormal; Notable for the  following components:      Result Value   Lipase 59 (*)    All other components within normal limits  URINALYSIS, ROUTINE W REFLEX MICROSCOPIC - Abnormal; Notable for the following components:   APPearance HAZY (*)    Hgb urine dipstick TRACE (*)    Leukocytes,Ua TRACE (*)    Bacteria, UA RARE (*)    All other components within normal limits  COMPREHENSIVE METABOLIC PANEL  CBC    EKG EKG Interpretation  Date/Time:  Tuesday January 30 2022 03:45:15 EDT Ventricular Rate:  72 PR Interval:  188 QRS Duration: 68 QT Interval:  386 QTC Calculation: 422 R Axis:   63 Text Interpretation: Normal sinus rhythm Normal ECG Confirmed by Veryl Speak [phone]) on 01/30/2022 3:52:08 AM  Radiology No results found.  Procedures Procedures    Medications Ordered in ED Medications  sodium chloride 0.9 % bolus 1,000 mL (has no administration in time range)  morphine (PF) 4 MG/ML injection 4 mg (has no administration in time range)  ondansetron (ZOFRAN) injection 4 mg (has no administration in time range)    ED Course/ Medical Decision Making/ A&P  Patient is an 81 year old female presenting with complaints of epigastric pain.  This woke her from sleep in the night.  Patient arrives here afebrile with stable vital signs.  On exam, there is tenderness in the epigastric region, but no peritoneal signs.  Physical examination otherwise unremarkable.  Work-up initiated including CBC, comprehensive metabolic panel, lipase, all of which are unremarkable except for a very mild elevation of lipase of 59.  CT scan of the abdomen and pelvis obtained showing no evidence for pancreatitis or other acute intra-abdominal process and does not provide an explanation for the patient's symptoms.  She does have a history of GERD and it is possible this is the cause.  She has been on Nexium in the past, however but has been off of this for quite some time.  I will represcribe this medication and see if this  helps.  While in the ER, she received morphine with significant improvement in her discomfort.  She now feels well enough to go home.  Patient to return as needed if symptoms worsen or change.  Final Clinical Impression(s) / ED Diagnoses Final diagnoses:  None    Rx / DC Orders ED Discharge Orders     None         Veryl Speak, MD 01/30/22 [phone]

## 2022-02-07 NOTE — Telephone Encounter (Signed)
Spoke with patient. Reviewed surgery dates. Patient request to proceed with surgery on 02/27/22.  Advised patient I will forward to business office for return call. I will return call once surgery date and time confirmed.   Advised RN will request surgical clearance from PCP/ Dr. Elease Hashimoto. Advised PCP may contact her directly for additional f/u. Patient states she is scheduled to see PCP 02/20/22. Patient verbalizes understanding and is agreeable.   Surgery request sent.    Surgical Clearance request faxed to [phone].  Routing to [employer] for benefits

## 2022-02-08 ENCOUNTER — Telehealth: Payer: Self-pay | Admitting: Pharmacist

## 2022-02-08 NOTE — Chronic Care Management (AMB) (Signed)
Chronic Care Management Pharmacy Assistant   Name: [name]  MRN: [id number] DOB: 1940-09-18  Reason for Encounter: Medication Review / Medication Coordination Call and ER follow up call   Recent office visits:  None  Recent consult visits:  None  Hospital visits:  Patient was seen at [facility] ED on 01/30/2022 (1 hour) due to Epigastric pain.   New?Medications Started at [facility] Discharge:?? esomeprazole 40 MG capsule Medication Changes at Hospital Discharge: No medication changes Medications Discontinued at Hospital Discharge: No discontinued medications Medications that remain the same after Hospital Discharge:??  -All other medications will remain the same.    Medications: Outpatient Encounter Medications as of 02/08/2022  Medication Sig   amLODipine (NORVASC) 5 MG tablet Take 1 tablet (5 mg total) by mouth daily.   atorvastatin (LIPITOR) 20 MG tablet Take 1 tablet (20 mg total) by mouth daily.   esomeprazole (NEXIUM) 40 MG capsule Take 1 capsule (40 mg total) by mouth daily.   furosemide (LASIX) 20 MG tablet Take 1 tablet (20 mg total) by mouth daily.   levothyroxine (SYNTHROID) 50 MCG tablet Take 1 tablet (50 mcg total) by mouth daily before breakfast.   losartan-hydrochlorothiazide (HYZAAR) 100-12.5 MG tablet Take 1 tablet by mouth daily.   Melatonin 3 MG TBDP Take by mouth.   metoprolol tartrate (LOPRESSOR) 25 MG tablet Take 1 tablet (25 mg total) by mouth 2 (two) times daily.   ondansetron (ZOFRAN-ODT) 8 MG disintegrating tablet Take 1 tablet (8 mg total) by mouth every 8 (eight) hours as needed for nausea or vomiting.   potassium chloride SA (KLOR-CON M) 20 MEQ tablet Take 1 tablet (20 mEq total) by mouth 2 (two) times daily.   tamoxifen (NOLVADEX) 20 MG tablet Take 1 tablet (20 mg total) by mouth daily.   TURMERIC PO Take 500 mg by mouth daily.    Facility-Administered Encounter Medications as of 02/08/2022  Medication   betamethasone  acetate-betamethasone sodium phosphate (CELESTONE) injection 12 mg   Reviewed chart for medication changes ahead of medication coordination call.  BP Readings from Last 3 Encounters:  01/30/22 133/74  11/24/21 112/70  10/23/21 120/60    Lab Results  Component Value Date   HGBA1C 6.3 10/23/2021     Patient obtains medications through Adherence Packaging  30 Days    Last adherence delivery included: Womens 50+ MVI - take 1 tablet at breakfast  Tamoxifen 20 mg - take 1 tablet at breakfast Levothyroxine 50 mcg - take 1 tablet before breakfast Furosemide 20 mg  - take 1 tablet at breakfast Simvastatin 40 mg - take 1 tablet at bedtime Metoprolol Tartrate 25 mg - take 1 tablet at breakfast and  bedtime Amlodipine 5 mg - take 1 tablet at breakfast Losartan HCTZ 100/12.5 mg - take 1 tablet at breakfast   Patient declined last month:   Patient is due for next adherence delivery on: 02/20/2022   Called patient and reviewed medications and coordinated delivery.   This delivery to include: Womens 50+ MVI - take 1 tablet at bedtime Tamoxifen 20 mg - take 1 tablet at breakfast Levothyroxine 50 mcg - take 1 tablet before breakfast Furosemide 20 mg  - take 1 tablet at breakfast Atorvastatin 20 mg - take 1 tablet at bedtime Esomeprazole 40 mg - take 1 tablet at breakfast Metoprolol Tartrate 25 mg - take 1 tablet at breakfast and  bedtime Amlodipine 5 mg - take 1 tablet at breakfast Losartan HCTZ 100/12.5 mg - take 1 tablet  at breakfast   Patient will need a short fill: No short fills   Coordinated acute fill: No acute fills   Patient declined the following: No medications declined  Confirmed delivery date of 02/20/2022, advised patient that pharmacy will contact them the morning of delivery.  Notes: ER FOLLOW UP Spoke with patient, she states her epigastric pain cleared by the next morning. She continues to take esomeprazole and feels good.   Care Gaps: AWV - scheduled  03/19/2022 Last BP - 112/70 on 11/24/2021 Last A1C - 6.3 on 10/23/2021 AWV - never done Covid booster - overdue Shingrix - overdue Flu - due  Star Rating Drugs: Losartan HCTZ 100/12.5 mg - last filled 01/17/2022 30 DS at [facility] Atorvastatin 20 mg - no fill history  [facility] Pharmacist Assistant [phone]

## 2022-02-13 DIAGNOSIS — I10 Essential (primary) hypertension: Secondary | ICD-10-CM

## 2022-02-13 DIAGNOSIS — E785 Hyperlipidemia, unspecified: Secondary | ICD-10-CM | POA: Diagnosis not present

## 2022-02-13 DIAGNOSIS — E039 Hypothyroidism, unspecified: Secondary | ICD-10-CM | POA: Diagnosis not present

## 2022-02-15 ENCOUNTER — Encounter: Payer: Self-pay | Admitting: Oncology

## 2022-02-19 ENCOUNTER — Encounter: Payer: Self-pay | Admitting: Obstetrics and Gynecology

## 2022-02-19 ENCOUNTER — Ambulatory Visit: Payer: Medicare Other | Admitting: Obstetrics and Gynecology

## 2022-02-19 VITALS — BP 128/72 | HR 100 | Ht 61.0 in | Wt 150.0 lb

## 2022-02-19 DIAGNOSIS — R9389 Abnormal findings on diagnostic imaging of other specified body structures: Secondary | ICD-10-CM

## 2022-02-19 DIAGNOSIS — Z79810 Long term (current) use of selective estrogen receptor modulators (SERMs): Secondary | ICD-10-CM

## 2022-02-19 DIAGNOSIS — I10 Essential (primary) hypertension: Secondary | ICD-10-CM

## 2022-02-19 DIAGNOSIS — E038 Other specified hypothyroidism: Secondary | ICD-10-CM | POA: Diagnosis not present

## 2022-02-19 DIAGNOSIS — Z8679 Personal history of other diseases of the circulatory system: Secondary | ICD-10-CM | POA: Diagnosis not present

## 2022-02-19 DIAGNOSIS — N882 Stricture and stenosis of cervix uteri: Secondary | ICD-10-CM | POA: Diagnosis not present

## 2022-02-19 MED ORDER — MISOPROSTOL 200 MCG PO TABS: ORAL_TABLET | ORAL | 0 refills | Status: DC

## 2022-02-19 NOTE — Telephone Encounter (Signed)
Spoke with Hinton Dyer at [facility] Calling to f/u on Surgical clearance requested 02/07/22. Was advised to re fax request. Fax number confirmed.   Fax request sent.

## 2022-02-19 NOTE — H&P (View-Only) (Signed)
GYNECOLOGY  VISIT   HPI: 81 y.o.   Married White or Caucasian Not Hispanic or Latino  female   G0P0 with No LMP recorded. Patient is postmenopausal.   here for  pre op visit. The patient was noted to have a thickened endometrium on CT. H/O breast cancer, on tamoxifen, no bleeding.   U/S from 12/05/21 showed an endometrial stripe of 13.50 mm, cystic appearing, avascular.   GYNECOLOGIC HISTORY: No LMP recorded. Patient is postmenopausal. Contraception:pmp  Menopausal hormone therapy: none         OB History     Gravida  0   Para      Term      Preterm      AB      Living         SAB      IAB      Ectopic      Multiple      Live Births                 Patient Active Problem List   Diagnosis Date Noted   Cognitive impairment 10/04/2021   (HFpEF) heart failure with preserved ejection fraction ([place]) 10/25/2020   Fusion of spine of thoracolumbar region 02/21/2018   Chronic midline low back pain with bilateral sciatica 11/06/2016   Genetic testing 09/23/2015   Malignant neoplasm of upper-outer quadrant of left breast in female, estrogen receptor positive ([place]) 09/07/2015   Family history of breast cancer    Family history of colon cancer    Prediabetes 08/30/2014   Endometriosis    Osteopenia    Hypertension    UTI (lower urinary tract infection) 03/05/2011   Dysuria 03/05/2011   Hypothyroidism 06/06/2009   FATIGUE 06/06/2009   ELEVATED BLOOD PRESSURE 06/06/2009   Disorder of bone and cartilage 09/09/2008   SCOLIOSIS 09/09/2008   Hyperlipidemia 07/30/2008   Migraine headache 07/30/2008   GERD 07/30/2008   ENTHESOPATHY OF HIP REGION 07/30/2008   HEADACHE 07/30/2008    Past Medical History:  Diagnosis Date   Anemia    Anxiety    Arthritis    knees   Atrophic vaginitis    Cancer ([place])    breast cancer - left   Diverticulosis    Endometriosis    Family history of breast cancer    Family history of colon cancer    GERD (gastroesophageal reflux  disease)    Headache(784.0)    Heart murmur    as a child only   History of kidney stones    passed stones, no surgery required   History of radiation therapy 01/30/16- 03/02/16   Left Breast 50 Gy in 25 fractions.    Hyperlipidemia    Hypertension    Hypothyroidism    Internal hemorrhoids    Osteopenia    Osteoporosis    Personal history of chemotherapy    Personal history of radiation therapy    Rheumatic fever    age 39   Sleep apnea    " very mild" does not wear CPAP   Thyroid disease    Hypothyroid   Ulcer    UTI (lower urinary tract infection)     Past Surgical History:  Procedure Laterality Date   APPENDECTOMY     BREAST BIOPSY Left 08/19/2015   BREAST LUMPECTOMY Left 09/08/2015   BREAST LUMPECTOMY WITH RADIOACTIVE SEED AND SENTINEL LYMPH NODE BIOPSY Left 09/09/2015   Procedure: LEFT BREAST LUMPECTOMY WITH RADIOACTIVE SEED AND SENTINEL LYMPH NODE BIOPSY;  Surgeon: Jackolyn Confer, MD;  Location: [facility];  Service: General;  Laterality: Left;   COLONOSCOPY     DILATATION & CURETTAGE/HYSTEROSCOPY WITH MYOSURE N/A 10/09/2016   Procedure: [place];  Surgeon: Princess Bruins, MD;  Location: [facility];  Service: Gynecology;  Laterality: N/A;  requesting 7:30am in our block  requests one hour   ESOPHAGOGASTRODUODENOSCOPY ENDOSCOPY     IR GENERIC HISTORICAL  12/05/2015   IR CV LINE INJECTION 12/05/2015 [name], MD [facility]   LAPAROSCOPIC ENDOMETRIOSIS FULGURATION  1978   PELVIC LAPAROSCOPY     PORTACATH PLACEMENT N/A 10/04/2015   Procedure: INSERTION PORT-A-CATH WITH ULTRASOUND;  Surgeon: Jackolyn Confer, MD;  Location: [facility];  Service: General;  Laterality: N/A;   portacath removal     TONSILLECTOMY     ULNAR NERVE REPAIR  2010    Current Outpatient Medications  Medication Sig Dispense Refill   amLODipine (NORVASC) 5 MG tablet Take 1 tablet (5 mg total) by mouth daily. 90 tablet 1   atorvastatin (LIPITOR) 20 MG tablet Take  1 tablet (20 mg total) by mouth daily. 90 tablet 3   esomeprazole (NEXIUM) 40 MG capsule Take 1 capsule (40 mg total) by mouth daily. 90 capsule 0   furosemide (LASIX) 20 MG tablet Take 1 tablet (20 mg total) by mouth daily. 90 tablet 1   levothyroxine (SYNTHROID) 50 MCG tablet Take 1 tablet (50 mcg total) by mouth daily before breakfast. 90 tablet 1   losartan-hydrochlorothiazide (HYZAAR) 100-12.5 MG tablet Take 1 tablet by mouth daily. 90 tablet 1   Melatonin 3 MG TBDP Take by mouth.     metoprolol tartrate (LOPRESSOR) 25 MG tablet Take 1 tablet (25 mg total) by mouth 2 (two) times daily. 180 tablet 1   misoprostol (CYTOTEC) 200 MCG tablet Place 2 tablets vaginally the night prior to surgery 2 tablet 0   ondansetron (ZOFRAN-ODT) 8 MG disintegrating tablet Take 1 tablet (8 mg total) by mouth every 8 (eight) hours as needed for nausea or vomiting. 20 tablet 0   potassium chloride SA (KLOR-CON M) 20 MEQ tablet Take 1 tablet (20 mEq total) by mouth 2 (two) times daily. 10 tablet 0   tamoxifen (NOLVADEX) 20 MG tablet Take 1 tablet (20 mg total) by mouth daily. 30 tablet 12   TURMERIC PO Take 500 mg by mouth daily.      Current Facility-Administered Medications  Medication Dose Route Frequency Provider Last Rate Last Admin   betamethasone acetate-betamethasone sodium phosphate (CELESTONE) injection 12 mg  12 mg Other Once Magnus Sinning, MD         ALLERGIES: Nitrofurantoin and Sulfamethoxazole-trimethoprim  Family History  Problem Relation Age of Onset   Breast cancer Mother        Age 42   Hypertension Mother    Heart disease Father    Stroke Father    Colon cancer Father        dx 104s   Breast cancer Paternal Grandmother        Age 44   Diabetes Paternal Grandmother    Heart disease Maternal Grandmother     Social History   Socioeconomic History   Marital status: Married    Spouse name: Not on file   Number of children: 0   Years of education: Not on file   Highest  education level: Not on file  Occupational History   Occupation: retired  Tobacco Use   Smoking status: Never   Smokeless tobacco: Never  Vaping Use   Vaping Use: Never used  Substance and Sexual Activity   Alcohol use: No   Drug use: No   Sexual activity: Not Currently    Birth control/protection: Post-menopausal  Other Topics Concern   Not on file  Social History Narrative   Retired Oncologist.    Social Determinants of Health   Financial Resource Strain: Low Risk  (01/22/2022)   Overall Financial Resource Strain (CARDIA)    Difficulty of Paying Living Expenses: Not very hard  Food Insecurity: No Food Insecurity (03/17/2021)   Hunger Vital Sign    Worried About Running Out of Food in the Last Year: Never true    Ran Out of Food in the Last Year: Never true  Transportation Needs: No Transportation Needs (03/17/2021)   PRAPARE - Hydrologist (Medical): No    Lack of Transportation (Non-Medical): No  Physical Activity: Insufficiently Active (03/17/2021)   Exercise Vital Sign    Days of Exercise per Week: 3 days    Minutes of Exercise per Session: 10 min  Stress: No Stress Concern Present (03/17/2021)   [place]    Feeling of Stress : Not at all  Social Connections: [place] (03/17/2021)   Social Connection and Isolation Panel [NHANES]    Frequency of Communication with Friends and Family: More than three times a week    Frequency of Social Gatherings with Friends and Family: More than three times a week    Attends Religious Services: More than 4 times per year    Active Member of [employer] or Organizations: Yes    Attends Archivist Meetings: More than 4 times per year    Marital Status: Married  Human resources officer Violence: Not At Risk (03/17/2021)   Humiliation, Afraid, Rape, and Kick questionnaire    Fear of Current or Ex-Partner: No    Emotionally  Abused: No    Physically Abused: No    Sexually Abused: No    Review of Systems  All other systems reviewed and are negative.   PHYSICAL EXAMINATION:    BP 128/72   Pulse 100   Ht '5\' 1"'$  (1.549 m)   Wt 150 lb (68 kg)   SpO2 99%   BMI 28.34 kg/m     General appearance: alert, cooperative and appears stated age Neck: no adenopathy, supple, symmetrical, trachea midline and thyroid normal to inspection and palpation Heart: regular rate and rhythm Lungs: CTAB Abdomen: soft, non-tender; bowel sounds normal; no masses,  no organomegaly Extremities: normal, atraumatic, no cyanosis Skin: normal color, texture and turgor, no rashes or lesions Lymph: normal cervical supraclavicular and inguinal nodes Neurologic: grossly normal  1. Thickened endometrium Plan: hysteroscopy, dilation and curettage. Reviewed risks, including: bleeding, infection, uterine perforation, fluid overload, need for further surgery Will pretreat with cytotec  2. Cervical stenosis (uterine cervix) - misoprostol (CYTOTEC) 200 MCG tablet; Place 2 tablets vaginally the night prior to surgery  Dispense: 2 tablet; Refill: 0  3. Use of tamoxifen (Nolvadex)  4. Hypertension, unspecified type Controlled on medication  5. Other specified hypothyroidism Controlled on medication.   6. History of heart disease H/O heart failure with preserved ejection fraction.  Preoperative clearance has been requested.

## 2022-02-19 NOTE — Progress Notes (Signed)
GYNECOLOGY  VISIT   HPI: 81 y.o.   Married White or Caucasian Not Hispanic or Latino  female   G0P0 with No LMP recorded. Patient is postmenopausal.   here for  pre op visit. The patient was noted to have a thickened endometrium on CT. H/O breast cancer, on tamoxifen, no bleeding.   U/S from 12/05/21 showed an endometrial stripe of 13.50 mm, cystic appearing, avascular.   GYNECOLOGIC HISTORY: No LMP recorded. Patient is postmenopausal. Contraception:pmp  Menopausal hormone therapy: none         OB History     Gravida  0   Para      Term      Preterm      AB      Living         SAB      IAB      Ectopic      Multiple      Live Births                 Patient Active Problem List   Diagnosis Date Noted   Cognitive impairment 10/04/2021   (HFpEF) heart failure with preserved ejection fraction ([place]) 10/25/2020   Fusion of spine of thoracolumbar region 02/21/2018   Chronic midline low back pain with bilateral sciatica 11/06/2016   Genetic testing 09/23/2015   Malignant neoplasm of upper-outer quadrant of left breast in female, estrogen receptor positive ([place]) 09/07/2015   Family history of breast cancer    Family history of colon cancer    Prediabetes 08/30/2014   Endometriosis    Osteopenia    Hypertension    UTI (lower urinary tract infection) 03/05/2011   Dysuria 03/05/2011   Hypothyroidism 06/06/2009   FATIGUE 06/06/2009   ELEVATED BLOOD PRESSURE 06/06/2009   Disorder of bone and cartilage 09/09/2008   SCOLIOSIS 09/09/2008   Hyperlipidemia 07/30/2008   Migraine headache 07/30/2008   GERD 07/30/2008   ENTHESOPATHY OF HIP REGION 07/30/2008   HEADACHE 07/30/2008    Past Medical History:  Diagnosis Date   Anemia    Anxiety    Arthritis    knees   Atrophic vaginitis    Cancer ([place])    breast cancer - left   Diverticulosis    Endometriosis    Family history of breast cancer    Family history of colon cancer    GERD (gastroesophageal reflux  disease)    Headache(784.0)    Heart murmur    as a child only   History of kidney stones    passed stones, no surgery required   History of radiation therapy 01/30/16- 03/02/16   Left Breast 50 Gy in 25 fractions.    Hyperlipidemia    Hypertension    Hypothyroidism    Internal hemorrhoids    Osteopenia    Osteoporosis    Personal history of chemotherapy    Personal history of radiation therapy    Rheumatic fever    age 51   Sleep apnea    " very mild" does not wear CPAP   Thyroid disease    Hypothyroid   Ulcer    UTI (lower urinary tract infection)     Past Surgical History:  Procedure Laterality Date   APPENDECTOMY     BREAST BIOPSY Left 08/19/2015   BREAST LUMPECTOMY Left 09/08/2015   BREAST LUMPECTOMY WITH RADIOACTIVE SEED AND SENTINEL LYMPH NODE BIOPSY Left 09/09/2015   Procedure: LEFT BREAST LUMPECTOMY WITH RADIOACTIVE SEED AND SENTINEL LYMPH NODE BIOPSY;  Surgeon: Jackolyn Confer, MD;  Location: [facility];  Service: General;  Laterality: Left;   COLONOSCOPY     DILATATION & CURETTAGE/HYSTEROSCOPY WITH MYOSURE N/A 10/09/2016   Procedure: [place];  Surgeon: Princess Bruins, MD;  Location: [facility];  Service: Gynecology;  Laterality: N/A;  requesting 7:30am in our block  requests one hour   ESOPHAGOGASTRODUODENOSCOPY ENDOSCOPY     IR GENERIC HISTORICAL  12/05/2015   IR CV LINE INJECTION 12/05/2015 [name], MD [facility]   LAPAROSCOPIC ENDOMETRIOSIS FULGURATION  1978   PELVIC LAPAROSCOPY     PORTACATH PLACEMENT N/A 10/04/2015   Procedure: INSERTION PORT-A-CATH WITH ULTRASOUND;  Surgeon: Jackolyn Confer, MD;  Location: [facility];  Service: General;  Laterality: N/A;   portacath removal     TONSILLECTOMY     ULNAR NERVE REPAIR  2010    Current Outpatient Medications  Medication Sig Dispense Refill   amLODipine (NORVASC) 5 MG tablet Take 1 tablet (5 mg total) by mouth daily. 90 tablet 1   atorvastatin (LIPITOR) 20 MG tablet Take  1 tablet (20 mg total) by mouth daily. 90 tablet 3   esomeprazole (NEXIUM) 40 MG capsule Take 1 capsule (40 mg total) by mouth daily. 90 capsule 0   furosemide (LASIX) 20 MG tablet Take 1 tablet (20 mg total) by mouth daily. 90 tablet 1   levothyroxine (SYNTHROID) 50 MCG tablet Take 1 tablet (50 mcg total) by mouth daily before breakfast. 90 tablet 1   losartan-hydrochlorothiazide (HYZAAR) 100-12.5 MG tablet Take 1 tablet by mouth daily. 90 tablet 1   Melatonin 3 MG TBDP Take by mouth.     metoprolol tartrate (LOPRESSOR) 25 MG tablet Take 1 tablet (25 mg total) by mouth 2 (two) times daily. 180 tablet 1   misoprostol (CYTOTEC) 200 MCG tablet Place 2 tablets vaginally the night prior to surgery 2 tablet 0   ondansetron (ZOFRAN-ODT) 8 MG disintegrating tablet Take 1 tablet (8 mg total) by mouth every 8 (eight) hours as needed for nausea or vomiting. 20 tablet 0   potassium chloride SA (KLOR-CON M) 20 MEQ tablet Take 1 tablet (20 mEq total) by mouth 2 (two) times daily. 10 tablet 0   tamoxifen (NOLVADEX) 20 MG tablet Take 1 tablet (20 mg total) by mouth daily. 30 tablet 12   TURMERIC PO Take 500 mg by mouth daily.      Current Facility-Administered Medications  Medication Dose Route Frequency Provider Last Rate Last Admin   betamethasone acetate-betamethasone sodium phosphate (CELESTONE) injection 12 mg  12 mg Other Once Magnus Sinning, MD         ALLERGIES: Nitrofurantoin and Sulfamethoxazole-trimethoprim  Family History  Problem Relation Age of Onset   Breast cancer Mother        Age 16   Hypertension Mother    Heart disease Father    Stroke Father    Colon cancer Father        dx 53s   Breast cancer Paternal Grandmother        Age 42   Diabetes Paternal Grandmother    Heart disease Maternal Grandmother     Social History   Socioeconomic History   Marital status: Married    Spouse name: Not on file   Number of children: 0   Years of education: Not on file   Highest  education level: Not on file  Occupational History   Occupation: retired  Tobacco Use   Smoking status: Never   Smokeless tobacco: Never  Vaping Use   Vaping Use: Never used  Substance and Sexual Activity   Alcohol use: No   Drug use: No   Sexual activity: Not Currently    Birth control/protection: Post-menopausal  Other Topics Concern   Not on file  Social History Narrative   Retired Oncologist.    Social Determinants of Health   Financial Resource Strain: Low Risk  (01/22/2022)   Overall Financial Resource Strain (CARDIA)    Difficulty of Paying Living Expenses: Not very hard  Food Insecurity: No Food Insecurity (03/17/2021)   Hunger Vital Sign    Worried About Running Out of Food in the Last Year: Never true    Ran Out of Food in the Last Year: Never true  Transportation Needs: No Transportation Needs (03/17/2021)   PRAPARE - Hydrologist (Medical): No    Lack of Transportation (Non-Medical): No  Physical Activity: Insufficiently Active (03/17/2021)   Exercise Vital Sign    Days of Exercise per Week: 3 days    Minutes of Exercise per Session: 10 min  Stress: No Stress Concern Present (03/17/2021)   [place]    Feeling of Stress : Not at all  Social Connections: [place] (03/17/2021)   Social Connection and Isolation Panel [NHANES]    Frequency of Communication with Friends and Family: More than three times a week    Frequency of Social Gatherings with Friends and Family: More than three times a week    Attends Religious Services: More than 4 times per year    Active Member of [employer] or Organizations: Yes    Attends Archivist Meetings: More than 4 times per year    Marital Status: Married  Human resources officer Violence: Not At Risk (03/17/2021)   Humiliation, Afraid, Rape, and Kick questionnaire    Fear of Current or Ex-Partner: No    Emotionally  Abused: No    Physically Abused: No    Sexually Abused: No    Review of Systems  All other systems reviewed and are negative.   PHYSICAL EXAMINATION:    BP 128/72   Pulse 100   Ht '5\' 1"'$  (1.549 m)   Wt 150 lb (68 kg)   SpO2 99%   BMI 28.34 kg/m     General appearance: alert, cooperative and appears stated age Neck: no adenopathy, supple, symmetrical, trachea midline and thyroid normal to inspection and palpation Heart: regular rate and rhythm Lungs: CTAB Abdomen: soft, non-tender; bowel sounds normal; no masses,  no organomegaly Extremities: normal, atraumatic, no cyanosis Skin: normal color, texture and turgor, no rashes or lesions Lymph: normal cervical supraclavicular and inguinal nodes Neurologic: grossly normal  1. Thickened endometrium Plan: hysteroscopy, dilation and curettage. Reviewed risks, including: bleeding, infection, uterine perforation, fluid overload, need for further surgery Will pretreat with cytotec  2. Cervical stenosis (uterine cervix) - misoprostol (CYTOTEC) 200 MCG tablet; Place 2 tablets vaginally the night prior to surgery  Dispense: 2 tablet; Refill: 0  3. Use of tamoxifen (Nolvadex)  4. Hypertension, unspecified type Controlled on medication  5. Other specified hypothyroidism Controlled on medication.   6. History of heart disease H/O heart failure with preserved ejection fraction.  Preoperative clearance has been requested.

## 2022-02-20 ENCOUNTER — Encounter: Payer: Medicare Other | Admitting: Family Medicine

## 2022-02-20 NOTE — Telephone Encounter (Signed)
Call placed to patients home number x2, call was picked up and disconnected both times.   Called mobile number, Left message for spouse or patient to call Sharee Pimple, RN at [facility], [phone], OPT 5 regarding surgery scheduled for next week. .    Routing to Dr. Rosann Auerbach.

## 2022-02-20 NOTE — Telephone Encounter (Signed)
Spoke with Hinton Dyer at [facility] Confirmed surgical clearance request received on 02/19/22, has been forwarded to care team to review. Requested request to be reviewed as soon as possible, surgery scheduled for 02/27/22.

## 2022-02-20 NOTE — Telephone Encounter (Signed)
Call placed to patient to f/u and review pre-op instructions. Patient states she is unaware of surgery date of 11/14 as previously discussed. Patient states her husband typically schedules her appointments. I asked when her husband would be available to review pre-op instructions, patient states after 1pm today. Advised patient I will return call after 4:30pm today to review pre-op instructions with her and her husband. Advised patient she will likely need to reschedule her PCP OV scheduled for 02/27/22, surgery is scheduled for this day at 11:20am. Advised patient that surgical clearance has been requested from PCP, this is pending and she may need earlier PCP OV.

## 2022-02-21 ENCOUNTER — Encounter (HOSPITAL_BASED_OUTPATIENT_CLINIC_OR_DEPARTMENT_OTHER): Payer: Self-pay | Admitting: Obstetrics and Gynecology

## 2022-02-21 NOTE — Progress Notes (Signed)
Spoke w/ via phone for pre-op interview--- [employer]---- ISTAT              Lab results------ Current EKG dated 01/30/22 in [place] test -----patient states asymptomatic no test needed Arrive at -------0630 NPO after MN NO Solid Food.   Med rec completed Medications to take morning of surgery ----- Norvasc, Atenolol and Levothyroxine Diabetic medication ----- Patient instructed no nail polish to be worn day of surgery Patient instructed to bring photo id and insurance card day of surgery Patient aware to have Driver (ride ) / caregiver  Husband [name]  for 24 hours after surgery  Patient Special Instructions ----- Pre-Op special Istructions ----- Patient verbalized understanding of instructions that were given at this phone interview. Patient denies shortness of breath, chest pain, fever, cough at this phone interview.

## 2022-02-22 ENCOUNTER — Other Ambulatory Visit: Payer: Self-pay | Admitting: Family Medicine

## 2022-02-22 DIAGNOSIS — I10 Essential (primary) hypertension: Secondary | ICD-10-CM

## 2022-02-22 DIAGNOSIS — E785 Hyperlipidemia, unspecified: Secondary | ICD-10-CM

## 2022-02-23 NOTE — Telephone Encounter (Signed)
Surgical Clearance not received to date. Requested x2.   Call placed to PCP/ Dr. Elease Hashimoto, spoke with Joycelyn Schmid, was advised she was unable to speak with clinical staff regarding clearance, will have someone return call. Advised patient is scheduled for 02/27/22, will need to cancel surgery today if clearance not received.    Call placed to patient. Patient states spouse not available. Reviewed Pre-op instructions. Patient wrote down instructions and read back as we reviewed them. Surgery at [facility] on 02/27/22 at 1120, arrive at 0920.   Advised patient surgical clearance still pending. If surgical clearance not received today, will need to reschedule surgery. Advised patient I have contacted PCP, will provide update to patient by 12pm today. Patient read back and confirmed.

## 2022-02-23 NOTE — Telephone Encounter (Signed)
Clearance not received from PCP.  Reviewed with Dr. Talbert Nan.   Call returned to patient. Advised surgery will be cancelled for 02/27/22, reviewed alternative dates. Will reschedule for 03/19/22. Advised patient I will return call on Monday afternoon to review with her and her husband.    Call placed to central scheduling, spoke with Maudie Mercury. Case rescheduled to 03/19/22 at 0730.     Per review of EPIC, patient is scheduled to see PCP 03/05/22.   Routing to Dr. Rosann Auerbach.

## 2022-02-26 ENCOUNTER — Telehealth: Payer: Self-pay

## 2022-02-26 ENCOUNTER — Encounter: Payer: Self-pay | Admitting: Family Medicine

## 2022-02-26 NOTE — Progress Notes (Signed)
  Chronic Care Management   Note  02/26/2022 Name: [name] MRN: [id number] DOB: 07-17-1940  [name] is a 81 y.o. year old female who is a primary care patient of Burchette, Alinda Sierras, MD. I reached out to [place] by phone today in response to a referral sent by [name] PCP.  [name]  agreedto scheduling an appointment with the CCM RN Case Manager   Follow up plan: Patient agreed to scheduled appointment with RN Case Manager on 03/14/2022(date/time).   [name], [place] [address] Direct Dial: [phone] [name]

## 2022-02-26 NOTE — Telephone Encounter (Signed)
PCP Clearance received. See letter dated 02/26/22 in EPIC by Dr. Elease Hashimoto.   Spoke with patients spouse, Marcello Moores, ok per dpr.  Surgery date request confirmed.  Advised surgery is scheduled for 03/19/22, [place] at 53. Surgery instruction sheet and hospital brochure reviewed, printed copy will be mailed.  Spouse verbalizes understanding and is agreeable.     Routing to Dr. Rosann Auerbach.   Cc: [place] -note surgery date change.

## 2022-02-27 ENCOUNTER — Encounter: Payer: Medicare Other | Admitting: Family Medicine

## 2022-02-27 DIAGNOSIS — I10 Essential (primary) hypertension: Secondary | ICD-10-CM

## 2022-03-05 ENCOUNTER — Ambulatory Visit (INDEPENDENT_AMBULATORY_CARE_PROVIDER_SITE_OTHER): Payer: Medicare Other | Admitting: Family Medicine

## 2022-03-05 ENCOUNTER — Encounter: Payer: Self-pay | Admitting: Family Medicine

## 2022-03-05 ENCOUNTER — Encounter: Payer: Self-pay | Admitting: Oncology

## 2022-03-05 VITALS — BP 138/60 | HR 94 | Temp 97.9°F | Ht 60.63 in | Wt 147.2 lb

## 2022-03-05 DIAGNOSIS — R7303 Prediabetes: Secondary | ICD-10-CM

## 2022-03-05 DIAGNOSIS — Z0000 Encounter for general adult medical examination without abnormal findings: Secondary | ICD-10-CM | POA: Diagnosis not present

## 2022-03-05 DIAGNOSIS — Z23 Encounter for immunization: Secondary | ICD-10-CM | POA: Diagnosis not present

## 2022-03-05 DIAGNOSIS — E785 Hyperlipidemia, unspecified: Secondary | ICD-10-CM | POA: Diagnosis not present

## 2022-03-05 DIAGNOSIS — Z01818 Encounter for other preprocedural examination: Secondary | ICD-10-CM | POA: Diagnosis not present

## 2022-03-05 LAB — CBC WITH DIFFERENTIAL/PLATELET
Basophils Absolute: 0.1 10*3/uL (ref 0.0–0.1)
Basophils Relative: 0.9 % (ref 0.0–3.0)
Eosinophils Absolute: 0.3 10*3/uL (ref 0.0–0.7)
Eosinophils Relative: 4.1 % (ref 0.0–5.0)
HCT: 38.9 % (ref 36.0–46.0)
Hemoglobin: 12.9 g/dL (ref 12.0–15.0)
Lymphocytes Relative: 27.1 % (ref 12.0–46.0)
Lymphs Abs: 1.9 10*3/uL (ref 0.7–4.0)
MCHC: 33.2 g/dL (ref 30.0–36.0)
MCV: 81.3 fl (ref 78.0–100.0)
Monocytes Absolute: 0.8 10*3/uL (ref 0.1–1.0)
Monocytes Relative: 12 % (ref 3.0–12.0)
Neutro Abs: 3.9 10*3/uL (ref 1.4–7.7)
Neutrophils Relative %: 55.9 % (ref 43.0–77.0)
Platelets: 268 10*3/uL (ref 150.0–400.0)
RBC: 4.78 Mil/uL (ref 3.87–5.11)
RDW: 14.7 % (ref 11.5–15.5)
WBC: 6.9 10*3/uL (ref 4.0–10.5)

## 2022-03-05 LAB — POCT GLYCOSYLATED HEMOGLOBIN (HGB A1C): Hemoglobin A1C: 5.8 % — AB (ref 4.0–5.6)

## 2022-03-05 LAB — BASIC METABOLIC PANEL
BUN: 23 mg/dL (ref 6–23)
CO2: 26 mEq/L (ref 19–32)
Calcium: 10.1 mg/dL (ref 8.4–10.5)
Chloride: 105 mEq/L (ref 96–112)
Creatinine, Ser: 1.03 mg/dL (ref 0.40–1.20)
GFR: 50.88 mL/min — ABNORMAL LOW (ref >60.00)
Glucose, Bld: 87 mg/dL (ref 70–99)
Potassium: 4.3 mEq/L (ref 3.5–5.1)
Sodium: 141 mEq/L (ref 135–145)

## 2022-03-05 LAB — PROTIME-INR
INR: 1 ratio (ref 0.8–1.0)
Prothrombin Time: 10.8 s (ref 9.6–13.1)

## 2022-03-05 NOTE — Patient Instructions (Addendum)
Set up repeat Mammogram  Let's set up 6 month follow up.     A1C has improved to 5.8%

## 2022-03-05 NOTE — Progress Notes (Unsigned)
Established Patient Office Visit  Subjective   Patient ID: [name], female    DOB: August 06, 1940  Age: 81 y.o. MRN: [id number]  No chief complaint on file.   HPI  {History (Optional):23778} Jenilee is here for physical exam and also requesting clearance letter for proposed hysteroscopy D&C with GYN.  She had recent ultrasound with thickened endometrium-which was initially noted on CT scan.  She denies any recent vaginal spotting.  She does have some cognitive impairment is accompanied today by her husband.   Her past medical history significant for migraine headaches, hypertension, GERD, hypothyroidism, osteopenia, hyperlipidemia, endometriosis, prediabetes, breast cancer.  She seemed unsure of medications but is supposed to be taking amlodipine, atorvastatin, Nexium, levothyroxine, losartan HCTZ, metoprolol, and tamoxifen  Health maintenance reviewed  -Needs flu vaccine today -Pneumonia vaccines complete -Apparently had at least 1 Shingrix since could not confirm whether she has had second. -Has not apparently had mammogram since 2021  Social history-married.  Never had any children of her own but 1 adopted son.  No grandchildren.  Never smoked.  No alcohol.  Family history-mother had breast cancer.  Father had history of CAD and colon cancer.  2 brothers without active medical problems according to patient  Past Medical History:  Diagnosis Date   Anemia    Anxiety    Arthritis    knees   Atrophic vaginitis    Cancer ([place])    breast cancer - left   Diverticulosis    Endometriosis    Family history of breast cancer    Family history of colon cancer    GERD (gastroesophageal reflux disease)    Headache(784.0)    Heart murmur    as a child only   History of kidney stones    passed stones, no surgery required   History of radiation therapy 01/30/16- 03/02/16   Left Breast 50 Gy in 25 fractions.    Hyperlipidemia    Hypertension    Hypothyroidism    Internal  hemorrhoids    Osteopenia    Osteoporosis    Personal history of chemotherapy    Personal history of radiation therapy    Rheumatic fever    age 24   Sleep apnea    " very mild" does not wear CPAP   Thyroid disease    Hypothyroid   Ulcer    UTI (lower urinary tract infection)    Past Surgical History:  Procedure Laterality Date   APPENDECTOMY     BREAST BIOPSY Left 08/19/2015   BREAST LUMPECTOMY Left 09/08/2015   BREAST LUMPECTOMY WITH RADIOACTIVE SEED AND SENTINEL LYMPH NODE BIOPSY Left 09/09/2015   Procedure: LEFT BREAST LUMPECTOMY WITH RADIOACTIVE SEED AND SENTINEL LYMPH NODE BIOPSY;  Surgeon: Jackolyn Confer, MD;  Location: [facility];  Service: General;  Laterality: Left;   COLONOSCOPY     DILATATION & CURETTAGE/HYSTEROSCOPY WITH MYOSURE N/A 10/09/2016   Procedure: [place];  Surgeon: Princess Bruins, MD;  Location: [facility];  Service: Gynecology;  Laterality: N/A;  requesting 7:30am in our block  requests one hour   ESOPHAGOGASTRODUODENOSCOPY ENDOSCOPY     IR GENERIC HISTORICAL  12/05/2015   IR CV LINE INJECTION 12/05/2015 [name], MD [facility]   LAPAROSCOPIC ENDOMETRIOSIS FULGURATION  1978   PELVIC LAPAROSCOPY     PORTACATH PLACEMENT N/A 10/04/2015   Procedure: INSERTION PORT-A-CATH WITH ULTRASOUND;  Surgeon: Jackolyn Confer, MD;  Location: [facility];  Service: General;  Laterality: N/A;   portacath removal  TONSILLECTOMY     ULNAR NERVE REPAIR  2010    reports that she has never smoked. She has never used smokeless tobacco. She reports that she does not drink alcohol and does not use drugs. family history includes Breast cancer in her mother and paternal grandmother; Colon cancer in her father; Diabetes in her paternal grandmother; Heart disease in her father and maternal grandmother; Hypertension in her mother; Stroke in her father. Allergies  Allergen Reactions   Nitrofurantoin Nausea And Vomiting    GI upset    Sulfamethoxazole-Trimethoprim Nausea And Vomiting and Other (See Comments)    GI upset    Review of Systems  Constitutional:  Negative for malaise/fatigue.  Eyes:  Negative for blurred vision.  Respiratory:  Negative for shortness of breath.   Cardiovascular:  Negative for chest pain.  Neurological:  Negative for dizziness, weakness and headaches.      Objective:     BP 138/60 (BP Location: Right Arm, Cuff Size: Normal)   Pulse 94   Temp 97.9 F (36.6 C) (Oral)   Ht 5' 0.63" (1.54 m)   Wt 147 lb 3.2 oz (66.8 kg)   SpO2 97%   BMI 28.15 kg/m  {Vitals History (Optional):23777}  Physical Exam Vitals reviewed.  Constitutional:      Appearance: She is well-developed.  Eyes:     Pupils: Pupils are equal, round, and reactive to light.  Neck:     Thyroid: No thyromegaly.     Vascular: No JVD.  Cardiovascular:     Rate and Rhythm: Normal rate and regular rhythm.     Heart sounds:     No gallop.  Pulmonary:     Effort: Pulmonary effort is normal. No respiratory distress.     Breath sounds: Normal breath sounds. No wheezing or rales.  Musculoskeletal:     Cervical back: Neck supple.     Right lower leg: No edema.     Left lower leg: No edema.  Neurological:     Mental Status: She is alert.      Results for orders placed or performed in visit on 03/05/22  Protime-INR  Result Value Ref Range   INR 1.0 0.8 - 1.0 ratio   Prothrombin Time 10.8 9.6 - 13.1 sec  Basic metabolic panel  Result Value Ref Range   Sodium 141 135 - 145 mEq/L   Potassium 4.3 3.5 - 5.1 mEq/L   Chloride 105 96 - 112 mEq/L   CO2 26 19 - 32 mEq/L   Glucose, Bld 87 70 - 99 mg/dL   BUN 23 6 - 23 mg/dL   Creatinine, Ser 1.03 0.40 - 1.20 mg/dL   GFR 50.88 (L) >60.00 mL/min   Calcium 10.1 8.4 - 10.5 mg/dL  CBC with Differential/Platelet  Result Value Ref Range   WBC 6.9 4.0 - 10.5 K/uL   RBC 4.78 3.87 - 5.11 Mil/uL   Hemoglobin 12.9 12.0 - 15.0 g/dL   HCT 38.9 36.0 - 46.0 %   MCV 81.3 78.0 -  100.0 fl   MCHC 33.2 30.0 - 36.0 g/dL   RDW 14.7 11.5 - 15.5 %   Platelets 268.0 150.0 - 400.0 K/uL   Neutrophils Relative % 55.9 43.0 - 77.0 %   Lymphocytes Relative 27.1 12.0 - 46.0 %   Monocytes Relative 12.0 3.0 - 12.0 %   Eosinophils Relative 4.1 0.0 - 5.0 %   Basophils Relative 0.9 0.0 - 3.0 %   Neutro Abs 3.9 1.4 - 7.7 K/uL  Lymphs Abs 1.9 0.7 - 4.0 K/uL   Monocytes Absolute 0.8 0.1 - 1.0 K/uL   Eosinophils Absolute 0.3 0.0 - 0.7 K/uL   Basophils Absolute 0.1 0.0 - 0.1 K/uL  POC HgB A1c  Result Value Ref Range   Hemoglobin A1C 5.8 (A) 4.0 - 5.6 %   HbA1c POC (<> result, manual entry)     HbA1c, POC (prediabetic range)     HbA1c, POC (controlled diabetic range)      {Labs (Optional):23779}  The ASCVD Risk score (Arnett DK, et al., 2019) failed to calculate for the following reasons:   The 2019 ASCVD risk score is only valid for ages 78 to 41    Assessment & Plan:    Patient is here for physical exam and preoperative clearance for D&C hysteroscopy procedure per GYN.  She does have history of prediabetes range blood sugars.  Does not monitor sugars regularly.  No cardiac history other than heart failure with preserved ejection fraction.  No recent edema.  No dyspnea.  Sedentary lifestyle.  -Needs follow-up mammogram and she will set this up -Flu vaccine given -Check further labs including A1c, CBC, basic metabolic panel -She had recent EKG just a month ago in October which was reviewed and unremarkable  We will get together letter regarding clearance for surgery  No follow-ups on file.    [name], MD

## 2022-03-06 NOTE — Telephone Encounter (Signed)
See Surgical clearance in EPIC from PCP/ Dr. Elease Hashimoto dated 03/05/22.

## 2022-03-07 ENCOUNTER — Encounter: Payer: Medicare Other | Admitting: Obstetrics and Gynecology

## 2022-03-08 ENCOUNTER — Other Ambulatory Visit: Payer: Self-pay | Admitting: Family Medicine

## 2022-03-08 DIAGNOSIS — I5033 Acute on chronic diastolic (congestive) heart failure: Secondary | ICD-10-CM

## 2022-03-12 ENCOUNTER — Telehealth: Payer: Self-pay | Admitting: Pharmacist

## 2022-03-12 NOTE — Chronic Care Management (AMB) (Signed)
Chronic Care Management Pharmacy Assistant   Name: [name]  MRN: [id number] DOB: May 30, 1940  Reason for Encounter: Medication Review / Medication Coordination Call   Recent office visits:  03/05/2022 Stephanie Littler MD - Patient was seen for physical exam and additional concerns. Discontinued Atenolol, Ondansetron and Simvastatin. Follow up in 6 months.   Recent consult visits:  02/19/2022 Stephanie Stagers MD (OBGYN) - Patient was seen for thickented endometrium and additional concerns. Started Misoprostol 200 mcg prior to surgery.   Hospital visits:  None  Medications: Outpatient Encounter Medications as of 03/12/2022  Medication Sig   amLODipine (NORVASC) 5 MG tablet Take 1 tablet (5 mg total) by mouth daily.   atorvastatin (LIPITOR) 20 MG tablet Take 1 tablet (20 mg total) by mouth daily.   esomeprazole (NEXIUM) 40 MG capsule Take 1 capsule (40 mg total) by mouth daily.   furosemide (LASIX) 20 MG tablet Take 1 tablet (20 mg total) by mouth daily.   HYDROcodone-acetaminophen (NORCO/VICODIN) 5-325 MG tablet Take 1 tablet by mouth every 6 (six) hours as needed for moderate pain.   levothyroxine (SYNTHROID) 50 MCG tablet Take 1 tablet (50 mcg total) by mouth daily before breakfast.   losartan-hydrochlorothiazide (HYZAAR) 100-12.5 MG tablet Take 1 tablet by mouth daily.   Melatonin 3 MG TBDP Take by mouth.   metoprolol tartrate (LOPRESSOR) 25 MG tablet Take 1 tablet (25 mg total) by mouth 2 (two) times daily.   misoprostol (CYTOTEC) 200 MCG tablet Place 2 tablets vaginally the night prior to surgery   omeprazole (PRILOSEC) 40 MG capsule Take 40 mg by mouth daily.   potassium chloride SA (KLOR-CON M) 20 MEQ tablet Take 1 tablet (20 mEq total) by mouth 2 (two) times daily.   tamoxifen (NOLVADEX) 20 MG tablet Take 1 tablet (20 mg total) by mouth daily.   traZODone (DESYREL) 50 MG tablet Take 50 mg by mouth at bedtime.   TURMERIC PO Take 500 mg by mouth daily.     Facility-Administered Encounter Medications as of 03/12/2022  Medication   betamethasone acetate-betamethasone sodium phosphate (CELESTONE) injection 12 mg   Reviewed chart for medication changes ahead of medication coordination call.  No OVs, Consults, or hospital visits since last care coordination call/Pharmacist visit. (If appropriate, list visit date, provider name)  No medication changes indicated OR if recent visit, treatment plan here.  BP Readings from Last 3 Encounters:  03/05/22 138/60  02/19/22 128/72  01/30/22 133/74    Lab Results  Component Value Date   HGBA1C 5.8 (A) 03/05/2022     Patient obtains medications through Adherence Packaging  30 Days    Last adherence delivery included: Womens 50+ MVI - take 1 tablet at bedtime Tamoxifen 20 mg - take 1 tablet at breakfast Levothyroxine 50 mcg - take 1 tablet before breakfast Furosemide 20 mg  - take 1 tablet at breakfast Atorvastatin 20 mg - take 1 tablet at bedtime Esomeprazole 40 mg - take 1 tablet at breakfast Metoprolol Tartrate 25 mg - take 1 tablet at breakfast and  bedtime Amlodipine 5 mg - take 1 tablet at breakfast Losartan HCTZ 100/12.5 mg - take 1 tablet at breakfast   Patient declined last month: No medications declined   Patient is due for next adherence delivery on: 03/22/2022   Called patient and reviewed medications and coordinated delivery.   This delivery to include: Womens 50+ MVI - take 1 tablet at bedtime Tamoxifen 20 mg - take 1 tablet at breakfast Levothyroxine 50 mcg -  take 1 tablet before breakfast Furosemide 20 mg  - take 1 tablet at breakfast Atorvastatin 20 mg - take 1 tablet at bedtime Esomeprazole 40 mg - take 1 tablet at breakfast Metoprolol Tartrate 25 mg - take 1 tablet at breakfast and  bedtime Amlodipine 5 mg - take 1 tablet at breakfast Losartan HCTZ 100/12.5 mg - take 1 tablet at breakfast   Patient will need a short fill: No short fills   Coordinated acute fill:  No acute fills   Patient declined the following: No medications declined   Confirmed delivery date of 03/22/2022, advised patient that pharmacy will contact them the morning of delivery.  Care Gaps: AWV - scheduled 03/19/2022 Last BP - 138/60 on 03/05/2022 Last A1C - 5.8 on 03/05/2022 Shingrix - overdue Covid - overdue AWV - due soon  Star Rating Drugs: Losartan HCTZ 100/12.5 mg - last filled 02/16/2022 30 DS at [facility] Atorvastatin 20 mg - last filled 02/16/2022 30 DS at [facility] [phone]

## 2022-03-13 ENCOUNTER — Encounter (HOSPITAL_BASED_OUTPATIENT_CLINIC_OR_DEPARTMENT_OTHER): Payer: Self-pay | Admitting: Obstetrics and Gynecology

## 2022-03-13 NOTE — Progress Notes (Signed)
Spoke w/ via phone for pre-op interview--- [employer]----   ISTAT            Lab results------ COVID test -----patient states asymptomatic no test needed Arrive at -------0530 NPO after MN NO Solid Food.   Med rec completed Medications to take morning of surgery ----- Norvasc, Levothyroxine. Diabetic medication ----- Patient instructed no nail polish to be worn day of surgery Patient instructed to bring photo id and insurance card day of surgery Patient aware to have Driver (ride ) / caregiver  Husband [name]  for 24 hours after surgery  Patient Special Instructions ----- Pre-Op special Istructions ----- Patient verbalized understanding of instructions that were given at this phone interview. Patient denies shortness of breath, chest pain, fever, cough at this phone interview.

## 2022-03-14 ENCOUNTER — Telehealth: Payer: Medicare Other

## 2022-03-19 ENCOUNTER — Other Ambulatory Visit: Payer: Self-pay

## 2022-03-19 ENCOUNTER — Encounter (HOSPITAL_BASED_OUTPATIENT_CLINIC_OR_DEPARTMENT_OTHER)
Admission: RE | Disposition: A | Discharge status: Home / Self Care | Payer: Self-pay | Source: Home / Self Care | Attending: Obstetrics and Gynecology

## 2022-03-19 ENCOUNTER — Ambulatory Visit (HOSPITAL_BASED_OUTPATIENT_CLINIC_OR_DEPARTMENT_OTHER): Payer: Medicare Other | Admitting: Anesthesiology

## 2022-03-19 ENCOUNTER — Ambulatory Visit (HOSPITAL_BASED_OUTPATIENT_CLINIC_OR_DEPARTMENT_OTHER)
Admission: RE | Admit: 2022-03-19 | Discharge: 2022-03-19 | Disposition: A | Discharge status: Home / Self Care | Payer: Medicare Other | Attending: Obstetrics and Gynecology | Admitting: Obstetrics and Gynecology

## 2022-03-19 ENCOUNTER — Encounter (HOSPITAL_BASED_OUTPATIENT_CLINIC_OR_DEPARTMENT_OTHER): Payer: Self-pay | Admitting: Obstetrics and Gynecology

## 2022-03-19 DIAGNOSIS — E039 Hypothyroidism, unspecified: Secondary | ICD-10-CM

## 2022-03-19 DIAGNOSIS — I10 Essential (primary) hypertension: Secondary | ICD-10-CM | POA: Diagnosis not present

## 2022-03-19 DIAGNOSIS — G4730 Sleep apnea, unspecified: Secondary | ICD-10-CM | POA: Insufficient documentation

## 2022-03-19 DIAGNOSIS — I5032 Chronic diastolic (congestive) heart failure: Secondary | ICD-10-CM | POA: Insufficient documentation

## 2022-03-19 DIAGNOSIS — Z79810 Long term (current) use of selective estrogen receptor modulators (SERMs): Secondary | ICD-10-CM | POA: Diagnosis not present

## 2022-03-19 DIAGNOSIS — Z8742 Personal history of other diseases of the female genital tract: Secondary | ICD-10-CM | POA: Diagnosis not present

## 2022-03-19 DIAGNOSIS — Z79890 Hormone replacement therapy (postmenopausal): Secondary | ICD-10-CM | POA: Insufficient documentation

## 2022-03-19 DIAGNOSIS — Z853 Personal history of malignant neoplasm of breast: Secondary | ICD-10-CM | POA: Diagnosis not present

## 2022-03-19 DIAGNOSIS — I110 Hypertensive heart disease with heart failure: Secondary | ICD-10-CM | POA: Insufficient documentation

## 2022-03-19 DIAGNOSIS — E038 Other specified hypothyroidism: Secondary | ICD-10-CM | POA: Diagnosis not present

## 2022-03-19 DIAGNOSIS — Z79899 Other long term (current) drug therapy: Secondary | ICD-10-CM | POA: Insufficient documentation

## 2022-03-19 DIAGNOSIS — N882 Stricture and stenosis of cervix uteri: Secondary | ICD-10-CM | POA: Diagnosis not present

## 2022-03-19 DIAGNOSIS — M1990 Unspecified osteoarthritis, unspecified site: Secondary | ICD-10-CM | POA: Diagnosis not present

## 2022-03-19 DIAGNOSIS — K219 Gastro-esophageal reflux disease without esophagitis: Secondary | ICD-10-CM | POA: Insufficient documentation

## 2022-03-19 DIAGNOSIS — R9389 Abnormal findings on diagnostic imaging of other specified body structures: Secondary | ICD-10-CM | POA: Diagnosis not present

## 2022-03-19 DIAGNOSIS — N858 Other specified noninflammatory disorders of uterus: Secondary | ICD-10-CM | POA: Diagnosis present

## 2022-03-19 HISTORY — PX: DILATATION & CURETTAGE/HYSTEROSCOPY WITH MYOSURE: SHX6511

## 2022-03-19 LAB — POCT I-STAT, CHEM 8
BUN: 14 mg/dL (ref 8–23)
Calcium, Ion: 1.11 mmol/L — ABNORMAL LOW (ref 1.15–1.40)
Chloride: 109 mmol/L (ref 98–111)
Creatinine, Ser: 0.7 mg/dL (ref 0.44–1.00)
Glucose, Bld: 94 mg/dL (ref 70–99)
HCT: 35 % — ABNORMAL LOW (ref 36.0–46.0)
Hemoglobin: 11.9 g/dL — ABNORMAL LOW (ref 12.0–15.0)
Potassium: 3.5 mmol/L (ref 3.5–5.1)
Sodium: 146 mmol/L — ABNORMAL HIGH (ref 135–145)
TCO2: 22 mmol/L (ref 22–32)

## 2022-03-19 SURGERY — DILATATION & CURETTAGE/HYSTEROSCOPY WITH MYOSURE
Anesthesia: General

## 2022-03-19 MED ORDER — DEXAMETHASONE SODIUM PHOSPHATE 10 MG/ML IJ SOLN: INTRAMUSCULAR | Status: DC | PRN

## 2022-03-19 MED ORDER — AMISULPRIDE (ANTIEMETIC) 5 MG/2ML IV SOLN: 10.0000 mg | Freq: Once | INTRAVENOUS | Status: DC | PRN

## 2022-03-19 MED ORDER — LIDOCAINE HCL (PF) 2 % IJ SOLN
INTRAMUSCULAR | Status: AC
  Filled 2022-03-19: qty 5

## 2022-03-19 MED ORDER — LIDOCAINE 2% (20 MG/ML) 5 ML SYRINGE
INTRAMUSCULAR | Status: DC | PRN
  Administered 2022-03-19: 40 mg via INTRAVENOUS

## 2022-03-19 MED ORDER — ACETAMINOPHEN 500 MG PO TABS
ORAL_TABLET | ORAL | Status: AC
  Filled 2022-03-19: qty 2

## 2022-03-19 MED ORDER — FENTANYL CITRATE (PF) 100 MCG/2ML IJ SOLN: 25.0000 ug | INTRAMUSCULAR | Status: DC | PRN

## 2022-03-19 MED ORDER — PROPOFOL 10 MG/ML IV BOLUS
INTRAVENOUS | Status: AC
  Filled 2022-03-19: qty 20

## 2022-03-19 MED ORDER — FENTANYL CITRATE (PF) 100 MCG/2ML IJ SOLN
INTRAMUSCULAR | Status: AC
  Filled 2022-03-19: qty 2

## 2022-03-19 MED ORDER — ONDANSETRON HCL 4 MG/2ML IJ SOLN
INTRAMUSCULAR | Status: AC
  Filled 2022-03-19: qty 2

## 2022-03-19 MED ORDER — LACTATED RINGERS IV SOLN: INTRAVENOUS | Status: DC

## 2022-03-19 MED ORDER — ACETAMINOPHEN 500 MG PO TABS
1000.0000 mg | ORAL_TABLET | Freq: Once | ORAL | Status: AC
  Administered 2022-03-19: 1000 mg via ORAL

## 2022-03-19 MED ORDER — MIDAZOLAM HCL 2 MG/2ML IJ SOLN
INTRAMUSCULAR | Status: AC
  Filled 2022-03-19: qty 2

## 2022-03-19 MED ORDER — DEXAMETHASONE SODIUM PHOSPHATE 10 MG/ML IJ SOLN
INTRAMUSCULAR | Status: DC | PRN
  Administered 2022-03-19: 4 mg via INTRAVENOUS

## 2022-03-19 MED ORDER — FENTANYL CITRATE (PF) 100 MCG/2ML IJ SOLN
INTRAMUSCULAR | Status: DC | PRN
  Administered 2022-03-19 (×4): 25 ug via INTRAVENOUS

## 2022-03-19 MED ORDER — PROPOFOL 10 MG/ML IV BOLUS
INTRAVENOUS | Status: DC | PRN
  Administered 2022-03-19: 40 mg via INTRAVENOUS
  Administered 2022-03-19: 120 mg via INTRAVENOUS

## 2022-03-19 MED ORDER — DEXAMETHASONE SODIUM PHOSPHATE 10 MG/ML IJ SOLN
INTRAMUSCULAR | Status: AC
  Filled 2022-03-19: qty 1

## 2022-03-19 MED ORDER — ONDANSETRON HCL 4 MG/2ML IJ SOLN
INTRAMUSCULAR | Status: DC | PRN
  Administered 2022-03-19: 4 mg via INTRAVENOUS

## 2022-03-19 SURGICAL SUPPLY — 10 items
GLOVE BIO SURGEON STRL SZ 6.5 (GLOVE) ×1 IMPLANT
GOWN STRL REUS W/TWL LRG LVL3 (GOWN DISPOSABLE) ×1 IMPLANT
IV NS IRRIG 3000ML ARTHROMATIC (IV SOLUTION) ×1 IMPLANT
KIT PROCEDURE FLUENT (KITS) ×1 IMPLANT
KIT TURNOVER CYSTO (KITS) ×1 IMPLANT
PACK VAGINAL MINOR WOMEN LF (CUSTOM PROCEDURE TRAY) ×1 IMPLANT
PAD OB MATERNITY 4.3X12.25 (PERSONAL CARE ITEMS) ×1 IMPLANT
PAD PREP 24X48 CUFFED NSTRL (MISCELLANEOUS) ×1 IMPLANT
SEAL ROD LENS SCOPE MYOSURE (ABLATOR) ×1 IMPLANT
TOWEL OR 17X26 10 PK STRL BLUE (TOWEL DISPOSABLE) ×1 IMPLANT

## 2022-03-19 NOTE — Anesthesia Postprocedure Evaluation (Signed)
Anesthesia Post Note  Patient: [name]  Procedure(s) Performed: DILATATION & CURETTAGE/HYSTEROSCOPY     Patient location during evaluation: PACU Anesthesia Type: General Level of consciousness: awake and alert Pain management: pain level controlled Vital Signs Assessment: post-procedure vital signs reviewed and stable Respiratory status: spontaneous breathing, nonlabored ventilation, respiratory function stable and patient connected to nasal cannula oxygen Cardiovascular status: blood pressure returned to baseline and stable Postop Assessment: no apparent nausea or vomiting Anesthetic complications: no  No notable events documented.  Last Vitals:  Vitals:   03/19/22 0830 03/19/22 0900  BP: (!) 146/52 (!) 152/59  Pulse: 74 70  Resp: 13 15  Temp:  37 C  SpO2: 96% 99%    Last Pain:  Vitals:   03/19/22 0900  TempSrc:   PainSc: 0-No pain                 Tiajuana Amass

## 2022-03-19 NOTE — Discharge Instructions (Signed)
     No acetaminophen/Tylenol until after 12:00 pm today if needed.     Post Anesthesia Home Care Instructions  Activity: Get plenty of rest for the remainder of the day. A responsible individual must stay with you for 24 hours following the procedure.  For the next 24 hours, DO NOT: -Drive a car -Paediatric nurse -Drink alcoholic beverages -Take any medication unless instructed by your physician -Make any legal decisions or sign important papers.  Meals: Start with liquid foods such as gelatin or soup. Progress to regular foods as tolerated. Avoid greasy, spicy, heavy foods. If nausea and/or vomiting occur, drink only clear liquids until the nausea and/or vomiting subsides. Call your physician if vomiting continues.  Special Instructions/Symptoms: Your throat may feel dry or sore from the anesthesia or the breathing tube placed in your throat during surgery. If this causes discomfort, gargle with warm salt water. The discomfort should disappear within 24 hours.  If you had a scopolamine patch placed behind your ear for the management of post- operative nausea and/or vomiting:  1. The medication in the patch is effective for 72 hours, after which it should be removed.  Wrap patch in a tissue and discard in the trash. Wash hands thoroughly with soap and water. 2. You may remove the patch earlier than 72 hours if you experience unpleasant side effects which may include dry mouth, dizziness or visual disturbances. 3. Avoid touching the patch. Wash your hands with soap and water after contact with the patch.

## 2022-03-19 NOTE — Interval H&P Note (Signed)
History and Physical Interval Note:  03/19/2022 7:16 AM  [name]  has presented today for surgery, with the diagnosis of Thickened endometrium, on Tamoxifen.  The various methods of treatment have been discussed with the patient and family. After consideration of risks, benefits and other options for treatment, the patient has consented to  Procedure(s): [place] (N/A) as a surgical intervention.  The patient's history has been reviewed, patient examined, no change in status, stable for surgery.  I have reviewed the patient's chart and labs.  Questions were answered to the patient's satisfaction.     Salvadore Dom

## 2022-03-19 NOTE — Op Note (Signed)
Preoperative Diagnosis: thickened endometrial stripe  Postoperative Diagnosis: atrophic endometrium  Procedure: Hysteroscopy, dilation and curettage  Surgeon: Dr Sumner Boast  Assistants: None  Anesthesia: general via LMA  EBL: minimal  Fluids: 300 cc  Fluid deficit: 55  Urine output: not recorded  Indications for surgery: The patient is a 81 yo female, who was noted to have an incidental finding of a thickened endometrium on CT. U/S also showed a thickened endometrium. The patient has a history of breast cancer and is on tamoxifen, she also has a prior history of endometrial polyps.  The risks of the surgery were reviewed with the patient and the consent form was signed prior to her surgery.  Findings: EUA: normal sized uterus, no adnexal masses. Hysteroscopy: atrophic appearing endometrium, normal ostia on the right, scarring over the left ostia. No intracavitary masses.   Specimens: endometrial curettings    Procedure: The patient was taken to the operating room with an IV in place. She was placed in the dorsal lithotomy position and anesthesia was administered. She was prepped and draped in the usual sterile fashion for a vaginal procedure. She voided on the way to the OR. A weighted speculum was placed in the vagina and a single tooth tenaculum was placed on the anterior lip of the cervix. The cervix was dilated to a #7 hagar dilator. The uterus was sounded to 6-7 cm. The myosure hysteroscope was inserted into the uterine cavity. With continuous infusion of normal saline, the uterine cavity was visualized with the above findings. The myosure was then removed. The cavity was then curetted with the small sharp curette. The cavity had the characteristically gritty texture at the end of the procedure. The curette and the single tooth tenaculum were removed. The patients perineum was cleansed of betadine and she was taken out of the dorsal lithotomy position.  Upon awakening the LMA was  removed and the patient was transferred to the recovery room in stable and awake condition.  The sponge and instrument count were correct. There were no complications.

## 2022-03-19 NOTE — Transfer of Care (Signed)
Immediate Anesthesia Transfer of Care Note  Patient: [name]  Procedure(s) Performed: DILATATION & CURETTAGE/HYSTEROSCOPY  Patient Location: PACU  Anesthesia Type:General  Level of Consciousness: drowsy and patient cooperative  Airway & Oxygen Therapy: Patient Spontanous Breathing  Post-op Assessment: Report given to RN and Post -op Vital signs reviewed and stable  Post vital signs: Reviewed and stable  Last Vitals:  Vitals Value Taken Time  BP    Temp    Pulse 75 03/19/22 0807  Resp 15 03/19/22 0806  SpO2 95 % 03/19/22 0807  Vitals shown include unvalidated device data.  Last Pain:  Vitals:   03/19/22 0538  TempSrc: Oral         Complications: No notable events documented.

## 2022-03-19 NOTE — Anesthesia Procedure Notes (Signed)
Procedure Name: LMA Insertion Date/Time: 03/19/2022 7:38 AM  Performed by: Rogers Blocker, CRNAPre-anesthesia Checklist: Patient identified, Emergency Drugs available, Suction available and Patient being monitored Patient Re-evaluated:Patient Re-evaluated prior to induction Oxygen Delivery Method: Circle System Utilized Preoxygenation: Pre-oxygenation with 100% oxygen Induction Type: IV induction Ventilation: Mask ventilation without difficulty LMA: LMA inserted LMA Size: 4.0 Number of attempts: 1 Placement Confirmation: positive ETCO2 Tube secured with: Tape Dental Injury: Teeth and Oropharynx as per pre-operative assessment

## 2022-03-19 NOTE — Anesthesia Preprocedure Evaluation (Signed)
Anesthesia Evaluation  Patient identified by MRN, date of birth, ID band Patient awake    Reviewed: Allergy & Precautions, NPO status , Patient's Chart, lab work & pertinent test results  Airway Mallampati: III  TM Distance: >3 FB Neck ROM: Full    Dental  (+) Dental Advisory Given   Pulmonary sleep apnea    breath sounds clear to auscultation       Cardiovascular hypertension, Pt. on medications and Pt. on home beta blockers  Rhythm:Regular Rate:Normal     Neuro/Psych  Headaches  Neuromuscular disease    GI/Hepatic Neg liver ROS,GERD  ,,  Endo/Other  Hypothyroidism    Renal/GU negative Renal ROS     Musculoskeletal  (+) Arthritis ,    Abdominal   Peds  Hematology negative hematology ROS (+)   Anesthesia Other Findings   Reproductive/Obstetrics                             Anesthesia Physical Anesthesia Plan  ASA: 3  Anesthesia Plan: General   Post-op Pain Management: Tylenol PO (pre-op)*   Induction: Intravenous  PONV Risk Score and Plan: 3 and Dexamethasone, Ondansetron and Treatment may vary due to age or medical condition  Airway Management Planned: LMA  Additional Equipment:   Intra-op Plan:   Post-operative Plan: Extubation in OR  Informed Consent: I have reviewed the patients History and Physical, chart, labs and discussed the procedure including the risks, benefits and alternatives for the proposed anesthesia with the patient or authorized representative who has indicated his/her understanding and acceptance.     Dental advisory given  Plan Discussed with: CRNA  Anesthesia Plan Comments:        Anesthesia Quick Evaluation

## 2022-03-20 ENCOUNTER — Encounter (HOSPITAL_BASED_OUTPATIENT_CLINIC_OR_DEPARTMENT_OTHER): Payer: Self-pay | Admitting: Obstetrics and Gynecology

## 2022-03-20 LAB — SURGICAL PATHOLOGY

## 2022-03-22 ENCOUNTER — Other Ambulatory Visit: Payer: Self-pay | Admitting: Internal Medicine

## 2022-03-22 DIAGNOSIS — I5033 Acute on chronic diastolic (congestive) heart failure: Secondary | ICD-10-CM

## 2022-03-28 ENCOUNTER — Encounter: Payer: Self-pay | Admitting: Obstetrics and Gynecology

## 2022-03-28 ENCOUNTER — Ambulatory Visit (INDEPENDENT_AMBULATORY_CARE_PROVIDER_SITE_OTHER): Payer: Medicare Other | Admitting: Obstetrics and Gynecology

## 2022-03-28 VITALS — BP 138/68 | HR 66 | Wt 150.0 lb

## 2022-03-28 DIAGNOSIS — Z79810 Long term (current) use of selective estrogen receptor modulators (SERMs): Secondary | ICD-10-CM | POA: Diagnosis not present

## 2022-03-28 DIAGNOSIS — Z9889 Other specified postprocedural states: Secondary | ICD-10-CM | POA: Diagnosis not present

## 2022-03-28 DIAGNOSIS — R9389 Abnormal findings on diagnostic imaging of other specified body structures: Secondary | ICD-10-CM

## 2022-03-28 NOTE — Progress Notes (Signed)
GYNECOLOGY  VISIT   HPI: 81 y.o.   Married White or Caucasian Not Hispanic or Latino  female   G0P0 with No LMP recorded. Patient is postmenopausal.   here for 1 week post op check s/p hysteroscopy, D&C. Her lining appeared atrophic, pathology with inactive endometrium.  Patient states that she has not had any problems.    GYNECOLOGIC HISTORY: No LMP recorded. Patient is postmenopausal. Contraception:pmp  Menopausal hormone therapy: none         OB History     Gravida  0   Para      Term      Preterm      AB      Living         SAB      IAB      Ectopic      Multiple      Live Births                 Patient Active Problem List   Diagnosis Date Noted   Cognitive impairment 10/04/2021   (HFpEF) heart failure with preserved ejection fraction ([place]) 10/25/2020   Fusion of spine of thoracolumbar region 02/21/2018   Chronic midline low back pain with bilateral sciatica 11/06/2016   Genetic testing 09/23/2015   Malignant neoplasm of upper-outer quadrant of left breast in female, estrogen receptor positive ([place]) 09/07/2015   Family history of breast cancer    Family history of colon cancer    Prediabetes 08/30/2014   Endometriosis    Osteopenia    Hypertension    UTI (lower urinary tract infection) 03/05/2011   Dysuria 03/05/2011   Hypothyroidism 06/06/2009   FATIGUE 06/06/2009   ELEVATED BLOOD PRESSURE 06/06/2009   Disorder of bone and cartilage 09/09/2008   SCOLIOSIS 09/09/2008   Hyperlipidemia 07/30/2008   Migraine headache 07/30/2008   GERD 07/30/2008   ENTHESOPATHY OF HIP REGION 07/30/2008   HEADACHE 07/30/2008    Past Medical History:  Diagnosis Date   Anemia    Anxiety    Arthritis    knees   Atrophic vaginitis    Cancer ([place])    breast cancer - left   Diverticulosis    Endometriosis    Family history of breast cancer    Family history of colon cancer    GERD (gastroesophageal reflux disease)    Headache(784.0)    Heart murmur    as  a child only   History of kidney stones    passed stones, no surgery required   History of radiation therapy 01/30/16- 03/02/16   Left Breast 50 Gy in 25 fractions.    Hyperlipidemia    Hypertension    Hypothyroidism    Internal hemorrhoids    Osteopenia    Osteoporosis    Personal history of chemotherapy    Personal history of radiation therapy    Rheumatic fever    age 65   Sleep apnea    " very mild" does not wear CPAP   Thyroid disease    Hypothyroid   Ulcer    UTI (lower urinary tract infection)     Past Surgical History:  Procedure Laterality Date   APPENDECTOMY     BREAST BIOPSY Left 08/19/2015   BREAST LUMPECTOMY Left 09/08/2015   BREAST LUMPECTOMY WITH RADIOACTIVE SEED AND SENTINEL LYMPH NODE BIOPSY Left 09/09/2015   Procedure: LEFT BREAST LUMPECTOMY WITH RADIOACTIVE SEED AND SENTINEL LYMPH NODE BIOPSY;  Surgeon: Jackolyn Confer, MD;  Location: [facility];  Service:  General;  Laterality: Left;   COLONOSCOPY     DILATATION & CURETTAGE/HYSTEROSCOPY WITH MYOSURE N/A 10/09/2016   Procedure: DILATATION & CURETTAGE/HYSTEROSCOPY WITH MYOSURE;  Surgeon: Princess Bruins, MD;  Location: [facility];  Service: Gynecology;  Laterality: N/A;  requesting 7:30am in our block  requests one hour   [place] N/A 03/19/2022   Procedure: DILATATION & CURETTAGE/HYSTEROSCOPY;  Surgeon: Salvadore Dom, MD;  Location: [facility];  Service: Gynecology;  Laterality: N/A;   ESOPHAGOGASTRODUODENOSCOPY ENDOSCOPY     IR GENERIC HISTORICAL  12/05/2015   IR CV LINE INJECTION 12/05/2015 [name], MD [facility]INTER[facility] RAD   LAPAROSCOPIC ENDOMETRIOSIS FULGURATION  1978   PELVIC LAPAROSCOPY     PORTACATH PLACEMENT N/A 10/04/2015   Procedure: INSERTION PORT-A-CATH WITH ULTRASOUND;  Surgeon: Jackolyn Confer, MD;  Location: [facility];  Service: General;  Laterality: N/A;   portacath removal     TONSILLECTOMY     ULNAR NERVE REPAIR  2010    Current Outpatient  Medications  Medication Sig Dispense Refill   amLODipine (NORVASC) 5 MG tablet TAKE ONE TABLET BY MOUTH EVERY MORNING 90 tablet 3   atorvastatin (LIPITOR) 20 MG tablet Take 1 tablet (20 mg total) by mouth daily. 90 tablet 3   esomeprazole (NEXIUM) 40 MG capsule Take 1 capsule (40 mg total) by mouth daily. 90 capsule 0   furosemide (LASIX) 20 MG tablet TAKE ONE TABLET BY MOUTH EVERY MORNING 90 tablet 3   HYDROcodone-acetaminophen (NORCO/VICODIN) 5-325 MG tablet Take 1 tablet by mouth every 6 (six) hours as needed for moderate pain.     levothyroxine (SYNTHROID) 50 MCG tablet TAKE ONE TABLET BY MOUTH BEFORE BREAKFAST 90 tablet 3   losartan-hydrochlorothiazide (HYZAAR) 100-12.5 MG tablet TAKE ONE TABLET BY MOUTH EVERY MORNING 90 tablet 3   Melatonin 3 MG TBDP Take by mouth.     metoprolol tartrate (LOPRESSOR) 25 MG tablet TAKE ONE TABLET BY MOUTH EVERY MORNING and TAKE ONE TABLET BY MOUTH EVERYDAY AT BEDTIME 180 tablet 3   omeprazole (PRILOSEC) 40 MG capsule Take 40 mg by mouth daily.     potassium chloride SA (KLOR-CON M) 20 MEQ tablet Take 1 tablet (20 mEq total) by mouth 2 (two) times daily. 10 tablet 0   tamoxifen (NOLVADEX) 20 MG tablet Take 1 tablet (20 mg total) by mouth daily. 30 tablet 12   traZODone (DESYREL) 50 MG tablet Take 50 mg by mouth at bedtime.     TURMERIC PO Take 500 mg by mouth daily.      Current Facility-Administered Medications  Medication Dose Route Frequency Provider Last Rate Last Admin   betamethasone acetate-betamethasone sodium phosphate (CELESTONE) injection 12 mg  12 mg Other Once Magnus Sinning, MD         ALLERGIES: Nitrofurantoin and Sulfamethoxazole-trimethoprim  Family History  Problem Relation Age of Onset   Breast cancer Mother        Age 24   Hypertension Mother    Heart disease Father    Stroke Father    Colon cancer Father        dx 44s   Breast cancer Paternal Grandmother        Age 83   Diabetes Paternal Grandmother    Heart disease  Maternal Grandmother     Social History   Socioeconomic History   Marital status: Married    Spouse name: Not on file   Number of children: 0   Years of education: Not on file  Highest education level: Not on file  Occupational History   Occupation: retired  Tobacco Use   Smoking status: Never   Smokeless tobacco: Never  Vaping Use   Vaping Use: Never used  Substance and Sexual Activity   Alcohol use: No   Drug use: No   Sexual activity: Not Currently    Birth control/protection: Post-menopausal  Other Topics Concern   Not on file  Social History Narrative   Retired Oncologist.    Social Determinants of Health   Financial Resource Strain: Low Risk  (01/22/2022)   Overall Financial Resource Strain (CARDIA)    Difficulty of Paying Living Expenses: Not very hard  Food Insecurity: No Food Insecurity (03/17/2021)   Hunger Vital Sign    Worried About Running Out of Food in the Last Year: Never true    Ran Out of Food in the Last Year: Never true  Transportation Needs: No Transportation Needs (03/17/2021)   PRAPARE - Hydrologist (Medical): No    Lack of Transportation (Non-Medical): No  Physical Activity: Insufficiently Active (03/17/2021)   Exercise Vital Sign    Days of Exercise per Week: 3 days    Minutes of Exercise per Session: 10 min  Stress: No Stress Concern Present (03/17/2021)   [place]    Feeling of Stress : Not at all  Social Connections: [place] (03/17/2021)   Social Connection and Isolation Panel [NHANES]    Frequency of Communication with Friends and Family: More than three times a week    Frequency of Social Gatherings with Friends and Family: More than three times a week    Attends Religious Services: More than 4 times per year    Active Member of [employer] or Organizations: Yes    Attends Archivist Meetings: More than 4 times per  year    Marital Status: Married  Human resources officer Violence: Not At Risk (03/17/2021)   Humiliation, Afraid, Rape, and Kick questionnaire    Fear of Current or Ex-Partner: No    Emotionally Abused: No    Physically Abused: No    Sexually Abused: No    Review of Systems  All other systems reviewed and are negative.   PHYSICAL EXAMINATION:    BP 138/68   Pulse 66   Wt 150 lb (68 kg)   SpO2 99%   BMI 29.29 kg/m     General appearance: alert, cooperative and appears stated age Abdomen: soft, non-tender; non distended, no masses,  no organomegaly  1. History of hysteroscopy She had an incidental finding of a thickened endometrium, hysteroscopy was negative for intracavitary defects. Pathology returned with inactive endometrium. -Routine care.

## 2022-04-06 ENCOUNTER — Telehealth: Payer: Self-pay | Admitting: Pharmacist

## 2022-04-06 NOTE — Chronic Care Management (AMB) (Addendum)
Chronic Care Management Pharmacy Assistant   Name: Shaquasia Caponigro  MRN: [id number] DOB: 04/20/1940  Reason for Encounter: Disease State and Medication Review   Conditions to be addressed/monitored: HTN  Recent office visits:  None  Recent consult visits:  03/28/2022 Sumner Boast MD(OBGYN) - Patient was seen for history of hysteroscopy. No medication changes. No follow up noted.   Hospital visits:  Patient was admitted to [facility] on 03/19/22 (3 hours) for dilatation and curettage/Hysteroscopy.  New?Medications Started at [facility] Discharge:?? No medications started Medication Changes at Hospital Discharge: No medication changes Medications Discontinued at Hospital Discharge: Misoprostol 200 mcg tablet Medications that remain the same after Hospital Discharge:??  -All other medications will remain the same.    Medications: Outpatient Encounter Medications as of 04/06/2022  Medication Sig   amLODipine (NORVASC) 5 MG tablet TAKE ONE TABLET BY MOUTH EVERY MORNING   atorvastatin (LIPITOR) 20 MG tablet Take 1 tablet (20 mg total) by mouth daily.   esomeprazole (NEXIUM) 40 MG capsule Take 1 capsule (40 mg total) by mouth daily.   furosemide (LASIX) 20 MG tablet TAKE ONE TABLET BY MOUTH EVERY MORNING   HYDROcodone-acetaminophen (NORCO/VICODIN) 5-325 MG tablet Take 1 tablet by mouth every 6 (six) hours as needed for moderate pain.   levothyroxine (SYNTHROID) 50 MCG tablet TAKE ONE TABLET BY MOUTH BEFORE BREAKFAST   losartan-hydrochlorothiazide (HYZAAR) 100-12.5 MG tablet TAKE ONE TABLET BY MOUTH EVERY MORNING   Melatonin 3 MG TBDP Take by mouth.   metoprolol tartrate (LOPRESSOR) 25 MG tablet TAKE ONE TABLET BY MOUTH EVERY MORNING and TAKE ONE TABLET BY MOUTH EVERYDAY AT BEDTIME   omeprazole (PRILOSEC) 40 MG capsule Take 40 mg by mouth daily.   potassium chloride SA (KLOR-CON M) 20 MEQ tablet Take 1 tablet (20 mEq total) by mouth 2 (two) times daily.    tamoxifen (NOLVADEX) 20 MG tablet Take 1 tablet (20 mg total) by mouth daily.   traZODone (DESYREL) 50 MG tablet Take 50 mg by mouth at bedtime.   TURMERIC PO Take 500 mg by mouth daily.    Facility-Administered Encounter Medications as of 04/06/2022  Medication   betamethasone acetate-betamethasone sodium phosphate (CELESTONE) injection 12 mg   Reviewed chart for medication changes ahead of medication coordination call.  BP Readings from Last 3 Encounters:  03/28/22 138/68  03/19/22 (!) 152/59  03/05/22 138/60    Lab Results  Component Value Date   HGBA1C 5.8 (A) 03/05/2022    Reviewed chart prior to disease state call. Spoke with patient regarding BP  Recent Office Vitals: BP Readings from Last 3 Encounters:  03/28/22 138/68  03/19/22 (!) 152/59  03/05/22 138/60   Pulse Readings from Last 3 Encounters:  03/28/22 66  03/19/22 70  03/05/22 94    Wt Readings from Last 3 Encounters:  03/28/22 150 lb (68 kg)  03/19/22 150 lb 1.6 oz (68.1 kg)  03/05/22 147 lb 3.2 oz (66.8 kg)     Kidney Function Lab Results  Component Value Date/Time   CREATININE 0.70 03/19/2022 06:13 AM   CREATININE 1.03 03/05/2022 02:23 PM   CREATININE 0.92 (H) 09/16/2020 02:47 PM   CREATININE 0.83 03/26/2018 02:42 PM   CREATININE 1.1 01/03/2017 10:52 AM   CREATININE 1.2 (H) 07/12/2016 10:58 AM   GFR 50.88 (L) 03/05/2022 02:23 PM   GFRNONAA >60 01/30/2022 03:47 AM   GFRNONAA >60 03/26/2018 02:42 PM   GFRAA >60 03/26/2018 02:42 PM       Latest Ref Rng &  Units 03/19/2022    6:13 AM 03/05/2022    2:23 PM 01/30/2022    3:47 AM  BMP  Glucose 70 - 99 mg/dL 94  87  99   BUN 8 - 23 mg/dL '14  23  18   '$ Creatinine 0.44 - 1.00 mg/dL 0.70  1.03  0.77   Sodium 135 - 145 mmol/L 146  141  139   Potassium 3.5 - 5.1 mmol/L 3.5  4.3  3.8   Chloride 98 - 111 mmol/L 109  105  104   CO2 19 - 32 mEq/L  26  26   Calcium 8.4 - 10.5 mg/dL  10.1  10.0     Current antihypertensive regimen:  Amlodipine 5 mg  daily Furosemide 20 mg daily Hyzaar 100/12.5 daily Metoprolol 25 mg twice daily  How often are you checking your Blood Pressure? Patient cannot find her blood pressure cuff, she has not checked her blood pressures in about 3-4 weeks. She plans to find it and start checking blood pressures and documenting them.   Current home BP readings: Patient is unable to check her blood pressure today and does not recall any of her previous readings.   What recent interventions/DTPs have been made by any provider to improve Blood Pressure control since last CPP Visit: No recent interventions  Any recent hospitalizations or ED visits since last visit with CPP? Patient was admitted to [facility] on 03/19/22 (3 hours) for dilatation and curettage/Hysteroscopy.   What diet changes have been made to improve Blood Pressure Control?  Patient follows no specific way of eating Breakfast - patient will have cereal or toast with two eggs daily Lunch - patient will have soup and a sandwich Dinner - patient will have a meal containing a meat and two vegetables.  What exercise is being done to improve your Blood Pressure Control?  Patient is limited due to knee pain however, she does all her own cooking and cleaning.   Adherence Review: Is the patient currently on ACE/ARB medication? Yes Does the patient have >5 day gap between last estimated fill dates? No  Patient obtains medications through Adherence Packaging  30 Days    Last adherence delivery included: Womens 50+ MVI - take 1 tablet at bedtime Tamoxifen 20 mg - take 1 tablet at breakfast Levothyroxine 50 mcg - take 1 tablet before breakfast Furosemide 20 mg  - take 1 tablet at breakfast Atorvastatin 20 mg - take 1 tablet at bedtime Esomeprazole 40 mg - take 1 tablet at breakfast Metoprolol Tartrate 25 mg - take 1 tablet at breakfast and  bedtime Amlodipine 5 mg - take 1 tablet at breakfast Losartan HCTZ 100/12.5 mg - take 1 tablet at  breakfast   Patient declined last month: No medications declined   Patient is due for next adherence delivery on: 04/20/2022   Called patient and reviewed medications and coordinated delivery.   This delivery to include: Womens 50+ MVI - take 1 tablet at bedtime Tamoxifen 20 mg - take 1 tablet at breakfast Levothyroxine 50 mcg - take 1 tablet before breakfast Furosemide 20 mg  - take 1 tablet at breakfast Atorvastatin 20 mg - take 1 tablet at bedtime Esomeprazole 40 mg - take 1 tablet at breakfast Metoprolol Tartrate 25 mg - take 1 tablet at breakfast and  bedtime Amlodipine 5 mg - take 1 tablet at breakfast Losartan HCTZ 100/12.5 mg - take 1 tablet at breakfast   Patient will need a short fill: No  short fills   Coordinated acute fill: No acute fills   Patient declined the following: No medications declined  Confirmed delivery date of 04/20/2022, advised patient that pharmacy will contact them the morning of delivery.   Care Gaps: AWV - scheduled 04/13/2022 Last BP - 138/68 on 03/28/2022 Last A1C - 5.8 on 03/05/2022 Shingrix - overdue Covid - overdue AWV - overdue  Star Rating Drugs: Losartan HCTZ 100/12.5 mg - last filled 03/19/2022 30 DS at [facility] Atorvastatin 20 mg - last filled 03/19/2022 30 DS at [facility] [phone]

## 2022-04-26 ENCOUNTER — Telehealth (INDEPENDENT_AMBULATORY_CARE_PROVIDER_SITE_OTHER): Payer: Medicare Other | Admitting: Family Medicine

## 2022-04-26 VITALS — Ht 61.0 in | Wt 150.0 lb

## 2022-04-26 DIAGNOSIS — Z0000 Encounter for general adult medical examination without abnormal findings: Secondary | ICD-10-CM | POA: Diagnosis not present

## 2022-04-26 DIAGNOSIS — M25562 Pain in left knee: Secondary | ICD-10-CM

## 2022-04-26 DIAGNOSIS — G8929 Other chronic pain: Secondary | ICD-10-CM

## 2022-04-26 NOTE — Patient Instructions (Addendum)
I really enjoyed getting to talk with you today! I am available on Tuesdays and Thursdays for virtual visits if you have any questions or concerns, or if I can be of any further assistance.   CHECKLIST FROM ANNUAL WELLNESS VISIT:  -Follow up (please call to schedule if not scheduled after visit):  -Inperson visit with your Primary Doctor office: every 3-6 months and as needed. Please schedule visit with Dr. Elease Hashimoto or with Orthopedics to follow up on the knee. I think physical therapy may be a good idea if you can.  -yearly for annual wellness visit with primary care office  Here is a list of your preventive care/health maintenance measures and the plan for each if any are due:  Health Maintenance  Topic Date Due   Zoster Vaccines- Shingrix (2 of 2) 09/01/2020; please get your second does at the pharmacy   COVID-19 Vaccine (4 - 2023-24 season) 12/15/2021, this will likely be due yearly like the flu shot. Can get at the pharmacy.   Medicare Annual Wellness (AWV)  04/27/2023   PAP SMEAR-Modifier  11/25/2023   DTaP/Tdap/Td (2 - Td or Tdap) 02/08/2025   Pneumonia Vaccine 22+ Years old  Completed   INFLUENZA VACCINE  Completed   DEXA SCAN  Completed   HPV VACCINES  Aged Out    -See a dentist at least yearly  -Get your eyes checked and then per your eye specialist's recommendations  -Other issues addressed today:  -I have included below further information regarding a healthy whole foods based diet, physical activity guidelines for adults, stress management and opportunities for social connections. I hope you find this information useful.     NUTRITION: -eat real food: lots of colorful vegetables (half the plate) and fruits -5-7 servings  of vegetables and fruits per day (fresh or steamed is best), exp. 2 servings of vegetables with lunch and dinner and 2 servings of fruit per day. Berries and greens such as kale and collards are great choices.  -consume on a regular basis: whole grains (make sure first ingredient on label contains the word "whole"), fresh fruits, fish, nuts, seeds, healthy oils (such as olive oil, avocado oil, grape seed oil) -may eat small amounts of dairy and lean meat on occasion, but avoid processed meats such as ham, bacon, lunch meat, etc. -drink water -try to avoid fast food and pre-packaged foods, processed meat -most experts advise limiting sodium to < '2300mg'$  per day, should limit further is any chronic conditions such as high blood pressure, heart disease, diabetes, etc. The American Heart Association advised that < '1500mg'$  is is ideal -try to avoid foods that contain any ingredients with names you do not recognize  -try to avoid sugar/sweets (except for the natural sugar that occurs in fresh fruit) -try to avoid sweet drinks -try to avoid white rice, white bread, pasta (unless whole grain), white or yellow potatoes  EXERCISE GUIDELINES FOR ADULTS: -if you wish to increase your physical activity, do so gradually and with the approval of your doctor - it might be good to do physical therapy help figure out ways to exercise with the knee issues -STOP and seek medical care immediately if you have any chest pain, chest discomfort or trouble breathing when starting or increasing exercise  -move and stretch your body, legs, feet and arms when sitting for long periods -Physical activity guidelines for optimal health in adults: -least 150 minutes per week of aerobic exercise (can talk, but not sing) once approved by your doctor,  20-30 minutes of sustained activity or two 10 minute episodes of sustained activity every day.  -resistance training at least 2 days per week if approved by your doctor -balance exercises  3+ days per week:   Stand somewhere where you have something sturdy to hold onto if you lose balance.    1) lift up on toes, start with 5x per day and work up to 20x   2) stand and lift on leg straight out to the side so that foot is a few inches of the floor, start with 5x each side and work up to 20x each side   3) stand on one foot, start with 5 seconds each side and work up to 20 seconds on each side  If you need ideas or help with getting more active:  -Silver sneakers https://tools.silversneakers.com  -Walk with a Doc: http://stephens-thompson.biz/  -try to include resistance (weight lifting/strength building) and balance exercises twice per week: or the following link for ideas: ChessContest.fr  UpdateClothing.com.cy  STRESS MANAGEMENT: -can try meditating, or just sitting quietly with deep breathing while intentionally relaxing all parts of your body for 5 minutes daily -if you need further help with stress, anxiety or depression please follow up with your primary doctor or contact the wonderful folks at [place]: [place]: -options in [place] if you wish to engage in more social and exercise related activities:  -Silver sneakers https://tools.silversneakers.com  -Walk with a Doc: http://stephens-thompson.biz/  -Check out the [place] 50+ section on the [place] [employer] (hiking clubs, book clubs, cards and games, chess, exercise classes, aquatic classes and much more) - see the website for details: https://www.[place].gov/departments/parks-recreation/active-adults50  -YouTube has lots of exercise videos for different ages and abilities as well  -[place] (a variety of indoor and outdoor inperson activities for adults). 747-696-0399. [address]  -Virtual Online Classes (a variety of  topics): see seniorplanet.org or call (431)156-5885  -consider volunteering at a school, hospice center, church, senior center or elsewhere

## 2022-04-26 NOTE — Progress Notes (Signed)
PATIENT CHECK-IN and HEALTH RISK ASSESSMENT QUESTIONNAIRE:  -completed by phone/video for upcoming Medicare Preventive Visit  Pre-Visit Check-in: 1)Vitals (height, wt, BP, etc) - record in vitals section for visit on day of visit 2)Review and Update Medications, Allergies PMH, Surgeries, Social history in Epic 3)Hospitalizations in the last year with date/reason? None per patient 4)Review and Update Care Team (patient's specialists) in Epic 5) Complete PHQ9 in Epic  6) Complete Fall Screening in Epic 7)Review all Health Maintenance Due and order under PCP if not done.  8)Medicare Wellness Questionnaire: Answer theses question about your habits: Do you drink alcohol? No  Have you ever smoked?No Do you exercises? No, trouble with left knee  Typical breakfast: prepares her own meals, usually whole grain toast, oatmeal, eggs sometimes Typical lunch: prepares her own lunch, sandwich - whole grain bread, soup Typical dinner: makes dinner or goes out, likes vegetables and fruits Typical snacks:fruits  Beverages: water - ice water, rarely drinks soda, tea  Answer theses question about you: Can you perform most household chores? Yes Do you find it hard to follow a conversation in a noisy room? No Do you often ask people to speak up or repeat themselves? Yes, but feels is mild, does not feel needs heating aids Do you feel that you have a problem with memory? No Do you balance your checkbook and or bank acounts? Yes Do you feel safe at home? Yes Last dentist visit? Less than 6 months Do you need assistance with any of the following: Please note if so No  Driving? No    Feeding yourself? No  Getting from bed to chair? No  Getting to the toilet? No  Bathing or showering? No  Dressing yourself? No  Managing money? No  Climbing a flight of stairs No  Preparing meals? No  Do you have Advanced Directives in place (Living Will, Healthcare Power or Attorney)? Yes   Last eye Exam and  location? Recent   Do you currently use prescribed or non-prescribed narcotic or opioid pain medications? No  Do you have a history or close family history of breast, ovarian, tubal or peritoneal cancer or a family member with BRCA (breast cancer susceptibility 1 and 2) gene mutations? No  Nurse/Assistant Credentials/time stamp: Petra Kuba, CMA 04/26/2022 12:49PM  ----------------------------------------------------------------------------------------------------------------------------------------------------------------------------------------------------------------------   MEDICARE ANNUAL PREVENTIVE VISIT WITH PROVIDER: (Welcome to [facility], initial annual wellness or annual wellness exam)  Virtual Visit via Phone Note  I connected with Liddy  on 04/26/22 by phone and verified that I am speaking with the correct person using two identifiers.  Location patient: home Location provider:work or home office Persons participating in the virtual visit: patient, provider  Concerns and/or follow up today: Not really. Sometimes has some L knee pain. This has interfered with activities. Sometimes catches - no falls. This has been chronic issues. Per review of chart does have sig degenerative changes on prior exercise and saw Dr. Elmyra Ricks - but I can't pull up the notes. She could not recall who she had seen. She is interested in knowing what her option are for this.    See HM section in Epic for other details of completed HM.    ROS: negative for report of fevers, unintentional weight loss, vision changes, vision loss, hearing loss or change, chest pain, sob, hemoptysis, melena, hematochezia, hematuria, genital discharge or lesions, falls, bleeding or bruising, loc, thoughts of suicide or self harm, memory loss  Patient-completed extensive health risk assessment - reviewed and discussed with the patient: See  Health Risk Assessment completed with patient prior to the visit either above or  in recent phone note. This was reviewed in detailed with the patient today and appropriate recommendations, orders and referrals were placed as needed per Summary below and patient instructions.   Review of Medical History: -PMH, PSH, Family History and current specialty and care providers reviewed and updated and listed below   Patient Care Team: [name], MD as PCP - General [name], [name], MD (Inactive) as Consulting Physician (Oncology) Jackolyn Confer, MD as Consulting Physician (General Surgery) Eppie Gibson, MD as Attending Physician (Radiation Oncology) Richmond Campbell, MD as Consulting Physician (Gastroenterology) Lavonna Monarch, MD (Inactive) as Consulting Physician (Dermatology) Delice Bison, Charlestine Massed, NP as Nurse Practitioner (Hematology and Oncology) Viona Gilmore, [facility] as Pharmacist (Pharmacist)   Past Medical History:  Diagnosis Date   Anemia    Anxiety    Arthritis    knees   Atrophic vaginitis    Cancer [facility])    breast cancer - left   Diverticulosis    Endometriosis    Family history of breast cancer    Family history of colon cancer    GERD (gastroesophageal reflux disease)    Headache(784.0)    Heart murmur    as a child only   History of kidney stones    passed stones, no surgery required   History of radiation therapy 01/30/16- 03/02/16   Left Breast 50 Gy in 25 fractions.    Hyperlipidemia    Hypertension    Hypothyroidism    Internal hemorrhoids    Osteopenia    Osteoporosis    Personal history of chemotherapy    Personal history of radiation therapy    Rheumatic fever    age 50   Sleep apnea    " very mild" does not wear CPAP   Thyroid disease    Hypothyroid   Ulcer    UTI (lower urinary tract infection)     Past Surgical History:  Procedure Laterality Date   APPENDECTOMY     BREAST BIOPSY Left 08/19/2015   BREAST LUMPECTOMY Left 09/08/2015   BREAST LUMPECTOMY WITH RADIOACTIVE SEED AND SENTINEL LYMPH NODE BIOPSY  Left 09/09/2015   Procedure: LEFT BREAST LUMPECTOMY WITH RADIOACTIVE SEED AND SENTINEL LYMPH NODE BIOPSY;  Surgeon: Jackolyn Confer, MD;  Location: [place];  Service: General;  Laterality: Left;   COLONOSCOPY     DILATATION & CURETTAGE/HYSTEROSCOPY WITH MYOSURE N/A 10/09/2016   Procedure: [place];  Surgeon: Princess Bruins, MD;  Location: [facility];  Service: Gynecology;  Laterality: N/A;  requesting 7:30am in our block  requests one hour   [place] N/A 03/19/2022   Procedure: DILATATION & CURETTAGE/HYSTEROSCOPY;  Surgeon: Salvadore Dom, MD;  Location: [place];  Service: Gynecology;  Laterality: N/A;   ESOPHAGOGASTRODUODENOSCOPY ENDOSCOPY     IR GENERIC HISTORICAL  12/05/2015   IR CV LINE INJECTION 12/05/2015 [name], MD [facility]-INTERV RAD   LAPAROSCOPIC ENDOMETRIOSIS FULGURATION  1978   PELVIC LAPAROSCOPY     PORTACATH PLACEMENT N/A 10/04/2015   Procedure: INSERTION PORT-A-CATH WITH ULTRASOUND;  Surgeon: Jackolyn Confer, MD;  Location: [place];  Service: General;  Laterality: N/A;   portacath removal     TONSILLECTOMY     ULNAR NERVE REPAIR  2010    Social History   Socioeconomic History   Marital status: Married    Spouse name: Not on file   Number of children: 0  Years of education: Not on file   Highest education level: Not on file  Occupational History   Occupation: retired  Tobacco Use   Smoking status: Never   Smokeless tobacco: Never  Vaping Use   Vaping Use: Never used  Substance and Sexual Activity   Alcohol use: No   Drug use: No   Sexual activity: Not Currently    Birth control/protection: Post-menopausal  Other Topics Concern   Not on file  Social History Narrative   Retired Oncologist.    Social Determinants of Health   Financial Resource Strain: Low Risk  (01/22/2022)   Overall Financial Resource Strain (CARDIA)    Difficulty of Paying Living  Expenses: Not very hard  Food Insecurity: No Food Insecurity (03/17/2021)   Hunger Vital Sign    Worried About Running Out of Food in the Last Year: Never true    Ran Out of Food in the Last Year: Never true  Transportation Needs: No Transportation Needs (03/17/2021)   PRAPARE - Hydrologist (Medical): No    Lack of Transportation (Non-Medical): No  Physical Activity: Insufficiently Active (03/17/2021)   Exercise Vital Sign    Days of Exercise per Week: 3 days    Minutes of Exercise per Session: 10 min  Stress: No Stress Concern Present (03/17/2021)   [place]    Feeling of Stress : Not at all  Social Connections: [place] (03/17/2021)   Social Connection and Isolation Panel [NHANES]    Frequency of Communication with Friends and Family: More than three times a week    Frequency of Social Gatherings with Friends and Family: More than three times a week    Attends Religious Services: More than 4 times per year    Active Member of [employer] or Organizations: Yes    Attends Music therapist: More than 4 times per year    Marital Status: Married  Human resources officer Violence: Not At Risk (03/17/2021)   Humiliation, Afraid, Rape, and Kick questionnaire    Fear of Current or Ex-Partner: No    Emotionally Abused: No    Physically Abused: No    Sexually Abused: No    Family History  Problem Relation Age of Onset   Breast cancer Mother        Age 60   Hypertension Mother    Heart disease Father    Stroke Father    Colon cancer Father        dx 74s   Breast cancer Paternal Grandmother        Age 45   Diabetes Paternal Grandmother    Heart disease Maternal Grandmother     Current Outpatient Medications on File Prior to Visit  Medication Sig Dispense Refill   amLODipine (NORVASC) 5 MG tablet TAKE ONE TABLET BY MOUTH EVERY MORNING 90 tablet 3   atorvastatin (LIPITOR) 20 MG  tablet Take 1 tablet (20 mg total) by mouth daily. 90 tablet 3   esomeprazole (NEXIUM) 40 MG capsule Take 1 capsule (40 mg total) by mouth daily. 90 capsule 0   furosemide (LASIX) 20 MG tablet TAKE ONE TABLET BY MOUTH EVERY MORNING 90 tablet 3   HYDROcodone-acetaminophen (NORCO/VICODIN) 5-325 MG tablet Take 1 tablet by mouth every 6 (six) hours as needed for moderate pain.     levothyroxine (SYNTHROID) 50 MCG tablet TAKE ONE TABLET BY MOUTH BEFORE BREAKFAST 90 tablet 3   losartan-hydrochlorothiazide (HYZAAR) 100-12.5 MG  tablet TAKE ONE TABLET BY MOUTH EVERY MORNING 90 tablet 3   Melatonin 3 MG TBDP Take by mouth.     metoprolol tartrate (LOPRESSOR) 25 MG tablet TAKE ONE TABLET BY MOUTH EVERY MORNING and TAKE ONE TABLET BY MOUTH EVERYDAY AT BEDTIME 180 tablet 3   omeprazole (PRILOSEC) 40 MG capsule Take 40 mg by mouth daily.     potassium chloride SA (KLOR-CON M) 20 MEQ tablet Take 1 tablet (20 mEq total) by mouth 2 (two) times daily. 10 tablet 0   tamoxifen (NOLVADEX) 20 MG tablet Take 1 tablet (20 mg total) by mouth daily. 30 tablet 12   traZODone (DESYREL) 50 MG tablet Take 50 mg by mouth at bedtime.     TURMERIC PO Take 500 mg by mouth daily.      Current Facility-Administered Medications on File Prior to Visit  Medication Dose Route Frequency Provider Last Rate Last Admin   betamethasone acetate-betamethasone sodium phosphate (CELESTONE) injection 12 mg  12 mg Other Once Magnus Sinning, MD        Allergies  Allergen Reactions   Nitrofurantoin Nausea And Vomiting    GI upset   Sulfamethoxazole-Trimethoprim Nausea And Vomiting and Other (See Comments)    GI upset       Physical Exam There were no vitals filed for this visit. Estimated body mass index is 28.34 kg/m as calculated from the following:   Height as of this encounter: '5\' 1"'$  (1.549 m).   Weight as of this encounter: 150 lb (68 kg).  EKG (optional): deferred due to virtual visit  GENERAL: alert, oriented, no audible  sounds of distress, vision exam deferred due to virtual visit  PSYCH/NEURO: pleasant and cooperative, no obvious depression or anxiety, speech and thought processing grossly intact, Cognitive function grossly intact  Flowsheet Row Video Visit from 04/26/2022 in [place] at [facility]  PHQ-9 Total Score 0           04/26/2022   12:43 PM 03/05/2022    1:54 PM 03/05/2022    1:53 PM 05/08/2021   11:32 AM 03/17/2021    2:11 PM  Depression screen PHQ 2/9  Decreased Interest 0 0 0 1 0  Down, Depressed, Hopeless 0 0 0 0 0  PHQ - 2 Score 0 0 0 1 0  Altered sleeping 0      Tired, decreased energy 0      Change in appetite 0      Feeling bad or failure about yourself  0      Trouble concentrating 0      Moving slowly or fidgety/restless 0      Suicidal thoughts 0      PHQ-9 Score 0      Difficult doing work/chores Not difficult at all           05/08/2021   11:32 AM 10/02/2021   11:56 PM 01/30/2022    3:42 AM 03/05/2022    1:53 PM 04/26/2022   12:43 PM  Fall Risk  Falls in the past year? 1   1 0  Was there an injury with Fall? 0   0 0  Fall Risk Category Calculator 2   2 0  Fall Risk Category Moderate   Moderate Low  Patient Fall Risk Level  Low fall risk Low fall risk Low fall risk Low fall risk  Patient at Risk for Falls Due to    No Fall Risks History of fall(s)  Fall risk Follow up  Falls evaluation completed Falls evaluation completed     SUMMARY AND PLAN:  Medicare annual wellness visit, subsequent  Chronic pain of left knee   Discussed applicable health maintenance/preventive health measures and advised and referred or ordered per patient preferences:  Health Maintenance  Topic Date Due   Zoster Vaccines- Shingrix (2 of 2) 09/01/2020, she had not been back for the 2nd does, plans to go after discussing today. [facility].    COVID-19 Vaccine (4 - 2023-24 season) 12/15/2021, she was not aware of the boosters, discussed. She can do at the  pharmacy if she decides to get it.    Medicare Annual Wellness (AWV)  04/27/2023   PAP SMEAR-Modifier  11/25/2023   DTaP/Tdap/Td (2 - Td or Tdap) 02/08/2025   Pneumonia Vaccine 67+ Years old  Completed   INFLUENZA VACCINE  Completed   DEXA SCAN  Completed   HPV VACCINES  Aged Out    Education and counseling on the following was provided based on the above review of health and a plan/checklist for the patient, along with additional information discussed, was provided for the patient in the patient instructions :  -Provided counseling and plan for difficulty hearing discussed/referral to audiology or ENT if applicable per above screening. She declined referral at this point as feels hearing issues are minimal and unchaged.  -Provided counseling and plan for increased risk of falling if applicable per above screening. (Referral for PT, community based exercise programs, etc.) She would liekly benefit from PT. Discussed the knee issues/physical activity limitations at length. She wants to get back in with the orthopedic doctor and agrees to ask about doing PT there or advised we can order if she prefers. She wants to check with ortho first. In interim discussed options for chair exercises, tylenol and topical sports creams for pain as needed.  -Provided counseling and plan for function difficulties/ difficulties with ADLs if applicable per above screening. -Advised and counseled on maintaining healthy weight and healthy lifestyle - including the importance of a health diet, regular physical activity, social connections and stress management. -Advised and counseled on a whole foods based healthy diet and regular exercise: discussed a heart healthy whole foods based diet at length. A summary of a healthy diet was provided in the Patient Instructions..  -Advise yearly dental visits at minimum and regular eye exams   Follow up: see patient instructions     Patient Instructions  I really enjoyed  getting to talk with you today! I am available on Tuesdays and Thursdays for virtual visits if you have any questions or concerns, or if I can be of any further assistance.   CHECKLIST FROM ANNUAL WELLNESS VISIT:  -Follow up (please call to schedule if not scheduled after visit):  -Inperson visit with your Primary Doctor office: every 3-6 months and as needed. Please schedule visit with Dr. Elease Hashimoto or with Orthopedics to follow up on the knee. I think physical therapy may be a good idea if you can.  -yearly for annual wellness visit with primary care office  Here is a list of your preventive care/health maintenance measures and the plan for each if any are due:  Health Maintenance  Topic Date Due   Zoster Vaccines- Shingrix (2 of 2) 09/01/2020; please get your second does at the pharmacy   COVID-19 Vaccine (4 - 2023-24 season) 12/15/2021, this will likely be due yearly like the flu shot. Can get at the pharmacy.   Medicare Annual Wellness (AWV)  04/27/2023   PAP SMEAR-Modifier  11/25/2023   DTaP/Tdap/Td (2 - Td or Tdap) 02/08/2025   Pneumonia Vaccine 23+ Years old  Completed   INFLUENZA VACCINE  Completed   DEXA SCAN  Completed   HPV VACCINES  Aged Out    -See a dentist at least yearly  -Get your eyes checked and then per your eye specialist's recommendations  -Other issues addressed today:  -I have included below further information regarding a healthy whole foods based diet, physical activity guidelines for adults, stress management and opportunities for social connections. I hope you find this information useful.     NUTRITION: -eat real food: lots of colorful vegetables (half the plate) and fruits -5-7 servings of vegetables and  fruits per day (fresh or steamed is best), exp. 2 servings of vegetables with lunch and dinner and 2 servings of fruit per day. Berries and greens such as kale and collards are great choices.  -consume on a regular basis: whole grains (make sure first ingredient on label contains the word "whole"), fresh fruits, fish, nuts, seeds, healthy oils (such as olive oil, avocado oil, grape seed oil) -may eat small amounts of dairy and lean meat on occasion, but avoid processed meats such as ham, bacon, lunch meat, etc. -drink water -try to avoid fast food and pre-packaged foods, processed meat -most experts advise limiting sodium to < '2300mg'$  per day, should limit further is any chronic conditions such as high blood pressure, heart disease, diabetes, etc. The American Heart Association advised that < '1500mg'$  is is ideal -try to avoid foods that contain any ingredients with names you do not recognize  -try to avoid sugar/sweets (except for the natural sugar that occurs in fresh fruit) -try to avoid sweet drinks -try to avoid white rice, white bread, pasta (unless whole grain), white or yellow potatoes  EXERCISE GUIDELINES FOR ADULTS: -if you wish to increase your physical activity, do so gradually and with the approval of your doctor - it might be good to do physical therapy help figure out ways to exercise with the knee issues -STOP and seek medical care immediately if you have any chest pain, chest discomfort or trouble breathing when starting or increasing exercise  -move and stretch your body, legs, feet and arms when sitting for long periods -Physical activity guidelines for optimal health in adults: -least 150 minutes per week of aerobic exercise (can talk, but not sing) once approved by your doctor, 20-30 minutes of sustained activity or two 10 minute episodes of sustained activity every day.  -resistance training at least 2 days per week if approved by your doctor -balance exercises 3+ days per  week:   Stand somewhere where you have something sturdy to hold onto if you lose balance.    1) lift up on toes, start with 5x per day and work up to 20x   2) stand and lift on leg straight out to the side so that foot is a few inches of the floor, start with 5x each side and work up to 20x each side   3) stand on one foot, start with 5 seconds each side and work up to 20 seconds on each side  If you need ideas or help with getting more active:  -Silver sneakers https://tools.silversneakers.com  -Walk with a Doc: http://stephens-thompson.biz/  -try to include resistance (weight lifting/strength building) and balance exercises twice per week: or the following link for ideas: ChessContest.fr  UpdateClothing.com.cy  STRESS MANAGEMENT: -can try meditating, or just sitting  quietly with deep breathing while intentionally relaxing all parts of your body for 5 minutes daily -if you need further help with stress, anxiety or depression please follow up with your primary doctor or contact the wonderful folks at [place]: Christine: -options in [place] if you wish to engage in more social and exercise related activities:  -Silver sneakers https://tools.silversneakers.com  -Walk with a Doc: http://stephens-thompson.biz/  -Check out the [place] 50+ section on the [employer] (hiking clubs, book clubs, cards and games, chess, exercise classes, aquatic classes and much more) - see the website for details: https://www.-[place].gov/departments/parks-recreation/active-adults50  -YouTube has lots of exercise videos for different ages and abilities as well  -[place] (a variety of indoor and outdoor inperson activities for adults). 269-584-0083. [address].  -Virtual Online Classes (a variety of topics): see  seniorplanet.org or call (772) 511-7919  -consider volunteering at a school, hospice center, church, senior center or elsewhere           Lucretia Kern, DO

## 2022-05-07 ENCOUNTER — Telehealth: Payer: Self-pay

## 2022-05-07 ENCOUNTER — Telehealth: Payer: Medicare Other

## 2022-05-07 NOTE — Telephone Encounter (Signed)
   CCM RN Visit Note   05/07/22 Name: [name] MRN: [id number]      DOB: 1940/11/15  Subjective: [name] is a 82 y.o. year old female who is a primary care patient of Burchette, Alinda Sierras, MD. The patient was referred to the Chronic Care Management team for assistance with care management needs subsequent to provider initiation of CCM services and plan of care.      Brief outreach with [name] regarding scheduled outreach today. Reports traveling to a medical appointment at the time of the call. Will update team with preferred date for rescheduled outreach.  PLAN: Assessment will be rescheduled   [place] Manager/Chronic Care Management [phone]

## 2022-05-11 ENCOUNTER — Telehealth: Payer: Self-pay | Admitting: Pharmacist

## 2022-05-11 NOTE — Progress Notes (Unsigned)
Care Management & Coordination Services Pharmacy Team  Reason for Encounter: Medication coordination and delivery  Contacted patient to discuss medications and coordinate delivery from [facility]. {US HC Outreach:28874}  Cycle dispensing form sent to *** for review.   Last adherence delivery date:04/20/2022      Patient is due for next adherence delivery on: 05/22/2022  This delivery to include: Adherence Packaging  30 Days  Womens 50+ MVI - take 1 tablet at bedtime Tamoxifen 20 mg - take 1 tablet at breakfast Levothyroxine 50 mcg - take 1 tablet before breakfast Furosemide 20 mg  - take 1 tablet at breakfast Atorvastatin 20 mg - take 1 tablet at bedtime Esomeprazole 40 mg - take 1 tablet at breakfast Metoprolol Tartrate 25 mg - take 1 tablet at breakfast and  bedtime Amlodipine 5 mg - take 1 tablet at breakfast Losartan HCTZ 100/12.5 mg - take 1 tablet at breakfast  Patient declined the following medications this month: ***  {Delivery QAST:41962}  Any concerns about your medications? {yes/no:20286}  How often do you forget or accidentally miss a dose? {Missed doses:25554}  Is patient in packaging Yes  What is the date on your next pill pack?  Any concerns or issues with your packaging?   Recent blood pressure readings are as follows:***  Chart review:  Recent office visits:  04/26/2022 Colin Benton DO - Patient was seen for Medicare annual wellness visit and and additional concern. No medication changes.   Recent consult visits:  None  Hospital visits:  None  Medications: Outpatient Encounter Medications as of 05/11/2022  Medication Sig   amLODipine (NORVASC) 5 MG tablet TAKE ONE TABLET BY MOUTH EVERY MORNING   atorvastatin (LIPITOR) 20 MG tablet Take 1 tablet (20 mg total) by mouth daily.   esomeprazole (NEXIUM) 40 MG capsule Take 1 capsule (40 mg total) by mouth daily.   furosemide (LASIX) 20 MG tablet TAKE ONE TABLET BY MOUTH EVERY MORNING    HYDROcodone-acetaminophen (NORCO/VICODIN) 5-325 MG tablet Take 1 tablet by mouth every 6 (six) hours as needed for moderate pain.   levothyroxine (SYNTHROID) 50 MCG tablet TAKE ONE TABLET BY MOUTH BEFORE BREAKFAST   losartan-hydrochlorothiazide (HYZAAR) 100-12.5 MG tablet TAKE ONE TABLET BY MOUTH EVERY MORNING   Melatonin 3 MG TBDP Take by mouth.   metoprolol tartrate (LOPRESSOR) 25 MG tablet TAKE ONE TABLET BY MOUTH EVERY MORNING and TAKE ONE TABLET BY MOUTH EVERYDAY AT BEDTIME   omeprazole (PRILOSEC) 40 MG capsule Take 40 mg by mouth daily.   potassium chloride SA (KLOR-CON M) 20 MEQ tablet Take 1 tablet (20 mEq total) by mouth 2 (two) times daily.   tamoxifen (NOLVADEX) 20 MG tablet Take 1 tablet (20 mg total) by mouth daily.   traZODone (DESYREL) 50 MG tablet Take 50 mg by mouth at bedtime.   TURMERIC PO Take 500 mg by mouth daily.    Facility-Administered Encounter Medications as of 05/11/2022  Medication   betamethasone acetate-betamethasone sodium phosphate (CELESTONE) injection 12 mg   BP Readings from Last 3 Encounters:  03/28/22 138/68  03/19/22 (!) 152/59  03/05/22 138/60    Pulse Readings from Last 3 Encounters:  03/28/22 66  03/19/22 70  03/05/22 94    Lab Results  Component Value Date/Time   HGBA1C 5.8 (A) 03/05/2022 02:13 PM   HGBA1C 6.3 10/23/2021 02:36 PM   HGBA1C 5.8 11/15/2014 09:24 AM   Lab Results  Component Value Date   CREATININE 0.70 03/19/2022   BUN 14 03/19/2022   GFR  50.88 (L) 03/05/2022   GFRNONAA >60 01/30/2022   GFRAA >60 03/26/2018   NA 146 (H) 03/19/2022   K 3.5 03/19/2022   CALCIUM 10.1 03/05/2022   CO2 26 03/05/2022     [place] Pharmacist Assistant [phone]

## 2022-05-17 ENCOUNTER — Other Ambulatory Visit: Payer: Self-pay

## 2022-05-18 MED ORDER — ESOMEPRAZOLE MAGNESIUM 40 MG PO CPDR: 40.0000 mg | DELAYED_RELEASE_CAPSULE | Freq: Every day | ORAL | 0 refills | Status: DC

## 2022-05-18 NOTE — Telephone Encounter (Signed)
Patient informed of the message and rx sent

## 2022-05-22 ENCOUNTER — Ambulatory Visit (INDEPENDENT_AMBULATORY_CARE_PROVIDER_SITE_OTHER): Payer: Medicare Other

## 2022-05-22 ENCOUNTER — Telehealth: Payer: Medicare Other

## 2022-05-22 DIAGNOSIS — I10 Essential (primary) hypertension: Secondary | ICD-10-CM | POA: Diagnosis not present

## 2022-05-22 DIAGNOSIS — E785 Hyperlipidemia, unspecified: Secondary | ICD-10-CM

## 2022-05-22 NOTE — Patient Instructions (Signed)
Thank you for allowing the Chronic Care Management team to participate in your care. It was great speaking with you!   Following is a copy of the CCM Program Consent:  CCM service includes personalized support from designated clinical staff supervised by the physician, including individualized plan of care and coordination with other care providers 24/7 contact phone numbers for assistance for urgent and routine care needs. Service will only be billed when office clinical staff spend 20 minutes or more in a month to coordinate care. Only one practitioner may furnish and bill the service in a calendar month. The patient may stop CCM services at amy time (effective at the end of the month) by phone call to the office staff. The patient will be responsible for cost sharing (co-pay) or up to 20% of the service fee (after annual deductible is met)  Following is a copy of your full provider care plan:   Goals Addressed             This Visit's Progress    Goal: CCM (Hyperlipidemia) Expected Outcome:  Monitor, Self-Manage And Reduce Symptoms of Hyperlipidemia       Current Barriers:  Chronic Disease Management support and education needs related to Hyperlipidemia  Planned Interventions: Reviewed plan for management of cholesterol. Reviewed medications. Reports taking atorvastatin as prescribed. Advised to notify provider with concerns related to statin tolerance. Reviewed provider established cholesterol goals. Counseled on importance of regular laboratory monitoring as prescribed. Reviewed role and benefits of statin for ASCVD risk reduction. Discussed strategies to manage statin-induced myalgias. Reviewed importance of limiting foods high in cholesterol. Reviewed exercise goals and target of 150 minutes per week. Advised to engage in low impact activities/exercises as tolerated.   Lab Results  Component Value Date   CHOL 154 08/02/2021   HDL 47.80 08/02/2021   LDLDIRECT 74.0  08/02/2021   TRIG 325.0 (H) 08/02/2021   CHOLHDL 3 08/02/2021     Symptom Management: Take medications as prescribed   Attend all scheduled provider appointments Call pharmacy for medication refills 3-7 days in advance of running out of medications Call provider office for new concerns or questions  Call doctor with any symptoms you believe are related to your medicine   Follow Up Plan:  Will follow up in three months       Goal: CCM (Hypertension) Expected Outcome:  Monitor, Self-Manage And Reduce Symptoms of Hypertension       Current Barriers:  Chronic Disease Management support and education needs related to Hypertension.  Planned Interventions: Reviewed current treatment plan related to Hypertension, self-management, and adherence to plan as established by provider.  Reviewed medications and indications for use. Reports taking medications as prescribed. Denies concerns r/t medication management or prescription cost. Provided information regarding established blood pressure parameters along with indications for notifying a provider. Reports not monitoring blood pressure consistently. Advised to monitor BP  and record readings.  Reviewed symptoms. Denies chest pain or palpitations. Denies headaches, dizziness, or visual changes.  Discussed compliance with recommended cardiac prudent diet. Encouraged to read nutrition labels, continue monitoring sodium intake, and avoid highly processed foods when possible.  Reviewed s/sx of heart attack, stroke and worsening symptoms that require immediate medical attention.    BP Readings from Last 3 Encounters:  03/28/22 138/68  03/19/22 (!) 152/59  03/05/22 138/60     Symptom Management: Take all medications as prescribed Attend all scheduled provider appointments Call pharmacy for medication refills 3-7 days in advance of running out of medications  Check blood pressure  Keep a blood pressure log Call doctor for signs and symptoms of  high blood pressure Continue engaging in exercises and mild activity as tolerated Read nutrition labels. Eat whole grains, fruits and vegetables, lean meats and healthy fats Limit salt intake  Call provider office for new concerns or questions    Follow Up Plan:  Will follow up in three months          [name] verbalized understanding of instructions and care plan provided. Agreed to view in [place].   A member of the care management team will follow up in three months.   Stephanie Latino RN Care Manager/Chronic Care Management [phone]

## 2022-05-22 NOTE — Plan of Care (Signed)
Chronic Care Management Provider Comprehensive Care Plan    05/22/2022 Name: [name] MRN: [id number] DOB: 12-07-1940  Referral to Chronic Care Management (CCM) services was placed by Provider:  [name] on Date: 02/22/22.   Chronic Condition 1: HTN Provider Assessment and Plan  Hypertension much improved with recent change of losartan to losartan/HCTZ. Blood pressure improved by home readings and reading here today as well  Expected Outcome/Goals Addressed This Visit (Provider CCM goals/Provider Assessment and plan  Goal: CCM (Hypertension) Expected Outcome:  Monitor, Self-Manage And Reduce Symptoms of Hypertension  Symptom Management Condition 1: Take all medications as prescribed Attend all scheduled provider appointments Call pharmacy for medication refills 3-7 days in advance of running out of medications Check blood pressure  Keep a blood pressure log Call doctor for signs and symptoms of high blood pressure Continue engaging in exercises and mild activity as tolerated Read nutrition labels. Eat whole grains, fruits and vegetables, lean meats and healthy fats Limit salt intake  Call provider office for new concerns or questions     Chronic Condition 2: HLD Provider Assessment and Plan  Hyperlipidemia treated with simvastatin   Expected Outcome/Goals Addressed This Visit (Provider CCM goals/Provider Assessment and plan  Goal: CCM (Hyperlipidemia) Expected Outcome:  Monitor, Self-Manage And Reduce Symptoms of Hyperlipidemia  Symptom Management Condition 2: Take medications as prescribed   Attend all scheduled provider appointments Call pharmacy for medication refills 3-7 days in advance of running out of medications Call provider office for new concerns or questions  Call doctor with any symptoms you believe are related to your medicine    Problem List Patient Active Problem List   Diagnosis Date Noted   Cognitive impairment 10/04/2021   (HFpEF)  heart failure with preserved ejection fraction ([place]) 10/25/2020   Fusion of spine of thoracolumbar region 02/21/2018   Chronic midline low back pain with bilateral sciatica 11/06/2016   Genetic testing 09/23/2015   Malignant neoplasm of upper-outer quadrant of left breast in female, estrogen receptor positive ([place]) 09/07/2015   Family history of breast cancer    Family history of colon cancer    Prediabetes 08/30/2014   Endometriosis    Osteopenia    Hypertension    UTI (lower urinary tract infection) 03/05/2011   Dysuria 03/05/2011   Hypothyroidism 06/06/2009   FATIGUE 06/06/2009   ELEVATED BLOOD PRESSURE 06/06/2009   Disorder of bone and cartilage 09/09/2008   SCOLIOSIS 09/09/2008   Hyperlipidemia 07/30/2008   Migraine headache 07/30/2008   GERD 07/30/2008   ENTHESOPATHY OF HIP REGION 07/30/2008   HEADACHE 07/30/2008    Medication Management  Current Outpatient Medications:    amLODipine (NORVASC) 5 MG tablet, TAKE ONE TABLET BY MOUTH EVERY MORNING, Disp: 90 tablet, Rfl: 3   atorvastatin (LIPITOR) 20 MG tablet, Take 1 tablet (20 mg total) by mouth daily., Disp: 90 tablet, Rfl: 3   esomeprazole (NEXIUM) 40 MG capsule, Take 1 capsule (40 mg total) by mouth daily., Disp: 90 capsule, Rfl: 0   furosemide (LASIX) 20 MG tablet, TAKE ONE TABLET BY MOUTH EVERY MORNING, Disp: 90 tablet, Rfl: 3   HYDROcodone-acetaminophen (NORCO/VICODIN) 5-325 MG tablet, Take 1 tablet by mouth every 6 (six) hours as needed for moderate pain., Disp: , Rfl:    levothyroxine (SYNTHROID) 50 MCG tablet, TAKE ONE TABLET BY MOUTH BEFORE BREAKFAST, Disp: 90 tablet, Rfl: 3   losartan-hydrochlorothiazide (HYZAAR) 100-12.5 MG tablet, TAKE ONE TABLET BY MOUTH EVERY MORNING, Disp: 90 tablet, Rfl: 3   Melatonin 3 MG  TBDP, Take by mouth., Disp: , Rfl:    metoprolol tartrate (LOPRESSOR) 25 MG tablet, TAKE ONE TABLET BY MOUTH EVERY MORNING and TAKE ONE TABLET BY MOUTH EVERYDAY AT BEDTIME, Disp: 180 tablet, Rfl: 3    omeprazole (PRILOSEC) 40 MG capsule, Take 40 mg by mouth daily., Disp: , Rfl:    potassium chloride SA (KLOR-CON M) 20 MEQ tablet, Take 1 tablet (20 mEq total) by mouth 2 (two) times daily., Disp: 10 tablet, Rfl: 0   tamoxifen (NOLVADEX) 20 MG tablet, Take 1 tablet (20 mg total) by mouth daily., Disp: 30 tablet, Rfl: 12   traZODone (DESYREL) 50 MG tablet, Take 50 mg by mouth at bedtime., Disp: , Rfl:    TURMERIC PO, Take 500 mg by mouth daily. , Disp: , Rfl:   Current Facility-Administered Medications:    betamethasone acetate-betamethasone sodium phosphate (CELESTONE) injection 12 mg, 12 mg, Other, Once, Magnus Sinning, MD  Cognitive Assessment Identity Confirmed: : Name; DOB Cognitive Status: Normal   Functional Assessment Hearing Difficulty or Deaf: no Wear Glasses or Blind: yes Vision Management: Readers Concentrating, Remembering or Making Decisions Difficulty (CP): no Difficulty Communicating: no Difficulty Eating/Swallowing: no Walking or Climbing Stairs Difficulty: no Dressing/Bathing Difficulty: no Doing Errands Independently Difficulty (such as shopping) (CP): no Change in Functional Status Since Onset of Current Illness/Injury: no   Caregiver Assessment  Primary Source of Support/Comfort: spouse People in Home: spouse Family Caregiver if Needed: none   Planned Interventions  Hypertension Reviewed current treatment plan related to Hypertension, self-management, and adherence to plan as established by provider.  Reviewed medications and indications for use. Reports taking medications as prescribed. Denies concerns r/t medication management or prescription cost. Provided information regarding established blood pressure parameters along with indications for notifying a provider. Reports not monitoring blood pressure consistently. Advised to monitor BP  and record readings.  Reviewed symptoms. Denies chest pain or palpitations. Denies headaches, dizziness, or visual  changes.  Discussed compliance with recommended cardiac prudent diet. Encouraged to read nutrition labels, continue monitoring sodium intake, and avoid highly processed foods when possible.  Reviewed s/sx of heart attack, stroke and worsening symptoms that require immediate medical attention.   Hyperlipidemia Reviewed plan for management of cholesterol. Reviewed medications. Reports taking atorvastatin as prescribed. Advised to notify provider with concerns related to statin tolerance. Reviewed provider established cholesterol goals. Counseled on importance of regular laboratory monitoring as prescribed. Reviewed role and benefits of statin for ASCVD risk reduction. Discussed strategies to manage statin-induced myalgias. Reviewed importance of limiting foods high in cholesterol. Reviewed exercise goals and target of 150 minutes per week. Advised to engage in low impact activities/exercises as tolerated.    Interaction and coordination with outside resources, practitioners, and providers See CCM Referral  Care Plan: Available in MyChart

## 2022-05-22 NOTE — Chronic Care Management (AMB) (Signed)
Chronic Care Management   CCM RN Visit Note  05/22/2022 Name: [name] [id number] MRN: [id number] DOB: 02-02-1941  Subjective: [name] is a 82 y.o. year old female who is a primary care patient of Default, Provider, MD. The patient was referred to the Chronic Care Management team for assistance with care management needs subsequent to provider initiation of CCM services and plan of care.    Today's Visit:  Engaged with patient by telephone for initial visit.     SDOH Interventions Today    Flowsheet Row Most Recent Value  SDOH Interventions   Food Insecurity Interventions Intervention Not Indicated  Housing Interventions Intervention Not Indicated  Transportation Interventions Intervention Not Indicated  Utilities Interventions Intervention Not Indicated  Alcohol Usage Interventions Intervention Not Indicated (Score <7)  Financial Strain Interventions Intervention Not Indicated  Stress Interventions Intervention Not Indicated  Social Connections Interventions Intervention Not Indicated         Goals Addressed             This Visit's Progress    Goal: CCM (Hyperlipidemia) Expected Outcome:  Monitor, Self-Manage And Reduce Symptoms of Hyperlipidemia       Current Barriers:  Chronic Disease Management support and education needs related to Hyperlipidemia  Planned Interventions: Reviewed plan for management of cholesterol. Reviewed medications. Reports taking atorvastatin as prescribed. Advised to notify provider with concerns related to statin tolerance. Reviewed provider established cholesterol goals. Counseled on importance of regular laboratory monitoring as prescribed. Reviewed role and benefits of statin for ASCVD risk reduction. Discussed strategies to manage statin-induced myalgias. Reviewed importance of limiting foods high in cholesterol. Reviewed exercise goals and target of 150 minutes per week. Advised to engage in low impact activities/exercises  as tolerated.   Lab Results  Component Value Date   CHOL 154 08/02/2021   HDL 47.80 08/02/2021   LDLDIRECT 74.0 08/02/2021   TRIG 325.0 (H) 08/02/2021   CHOLHDL 3 08/02/2021     Symptom Management: Take medications as prescribed   Attend all scheduled provider appointments Call pharmacy for medication refills 3-7 days in advance of running out of medications Call provider office for new concerns or questions  Call doctor with any symptoms you believe are related to your medicine   Follow Up Plan:  Will follow up in three months       Goal: CCM (Hypertension) Expected Outcome:  Monitor, Self-Manage And Reduce Symptoms of Hypertension       Current Barriers:  Chronic Disease Management support and education needs related to Hypertension.  Planned Interventions: Reviewed current treatment plan related to Hypertension, self-management, and adherence to plan as established by provider.  Reviewed medications and indications for use. Reports taking medications as prescribed. Denies concerns r/t medication management or prescription cost. Provided information regarding established blood pressure parameters along with indications for notifying a provider. Reports not monitoring blood pressure consistently. Advised to monitor BP  and record readings.  Reviewed symptoms. Denies chest pain or palpitations. Denies headaches, dizziness, or visual changes.  Discussed compliance with recommended cardiac prudent diet. Encouraged to read nutrition labels, continue monitoring sodium intake, and avoid highly processed foods when possible.  Reviewed s/sx of heart attack, stroke and worsening symptoms that require immediate medical attention.    BP Readings from Last 3 Encounters:  03/28/22 138/68  03/19/22 (!) 152/59  03/05/22 138/60     Symptom Management: Take all medications as prescribed Attend all scheduled provider appointments Call pharmacy for medication refills 3-7 days in advance  of running out of medications Check blood pressure  Keep a blood pressure log Call doctor for signs and symptoms of high blood pressure Continue engaging in exercises and mild activity as tolerated Read nutrition labels. Eat whole grains, fruits and vegetables, lean meats and healthy fats Limit salt intake  Call provider office for new concerns or questions    Follow Up Plan:  Will follow up in three months         PLAN: A member of the care management team will follow up in three months.    Horris Latino RN Care Manager/Chronic Care Management [phone]

## 2022-06-08 ENCOUNTER — Telehealth: Payer: Self-pay

## 2022-06-08 NOTE — Progress Notes (Signed)
Care Management & Coordination Services Pharmacy Team  Reason for Encounter: Medication coordination and delivery  Contacted patient to discuss medications and coordinate delivery from [facility]. Spoke with patient on 06/08/2022   Cycle dispensing form sent to [facility] for review.   Last adherence delivery date:05/22/2022      Patient is due for next adherence delivery on: 06/20/2022  This delivery to include: Adherence Packaging  30 Days  Womens 50+ MVI - take 1 tablet at bedtime Tamoxifen 20 mg - take 1 tablet at breakfast Levothyroxine 50 mcg - take 1 tablet before breakfast Furosemide 20 mg  - take 1 tablet at breakfast Atorvastatin 20 mg - take 1 tablet at bedtime Esomeprazole 40 mg - take 1 tablet at breakfast Metoprolol Tartrate 25 mg - take 1 tablet at breakfast and  bedtime Amlodipine 5 mg - take 1 tablet at breakfast Losartan HCTZ 100/12.5 mg - take 1 tablet at breakfast  Patient declined the following medications this month: No declined medications  Confirmed delivery date of 06/20/2022, advised patient that pharmacy will contact them the morning of delivery.  Any concerns about your medications? Patient denies  How often do you forget or accidentally miss a dose? Patient denies any missed doses  Is patient in packaging Yes   What is the date on your next pill pack?06/08/2022 (she just got up and has not taken any medications yet today.   Any concerns or issues with your packaging? Patient denies  Recent blood pressure readings are as follows: Patient denies checking blood pressures recently.   Chart review: Recent office visits:  None  Recent consult visits:  None  Hospital visits:  None  Medications: Outpatient Encounter Medications as of 06/08/2022  Medication Sig   amLODipine (NORVASC) 5 MG tablet TAKE ONE TABLET BY MOUTH EVERY MORNING   atorvastatin (LIPITOR) 20 MG tablet Take 1 tablet (20 mg total) by mouth daily.   esomeprazole  (NEXIUM) 40 MG capsule Take 1 capsule (40 mg total) by mouth daily.   furosemide (LASIX) 20 MG tablet TAKE ONE TABLET BY MOUTH EVERY MORNING   HYDROcodone-acetaminophen (NORCO/VICODIN) 5-325 MG tablet Take 1 tablet by mouth every 6 (six) hours as needed for moderate pain.   levothyroxine (SYNTHROID) 50 MCG tablet TAKE ONE TABLET BY MOUTH BEFORE BREAKFAST   losartan-hydrochlorothiazide (HYZAAR) 100-12.5 MG tablet TAKE ONE TABLET BY MOUTH EVERY MORNING   Melatonin 3 MG TBDP Take by mouth.   metoprolol tartrate (LOPRESSOR) 25 MG tablet TAKE ONE TABLET BY MOUTH EVERY MORNING and TAKE ONE TABLET BY MOUTH EVERYDAY AT BEDTIME   omeprazole (PRILOSEC) 40 MG capsule Take 40 mg by mouth daily.   potassium chloride SA (KLOR-CON M) 20 MEQ tablet Take 1 tablet (20 mEq total) by mouth 2 (two) times daily.   tamoxifen (NOLVADEX) 20 MG tablet Take 1 tablet (20 mg total) by mouth daily.   traZODone (DESYREL) 50 MG tablet Take 50 mg by mouth at bedtime.   TURMERIC PO Take 500 mg by mouth daily.    Facility-Administered Encounter Medications as of 06/08/2022  Medication   betamethasone acetate-betamethasone sodium phosphate (CELESTONE) injection 12 mg   BP Readings from Last 3 Encounters:  03/28/22 138/68  03/19/22 (!) 152/59  03/05/22 138/60    Pulse Readings from Last 3 Encounters:  03/28/22 66  03/19/22 70  03/05/22 94    Lab Results  Component Value Date/Time   HGBA1C 5.8 (A) 03/05/2022 02:13 PM   HGBA1C 6.3 10/23/2021 02:36 PM   HGBA1C  5.8 11/15/2014 09:24 AM   Lab Results  Component Value Date   CREATININE 0.70 03/19/2022   BUN 14 03/19/2022   GFR 50.88 (L) 03/05/2022   GFRNONAA >60 01/30/2022   GFRAA >60 03/26/2018   NA 146 (H) 03/19/2022   K 3.5 03/19/2022   CALCIUM 10.1 03/05/2022   CO2 26 03/05/2022   [place] Pharmacist Assistant [phone]

## 2022-06-28 ENCOUNTER — Telehealth: Payer: Self-pay | Admitting: Family Medicine

## 2022-06-28 ENCOUNTER — Encounter: Payer: Self-pay | Admitting: Family Medicine

## 2022-06-28 DIAGNOSIS — Z1231 Encounter for screening mammogram for malignant neoplasm of breast: Secondary | ICD-10-CM

## 2022-06-28 NOTE — Telephone Encounter (Signed)
Pt call and stated she need a diagnostic order.

## 2022-06-29 ENCOUNTER — Ambulatory Visit: Payer: Medicare Other | Admitting: Family Medicine

## 2022-06-29 NOTE — Telephone Encounter (Signed)
error 

## 2022-06-29 NOTE — Telephone Encounter (Signed)
I spoke with the patient for clarification on what type of diagnostic order she is requesting and patient is unsure of what she needs specifically. Patient reported that she will find out and give [place] a call.

## 2022-07-02 ENCOUNTER — Ambulatory Visit (INDEPENDENT_AMBULATORY_CARE_PROVIDER_SITE_OTHER): Payer: Medicare Other | Admitting: Family Medicine

## 2022-07-02 ENCOUNTER — Encounter: Payer: Self-pay | Admitting: Family Medicine

## 2022-07-02 VITALS — BP 108/58 | HR 66 | Temp 98.0°F | Wt 150.6 lb

## 2022-07-02 DIAGNOSIS — R1010 Upper abdominal pain, unspecified: Secondary | ICD-10-CM | POA: Diagnosis not present

## 2022-07-02 DIAGNOSIS — N6311 Unspecified lump in the right breast, upper outer quadrant: Secondary | ICD-10-CM

## 2022-07-02 MED ORDER — FAMOTIDINE 40 MG PO TABS: 40.0000 mg | ORAL_TABLET | Freq: Every day | ORAL | 0 refills | Status: DC

## 2022-07-02 NOTE — Progress Notes (Signed)
   Subjective:    Patient ID: [name], female    DOB: 1940-05-12, 82 y.o.   MRN: [id number]  HPI Here with her husband to for 2 issues. First she has had intermittent pain in the upper abdomen after eating for the past 4 months. Sometimes she also has nausea, but she has not vomited. No fever. Her appetite is nomal. Her BM's are normal. She had a hysteroscopy and D and C per Dr. Talbert Nan on 03-19-22. She had a CT of the abdomen and pelvis on 10-04-21 which showed a gastric ulcer and a normal gall bladder. She then had a CT on 01-30-22 which showed a normal stomach and gall bladder with no mention of an ulcer. According to her chart she should be taking Nexium 40 mg every morning, but she is not sure if she istaking this or not. The second issue is she found a tender lump in the right breast 3 days ago. She is S/P a lumpectomy in the left breast, and she is taking Tamoxifen.    Review of Systems  Constitutional: Negative.   Respiratory: Negative.    Cardiovascular: Negative.   Gastrointestinal:  Positive for abdominal pain and nausea. Negative for abdominal distention, blood in stool, constipation, diarrhea, rectal pain and vomiting.  Genitourinary: Negative.        Objective:   Physical Exam Constitutional:      Appearance: Normal appearance.  Cardiovascular:     Rate and Rhythm: Normal rate and regular rhythm.     Pulses: Normal pulses.     Heart sounds: Normal heart sounds.  Pulmonary:     Effort: Pulmonary effort is normal.     Breath sounds: Normal breath sounds.     Comments: The left breast and axilla are clear. The right breast appears normal, but there is a firm mobile tender lump in the breast about 2 cm from the nipple at 3 o'clock. The right axilla is clear  Neurological:     Mental Status: She is alert.           Assessment & Plan:  Her upper abdominal pain is most likely related to gastritis or an ulcer, so I asked her to make sure she is taking Nexium  every morning. We will add Pepcid 40 mg every evening before supper. The breast lump seems to be a fibrocystic area, but to be certain we will set her up for bilateral mammogram and right breast US.  Alysia Penna, MD

## 2022-07-04 ENCOUNTER — Other Ambulatory Visit: Payer: Medicare Other

## 2022-07-09 ENCOUNTER — Telehealth: Payer: Self-pay

## 2022-07-09 NOTE — Progress Notes (Signed)
Care Management & Coordination Services Pharmacy Team  Reason for Encounter: Medication coordination and delivery  Contacted patient to discuss medications and coordinate delivery from [facility]. Spoke with patient on 07/09/2022   Cycle dispensing form sent to [facility] for review.   Last adherence delivery date:06/20/2022      Patient is due for next adherence delivery on: 07/19/2022  This delivery to include: Adherence Packaging  30 Days  Womens 50+ MVI - take 1 tablet at bedtime Tamoxifen 20 mg - take 1 tablet at breakfast Levothyroxine 50 mcg - take 1 tablet before breakfast Furosemide 20 mg  - take 1 tablet at breakfast Atorvastatin 20 mg - take 1 tablet at bedtime Esomeprazole 40 mg - take 1 tablet at breakfast Metoprolol Tartrate 25 mg - take 1 tablet at breakfast and  bedtime Amlodipine 5 mg - take 1 tablet at breakfast Losartan HCTZ 100/12.5 mg - take 1 tablet at breakfast Famotidine 40 mg - take 1 tablet before dinner  Patient declined the following medications this month: No declined medications   Confirmed delivery date of 07/19/2022, advised patient that pharmacy will contact them the morning of delivery.  Any concerns about your medications? Patient denies   How often do you forget or accidentally miss a dose? Patient denies any missed doses.  Is patient in packaging Yes  If yes  What is the date on your next pill pack? 07/09/2022 PM doses  Any concerns or issues with your packaging?  Recent blood pressure readings are as follows: Patient denies checking blood pressures recently.   Chart review: Recent office visits:  07/02/2022 Orbie Pyo MD - Patient was seen for Mass of upper outer quadrant of right breast and an additional concern. Started Famotidine 40 mg daily and Discontinued Omeprazole.   Recent consult visits:  None  Hospital visits:  None  Medications: Outpatient Encounter Medications as of 07/09/2022  Medication Sig    amLODipine (NORVASC) 5 MG tablet TAKE ONE TABLET BY MOUTH EVERY MORNING   atorvastatin (LIPITOR) 20 MG tablet Take 1 tablet (20 mg total) by mouth daily.   esomeprazole (NEXIUM) 40 MG capsule Take 1 capsule (40 mg total) by mouth daily.   famotidine (PEPCID) 40 MG tablet Take 1 tablet (40 mg total) by mouth daily before supper.   furosemide (LASIX) 20 MG tablet TAKE ONE TABLET BY MOUTH EVERY MORNING   HYDROcodone-acetaminophen (NORCO/VICODIN) 5-325 MG tablet Take 1 tablet by mouth every 6 (six) hours as needed for moderate pain.   levothyroxine (SYNTHROID) 50 MCG tablet TAKE ONE TABLET BY MOUTH BEFORE BREAKFAST   losartan-hydrochlorothiazide (HYZAAR) 100-12.5 MG tablet TAKE ONE TABLET BY MOUTH EVERY MORNING   Melatonin 3 MG TBDP Take by mouth.   metoprolol tartrate (LOPRESSOR) 25 MG tablet TAKE ONE TABLET BY MOUTH EVERY MORNING and TAKE ONE TABLET BY MOUTH EVERYDAY AT BEDTIME   potassium chloride SA (KLOR-CON M) 20 MEQ tablet Take 1 tablet (20 mEq total) by mouth 2 (two) times daily.   tamoxifen (NOLVADEX) 20 MG tablet Take 1 tablet (20 mg total) by mouth daily.   traZODone (DESYREL) 50 MG tablet Take 50 mg by mouth at bedtime.   TURMERIC PO Take 500 mg by mouth daily.    Facility-Administered Encounter Medications as of 07/09/2022  Medication   betamethasone acetate-betamethasone sodium phosphate (CELESTONE) injection 12 mg   BP Readings from Last 3 Encounters:  07/02/22 (!) 108/58  03/28/22 138/68  03/19/22 (!) 152/59    Pulse Readings from Last 3 Encounters:  07/02/22 66  03/28/22 66  03/19/22 70    Lab Results  Component Value Date/Time   HGBA1C 5.8 (A) 03/05/2022 02:13 PM   HGBA1C 6.3 10/23/2021 02:36 PM   HGBA1C 5.8 11/15/2014 09:24 AM   Lab Results  Component Value Date   CREATININE 0.70 03/19/2022   BUN 14 03/19/2022   GFR 50.88 (L) 03/05/2022   GFRNONAA >60 01/30/2022   GFRAA >60 03/26/2018   NA 146 (H) 03/19/2022   K 3.5 03/19/2022   CALCIUM 10.1 03/05/2022   CO2  26 03/05/2022   [place] Pharmacist Assistant [phone]

## 2022-07-16 ENCOUNTER — Other Ambulatory Visit: Payer: Self-pay | Admitting: Family Medicine

## 2022-07-17 ENCOUNTER — Other Ambulatory Visit: Payer: Self-pay | Admitting: Family

## 2022-07-17 MED ORDER — FAMOTIDINE 40 MG PO TABS: ORAL_TABLET | ORAL | 1 refills | Status: DC

## 2022-08-08 ENCOUNTER — Telehealth: Payer: Self-pay

## 2022-08-08 NOTE — Progress Notes (Signed)
Care Management & Coordination Services Pharmacy Team  Reason for Encounter: Medication coordination and delivery  Contacted patient to discuss medications and coordinate delivery from [facility]. Spoke with patient on 08/08/2022   Cycle dispensing form sent to [facility] for review.   Last adherence delivery date:07/19/2022      Patient is due for next adherence delivery on: 08/20/2022  This delivery to include: Adherence Packaging  30 Days  Womens 50+ MVI - take 1 tablet at bedtime Tamoxifen 20 mg - take 1 tablet at breakfast Levothyroxine 50 mcg - take 1 tablet before breakfast Furosemide 20 mg  - take 1 tablet at breakfast Atorvastatin 20 mg - take 1 tablet at bedtime Esomeprazole 40 mg - take 1 tablet at breakfast Metoprolol Tartrate 25 mg - take 1 tablet at breakfast and  bedtime Amlodipine 5 mg - take 1 tablet at breakfast Losartan HCTZ 100/12.5 mg - take 1 tablet at breakfast Famotidine 40 mg - take 1 tablet before dinner  Patient declined the following medications this month: No declined medications   Confirmed delivery date of 08/20/2022, advised patient that pharmacy will contact them the morning of delivery.  Any concerns about your medications? Patient denies  How often do you forget or accidentally miss a dose? Patient denies  Is patient in packaging Yes  If yes  What is the date on your next pill pack? 08/08/22  Any concerns or issues with your packaging? Patient denies   Recent blood pressure readings are as follows: Patient states she has not been checking blood pressures for a while, she has been quite busy and just hasn't remembered to do this.   Chart review: Recent office visits:  None  Recent consult visits:  None  Hospital visits:  None  Medications: Outpatient Encounter Medications as of 08/08/2022  Medication Sig   amLODipine (NORVASC) 5 MG tablet TAKE ONE TABLET BY MOUTH EVERY MORNING   atorvastatin (LIPITOR) 20 MG tablet  Take 1 tablet (20 mg total) by mouth daily.   esomeprazole (NEXIUM) 40 MG capsule Take 1 capsule (40 mg total) by mouth daily.   famotidine (PEPCID) 40 MG tablet TAKE ONE TABLET BY MOUTH ONCE DAILY BEFORE SUPPER   furosemide (LASIX) 20 MG tablet TAKE ONE TABLET BY MOUTH EVERY MORNING   HYDROcodone-acetaminophen (NORCO/VICODIN) 5-325 MG tablet Take 1 tablet by mouth every 6 (six) hours as needed for moderate pain.   levothyroxine (SYNTHROID) 50 MCG tablet TAKE ONE TABLET BY MOUTH BEFORE BREAKFAST   losartan-hydrochlorothiazide (HYZAAR) 100-12.5 MG tablet TAKE ONE TABLET BY MOUTH EVERY MORNING   Melatonin 3 MG TBDP Take by mouth.   metoprolol tartrate (LOPRESSOR) 25 MG tablet TAKE ONE TABLET BY MOUTH EVERY MORNING and TAKE ONE TABLET BY MOUTH EVERYDAY AT BEDTIME   potassium chloride SA (KLOR-CON M) 20 MEQ tablet Take 1 tablet (20 mEq total) by mouth 2 (two) times daily.   tamoxifen (NOLVADEX) 20 MG tablet Take 1 tablet (20 mg total) by mouth daily.   traZODone (DESYREL) 50 MG tablet Take 50 mg by mouth at bedtime.   TURMERIC PO Take 500 mg by mouth daily.    Facility-Administered Encounter Medications as of 08/08/2022  Medication   betamethasone acetate-betamethasone sodium phosphate (CELESTONE) injection 12 mg   BP Readings from Last 3 Encounters:  07/02/22 (!) 108/58  03/28/22 138/68  03/19/22 (!) 152/59    Pulse Readings from Last 3 Encounters:  07/02/22 66  03/28/22 66  03/19/22 70    Lab Results  Component  Value Date/Time   HGBA1C 5.8 (A) 03/05/2022 02:13 PM   HGBA1C 6.3 10/23/2021 02:36 PM   HGBA1C 5.8 11/15/2014 09:24 AM   Lab Results  Component Value Date   CREATININE 0.70 03/19/2022   BUN 14 03/19/2022   GFR 50.88 (L) 03/05/2022   GFRNONAA >60 01/30/2022   GFRAA >60 03/26/2018   NA 146 (H) 03/19/2022   K 3.5 03/19/2022   CALCIUM 10.1 03/05/2022   CO2 26 03/05/2022   Inetta Fermo CMA  Clinical Pharmacist Assistant [phone]

## 2022-08-15 ENCOUNTER — Ambulatory Visit (INDEPENDENT_AMBULATORY_CARE_PROVIDER_SITE_OTHER): Payer: Medicare Other | Admitting: Family Medicine

## 2022-08-15 ENCOUNTER — Encounter: Payer: Self-pay | Admitting: Family Medicine

## 2022-08-15 VITALS — BP 126/68 | HR 77 | Temp 98.1°F | Wt 144.8 lb

## 2022-08-15 DIAGNOSIS — R1010 Upper abdominal pain, unspecified: Secondary | ICD-10-CM | POA: Diagnosis not present

## 2022-08-15 NOTE — Progress Notes (Signed)
   Subjective:    Patient ID: [name], female    DOB: Jun 23, 1940, 82 y.o.   MRN: [id number]  HPI Here with her husband for 3 weeks of intermittent pains in the upper abdomen with nausea and occasional vomiting. Her BM's are normal. No urinary symptoms or fever. She finds that eating food will make these symptoms worse. She takes Omeprazole and Famotidine every day for GERD. She had a CT of the abdomen and pelvis on 01-30-22 that showed a normal gall bladder but she had severe hepatic steatosis.    Review of Systems  Constitutional: Negative.   Respiratory: Negative.    Cardiovascular: Negative.   Gastrointestinal:  Positive for abdominal pain and nausea. Negative for abdominal distention, blood in stool, constipation, diarrhea and vomiting.  Genitourinary: Negative.        Objective:   Physical Exam Constitutional:      Appearance: Normal appearance. She is not ill-appearing.  Cardiovascular:     Rate and Rhythm: Normal rate and regular rhythm.     Pulses: Normal pulses.     Heart sounds: Normal heart sounds.  Pulmonary:     Effort: Pulmonary effort is normal.     Breath sounds: Normal breath sounds.  Abdominal:     General: Abdomen is flat. Bowel sounds are normal. There is no distension.     Palpations: Abdomen is soft. There is no mass.     Tenderness: There is no right CVA tenderness, left CVA tenderness, guarding or rebound.     Hernia: No hernia is present.     Comments: She is tender in the epigastrium and the RUQ   Neurological:     Mental Status: She is alert.           Assessment & Plan:  Upper abdominal pain with nausea. Gallstones would be a possible etiology. We will set up an abdominal US soon, and we will get labs today to check CBC, liver panel, etc. In the meantime she will avoid fatty foods and dairy products.  Gershon Crane, MD

## 2022-08-16 ENCOUNTER — Other Ambulatory Visit: Payer: Self-pay | Admitting: Family Medicine

## 2022-08-17 ENCOUNTER — Telehealth: Payer: Self-pay

## 2022-08-17 NOTE — Telephone Encounter (Signed)
Rx sent 

## 2022-08-17 NOTE — Telephone Encounter (Signed)
-----   Message from Sherrill Raring, [facility] sent at 08/17/2022  9:42 AM EDT ----- Regarding: Med Refill [facility] requesting refill on behalf of pt for following medication:  Esomeprazole 40mg  1 capsule daily  Pharmacy Info: [facility] - [place], [place] - [address]  Phone: [phone] Fax: [phone]   Thank you! Sherrill Raring Clinical Pharmacist [phone]

## 2022-08-21 ENCOUNTER — Inpatient Hospital Stay (HOSPITAL_COMMUNITY)
Admission: EM | Admit: 2022-08-21 | Discharge: 2022-08-24 | DRG: 378 | Disposition: A | Discharge status: Home / Self Care | Payer: Medicare Other | Attending: Family Medicine | Admitting: Family Medicine

## 2022-08-21 ENCOUNTER — Ambulatory Visit: Payer: Medicare Other | Admitting: Family

## 2022-08-21 ENCOUNTER — Emergency Department (HOSPITAL_COMMUNITY): Payer: Medicare Other

## 2022-08-21 ENCOUNTER — Other Ambulatory Visit: Payer: Self-pay

## 2022-08-21 ENCOUNTER — Encounter (HOSPITAL_COMMUNITY): Payer: Self-pay | Admitting: Pharmacy Technician

## 2022-08-21 DIAGNOSIS — Z853 Personal history of malignant neoplasm of breast: Secondary | ICD-10-CM

## 2022-08-21 DIAGNOSIS — Z882 Allergy status to sulfonamides status: Secondary | ICD-10-CM | POA: Diagnosis not present

## 2022-08-21 DIAGNOSIS — Z9049 Acquired absence of other specified parts of digestive tract: Secondary | ICD-10-CM

## 2022-08-21 DIAGNOSIS — Z8249 Family history of ischemic heart disease and other diseases of the circulatory system: Secondary | ICD-10-CM

## 2022-08-21 DIAGNOSIS — K760 Fatty (change of) liver, not elsewhere classified: Secondary | ICD-10-CM | POA: Diagnosis present

## 2022-08-21 DIAGNOSIS — R7303 Prediabetes: Secondary | ICD-10-CM | POA: Diagnosis present

## 2022-08-21 DIAGNOSIS — N179 Acute kidney failure, unspecified: Secondary | ICD-10-CM | POA: Diagnosis present

## 2022-08-21 DIAGNOSIS — Z79899 Other long term (current) drug therapy: Secondary | ICD-10-CM | POA: Diagnosis not present

## 2022-08-21 DIAGNOSIS — Z823 Family history of stroke: Secondary | ICD-10-CM

## 2022-08-21 DIAGNOSIS — Z833 Family history of diabetes mellitus: Secondary | ICD-10-CM

## 2022-08-21 DIAGNOSIS — E876 Hypokalemia: Secondary | ICD-10-CM | POA: Diagnosis present

## 2022-08-21 DIAGNOSIS — E038 Other specified hypothyroidism: Secondary | ICD-10-CM | POA: Diagnosis not present

## 2022-08-21 DIAGNOSIS — E785 Hyperlipidemia, unspecified: Secondary | ICD-10-CM | POA: Diagnosis present

## 2022-08-21 DIAGNOSIS — Z8719 Personal history of other diseases of the digestive system: Secondary | ICD-10-CM

## 2022-08-21 DIAGNOSIS — K5909 Other constipation: Secondary | ICD-10-CM | POA: Diagnosis present

## 2022-08-21 DIAGNOSIS — N183 Chronic kidney disease, stage 3 unspecified: Secondary | ICD-10-CM | POA: Diagnosis present

## 2022-08-21 DIAGNOSIS — R1010 Upper abdominal pain, unspecified: Secondary | ICD-10-CM | POA: Diagnosis not present

## 2022-08-21 DIAGNOSIS — D509 Iron deficiency anemia, unspecified: Secondary | ICD-10-CM | POA: Diagnosis not present

## 2022-08-21 DIAGNOSIS — I10 Essential (primary) hypertension: Secondary | ICD-10-CM | POA: Diagnosis present

## 2022-08-21 DIAGNOSIS — K253 Acute gastric ulcer without hemorrhage or perforation: Secondary | ICD-10-CM

## 2022-08-21 DIAGNOSIS — R195 Other fecal abnormalities: Secondary | ICD-10-CM

## 2022-08-21 DIAGNOSIS — D62 Acute posthemorrhagic anemia: Secondary | ICD-10-CM | POA: Diagnosis not present

## 2022-08-21 DIAGNOSIS — N1831 Chronic kidney disease, stage 3a: Secondary | ICD-10-CM | POA: Diagnosis not present

## 2022-08-21 DIAGNOSIS — I130 Hypertensive heart and chronic kidney disease with heart failure and stage 1 through stage 4 chronic kidney disease, or unspecified chronic kidney disease: Secondary | ICD-10-CM | POA: Diagnosis not present

## 2022-08-21 DIAGNOSIS — K254 Chronic or unspecified gastric ulcer with hemorrhage: Secondary | ICD-10-CM | POA: Diagnosis not present

## 2022-08-21 DIAGNOSIS — E039 Hypothyroidism, unspecified: Secondary | ICD-10-CM | POA: Diagnosis present

## 2022-08-21 DIAGNOSIS — R1011 Right upper quadrant pain: Secondary | ICD-10-CM | POA: Diagnosis not present

## 2022-08-21 DIAGNOSIS — Z9221 Personal history of antineoplastic chemotherapy: Secondary | ICD-10-CM

## 2022-08-21 DIAGNOSIS — R509 Fever, unspecified: Secondary | ICD-10-CM | POA: Diagnosis present

## 2022-08-21 DIAGNOSIS — R1084 Generalized abdominal pain: Secondary | ICD-10-CM | POA: Diagnosis not present

## 2022-08-21 DIAGNOSIS — I5032 Chronic diastolic (congestive) heart failure: Secondary | ICD-10-CM | POA: Diagnosis present

## 2022-08-21 DIAGNOSIS — R17 Unspecified jaundice: Secondary | ICD-10-CM | POA: Diagnosis not present

## 2022-08-21 DIAGNOSIS — R109 Unspecified abdominal pain: Secondary | ICD-10-CM | POA: Diagnosis not present

## 2022-08-21 DIAGNOSIS — D500 Iron deficiency anemia secondary to blood loss (chronic): Secondary | ICD-10-CM

## 2022-08-21 DIAGNOSIS — K3189 Other diseases of stomach and duodenum: Secondary | ICD-10-CM | POA: Diagnosis not present

## 2022-08-21 DIAGNOSIS — F0390 Unspecified dementia without behavioral disturbance: Secondary | ICD-10-CM | POA: Diagnosis present

## 2022-08-21 DIAGNOSIS — K922 Gastrointestinal hemorrhage, unspecified: Principal | ICD-10-CM

## 2022-08-21 DIAGNOSIS — R110 Nausea: Secondary | ICD-10-CM | POA: Diagnosis not present

## 2022-08-21 DIAGNOSIS — Z79890 Hormone replacement therapy (postmenopausal): Secondary | ICD-10-CM

## 2022-08-21 DIAGNOSIS — R531 Weakness: Secondary | ICD-10-CM | POA: Diagnosis not present

## 2022-08-21 DIAGNOSIS — Z923 Personal history of irradiation: Secondary | ICD-10-CM | POA: Diagnosis not present

## 2022-08-21 DIAGNOSIS — K219 Gastro-esophageal reflux disease without esophagitis: Secondary | ICD-10-CM | POA: Diagnosis not present

## 2022-08-21 DIAGNOSIS — Z888 Allergy status to other drugs, medicaments and biological substances status: Secondary | ICD-10-CM | POA: Diagnosis not present

## 2022-08-21 DIAGNOSIS — Z800 Family history of malignant neoplasm of digestive organs: Secondary | ICD-10-CM | POA: Diagnosis not present

## 2022-08-21 DIAGNOSIS — Z87442 Personal history of urinary calculi: Secondary | ICD-10-CM

## 2022-08-21 DIAGNOSIS — I509 Heart failure, unspecified: Secondary | ICD-10-CM | POA: Diagnosis not present

## 2022-08-21 DIAGNOSIS — M810 Age-related osteoporosis without current pathological fracture: Secondary | ICD-10-CM | POA: Diagnosis not present

## 2022-08-21 DIAGNOSIS — K838 Other specified diseases of biliary tract: Secondary | ICD-10-CM | POA: Diagnosis not present

## 2022-08-21 DIAGNOSIS — K259 Gastric ulcer, unspecified as acute or chronic, without hemorrhage or perforation: Secondary | ICD-10-CM | POA: Diagnosis not present

## 2022-08-21 DIAGNOSIS — R55 Syncope and collapse: Secondary | ICD-10-CM | POA: Diagnosis present

## 2022-08-21 DIAGNOSIS — R413 Other amnesia: Secondary | ICD-10-CM | POA: Diagnosis not present

## 2022-08-21 DIAGNOSIS — Z803 Family history of malignant neoplasm of breast: Secondary | ICD-10-CM

## 2022-08-21 LAB — URINALYSIS, ROUTINE W REFLEX MICROSCOPIC
Bilirubin Urine: NEGATIVE
Glucose, UA: NEGATIVE mg/dL
Hgb urine dipstick: NEGATIVE
Ketones, ur: NEGATIVE mg/dL
Nitrite: NEGATIVE
Protein, ur: NEGATIVE mg/dL
Specific Gravity, Urine: 1.015 (ref 1.005–1.030)
pH: 5 (ref 5.0–8.0)

## 2022-08-21 LAB — RETICULOCYTES
Immature Retic Fract: 21.9 % — ABNORMAL HIGH (ref 2.3–15.9)
RBC.: 1.87 MIL/uL — ABNORMAL LOW (ref 3.87–5.11)
Retic Count, Absolute: 71.4 10*3/uL (ref 19.0–186.0)
Retic Ct Pct: 3.8 % — ABNORMAL HIGH (ref 0.4–3.1)

## 2022-08-21 LAB — COMPREHENSIVE METABOLIC PANEL
ALT: 18 U/L (ref 0–44)
AST: 17 U/L (ref 15–41)
Albumin: 3 g/dL — ABNORMAL LOW (ref 3.5–5.0)
Alkaline Phosphatase: 43 U/L (ref 38–126)
Anion gap: 11 (ref 5–15)
BUN: 20 mg/dL (ref 8–23)
CO2: 22 mmol/L (ref 22–32)
Calcium: 8.3 mg/dL — ABNORMAL LOW (ref 8.9–10.3)
Chloride: 104 mmol/L (ref 98–111)
Creatinine, Ser: 1.01 mg/dL — ABNORMAL HIGH (ref 0.44–1.00)
GFR, Estimated: 56 mL/min — ABNORMAL LOW (ref >60)
Glucose, Bld: 119 mg/dL — ABNORMAL HIGH (ref 70–99)
Potassium: 3.2 mmol/L — ABNORMAL LOW (ref 3.5–5.1)
Sodium: 137 mmol/L (ref 135–145)
Total Bilirubin: 0.1 mg/dL — ABNORMAL LOW (ref 0.3–1.2)
Total Protein: 5.5 g/dL — ABNORMAL LOW (ref 6.5–8.1)

## 2022-08-21 LAB — CBC WITH DIFFERENTIAL/PLATELET
Abs Immature Granulocytes: 0.03 10*3/uL (ref 0.00–0.07)
Basophils Absolute: 0 10*3/uL (ref 0.0–0.1)
Basophils Relative: 1 %
Eosinophils Absolute: 0.1 10*3/uL (ref 0.0–0.5)
Eosinophils Relative: 1 %
HCT: 14.4 % — ABNORMAL LOW (ref 36.0–46.0)
Hemoglobin: 4.2 g/dL — CL (ref 12.0–15.0)
Immature Granulocytes: 1 %
Lymphocytes Relative: 18 %
Lymphs Abs: 1.1 10*3/uL (ref 0.7–4.0)
MCH: 22.7 pg — ABNORMAL LOW (ref 26.0–34.0)
MCHC: 29.2 g/dL — ABNORMAL LOW (ref 30.0–36.0)
MCV: 77.8 fL — ABNORMAL LOW (ref 80.0–100.0)
Monocytes Absolute: 0.7 10*3/uL (ref 0.1–1.0)
Monocytes Relative: 11 %
Neutro Abs: 4.3 10*3/uL (ref 1.7–7.7)
Neutrophils Relative %: 68 %
Platelets: 299 10*3/uL (ref 150–400)
RBC: 1.85 MIL/uL — ABNORMAL LOW (ref 3.87–5.11)
RDW: 15.9 % — ABNORMAL HIGH (ref 11.5–15.5)
WBC: 6.2 10*3/uL (ref 4.0–10.5)
nRBC: 0 % (ref 0.0–0.2)

## 2022-08-21 LAB — TYPE AND SCREEN: Unit division: 0

## 2022-08-21 LAB — FERRITIN: Ferritin: 6 ng/mL — ABNORMAL LOW (ref 11–307)

## 2022-08-21 LAB — VITAMIN B12: Vitamin B-12: 446 pg/mL (ref 180–914)

## 2022-08-21 LAB — PREPARE RBC (CROSSMATCH)

## 2022-08-21 LAB — IRON AND TIBC
Iron: 6 ug/dL — ABNORMAL LOW (ref 28–170)
Saturation Ratios: 2 % — ABNORMAL LOW (ref 10.4–31.8)
TIBC: 384 ug/dL (ref 250–450)
UIBC: 378 ug/dL

## 2022-08-21 LAB — PROTIME-INR
INR: 1.1 (ref 0.8–1.2)
Prothrombin Time: 14.1 seconds (ref 11.4–15.2)

## 2022-08-21 LAB — LIPASE, BLOOD: Lipase: 50 U/L (ref 11–51)

## 2022-08-21 LAB — BPAM RBC
ISSUE DATE / TIME: 202404211415
Unit Type and Rh: 9500

## 2022-08-21 LAB — HEMOGLOBIN AND HEMATOCRIT, BLOOD
HCT: 14.8 % — ABNORMAL LOW (ref 36.0–46.0)
Hemoglobin: 4.4 g/dL — CL (ref 12.0–15.0)

## 2022-08-21 LAB — POC OCCULT BLOOD, ED: Fecal Occult Bld: POSITIVE — AB

## 2022-08-21 LAB — FOLATE: Folate: >40 ng/mL (ref >5.9)

## 2022-08-21 LAB — APTT: aPTT: 20 seconds — ABNORMAL LOW (ref 24–36)

## 2022-08-21 MED ORDER — ONDANSETRON HCL 4 MG PO TABS: 4.0000 mg | ORAL_TABLET | Freq: Four times a day (QID) | ORAL | Status: DC | PRN

## 2022-08-21 MED ORDER — LEVOTHYROXINE SODIUM 50 MCG PO TABS
50.0000 ug | ORAL_TABLET | Freq: Every day | ORAL | Status: DC
  Administered 2022-08-23 – 2022-08-24 (×2): 50 ug via ORAL
  Filled 2022-08-21 (×2): qty 1

## 2022-08-21 MED ORDER — ALBUTEROL SULFATE (2.5 MG/3ML) 0.083% IN NEBU: 2.5000 mg | INHALATION_SOLUTION | Freq: Four times a day (QID) | RESPIRATORY_TRACT | Status: DC | PRN

## 2022-08-21 MED ORDER — SODIUM CHLORIDE 0.9% IV SOLUTION: Freq: Once | INTRAVENOUS | Status: AC

## 2022-08-21 MED ORDER — ACETAMINOPHEN 650 MG RE SUPP: 650.0000 mg | Freq: Four times a day (QID) | RECTAL | Status: DC | PRN

## 2022-08-21 MED ORDER — PANTOPRAZOLE INFUSION (NEW) - SIMPLE MED
8.0000 mg/h | INTRAVENOUS | Status: DC
  Administered 2022-08-21 – 2022-08-23 (×4): 8 mg/h via INTRAVENOUS
  Filled 2022-08-21 (×5): qty 100

## 2022-08-21 MED ORDER — ACETAMINOPHEN 325 MG PO TABS
650.0000 mg | ORAL_TABLET | Freq: Four times a day (QID) | ORAL | Status: DC | PRN
  Filled 2022-08-21: qty 2

## 2022-08-21 MED ORDER — TAMOXIFEN CITRATE 10 MG PO TABS
20.0000 mg | ORAL_TABLET | Freq: Every day | ORAL | Status: DC
  Administered 2022-08-24: 20 mg via ORAL
  Filled 2022-08-21 (×3): qty 2

## 2022-08-21 MED ORDER — ONDANSETRON HCL 4 MG/2ML IJ SOLN: 4.0000 mg | Freq: Four times a day (QID) | INTRAMUSCULAR | Status: DC | PRN

## 2022-08-21 MED ORDER — SODIUM CHLORIDE 0.9% FLUSH
3.0000 mL | Freq: Two times a day (BID) | INTRAVENOUS | Status: DC
  Administered 2022-08-23 – 2022-08-24 (×4): 3 mL via INTRAVENOUS

## 2022-08-21 MED ORDER — ATORVASTATIN CALCIUM 10 MG PO TABS
20.0000 mg | ORAL_TABLET | Freq: Every day | ORAL | Status: DC
  Administered 2022-08-23 – 2022-08-24 (×2): 20 mg via ORAL
  Filled 2022-08-21 (×3): qty 2

## 2022-08-21 MED ORDER — PANTOPRAZOLE 80MG IVPB - SIMPLE MED
80.0000 mg | Freq: Once | INTRAVENOUS | Status: AC
  Administered 2022-08-21: 80 mg via INTRAVENOUS
  Filled 2022-08-21: qty 100

## 2022-08-21 MED ORDER — POTASSIUM CHLORIDE CRYS ER 20 MEQ PO TBCR
40.0000 meq | EXTENDED_RELEASE_TABLET | ORAL | Status: AC
  Administered 2022-08-21: 40 meq via ORAL
  Filled 2022-08-21: qty 2

## 2022-08-21 MED ORDER — PANTOPRAZOLE SODIUM 40 MG IV SOLR: 40.0000 mg | Freq: Two times a day (BID) | INTRAVENOUS | Status: DC

## 2022-08-21 NOTE — H&P (Signed)
History and Physical    Patient: [name] [id number] DOB: Oct 31, 1940 DOA: 08/21/2022 DOS: the patient was seen and examined on 08/21/2022 PCP: Kristian Covey, MD  Patient coming from: Home via EMS  Chief Complaint: Abdominal pain  HPI: [name] is a 82 y.o. female with medical history significant of hypertension, hyperlipidemia, hypothyroidism, left breast cancer s/p lumpectomy with radiation, endometriosis, memory loss, and GERD who presents with complaints of right upper quadrant abdominal pain.  History is obtained from patient with assistance of her husband and son who are present at bedside.  Husband is that the patient has short-term memory loss for which she has not been formally diagnosed, but cannot recall being told something 5 minutes ago.  Husband also reports that she is intermittently asking for her mother and father who are deceased.  For the last 10 days patient has been complaining of abdominal pain.  Patient states pain is constant, but usually worsens after trying to eat.  She had not been able to eat or drink much of anything.  Husband notes associated symptoms of nausea and vomiting.  Emesis was reported to be brown in color, but patient denied having any blood present.  She reports having normal stools.  Family makes note that the patient takes Dramamine for sleep, Excedrin for pain, and Tums chews.  She is not on any other patient states she only takes 1 Excedrin per day but family is unable to verify.  Last colonoscopy appeared to have been in 2009.  Over the last 3 days pain seem to worsen and she was constantly crying in bed.  They tried to get her up to go to the doctor today but patient had a near syncopal episode after getting up to the steps.  Patient reportedly has had this pain for quite some time, but husband notes that the frequency and duration of symptoms has increased.  In the emergency department patient was noted to be a febrile with  pulse 96-112, and all other vital signs maintained.  Labs significant for hemoglobin 4.2, potassium 3.2, BUN 20, creatinine 1.01, and calcium 8.3.  Stool guaiacs were noted to be positive.  Right upper quadrant ultrasound noted mildly common bile duct.  Patient was typed and screened and ordered 2 units of packed red blood cells.  Patient had also been started on a Protonix drip.  Dr. Rhea Belton of [facility] have been consulted.  Review of Systems: As mentioned in the history of present illness. All other systems reviewed and are negative. Past Medical History:  Diagnosis Date   Anemia    Anxiety    Arthritis    knees   Atrophic vaginitis    Cancer (HCC)    breast cancer - left   Diverticulosis    Endometriosis    Family history of breast cancer    Family history of colon cancer    GERD (gastroesophageal reflux disease)    Headache(784.0)    Heart murmur    as a child only   History of kidney stones    passed stones, no surgery required   History of radiation therapy 01/30/16- 03/02/16   Left Breast 50 Gy in 25 fractions.    Hyperlipidemia    Hypertension    Hypothyroidism    Internal hemorrhoids    Osteopenia    Osteoporosis    Personal history of chemotherapy    Personal history of radiation therapy    Rheumatic fever    age 40  Sleep apnea    " very mild" does not wear CPAP   Thyroid disease    Hypothyroid   Ulcer    UTI (lower urinary tract infection)    Past Surgical History:  Procedure Laterality Date   APPENDECTOMY     BREAST BIOPSY Left 08/19/2015   BREAST LUMPECTOMY Left 09/08/2015   BREAST LUMPECTOMY WITH RADIOACTIVE SEED AND SENTINEL LYMPH NODE BIOPSY Left 09/09/2015   Procedure: LEFT BREAST LUMPECTOMY WITH RADIOACTIVE SEED AND SENTINEL LYMPH NODE BIOPSY;  Surgeon: Avel Peace, MD;  Location: [facility] OR;  Service: General;  Laterality: Left;   COLONOSCOPY     DILATATION & CURETTAGE/HYSTEROSCOPY WITH MYOSURE N/A 10/09/2016   Procedure: DILATATION &  CURETTAGE/HYSTEROSCOPY WITH MYOSURE;  Surgeon: Genia Del, MD;  Location: [facility] ORS;  Service: Gynecology;  Laterality: N/A;  requesting 7:30am in our block  requests one hour   DILATATION & CURETTAGE/HYSTEROSCOPY WITH MYOSURE N/A 03/19/2022   Procedure: DILATATION & CURETTAGE/HYSTEROSCOPY;  Surgeon: Romualdo Bolk, MD;  Location: [facility];  Service: Gynecology;  Laterality: N/A;   ESOPHAGOGASTRODUODENOSCOPY ENDOSCOPY     IR GENERIC HISTORICAL  12/05/2015   IR CV LINE INJECTION 12/05/2015 Jolaine Click, MD [facility]-INTER[facility] RAD   LAPAROSCOPIC ENDOMETRIOSIS FULGURATION  1978   PELVIC LAPAROSCOPY     PORTACATH PLACEMENT N/A 10/04/2015   Procedure: INSERTION PORT-A-CATH WITH ULTRASOUND;  Surgeon: Avel Peace, MD;  Location: [facility] OR;  Service: General;  Laterality: N/A;   portacath removal     TONSILLECTOMY     ULNAR NERVE REPAIR  2010   Social History:  reports that she has never smoked. She has never used smokeless tobacco. She reports that she does not drink alcohol and does not use drugs.  Allergies  Allergen Reactions   Nitrofurantoin Nausea And Vomiting    GI upset   Sulfamethoxazole-Trimethoprim Nausea And Vomiting and Other (See Comments)    GI upset    Family History  Problem Relation Age of Onset   Breast cancer Mother        Age 97   Hypertension Mother    Heart disease Father    Stroke Father    Colon cancer Father        dx 34s   Breast cancer Paternal Grandmother        Age 14   Diabetes Paternal Grandmother    Heart disease Maternal Grandmother     Prior to Admission medications   Medication Sig Start Date End Date Taking? Authorizing Provider  amLODipine (NORVASC) 5 MG tablet TAKE ONE TABLET BY MOUTH EVERY MORNING 03/12/22   Burchette, Elberta Fortis, MD  atorvastatin (LIPITOR) 20 MG tablet Take 1 tablet (20 mg total) by mouth daily. 01/22/22   Burchette, Elberta Fortis, MD  esomeprazole (NEXIUM) 40 MG capsule TAKE ONE CAPSULE BY MOUTH ONCE DAILY 08/17/22    Burchette, Elberta Fortis, MD  famotidine (PEPCID) 40 MG tablet TAKE ONE TABLET BY MOUTH ONCE DAILY BEFORE SUPPER 07/17/22   Worthy Rancher B, FNP  furosemide (LASIX) 20 MG tablet TAKE ONE TABLET BY MOUTH EVERY MORNING 03/12/22   Burchette, Elberta Fortis, MD  HYDROcodone-acetaminophen (NORCO/VICODIN) 5-325 MG tablet Take 1 tablet by mouth every 6 (six) hours as needed for moderate pain.    [provider]  levothyroxine (SYNTHROID) 50 MCG tablet TAKE ONE TABLET BY MOUTH BEFORE BREAKFAST 03/12/22   Burchette, Elberta Fortis, MD  losartan-hydrochlorothiazide (HYZAAR) 100-12.5 MG tablet TAKE ONE TABLET BY MOUTH EVERY MORNING 03/12/22   Burchette, Elberta Fortis, MD  Melatonin 3 MG TBDP Take by mouth. 01/20/18   [provider]  metoprolol tartrate (LOPRESSOR) 25 MG tablet TAKE ONE TABLET BY MOUTH EVERY MORNING and TAKE ONE TABLET BY MOUTH EVERYDAY AT BEDTIME 03/12/22   Burchette, Elberta Fortis, MD  potassium chloride SA (KLOR-CON M) 20 MEQ tablet Take 1 tablet (20 mEq total) by mouth 2 (two) times daily. 10/03/21   Dione Booze, MD  tamoxifen (NOLVADEX) 20 MG tablet Take 1 tablet (20 mg total) by mouth daily. 09/19/21   Rachel Moulds, MD  traZODone (DESYREL) 50 MG tablet Take 50 mg by mouth at bedtime.    [provider]  TURMERIC PO Take 500 mg by mouth daily.     [provider]    Physical Exam: Vitals:   08/21/22 1545 08/21/22 1548 08/21/22 1615 08/21/22 1700  BP:  99/86 (!) 111/91 (!) 147/57  Pulse: (!) 104 (!) 101 (!) 103 (!) 112  Resp: 18 17 17 19   Temp:      TempSrc:      SpO2: 100% 100% 100% 100%   Constitutional: Elderly female who appears to be unwell Eyes: PERRL, lids and conjunctivae normal ENMT: Mucous membranes are moist.  Fair dentition. Neck: normal, supple  Respiratory: clear to auscultation bilaterally, no wheezing, no crackles. Normal respiratory effort. No accessory muscle use.  Cardiovascular: Tachycardic.  No extremity edema. 2+ pedal pulses.    Abdomen: Mild  tenderness noted to deep palpation.  Bowel sounds otherwise present in all 4 quadrants. Musculoskeletal: no clubbing / cyanosis. No joint deformity upper and lower extremities. Good ROM, no contractures. Normal muscle tone.  Skin: Pallor present.  Appreciated. Neurologic: CN 2-12 grossly intact.  Able to move all extremities but generalized weakness appreciated. Psychiatric: Alert and oriented to person and place.  Poor short-term memory as patient repeatedly ask similar questions.  Data Reviewed:   Reviewed labs, imaging, and pertinent records as noted above in HPI.  Assessment and Plan:  Acute blood loss anemia secondary to GI bleed Patient presented with complaints of abdominal pain with nausea and vomiting.  Found to have hemoglobin down to 4.2 with low MCV and MCH.  Hemoglobin previously had been 11.9 on 03/19/2022.  Stool guaiacs were noted to be positive.  Patient did admit to using Excedrin. -Admit to a telemetry bed -Clear liquid diet and n.p.o. after midnight -Follow-up anemia panel -Continue Protonix drip -Serial monitoring of H&H.  Transfuse blood products as needed for hemoglobins less than 7 g/dL -Appreciate GI consultative services we will follow-up for any further recommendations  Right upper quadrant abdominal pain Patient reports having right upper quadrant abdominal pain been present for some time intermittently.  Right upper quadrant U/S noted mildly dilated common bile duct.  However LFTs were noted to be within normal limits. -Consider needed further workup once patient's acute issues have resolved\  Hypokalemia Acute.  Initial potassium 3.2. -Give 40 mEq of potassium chloride p.o. -Continue to monitor and replace as needed  Chronic kidney disease stage IIIa Creatinine elevated up to 1.01 with BUN 20.  Baseline creatinine appears to range from 0.7-1. -Continue to monitor kidney function  Essential hypertension Blood pressures currently maintained. -Held home  blood pressure medications in the setting of acute blood loss.  Resume when medically appropriate  Short-term memory loss Patient has pretty significant short-term memory loss, but husband notes that she has not formally been diagnosed with dementia. -Delirium precautions  Hypothyroidism -Continue levothyroxine  Hyperlipidemia -Continue atorvastatin  History of breast cancer -Continue  tamoxifen  DVT prophylaxis: SCDs Advance Care Planning:   Code Status: Full Code   Consults: [facility]  Family Communication: Family updated at bedside  Severity of Illness: The appropriate patient status for this patient is OBSERVATION. Observation status is judged to be reasonable and necessary in order to provide the required intensity of service to ensure the patient's safety. The patient's presenting symptoms, physical exam findings, and initial radiographic and laboratory data in the context of their medical condition is felt to place them at decreased risk for further clinical deterioration. Furthermore, it is anticipated that the patient will be medically stable for discharge from the hospital within 2 midnights of admission.   Author: Clydie Braun, MD 08/21/2022 5:08 PM  For on call review [email or web address].

## 2022-08-21 NOTE — ED Provider Notes (Signed)
Pt seen by morning team.  Please see their note.  Plan was to follow up on labs and ultrasound.  Pt not in any distress. Confirms that she has been feeling very fatigued. Physical Exam  BP (!) 147/57   Pulse (!) 112   Temp 98.4 F (36.9 C) (Oral)   Resp 19   SpO2 100%   Physical Exam Genitourinary:    Comments: Stool, dark in color    Procedures  .Critical Care  Performed by: Linwood Dibbles, MD Authorized by: Linwood Dibbles, MD   Critical care provider statement:    Critical care time (minutes):  45   Critical care was time spent personally by me on the following activities:  Development of treatment plan with patient or surrogate, discussions with consultants, evaluation of patient's response to treatment, examination of patient, ordering and review of laboratory studies, ordering and review of radiographic studies, ordering and performing treatments and interventions, pulse oximetry, re-evaluation of patient's condition and review of old charts   ED Course / MDM   Clinical Course as of 08/21/22 1711  Tue Aug 21, 2022  1543 Notified hgb 4.2 [JK]  1705 Case discussed with Dr. Rhea Belton, gastroenterology.  They will see patient in consultation [JK]  1711 Discussed with Dr. Katrinka Blazing regarding admission [JK]    Clinical Course User Index [JK] Linwood Dibbles, MD   Medical Decision Making Amount and/or Complexity of Data Reviewed Labs: ordered. Decision-making details documented in ED Course.    Details: Significant for severe anemia Radiology: ordered.    Details: Ultrasound without signs of cholecystitis.  Mildly dilated common bile duct but LFTs are normal.  Doubt clinically significant  Risk Prescription drug management. Decision regarding hospitalization.   Notified that pt's hgb is 4.2.  Pt is very pale on exam.  Rectal exam performed.  Stool is dark in color.  Hemoccult positive.  Concerning for gi bleed.  Discussed findings with pt and family.  Will start on blood transfusions.  IV  Protonix ordered.  Plan on admission to the hospital.       Linwood Dibbles, MD 08/21/22 1711

## 2022-08-21 NOTE — ED Notes (Signed)
ED TO INPATIENT HANDOFF REPORT  ED Nurse Name and Phone #: Yuleimy Kretz [phone]  S Name/Age/Gender [name] 82 y.o. female Room/Bed: 024C/024C  Code Status   Code Status: Full Code  Home/SNF/Other Home Patient oriented to: self, place, and situation Is this baseline? Yes   Triage Complete: Triage complete  Chief Complaint GI bleed [K92.2]  Triage Note Pt bib ems from home with reports of RUQ abd pain and nausea for the last 10 days with worsening. Family reports decreased mobility and increased falls over the last few days. Possible liver/gallbladder issues. BP 130/54 HR 100 96% RA CBG 203   Allergies Allergies  Allergen Reactions   Nitrofurantoin Nausea And Vomiting    GI upset   Sulfamethoxazole-Trimethoprim Nausea And Vomiting and Other (See Comments)    GI upset    Level of Care/Admitting Diagnosis ED Disposition     ED Disposition  Admit   Condition  --   Comment  Hospital Area: [facility] [[id number]]  Level of Care: Telemetry Medical [104]  May place patient in observation at [facility] or [facility] if equivalent level of care is available:: No  Covid Evaluation: Asymptomatic - no recent exposure (last 10 days) testing not required  Diagnosis: GI bleed [960454]  Admitting Physician: Clydie Braun [[id number]]  Attending Physician: Clydie Braun [[id number]]          B Medical/Surgery History Past Medical History:  Diagnosis Date   Anemia    Anxiety    Arthritis    knees   Atrophic vaginitis    Cancer (HCC)    breast cancer - left   Diverticulosis    Endometriosis    Family history of breast cancer    Family history of colon cancer    GERD (gastroesophageal reflux disease)    Headache(784.0)    Heart murmur    as a child only   History of kidney stones    passed stones, no surgery required   History of radiation therapy 01/30/16- 03/02/16   Left Breast 50 Gy in 25 fractions.    Hyperlipidemia    Hypertension     Hypothyroidism    Internal hemorrhoids    Osteopenia    Osteoporosis    Personal history of chemotherapy    Personal history of radiation therapy    Rheumatic fever    age 30   Sleep apnea    " very mild" does not wear CPAP   Thyroid disease    Hypothyroid   Ulcer    UTI (lower urinary tract infection)    Past Surgical History:  Procedure Laterality Date   APPENDECTOMY     BREAST BIOPSY Left 08/19/2015   BREAST LUMPECTOMY Left 09/08/2015   BREAST LUMPECTOMY WITH RADIOACTIVE SEED AND SENTINEL LYMPH NODE BIOPSY Left 09/09/2015   Procedure: LEFT BREAST LUMPECTOMY WITH RADIOACTIVE SEED AND SENTINEL LYMPH NODE BIOPSY;  Surgeon: Avel Peace, MD;  Location: [facility] OR;  Service: General;  Laterality: Left;   COLONOSCOPY     DILATATION & CURETTAGE/HYSTEROSCOPY WITH MYOSURE N/A 10/09/2016   Procedure: DILATATION & CURETTAGE/HYSTEROSCOPY WITH MYOSURE;  Surgeon: Genia Del, MD;  Location: [facility];  Service: Gynecology;  Laterality: N/A;  requesting 7:30am in our block  requests one hour   DILATATION & CURETTAGE/HYSTEROSCOPY WITH MYOSURE N/A 03/19/2022   Procedure: DILATATION & CURETTAGE/HYSTEROSCOPY;  Surgeon: Romualdo Bolk, MD;  Location: [facility];  Service: Gynecology;  Laterality: N/A;   ESOPHAGOGASTRODUODENOSCOPY ENDOSCOPY  IR GENERIC HISTORICAL  12/05/2015   IR CV LINE INJECTION 12/05/2015 Jolaine Click, MD [facility]INTER[facility]   LAPAROSCOPIC ENDOMETRIOSIS FULGURATION  1978   PELVIC LAPAROSCOPY     PORTACATH PLACEMENT N/A 10/04/2015   Procedure: INSERTION PORT-A-CATH WITH ULTRASOUND;  Surgeon: Avel Peace, MD;  Location: [facility] OR;  Service: General;  Laterality: N/A;   portacath removal     TONSILLECTOMY     ULNAR NERVE REPAIR  2010     A IV Location/Drains/Wounds Patient Lines/Drains/Airways Status     Active Line/Drains/Airways     Name Placement date Placement time Site Days   Peripheral IV 03/19/22 20 G 1" Anterior;Right Wrist 03/19/22  0611   Wrist  155   Incision (Closed) 03/19/22 Perineum 03/19/22  0717  -- 155            Intake/Output Last 24 hours No intake or output data in the 24 hours ending 08/21/22 1803  Labs/Imaging Results for orders placed or performed during the hospital encounter of 08/21/22 (from the past 48 hour(s))  Comprehensive metabolic panel     Status: Abnormal   Collection Time: 08/21/22  2:15 PM  Result Value Ref Range   Sodium 137 135 - 145 mmol/L   Potassium 3.2 (L) 3.5 - 5.1 mmol/L   Chloride 104 98 - 111 mmol/L   CO2 22 22 - 32 mmol/L   Glucose, Bld 119 (H) 70 - 99 mg/dL    Comment: Glucose reference range applies only to samples taken after fasting for at least 8 hours.   BUN 20 8 - 23 mg/dL   Creatinine, Ser 1.61 (H) 0.44 - 1.00 mg/dL   Calcium 8.3 (L) 8.9 - 10.3 mg/dL   Total Protein 5.5 (L) 6.5 - 8.1 g/dL   Albumin 3.0 (L) 3.5 - 5.0 g/dL   AST 17 15 - 41 U/L   ALT 18 0 - 44 U/L   Alkaline Phosphatase 43 38 - 126 U/L   Total Bilirubin 0.1 (L) 0.3 - 1.2 mg/dL   GFR, Estimated 56 (L) >60 mL/min    Comment: (NOTE) Calculated using the CKD-EPI Creatinine Equation (2021)    Anion gap 11 5 - 15    Comment: Performed at [facility], [address] [place], [place] [address]  Lipase, blood     Status: None   Collection Time: 08/21/22  2:15 PM  Result Value Ref Range   Lipase 50 11 - 51 U/L    Comment: Performed at [facility], [address], [place], [place] [address]  CBC with Differential     Status: Abnormal   Collection Time: 08/21/22  2:15 PM  Result Value Ref Range   WBC 6.2 4.0 - 10.5 K/uL   RBC 1.85 (L) 3.87 - 5.11 MIL/uL   Hemoglobin 4.2 (LL) 12.0 - 15.0 g/dL    Comment: Reticulocyte Hemoglobin testing may be clinically indicated, consider ordering this additional test [id number] THIS CRITICAL RESULT HAS VERIFIED AND BEEN CALLED TO C. Tayler Lassen, RN BY SHAY EKDAHL ON 05 07 2024 AT 1542, AND HAS BEEN READ BACK. RTV    HCT 14.4 (L) 36.0 - 46.0 %   MCV 77.8 (L)  80.0 - 100.0 fL   MCH 22.7 (L) 26.0 - 34.0 pg   MCHC 29.2 (L) 30.0 - 36.0 g/dL   RDW 95.6 (H) 21.3 - 08.6 %   Platelets 299 150 - 400 K/uL    Comment: REPEATED TO VERIFY   nRBC 0.0 0.0 - 0.2 %  Neutrophils Relative % 68 %   Neutro Abs 4.3 1.7 - 7.7 K/uL   Lymphocytes Relative 18 %   Lymphs Abs 1.1 0.7 - 4.0 K/uL   Monocytes Relative 11 %   Monocytes Absolute 0.7 0.1 - 1.0 K/uL   Eosinophils Relative 1 %   Eosinophils Absolute 0.1 0.0 - 0.5 K/uL   Basophils Relative 1 %   Basophils Absolute 0.0 0.0 - 0.1 K/uL   Immature Granulocytes 1 %   Abs Immature Granulocytes 0.03 0.00 - 0.07 K/uL    Comment: Performed at [facility], [address] [place], [place] [address]  Type and screen [facility]     Status: None (Preliminary result)   Collection Time: 08/21/22  4:05 PM  Result Value Ref Range   ABO/RH(D) O NEG    Antibody Screen NEG    Sample Expiration 08/24/2022,2359    Unit Number [id number]    Blood Component Type RED CELLS,LR    Unit division 00    Status of Unit ALLOCATED    Transfusion Status OK TO TRANSFUSE    Crossmatch Result      Compatible Performed at [facility], [address], [place], [place] [address]    Unit Number [id number]    Blood Component Type RBC LR PHER2    Unit division 00    Status of Unit ALLOCATED    Transfusion Status OK TO TRANSFUSE    Crossmatch Result Compatible   Reticulocytes     Status: Abnormal   Collection Time: 08/21/22  4:05 PM  Result Value Ref Range   Retic Ct Pct 3.8 (H) 0.4 - 3.1 %   RBC. 1.87 (L) 3.87 - 5.11 MIL/uL   Retic Count, Absolute 71.4 19.0 - 186.0 K/uL   Immature Retic Fract 21.9 (H) 2.3 - 15.9 %    Comment: Performed at [facility], [address], [place], [place] [address]  POC occult blood, ED Provider will collect     Status: Abnormal   Collection Time: 08/21/22  4:12 PM  Result Value Ref Range   Fecal Occult Bld POSITIVE (A) NEGATIVE  Prepare RBC (crossmatch)      Status: None   Collection Time: 08/21/22  4:30 PM  Result Value Ref Range   Order Confirmation      ORDER PROCESSED BY BLOOD BANK Performed at [facility], [address] [place], [place] [address]    US Abdomen Limited RUQ (LIVER/GB)  Result Date: 08/21/2022 CLINICAL DATA:  Abdominal pain EXAM: ULTRASOUND ABDOMEN LIMITED RIGHT UPPER QUADRANT COMPARISON:  None Available. FINDINGS: Gallbladder: No gallstones or wall thickening visualized. No sonographic Murphy sign noted by sonographer. Common bile duct: Diameter: 8 mm No intrahepatic biliary ductal dilation. Liver: No focal lesion identified. Increased parenchymal echogenicity. Portal vein is patent on color Doppler imaging with normal direction of blood flow towards the liver. Other: None. IMPRESSION: 1. Mildly dilated common bile duct. Recommend correlation with liver function tests. If liver function tests are abnormal, recommend further evaluation with MRCP. 2. No sonographic evidence of acute cholecystitis. 3. Hepatic steatosis. Electronically Signed   By: Allegra Lai M.D.   On: 08/21/2022 15:31    Pending Labs Unresulted Labs (From admission, onward)     Start     Ordered   08/22/22 0500  CBC  Tomorrow morning,   R        08/21/22 1718   08/22/22 0500  Comprehensive metabolic panel  Tomorrow  morning,   R        08/21/22 1718   08/21/22 2200  Hemoglobin and hematocrit, blood  Once,   R        08/21/22 1721   08/21/22 1730  Protime-INR  Once,   STAT        08/21/22 1730   08/21/22 1730  APTT  Once,   STAT        08/21/22 1730   08/21/22 1609  Vitamin B12  (Anemia Panel (PNL))  Once,   URGENT        08/21/22 1608   08/21/22 1609  Folate  (Anemia Panel (PNL))  Once,   URGENT        08/21/22 1608   08/21/22 1609  Iron and TIBC  (Anemia Panel (PNL))  Once,   URGENT        08/21/22 1608   08/21/22 1609  Ferritin  (Anemia Panel (PNL))  Once,   URGENT        08/21/22 1608   08/21/22 1409  Urinalysis, Routine w reflex  microscopic -Urine, Clean Catch  Once,   URGENT       Question:  Specimen Source  Answer:  Urine, Clean Catch   08/21/22 1408            Vitals/Pain Today's Vitals   08/21/22 1545 08/21/22 1548 08/21/22 1615 08/21/22 1700  BP:  99/86 (!) 111/91 (!) 147/57  Pulse: (!) 104 (!) 101 (!) 103 (!) 112  Resp: 18 17 17 19   Temp:      TempSrc:      SpO2: 100% 100% 100% 100%    Isolation Precautions No active isolations  Medications Medications  0.9 %  sodium chloride infusion (Manually program via Guardrails IV Fluids) (has no administration in time range)  pantoprazole (PROTONIX) 80 mg /NS 100 mL IVPB (80 mg Intravenous New Bag/Given 08/21/22 1749)  pantoprozole (PROTONIX) 80 mg /NS 100 mL infusion (has no administration in time range)  pantoprazole (PROTONIX) injection 40 mg (has no administration in time range)  sodium chloride flush (NS) 0.9 % injection 3 mL (has no administration in time range)  acetaminophen (TYLENOL) tablet 650 mg (has no administration in time range)    Or  acetaminophen (TYLENOL) suppository 650 mg (has no administration in time range)  ondansetron (ZOFRAN) tablet 4 mg (has no administration in time range)    Or  ondansetron (ZOFRAN) injection 4 mg (has no administration in time range)  albuterol (PROVENTIL) (2.5 MG/3ML) 0.083% nebulizer solution 2.5 mg (has no administration in time range)  potassium chloride SA (KLOR-CON M) CR tablet 40 mEq (has no administration in time range)    Mobility walks with person assist     Focused Assessments Cardiac Assessment Handoff:    No results found for: "CKTOTAL", "CKMB", "CKMBINDEX", "TROPONINI" No results found for: "DDIMER" Does the Patient currently have chest pain? No    R Recommendations: See Admitting Provider Note  Report given to:   Additional Notes: pt unable to sign blood consent due to ams. Family has left to go get food.will have them sign consent when they get back.

## 2022-08-21 NOTE — Progress Notes (Signed)
New Admission Note:   Arrival Method: stretcher Mental Orientation: aa+ox2 Telemetry: box 12 Assessment: Completed Skin: C/D/I IV: right antecub and left hand Pain: denies Tubes: N/A Safety Measures: Safety Fall Prevention Plan has been given, discussed and signed Admission: Completed [facility] Orientation: Patient has been orientated to the room, unit and staff.  Family: present  Orders have been reviewed and implemented. Will continue to monitor the patient. Call light has been placed within reach and bed alarm has been activated.   Margarita Grizzle, RN

## 2022-08-21 NOTE — ED Notes (Signed)
Dr. Lynelle Doctor aware of hgb result.

## 2022-08-21 NOTE — ED Provider Notes (Addendum)
[place] [facility] AT [facility] Provider Note   CSN: [id number] Arrival date & time: 08/21/22  1316     History  No chief complaint on file.   [name] is a 82 y.o. female PMH of HFpEF, bilateral lower back sciatica, HLD, GERD, hypothyroidism, HTN, endometriosis, and prediabetes presenting with right upper abdominal pain associated nausea and vomiting.  Describes pain as a sharp intermittent non-radiating pain that is worse after eating.  It is 8/10 when it worse. Denies any blood in stool or blood with emesis. Reports occasional use of aspirin about once a week. No fever, chills, chest pain or diarrhea.    Home Medications Prior to Admission medications   Medication Sig Start Date End Date Taking? Authorizing Provider  amLODipine (NORVASC) 5 MG tablet TAKE ONE TABLET BY MOUTH EVERY MORNING 03/12/22   Burchette, Elberta Fortis, MD  atorvastatin (LIPITOR) 20 MG tablet Take 1 tablet (20 mg total) by mouth daily. 01/22/22   Burchette, Elberta Fortis, MD  esomeprazole (NEXIUM) 40 MG capsule TAKE ONE CAPSULE BY MOUTH ONCE DAILY 08/17/22   Burchette, Elberta Fortis, MD  famotidine (PEPCID) 40 MG tablet TAKE ONE TABLET BY MOUTH ONCE DAILY BEFORE SUPPER 07/17/22   Worthy Rancher B, FNP  furosemide (LASIX) 20 MG tablet TAKE ONE TABLET BY MOUTH EVERY MORNING 03/12/22   Burchette, Elberta Fortis, MD  HYDROcodone-acetaminophen (NORCO/VICODIN) 5-325 MG tablet Take 1 tablet by mouth every 6 (six) hours as needed for moderate pain.    [provider]  levothyroxine (SYNTHROID) 50 MCG tablet TAKE ONE TABLET BY MOUTH BEFORE BREAKFAST 03/12/22   Burchette, Elberta Fortis, MD  losartan-hydrochlorothiazide (HYZAAR) 100-12.5 MG tablet TAKE ONE TABLET BY MOUTH EVERY MORNING 03/12/22   Burchette, Elberta Fortis, MD  Melatonin 3 MG TBDP Take by mouth. 01/20/18   [provider]  metoprolol tartrate (LOPRESSOR) 25 MG tablet TAKE ONE TABLET BY MOUTH EVERY MORNING and TAKE ONE TABLET BY MOUTH EVERYDAY AT  BEDTIME 03/12/22   Burchette, Elberta Fortis, MD  potassium chloride SA (KLOR-CON M) 20 MEQ tablet Take 1 tablet (20 mEq total) by mouth 2 (two) times daily. 10/03/21   Dione Booze, MD  tamoxifen (NOLVADEX) 20 MG tablet Take 1 tablet (20 mg total) by mouth daily. 09/19/21   Rachel Moulds, MD  traZODone (DESYREL) 50 MG tablet Take 50 mg by mouth at bedtime.    [provider]  TURMERIC PO Take 500 mg by mouth daily.     [provider]      Allergies    Nitrofurantoin and Sulfamethoxazole-trimethoprim    Review of Systems   Review of Systems  Constitutional:  Negative for chills, diaphoresis, fatigue and fever.  Respiratory:  Negative for cough, chest tightness and shortness of breath.   Cardiovascular:  Negative for chest pain and leg swelling.  Gastrointestinal:  Positive for abdominal pain, nausea and vomiting. Negative for abdominal distention, blood in stool, constipation and diarrhea.  Genitourinary:  Negative for dysuria, flank pain, frequency and hematuria.  Skin:  Positive for pallor. Negative for rash and wound.  Neurological:  Positive for light-headedness. Negative for weakness.    Physical Exam Updated Vital Signs BP (!) 125/52   Pulse 96   Temp 98.4 F (36.9 C) (Oral)   Resp 12   SpO2 100%  Physical Exam Constitutional:      Appearance: Normal appearance. She is normal weight.  Eyes:     Extraocular Movements: Extraocular movements intact.     Conjunctiva/sclera:  Conjunctivae normal.     Pupils: Pupils are equal, round, and reactive to light.  Cardiovascular:     Rate and Rhythm: Normal rate and regular rhythm.     Pulses: Normal pulses.     Heart sounds: Normal heart sounds.  Pulmonary:     Effort: Pulmonary effort is normal.     Breath sounds: Normal breath sounds.  Abdominal:     General: Abdomen is flat. Bowel sounds are normal. There is no distension.     Palpations: Abdomen is soft.     Tenderness: There is no abdominal tenderness.  Skin:     General: Skin is warm and dry.     Capillary Refill: Capillary refill takes less than 2 seconds.  Neurological:     General: No focal deficit present.     Mental Status: She is alert and oriented to person, place, and time. Mental status is at baseline.  Psychiatric:        Mood and Affect: Mood normal.        Behavior: Behavior normal.     ED Results / Procedures / Treatments   Labs (all labs ordered are listed, but only abnormal results are displayed) Labs Reviewed  COMPREHENSIVE METABOLIC PANEL  LIPASE, BLOOD  CBC WITH DIFFERENTIAL/PLATELET  URINALYSIS, ROUTINE W REFLEX MICROSCOPIC    EKG None  Radiology No results found.  Procedures Procedures    Medications Ordered in ED Medications - No data to display  ED Course/ Medical Decision Making/ A&P Clinical Course as of 08/21/22 1546  Tue Aug 21, 2022  1543 Notified hgb 4.2 [JK]    Clinical Course User Index [JK] Linwood Dibbles, MD    Medical Decision Making Amount and/or Complexity of Data Reviewed Labs: ordered. Decision-making details documented in ED Course.    Details: Presenting with right upper quadrant abdominal pain.  Order labs for CBC, BMP and lipase. Radiology: ordered.    Details: Right upper quadrant pain and with suspicion for possible cholelithiasis will obtain RUQ ultrasound.    82 year old female who is a poor historian and likely with baseline dementia.  Presenting with right upper quadrant pain.  Reports this is her first presentation of the right upper quadrant pain but chart review shows she has seen her PCP twice was planning on obtaining outpatient RUQ ultrasound.  She is currently stable and not in any acute distress.  Labs for lipase, CBC and BMP ordered not collected prior to shift change.  Will be signing out to evening providers who will follow-up with labs and imaging.  Final Clinical Impression(s) / ED Diagnoses Final diagnoses:  None    Rx / DC Orders ED Discharge Orders      None         Jerre Simon, MD 08/21/22 1536    Jerre Simon, MD 08/21/22 1537    Jerre Simon, MD 08/21/22 1546    Lowther, Amy, DO 08/28/22 1735

## 2022-08-21 NOTE — ED Triage Notes (Signed)
Pt bib ems from home with reports of RUQ abd pain and nausea for the last 10 days with worsening. Family reports decreased mobility and increased falls over the last few days. Possible liver/gallbladder issues. BP 130/54 HR 100 96% RA CBG 203

## 2022-08-21 NOTE — ED Notes (Signed)
Waiting on family for blood consent as patient is altered at baseline.

## 2022-08-21 NOTE — Progress Notes (Signed)
Patient to get total of 3 units of PRBC per Smith,MD.

## 2022-08-21 NOTE — Plan of Care (Signed)

## 2022-08-22 DIAGNOSIS — I10 Essential (primary) hypertension: Secondary | ICD-10-CM | POA: Diagnosis not present

## 2022-08-22 DIAGNOSIS — N179 Acute kidney failure, unspecified: Secondary | ICD-10-CM | POA: Diagnosis present

## 2022-08-22 DIAGNOSIS — Z800 Family history of malignant neoplasm of digestive organs: Secondary | ICD-10-CM | POA: Diagnosis not present

## 2022-08-22 DIAGNOSIS — R195 Other fecal abnormalities: Secondary | ICD-10-CM | POA: Diagnosis not present

## 2022-08-22 DIAGNOSIS — R7303 Prediabetes: Secondary | ICD-10-CM | POA: Diagnosis present

## 2022-08-22 DIAGNOSIS — R109 Unspecified abdominal pain: Secondary | ICD-10-CM | POA: Diagnosis present

## 2022-08-22 DIAGNOSIS — I5032 Chronic diastolic (congestive) heart failure: Secondary | ICD-10-CM | POA: Diagnosis present

## 2022-08-22 DIAGNOSIS — K219 Gastro-esophageal reflux disease without esophagitis: Secondary | ICD-10-CM | POA: Diagnosis present

## 2022-08-22 DIAGNOSIS — D62 Acute posthemorrhagic anemia: Secondary | ICD-10-CM | POA: Diagnosis not present

## 2022-08-22 DIAGNOSIS — K760 Fatty (change of) liver, not elsewhere classified: Secondary | ICD-10-CM | POA: Diagnosis present

## 2022-08-22 DIAGNOSIS — R531 Weakness: Secondary | ICD-10-CM | POA: Diagnosis present

## 2022-08-22 DIAGNOSIS — Z882 Allergy status to sulfonamides status: Secondary | ICD-10-CM | POA: Diagnosis not present

## 2022-08-22 DIAGNOSIS — K259 Gastric ulcer, unspecified as acute or chronic, without hemorrhage or perforation: Secondary | ICD-10-CM | POA: Diagnosis not present

## 2022-08-22 DIAGNOSIS — Z79890 Hormone replacement therapy (postmenopausal): Secondary | ICD-10-CM | POA: Diagnosis not present

## 2022-08-22 DIAGNOSIS — Z888 Allergy status to other drugs, medicaments and biological substances status: Secondary | ICD-10-CM | POA: Diagnosis not present

## 2022-08-22 DIAGNOSIS — Z79899 Other long term (current) drug therapy: Secondary | ICD-10-CM | POA: Diagnosis not present

## 2022-08-22 DIAGNOSIS — R413 Other amnesia: Secondary | ICD-10-CM | POA: Diagnosis present

## 2022-08-22 DIAGNOSIS — Z9221 Personal history of antineoplastic chemotherapy: Secondary | ICD-10-CM | POA: Diagnosis not present

## 2022-08-22 DIAGNOSIS — R1010 Upper abdominal pain, unspecified: Secondary | ICD-10-CM | POA: Diagnosis not present

## 2022-08-22 DIAGNOSIS — K3189 Other diseases of stomach and duodenum: Secondary | ICD-10-CM | POA: Diagnosis not present

## 2022-08-22 DIAGNOSIS — E876 Hypokalemia: Secondary | ICD-10-CM | POA: Diagnosis not present

## 2022-08-22 DIAGNOSIS — M810 Age-related osteoporosis without current pathological fracture: Secondary | ICD-10-CM | POA: Diagnosis present

## 2022-08-22 DIAGNOSIS — E039 Hypothyroidism, unspecified: Secondary | ICD-10-CM | POA: Diagnosis not present

## 2022-08-22 DIAGNOSIS — K922 Gastrointestinal hemorrhage, unspecified: Secondary | ICD-10-CM | POA: Diagnosis present

## 2022-08-22 DIAGNOSIS — I509 Heart failure, unspecified: Secondary | ICD-10-CM

## 2022-08-22 DIAGNOSIS — F0390 Unspecified dementia without behavioral disturbance: Secondary | ICD-10-CM | POA: Diagnosis present

## 2022-08-22 DIAGNOSIS — E785 Hyperlipidemia, unspecified: Secondary | ICD-10-CM | POA: Diagnosis present

## 2022-08-22 DIAGNOSIS — N1831 Chronic kidney disease, stage 3a: Secondary | ICD-10-CM | POA: Diagnosis present

## 2022-08-22 DIAGNOSIS — I130 Hypertensive heart and chronic kidney disease with heart failure and stage 1 through stage 4 chronic kidney disease, or unspecified chronic kidney disease: Secondary | ICD-10-CM | POA: Diagnosis present

## 2022-08-22 DIAGNOSIS — K254 Chronic or unspecified gastric ulcer with hemorrhage: Secondary | ICD-10-CM | POA: Diagnosis present

## 2022-08-22 DIAGNOSIS — D509 Iron deficiency anemia, unspecified: Secondary | ICD-10-CM | POA: Diagnosis not present

## 2022-08-22 DIAGNOSIS — Z923 Personal history of irradiation: Secondary | ICD-10-CM | POA: Diagnosis not present

## 2022-08-22 DIAGNOSIS — D500 Iron deficiency anemia secondary to blood loss (chronic): Secondary | ICD-10-CM

## 2022-08-22 LAB — TYPE AND SCREEN
ABO/RH(D): O NEG
Unit division: 0

## 2022-08-22 LAB — COMPREHENSIVE METABOLIC PANEL
ALT: 19 U/L (ref 0–44)
AST: 22 U/L (ref 15–41)
Albumin: 3.1 g/dL — ABNORMAL LOW (ref 3.5–5.0)
Alkaline Phosphatase: 45 U/L (ref 38–126)
Anion gap: 8 (ref 5–15)
BUN: 14 mg/dL (ref 8–23)
CO2: 20 mmol/L — ABNORMAL LOW (ref 22–32)
Calcium: 8.2 mg/dL — ABNORMAL LOW (ref 8.9–10.3)
Chloride: 111 mmol/L (ref 98–111)
Creatinine, Ser: 0.95 mg/dL (ref 0.44–1.00)
GFR, Estimated: 60 mL/min — ABNORMAL LOW (ref >60)
Glucose, Bld: 107 mg/dL — ABNORMAL HIGH (ref 70–99)
Potassium: 3.6 mmol/L (ref 3.5–5.1)
Sodium: 139 mmol/L (ref 135–145)
Total Bilirubin: 2.1 mg/dL — ABNORMAL HIGH (ref 0.3–1.2)
Total Protein: 5.5 g/dL — ABNORMAL LOW (ref 6.5–8.1)

## 2022-08-22 LAB — CBC
HCT: 26 % — ABNORMAL LOW (ref 36.0–46.0)
Hemoglobin: 8.9 g/dL — ABNORMAL LOW (ref 12.0–15.0)
MCH: 26.5 pg (ref 26.0–34.0)
MCHC: 34.2 g/dL (ref 30.0–36.0)
MCV: 77.4 fL — ABNORMAL LOW (ref 80.0–100.0)
Platelets: 276 10*3/uL (ref 150–400)
RBC: 3.36 MIL/uL — ABNORMAL LOW (ref 3.87–5.11)
RDW: 14.9 % (ref 11.5–15.5)
WBC: 6.3 10*3/uL (ref 4.0–10.5)
nRBC: 0.3 % — ABNORMAL HIGH (ref 0.0–0.2)

## 2022-08-22 LAB — BPAM RBC: Unit Type and Rh: 9500

## 2022-08-22 MED ORDER — SODIUM CHLORIDE 0.9 % IV SOLN
250.0000 mg | Freq: Once | INTRAVENOUS | Status: AC
  Administered 2022-08-22: 250 mg via INTRAVENOUS
  Filled 2022-08-22: qty 20

## 2022-08-22 MED ORDER — HYDRALAZINE HCL 20 MG/ML IJ SOLN: 10.0000 mg | Freq: Four times a day (QID) | INTRAMUSCULAR | Status: DC | PRN

## 2022-08-22 MED ORDER — SODIUM CHLORIDE 0.9 % IV SOLN: INTRAVENOUS | Status: DC

## 2022-08-22 MED ORDER — PEG-KCL-NACL-NASULF-NA ASC-C 100 G PO SOLR
0.5000 | Freq: Once | ORAL | Status: DC
  Filled 2022-08-22: qty 1

## 2022-08-22 MED ORDER — PEG-KCL-NACL-NASULF-NA ASC-C 100 G PO SOLR
0.5000 | Freq: Once | ORAL | Status: AC
  Administered 2022-08-23: 100 g via ORAL
  Filled 2022-08-22: qty 1

## 2022-08-22 MED ORDER — PEG-KCL-NACL-NASULF-NA ASC-C 100 G PO SOLR: 0.5000 | Freq: Once | ORAL | Status: DC

## 2022-08-22 MED ORDER — PEG-KCL-NACL-NASULF-NA ASC-C 100 G PO SOLR
0.5000 | Freq: Once | ORAL | Status: AC
  Administered 2022-08-22: 100 g via ORAL
  Filled 2022-08-22: qty 1

## 2022-08-22 MED ORDER — PEG-KCL-NACL-NASULF-NA ASC-C 100 G PO SOLR: 1.0000 | Freq: Once | ORAL | Status: DC

## 2022-08-22 MED ORDER — MELATONIN 3 MG PO TABS
3.0000 mg | ORAL_TABLET | Freq: Every evening | ORAL | Status: DC | PRN
  Filled 2022-08-22 (×3): qty 1

## 2022-08-22 NOTE — Plan of Care (Signed)
  Problem: Activity: Goal: Risk for activity intolerance will decrease Outcome: Progressing   

## 2022-08-22 NOTE — H&P (View-Only) (Signed)
Consultation  Referring Provider:   [facility] Primary Care Physician:  Kristian Covey, MD Primary Gastroenterologist:  Dr. Rhea Belton       Reason for Consultation:   Anemia with FOBT positive stools         HPI:   [name] is a 82 y.o. female with past medical history significant for hypertension, hyperlipidemia, hypothyroidism, history of left breast cancer status post lumpectomy with radiation, GERD, HF PEF echo 2022, chronic constipation, memory loss presented to the ER with right upper quadrant abdominal pain.  Patient last seen in the office 2019 with Dr. Rhea Belton for constipation, scheduled for colonoscopy but patient canceled due to surgery and was lost to follow up.. Father with colon cancer in 68s. 2011 colonoscopy with Dr. Kinnie Scales mild diverticulosis no polyps excellent prep extremely difficult intubation due to redundant tortuous colon, internal hemorrhoids.   In the ER patient was found to have potassium 3.2, BUN 20, creatinine 1.01 Alk phos 43, albumin 3.0, lipase 50, AST 17, ALT 18, total bilirubin 0.1. Found to be severely anemic 4.4 with baseline of 11.9 in 03/2022  iron 6, saturations 2 and ferritin 6.  Positive fecal occult blood. Patient has no leukocytosis, normal platelets.  INR 1.1. Lipase negative Urine moderate leukocytes, negative nitrates rare bacteria. Right upper quadrant ultrasound showed mildly dilated CBD no gallstones or wall thickening no intrahepatic biliary ductal dilatation.  Hepatic steatosis but no masses. 01/30/2022 CT abdomen pelvis with contrast for nonlocalized abdominal pain no acute findings, hepatic steatosis.  Baseline patient has short-term memory loss no formal diagnosis. Patient saw PCP 5/1 for intermittent upper abdominal pain nausea and vomiting.  Normal bowel movements.  Was on omeprazole and famotidine daily. No family was present in the room during examination. Patient was able to answer where she was, states she was  unable to walk which is why she came in, she knew the president, but was unable to tell date, time.   From the notes patient is been having 10 days of abdominal pain worse postprandial. Decreased p.o. intake secondary to discomfort with associated nausea and vomiting.  Potentially brown emesis. Per the patient last bowel movement was day before yesterday, denies melena or hematochezia. Patient is on Excedrin for pain once daily in the running. Takes Tums chews for GERD, supposed to be on omeprazole outpatient, but uncertain if she has been taking this. Patient states currently she feels weak but denies abdominal pain, nausea vomiting. Patient denies smoking, alcohol, drug use. Father with colon cancer at age 54. Denies family history of gallbladder issues.   Abnormal ED labs: Abnormal Labs Reviewed  COMPREHENSIVE METABOLIC PANEL - Abnormal; Notable for the following components:      Result Value   Potassium 3.2 (*)    Glucose, Bld 119 (*)    Creatinine, Ser 1.01 (*)    Calcium 8.3 (*)    Total Protein 5.5 (*)    Albumin 3.0 (*)    Total Bilirubin 0.1 (*)    GFR, Estimated 56 (*)    All other components within normal limits  CBC WITH DIFFERENTIAL/PLATELET - Abnormal; Notable for the following components:   RBC 1.85 (*)    Hemoglobin 4.2 (*)    HCT 14.4 (*)    MCV 77.8 (*)    MCH 22.7 (*)    MCHC 29.2 (*)    RDW 15.9 (*)    All other components within normal limits  URINALYSIS, ROUTINE W REFLEX MICROSCOPIC -  Abnormal; Notable for the following components:   APPearance HAZY (*)    Leukocytes,Ua MODERATE (*)    Bacteria, UA RARE (*)    All other components within normal limits  IRON AND TIBC - Abnormal; Notable for the following components:   Iron 6 (*)    Saturation Ratios 2 (*)    All other components within normal limits  FERRITIN - Abnormal; Notable for the following components:   Ferritin 6 (*)    All other components within normal limits  RETICULOCYTES - Abnormal;  Notable for the following components:   Retic Ct Pct 3.8 (*)    RBC. 1.87 (*)    Immature Retic Fract 21.9 (*)    All other components within normal limits  HEMOGLOBIN AND HEMATOCRIT, BLOOD - Abnormal; Notable for the following components:   Hemoglobin 4.4 (*)    HCT 14.8 (*)    All other components within normal limits  APTT - Abnormal; Notable for the following components:   aPTT 20 (*)    All other components within normal limits  POC OCCULT BLOOD, ED - Abnormal; Notable for the following components:   Fecal Occult Bld POSITIVE (*)    All other components within normal limits     Past Medical History:  Diagnosis Date   Anemia    Anxiety    Arthritis    knees   Atrophic vaginitis    Cancer (HCC)    breast cancer - left   Diverticulosis    Endometriosis    Family history of breast cancer    Family history of colon cancer    GERD (gastroesophageal reflux disease)    Headache(784.0)    Heart murmur    as a child only   History of kidney stones    passed stones, no surgery required   History of radiation therapy 01/30/16- 03/02/16   Left Breast 50 Gy in 25 fractions.    Hyperlipidemia    Hypertension    Hypothyroidism    Internal hemorrhoids    Osteopenia    Osteoporosis    Personal history of chemotherapy    Personal history of radiation therapy    Rheumatic fever    age 26   Sleep apnea    " very mild" does not wear CPAP   Thyroid disease    Hypothyroid   Ulcer    UTI (lower urinary tract infection)     Surgical History:  She  has a past surgical history that includes Laparoscopic endometriosis fulguration (1978); Ulnar nerve repair (2010); Tonsillectomy; Pelvic laparoscopy; Breast lumpectomy with radioactive seed and sentinel lymph node biopsy (Left, 09/09/2015); Portacath placement (N/A, 10/04/2015); ir generic historical (12/05/2015); portacath removal; Colonoscopy; Esophagogastroduodenoscopy endoscopy; Appendectomy; Dilatation & curettage/hysteroscopy with  myosure (N/A, 10/09/2016); Breast biopsy (Left, 08/19/2015); Breast lumpectomy (Left, 09/08/2015); and Dilatation & curettage/hysteroscopy with myosure (N/A, 03/19/2022). Family History:  Her family history includes Breast cancer in her mother and paternal grandmother; Colon cancer in her father; Diabetes in her paternal grandmother; Heart disease in her father and maternal grandmother; Hypertension in her mother; Stroke in her father. Social History:   reports that she has never smoked. She has never used smokeless tobacco. She reports that she does not drink alcohol and does not use drugs.  Prior to Admission medications   Medication Sig Start Date End Date Taking? Authorizing Provider  amLODipine (NORVASC) 5 MG tablet TAKE ONE TABLET BY MOUTH EVERY MORNING 03/12/22   Burchette, Elberta Fortis, MD  atorvastatin (LIPITOR) 20 MG  tablet Take 1 tablet (20 mg total) by mouth daily. 01/22/22   Burchette, Elberta Fortis, MD  esomeprazole (NEXIUM) 40 MG capsule TAKE ONE CAPSULE BY MOUTH ONCE DAILY 08/17/22   Burchette, Elberta Fortis, MD  famotidine (PEPCID) 40 MG tablet TAKE ONE TABLET BY MOUTH ONCE DAILY BEFORE SUPPER 07/17/22   Worthy Rancher B, FNP  furosemide (LASIX) 20 MG tablet TAKE ONE TABLET BY MOUTH EVERY MORNING 03/12/22   Burchette, Elberta Fortis, MD  HYDROcodone-acetaminophen (NORCO/VICODIN) 5-325 MG tablet Take 1 tablet by mouth every 6 (six) hours as needed for moderate pain.    [provider]  levothyroxine (SYNTHROID) 50 MCG tablet TAKE ONE TABLET BY MOUTH BEFORE BREAKFAST 03/12/22   Burchette, Elberta Fortis, MD  losartan-hydrochlorothiazide (HYZAAR) 100-12.5 MG tablet TAKE ONE TABLET BY MOUTH EVERY MORNING 03/12/22   Burchette, Elberta Fortis, MD  Melatonin 3 MG TBDP Take by mouth. 01/20/18   [provider]  metoprolol tartrate (LOPRESSOR) 25 MG tablet TAKE ONE TABLET BY MOUTH EVERY MORNING and TAKE ONE TABLET BY MOUTH EVERYDAY AT BEDTIME 03/12/22   Burchette, Elberta Fortis, MD  potassium chloride SA (KLOR-CON M) 20 MEQ  tablet Take 1 tablet (20 mEq total) by mouth 2 (two) times daily. 10/03/21   Dione Booze, MD  tamoxifen (NOLVADEX) 20 MG tablet Take 1 tablet (20 mg total) by mouth daily. 09/19/21   Rachel Moulds, MD  traZODone (DESYREL) 50 MG tablet Take 50 mg by mouth at bedtime.    [provider]  TURMERIC PO Take 500 mg by mouth daily.     [provider]    Current Facility-Administered Medications  Medication Dose Route Frequency Provider Last Rate Last Admin   acetaminophen (TYLENOL) tablet 650 mg  650 mg Oral Q6H PRN Clydie Braun, MD       Or   acetaminophen (TYLENOL) suppository 650 mg  650 mg Rectal Q6H PRN Madelyn Flavors A, MD       albuterol (PROVENTIL) (2.5 MG/3ML) 0.083% nebulizer solution 2.5 mg  2.5 mg Nebulization Q6H PRN Katrinka Blazing, Rondell A, MD       atorvastatin (LIPITOR) tablet 20 mg  20 mg Oral Daily Smith, Rondell A, MD       levothyroxine (SYNTHROID) tablet 50 mcg  50 mcg Oral Q0600 Smith, Rondell A, MD       melatonin tablet 3 mg  3 mg Oral QHS PRN Opyd, Lavone Neri, MD       ondansetron (ZOFRAN) tablet 4 mg  4 mg Oral Q6H PRN Madelyn Flavors A, MD       Or   ondansetron (ZOFRAN) injection 4 mg  4 mg Intravenous Q6H PRN Clydie Braun, MD       [START ON 08/25/2022] pantoprazole (PROTONIX) injection 40 mg  40 mg Intravenous Penne Lash, MD       pantoprozole (PROTONIX) 80 mg /NS 100 mL infusion  8 mg/hr Intravenous Continuous Linwood Dibbles, MD 10 mL/hr at 08/22/22 0752 8 mg/hr at 08/22/22 0752   sodium chloride flush (NS) 0.9 % injection 3 mL  3 mL Intravenous Q12H Smith, Rondell A, MD       tamoxifen (NOLVADEX) tablet 20 mg  20 mg Oral Daily Madelyn Flavors A, MD        Allergies as of 08/21/2022 - Review Complete 08/21/2022  Allergen Reaction Noted   Nitrofurantoin Nausea And Vomiting    Sulfamethoxazole-trimethoprim Nausea And Vomiting and Other (See Comments)     Review of Systems:  Constitutional: No weight loss, fever, chills, weakness or  fatigue HEENT: Eyes: No change in vision               Ears, Nose, Throat:  No change in hearing or congestion Skin: No rash or itching Cardiovascular: No chest pain, chest pressure or palpitations   Respiratory: No SOB or cough Gastrointestinal: See HPI and otherwise negative Genitourinary: No dysuria or change in urinary frequency Neurological: No headache, dizziness or syncope Musculoskeletal: No new muscle or joint pain Hematologic: No bleeding or bruising Psychiatric: No history of depression or anxiety     Physical Exam:  Vital signs in last 24 hours: Temp:  [97.7 F (36.5 C)-98.9 F (37.2 C)] 98 F (36.7 C) (05/08 0648) Pulse Rate:  [86-112] 86 (05/08 0648) Resp:  [10-19] 17 (05/08 0648) BP: (99-147)/(45-92) 147/54 (05/08 0648) SpO2:  [98 %-100 %] 98 % (05/08 0648) Weight:  [63.2 kg] 63.2 kg (05/07 1837)   Last BM recorded by nurses in past 5 days No data recorded  General:   Pleasant, well developed female in no acute distress, pallor.  Head:  Normocephalic and atraumatic. Eyes: sclerae anicteric,conjunctive pale  Heart:  regular rate and rhythm, no murmurs or gallops Pulm: Clear anteriorly; no wheezing Abdomen:  Soft, Obese AB, Active bowel sounds. No tenderness , No organomegaly appreciated. Extremities:  Without edema. Msk:  Symmetrical without gross deformities. Peripheral pulses intact.  Neurologic:  Alert and  oriented x3;  No focal deficits.  Skin:   Dry and intact without significant lesions or rashes. Psychiatric:  Cooperative. Normal mood and affect.  LAB RESULTS: Recent Labs    08/21/22 1415 08/21/22 1912  WBC 6.2  --   HGB 4.2* 4.4*  HCT 14.4* 14.8*  PLT 299  --    BMET Recent Labs    08/21/22 1415  NA 137  K 3.2*  CL 104  CO2 22  GLUCOSE 119*  BUN 20  CREATININE 1.01*  CALCIUM 8.3*   LFT Recent Labs    08/21/22 1415  PROT 5.5*  ALBUMIN 3.0*  AST 17  ALT 18  ALKPHOS 43  BILITOT 0.1*   PT/INR Recent Labs    08/21/22 1912   LABPROT 14.1  INR 1.1    STUDIES: US Abdomen Limited RUQ (LIVER/GB)  Result Date: 08/21/2022 CLINICAL DATA:  Abdominal pain EXAM: ULTRASOUND ABDOMEN LIMITED RIGHT UPPER QUADRANT COMPARISON:  None Available. FINDINGS: Gallbladder: No gallstones or wall thickening visualized. No sonographic Murphy sign noted by sonographer. Common bile duct: Diameter: 8 mm No intrahepatic biliary ductal dilation. Liver: No focal lesion identified. Increased parenchymal echogenicity. Portal vein is patent on color Doppler imaging with normal direction of blood flow towards the liver. Other: None. IMPRESSION: 1. Mildly dilated common bile duct. Recommend correlation with liver function tests. If liver function tests are abnormal, recommend further evaluation with MRCP. 2. No sonographic evidence of acute cholecystitis. 3. Hepatic steatosis. Electronically Signed   By: Allegra Lai M.D.   On: 08/21/2022 15:31      Impression    Acute symptomatic anemia in setting of NSAID use, family history of colon cancer, upper abdominal pain with nausea and vomiting possible report of brown/coffee-ground emesis but no overt GI bleeding. without hemodynamic compromise  2011 colonoscopy Dr. Kinnie Scales diverticulosis, hemorrhoids excellent prep but very difficult intubation with redundant tortuous colon. Almost 8 g drop in hemoglobin since December, currently at 4.4 after 3 units PRBC Mild elevation of BUN from 14-->20 but also elevation in creatinine. Patient's  had 3 units PRBC and went from 4.2-4.4, will repeat but without overt GI bleeding this is very unusual.   Right upper quadrant abdominal pain with nausea and vomiting 01/30/2022 CT abdomen pelvis unremarkable gallbladder, no ductal dilatation 08/21/2022 right upper quadrant ultrasound in the ER showed mild CBD dilation AST 17, ALT 18, alk phos 43, total bilirubin 0.1 No evidence of obstruction, no pain on palpation.  Hypokalemia Replete per primary  CKD stage IIIa with  AKI Monitor kidney function likely secondary to blood loss anemia  HFpEF Echocardiac 2022 monitor fluid status  Short-term memory loss No formal diagnosis of dementia Monitor closely.  Principal Problem:   GI bleed Active Problems:   Hypothyroidism   Hyperlipidemia   Hypertension   Acute blood loss anemia   RUQ pain   Hypokalemia   Chronic kidney disease, stage III (moderate) (HCC)   History of breast cancer   Short-term memory loss    LOS: 0 days     Plan   From symptoms description sounds like more likely upper GI bleed, however last colonoscopy 2011 and patient's family history need to consider potential AVM/malignancy.  Patient had very redundant tortuous colon last time and feels like at this time she would not build to complete prep. -EGD and colonoscopy scheduled for tomorrow with Dr. Rhea Belton, patient apprehensive to do prep but is willing to try.  If unable to complete will proceed with EGD with colonoscopy Friday. Patient relatively lucid today, aware of place person and circumstance however unaware of time.  May need verbal consent from husband. -In the meantime continue supportive care. Clear liquid diet today, n.p.o. 4 a.m. Movie prep split dosing today - Currently status post 3 units and HGB 4.4 to 8.9 --Continue to monitor H&H with transfusion as needed to maintain hemoglobin greater than 7.  --Protonix 40 mg IV BID. -For mild CBD dilatation, normal LFTs, no pain on palpation, CT unremarkable in October.  Continue to monitor liver function, consider MRCP if become elevated.  Further recommendations per Dr. Rhea Belton.  Thank you for your kind consultation, we will continue to follow.   Doree Albee  08/22/2022, 8:27 AM

## 2022-08-22 NOTE — Progress Notes (Signed)
PROGRESS NOTE    [name]  UEA:[id number] DOB: January 30, 1941 DOA: 08/21/2022 PCP: Kristian Covey, MD   Brief Narrative:  HPI: [name] is a 82 y.o. female with medical history significant of hypertension, hyperlipidemia, hypothyroidism, left breast cancer s/p lumpectomy with radiation, endometriosis, memory loss, and GERD who presents with complaints of right upper quadrant abdominal pain.  History is obtained from patient with assistance of her husband and son who are present at bedside.  Husband is that the patient has short-term memory loss for which she has not been formally diagnosed, but cannot recall being told something 5 minutes ago.  Husband also reports that she is intermittently asking for her mother and father who are deceased.  For the last 10 days patient has been complaining of abdominal pain.  Patient states pain is constant, but usually worsens after trying to eat.  She had not been able to eat or drink much of anything.  Husband notes associated symptoms of nausea and vomiting.  Emesis was reported to be brown in color, but patient denied having any blood present.  She reports having normal stools.  Family makes note that the patient takes Dramamine for sleep, Excedrin for pain, and Tums chews.  She is not on any other patient states she only takes 1 Excedrin per day but family is unable to verify.  Last colonoscopy appeared to have been in 2009.  Over the last 3 days pain seem to worsen and she was constantly crying in bed.  They tried to get her up to go to the doctor today but patient had a near syncopal episode after getting up to the steps.  Patient reportedly has had this pain for quite some time, but husband notes that the frequency and duration of symptoms has increased.   In the emergency department patient was noted to be a febrile with pulse 96-112, and all other vital signs maintained.  Labs significant for hemoglobin 4.2, potassium 3.2, BUN 20,  creatinine 1.01, and calcium 8.3.  Stool guaiacs were noted to be positive.  Right upper quadrant ultrasound noted mildly common bile duct.  Patient was typed and screened and ordered 2 units of packed red blood cells.  Patient had also been started on a Protonix drip.  Dr. Rhea Belton of [facility] have been consulted.  Assessment & Plan:   Principal Problem:   GI bleed Active Problems:   Acute blood loss anemia   RUQ pain   Hypokalemia   Chronic kidney disease, stage III (moderate) (HCC)   Hypertension   Short-term memory loss   Hypothyroidism   Hyperlipidemia   History of breast cancer  Acute blood loss anemia secondary to GI bleed Found to have hemoglobin down to 4.2 with low MCV and MCH.  Hemoglobin previously had been 11.9 on 03/19/2022.  Stool guaiacs positive.  Patient did admit to using Excedrin.  S/p 2 unit PRBC transfusion, hemoglobin 8.9.  She denies any melena or hematemesis.  Likely upper GI bleed.  On Protonix drip.  GI on board, plan for EGD and colonoscopy tomorrow and day after.  Monitor hemoglobin every day.   Right upper quadrant abdominal pain Ultrasound negative except mild CBD dilatation but LFTs normal, no need of MRCP.  Currently he has no pain or tenderness..    Hypokalemia Replaced today, waiting for results today.   Chronic kidney disease stage IIIa At baseline.   Essential hypertension Blood pressures currently maintained. -Held home blood pressure medications in the setting  of acute blood loss.  Resume when medically appropriate.  Will treat with IV as needed hydralazine in the meantime.   Short-term memory loss Patient has pretty significant short-term memory loss, but husband notes that she has not formally been diagnosed with dementia.   Hypothyroidism -Continue levothyroxine   Hyperlipidemia -Continue atorvastatin  DVT prophylaxis: SCDs Start: 08/21/22 1715   Code Status: Full Code  Family Communication:  None present at bedside.  Plan of care  discussed with patient in length and he/she verbalized understanding and agreed with it.  Status is: Observation The patient will require care spanning > 2 midnights and should be moved to inpatient because: She is scheduled to have EGD and colonoscopy.    Estimated body mass index is 27.21 kg/m as calculated from the following:   Height as of this encounter: 5' (1.524 m).   Weight as of this encounter: 63.2 kg.    Nutritional Assessment: Body mass index is 27.21 kg/m.Marland Kitchen Seen by dietician.  I agree with the assessment and plan as outlined below: Nutrition Status:        . Skin Assessment: I have examined the patient's skin and I agree with the wound assessment as performed by the wound care RN as outlined below:    Consultants:  GI  Procedures:  None  Antimicrobials:  Anti-infectives (From admission, onward)    None         Subjective: Patient seen and examined.  She says that she is feeling better.  No abdominal pain or other complaint.  Objective: Vitals:   08/22/22 0327 08/22/22 0401 08/22/22 0648 08/22/22 0931  BP: (!) 139/55 137/86 (!) 147/54 (!) 148/57  Pulse: 88 89 86 87  Resp: 16 17 17    Temp: 97.7 F (36.5 C) 98.3 F (36.8 C) 98 F (36.7 C) 98.9 F (37.2 C)  TempSrc: Oral Oral Oral Oral  SpO2: 99% 99% 98% 98%  Weight:      Height:        Intake/Output Summary (Last 24 hours) at 08/22/2022 1047 Last data filed at 08/22/2022 0900 Gross per 24 hour  Intake 1401.2 ml  Output 301 ml  Net 1100.2 ml   Filed Weights   08/21/22 1837  Weight: 63.2 kg    Examination:  General exam: Appears calm and comfortable  Respiratory system: Clear to auscultation. Respiratory effort normal. Cardiovascular system: S1 & S2 heard, RRR. No JVD, murmurs, rubs, gallops or clicks. No pedal edema. Gastrointestinal system: Abdomen is nondistended, soft and nontender. No organomegaly or masses felt. Normal bowel sounds heard. Central nervous system: Alert and  oriented. No focal neurological deficits. Extremities: Symmetric 5 x 5 power. Skin: No rashes, lesions or ulcers Psychiatry: Judgement and insight appear normal. Mood & affect appropriate.    Data Reviewed: I have personally reviewed following labs and imaging studies  CBC: Recent Labs  Lab 08/21/22 1415 08/21/22 1912 08/22/22 0926  WBC 6.2  --  6.3  NEUTROABS 4.3  --   --   HGB 4.2* 4.4* 8.9*  HCT 14.4* 14.8* 26.0*  MCV 77.8*  --  77.4*  PLT 299  --  276   Basic Metabolic Panel: Recent Labs  Lab 08/21/22 1415  NA 137  K 3.2*  CL 104  CO2 22  GLUCOSE 119*  BUN 20  CREATININE 1.01*  CALCIUM 8.3*   GFR: Estimated Creatinine Clearance: 35.7 mL/min (A) (by C-G formula based on SCr of 1.01 mg/dL (H)). Liver Function Tests: Recent Labs  Lab 08/21/22  1415  AST 17  ALT 18  ALKPHOS 43  BILITOT 0.1*  PROT 5.5*  ALBUMIN 3.0*   Recent Labs  Lab 08/21/22 1415  LIPASE 50   No results for input(s): "AMMONIA" in the last 168 hours. Coagulation Profile: Recent Labs  Lab 08/21/22 1912  INR 1.1   Cardiac Enzymes: No results for input(s): "CKTOTAL", "CKMB", "CKMBINDEX", "TROPONINI" in the last 168 hours. BNP (last 3 results) No results for input(s): "PROBNP" in the last 8760 hours. HbA1C: No results for input(s): "HGBA1C" in the last 72 hours. CBG: No results for input(s): "GLUCAP" in the last 168 hours. Lipid Profile: No results for input(s): "CHOL", "HDL", "LDLCALC", "TRIG", "CHOLHDL", "LDLDIRECT" in the last 72 hours. Thyroid Function Tests: No results for input(s): "TSH", "T4TOTAL", "FREET4", "T3FREE", "THYROIDAB" in the last 72 hours. Anemia Panel: Recent Labs    08/21/22 1605  VITAMINB12 446  FOLATE >40.0  FERRITIN 6*  TIBC 384  IRON 6*  RETICCTPCT 3.8*   Sepsis Labs: No results for input(s): "PROCALCITON", "LATICACIDVEN" in the last 168 hours.  No results found for this or any previous visit (from the past 240 hour(s)).   Radiology  Studies: US Abdomen Limited RUQ (LIVER/GB)  Result Date: 08/21/2022 CLINICAL DATA:  Abdominal pain EXAM: ULTRASOUND ABDOMEN LIMITED RIGHT UPPER QUADRANT COMPARISON:  None Available. FINDINGS: Gallbladder: No gallstones or wall thickening visualized. No sonographic Murphy sign noted by sonographer. Common bile duct: Diameter: 8 mm No intrahepatic biliary ductal dilation. Liver: No focal lesion identified. Increased parenchymal echogenicity. Portal vein is patent on color Doppler imaging with normal direction of blood flow towards the liver. Other: None. IMPRESSION: 1. Mildly dilated common bile duct. Recommend correlation with liver function tests. If liver function tests are abnormal, recommend further evaluation with MRCP. 2. No sonographic evidence of acute cholecystitis. 3. Hepatic steatosis. Electronically Signed   By: Allegra Lai M.D.   On: 08/21/2022 15:31    Scheduled Meds:  atorvastatin  20 mg Oral Daily   levothyroxine  50 mcg Oral Q0600   [START ON 08/25/2022] pantoprazole  40 mg Intravenous Q12H   sodium chloride flush  3 mL Intravenous Q12H   tamoxifen  20 mg Oral Daily   Continuous Infusions:  pantoprazole 8 mg/hr (08/22/22 0752)     LOS: 0 days   Hughie Closs, MD [facility]  08/22/2022, 10:47 AM   *Please note that this is a verbal dictation therefore any spelling or grammatical errors are due to the "Dragon Medical One" system interpretation.  Please page via Amion and do not message via secure chat for urgent patient care matters. Secure chat can be used for non urgent patient care matters.  How to contact the [facility] Attending or Consulting provider 7A - 7P or covering provider during after hours 7P -7A, for this patient?  Check the care team in [facility] and look for a) attending/consulting TRH provider listed and b) the [facility] team listed. Page or secure chat 7A-7P. Log into [email or web address] and use [facility]'s universal password to access. If you do not have the password,  please contact the hospital operator. Locate the [facility] provider you are looking for under [facility] and page to a number that you can be directly reached. If you still have difficulty reaching the provider, please page the [facility] (Director on Call) for the Hospitalists listed on amion for assistance.

## 2022-08-22 NOTE — TOC Initial Note (Signed)
Transition of Care [facility]) - Initial/Assessment Note    Patient Details  Name: [name] MRN: [id number] Date of Birth: 19-Apr-1940  Transition of Care [facility]) CM/SW Contact:    Tom-Johnson, Hershal Coria, RN Phone Number: 08/22/2022, 3:25 PM  Clinical Narrative:                  CM spoke with patient and husband, Maisie Fus at bedside about needs for post hospital transition. Admitted for GI Bleed. Hgb on admit was 4.4. Patient has received 3U PRBC this admit, hgb now at 8.9.  Also receiving IV Ferric Gluconate.  Patient is from home with husband and only son. Independent prior to admission. Has a walker and shower seat at home. PCP is Burchette, Elberta Fortis, MD and uses [facility] on [address].  No TOC needs or recommendations noted at this time. CM will continue to follow as patient progresses with care towards discharge.          Expected Discharge Plan: Home/Self Care Barriers to Discharge: Continued Medical Work up   Patient Goals and CMS Choice Patient states their goals for this hospitalization and ongoing recovery are:: To return home CMS Medicare.gov Compare Post Acute Care list provided to:: Patient Choice offered to / list presented to : Patient, Spouse      Expected Discharge Plan and Services   Discharge Planning Services: CM Consult Post Acute Care Choice: NA Living arrangements for the past 2 months: Single Family Home                 DME Arranged: N/A DME Agency: NA       HH Arranged: NA HH Agency: NA        Prior Living Arrangements/Services Living arrangements for the past 2 months: Single Family Home Lives with:: Spouse Patient language and need for interpreter reviewed:: Yes Do you feel safe going back to the place where you live?: Yes      Need for Family Participation in Patient Care: Yes (Comment) Care giver support system in place?: Yes (comment) Current home services: DME (Walker, shower seat) Criminal Activity/Legal  Involvement Pertinent to Current Situation/Hospitalization: No - Comment as needed  Activities of Daily Living Home Assistive Devices/Equipment: None ADL Screening (condition at time of admission) Patient's cognitive ability adequate to safely complete daily activities?: No Is the patient deaf or have difficulty hearing?: No Does the patient have difficulty seeing, even when wearing glasses/contacts?: No Does the patient have difficulty concentrating, remembering, or making decisions?: Yes Patient able to express need for assistance with ADLs?: Yes Does the patient have difficulty dressing or bathing?: No Independently performs ADLs?: Yes (appropriate for developmental age) Does the patient have difficulty walking or climbing stairs?: Yes Weakness of Legs: Both Weakness of Arms/Hands: Both  Permission Sought/Granted Permission sought to share information with : Case Manager, Family Supports Permission granted to share information with : Yes, Verbal Permission Granted              Emotional Assessment Appearance:: Appears stated age Attitude/Demeanor/Rapport: Engaged, Gracious Affect (typically observed): Accepting, Appropriate, Calm, Hopeful, Pleasant Orientation: : Oriented to Self, Oriented to  Time, Oriented to Place, Oriented to Situation Alcohol / Substance Use: Not Applicable Psych Involvement: No (comment)  Admission diagnosis:  Acute blood loss anemia [D62] GI bleed [K92.2] Gastrointestinal hemorrhage, unspecified gastrointestinal hemorrhage type [K92.2] GIB (gastrointestinal bleeding) [K92.2] Patient Active Problem List   Diagnosis Date Noted   GIB (gastrointestinal bleeding) 08/22/2022   GI bleed 08/21/2022  Acute blood loss anemia 08/21/2022   RUQ pain 08/21/2022   Hypokalemia 08/21/2022   Chronic kidney disease, stage III (moderate) (HCC) 08/21/2022   History of breast cancer 08/21/2022   Short-term memory loss 08/21/2022   Cognitive impairment 10/04/2021    (HFpEF) heart failure with preserved ejection fraction (HCC) 10/25/2020   Fusion of spine of thoracolumbar region 02/21/2018   Chronic midline low back pain with bilateral sciatica 11/06/2016   Genetic testing 09/23/2015   Malignant neoplasm of upper-outer quadrant of left breast in female, estrogen receptor positive (HCC) 09/07/2015   Family history of breast cancer    Family history of colon cancer    Prediabetes 08/30/2014   Endometriosis    Osteopenia    Hypertension    UTI (lower urinary tract infection) 03/05/2011   Dysuria 03/05/2011   Hypothyroidism 06/06/2009   FATIGUE 06/06/2009   ELEVATED BLOOD PRESSURE 06/06/2009   Disorder of bone and cartilage 09/09/2008   SCOLIOSIS 09/09/2008   Hyperlipidemia 07/30/2008   Migraine headache 07/30/2008   GERD 07/30/2008   ENTHESOPATHY OF HIP REGION 07/30/2008   HEADACHE 07/30/2008   PCP:  Kristian Covey, MD Pharmacy:   [facility] [phone] - Closed [place], [place] - [phone] [facility] [address] AT [facility] [address] [place] [address] Phone: [phone] Fax: [phone]  [facility] - [place], [place] - [address] [place] [address] Phone: [phone] Fax: [phone]     Social Determinants of Health (SDOH) Social History: SDOH Screenings   Food Insecurity: No Food Insecurity (05/22/2022)  Housing: Low Risk  (05/22/2022)  Transportation Needs: No Transportation Needs (05/22/2022)  Utilities: Not At Risk (05/22/2022)  Alcohol Screen: Low Risk  (03/16/2020)  Depression (PHQ2-9): Medium Risk (08/15/2022)  Financial Resource Strain: Low Risk  (05/22/2022)  Physical Activity: Insufficiently Active (03/17/2021)  Social Connections: Socially Integrated (05/22/2022)  Stress: No Stress Concern Present (05/22/2022)  Tobacco Use: Low Risk  (08/21/2022)   SDOH Interventions: Transportation Interventions: Intervention Not Indicated, Inpatient  TOC, Patient Resources (Friends/Family)   Readmission Risk Interventions     No data to display

## 2022-08-22 NOTE — Consult Note (Addendum)
     Consultation  Referring Provider:   [facility] Primary Care Physician:  Burchette, Bruce W, MD Primary Gastroenterologist:  Dr. Pyrtle       Reason for Consultation:   Anemia with FOBT positive stools         HPI:   [name] is a 82 y.o. female with past medical history significant for hypertension, hyperlipidemia, hypothyroidism, history of left breast cancer status post lumpectomy with radiation, GERD, HF PEF echo 2022, chronic constipation, memory loss presented to the ER with right upper quadrant abdominal pain.  Patient last seen in the office 2019 with Dr. Pyrtle for constipation, scheduled for colonoscopy but patient canceled due to surgery and was lost to follow up.. Father with colon cancer in 70s. 2011 colonoscopy with Dr. Medoff mild diverticulosis no polyps excellent prep extremely difficult intubation due to redundant tortuous colon, internal hemorrhoids.   In the ER patient was found to have potassium 3.2, BUN 20, creatinine 1.01 Alk phos 43, albumin 3.0, lipase 50, AST 17, ALT 18, total bilirubin 0.1. Found to be severely anemic 4.4 with baseline of 11.9 in 03/2022  iron 6, saturations 2 and ferritin 6.  Positive fecal occult blood. Patient has no leukocytosis, normal platelets.  INR 1.1. Lipase negative Urine moderate leukocytes, negative nitrates rare bacteria. Right upper quadrant ultrasound showed mildly dilated CBD no gallstones or wall thickening no intrahepatic biliary ductal dilatation.  Hepatic steatosis but no masses. 01/30/2022 CT abdomen pelvis with contrast for nonlocalized abdominal pain no acute findings, hepatic steatosis.  Baseline patient has short-term memory loss no formal diagnosis. Patient saw PCP 5/1 for intermittent upper abdominal pain nausea and vomiting.  Normal bowel movements.  Was on omeprazole and famotidine daily. No family was present in the room during examination. Patient was able to answer where she was, states she was  unable to walk which is why she came in, she knew the president, but was unable to tell date, time.   From the notes patient is been having 10 days of abdominal pain worse postprandial. Decreased p.o. intake secondary to discomfort with associated nausea and vomiting.  Potentially brown emesis. Per the patient last bowel movement was day before yesterday, denies melena or hematochezia. Patient is on Excedrin for pain once daily in the running. Takes Tums chews for GERD, supposed to be on omeprazole outpatient, but uncertain if she has been taking this. Patient states currently she feels weak but denies abdominal pain, nausea vomiting. Patient denies smoking, alcohol, drug use. Father with colon cancer at age 70. Denies family history of gallbladder issues.   Abnormal ED labs: Abnormal Labs Reviewed  COMPREHENSIVE METABOLIC PANEL - Abnormal; Notable for the following components:      Result Value   Potassium 3.2 (*)    Glucose, Bld 119 (*)    Creatinine, Ser 1.01 (*)    Calcium 8.3 (*)    Total Protein 5.5 (*)    Albumin 3.0 (*)    Total Bilirubin 0.1 (*)    GFR, Estimated 56 (*)    All other components within normal limits  CBC WITH DIFFERENTIAL/PLATELET - Abnormal; Notable for the following components:   RBC 1.85 (*)    Hemoglobin 4.2 (*)    HCT 14.4 (*)    MCV 77.8 (*)    MCH 22.7 (*)    MCHC 29.2 (*)    RDW 15.9 (*)    All other components within normal limits  URINALYSIS, ROUTINE W REFLEX MICROSCOPIC -   Abnormal; Notable for the following components:   APPearance HAZY (*)    Leukocytes,Ua MODERATE (*)    Bacteria, UA RARE (*)    All other components within normal limits  IRON AND TIBC - Abnormal; Notable for the following components:   Iron 6 (*)    Saturation Ratios 2 (*)    All other components within normal limits  FERRITIN - Abnormal; Notable for the following components:   Ferritin 6 (*)    All other components within normal limits  RETICULOCYTES - Abnormal;  Notable for the following components:   Retic Ct Pct 3.8 (*)    RBC. 1.87 (*)    Immature Retic Fract 21.9 (*)    All other components within normal limits  HEMOGLOBIN AND HEMATOCRIT, BLOOD - Abnormal; Notable for the following components:   Hemoglobin 4.4 (*)    HCT 14.8 (*)    All other components within normal limits  APTT - Abnormal; Notable for the following components:   aPTT 20 (*)    All other components within normal limits  POC OCCULT BLOOD, ED - Abnormal; Notable for the following components:   Fecal Occult Bld POSITIVE (*)    All other components within normal limits     Past Medical History:  Diagnosis Date   Anemia    Anxiety    Arthritis    knees   Atrophic vaginitis    Cancer (HCC)    breast cancer - left   Diverticulosis    Endometriosis    Family history of breast cancer    Family history of colon cancer    GERD (gastroesophageal reflux disease)    Headache(784.0)    Heart murmur    as a child only   History of kidney stones    passed stones, no surgery required   History of radiation therapy 01/30/16- 03/02/16   Left Breast 50 Gy in 25 fractions.    Hyperlipidemia    Hypertension    Hypothyroidism    Internal hemorrhoids    Osteopenia    Osteoporosis    Personal history of chemotherapy    Personal history of radiation therapy    Rheumatic fever    age 10   Sleep apnea    " very mild" does not wear CPAP   Thyroid disease    Hypothyroid   Ulcer    UTI (lower urinary tract infection)     Surgical History:  She  has a past surgical history that includes Laparoscopic endometriosis fulguration (1978); Ulnar nerve repair (2010); Tonsillectomy; Pelvic laparoscopy; Breast lumpectomy with radioactive seed and sentinel lymph node biopsy (Left, 09/09/2015); Portacath placement (N/A, 10/04/2015); ir generic historical (12/05/2015); portacath removal; Colonoscopy; Esophagogastroduodenoscopy endoscopy; Appendectomy; Dilatation & curettage/hysteroscopy with  myosure (N/A, 10/09/2016); Breast biopsy (Left, 08/19/2015); Breast lumpectomy (Left, 09/08/2015); and Dilatation & curettage/hysteroscopy with myosure (N/A, 03/19/2022). Family History:  Her family history includes Breast cancer in her mother and paternal grandmother; Colon cancer in her father; Diabetes in her paternal grandmother; Heart disease in her father and maternal grandmother; Hypertension in her mother; Stroke in her father. Social History:   reports that she has never smoked. She has never used smokeless tobacco. She reports that she does not drink alcohol and does not use drugs.  Prior to Admission medications   Medication Sig Start Date End Date Taking? Authorizing Provider  amLODipine (NORVASC) 5 MG tablet TAKE ONE TABLET BY MOUTH EVERY MORNING 03/12/22   Burchette, Bruce W, MD  atorvastatin (LIPITOR) 20 MG   tablet Take 1 tablet (20 mg total) by mouth daily. 01/22/22   Burchette, Bruce W, MD  esomeprazole (NEXIUM) 40 MG capsule TAKE ONE CAPSULE BY MOUTH ONCE DAILY 08/17/22   Burchette, Bruce W, MD  famotidine (PEPCID) 40 MG tablet TAKE ONE TABLET BY MOUTH ONCE DAILY BEFORE SUPPER 07/17/22   Webb, Padonda B, FNP  furosemide (LASIX) 20 MG tablet TAKE ONE TABLET BY MOUTH EVERY MORNING 03/12/22   Burchette, Bruce W, MD  HYDROcodone-acetaminophen (NORCO/VICODIN) 5-325 MG tablet Take 1 tablet by mouth every 6 (six) hours as needed for moderate pain.    [provider]  levothyroxine (SYNTHROID) 50 MCG tablet TAKE ONE TABLET BY MOUTH BEFORE BREAKFAST 03/12/22   Burchette, Bruce W, MD  losartan-hydrochlorothiazide (HYZAAR) 100-12.5 MG tablet TAKE ONE TABLET BY MOUTH EVERY MORNING 03/12/22   Burchette, Bruce W, MD  Melatonin 3 MG TBDP Take by mouth. 01/20/18   [provider]  metoprolol tartrate (LOPRESSOR) 25 MG tablet TAKE ONE TABLET BY MOUTH EVERY MORNING and TAKE ONE TABLET BY MOUTH EVERYDAY AT BEDTIME 03/12/22   Burchette, Bruce W, MD  potassium chloride SA (KLOR-CON M) 20 MEQ  tablet Take 1 tablet (20 mEq total) by mouth 2 (two) times daily. 10/03/21   Glick, David, MD  tamoxifen (NOLVADEX) 20 MG tablet Take 1 tablet (20 mg total) by mouth daily. 09/19/21   Iruku, Praveena, MD  traZODone (DESYREL) 50 MG tablet Take 50 mg by mouth at bedtime.    [provider]  TURMERIC PO Take 500 mg by mouth daily.     [provider]    Current Facility-Administered Medications  Medication Dose Route Frequency Provider Last Rate Last Admin   acetaminophen (TYLENOL) tablet 650 mg  650 mg Oral Q6H PRN Smith, Rondell A, MD       Or   acetaminophen (TYLENOL) suppository 650 mg  650 mg Rectal Q6H PRN Smith, Rondell A, MD       albuterol (PROVENTIL) (2.5 MG/3ML) 0.083% nebulizer solution 2.5 mg  2.5 mg Nebulization Q6H PRN Smith, Rondell A, MD       atorvastatin (LIPITOR) tablet 20 mg  20 mg Oral Daily Smith, Rondell A, MD       levothyroxine (SYNTHROID) tablet 50 mcg  50 mcg Oral Q0600 Smith, Rondell A, MD       melatonin tablet 3 mg  3 mg Oral QHS PRN Opyd, Timothy S, MD       ondansetron (ZOFRAN) tablet 4 mg  4 mg Oral Q6H PRN Smith, Rondell A, MD       Or   ondansetron (ZOFRAN) injection 4 mg  4 mg Intravenous Q6H PRN Smith, Rondell A, MD       [START ON 08/25/2022] pantoprazole (PROTONIX) injection 40 mg  40 mg Intravenous Q12H Knapp, Jon, MD       pantoprozole (PROTONIX) 80 mg /NS 100 mL infusion  8 mg/hr Intravenous Continuous Knapp, Jon, MD 10 mL/hr at 08/22/22 0752 8 mg/hr at 08/22/22 0752   sodium chloride flush (NS) 0.9 % injection 3 mL  3 mL Intravenous Q12H Smith, Rondell A, MD       tamoxifen (NOLVADEX) tablet 20 mg  20 mg Oral Daily Smith, Rondell A, MD        Allergies as of 08/21/2022 - Review Complete 08/21/2022  Allergen Reaction Noted   Nitrofurantoin Nausea And Vomiting    Sulfamethoxazole-trimethoprim Nausea And Vomiting and Other (See Comments)     Review of Systems:      Constitutional: No weight loss, fever, chills, weakness or  fatigue HEENT: Eyes: No change in vision               Ears, Nose, Throat:  No change in hearing or congestion Skin: No rash or itching Cardiovascular: No chest pain, chest pressure or palpitations   Respiratory: No SOB or cough Gastrointestinal: See HPI and otherwise negative Genitourinary: No dysuria or change in urinary frequency Neurological: No headache, dizziness or syncope Musculoskeletal: No new muscle or joint pain Hematologic: No bleeding or bruising Psychiatric: No history of depression or anxiety     Physical Exam:  Vital signs in last 24 hours: Temp:  [97.7 F (36.5 C)-98.9 F (37.2 C)] 98 F (36.7 C) (05/08 0648) Pulse Rate:  [86-112] 86 (05/08 0648) Resp:  [10-19] 17 (05/08 0648) BP: (99-147)/(45-92) 147/54 (05/08 0648) SpO2:  [98 %-100 %] 98 % (05/08 0648) Weight:  [63.2 kg] 63.2 kg (05/07 1837)   Last BM recorded by nurses in past 5 days No data recorded  General:   Pleasant, well developed female in no acute distress, pallor.  Head:  Normocephalic and atraumatic. Eyes: sclerae anicteric,conjunctive pale  Heart:  regular rate and rhythm, no murmurs or gallops Pulm: Clear anteriorly; no wheezing Abdomen:  Soft, Obese AB, Active bowel sounds. No tenderness , No organomegaly appreciated. Extremities:  Without edema. Msk:  Symmetrical without gross deformities. Peripheral pulses intact.  Neurologic:  Alert and  oriented x3;  No focal deficits.  Skin:   Dry and intact without significant lesions or rashes. Psychiatric:  Cooperative. Normal mood and affect.  LAB RESULTS: Recent Labs    08/21/22 1415 08/21/22 1912  WBC 6.2  --   HGB 4.2* 4.4*  HCT 14.4* 14.8*  PLT 299  --    BMET Recent Labs    08/21/22 1415  NA 137  K 3.2*  CL 104  CO2 22  GLUCOSE 119*  BUN 20  CREATININE 1.01*  CALCIUM 8.3*   LFT Recent Labs    08/21/22 1415  PROT 5.5*  ALBUMIN 3.0*  AST 17  ALT 18  ALKPHOS 43  BILITOT 0.1*   PT/INR Recent Labs    08/21/22 1912   LABPROT 14.1  INR 1.1    STUDIES: US Abdomen Limited RUQ (LIVER/GB)  Result Date: 08/21/2022 CLINICAL DATA:  Abdominal pain EXAM: ULTRASOUND ABDOMEN LIMITED RIGHT UPPER QUADRANT COMPARISON:  None Available. FINDINGS: Gallbladder: No gallstones or wall thickening visualized. No sonographic Murphy sign noted by sonographer. Common bile duct: Diameter: 8 mm No intrahepatic biliary ductal dilation. Liver: No focal lesion identified. Increased parenchymal echogenicity. Portal vein is patent on color Doppler imaging with normal direction of blood flow towards the liver. Other: None. IMPRESSION: 1. Mildly dilated common bile duct. Recommend correlation with liver function tests. If liver function tests are abnormal, recommend further evaluation with MRCP. 2. No sonographic evidence of acute cholecystitis. 3. Hepatic steatosis. Electronically Signed   By: Leah  Strickland M.D.   On: 08/21/2022 15:31      Impression    Acute symptomatic anemia in setting of NSAID use, family history of colon cancer, upper abdominal pain with nausea and vomiting possible report of brown/coffee-ground emesis but no overt GI bleeding. without hemodynamic compromise  2011 colonoscopy Dr. Medoff diverticulosis, hemorrhoids excellent prep but very difficult intubation with redundant tortuous colon. Almost 8 g drop in hemoglobin since December, currently at 4.4 after 3 units PRBC Mild elevation of BUN from 14-->20 but also elevation in creatinine. Patient's   had 3 units PRBC and went from 4.2-4.4, will repeat but without overt GI bleeding this is very unusual.   Right upper quadrant abdominal pain with nausea and vomiting 01/30/2022 CT abdomen pelvis unremarkable gallbladder, no ductal dilatation 08/21/2022 right upper quadrant ultrasound in the ER showed mild CBD dilation AST 17, ALT 18, alk phos 43, total bilirubin 0.1 No evidence of obstruction, no pain on palpation.  Hypokalemia Replete per primary  CKD stage IIIa with  AKI Monitor kidney function likely secondary to blood loss anemia  HFpEF Echocardiac 2022 monitor fluid status  Short-term memory loss No formal diagnosis of dementia Monitor closely.  Principal Problem:   GI bleed Active Problems:   Hypothyroidism   Hyperlipidemia   Hypertension   Acute blood loss anemia   RUQ pain   Hypokalemia   Chronic kidney disease, stage III (moderate) (HCC)   History of breast cancer   Short-term memory loss    LOS: 0 days     Plan   From symptoms description sounds like more likely upper GI bleed, however last colonoscopy 2011 and patient's family history need to consider potential AVM/malignancy.  Patient had very redundant tortuous colon last time and feels like at this time she would not build to complete prep. -EGD and colonoscopy scheduled for tomorrow with Dr. Pyrtle, patient apprehensive to do prep but is willing to try.  If unable to complete will proceed with EGD with colonoscopy Friday. Patient relatively lucid today, aware of place person and circumstance however unaware of time.  May need verbal consent from husband. -In the meantime continue supportive care. Clear liquid diet today, n.p.o. 4 a.m. Movie prep split dosing today - Currently status post 3 units and HGB 4.4 to 8.9 --Continue to monitor H&H with transfusion as needed to maintain hemoglobin greater than 7.  --Protonix 40 mg IV BID. -For mild CBD dilatation, normal LFTs, no pain on palpation, CT unremarkable in October.  Continue to monitor liver function, consider MRCP if become elevated.  Further recommendations per Dr. Pyrtle.  Thank you for your kind consultation, we will continue to follow.   Natalio Salois R Kayte Borchard  08/22/2022, 8:27 AM  

## 2022-08-22 NOTE — Progress Notes (Addendum)
MEDICATION RELATED CONSULT NOTE - INITIAL   Pharmacy Consult for IV iron Indication: anemia, GIB  Allergies  Allergen Reactions   Nitrofurantoin Nausea And Vomiting    GI upset   Sulfamethoxazole-Trimethoprim Nausea And Vomiting and Other (See Comments)    GI upset    Patient Measurements: Height: 5' (152.4 cm) Weight: 63.2 kg (139 lb 5.3 oz) IBW/kg (Calculated) : 45.5  Vital Signs: Temp: 98.9 F (37.2 C) (05/08 0931) Temp Source: Oral (05/08 0931) BP: 148/57 (05/08 0931) Pulse Rate: 87 (05/08 0931) Intake/Output from previous day: 05/07 0701 - 05/08 0700 In: 1382.5 [P.O.:120; I.V.:172.5; Blood:1090] Out: 301 [Urine:300; Stool:1] Intake/Output from this shift: Total I/O In: 18.7 [I.V.:18.7] Out: -   Labs: Recent Labs    08/21/22 1415 08/21/22 1912 08/22/22 0926  WBC 6.2  --  6.3  HGB 4.2* 4.4* 8.9*  HCT 14.4* 14.8* 26.0*  PLT 299  --  276  APTT  --  20*  --   CREATININE 1.01*  --  0.95  ALBUMIN 3.0*  --  3.1*  PROT 5.5*  --  5.5*  AST 17  --  22  ALT 18  --  19  ALKPHOS 43  --  45  BILITOT 0.1*  --  2.1*   Estimated Creatinine Clearance: 37.9 mL/min (by C-G formula based on SCr of 0.95 mg/dL).   Microbiology: No results found for this or any previous visit (from the past 720 hour(s)).  Medical History: Past Medical History:  Diagnosis Date   Anemia    Anxiety    Arthritis    knees   Atrophic vaginitis    Cancer (HCC)    breast cancer - left   Diverticulosis    Endometriosis    Family history of breast cancer    Family history of colon cancer    GERD (gastroesophageal reflux disease)    Headache(784.0)    Heart murmur    as a child only   History of kidney stones    passed stones, no surgery required   History of radiation therapy 01/30/16- 03/02/16   Left Breast 50 Gy in 25 fractions.    Hyperlipidemia    Hypertension    Hypothyroidism    Internal hemorrhoids    Osteopenia    Osteoporosis    Personal history of chemotherapy     Personal history of radiation therapy    Rheumatic fever    age 65   Sleep apnea    " very mild" does not wear CPAP   Thyroid disease    Hypothyroid   Ulcer    UTI (lower urinary tract infection)     Medications:  Scheduled:   atorvastatin  20 mg Oral Daily   levothyroxine  50 mcg Oral Q0600   [START ON 08/25/2022] pantoprazole  40 mg Intravenous Q12H   peg 3350 powder  0.5 kit Oral Once   And   [START ON 08/23/2022] peg 3350 powder  0.5 kit Oral Once   sodium chloride flush  3 mL Intravenous Q12H   tamoxifen  20 mg Oral Daily    Assessment: 82 yo female presented with RUQ abdominal pain. Hgb found to be 4.2 on admit and has received 3 units PRBC thus far. GI consulted and planning for EGD and colonoscopy on 5/9. Iron 6, Tsat 2, Ferritin 6. Pharmacy consulted to dose IV iron.  ~250mg  of iron per 1 unit PRBC - patient has received approximately 750mg  iron thus far. Will give one dose of IV  Ferrlecit to meet 1g total load.  Plan:  Ferrlecit 250mg  IV x1  Rexford Maus, PharmD, BCPS 08/22/2022 11:21 AM

## 2022-08-23 ENCOUNTER — Encounter (HOSPITAL_COMMUNITY): Payer: Self-pay | Admitting: Family Medicine

## 2022-08-23 ENCOUNTER — Inpatient Hospital Stay (HOSPITAL_COMMUNITY): Payer: Medicare Other | Admitting: Certified Registered Nurse Anesthetist

## 2022-08-23 ENCOUNTER — Encounter (HOSPITAL_COMMUNITY)
Admission: EM | Disposition: A | Discharge status: Home / Self Care | Payer: Self-pay | Source: Home / Self Care | Attending: Family Medicine

## 2022-08-23 DIAGNOSIS — K922 Gastrointestinal hemorrhage, unspecified: Secondary | ICD-10-CM | POA: Diagnosis not present

## 2022-08-23 DIAGNOSIS — K3189 Other diseases of stomach and duodenum: Secondary | ICD-10-CM

## 2022-08-23 DIAGNOSIS — D62 Acute posthemorrhagic anemia: Secondary | ICD-10-CM

## 2022-08-23 DIAGNOSIS — K259 Gastric ulcer, unspecified as acute or chronic, without hemorrhage or perforation: Secondary | ICD-10-CM

## 2022-08-23 DIAGNOSIS — E039 Hypothyroidism, unspecified: Secondary | ICD-10-CM

## 2022-08-23 DIAGNOSIS — D509 Iron deficiency anemia, unspecified: Secondary | ICD-10-CM

## 2022-08-23 DIAGNOSIS — I10 Essential (primary) hypertension: Secondary | ICD-10-CM

## 2022-08-23 DIAGNOSIS — K253 Acute gastric ulcer without hemorrhage or perforation: Secondary | ICD-10-CM

## 2022-08-23 DIAGNOSIS — R195 Other fecal abnormalities: Secondary | ICD-10-CM

## 2022-08-23 HISTORY — PX: ESOPHAGOGASTRODUODENOSCOPY (EGD) WITH PROPOFOL: SHX5813

## 2022-08-23 HISTORY — PX: BIOPSY: SHX5522

## 2022-08-23 HISTORY — PX: COLONOSCOPY: SHX5424

## 2022-08-23 LAB — CBC WITH DIFFERENTIAL/PLATELET
Abs Immature Granulocytes: 0.12 10*3/uL — ABNORMAL HIGH (ref 0.00–0.07)
Basophils Absolute: 0 10*3/uL (ref 0.0–0.1)
Basophils Relative: 0 %
Eosinophils Absolute: 0.2 10*3/uL (ref 0.0–0.5)
Eosinophils Relative: 3 %
HCT: 27.3 % — ABNORMAL LOW (ref 36.0–46.0)
Hemoglobin: 9.4 g/dL — ABNORMAL LOW (ref 12.0–15.0)
Immature Granulocytes: 1 %
Lymphocytes Relative: 16 %
Lymphs Abs: 1.5 10*3/uL (ref 0.7–4.0)
MCH: 26.7 pg (ref 26.0–34.0)
MCHC: 34.4 g/dL (ref 30.0–36.0)
MCV: 77.6 fL — ABNORMAL LOW (ref 80.0–100.0)
Monocytes Absolute: 1.1 10*3/uL — ABNORMAL HIGH (ref 0.1–1.0)
Monocytes Relative: 12 %
Neutro Abs: 6.3 10*3/uL (ref 1.7–7.7)
Neutrophils Relative %: 68 %
Platelets: 302 10*3/uL (ref 150–400)
RBC: 3.52 MIL/uL — ABNORMAL LOW (ref 3.87–5.11)
RDW: 15.9 % — ABNORMAL HIGH (ref 11.5–15.5)
WBC: 9.3 10*3/uL (ref 4.0–10.5)
nRBC: 0.8 % — ABNORMAL HIGH (ref 0.0–0.2)

## 2022-08-23 LAB — TYPE AND SCREEN
Antibody Screen: NEGATIVE
Unit division: 0

## 2022-08-23 LAB — BASIC METABOLIC PANEL
Anion gap: 8 (ref 5–15)
BUN: 8 mg/dL (ref 8–23)
CO2: 18 mmol/L — ABNORMAL LOW (ref 22–32)
Calcium: 7.9 mg/dL — ABNORMAL LOW (ref 8.9–10.3)
Chloride: 112 mmol/L — ABNORMAL HIGH (ref 98–111)
Creatinine, Ser: 0.89 mg/dL (ref 0.44–1.00)
GFR, Estimated: >60 mL/min (ref >60)
Glucose, Bld: 113 mg/dL — ABNORMAL HIGH (ref 70–99)
Potassium: 3.1 mmol/L — ABNORMAL LOW (ref 3.5–5.1)
Sodium: 138 mmol/L (ref 135–145)

## 2022-08-23 LAB — BPAM RBC
Blood Product Expiration Date: 202405132359
Blood Product Expiration Date: 202405142359
Blood Product Expiration Date: 202405162359
ISSUE DATE / TIME: 202405072051
ISSUE DATE / TIME: 202405080342
Unit Type and Rh: 9500

## 2022-08-23 SURGERY — ESOPHAGOGASTRODUODENOSCOPY (EGD) WITH PROPOFOL
Anesthesia: Monitor Anesthesia Care

## 2022-08-23 MED ORDER — PROPOFOL 500 MG/50ML IV EMUL
INTRAVENOUS | Status: DC | PRN
  Administered 2022-08-23: 100 ug/kg/min via INTRAVENOUS

## 2022-08-23 MED ORDER — POTASSIUM CHLORIDE 10 MEQ/100ML IV SOLN
10.0000 meq | INTRAVENOUS | Status: AC
  Filled 2022-08-23: qty 100

## 2022-08-23 MED ORDER — PROPOFOL 10 MG/ML IV BOLUS
INTRAVENOUS | Status: DC | PRN
  Administered 2022-08-23: 20 mg via INTRAVENOUS
  Administered 2022-08-23 (×4): 10 mg via INTRAVENOUS
  Administered 2022-08-23: 20 mg via INTRAVENOUS

## 2022-08-23 MED ORDER — PANTOPRAZOLE SODIUM 40 MG PO TBEC
40.0000 mg | DELAYED_RELEASE_TABLET | Freq: Two times a day (BID) | ORAL | Status: DC
  Administered 2022-08-23 – 2022-08-24 (×2): 40 mg via ORAL
  Filled 2022-08-23 (×4): qty 1

## 2022-08-23 MED ORDER — LACTATED RINGERS IV SOLN: INTRAVENOUS | Status: DC | PRN

## 2022-08-23 MED ORDER — PHENYLEPHRINE 80 MCG/ML (10ML) SYRINGE FOR IV PUSH (FOR BLOOD PRESSURE SUPPORT)
PREFILLED_SYRINGE | INTRAVENOUS | Status: DC | PRN
  Administered 2022-08-23 (×3): 80 ug via INTRAVENOUS

## 2022-08-23 SURGICAL SUPPLY — 15 items

## 2022-08-23 NOTE — Op Note (Signed)
[facility] Patient Name: [name] Procedure Date : 08/23/2022 MRN: [id number] Attending MD: Beverley Fiedler , MD, [id number] Date of Birth: Nov 23, 1940 CSN: [id number] Age: 82 Admit Type: Inpatient Procedure:                Upper GI endoscopy Indications:              Epigastric abdominal pain, Acute post hemorrhagic                            anemia, Iron deficiency anemia, Heme positive stool Providers:                Carie Caddy. Rhea Belton, MD, Glory Rosebush, RN, Harrington Challenger,                            Technician Referring MD:             [facility] Medicines:                Monitored Anesthesia Care Complications:            No immediate complications. Estimated Blood Loss:     Estimated blood loss was minimal. Procedure:                Pre-Anesthesia Assessment:                           - Prior to the procedure, a History and Physical                            was performed, and patient medications and                            allergies were reviewed. The patient's tolerance of                            previous anesthesia was also reviewed. The risks                            and benefits of the procedure and the sedation                            options and risks were discussed with the patient.                            All questions were answered, and informed consent                            was obtained. Prior Anticoagulants: The patient has                            taken no anticoagulant or antiplatelet agents. ASA                            Grade Assessment: III - A patient with severe  systemic disease. After reviewing the risks and                            benefits, the patient was deemed in satisfactory                            condition to undergo the procedure.                           After obtaining informed consent, the endoscope was                            passed under direct vision. Throughout  the                            procedure, the patient's blood pressure, pulse, and                            oxygen saturations were monitored continuously. The                            [id number]-[id number] ([id number]) Olympus endoscope was introduced                            through the mouth, and advanced to the duodenal                            bulb. The upper GI endoscopy was accomplished                            without difficulty. The patient tolerated the                            procedure well. Scope In: Scope Out: Findings:      The examined esophagus was normal.      One non-bleeding cratered gastric ulcer with pigmented material was       found in the prepyloric region of the stomach. The lesion was 20 mm in       largest dimension. This causes deformity of the prepyloric antrum and       bulb. Biopsies were taken with a cold forceps for histology from the       ulcer edges.      Normal duodenal bulb mucosa.      An examination of the 2nd portion of the duodenum was not performed due       to ulcer deformity as described above.      Biopsies were taken with a cold forceps in the gastric body and in the       gastric antrum for Helicobacter pylori testing. Impression:               - Normal esophagus.                           - Large non-bleeding gastric ulcer with pigmented  material causing deformity near pylorus (but not                            obstruction). Biopsied.                           - Bulb normal, but scope could not be advanced into                            the 2nd duodenum due to the ulcer (I did not                            repeatedly try as to not put scope pressure on the                            ulcer). Moderate Sedation:      N/A Recommendation:           - Return patient to hospital ward for ongoing care.                           - Soft diet.                           - Continue present medications. BID PPI x 12  weeks.                           - No aspirin, ibuprofen, naproxen, or other                            non-steroidal anti-inflammatory drugs.                           - Await pathology results.                           - Repeat upper endoscopy in 2 months to check                            healing.                           - See the other procedure note for documentation of                            additional recommendations. Procedure Code(s):        --- Professional ---                           [phone], Esophagogastroduodenoscopy, flexible,                            transoral; with biopsy, single or multiple Diagnosis Code(s):        --- Professional ---                           K25.9,  Gastric ulcer, unspecified as acute or                            chronic, without hemorrhage or perforation                           K31.89, Other diseases of stomach and duodenum                           R10.13, Epigastric pain                           D62, Acute posthemorrhagic anemia                           D50.9, Iron deficiency anemia, unspecified                           R19.5, Other fecal abnormalities CPT copyright 2022 [employer]. All rights reserved. The codes documented in this report are preliminary and upon coder review may  be revised to meet current compliance requirements. Beverley Fiedler, MD 08/23/2022 12:03:16 PM This report has been signed electronically. Number of Addenda: 0

## 2022-08-23 NOTE — Op Note (Signed)
[facility] Patient Name: [name] Procedure Date : 08/23/2022 MRN: [id number] Attending MD: Beverley Fiedler , MD, [id number] Date of Birth: 1940-05-24 CSN: [id number] Age: 82 Admit Type: Inpatient Procedure:                Colonoscopy Indications:              Heme positive stool, Iron deficiency anemia Providers:                Carie Caddy. Rhea Belton, MD, Glory Rosebush, RN, Harrington Challenger,                            Technician Referring MD:             [facility] Medicines:                Monitored Anesthesia Care Complications:            No immediate complications. Estimated Blood Loss:     Estimated blood loss: none. Procedure:                Pre-Anesthesia Assessment:                           - Prior to the procedure, a History and Physical                            was performed, and patient medications and                            allergies were reviewed. The patient's tolerance of                            previous anesthesia was also reviewed. The risks                            and benefits of the procedure and the sedation                            options and risks were discussed with the patient.                            All questions were answered, and informed consent                            was obtained. Prior Anticoagulants: The patient has                            taken no anticoagulant or antiplatelet agents. ASA                            Grade Assessment: III - A patient with severe                            systemic disease. After reviewing the risks and  benefits, the patient was deemed in satisfactory                            condition to undergo the procedure.                           After obtaining informed consent, the colonoscope                            was passed under direct vision. Throughout the                            procedure, the patient's blood pressure, pulse, and                             oxygen saturations were monitored continuously. The                            PCF-HQ190L ([id number]) Olympus peds colonoscope was                            introduced through the anus and advanced to the                            cecum, identified by appendiceal orifice and                            ileocecal valve. The colonoscopy was technically                            difficult and complex due to a tortuous colon. The                            patient tolerated the procedure well. The quality                            of the bowel preparation was good. The ileocecal                            valve, appendiceal orifice, and rectum were                            photographed. Scope In: 12:04:46 PM Scope Out: 12:34:50 PM Scope Withdrawal Time: 0 hours 12 minutes 9 seconds  Total Procedure Duration: 0 hours 30 minutes 4 seconds  Findings:      The digital rectal exam was normal.      The entire examined colon appeared normal on direct and retroflexion       views. Impression:               - The entire examined colon is normal on direct and                            retroflexion views.                           -  No specimens collected. Moderate Sedation:      N/A Recommendation:           - Return patient to hospital ward for ongoing care.                           - Advance diet as tolerated.                           - Continue present medications.                           - No repeat colonoscopy due to age and the absence                            of colonic polyps.                           - See the other procedure note for documentation of                            additional recommendations. Procedure Code(s):        --- Professional ---                           [phone], Colonoscopy, flexible; diagnostic, including                            collection of specimen(s) by brushing or washing,                            when performed (separate  procedure) Diagnosis Code(s):        --- Professional ---                           R19.5, Other fecal abnormalities                           D50.9, Iron deficiency anemia, unspecified CPT copyright 2022 American Medical Association. All rights reserved. The codes documented in this report are preliminary and upon coder review may  be revised to meet current compliance requirements. Beverley Fiedler, MD 08/23/2022 12:37:31 PM This report has been signed electronically. Number of Addenda: 0

## 2022-08-23 NOTE — Anesthesia Postprocedure Evaluation (Signed)
Anesthesia Post Note  Patient: [name]  Procedure(s) Performed: ESOPHAGOGASTRODUODENOSCOPY (EGD) WITH PROPOFOL COLONOSCOPY BIOPSY     Patient location during evaluation: Endoscopy Anesthesia Type: MAC Level of consciousness: awake and alert Pain management: pain level controlled Vital Signs Assessment: post-procedure vital signs reviewed and stable Respiratory status: spontaneous breathing, nonlabored ventilation and respiratory function stable Cardiovascular status: stable and blood pressure returned to baseline Postop Assessment: no apparent nausea or vomiting Anesthetic complications: no  No notable events documented.  Last Vitals:  Vitals:   08/23/22 1300 08/23/22 1315  BP: 102/69 109/88  Pulse: 87 92  Resp: 16 18  Temp:  (!) 36.1 C  SpO2: 96% 96%    Last Pain:  Vitals:   08/23/22 1300  TempSrc:   PainSc: 0-No pain                 Mardell Cragg,W. EDMOND

## 2022-08-23 NOTE — Procedures (Addendum)
I spoke to the patient's husband on his cell phone after the procedure I explained to him the findings of both upper and lower endoscopy as well as the treatment plan Time provided for questions and answers and he thanked me for the call

## 2022-08-23 NOTE — Progress Notes (Signed)
Patient tolerated movie prep last night and this am. Patient had multiple bowel movements, last bm was clear yellow.

## 2022-08-23 NOTE — Progress Notes (Signed)
PROGRESS NOTE    [name]  ZOX:[id number] DOB: [birth year] DOA: 08/21/2022 PCP: Kristian Covey, MD   Brief Narrative:  HPI: [name] is a 82 y.o. female with medical history significant of hypertension, hyperlipidemia, hypothyroidism, left breast cancer s/p lumpectomy with radiation, endometriosis, memory loss, and GERD who presents with complaints of right upper quadrant abdominal pain.  History is obtained from patient with assistance of her husband and son who are present at bedside.  Husband is that the patient has short-term memory loss for which she has not been formally diagnosed, but cannot recall being told something 5 minutes ago.  Husband also reports that she is intermittently asking for her mother and father who are deceased.  For the last 10 days patient has been complaining of abdominal pain.  Patient states pain is constant, but usually worsens after trying to eat.  She had not been able to eat or drink much of anything.  Husband notes associated symptoms of nausea and vomiting.  Emesis was reported to be brown in color, but patient denied having any blood present.  She reports having normal stools.  Family makes note that the patient takes Dramamine for sleep, Excedrin for pain, and Tums chews.  She is not on any other patient states she only takes 1 Excedrin per day but family is unable to verify.  Last colonoscopy appeared to have been in 2009.  Over the last 3 days pain seem to worsen and she was constantly crying in bed.  They tried to get her up to go to the doctor today but patient had a near syncopal episode after getting up to the steps.  Patient reportedly has had this pain for quite some time, but husband notes that the frequency and duration of symptoms has increased.   In the emergency department patient was noted to be a febrile with pulse 96-112, and all other vital signs maintained.  Labs significant for hemoglobin 4.2, potassium 3.2, BUN 20,  creatinine 1.01, and calcium 8.3.  Stool guaiacs were noted to be positive.  Right upper quadrant ultrasound noted mildly common bile duct.  Patient was typed and screened and ordered 2 units of packed red blood cells.  Patient had also been started on a Protonix drip.  Dr. Rhea Belton of [facility] have been consulted.  Assessment & Plan:   Principal Problem:   GI bleed Active Problems:   Acute blood loss anemia   RUQ pain   Hypokalemia   Chronic kidney disease, stage III (moderate) (HCC)   Hypertension   Short-term memory loss   Hypothyroidism   Hyperlipidemia   History of breast cancer   GIB (gastrointestinal bleeding)   Iron deficiency anemia due to chronic blood loss   Heme positive stool  Acute blood loss anemia secondary to GI bleed Found to have hemoglobin down to 4.2 with low MCV and MCH.  Hemoglobin previously had been 11.9 on 03/19/2022.  Stool guaiacs positive.  Patient did admit to using Excedrin.  S/p 3 unit PRBC transfusion, hemoglobin 9.4 today.  She denies any melena or hematemesis.  Likely upper GI bleed.  On Protonix drip.  Seen by GI.  Plan for colonoscopy and EGD today.   Right upper quadrant abdominal pain Ultrasound negative except mild CBD dilatation but LFTs normal, no need of MRCP.  Currently he has no pain or tenderness..    Hypokalemia Low again.  Replenished.   Chronic kidney disease stage IIIa At baseline.   Essential hypertension  Blood pressures currently maintained. -Held home blood pressure medications in the setting of acute blood loss.  Resume when medically appropriate.  Will treat with IV as needed hydralazine in the meantime.   Short-term memory loss Patient has pretty significant short-term memory loss, but husband notes that she has not formally been diagnosed with dementia.   Hypothyroidism -Continue levothyroxine   Hyperlipidemia -Continue atorvastatin  DVT prophylaxis: SCDs Start: 08/21/22 1715   Code Status: Full Code  Family  Communication: Husband and son present at bedside.  Plan of care discussed with patient in length and he/she verbalized understanding and agreed with it.  Status is: Inpatient Remains inpatient appropriate because: Scheduled for EGD and colonoscopy.  Will need PT OT assessment after that.     Estimated body mass index is 27.21 kg/m as calculated from the following:   Height as of this encounter: 5' (1.524 m).   Weight as of this encounter: 63.2 kg.    Nutritional Assessment: Body mass index is 27.21 kg/m.Marland Kitchen Seen by dietician.  I agree with the assessment and plan as outlined below: Nutrition Status:        . Skin Assessment: I have examined the patient's skin and I agree with the wound assessment as performed by the wound care RN as outlined below:    Consultants:  GI  Procedures:  None  Antimicrobials:  Anti-infectives (From admission, onward)    None         Subjective: Patient seen and examined earlier today.  Husband and son at the bedside.  Patient had no complaint. Objective: Vitals:   08/22/22 2049 08/23/22 0456 08/23/22 0929 08/23/22 1130  BP: (!) 154/55 (!) 141/50 (!) 144/60 (!) 158/53  Pulse: 90 97 94 95  Resp: (!) 21 17 18  (!) 22  Temp: 98.8 F (37.1 C) 97.7 F (36.5 C) 98.2 F (36.8 C) 97.7 F (36.5 C)  TempSrc: Oral Oral  Temporal  SpO2: 98% 99% 99% 99%  Weight:      Height:        Intake/Output Summary (Last 24 hours) at 08/23/2022 1213 Last data filed at 08/23/2022 0900 Gross per 24 hour  Intake 1292.81 ml  Output 51 ml  Net 1241.81 ml    Filed Weights   08/21/22 1837  Weight: 63.2 kg    Examination:  General exam: Appears calm and comfortable  Respiratory system: Clear to auscultation. Respiratory effort normal. Cardiovascular system: S1 & S2 heard, RRR. No JVD, murmurs, rubs, gallops or clicks. No pedal edema. Gastrointestinal system: Abdomen is nondistended, soft and nontender. No organomegaly or masses felt. Normal bowel  sounds heard. Central nervous system: Alert and oriented. No focal neurological deficits. Extremities: Symmetric 5 x 5 power. Skin: No rashes, lesions or ulcers.   Data Reviewed: I have personally reviewed following labs and imaging studies  CBC: Recent Labs  Lab 08/21/22 1415 08/21/22 1912 08/22/22 0926 08/23/22 0223  WBC 6.2  --  6.3 9.3  NEUTROABS 4.3  --   --  6.3  HGB 4.2* 4.4* 8.9* 9.4*  HCT 14.4* 14.8* 26.0* 27.3*  MCV 77.8*  --  77.4* 77.6*  PLT 299  --  276 302    Basic Metabolic Panel: Recent Labs  Lab 08/21/22 1415 08/22/22 0926 08/23/22 0223  NA 137 139 138  K 3.2* 3.6 3.1*  CL 104 111 112*  CO2 22 20* 18*  GLUCOSE 119* 107* 113*  BUN 20 14 8   CREATININE 1.01* 0.95 0.89  CALCIUM 8.3* 8.2* 7.9*  GFR: Estimated Creatinine Clearance: 40.5 mL/min (by C-G formula based on SCr of 0.89 mg/dL). Liver Function Tests: Recent Labs  Lab 08/21/22 1415 08/22/22 0926  AST 17 22  ALT 18 19  ALKPHOS 43 45  BILITOT 0.1* 2.1*  PROT 5.5* 5.5*  ALBUMIN 3.0* 3.1*    Recent Labs  Lab 08/21/22 1415  LIPASE 50    No results for input(s): "AMMONIA" in the last 168 hours. Coagulation Profile: Recent Labs  Lab 08/21/22 1912  INR 1.1    Cardiac Enzymes: No results for input(s): "CKTOTAL", "CKMB", "CKMBINDEX", "TROPONINI" in the last 168 hours. BNP (last 3 results) No results for input(s): "PROBNP" in the last 8760 hours. HbA1C: No results for input(s): "HGBA1C" in the last 72 hours. CBG: No results for input(s): "GLUCAP" in the last 168 hours. Lipid Profile: No results for input(s): "CHOL", "HDL", "LDLCALC", "TRIG", "CHOLHDL", "LDLDIRECT" in the last 72 hours. Thyroid Function Tests: No results for input(s): "TSH", "T4TOTAL", "FREET4", "T3FREE", "THYROIDAB" in the last 72 hours. Anemia Panel: Recent Labs    08/21/22 1605  VITAMINB12 446  FOLATE >40.0  FERRITIN 6*  TIBC 384  IRON 6*  RETICCTPCT 3.8*    Sepsis Labs: No results for input(s):  "PROCALCITON", "LATICACIDVEN" in the last 168 hours.  No results found for this or any previous visit (from the past 240 hour(s)).   Radiology Studies: US Abdomen Limited RUQ (LIVER/GB)  Result Date: 08/21/2022 CLINICAL DATA:  Abdominal pain EXAM: ULTRASOUND ABDOMEN LIMITED RIGHT UPPER QUADRANT COMPARISON:  None Available. FINDINGS: Gallbladder: No gallstones or wall thickening visualized. No sonographic Murphy sign noted by sonographer. Common bile duct: Diameter: 8 mm No intrahepatic biliary ductal dilation. Liver: No focal lesion identified. Increased parenchymal echogenicity. Portal vein is patent on color Doppler imaging with normal direction of blood flow towards the liver. Other: None. IMPRESSION: 1. Mildly dilated common bile duct. Recommend correlation with liver function tests. If liver function tests are abnormal, recommend further evaluation with MRCP. 2. No sonographic evidence of acute cholecystitis. 3. Hepatic steatosis. Electronically Signed   By: Allegra Lai M.D.   On: 08/21/2022 15:31    Scheduled Meds:  [MAR Hold] atorvastatin  20 mg Oral Daily   [MAR Hold] levothyroxine  50 mcg Oral Q0600   [MAR Hold] pantoprazole  40 mg Intravenous Q12H   [MAR Hold] sodium chloride flush  3 mL Intravenous Q12H   [MAR Hold] tamoxifen  20 mg Oral Daily   Continuous Infusions:  sodium chloride     pantoprazole 8 mg/hr (08/23/22 1136)   [MAR Hold] potassium chloride       LOS: 1 day   Hughie Closs, MD [facility]  08/23/2022, 12:13 PM   *Please note that this is a verbal dictation therefore any spelling or grammatical errors are due to the "Dragon [employer] One" system interpretation.  Please page via Amion and do not message via secure chat for urgent patient care matters. Secure chat can be used for non urgent patient care matters.  How to contact the [facility] Attending or Consulting provider 7A - 7P or covering provider during after hours 7P -7A, for this patient?  Check the  care team in [facility] and look for a) attending/consulting [facility] provider listed and b) the [facility] team listed. Page or secure chat 7A-7P. Log into [email or web address] and use [facility] universal password to access. If you do not have the password, please contact the hospital operator. Locate the [facility] provider you are looking for under [facility] and  page to a number that you can be directly reached. If you still have difficulty reaching the provider, please page the [facility] (Director on Call) for the Hospitalists listed on amion for assistance.

## 2022-08-23 NOTE — Anesthesia Preprocedure Evaluation (Addendum)
Anesthesia Evaluation  Patient identified by MRN, date of birth, ID band Patient awake    Reviewed: Allergy & Precautions, H&P , NPO status , Patient's Chart, lab work & pertinent test results, reviewed documented beta blocker date and time   Airway Mallampati: II  TM Distance: >3 FB Neck ROM: Full    Dental no notable dental hx. (+) Teeth Intact, Dental Advisory Given   Pulmonary neg pulmonary ROS   Pulmonary exam normal breath sounds clear to auscultation       Cardiovascular hypertension, Pt. on medications and Pt. on home beta blockers  Rhythm:Regular Rate:Normal     Neuro/Psych  Headaches  Anxiety        GI/Hepatic Neg liver ROS,GERD  Medicated,,  Endo/Other  Hypothyroidism    Renal/GU negative Renal ROS  negative genitourinary   Musculoskeletal  (+) Arthritis , Osteoarthritis,    Abdominal   Peds  Hematology  (+) Blood dyscrasia, anemia   Anesthesia Other Findings   Reproductive/Obstetrics negative OB ROS                             Anesthesia Physical Anesthesia Plan  ASA: 2  Anesthesia Plan: MAC   Post-op Pain Management: Minimal or no pain anticipated   Induction: Intravenous  PONV Risk Score and Plan: 2 and Propofol infusion and Treatment may vary due to age or medical condition  Airway Management Planned: Natural Airway and Nasal Cannula  Additional Equipment:   Intra-op Plan:   Post-operative Plan:   Informed Consent: I have reviewed the patients History and Physical, chart, labs and discussed the procedure including the risks, benefits and alternatives for the proposed anesthesia with the patient or authorized representative who has indicated his/her understanding and acceptance.     Dental advisory given  Plan Discussed with: CRNA  Anesthesia Plan Comments:        Anesthesia Quick Evaluation

## 2022-08-23 NOTE — Anesthesia Procedure Notes (Signed)
Procedure Name: MAC Date/Time: 08/23/2022 11:38 AM  Performed by: Garfield Cornea, CRNAPre-anesthesia Checklist: Patient identified, Emergency Drugs available, Suction available and Patient being monitored Patient Re-evaluated:Patient Re-evaluated prior to induction Oxygen Delivery Method: Nasal cannula Dental Injury: Teeth and Oropharynx as per pre-operative assessment

## 2022-08-23 NOTE — Interval H&P Note (Signed)
History and Physical Interval Note: For EGD and colonoscopy this morning to evaluate iron deficiency anemia and heme positive stool She tolerated the prep The nature of the procedure, as well as the risks, benefits, and alternatives were carefully and thoroughly reviewed with the patient. Ample time for discussion and questions allowed. The patient understood, was satisfied, and agreed to proceed.      Latest Ref Rng & Units 08/23/2022    2:23 AM 08/22/2022    9:26 AM 08/21/2022    7:12 PM  CBC  WBC 4.0 - 10.5 K/uL 9.3  6.3    Hemoglobin 12.0 - 15.0 g/dL 9.4  8.9  4.4   Hematocrit 36.0 - 46.0 % 27.3  26.0  14.8   Platelets 150 - 400 K/uL 302  276     Lab Results  Component Value Date   INR 1.1 08/21/2022   INR 1.0 03/05/2022     08/23/2022 11:27 AM  Orrin Brigham Andrada  has presented today for surgery, with the diagnosis of Anemia.  The various methods of treatment have been discussed with the patient and family. After consideration of risks, benefits and other options for treatment, the patient has consented to  Procedure(s): ESOPHAGOGASTRODUODENOSCOPY (EGD) WITH PROPOFOL (N/A) COLONOSCOPY (N/A) as a surgical intervention.  The patient's history has been reviewed, patient examined, no change in status, stable for surgery.  I have reviewed the patient's chart and labs.  Questions were answered to the patient's satisfaction.     Carie Caddy Lannah Koike

## 2022-08-23 NOTE — Transfer of Care (Signed)
Immediate Anesthesia Transfer of Care Note  Patient: [name]  Procedure(s) Performed: ESOPHAGOGASTRODUODENOSCOPY (EGD) WITH PROPOFOL COLONOSCOPY BIOPSY  Patient Location: PACU  Anesthesia Type:MAC  Level of Consciousness: awake and patient cooperative  Airway & Oxygen Therapy: Patient Spontanous Breathing  Post-op Assessment: Report given to RN and Post -op Vital signs reviewed and stable  Post vital signs: Reviewed and stable  Last Vitals:  Vitals Value Taken Time  BP 121/46 08/23/22 1242  Temp    Pulse 80 08/23/22 1244  Resp 20 08/23/22 1244  SpO2 99 % 08/23/22 1244  Vitals shown include unvalidated device data.  Last Pain:  Vitals:   08/23/22 1130  TempSrc: Temporal  PainSc: 0-No pain         Complications: No notable events documented.

## 2022-08-24 ENCOUNTER — Telehealth: Payer: Self-pay

## 2022-08-24 ENCOUNTER — Encounter: Payer: Self-pay | Admitting: Internal Medicine

## 2022-08-24 ENCOUNTER — Other Ambulatory Visit: Payer: Self-pay

## 2022-08-24 ENCOUNTER — Other Ambulatory Visit: Payer: Self-pay | Admitting: Internal Medicine

## 2022-08-24 DIAGNOSIS — K253 Acute gastric ulcer without hemorrhage or perforation: Secondary | ICD-10-CM

## 2022-08-24 DIAGNOSIS — K922 Gastrointestinal hemorrhage, unspecified: Secondary | ICD-10-CM | POA: Diagnosis not present

## 2022-08-24 LAB — CBC WITH DIFFERENTIAL/PLATELET
Abs Immature Granulocytes: 0.08 10*3/uL — ABNORMAL HIGH (ref 0.00–0.07)
Basophils Absolute: 0.1 10*3/uL (ref 0.0–0.1)
Basophils Relative: 1 %
Eosinophils Absolute: 0.4 10*3/uL (ref 0.0–0.5)
Eosinophils Relative: 5 %
HCT: 28.6 % — ABNORMAL LOW (ref 36.0–46.0)
Hemoglobin: 9.1 g/dL — ABNORMAL LOW (ref 12.0–15.0)
Immature Granulocytes: 1 %
Lymphocytes Relative: 19 %
Lymphs Abs: 1.5 10*3/uL (ref 0.7–4.0)
MCH: 26.1 pg (ref 26.0–34.0)
MCHC: 31.8 g/dL (ref 30.0–36.0)
MCV: 82.2 fL (ref 80.0–100.0)
Monocytes Absolute: 0.9 10*3/uL (ref 0.1–1.0)
Monocytes Relative: 11 %
Neutro Abs: 5 10*3/uL (ref 1.7–7.7)
Neutrophils Relative %: 63 %
Platelets: 356 10*3/uL (ref 150–400)
RBC: 3.48 MIL/uL — ABNORMAL LOW (ref 3.87–5.11)
RDW: 16.5 % — ABNORMAL HIGH (ref 11.5–15.5)
WBC: 7.9 10*3/uL (ref 4.0–10.5)
nRBC: 0.8 % — ABNORMAL HIGH (ref 0.0–0.2)

## 2022-08-24 LAB — BASIC METABOLIC PANEL
Anion gap: 10 (ref 5–15)
BUN: 5 mg/dL — ABNORMAL LOW (ref 8–23)
CO2: 21 mmol/L — ABNORMAL LOW (ref 22–32)
Calcium: 7.9 mg/dL — ABNORMAL LOW (ref 8.9–10.3)
Chloride: 108 mmol/L (ref 98–111)
Creatinine, Ser: 0.93 mg/dL (ref 0.44–1.00)
GFR, Estimated: >60 mL/min (ref >60)
Glucose, Bld: 127 mg/dL — ABNORMAL HIGH (ref 70–99)
Potassium: 2.8 mmol/L — ABNORMAL LOW (ref 3.5–5.1)
Sodium: 139 mmol/L (ref 135–145)

## 2022-08-24 LAB — SURGICAL PATHOLOGY

## 2022-08-24 MED ORDER — PANTOPRAZOLE SODIUM 40 MG PO TBEC: 40.0000 mg | DELAYED_RELEASE_TABLET | Freq: Two times a day (BID) | ORAL | 2 refills | Status: DC

## 2022-08-24 NOTE — Progress Notes (Signed)
OT Cancellation Note  Patient Details Name: [name] MRN: [id number] DOB: 01/31/1941   Cancelled Treatment:    Reason Eval/Treat Not Completed: Other (comment).  Patient getting dressed and preparing for discharge.  Family to provide assist as needed.    Cashtyn Pouliot D Sherrie Marsan 08/24/2022, 11:54 AM 08/24/2022  RP, OTR/L  Acute Rehabilitation Services  Office:  [phone]

## 2022-08-24 NOTE — Plan of Care (Signed)
  Problem: Education: Goal: Knowledge of General Education information will improve Description: Including pain rating scale, medication(s)/side effects and non-pharmacologic comfort measures Outcome: Adequate for Discharge   Problem: Health Behavior/Discharge Planning: Goal: Ability to manage health-related needs will improve Outcome: Adequate for Discharge   Problem: Clinical Measurements: Goal: Ability to maintain clinical measurements within normal limits will improve Outcome: Adequate for Discharge Goal: Will remain free from infection Outcome: Adequate for Discharge Goal: Diagnostic test results will improve Outcome: Adequate for Discharge Goal: Respiratory complications will improve Outcome: Adequate for Discharge Goal: Cardiovascular complication will be avoided Outcome: Adequate for Discharge   Problem: Activity: Goal: Risk for activity intolerance will decrease Outcome: Adequate for Discharge   Problem: Nutrition: Goal: Adequate nutrition will be maintained Outcome: Adequate for Discharge   Problem: Coping: Goal: Level of anxiety will decrease Outcome: Adequate for Discharge   Problem: Elimination: Goal: Will not experience complications related to bowel motility Outcome: Adequate for Discharge Goal: Will not experience complications related to urinary retention Outcome: Adequate for Discharge   Problem: Pain Managment: Goal: General experience of comfort will improve Outcome: Adequate for Discharge   Problem: Safety: Goal: Ability to remain free from injury will improve Outcome: Adequate for Discharge   Problem: Skin Integrity: Goal: Risk for impaired skin integrity will decrease Outcome: Adequate for Discharge   Problem: Acute Rehab PT Goals(only PT should resolve) Goal: Pt Will Ambulate Outcome: Adequate for Discharge Goal: Pt Will Go Up/Down Stairs Outcome: Adequate for Discharge Goal: PT Additional Goal #1 Outcome: Adequate for Discharge   

## 2022-08-24 NOTE — Progress Notes (Signed)
Patient and family given discharge instructions and verbalized understanding. Husband expressed concern about getting Protonix prescription through the primary pharmacy listed. MD sent a printed prescription for them. PIV x1 removed and patient assisted with dressing. Discharged home with family.

## 2022-08-24 NOTE — TOC Transition Note (Signed)
Transition of Care [facility]) - CM/SW Discharge Note   Patient Details  Name: [name] MRN: [id number] Date of Birth: 1940/11/08  Transition of Care [facility]) CM/SW Contact:  Tom-Johnson, Hershal Coria, RN Phone Number: 08/24/2022, 12:32 PM   Clinical Narrative:     Patient is scheduled for discharge today.  Readmission Risk Assessment done. Outpatient f/u, hospital f/u and discharge instructions on AVS. Prescription script given to patient to take to her pharmacy. No TOC needs or recommendations noted. Husband, Maisie Fus to transport at discharge.  No further TOC needs noted.       Final next level of care: Home/Self Care Barriers to Discharge: Barriers Resolved   Patient Goals and CMS Choice CMS Medicare.gov Compare Post Acute Care list provided to:: Patient Choice offered to / list presented to : Patient, Spouse  Discharge Placement                  Patient to be transferred to facility by: Husband Name of family member notified: [facility] Additional resources added to the After Visit Summary for     Discharge Planning Services: CM Consult Post Acute Care Choice: NA          DME Arranged: N/A DME Agency: NA       HH Arranged: NA HH Agency: NA        Social Determinants of Health (SDOH) Interventions SDOH Screenings   Food Insecurity: No Food Insecurity (05/22/2022)  Housing: Low Risk  (05/22/2022)  Transportation Needs: No Transportation Needs (05/22/2022)  Utilities: Not At Risk (05/22/2022)  Alcohol Screen: Low Risk  (03/16/2020)  Depression (PHQ2-9): Medium Risk (08/15/2022)  Financial Resource Strain: Low Risk  (05/22/2022)  Physical Activity: Insufficiently Active (03/17/2021)  Social Connections: Socially Integrated (05/22/2022)  Stress: No Stress Concern Present (05/22/2022)  Tobacco Use: Low Risk  (08/23/2022)     Readmission Risk Interventions    08/24/2022   12:31 PM  Readmission Risk Prevention Plan   Transportation Screening Complete  PCP or Specialist Appt within 5-7 Days Complete  Home Care Screening Complete  Medication Review (RN CM) Referral to Pharmacy

## 2022-08-24 NOTE — Discharge Summary (Addendum)
Physician Discharge Summary  [name] ZOX:[id number] DOB: 1940/09/03 DOA: 08/21/2022  PCP: Kristian Covey, MD  Admit date: 08/21/2022 Discharge date: 08/24/2022 30 Day Unplanned Readmission Risk Score    Flowsheet Row ED to Hosp-Admission (Current) from 08/21/2022 in [facility] 30M KIDNEY UNIT  30 Day Unplanned Readmission Risk Score (%) 15.4 Filed at 08/24/2022 0801       This score is the patient's risk of an unplanned readmission within 30 days of being discharged (0 -100%). The score is based on dignosis, age, lab data, medications, orders, and past utilization.   Low:  0-14.9   Medium: 15-21.9   High: 22-29.9   Extreme: 30 and above          Admitted From: Home Disposition: Home  Recommendations for Outpatient Follow-up:  Follow up with PCP in 1-2 weeks Please obtain BMP/CBC in one week Follow-up with GI in 8 weeks for repeat EGD Please follow up with your PCP on the following pending results: Unresulted Labs (From admission, onward)    None         Home Health: None Equipment/Devices: None  Discharge Condition: Stable CODE STATUS: Full code Diet recommendation: Cardiac  Subjective: Seen and examined, doing well.  No complaints.  She is excited to go home.  Brief/Interim Summary: [name] is a 82 y.o. female with medical history significant of hypertension, hyperlipidemia, hypothyroidism, left breast cancer s/p lumpectomy with radiation, endometriosis, memory loss, and GERD who presented with complaints of right upper quadrant abdominal pain.  Patient does carry history of memory deficit.  Husband notes associated symptoms of nausea and vomiting.  Emesis was reported to be brown in color, but patient denied having any blood present.  She denied melena.  Family makes note that the patient takes Dramamine for sleep, Excedrin for pain. Last colonoscopy in 2009.  Upon arrival to ED, she was hemodynamically stable however her hemoglobin was  found to be 4.2 with potassium 3.2 and she was tested positive for FOBT.  Right upper quadrant ultrasound was obtained which showed elevated CBD but LFTs were normal so MRCP was not indicated.  Patient was admitted under hospital service, GI consulted.  Patient received 3 units of PRBC transfusion, posttransfusion hemoglobin was over 8.  She underwent colonoscopy which was unremarkable as well as EGD on 08/23/2022 which showed large nonbleeding gastric ulcer with material causing deformity near pylorus and this was biopsied.  Patient's hemoglobin has remained stable for last 2 days.  GI recommended discharging on omeprazole 40 mg p.o. twice daily for 12 weeks and no aspirin, ibuprofen, any NSAIDs and repeat upper EGD in 2 months to check healing.  She is tolerating regular diet with no symptoms and she is cleared from GI for discharge.   Hypokalemia Low again.  Replenished.   Chronic kidney disease stage IIIa At baseline.   Essential hypertension Blood pressure stable, resume home medications.   Short-term memory loss Patient has pretty significant short-term memory loss, but husband notes that she has not formally been diagnosed with dementia.   Hypothyroidism -Continue levothyroxine   Hyperlipidemia -Continue atorvastatin  Discharge plan was discussed with patient and/or family member and they verbalized understanding and agreed with it.  Discharge Diagnoses:  Principal Problem:   GI bleed Active Problems:   Acute blood loss anemia   RUQ pain   Hypokalemia   Chronic kidney disease, stage III (moderate) (HCC)   Hypertension   Short-term memory loss   Hypothyroidism   Hyperlipidemia  History of breast cancer   GIB (gastrointestinal bleeding)   Iron deficiency anemia due to chronic blood loss   Heme positive stool   Acute gastric ulcer without hemorrhage or perforation    Discharge Instructions   Allergies as of 08/24/2022       Reactions   Nitrofurantoin Nausea And  Vomiting, Other (See Comments)   GI upset   Sulfamethoxazole-trimethoprim Nausea And Vomiting, Other (See Comments)   GI upset        Medication List     STOP taking these medications    Excedrin Migraine 250-250-65 MG tablet Generic drug: aspirin-acetaminophen-caffeine   famotidine 40 MG tablet Commonly known as: PEPCID   potassium chloride SA 20 MEQ tablet Commonly known as: KLOR-CON M   TURMERIC PO       TAKE these medications    amLODipine 5 MG tablet Commonly known as: NORVASC TAKE ONE TABLET BY MOUTH EVERY MORNING   atorvastatin 20 MG tablet Commonly known as: LIPITOR Take 1 tablet (20 mg total) by mouth daily.   Dramamine 50 MG tablet Generic drug: dimenhyDRINATE Take 150 mg by mouth at bedtime as needed (for sleep).   esomeprazole 40 MG capsule Commonly known as: NEXIUM TAKE ONE CAPSULE BY MOUTH ONCE DAILY   furosemide 20 MG tablet Commonly known as: LASIX TAKE ONE TABLET BY MOUTH EVERY MORNING   levothyroxine 50 MCG tablet Commonly known as: SYNTHROID TAKE ONE TABLET BY MOUTH BEFORE BREAKFAST What changed: See the new instructions.   losartan-hydrochlorothiazide 100-12.5 MG tablet Commonly known as: HYZAAR TAKE ONE TABLET BY MOUTH EVERY MORNING   Melatonin 3 MG Tbdp Take 3 mg by mouth at bedtime.   metoprolol tartrate 25 MG tablet Commonly known as: LOPRESSOR TAKE ONE TABLET BY MOUTH EVERY MORNING and TAKE ONE TABLET BY MOUTH EVERYDAY AT BEDTIME What changed: See the new instructions.   pantoprazole 40 MG tablet Commonly known as: PROTONIX Take 1 tablet (40 mg total) by mouth 2 (two) times daily before a meal.   tamoxifen 20 MG tablet Commonly known as: NOLVADEX Take 1 tablet (20 mg total) by mouth daily.        Follow-up Information     Kristian Covey, MD Follow up in 1 week(s).   Specialty: Family Medicine Contact information: [address] [place] [address] [phone]                Allergies   Allergen Reactions   Nitrofurantoin Nausea And Vomiting and Other (See Comments)    GI upset   Sulfamethoxazole-Trimethoprim Nausea And Vomiting and Other (See Comments)    GI upset    Consultations: GI   Procedures/Studies: US Abdomen Limited RUQ (LIVER/GB)  Result Date: 08/21/2022 CLINICAL DATA:  Abdominal pain EXAM: ULTRASOUND ABDOMEN LIMITED RIGHT UPPER QUADRANT COMPARISON:  None Available. FINDINGS: Gallbladder: No gallstones or wall thickening visualized. No sonographic Murphy sign noted by sonographer. Common bile duct: Diameter: 8 mm No intrahepatic biliary ductal dilation. Liver: No focal lesion identified. Increased parenchymal echogenicity. Portal vein is patent on color Doppler imaging with normal direction of blood flow towards the liver. Other: None. IMPRESSION: 1. Mildly dilated common bile duct. Recommend correlation with liver function tests. If liver function tests are abnormal, recommend further evaluation with MRCP. 2. No sonographic evidence of acute cholecystitis. 3. Hepatic steatosis. Electronically Signed   By: Allegra Lai M.D.   On: 08/21/2022 15:31     Discharge Exam: Vitals:   08/24/22 0357 08/24/22 0926  BP: Marland Kitchen)  141/49 (!) 138/51  Pulse: 76 78  Resp: 17 18  Temp: 98.3 F (36.8 C) 98.8 F (37.1 C)  SpO2: 97% 98%   Vitals:   08/23/22 1638 08/23/22 2032 08/24/22 0357 08/24/22 0926  BP: (!) 137/56 129/61 (!) 141/49 (!) 138/51  Pulse: 90 100 76 78  Resp: 18 18 17 18   Temp: 98 F (36.7 C) 98.7 F (37.1 C) 98.3 F (36.8 C) 98.8 F (37.1 C)  TempSrc:  Oral  Oral  SpO2: 98% 97% 97% 98%  Weight:      Height:        General: Pt is alert, awake, not in acute distress Cardiovascular: RRR, S1/S2 +, no rubs, no gallops Respiratory: CTA bilaterally, no wheezing, no rhonchi Abdominal: Soft, NT, ND, bowel sounds + Extremities: no edema, no cyanosis    The results of significant diagnostics from this hospitalization (including imaging, microbiology,  ancillary and laboratory) are listed below for reference.     Microbiology: No results found for this or any previous visit (from the past 240 hour(s)).   Labs: BNP (last 3 results) No results for input(s): "BNP" in the last 8760 hours. Basic Metabolic Panel: Recent Labs  Lab 08/21/22 1415 08/22/22 0926 08/23/22 0223 08/24/22 0941  NA 137 139 138 139  K 3.2* 3.6 3.1* 2.8*  CL 104 111 112* 108  CO2 22 20* 18* 21*  GLUCOSE 119* 107* 113* 127*  BUN 20 14 8  5*  CREATININE 1.01* 0.95 0.89 0.93  CALCIUM 8.3* 8.2* 7.9* 7.9*   Liver Function Tests: Recent Labs  Lab 08/21/22 1415 08/22/22 0926  AST 17 22  ALT 18 19  ALKPHOS 43 45  BILITOT 0.1* 2.1*  PROT 5.5* 5.5*  ALBUMIN 3.0* 3.1*   Recent Labs  Lab 08/21/22 1415  LIPASE 50   No results for input(s): "AMMONIA" in the last 168 hours. CBC: Recent Labs  Lab 08/21/22 1415 08/21/22 1912 08/22/22 0926 08/23/22 0223 08/24/22 0941  WBC 6.2  --  6.3 9.3 7.9  NEUTROABS 4.3  --   --  6.3 5.0  HGB 4.2* 4.4* 8.9* 9.4* 9.1*  HCT 14.4* 14.8* 26.0* 27.3* 28.6*  MCV 77.8*  --  77.4* 77.6* 82.2  PLT 299  --  276 302 356   Cardiac Enzymes: No results for input(s): "CKTOTAL", "CKMB", "CKMBINDEX", "TROPONINI" in the last 168 hours. BNP: Invalid input(s): "POCBNP" CBG: No results for input(s): "GLUCAP" in the last 168 hours. D-Dimer No results for input(s): "DDIMER" in the last 72 hours. Hgb A1c No results for input(s): "HGBA1C" in the last 72 hours. Lipid Profile No results for input(s): "CHOL", "HDL", "LDLCALC", "TRIG", "CHOLHDL", "LDLDIRECT" in the last 72 hours. Thyroid function studies No results for input(s): "TSH", "T4TOTAL", "T3FREE", "THYROIDAB" in the last 72 hours.  Invalid input(s): "FREET3" Anemia work up Recent Labs    08/21/22 1605  VITAMINB12 446  FOLATE >40.0  FERRITIN 6*  TIBC 384  IRON 6*  RETICCTPCT 3.8*   Urinalysis    Component Value Date/Time   COLORURINE YELLOW 08/21/2022 1900    APPEARANCEUR HAZY (A) 08/21/2022 1900   LABSPEC 1.015 08/21/2022 1900   PHURINE 5.0 08/21/2022 1900   GLUCOSEU NEGATIVE 08/21/2022 1900   HGBUR NEGATIVE 08/21/2022 1900   BILIRUBINUR NEGATIVE 08/21/2022 1900   BILIRUBINUR neg 05/01/2017 1706   KETONESUR NEGATIVE 08/21/2022 1900   PROTEINUR NEGATIVE 08/21/2022 1900   UROBILINOGEN 0.2 05/01/2017 1706   NITRITE NEGATIVE 08/21/2022 1900   LEUKOCYTESUR MODERATE (A) 08/21/2022 1900  Sepsis Labs Recent Labs  Lab 08/21/22 1415 08/22/22 0926 08/23/22 0223 08/24/22 0941  WBC 6.2 6.3 9.3 7.9   Microbiology No results found for this or any previous visit (from the past 240 hour(s)).   Time coordinating discharge: Over 30 minutes  SIGNED:   Hughie Closs, MD  [facility] 08/24/2022, 12:04 PM *Please note that this is a verbal dictation therefore any spelling or grammatical errors are due to the "Dragon [employer] One" system interpretation. If 7PM-7AM, please contact night-coverage [email or web address]

## 2022-08-24 NOTE — Progress Notes (Addendum)
Rounding is complete. Pt is asleep. She appears to be resting well. When asked if she has any needs. PT denies. RN will continue to monitor and round for changes.   Pt assisted to the [facility]. 300 ml amber urine. Vitals taken at this time. Pt BP 141/49 HR 78. Bed alarm activated personal items within reach. PO water encouraged and tolerated well.

## 2022-08-24 NOTE — Telephone Encounter (Signed)
Order and reminder in epic for lab in 2 weeks. Pt scheduled to see Hyacinth Meeker PA 6/141/24 at 1:30pm. Appt letter mailed to pt.

## 2022-08-24 NOTE — Progress Notes (Signed)
Mobility Specialist Progress Note   08/24/22 1112  Mobility  Activity Ambulated with assistance in hallway  Level of Assistance Contact guard assist, steadying assist  Assistive Device Four wheel walker (Rollator)  Distance Ambulated (ft) 196 ft  Activity Response Tolerated well  Mobility Referral Yes  $Mobility charge 1 Mobility  Mobility Specialist Start Time (ACUTE ONLY) 1112  Mobility Specialist Stop Time (ACUTE ONLY) 1125  Mobility Specialist Time Calculation (min) (ACUTE ONLY) 13 min   Pt found in bed having no complaints and agreeable. X1 seated break d/t fatigue. Returned back to bed w/o fault and pt prepping for d/c home.  Frederico Hamman Mobility Specialist Please contact via SecureChat or  Rehab office at [phone]

## 2022-08-24 NOTE — Care Management Important Message (Signed)
Important Message  Patient Details  Name: [name] MRN: [id number] Date of Birth: May 18, 1940   Medicare Important Message Given:  Yes     [name] [name] 08/24/2022, 3:29 PM

## 2022-08-24 NOTE — Progress Notes (Signed)
Patient hospitalized this week Significant iron deficiency anemia  Got 1 dose of Ferrlecit in the hospital, needs to complete therapy as an outpatient  Please dose and administer to complete full course of IV iron

## 2022-08-24 NOTE — Evaluation (Signed)
Physical Therapy Evaluation Patient Details Name: [name] MRN: [id number] DOB: 1941/01/12 Today's Date: 08/24/2022  History of Present Illness  82 y.o. female presents to [facility] hospital on 08/21/2022 with RUQ pain, with associated nausea and vomiting. Pt with acute anemia 2/2 GIB. Pt underwent upper GI endoscopy and colonoscopy on 5/9. PMH includes HTN, HLD, hypothyroidism, endometriosis, L breast cancer, memory loss, GERD.  Clinical Impression  Pt presents to PT with deficits in endurance, gait, power, strength, balance. Pt is able to ambulate for household distances, reports fatigue when mobilizing which seems understandable due to recent GIB and likely lack of mobilization during admission. Pt will benefit from frequent ambulation at this time, to aide in improving activity tolerance. PT recommends discharge home with assistance from family when medically appropriate.       Recommendations for follow up therapy are one component of a multi-disciplinary discharge planning process, led by the attending physician.  Recommendations may be updated based on patient status, additional functional criteria and insurance authorization.  Follow Up Recommendations       Assistance Recommended at Discharge PRN  Patient can return home with the following  A little help with bathing/dressing/bathroom;Assistance with cooking/housework;Assist for transportation;Help with stairs or ramp for entrance;Direct supervision/assist for medications management;Direct supervision/assist for financial management    Equipment Recommendations None recommended by PT  Recommendations for Other Services       Functional Status Assessment Patient has had a recent decline in their functional status and demonstrates the ability to make significant improvements in function in a reasonable and predictable amount of time.     Precautions / Restrictions Precautions Precautions: Fall Restrictions Weight Bearing  Restrictions: No      Mobility  Bed Mobility Overal bed mobility: Independent                  Transfers Overall transfer level: Independent                      Ambulation/Gait Ambulation/Gait assistance: Supervision Gait Distance (Feet): 100 Feet (100' x 2) Assistive device: None Gait Pattern/deviations: Step-through pattern Gait velocity: reduced Gait velocity interpretation: 1.31 - 2.62 ft/sec, indicative of limited community ambulator   General Gait Details: slowed step-through gait, PRN support of railing  Stairs            Wheelchair Mobility    Modified Rankin (Stroke Patients Only)       Balance Overall balance assessment: Needs assistance Sitting-balance support: No upper extremity supported, Feet supported Sitting balance-Leahy Scale: Good     Standing balance support: No upper extremity supported, During functional activity Standing balance-Leahy Scale: Good                               Pertinent Vitals/Pain Pain Assessment Pain Assessment: No/denies pain    Home Living Family/patient expects to be discharged to:: Private residence Living Arrangements: Spouse/significant other;Children Available Help at Discharge: Family;Available 24 hours/day Type of Home: House Home Access: Stairs to enter Entrance Stairs-Rails: Right Entrance Stairs-Number of Steps: 3   Home Layout: Able to live on main level with bedroom/bathroom;Laundry or work area in [employer]: None      Prior Function Prior Level of Function : Independent/Modified Independent;Driving             Mobility Comments: ambulatory without DME       Hand Dominance  Extremity/Trunk Assessment   Upper Extremity Assessment Upper Extremity Assessment: Overall WFL for tasks assessed    Lower Extremity Assessment Lower Extremity Assessment: Generalized weakness    Cervical / Trunk Assessment Cervical / Trunk Assessment:  Normal  Communication   Communication: No difficulties  Cognition Arousal/Alertness: Awake/alert Behavior During Therapy: WFL for tasks assessed/performed Overall Cognitive Status: Impaired/Different from baseline Area of Impairment: Orientation, Memory                 Orientation Level: Disoriented to, Time   Memory: Decreased short-term memory                  General Comments General comments (skin integrity, edema, etc.): VSS on RA    Exercises     Assessment/Plan    PT Assessment Patient needs continued PT services  PT Problem List Decreased strength;Decreased activity tolerance;Decreased balance;Decreased mobility;Decreased cognition;Decreased knowledge of use of DME       PT Treatment Interventions DME instruction;Gait training;Stair training;Functional mobility training;Therapeutic activities;Therapeutic exercise;Balance training;Neuromuscular re-education;Cognitive remediation;Patient/family education    PT Goals (Current goals can be found in the Care Plan section)  Acute Rehab PT Goals Patient Stated Goal: to return home PT Goal Formulation: With patient Time For Goal Achievement: 09/07/22 Potential to Achieve Goals: Good Additional Goals Additional Goal #1: Pt will score >19/24 on the DGI to indicate a reduced risk for falls    Frequency Min 3X/week     Co-evaluation               AM-PAC PT "6 Clicks" Mobility  Outcome Measure Help needed turning from your back to your side while in a flat bed without using bedrails?: None Help needed moving from lying on your back to sitting on the side of a flat bed without using bedrails?: None Help needed moving to and from a bed to a chair (including a wheelchair)?: None Help needed standing up from a chair using your arms (e.g., wheelchair or bedside chair)?: None Help needed to walk in hospital room?: A Little Help needed climbing 3-5 steps with a railing? : A Little 6 Click Score: 22    End  of Session Equipment Utilized During Treatment: Gait belt Activity Tolerance: Patient tolerated treatment well Patient left: in chair;with call bell/phone within reach;with chair alarm set Nurse Communication: Mobility status PT Visit Diagnosis: Other abnormalities of gait and mobility (R26.89);Muscle weakness (generalized) (M62.81)    Time: 4098-1191 PT Time Calculation (min) (ACUTE ONLY): 21 min   Charges:   PT Evaluation $PT Eval Low Complexity: 1 Low          Arlyss Gandy, PT, DPT Acute Rehabilitation Office [phone]   Arlyss Gandy 08/24/2022, 9:46 AM

## 2022-08-24 NOTE — Telephone Encounter (Signed)
-----   Message from Beverley Fiedler, MD sent at 08/24/2022 12:17 PM EDT ----- Patient discharged from the hospital Found to have large gastric ulcer  I ordered additional IV iron through the infusion center  Will need CBC in 2 weeks  Needs to be seen in 4 to 6 weeks with me or APP  Thank you

## 2022-08-24 NOTE — Care Management Important Message (Signed)
Important Message  Patient Details  Name: [name] MRN: [id number] Date of Birth: June 24, 1940   Medicare Important Message Given:  Yes     [name] 08/24/2022, 12:07 PM

## 2022-08-26 ENCOUNTER — Encounter (HOSPITAL_COMMUNITY): Payer: Self-pay | Admitting: Internal Medicine

## 2022-08-27 ENCOUNTER — Telehealth: Payer: Self-pay | Admitting: Family Medicine

## 2022-08-27 ENCOUNTER — Telehealth: Payer: Self-pay

## 2022-08-27 NOTE — Transitions of Care (Post Inpatient/ED Visit) (Addendum)
08/27/2022  Name: [name] MRN: [id number] DOB: 06-17-1940  Today's TOC FU Call Status: Today's TOC FU Call Status:: Successful TOC FU Call Competed TOC FU Call Complete Date: 08/27/22  Transition Care Management Follow-up Telephone Call Date of Discharge: 08/24/22 Discharge Facility: [facility]) Type of Discharge: Inpatient Admission Primary Inpatient Discharge Diagnosis:: "GI hemorrhage" How have you been since you were released from the hospital?: Same (pt states "pain in middle of stomach started back today." She describes pain as "a little sharp-comes and goes-not currently hurting.She has ate a good breakfast & lunch today. Denies any N&V. LBM was yesterday and normal per pt.Advised to f/u with GI MD) Any questions or concerns?: Yes Patient Questions/Concerns:: patient wants to know why she was told to stop taking Turmeric Patient Questions/Concerns Addressed: Other: (Pt states she will discuss with provider during follow up appt)  Items Reviewed: Did you receive and understand the discharge instructions provided?: Yes Medications obtained,verified, and reconciled?: Yes (Medications Reviewed) Any new allergies since your discharge?: No Dietary orders reviewed?: Yes Type of Diet Ordered:: low salt/heart healthy Do you have support at home?: Yes People in Home: spouse Name of Support/Comfort Primary Source: Maisie Fus  Medications Reviewed Today: Medications Reviewed Today     Reviewed by Charlyn Minerva, RN (Registered Nurse) on 08/27/22 at 1114  Med List Status: <None>   Medication Order Taking? Sig Documenting Provider Last Dose Status Informant  amLODipine (NORVASC) 5 MG tablet [id number] Yes TAKE ONE TABLET BY MOUTH EVERY MORNING Burchette, Elberta Fortis, MD Taking Active Self  atorvastatin (LIPITOR) 20 MG tablet [id number] Yes Take 1 tablet (20 mg total) by mouth daily. Kristian Covey, MD Taking Active Self  betamethasone acetate-betamethasone sodium  phosphate (CELESTONE) injection 12 mg [id number]   Tyrell Antonio, MD  Active   DRAMAMINE 50 MG tablet [id number] Yes Take 150 mg by mouth at bedtime as needed (for sleep). [provider] Taking Active Spouse/Significant Other  esomeprazole (NEXIUM) 40 MG capsule [id number] Yes TAKE ONE CAPSULE BY MOUTH ONCE DAILY Burchette, Elberta Fortis, MD Taking Active Self  furosemide (LASIX) 20 MG tablet [id number] Yes TAKE ONE TABLET BY MOUTH EVERY MORNING Burchette, Elberta Fortis, MD Taking Active Self  levothyroxine (SYNTHROID) 50 MCG tablet [id number] Yes TAKE ONE TABLET BY MOUTH BEFORE BREAKFAST  Patient taking differently: Take 50 mcg by mouth daily before breakfast.   Kristian Covey, MD Taking Active Self  losartan-hydrochlorothiazide (HYZAAR) 100-12.5 MG tablet [id number] Yes TAKE ONE TABLET BY MOUTH EVERY MORNING Kristian Covey, MD Taking Active Self  Melatonin 3 MG TBDP [id number] Yes Take 3 mg by mouth at bedtime. [provider] Taking Active Self  metoprolol tartrate (LOPRESSOR) 25 MG tablet [id number] Yes TAKE ONE TABLET BY MOUTH EVERY MORNING and TAKE ONE TABLET BY MOUTH EVERYDAY AT BEDTIME  Patient taking differently: Take 25 mg by mouth in the morning and at bedtime.   Kristian Covey, MD Taking Active Self  pantoprazole (PROTONIX) 40 MG tablet [id number] Yes Take 1 tablet (40 mg total) by mouth 2 (two) times daily before a meal. Hughie Closs, MD Taking Active   tamoxifen (NOLVADEX) 20 MG tablet [id number] Yes Take 1 tablet (20 mg total) by mouth daily. Rachel Moulds, MD Taking Active Self            Home Care and Equipment/Supplies: Were Home Health Services Ordered?: NA Any new equipment or medical supplies ordered?: NA  Functional Questionnaire: Do you need assistance with bathing/showering or dressing?: No Do  you need assistance with meal preparation?: No Do you need assistance with eating?: No Do you have difficulty maintaining continence: No Do you need  assistance with getting out of bed/getting out of a chair/moving?: No Do you have difficulty managing or taking your medications?: No  Follow up appointments reviewed: PCP Follow-up appointment confirmed?: Yes Date of PCP follow-up appointment?: 09/03/22 Follow-up Provider: Dr. Clent Ridges Specialist [facility] Follow-up appointment confirmed?: Yes Date of Specialist follow-up appointment?: 09/28/22 Follow-Up Specialty Provider:: Hyacinth Meeker Do you need transportation to your follow-up appointment?: No (pt confirms spouse will take her to appts) Do you understand care options if your condition(s) worsen?: Yes-patient verbalized understanding  SDOH Interventions Today    Flowsheet Row Most Recent Value  SDOH Interventions   Food Insecurity Interventions Intervention Not Indicated  Transportation Interventions Intervention Not Indicated      TOC Interventions Today    Flowsheet Row Most Recent Value  TOC Interventions   TOC Interventions Discussed/Reviewed TOC Interventions Discussed, Post discharge activity limitations per provider, Arranged PCP follow up within 7 days/Care Guide scheduled      Interventions Today    Flowsheet Row Most Recent Value  General Interventions   General Interventions Discussed/Reviewed General Interventions Discussed, Doctor Visits  Doctor Visits Discussed/Reviewed PCP, Specialist, Doctor Visits Discussed  PCP/Specialist Visits Compliance with follow-up visit  Education Interventions   Education Provided Provided Education  Provided Verbal Education On Nutrition, When to see the doctor, Medication, Other  [sx mgmt]  Nutrition Interventions   Nutrition Discussed/Reviewed Nutrition Discussed, Adding fruits and vegetables, Increaing proteins, Decreasing salt  Pharmacy Interventions   Pharmacy Dicussed/Reviewed Pharmacy Topics Discussed, Medications and their functions  Safety Interventions   Safety Discussed/Reviewed Safety Discussed       Alessandra Grout [facility] Care Management Care Management Community Coordinator Direct Phone: [phone] Toll Free: [phone] Fax: [phone]

## 2022-08-27 NOTE — Telephone Encounter (Signed)
Patients husband came in, [name] was discharged from the hospital and on her discharge papers she was instructed to stop taking famotidine 40mg  tablet, he is also concerned about the Excedrin Migraine is listed "as needed" he states was told to stop taking. A copy of her Hospital Summary is in the folder up front.

## 2022-08-27 NOTE — Telephone Encounter (Signed)
Entered in error

## 2022-08-27 NOTE — Telephone Encounter (Signed)
Auth Submission: NO AUTH NEEDED Site of care: Site of care: [facility] Payer: [employer] Medication & CPT/J Code(s) submitted: Venofer (Iron Sucrose) J1756 Route of submission (phone, fax, portal):  Phone # Fax # Auth type: Buy/Bill Units/visits requested: 4 Reference number:  Approval from: 08/27/2022 to 12/28/2022

## 2022-09-03 ENCOUNTER — Ambulatory Visit (INDEPENDENT_AMBULATORY_CARE_PROVIDER_SITE_OTHER): Payer: Medicare Other | Admitting: Family Medicine

## 2022-09-03 ENCOUNTER — Encounter: Payer: Self-pay | Admitting: Family Medicine

## 2022-09-03 VITALS — BP 130/62 | HR 110 | Temp 98.1°F | Wt 145.1 lb

## 2022-09-03 DIAGNOSIS — K253 Acute gastric ulcer without hemorrhage or perforation: Secondary | ICD-10-CM | POA: Diagnosis not present

## 2022-09-03 DIAGNOSIS — K922 Gastrointestinal hemorrhage, unspecified: Secondary | ICD-10-CM

## 2022-09-03 DIAGNOSIS — I10 Essential (primary) hypertension: Secondary | ICD-10-CM

## 2022-09-03 DIAGNOSIS — D62 Acute posthemorrhagic anemia: Secondary | ICD-10-CM | POA: Diagnosis not present

## 2022-09-03 DIAGNOSIS — E876 Hypokalemia: Secondary | ICD-10-CM | POA: Diagnosis not present

## 2022-09-03 LAB — BASIC METABOLIC PANEL
BUN: 9 mg/dL (ref 6–23)
CO2: 25 mEq/L (ref 19–32)
Calcium: 9.4 mg/dL (ref 8.4–10.5)
Chloride: 108 mEq/L (ref 96–112)
Creatinine, Ser: 0.76 mg/dL (ref 0.40–1.20)
GFR: 73.02 mL/min (ref >60.00)
Glucose, Bld: 121 mg/dL — ABNORMAL HIGH (ref 70–99)
Potassium: 3.7 mEq/L (ref 3.5–5.1)
Sodium: 144 mEq/L (ref 135–145)

## 2022-09-03 LAB — CBC WITH DIFFERENTIAL/PLATELET
Basophils Absolute: 0.1 10*3/uL (ref 0.0–0.1)
Basophils Relative: 1.5 % (ref 0.0–3.0)
Eosinophils Absolute: 0.3 10*3/uL (ref 0.0–0.7)
Eosinophils Relative: 5.7 % — ABNORMAL HIGH (ref 0.0–5.0)
HCT: 29.4 % — ABNORMAL LOW (ref 36.0–46.0)
Hemoglobin: 9.6 g/dL — ABNORMAL LOW (ref 12.0–15.0)
Lymphocytes Relative: 21 % (ref 12.0–46.0)
Lymphs Abs: 1.2 10*3/uL (ref 0.7–4.0)
MCHC: 32.7 g/dL (ref 30.0–36.0)
MCV: 86.5 fl (ref 78.0–100.0)
Monocytes Absolute: 0.6 10*3/uL (ref 0.1–1.0)
Monocytes Relative: 9.9 % (ref 3.0–12.0)
Neutro Abs: 3.6 10*3/uL (ref 1.4–7.7)
Neutrophils Relative %: 61.9 % (ref 43.0–77.0)
Platelets: 330 10*3/uL (ref 150.0–400.0)
RBC: 3.4 Mil/uL — ABNORMAL LOW (ref 3.87–5.11)
RDW: 26.6 % — ABNORMAL HIGH (ref 11.5–15.5)
WBC: 5.8 10*3/uL (ref 4.0–10.5)

## 2022-09-03 MED ORDER — POTASSIUM CHLORIDE ER 10 MEQ PO TBCR
10.0000 meq | EXTENDED_RELEASE_TABLET | Freq: Two times a day (BID) | ORAL | 5 refills | Status: DC
Start: 2022-09-03 — End: 2022-11-09

## 2022-09-03 NOTE — Progress Notes (Signed)
   Subjective:    Patient ID: [name], female    DOB: 03-Aug-1940, 82 y.o.   MRN: [id number]  HPI Here to follow up a hospital stay from 08-21-22 to 08-24-22 for upper abdominal pain. She was found to have a GI bleed, and her initial Hgb was 4.2. Her initial potassium was 3.2. She was given IV fluids and 3 units of PRBC. The Hgb was up to 9.1 by the time she went home. She had upper and lower endoscopies, with the colonoscopy being clear. The EGD showed a large gastric ulcer. Biopsies were benign and negative for H pylori. After she was stabilized she was sent home on Pantoprazole 40 mg BID and a bland diet. She feels much better with no abdominal pain at all. Her BM's are regular. She still feels weak. No nausea or vomiting, and appetite is good.    Review of Systems  Constitutional:  Positive for fatigue.  Respiratory: Negative.    Cardiovascular: Negative.   Gastrointestinal: Negative.   Genitourinary: Negative.        Objective:   Physical Exam Constitutional:      Appearance: Normal appearance. She is not ill-appearing.  Cardiovascular:     Rate and Rhythm: Normal rate and regular rhythm.     Pulses: Normal pulses.     Heart sounds: Normal heart sounds.  Pulmonary:     Effort: Pulmonary effort is normal.     Breath sounds: Normal breath sounds.  Abdominal:     General: Abdomen is flat. Bowel sounds are normal. There is no distension.     Palpations: Abdomen is soft. There is no mass.     Tenderness: There is no abdominal tenderness. There is no guarding or rebound.     Hernia: No hernia is present.  Neurological:     Mental Status: She is alert.           Assessment & Plan:  She is recover from a GO bleed leading to anemia. We will check a CBC today. She also had hypokalemia (she takes Lasix and HCTZ), but she was not sent home with any replacments. We will check a BMET today and start her on Klor-con 10 mEQ BID. She will follow up with GI on 09-28-22. We spent  a total of (32   ) minutes reviewing records and discussing these issues.  Gershon Crane, MD

## 2022-09-04 ENCOUNTER — Other Ambulatory Visit: Payer: Self-pay

## 2022-09-04 DIAGNOSIS — D62 Acute posthemorrhagic anemia: Secondary | ICD-10-CM

## 2022-09-05 ENCOUNTER — Telehealth: Payer: Self-pay | Admitting: Family Medicine

## 2022-09-05 NOTE — Telephone Encounter (Signed)
Pt's husband came in office, requesting a call from careteam about appointment pt had on 09/03/22. He states pt has memory problems and cannot remember what is discussed during appointment. He states pt is scheduled for an infusion on 09/06/22, however he is unsure what the infusion is for. He would like a call back at: [phone].

## 2022-09-06 ENCOUNTER — Emergency Department (HOSPITAL_COMMUNITY): Payer: Medicare Other

## 2022-09-06 ENCOUNTER — Ambulatory Visit (INDEPENDENT_AMBULATORY_CARE_PROVIDER_SITE_OTHER): Payer: Medicare Other

## 2022-09-06 ENCOUNTER — Emergency Department (HOSPITAL_COMMUNITY)
Admission: EM | Admit: 2022-09-06 | Discharge: 2022-09-06 | Disposition: A | Discharge status: Home / Self Care | Payer: Medicare Other | Attending: Emergency Medicine | Admitting: Emergency Medicine

## 2022-09-06 ENCOUNTER — Telehealth: Payer: Self-pay

## 2022-09-06 ENCOUNTER — Telehealth: Payer: Self-pay | Admitting: Pulmonary Disease

## 2022-09-06 VITALS — BP 108/72 | HR 88 | Temp 97.3°F | Resp 24 | Ht 60.0 in | Wt 145.4 lb

## 2022-09-06 DIAGNOSIS — T454X5A Adverse effect of iron and its compounds, initial encounter: Secondary | ICD-10-CM | POA: Diagnosis not present

## 2022-09-06 DIAGNOSIS — K253 Acute gastric ulcer without hemorrhage or perforation: Secondary | ICD-10-CM

## 2022-09-06 DIAGNOSIS — Z79899 Other long term (current) drug therapy: Secondary | ICD-10-CM | POA: Diagnosis not present

## 2022-09-06 DIAGNOSIS — I6782 Cerebral ischemia: Secondary | ICD-10-CM | POA: Diagnosis not present

## 2022-09-06 DIAGNOSIS — R400 Somnolence: Secondary | ICD-10-CM | POA: Diagnosis not present

## 2022-09-06 DIAGNOSIS — D500 Iron deficiency anemia secondary to blood loss (chronic): Secondary | ICD-10-CM

## 2022-09-06 DIAGNOSIS — I6523 Occlusion and stenosis of bilateral carotid arteries: Secondary | ICD-10-CM | POA: Insufficient documentation

## 2022-09-06 DIAGNOSIS — R531 Weakness: Secondary | ICD-10-CM | POA: Diagnosis not present

## 2022-09-06 DIAGNOSIS — T7840XA Allergy, unspecified, initial encounter: Secondary | ICD-10-CM | POA: Diagnosis not present

## 2022-09-06 DIAGNOSIS — R930 Abnormal findings on diagnostic imaging of skull and head, not elsewhere classified: Secondary | ICD-10-CM | POA: Diagnosis not present

## 2022-09-06 DIAGNOSIS — M25562 Pain in left knee: Secondary | ICD-10-CM | POA: Insufficient documentation

## 2022-09-06 DIAGNOSIS — R4182 Altered mental status, unspecified: Secondary | ICD-10-CM | POA: Diagnosis not present

## 2022-09-06 DIAGNOSIS — E041 Nontoxic single thyroid nodule: Secondary | ICD-10-CM | POA: Diagnosis not present

## 2022-09-06 DIAGNOSIS — R410 Disorientation, unspecified: Secondary | ICD-10-CM | POA: Insufficient documentation

## 2022-09-06 DIAGNOSIS — R29818 Other symptoms and signs involving the nervous system: Secondary | ICD-10-CM | POA: Diagnosis not present

## 2022-09-06 DIAGNOSIS — I10 Essential (primary) hypertension: Secondary | ICD-10-CM | POA: Diagnosis not present

## 2022-09-06 LAB — I-STAT CHEM 8, ED
BUN: 10 mg/dL (ref 8–23)
Calcium, Ion: 1.11 mmol/L — ABNORMAL LOW (ref 1.15–1.40)
Chloride: 110 mmol/L (ref 98–111)
Creatinine, Ser: 0.7 mg/dL (ref 0.44–1.00)
Glucose, Bld: 123 mg/dL — ABNORMAL HIGH (ref 70–99)
HCT: 34 % — ABNORMAL LOW (ref 36.0–46.0)
Hemoglobin: 11.6 g/dL — ABNORMAL LOW (ref 12.0–15.0)
Potassium: 3.8 mmol/L (ref 3.5–5.1)
Sodium: 142 mmol/L (ref 135–145)
TCO2: 22 mmol/L (ref 22–32)

## 2022-09-06 LAB — DIFFERENTIAL
Abs Immature Granulocytes: 0.02 10*3/uL (ref 0.00–0.07)
Basophils Absolute: 0.1 10*3/uL (ref 0.0–0.1)
Basophils Relative: 1 %
Eosinophils Absolute: 0.3 10*3/uL (ref 0.0–0.5)
Eosinophils Relative: 5 %
Immature Granulocytes: 0 %
Lymphocytes Relative: 15 %
Lymphs Abs: 1 10*3/uL (ref 0.7–4.0)
Monocytes Absolute: 0.2 10*3/uL (ref 0.1–1.0)
Monocytes Relative: 4 %
Neutro Abs: 4.8 10*3/uL (ref 1.7–7.7)
Neutrophils Relative %: 75 %

## 2022-09-06 LAB — COMPREHENSIVE METABOLIC PANEL
ALT: 20 U/L (ref 0–44)
AST: 30 U/L (ref 15–41)
Albumin: 3.4 g/dL — ABNORMAL LOW (ref 3.5–5.0)
Alkaline Phosphatase: 45 U/L (ref 38–126)
Anion gap: 9 (ref 5–15)
BUN: 12 mg/dL (ref 8–23)
CO2: 21 mmol/L — ABNORMAL LOW (ref 22–32)
Calcium: 8.8 mg/dL — ABNORMAL LOW (ref 8.9–10.3)
Chloride: 110 mmol/L (ref 98–111)
Creatinine, Ser: 0.85 mg/dL (ref 0.44–1.00)
GFR, Estimated: >60 mL/min (ref >60)
Glucose, Bld: 130 mg/dL — ABNORMAL HIGH (ref 70–99)
Potassium: 3.9 mmol/L (ref 3.5–5.1)
Sodium: 140 mmol/L (ref 135–145)
Total Bilirubin: 0.7 mg/dL (ref 0.3–1.2)
Total Protein: 5.8 g/dL — ABNORMAL LOW (ref 6.5–8.1)

## 2022-09-06 LAB — URINALYSIS, ROUTINE W REFLEX MICROSCOPIC
Bilirubin Urine: NEGATIVE
Glucose, UA: NEGATIVE mg/dL
Hgb urine dipstick: NEGATIVE
Ketones, ur: NEGATIVE mg/dL
Leukocytes,Ua: NEGATIVE
Nitrite: NEGATIVE
Protein, ur: NEGATIVE mg/dL
Specific Gravity, Urine: 1.023 (ref 1.005–1.030)
pH: 5 (ref 5.0–8.0)

## 2022-09-06 LAB — PROTIME-INR
INR: 1 (ref 0.8–1.2)
Prothrombin Time: 13.2 seconds (ref 11.4–15.2)

## 2022-09-06 LAB — RAPID URINE DRUG SCREEN, HOSP PERFORMED
Amphetamines: NOT DETECTED
Barbiturates: NOT DETECTED
Benzodiazepines: NOT DETECTED
Cocaine: NOT DETECTED
Opiates: NOT DETECTED
Tetrahydrocannabinol: NOT DETECTED

## 2022-09-06 LAB — ETHANOL: Alcohol, Ethyl (B): <10 mg/dL (ref <10)

## 2022-09-06 LAB — APTT: aPTT: 31 seconds (ref 24–36)

## 2022-09-06 LAB — CBC
HCT: 37.4 % (ref 36.0–46.0)
Hemoglobin: 11.2 g/dL — ABNORMAL LOW (ref 12.0–15.0)
MCH: 27.5 pg (ref 26.0–34.0)
MCHC: 29.9 g/dL — ABNORMAL LOW (ref 30.0–36.0)
MCV: 91.9 fL (ref 80.0–100.0)
Platelets: 386 10*3/uL (ref 150–400)
RBC: 4.07 MIL/uL (ref 3.87–5.11)
RDW: 23.9 % — ABNORMAL HIGH (ref 11.5–15.5)
WBC: 6.4 10*3/uL (ref 4.0–10.5)
nRBC: 0 % (ref 0.0–0.2)

## 2022-09-06 LAB — AMMONIA: Ammonia: 33 umol/L (ref 9–35)

## 2022-09-06 MED ORDER — SODIUM CHLORIDE 0.9 % IV SOLN: Freq: Once | INTRAVENOUS | Status: DC | PRN

## 2022-09-06 MED ORDER — METHYLPREDNISOLONE SODIUM SUCC 125 MG IJ SOLR
125.0000 mg | Freq: Once | INTRAMUSCULAR | Status: AC | PRN
  Administered 2022-09-06: 125 mg via INTRAVENOUS

## 2022-09-06 MED ORDER — ACETAMINOPHEN 325 MG PO TABS
650.0000 mg | ORAL_TABLET | Freq: Once | ORAL | Status: AC
  Administered 2022-09-06: 650 mg via ORAL
  Filled 2022-09-06: qty 2

## 2022-09-06 MED ORDER — ALBUTEROL SULFATE HFA 108 (90 BASE) MCG/ACT IN AERS: 2.0000 | INHALATION_SPRAY | Freq: Once | RESPIRATORY_TRACT | Status: DC | PRN

## 2022-09-06 MED ORDER — DIPHENHYDRAMINE HCL 50 MG/ML IJ SOLN
50.0000 mg | Freq: Once | INTRAMUSCULAR | Status: AC | PRN
  Administered 2022-09-06: 50 mg via INTRAVENOUS

## 2022-09-06 MED ORDER — EPINEPHRINE 0.3 MG/0.3ML IJ SOAJ: 0.3000 mg | Freq: Once | INTRAMUSCULAR | Status: DC | PRN

## 2022-09-06 MED ORDER — SODIUM CHLORIDE 0.9 % IV SOLN
250.0000 mg | Freq: Once | INTRAVENOUS | Status: AC
  Administered 2022-09-06: 250 mg via INTRAVENOUS
  Filled 2022-09-06: qty 20

## 2022-09-06 MED ORDER — DIPHENHYDRAMINE HCL 25 MG PO CAPS
25.0000 mg | ORAL_CAPSULE | Freq: Once | ORAL | Status: AC
  Administered 2022-09-06: 25 mg via ORAL
  Filled 2022-09-06: qty 1

## 2022-09-06 MED ORDER — FAMOTIDINE IN NACL 20-0.9 MG/50ML-% IV SOLN: 20.0000 mg | Freq: Once | INTRAVENOUS | Status: DC | PRN

## 2022-09-06 MED ORDER — IOHEXOL 350 MG/ML SOLN
75.0000 mL | Freq: Once | INTRAVENOUS | Status: AC | PRN
  Administered 2022-09-06: 75 mL via INTRAVENOUS

## 2022-09-06 NOTE — Telephone Encounter (Signed)
PCCM Update:  Called to the infusion center for drug reaction from IV iron (ferrlecit). She is complaining of 'odd' feeling in hands and feet. She did not have dyspnea upon first evaluation but this shortly developed along with generalized unwell feeling.   She has been given 125mg  solumedrol and IV benedryl 50mg .   I have called the [facility] ER team and notified them of her arrival EMS was called due to progressive nature of symptoms. She remained hemodynamically stable throughout multiple checks and on room air.   Recommend starting pantoprazole 40mg  IV BID given her recent GI bleeding and gastric ulcers with being steroid treatment for her allergic reaction.   Melody Comas, MD [facility] Office: [phone]   See Amion for personal pager PCCM on call pager [phone] until 7pm. Please call Elink 7p-7a. [phone]

## 2022-09-06 NOTE — Progress Notes (Signed)
Code Stroke Activated @1904  Patient already back from CT  mRS 1, LKWT 1315 Dr. Amada Jupiter paged @1909  Dr. Amada Jupiter on stroke cart @1913 

## 2022-09-06 NOTE — Progress Notes (Signed)
Care Management & Coordination Services Pharmacy Team  Reason for Encounter: Appointment Reminder  Contacted patient to confirm telephone appointment with Delano Metz, PharmD on 09/07/2022 at 1:30. Spoke with patient on 09/06/2022    Care Gaps: AWV - completed 04/26/2022 Last BP - 130/62 on 09/03/2022 Shingrix - overdue Covid - overdue   Star Rating Drugs: Losartan HCTZ 100/12.5 mg - last filled 08/16/2022 30 DS at [facility] Atorvastatin 20 mg - last filled 08/16/2022 30 DS at [facility]  Inetta Fermo [facility]  Clinical Pharmacist Assistant [phone]

## 2022-09-06 NOTE — Progress Notes (Unsigned)
Care Management & Coordination Services Pharmacy Note  09/06/2022 Name:  [name] [id number] MRN:  [id number] DOB:  April 02, 1941  Summary: ***  Recommendations/Changes made from today's visit: ***  Follow up plan: ***   Subjective: [name] is an 82 y.o. year old female who is a primary patient of Burchette, Elberta Fortis, MD.  The care coordination team was consulted for assistance with disease management and care coordination needs.    {CCMTELEPHONEFACETOFACE:21091510} for follow up visit.  Recent office visits: 09/03/22 Gershon Crane, MD -  For GI Hemorrhage and other concerns. START Potassium Chloride BID  08/15/22 Gershon Crane, MD - For Upper abdominal pain, no medication changes  07/02/2022 Anette Guarneri MD - Patient was seen for Mass of upper outer quadrant of right breast and an additional concern. Started Famotidine 40 mg daily and Discontinued Omeprazole.   04/26/2022 Kriste Basque DO - Patient was seen for Medicare annual wellness visit and and additional concern. No medication changes   03/05/2022 Evelena Peat MD - Patient was seen for physical exam and additional concerns. Discontinued Atenolol, Ondansetron and Simvastatin. Follow up in 6 months   Recent consult visits: 03/28/2022 Gertie Exon MD(OBGYN) - Patient was seen for history of hysteroscopy. No medication changes. No follow up noted.   02/19/2022 Marvia Pickles MD (OBGYN) - Patient was seen for thickented endometrium and additional concerns. Started Misoprostol 200 mcg prior to surgery.   Hospital visits: 08/21/22 [facility] - For GI Hemorrhage, LOS 3 days. STOP Excedrin Migraine, famotidine, potassium, and turmeric  03/19/22 [facility] - For dilatation and curettage/Hysteroscopy, LOS 3 hours, STOP misoprostol  Objective:  Lab Results  Component Value Date   CREATININE 0.76 09/03/2022   BUN 9 09/03/2022   GFR 73.02 09/03/2022   EGFR 46 (L) 01/03/2017   GFRNONAA >60  08/24/2022   GFRAA >60 03/26/2018   NA 144 09/03/2022   K 3.7 09/03/2022   CALCIUM 9.4 09/03/2022   CO2 25 09/03/2022   GLUCOSE 121 (H) 09/03/2022    Lab Results  Component Value Date/Time   HGBA1C 5.8 (A) 03/05/2022 02:13 PM   HGBA1C 6.3 10/23/2021 02:36 PM   HGBA1C 5.8 11/15/2014 09:24 AM   GFR 73.02 09/03/2022 02:26 PM   GFR 50.88 (L) 03/05/2022 02:23 PM    Last diabetic Eye exam: No results found for: "HMDIABEYEEXA"  Last diabetic Foot exam: No results found for: "HMDIABFOOTEX"   Lab Results  Component Value Date   CHOL 154 08/02/2021   HDL 47.80 08/02/2021   LDLDIRECT 74.0 08/02/2021   TRIG 325.0 (H) 08/02/2021   CHOLHDL 3 08/02/2021       Latest Ref Rng & Units 08/22/2022    9:26 AM 08/21/2022    2:15 PM 01/30/2022    3:47 AM  Hepatic Function  Total Protein 6.5 - 8.1 g/dL 5.5  5.5  7.0   Albumin 3.5 - 5.0 g/dL 3.1  3.0  4.4   AST 15 - 41 U/L 22  17  20    ALT 0 - 44 U/L 19  18  19    Alk Phosphatase 38 - 126 U/L 45  43  51   Total Bilirubin 0.3 - 1.2 mg/dL 2.1  0.1  0.3     Lab Results  Component Value Date/Time   TSH 1.38 08/02/2021 02:33 PM   TSH 1.48 08/01/2020 01:48 PM       Latest Ref Rng & Units 09/03/2022    2:26 PM 08/24/2022  9:41 AM 08/23/2022    2:23 AM  CBC  WBC 4.0 - 10.5 K/uL 5.8  7.9  9.3   Hemoglobin 12.0 - 15.0 g/dL 9.6  9.1  9.4   Hematocrit 36.0 - 46.0 % 29.4  28.6  27.3   Platelets 150.0 - 400.0 K/uL 330.0  356  302     Lab Results  Component Value Date/Time   VITAMINB12 446 08/21/2022 04:05 PM   VITAMINB12 627 10/23/2021 02:36 PM    Clinical ASCVD: No  The ASCVD Risk score (Arnett DK, et al., 2019) failed to calculate for the following reasons:   The 2019 ASCVD risk score is only valid for ages 67 to 51       08/15/2022   10:57 AM 07/02/2022    4:23 PM 05/22/2022   10:04 AM  Depression screen PHQ 2/9  Decreased Interest 0 0 0  Down, Depressed, Hopeless 0 1 0  PHQ - 2 Score 0 1 0  Altered sleeping 3 2   Tired, decreased  energy 3 1   Change in appetite 1 0   Feeling bad or failure about yourself  1 0   Trouble concentrating 0 0   Moving slowly or fidgety/restless 0 0   Suicidal thoughts 1 0   PHQ-9 Score 9 4   Difficult doing work/chores Somewhat difficult Not difficult at all      Social History   Tobacco Use  Smoking Status Never  Smokeless Tobacco Never   BP Readings from Last 3 Encounters:  09/03/22 130/62  08/24/22 (!) 138/51  08/15/22 126/68   Pulse Readings from Last 3 Encounters:  09/03/22 (!) 110  08/24/22 78  08/15/22 77   Wt Readings from Last 3 Encounters:  09/03/22 145 lb 2 oz (65.8 kg)  08/21/22 139 lb 5.3 oz (63.2 kg)  08/15/22 144 lb 12.8 oz (65.7 kg)   BMI Readings from Last 3 Encounters:  09/03/22 28.34 kg/m  08/21/22 27.21 kg/m  08/15/22 27.36 kg/m    Allergies  Allergen Reactions   Nitrofurantoin Nausea And Vomiting and Other (See Comments)    GI upset   Sulfamethoxazole-Trimethoprim Nausea And Vomiting and Other (See Comments)    GI upset    Medications Reviewed Today     Reviewed by Nelwyn Salisbury, MD (Physician) on 09/03/22 at 1402  Med List Status: <None>   Medication Order Taking? Sig Documenting Provider Last Dose Status Informant  amLODipine (NORVASC) 5 MG tablet [id number] No TAKE ONE TABLET BY MOUTH EVERY MORNING Burchette, Elberta Fortis, MD Taking Active Self  atorvastatin (LIPITOR) 20 MG tablet [id number] No Take 1 tablet (20 mg total) by mouth daily. Kristian Covey, MD Taking Active Self  betamethasone acetate-betamethasone sodium phosphate (CELESTONE) injection 12 mg [id number]   Tyrell Antonio, MD  Active   DRAMAMINE 50 MG tablet [id number] No Take 150 mg by mouth at bedtime as needed (for sleep). [provider] Taking Active Spouse/Significant Other  esomeprazole (NEXIUM) 40 MG capsule [id number] No TAKE ONE CAPSULE BY MOUTH ONCE DAILY Burchette, Elberta Fortis, MD Taking Active Self  furosemide (LASIX) 20 MG tablet [id number] No TAKE ONE  TABLET BY MOUTH EVERY MORNING Burchette, Elberta Fortis, MD Taking Active Self  levothyroxine (SYNTHROID) 50 MCG tablet [id number] No TAKE ONE TABLET BY MOUTH BEFORE BREAKFAST  Patient taking differently: Take 50 mcg by mouth daily before breakfast.   Kristian Covey, MD Taking Active Self  losartan-hydrochlorothiazide (HYZAAR) 100-12.5 MG tablet [id number] No TAKE ONE TABLET BY MOUTH  EVERY MORNING Burchette, Elberta Fortis, MD Taking Active Self  Melatonin 3 MG TBDP [id number] No Take 3 mg by mouth at bedtime. [provider] Taking Active Self  metoprolol tartrate (LOPRESSOR) 25 MG tablet [id number] No TAKE ONE TABLET BY MOUTH EVERY MORNING and TAKE ONE TABLET BY MOUTH EVERYDAY AT BEDTIME  Patient taking differently: Take 25 mg by mouth in the morning and at bedtime.   Kristian Covey, MD Taking Active Self  pantoprazole (PROTONIX) 40 MG tablet [id number] No Take 1 tablet (40 mg total) by mouth 2 (two) times daily before a meal. Hughie Closs, MD Taking Active   tamoxifen (NOLVADEX) 20 MG tablet [id number] No Take 1 tablet (20 mg total) by mouth daily. Rachel Moulds, MD Taking Active Self            SDOH:  (Social Determinants of Health) assessments and interventions performed: Yes SDOH Interventions    Flowsheet Row Telephone from 08/27/2022 in [facility] Community Care Coordination ED to Hosp-Admission (Discharged) from 08/21/2022 in [facility] Chronic Care Management from 05/22/2022 in [facility] [place] Chronic Care Management from 01/22/2022 in [facility] [place] Clinical Support from 03/16/2020 in [facility] [place]  SDOH Interventions       Food Insecurity Interventions Intervention Not Indicated -- Intervention Not Indicated -- Intervention Not Indicated  Housing Interventions -- -- Intervention Not Indicated -- Intervention Not Indicated  Transportation Interventions  Intervention Not Indicated Intervention Not Indicated, Inpatient TOC, Patient Resources (Friends/Family) Intervention Not Indicated -- Intervention Not Indicated  Utilities Interventions -- -- Intervention Not Indicated -- --  Alcohol Usage Interventions -- -- Intervention Not Indicated (Score <7) -- --  Depression Interventions/Treatment  -- -- -- -- PHQ2-9 Score <4 Follow-up Not Indicated  Financial Strain Interventions -- -- Intervention Not Indicated Intervention Not Indicated Intervention Not Indicated  Physical Activity Interventions -- -- -- -- Intervention Not Indicated  Stress Interventions -- -- Intervention Not Indicated -- Intervention Not Indicated  Social Connections Interventions -- -- Intervention Not Indicated -- Intervention Not Indicated       Medication Assistance: None required.  Patient affirms current coverage meets needs.  Medication Access: Name and location of current pharmacy:  [facility] [phone] - Closed - [place], [place] - [address] AT [facility] [address] [place] [address] Phone: [phone] Fax: [phone]  [facility] - [place], [place] - [address] [place] [address] Phone: [phone] Fax: [phone]  [facility] [place] - [address] [address] Phone: [phone] Fax: [phone]  Within the past 30 days, how often has patient missed a dose of medication? *** Is a pillbox or other method used to improve adherence? {YES/NO:21197} Factors that may affect medication adherence? {CHL DESC; BARRIERS:21522} Are meds synced by current pharmacy? Yes  Are meds delivered by current pharmacy? Yes  Does patient experience delays in picking up medications due to transportation concerns? 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%) -NYHA Class: II (slight limitation of activity) -AHA HF Stage:  C (Heart disease and symptoms present) -Current treatment: Lasix 20mg  1 qd Losartan-HCTZ 100-12.5mg  1 qd Metoprolol tartrate 25mg  1 BID -Medications previously tried: Atenolol  -Current home BP/HR readings: *** -Current home daily weights: *** -Current dietary habits: *** -Current exercise habits: *** -Educated on {CCM HF Counseling:25125} -{CCMPHARMDINTERVENTION:25122}  GERD (Goal: minimize symptoms of reflux ) -{US controlled/uncontrolled:25276} -Current treatment  *** -Medications previously tried: none reported  -Triggering factors: *** -Hx of Bleeds/ulcers: Yes -Counseled on small meals, elevating head, and sleeping on left side -{CCMPHARMDINTERVENTION:25122}  Sherrill Raring Clinical Pharmacist [phone]

## 2022-09-06 NOTE — Progress Notes (Signed)
Diagnosis: Iron Deficiency Anemia  Provider:  Chilton Greathouse MD  Procedure: IV Infusion  IV Type: Peripheral, IV Location: R Antecubital  Ferrlecit (ferric gluconate), Dose: 250 mg  Infusion Start Time: 1406  Infusion Stop Time: 1624  Post Infusion IV Care: Observation period completed. Patient c/o tingling/numbness in hands and feet and overall "odd feeling" during her 30 minute observation period. MD Charolett Bumpers (from pulmonary) notified. MD at bedside and assessed. Patient began to c/o of "not feeling right" Decision made by MD to send patient to ED. Patient began to c/o SOB. IV solumedrol and IV benadryl given. See MAR. EMS called. EMS arrived at [facility] IV left in place. Patient taken to ED by EMS.   Discharge: Condition: Serious, Destination: Sent to ED  . AVS Provided  Performed by:  Marilynn Rail, RN

## 2022-09-06 NOTE — Discharge Instructions (Signed)
Follow up with your family doc in the office.  Talk with them about your concern for all your meds.    I have given you follow up with neurology.

## 2022-09-06 NOTE — Telephone Encounter (Signed)
Spoke with pt husband verbalized understanding of the visit summary from pt visit

## 2022-09-06 NOTE — Telephone Encounter (Signed)
Left  a message for pt to call the office back regarding this message 

## 2022-09-06 NOTE — ED Provider Notes (Signed)
[place] EMERGENCY DEPARTMENT AT [facility] Provider Note   CSN: [id number] Arrival date & time: 09/06/22  1756     History  Chief Complaint  Patient presents with   Allergic Reaction    Teandra Milanowski Spagnoli is a 82 y.o. female.  82 yo F of not feeling right.  Patient does not really know what happened.  She cannot describe what was going on today.  She tells me that she knows that she was okay yesterday.  I received a call from pulmonologist who evaluated her after she had had an iron infusion.  She is being observed and reportedly had developed symptoms that were somewhat concerning for allergic reaction.  She was given Solu-Medrol and was sent here for evaluation.  Patient did not remember any of that.  She denies any itching denies difficulty breathing denies abdominal pain.  Says maybe she had trouble breathing earlier but she is not sure.  Denies vomiting or diarrhea.   Allergic Reaction      Home Medications Prior to Admission medications   Medication Sig Start Date End Date Taking? Authorizing Provider  amLODipine (NORVASC) 5 MG tablet TAKE ONE TABLET BY MOUTH EVERY MORNING 03/12/22  Yes Burchette, Elberta Fortis, MD  atorvastatin (LIPITOR) 20 MG tablet Take 1 tablet (20 mg total) by mouth daily. Patient taking differently: Take 20 mg by mouth at bedtime. 01/22/22  Yes Burchette, Elberta Fortis, MD  DRAMAMINE 50 MG tablet Take 150 mg by mouth at bedtime as needed (for sleep).   Yes [provider]  esomeprazole (NEXIUM) 40 MG capsule TAKE ONE CAPSULE BY MOUTH ONCE DAILY Patient taking differently: Take 40 mg by mouth daily before breakfast. 08/17/22  Yes Burchette, Elberta Fortis, MD  famotidine (PEPCID) 40 MG tablet Take 40 mg by mouth daily before supper.   Yes [provider]  furosemide (LASIX) 20 MG tablet TAKE ONE TABLET BY MOUTH EVERY MORNING 03/12/22  Yes Burchette, Elberta Fortis, MD  levothyroxine (SYNTHROID) 50 MCG tablet TAKE ONE TABLET BY MOUTH BEFORE  BREAKFAST Patient taking differently: Take 50 mcg by mouth daily before breakfast. 03/12/22  Yes Burchette, Elberta Fortis, MD  losartan-hydrochlorothiazide (HYZAAR) 100-12.5 MG tablet TAKE ONE TABLET BY MOUTH EVERY MORNING 03/12/22  Yes Burchette, Elberta Fortis, MD  metoprolol tartrate (LOPRESSOR) 25 MG tablet TAKE ONE TABLET BY MOUTH EVERY MORNING and TAKE ONE TABLET BY MOUTH EVERYDAY AT BEDTIME Patient taking differently: Take 25 mg by mouth in the morning and at bedtime. 03/12/22  Yes Burchette, Elberta Fortis, MD  Multiple Vitamins-Minerals (WOMENS MULTIVITAMIN) TABS Take 1 tablet by mouth at bedtime.   Yes [provider]  potassium chloride (KLOR-CON 10) 10 MEQ tablet Take 1 tablet (10 mEq total) by mouth 2 (two) times daily. 09/03/22  Yes Nelwyn Salisbury, MD  tamoxifen (NOLVADEX) 20 MG tablet Take 1 tablet (20 mg total) by mouth daily. 09/19/21  Yes Rachel Moulds, MD  TUMS 500 MG chewable tablet Chew 1-2 tablets by mouth daily as needed for indigestion or heartburn.   Yes [provider]  TYLENOL 325 MG tablet Take 325-650 mg by mouth every 6 (six) hours as needed for mild pain or headache.   Yes [provider]  Melatonin 3 MG TBDP Take 3 mg by mouth at bedtime. 01/20/18   [provider]  pantoprazole (PROTONIX) 40 MG tablet Take 1 tablet (40 mg total) by mouth 2 (two) times daily before a meal. 08/24/22 11/22/22  Hughie Closs, MD  Allergies    Ferrlecit [na ferric gluc cplx in sucrose], Nitrofurantoin, and Sulfamethoxazole-trimethoprim    Review of Systems   Review of Systems  Physical Exam Updated Vital Signs BP (!) 144/64 (BP Location: Right Arm)   Pulse 84   Temp 97.8 F (36.6 C) (Oral)   Resp 18   SpO2 95%  Physical Exam Vitals and nursing note reviewed.  Constitutional:      General: She is not in acute distress.    Appearance: She is well-developed. She is not diaphoretic.  HENT:     Head: Normocephalic and atraumatic.  Eyes:     Pupils: Pupils are  equal, round, and reactive to light.  Cardiovascular:     Rate and Rhythm: Normal rate and regular rhythm.     Heart sounds: No murmur heard.    No friction rub. No gallop.  Pulmonary:     Effort: Pulmonary effort is normal.     Breath sounds: No wheezing or rales.  Abdominal:     General: There is no distension.     Palpations: Abdomen is soft.     Tenderness: There is no abdominal tenderness.  Musculoskeletal:        General: No tenderness.     Cervical back: Normal range of motion and neck supple.  Skin:    General: Skin is warm and dry.  Neurological:     Mental Status: She is alert and oriented to person, place, and time.     GCS: GCS eye subscore is 4. GCS verbal subscore is 5. GCS motor subscore is 6.     Cranial Nerves: Cranial nerves 2-12 are intact.     Sensory: Sensation is intact.     Coordination: Coordination is intact.     Comments: Patient does not seem to recall what happened earlier.  She has left lower extremity weakness 4 out of 5 compared to 5 out of 5 on the right.    Psychiatric:        Behavior: Behavior normal.     ED Results / Procedures / Treatments   Labs (all labs ordered are listed, but only abnormal results are displayed) Labs Reviewed  CBC - Abnormal; Notable for the following components:      Result Value   Hemoglobin 11.2 (*)    MCHC 29.9 (*)    RDW 23.9 (*)    All other components within normal limits  COMPREHENSIVE METABOLIC PANEL - Abnormal; Notable for the following components:   CO2 21 (*)    Glucose, Bld 130 (*)    Calcium 8.8 (*)    Total Protein 5.8 (*)    Albumin 3.4 (*)    All other components within normal limits  I-STAT CHEM 8, ED - Abnormal; Notable for the following components:   Glucose, Bld 123 (*)    Calcium, Ion 1.11 (*)    Hemoglobin 11.6 (*)    HCT 34.0 (*)    All other components within normal limits  ETHANOL  PROTIME-INR  APTT  DIFFERENTIAL  RAPID URINE DRUG SCREEN, HOSP PERFORMED  URINALYSIS, ROUTINE W  REFLEX MICROSCOPIC  AMMONIA    EKG None  Radiology MR BRAIN WO CONTRAST  Result Date: 09/06/2022 CLINICAL DATA:  Acute neurologic deficit EXAM: MRI HEAD WITHOUT CONTRAST TECHNIQUE: Multiplanar, multiecho pulse sequences of the brain and surrounding structures were obtained without intravenous contrast. COMPARISON:  None Available. FINDINGS: Brain: No acute infarct, mass effect or extra-axial collection. No acute or chronic hemorrhage. There is multifocal  hyperintense T2-weighted signal within the white matter. Generalized volume loss. 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 The midline structures are normal. Vascular: Major flow voids are preserved. Skull and upper cervical spine: Normal calvarium and skull base. Visualized upper cervical spine and soft tissues are normal. Sinuses/Orbits:No paranasal sinus fluid levels or advanced mucosal thickening. No mastoid or middle ear effusion. Normal orbits. IMPRESSION: 1. No acute intracranial abnormality. 2. 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 Electronically Signed   By: Deatra Robinson M.D.   On: 09/06/2022 23:09   CT ANGIO HEAD NECK W WO CM (CODE STROKE)  Result Date: 09/06/2022 CLINICAL DATA:  Bilateral hand tingling EXAM: CT ANGIOGRAPHY HEAD AND NECK WITH AND WITHOUT CONTRAST TECHNIQUE: Multidetector CT imaging of the head and neck was performed using the standard protocol during bolus administration of intravenous contrast. Multiplanar CT image reconstructions and MIPs were obtained to evaluate the vascular anatomy. Carotid stenosis measurements (when applicable) are obtained utilizing NASCET criteria, using the distal internal carotid diameter as the denominator. RADIATION DOSE REDUCTION: This exam was performed according to the departmental dose-optimization program which includes automated exposure control, adjustment of the mA and/or kV according to patient size and/or use of iterative  reconstruction technique. CONTRAST:  75mL OMNIPAQUE IOHEXOL 350 MG/ML SOLN COMPARISON:  None Available. FINDINGS: CT HEAD FINDINGS Brain: There is no mass, hemorrhage or extra-axial collection. The size and configuration of the ventricles and extra-axial CSF spaces are normal. There is no acute or chronic infarction. The brain parenchyma is normal. Skull: The visualized skull base, calvarium and extracranial soft tissues are normal. Sinuses/Orbits: No fluid levels or advanced mucosal thickening of the visualized paranasal sinuses. No mastoid or middle ear effusion. The orbits are normal. CTA NECK FINDINGS SKELETON: There is no bony spinal canal stenosis. No lytic or blastic lesion. OTHER NECK: Normal pharynx, larynx and major salivary glands. No cervical lymphadenopathy. 2.2 cm hypodense nodule of the left thyroid. UPPER CHEST: No pneumothorax or pleural effusion. No nodules or masses. AORTIC ARCH: There is no calcific atherosclerosis of the aortic arch. There is no aneurysm, dissection or hemodynamically significant stenosis of the visualized portion of the aorta. Conventional 3 vessel aortic branching pattern. The visualized proximal subclavian arteries are widely patent. RIGHT CAROTID SYSTEM: No dissection, occlusion or aneurysm. Mild atherosclerotic calcification at the carotid bifurcation without hemodynamically significant stenosis. LEFT CAROTID SYSTEM: No dissection, occlusion or aneurysm. Mild atherosclerotic calcification at the carotid bifurcation without hemodynamically significant stenosis. VERTEBRAL ARTERIES: Right dominant configuration. Both origins are clearly patent. There is no dissection, occlusion or flow-limiting stenosis to the skull base (V1-V3 segments). CTA HEAD FINDINGS POSTERIOR CIRCULATION: --Vertebral arteries: Normal V4 segments. --Inferior cerebellar arteries: Normal. --Basilar artery: Normal. --Superior cerebellar arteries: Normal. --Posterior cerebral arteries (PCA): Normal. ANTERIOR  CIRCULATION: --Intracranial internal carotid arteries: Normal. --Anterior cerebral arteries (ACA): Normal. Both A1 segments are present. Patent anterior communicating artery (a-comm). --Middle cerebral arteries (MCA): Normal. VENOUS SINUSES: As permitted by contrast timing, patent. ANATOMIC VARIANTS: None Review of the MIP images confirms the above findings. IMPRESSION: 1. No emergent large vessel occlusion or hemodynamically significant stenosis of the head or neck. 2. Mild bilateral carotid bifurcation atherosclerosis without hemodynamically significant stenosis. 3. Incidental left thyroid nodule measuring 2.2 cm. Recommend non-emergent thyroid ultrasound. Reference: J Am Coll Radiol. 2015 Feb;12(2): 143-50 Electronically Signed   By: Deatra Robinson M.D.   On: 09/06/2022 20:00   CT HEAD CODE STROKE WO CONTRAST  Result Date: 09/06/2022 CLINICAL DATA:  Code stroke.  Neuro deficit, acute, stroke suspected EXAM: CT HEAD WITHOUT CONTRAST TECHNIQUE: Contiguous axial images were obtained from the base of the skull through the vertex without intravenous contrast. RADIATION DOSE REDUCTION: This exam was performed according to the departmental dose-optimization program which includes automated exposure control, adjustment of the mA and/or kV according to patient size and/or use of iterative reconstruction technique. COMPARISON:  None Available. FINDINGS: Brain: No evidence of acute large vascular territory infarct, acute hemorrhage, mass lesion, midline shift or hydrocephalus. Vascular: No hyperdense vessel identified Skull: No acute fracture. Sinuses/Orbits: Clear sinuses. No acute orbital findings. Motion limited. Other: No mastoid effusions. ASPECTS [facility] Stroke Program Early CT Score) total score (0-10 with 10 being normal): 10. IMPRESSION: 1. No evidence of acute large vascular territory infarct or acute hemorrhage. 2. ASPECTS is 10. Code stroke imaging results were communicated on 09/06/2022 at 6:40 pm to  provider Dr. Adela Lank via telephone, who verbally acknowledged these results. Electronically Signed   By: Feliberto Harts M.D.   On: 09/06/2022 18:41    Procedures Procedures    Medications Ordered in ED Medications  iohexol (OMNIPAQUE) 350 MG/ML injection 75 mL (75 mLs Intravenous Contrast Given 09/06/22 1940)    ED Course/ Medical Decision Making/ A&P                             Medical Decision Making Amount and/or Complexity of Data Reviewed Labs: ordered. Radiology: ordered.   82 yo F with a chief complaints of acute confusion.  This was noted at the infusion center after she had gotten her iron infusion.  There is some concern for an allergic reaction and she was sent here for evaluation.  Patient does not remember any of these things.  She has no obvious signs of anaphylaxis for me on exam.  She does have what seems like new left leg weakness.  Last known normal per the documentation seems like it was around 1700.  Will activate as a code stroke.  Patient's workup here is largely unremarkable.  CT of the head and neck discussed with the radiologist was negative for acute intracranial hemorrhage.  Seen by neurology CT angiogram head and neck negative for LVO.  Recommended MRI.  This is resulted and is without acute stroke.  She has a remote stroke.  Will refer to neurology.  UA negative for infection.  Ammonia level negative.  UDS negative.  No significant electrolyte abnormality.  Patient's hemoglobin has upward trended.  She was recently in the hospital for an upper GI bleed.   I discussed results with the patient and family.  Husband is very concerned about the number of medications that she is on.  Encouraged him to follow-up with their primary care provider for this.  Patient was reassessed and is feeling much better.  Perhaps her mental status change was due to the IV Benadryl.  11:23 PM:  I have discussed the diagnosis/risks/treatment options with the patient and family.   Evaluation and diagnostic testing in the emergency department does not suggest an emergent condition requiring admission or immediate intervention beyond what has been performed at this time.  They will follow up with PCP, neuro. We also discussed returning to the ED immediately if new or worsening sx occur. We discussed the sx which are most concerning (e.g., sudden worsening pain, fever, inability to tolerate by mouth) that necessitate immediate return. Medications administered to the patient during their visit and any  new prescriptions provided to the patient are listed below.  Medications given during this visit Medications  iohexol (OMNIPAQUE) 350 MG/ML injection 75 mL (75 mLs Intravenous Contrast Given 09/06/22 1940)     The patient appears reasonably screen and/or stabilized for discharge and I doubt any other medical condition or other [facility] requiring further screening, evaluation, or treatment in the ED at this time prior to discharge.          Final Clinical Impression(s) / ED Diagnoses Final diagnoses:  Confusion    Rx / DC Orders ED Discharge Orders          Ordered    Ambulatory referral to Neurology       Comments: Old stroke?   09/06/22 2320              Melene Plan, DO 09/06/22 2323

## 2022-09-06 NOTE — Consult Note (Signed)
[facility] Telemedicine Consult   Requesting Provider: Adela Lank, D Consult Participants: Patient, bedside nurse, telestroke nurse Location of the provider: [facility] Location of the patient: [facility]  This consult was provided via telemedicine with 2-way video and audio communication. The patient/family was informed that care would be provided in this way and agreed to receive care in this manner.    Chief Complaint: Leg weakness  HPI: 82 year old female who is receiving an iron infusion today.  She reports that even prior to the infusion, her husband was having to help her walk around a little bit, and she was feeling "off."  She complained of bilateral leg weakness while she was receiving the infusion and there was concern that she was having some type of allergic reaction and she was given Benadryl as well as Solu-Medrol.  She was then brought to the emergency department where she was evaluated and given the unclear history as well as possible acute altered mental status a code stroke was activated.  She states that her left leg goes numb regularly when she lays on it or sits on it for a prolonged period of time.  She also has significant left knee pain.    LKW: 1:15 PM tpa given?: No, outside of window IR Thrombectomy? No, low NIH Time of teleneurologist evaluation: 1913  Exam: Vitals:   09/06/22 1813 09/06/22 1818  BP:  (!) 135/51  Pulse: 89 92  Resp:  12  Temp: 98 F (36.7 C)   SpO2:  98%    General: In bed, NAD  1A: Level of Consciousness - 0 1B: Ask Month and Age - 1 1C: 'Blink Eyes' & 'Squeeze Hands' - 0 2: Test Horizontal Extraocular Movements - 0 3: Test Visual Fields - 0 4: Test Facial Palsy - 0 5A: Test Left Arm Motor Drift - 0 5B: Test Right Arm Motor Drift - 0 6A: Test Left Leg Motor Drift - 0 6B: Test Right Leg Motor Drift - 0 7: Test Limb Ataxia - 0 8: Test Sensation - 0 9: Test Language/Aphasia- 0 10: Test  Dysarthria - 0 11: Test Extinction/Inattention - 0 NIHSS score: 1   Imaging Reviewed: CT head-negative  Labs reviewed in epic and pertinent values follow: Creatinine 0.7   Assessment: 82 year old female who presents with confusion, feeling of being "off" that started today during iron infusion.  The fact that she regularly has left leg weakness and numbness after laying on it for a while would make me think that this is a chronic issue as opposed to an acute stroke.  She is confused, however, and it is difficult to know how much of this is an abrupt anticholinergic in an 82 year old versus how much of this is underlying pathology.  Given this, I would favor further evaluation with a CTA head and neck as well as an MRI of the brain.  I would also check urinalysis and ammonia for other causes of encephalopathy.  Recommendations:  1) CTA head and neck 2) MRI brain 3) UA, ammonia, uds 4) dispostion based upon above studies and clinical course    Ritta Slot, MD [facility] [phone]  If 7pm- 7am, please page neurology on call as listed in AMION.

## 2022-09-06 NOTE — ED Triage Notes (Signed)
Patient here via infusion clinic - iron infusion - reports allergic reaction. Given benadryl and solu-medrol  - now fatigued and restless.

## 2022-09-06 NOTE — ED Notes (Signed)
Telehealth Neurologist assessing patient.

## 2022-09-06 NOTE — Telephone Encounter (Signed)
Pt had an Abdominal US scheduled for 09/19/22, per Dr Clent Ridges request called [facility] and cancelled the [place] since pt had this done at the [facility]

## 2022-09-07 ENCOUNTER — Telehealth: Payer: Self-pay | Admitting: Hematology and Oncology

## 2022-09-07 ENCOUNTER — Ambulatory Visit: Payer: Self-pay | Admitting: *Deleted

## 2022-09-07 ENCOUNTER — Other Ambulatory Visit: Payer: Self-pay

## 2022-09-07 DIAGNOSIS — D509 Iron deficiency anemia, unspecified: Secondary | ICD-10-CM

## 2022-09-07 NOTE — Chronic Care Management (AMB) (Signed)
   09/07/2022  [name] 1940-12-10 [id number]  Patient not currently participating in chronic care management (CCM), status changed to previously enrolled.  Irving Shows [facility], BSN RN Case Manager [phone]

## 2022-09-07 NOTE — Telephone Encounter (Signed)
scheduled per referral, pt has been called and confirmed date and time. Pt is aware of location and to arrive early for check in   

## 2022-09-11 ENCOUNTER — Encounter: Payer: Self-pay | Admitting: Oncology

## 2022-09-11 ENCOUNTER — Telehealth: Payer: Self-pay

## 2022-09-11 ENCOUNTER — Encounter: Payer: Self-pay | Admitting: Internal Medicine

## 2022-09-11 NOTE — Progress Notes (Signed)
Care Management & Coordination Services Pharmacy Team  Reason for Encounter: Medication coordination and delivery  Contacted patient to discuss medications and coordinate delivery from [facility]. Spoke with patient on 09/11/2022   Cycle dispensing form sent to [facility] for review.  Last adherence delivery date: 08/20/2022      Patient is due for next adherence delivery on: 09/18/2022  This delivery to include: Adherence Packaging  30 Days  Womens 50+ MVI - take 1 tablet at bedtime Tamoxifen 20 mg - take 1 tablet at breakfast Levothyroxine 50 mcg - take 1 tablet before breakfast Furosemide 20 mg  - take 1 tablet at breakfast Atorvastatin 20 mg - take 1 tablet at bedtime Esomeprazole 40 mg - take 1 tablet at breakfast Metoprolol Tartrate 25 mg - take 1 tablet at breakfast and  bedtime Amlodipine 5 mg - take 1 tablet at breakfast Losartan HCTZ 100/12.5 mg - take 1 tablet at breakfast Famotidine 40 mg - take 1 tablet before dinner  Patient declined the following medications this month: No medications declined  Confirmed delivery date of 09/18/2022, advised patient that pharmacy will contact them the morning of delivery.  Any concerns about your medications? Patient denies  How often do you forget or accidentally miss a dose? Patient denies  Is patient in packaging Yes  If yes  What is the date on your next pill pack?  Any concerns or issues with your packaging?  Recent blood pressure readings are as follows: Patient states she has not be checking blood pressures.    Chart review: Recent office visits:  None  Recent consult visits:  None  Hospital visits:  Patient was seen at [facility] ED on 09/06/2022 due to Confusion.    New?Medications Started at [facility] Discharge:?? None Medication Changes at Hospital Discharge: None Medications Discontinued at Hospital Discharge: None Medications that remain the same after Hospital Discharge:??  -All other  medications will remain the same.    Medications: Outpatient Encounter Medications as of 09/11/2022  Medication Sig   amLODipine (NORVASC) 5 MG tablet TAKE ONE TABLET BY MOUTH EVERY MORNING   atorvastatin (LIPITOR) 20 MG tablet Take 1 tablet (20 mg total) by mouth daily. (Patient taking differently: Take 20 mg by mouth at bedtime.)   DRAMAMINE 50 MG tablet Take 150 mg by mouth at bedtime as needed (for sleep).   esomeprazole (NEXIUM) 40 MG capsule TAKE ONE CAPSULE BY MOUTH ONCE DAILY (Patient taking differently: Take 40 mg by mouth daily before breakfast.)   famotidine (PEPCID) 40 MG tablet Take 40 mg by mouth daily before supper.   furosemide (LASIX) 20 MG tablet TAKE ONE TABLET BY MOUTH EVERY MORNING   levothyroxine (SYNTHROID) 50 MCG tablet TAKE ONE TABLET BY MOUTH BEFORE BREAKFAST (Patient taking differently: Take 50 mcg by mouth daily before breakfast.)   losartan-hydrochlorothiazide (HYZAAR) 100-12.5 MG tablet TAKE ONE TABLET BY MOUTH EVERY MORNING   Melatonin 3 MG TBDP Take 3 mg by mouth at bedtime.   metoprolol tartrate (LOPRESSOR) 25 MG tablet TAKE ONE TABLET BY MOUTH EVERY MORNING and TAKE ONE TABLET BY MOUTH EVERYDAY AT BEDTIME (Patient taking differently: Take 25 mg by mouth in the morning and at bedtime.)   Multiple Vitamins-Minerals (WOMENS MULTIVITAMIN) TABS Take 1 tablet by mouth at bedtime.   pantoprazole (PROTONIX) 40 MG tablet Take 1 tablet (40 mg total) by mouth 2 (two) times daily before a meal.   potassium chloride (KLOR-CON 10) 10 MEQ tablet Take 1 tablet (10 mEq total) by mouth  2 (two) times daily.   tamoxifen (NOLVADEX) 20 MG tablet Take 1 tablet (20 mg total) by mouth daily.   TUMS 500 MG chewable tablet Chew 1-2 tablets by mouth daily as needed for indigestion or heartburn.   TYLENOL 325 MG tablet Take 325-650 mg by mouth every 6 (six) hours as needed for mild pain or headache.   Facility-Administered Encounter Medications as of 09/11/2022  Medication   betamethasone  acetate-betamethasone sodium phosphate (CELESTONE) injection 12 mg   BP Readings from Last 3 Encounters:  09/06/22 139/69  09/06/22 108/72  09/03/22 130/62    Pulse Readings from Last 3 Encounters:  09/06/22 84  09/06/22 88  09/03/22 (!) 110    Lab Results  Component Value Date/Time   HGBA1C 5.8 (A) 03/05/2022 02:13 PM   HGBA1C 6.3 10/23/2021 02:36 PM   HGBA1C 5.8 11/15/2014 09:24 AM   Lab Results  Component Value Date   CREATININE 0.70 09/06/2022   BUN 10 09/06/2022   GFR 73.02 09/03/2022   GFRNONAA >60 09/06/2022   GFRAA >60 03/26/2018   NA 142 09/06/2022   K 3.8 09/06/2022   CALCIUM 8.8 (L) 09/06/2022   CO2 21 (L) 09/06/2022   Inetta Fermo CMA  Clinical Pharmacist Assistant [phone]

## 2022-09-13 ENCOUNTER — Ambulatory Visit: Payer: Medicare Other

## 2022-09-13 ENCOUNTER — Encounter: Payer: Self-pay | Admitting: Neurology

## 2022-09-13 ENCOUNTER — Ambulatory Visit: Payer: Medicare Other | Admitting: Neurology

## 2022-09-13 VITALS — BP 158/75 | HR 93 | Ht 60.0 in | Wt 145.5 lb

## 2022-09-13 DIAGNOSIS — F02A0 Dementia in other diseases classified elsewhere, mild, without behavioral disturbance, psychotic disturbance, mood disturbance, and anxiety: Secondary | ICD-10-CM

## 2022-09-13 DIAGNOSIS — G3184 Mild cognitive impairment, so stated: Secondary | ICD-10-CM

## 2022-09-13 DIAGNOSIS — G301 Alzheimer's disease with late onset: Secondary | ICD-10-CM

## 2022-09-13 NOTE — Patient Instructions (Addendum)
ATN profile for evaluation of Alzheimer disease biomarker Continue current medications Follow-up with PCP Return in 1 year or sooner if worse.   There are well-accepted and sensible ways to reduce risk for Alzheimers disease and other degenerative brain disorders .  Exercise Daily Walk A daily 20 minute walk should be part of your routine. Disease related apathy can be a significant roadblock to exercise and the only way to overcome this is to make it a daily routine and perhaps have a reward at the end (something your loved one loves to eat or drink perhaps) or a personal trainer coming to the home can also be very useful. Most importantly, the patient is much more likely to exercise if the caregiver / spouse does it with him/her. In general a structured, repetitive schedule is best.  General Health: Any diseases which effect your body will effect your brain such as a pneumonia, urinary infection, blood clot, heart attack or stroke. Keep contact with your primary care doctor for regular follow ups.  Sleep. A good nights sleep is healthy for the brain. Seven hours is recommended. If you have insomnia or poor sleep habits we can give you some instructions. If you have sleep apnea wear your mask.  Diet: Eating a heart healthy diet is also a good idea; fish and poultry instead of red meat, nuts (mostly non-peanuts), vegetables, fruits, olive oil or canola oil (instead of butter), minimal salt (use other spices to flavor foods), whole grain rice, bread, cereal and pasta and wine in moderation.Research is now showing that the MIND diet, which is a combination of The Mediterranean diet and the DASH diet, is beneficial for cognitive processing and longevity. Information about this diet can be found in The MIND Diet, a book by Alonna Minium, MS, RDN, and online at [employer]Wild[employer].es  Finances, Power of [address] and Advance Directives: You should consider putting legal safeguards in  place with regard to financial and medical decision making. While the spouse always has power of attorney for medical and financial issues in the absence of any form, you should consider what you want in case the spouse / caregiver is no longer around or capable of making decisions.

## 2022-09-13 NOTE — Progress Notes (Signed)
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  Per patient and per husband a week ago she was getting her iron infusion for the first time and did have reaction; she had swelling in the legs and arms.  They presented to the ED, in the ED she was noted to be very confused and having difficulty answering questions.  Due to that she did have a MRI brain which showed no acute stroke.  Patient also have had CT angiogram head and neck with no large vessel occlusion.  She was referred to neurology for the ongoing confusion.  Husband reports that patient is now back to her normal self but at baseline she is very forgetful, asking the same questions over and over and being very repetitive.  At home she does not cook but husband reports that she left the stove on multiple times.  She would lose her train of thought during conversation.  She is not driving, husband pays the bill, she is able to manage in terms of self-care but husband reported her memory is affected.    OTHER MEDICAL CONDITIONS: Iron deficiency anemia,Hypertension, Hyperlipidemia, Hypothyroidism   REVIEW OF SYSTEMS: Full 14 system review of systems performed and negative with exception of: As noted in the HPI  ALLERGIES: Allergies  Allergen Reactions   Ferrlecit [Na Ferric Gluc Cplx In Sucrose] Shortness Of Breath and Other (See Comments)    Made the hands and feet feel odd, also   Nitrofurantoin  Nausea And Vomiting and Other (See Comments)    GI upset   Sulfamethoxazole-Trimethoprim Nausea And Vomiting and Other (See Comments)    GI upset    HOME MEDICATIONS: Outpatient Medications Prior to Visit  Medication Sig Dispense Refill   amLODipine (NORVASC) 5 MG tablet TAKE ONE TABLET BY MOUTH EVERY MORNING 90 tablet 3   atorvastatin (LIPITOR) 20 MG tablet Take 1 tablet (20 mg total) by mouth daily. (Patient taking differently: Take 20 mg by mouth at bedtime.) 90 tablet 3   DRAMAMINE 50 MG tablet Take 150 mg by mouth at bedtime as needed (for sleep).     esomeprazole (NEXIUM) 40 MG capsule TAKE ONE CAPSULE BY MOUTH ONCE DAILY (Patient taking differently: Take 40 mg by mouth daily before breakfast.) 90 capsule 1   famotidine (PEPCID) 40 MG tablet Take 40 mg by mouth daily before supper.     furosemide (LASIX) 20 MG tablet TAKE ONE TABLET BY MOUTH EVERY MORNING 90 tablet 3   levothyroxine (SYNTHROID) 50 MCG tablet TAKE ONE TABLET BY MOUTH BEFORE BREAKFAST (Patient taking differently: Take 50 mcg by mouth daily before breakfast.) 90 tablet 3   losartan-hydrochlorothiazide (HYZAAR) 100-12.5 MG tablet TAKE ONE TABLET BY MOUTH EVERY MORNING 90 tablet 3   Melatonin 3 MG TBDP Take 3 mg by mouth at bedtime.     metoprolol tartrate (LOPRESSOR) 25 MG tablet TAKE ONE TABLET BY MOUTH EVERY MORNING and TAKE ONE TABLET BY MOUTH EVERYDAY AT BEDTIME (Patient taking differently: Take 25 mg by mouth in  the morning and at bedtime.) 180 tablet 3   Multiple Vitamins-Minerals (WOMENS MULTIVITAMIN) TABS Take 1 tablet by mouth at bedtime.     pantoprazole (PROTONIX) 40 MG tablet Take 1 tablet (40 mg total) by mouth 2 (two) times daily before a meal. 60 tablet 2   potassium chloride (KLOR-CON 10) 10 MEQ tablet Take 1 tablet (10 mEq total) by mouth 2 (two) times daily. 60 tablet 5   tamoxifen (NOLVADEX) 20 MG tablet Take 1 tablet (20 mg total) by mouth daily. 30 tablet 12   TUMS 500 MG chewable tablet Chew 1-2 tablets  by mouth daily as needed for indigestion or heartburn.     TYLENOL 325 MG tablet Take 325-650 mg by mouth every 6 (six) hours as needed for mild pain or headache.     Facility-Administered Medications Prior to Visit  Medication Dose Route Frequency Provider Last Rate Last Admin   betamethasone acetate-betamethasone sodium phosphate (CELESTONE) injection 12 mg  12 mg Other Once Tyrell Antonio, MD        PAST MEDICAL HISTORY: Past Medical History:  Diagnosis Date   Anemia    Anxiety    Arthritis    knees   Atrophic vaginitis    Cancer (HCC)    breast cancer - left   Diverticulosis    Endometriosis    Family history of breast cancer    Family history of colon cancer    GERD (gastroesophageal reflux disease)    Headache(784.0)    Heart murmur    as a child only   History of kidney stones    passed stones, no surgery required   History of radiation therapy 01/30/16- 03/02/16   Left Breast 50 Gy in 25 fractions.    Hyperlipidemia    Hypertension    Hypothyroidism    Internal hemorrhoids    Osteopenia    Osteoporosis    Personal history of chemotherapy    Personal history of radiation therapy    Rheumatic fever    age 67   Sleep apnea    " very mild" does not wear CPAP   Thyroid disease    Hypothyroid   Ulcer    UTI (lower urinary tract infection)     PAST SURGICAL HISTORY: Past Surgical History:  Procedure Laterality Date   APPENDECTOMY     BIOPSY  08/23/2022   Procedure: BIOPSY;  Surgeon: Beverley Fiedler, MD;  Location: [facility] ENDOSCOPY;  Service: Gastroenterology;;   BREAST BIOPSY Left 08/19/2015   BREAST LUMPECTOMY Left 09/08/2015   BREAST LUMPECTOMY WITH RADIOACTIVE SEED AND SENTINEL LYMPH NODE BIOPSY Left 09/09/2015   Procedure: LEFT BREAST LUMPECTOMY WITH RADIOACTIVE SEED AND SENTINEL LYMPH NODE BIOPSY;  Surgeon: Avel Peace, MD;  Location: [facility] OR;  Service: General;  Laterality: Left;   COLONOSCOPY     COLONOSCOPY N/A 08/23/2022   Procedure: COLONOSCOPY;   Surgeon: Beverley Fiedler, MD;  Location: [facility] ENDOSCOPY;  Service: Gastroenterology;  Laterality: N/A;   DILATATION & CURETTAGE/HYSTEROSCOPY WITH MYOSURE N/A 10/09/2016   Procedure: DILATATION & CURETTAGE/HYSTEROSCOPY WITH MYOSURE;  Surgeon: Genia Del, MD;  Location: [facility] ORS;  Service: Gynecology;  Laterality: N/A;  requesting 7:30am in our block  requests one hour   DILATATION & CURETTAGE/HYSTEROSCOPY WITH MYOSURE N/A 03/19/2022   Procedure: DILATATION & CURETTAGE/HYSTEROSCOPY;  Surgeon: Romualdo Bolk, MD;  Location: [facility];  Service: Gynecology;  Laterality: N/A;   ESOPHAGOGASTRODUODENOSCOPY (EGD) WITH PROPOFOL N/A 08/23/2022   Procedure: ESOPHAGOGASTRODUODENOSCOPY (EGD) WITH PROPOFOL;  Surgeon: Rhea Belton,  Carie Caddy, MD;  Location: [facility] ENDOSCOPY;  Service: Gastroenterology;  Laterality: N/A;   ESOPHAGOGASTRODUODENOSCOPY ENDOSCOPY     IR GENERIC HISTORICAL  12/05/2015   IR CV LINE INJECTION 12/05/2015 Jolaine Click, MD [facility]-INTERV RAD   LAPAROSCOPIC ENDOMETRIOSIS FULGURATION  1978   PELVIC LAPAROSCOPY     PORTACATH PLACEMENT N/A 10/04/2015   Procedure: INSERTION PORT-A-CATH WITH ULTRASOUND;  Surgeon: Avel Peace, MD;  Location: [facility] OR;  Service: General;  Laterality: N/A;   portacath removal     TONSILLECTOMY     ULNAR NERVE REPAIR  2010    FAMILY HISTORY: Family History  Problem Relation Age of Onset   Breast cancer Mother        Age 71   Hypertension Mother    Heart disease Father    Stroke Father    Colon cancer Father        dx 92s   Breast cancer Paternal Grandmother        Age 1   Diabetes Paternal Grandmother    Heart disease Maternal Grandmother     SOCIAL HISTORY: Social History   Socioeconomic History   Marital status: Married    Spouse name: Not on file   Number of children: 0   Years of education: Not on file   Highest education level: Not on file  Occupational History   Occupation: retired  Tobacco Use   Smoking status: Never   Smokeless  tobacco: Never  Vaping Use   Vaping Use: Never used  Substance and Sexual Activity   Alcohol use: No   Drug use: No   Sexual activity: Not Currently    Birth control/protection: Post-menopausal  Other Topics Concern   Not on file  Social History Narrative   Retired Midwife.    Social Determinants of Health   Financial Resource Strain: Low Risk  (05/22/2022)   Overall Financial Resource Strain (CARDIA)    Difficulty of Paying Living Expenses: Not very hard  Food Insecurity: No Food Insecurity (08/27/2022)   Hunger Vital Sign    Worried About Running Out of Food in the Last Year: Never true    Ran Out of Food in the Last Year: Never true  Transportation Needs: No Transportation Needs (08/27/2022)   PRAPARE - Administrator, Civil Service (Medical): No    Lack of Transportation (Non-Medical): No  Physical Activity: Insufficiently Active (03/17/2021)   Exercise Vital Sign    Days of Exercise per Week: 3 days    Minutes of Exercise per Session: 10 min  Stress: No Stress Concern Present (05/22/2022)   [employer] of Occupational Health - Occupational Stress Questionnaire    Feeling of Stress : Not at all  Social Connections: Socially Integrated (05/22/2022)   Social Connection and Isolation Panel [NHANES]    Frequency of Communication with Friends and Family: More than three times a week    Frequency of Social Gatherings with Friends and Family: More than three times a week    Attends Religious Services: More than 4 times per year    Active Member of [employer] or Organizations: Yes    Attends Banker Meetings: More than 4 times per year    Marital Status: Married  Catering manager Violence: Not At Risk (03/17/2021)   Humiliation, Afraid, Rape, and Kick questionnaire    Fear of Current or Ex-Partner: No    Emotionally Abused: No    Physically Abused: No    Sexually Abused: No  PHYSICAL EXAM  GENERAL EXAM/CONSTITUTIONAL: Vitals:  Vitals:    09/13/22 0808  BP: (!) 158/75  Pulse: 93  Weight: 145 lb 8 oz (66 kg)  Height: 5' (1.524 m)   Body mass index is 28.42 kg/m. Wt Readings from Last 3 Encounters:  09/13/22 145 lb 8 oz (66 kg)  09/06/22 145 lb 6.4 oz (66 kg)  09/03/22 145 lb 2 oz (65.8 kg)   Patient is in no distress; well developed, nourished and groomed; neck is supple  MUSCULOSKELETAL: Gait, strength, tone, movements noted in Neurologic exam below  NEUROLOGIC: MENTAL STATUS:      No data to display            09/13/2022    8:11 AM  Montreal Cognitive Assessment   Visuospatial/ Executive (0/5) 3  Naming (0/3) 3  Attention: Read list of digits (0/2) 2  Attention: Read list of letters (0/1) 1  Attention: Serial 7 subtraction starting at 100 (0/3) 2  Language: Repeat phrase (0/2) 2  Language : Fluency (0/1) 1  Abstraction (0/2) 1  Delayed Recall (0/5) 0  Orientation (0/6) 3  Total 18     CRANIAL NERVE:  2nd, 3rd, 4th, 6th - Visual fields full to confrontation, extraocular muscles intact, no nystagmus 5th - facial sensation symmetric 7th - facial strength symmetric 8th - hearing intact 9th - palate elevates symmetrically, uvula midline 11th - shoulder shrug symmetric 12th - tongue protrusion midline  MOTOR:  normal bulk and tone, full strength in the BUE, BLE  SENSORY:  normal and symmetric to light touch  COORDINATION:  finger-nose-finger, fine finger movements normal  REFLEXES:  deep tendon reflexes present and symmetric  GAIT/STATION:  normal   DIAGNOSTIC DATA (LABS, IMAGING, TESTING) - I reviewed patient records, labs, notes, testing and imaging myself where available.  Lab Results  Component Value Date   WBC 6.4 09/06/2022   HGB 11.6 (L) 09/06/2022   HCT 34.0 (L) 09/06/2022   MCV 91.9 09/06/2022   PLT 386 09/06/2022      Component Value Date/Time   NA 142 09/06/2022 1856   NA 140 01/03/2017 1052   K 3.8 09/06/2022 1856   K 4.5 01/03/2017 1052   CL 110 09/06/2022  1856   CO2 21 (L) 09/06/2022 1853   CO2 27 01/03/2017 1052   GLUCOSE 123 (H) 09/06/2022 1856   GLUCOSE 114 01/03/2017 1052   BUN 10 09/06/2022 1856   BUN 25.5 01/03/2017 1052   CREATININE 0.70 09/06/2022 1856   CREATININE 0.92 (H) 09/16/2020 1447   CREATININE 1.1 01/03/2017 1052   CALCIUM 8.8 (L) 09/06/2022 1853   CALCIUM 10.7 (H) 01/03/2017 1052   PROT 5.8 (L) 09/06/2022 1853   PROT 7.4 01/03/2017 1052   ALBUMIN 3.4 (L) 09/06/2022 1853   ALBUMIN 4.2 01/03/2017 1052   AST 30 09/06/2022 1853   AST 16 03/26/2018 1442   AST 20 01/03/2017 1052   ALT 20 09/06/2022 1853   ALT 10 03/26/2018 1442   ALT 17 01/03/2017 1052   ALKPHOS 45 09/06/2022 1853   ALKPHOS 60 01/03/2017 1052   BILITOT 0.7 09/06/2022 1853   BILITOT 0.2 (L) 03/26/2018 1442   BILITOT 0.47 01/03/2017 1052   GFRNONAA >60 09/06/2022 1853   GFRNONAA >60 03/26/2018 1442   GFRAA >60 03/26/2018 1442   Lab Results  Component Value Date   CHOL 154 08/02/2021   HDL 47.80 08/02/2021   LDLDIRECT 74.0 08/02/2021   TRIG 325.0 (H) 08/02/2021   CHOLHDL 3 08/02/2021  Lab Results  Component Value Date   HGBA1C 5.8 (A) 03/05/2022   Lab Results  Component Value Date   VITAMINB12 446 08/21/2022   Lab Results  Component Value Date   TSH 1.38 08/02/2021    MRI Brain 09/13/2022 1. No acute intracranial abnormality. 2. 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 No emergent large vessel occlusion or hemodynamically significant stenosis of the head or neck. 2. Mild bilateral carotid bifurcation atherosclerosis without hemodynamically significant stenosis. 3. Incidental left thyroid nodule measuring 2.2 cm. Recommend non-emergent thyroid ultrasound.  ASSESSMENT AND PLAN  82 y.o. year old female with multiple medical condition including iron deficiency anemia and anemia, hypertension, hyperlipidemia, hypothyroidism who is presenting with confusion  and memory deficit.  Husband reported that patient memory is greatly affected, she she asked the same question over and over and she relies on her husband for some of the activity of daily living.  On exam today she was noted to have a 12 out of 30 on the MoCA indicative of impairment.  Her recent labs include a normal TSH and normal B12.  Plan for now is to obtain ATN panel to look for Alzheimer disease biomarker.  I will contact the patient to go over the result if positive will likely initiate medication.  Advised them to continue follow-up with PCP and to return in 1 year or sooner if worse.   1. Mild cognitive impairment      Patient Instructions  ATN profile for evaluation of Alzheimer disease biomarker Continue current medications Follow-up with PCP Return in 1 year or sooner if worse.   There are well-accepted and sensible ways to reduce risk for Alzheimers disease and other degenerative brain disorders .  Exercise Daily Walk A daily 20 minute walk should be part of your routine. Disease related apathy can be a significant roadblock to exercise and the only way to overcome this is to make it a daily routine and perhaps have a reward at the end (something your loved one loves to eat or drink perhaps) or a personal trainer coming to the home can also be very useful. Most importantly, the patient is much more likely to exercise if the caregiver / spouse does it with him/her. In general a structured, repetitive schedule is best.  General Health: Any diseases which effect your body will effect your brain such as a pneumonia, urinary infection, blood clot, heart attack or stroke. Keep contact with your primary care doctor for regular follow ups.  Sleep. A good nights sleep is healthy for the brain. Seven hours is recommended. If you have insomnia or poor sleep habits we can give you some instructions. If you have sleep apnea wear your mask.  Diet: Eating a heart healthy diet is also a good  idea; fish and poultry instead of red meat, nuts (mostly non-peanuts), vegetables, fruits, olive oil or canola oil (instead of butter), minimal salt (use other spices to flavor foods), whole grain rice, bread, cereal and pasta and wine in moderation.Research is now showing that the MIND diet, which is a combination of The Mediterranean diet and the DASH diet, is beneficial for cognitive processing and longevity. Information about this diet can be found in The MIND Diet, a book by Alonna Minium, MS, RDN, and online at [employer]  Finances, Power of [address] and Advance Directives: You should consider putting legal safeguards in place with regard to financial and medical  decision making. While the spouse always has power of attorney for medical and financial issues in the absence of any form, you should consider what you want in case the spouse / caregiver is no longer around or capable of making decisions.   Orders Placed This Encounter  Procedures   ATN PROFILE    No orders of the defined types were placed in this encounter.   Return if symptoms worsen or fail to improve.    Windell Norfolk, MD 09/13/2022, 6:27 PM  [facility] [address] [place], [place] [address] [phone]

## 2022-09-14 ENCOUNTER — Telehealth: Payer: Self-pay | Admitting: *Deleted

## 2022-09-14 ENCOUNTER — Other Ambulatory Visit: Payer: Self-pay | Admitting: *Deleted

## 2022-09-14 DIAGNOSIS — D509 Iron deficiency anemia, unspecified: Secondary | ICD-10-CM

## 2022-09-14 LAB — ATN PROFILE

## 2022-09-14 NOTE — Telephone Encounter (Signed)
-----   Message from Beverley Fiedler, MD sent at 09/14/2022  4:21 PM EDT ----- Regarding: RE: CBC Repeat CBC in 4 weeks ----- Message ----- From: Avanell Shackleton, RN Sent: 09/11/2022   9:06 AM EDT To: Beverley Fiedler, MD Subject: FW: CBC                                        Paient had a CBC done on 09/06/22 while in the hospital. Do you want to have a repeat CBC? ----- Message ----- From: Chrystie Nose, RN Sent: 09/11/2022   8:33 AM EDT To: Avanell Shackleton, RN Subject: FW: CBC                                         ----- Message ----- From: Chrystie Nose, RN Sent: 09/07/2022  12:00 AM EDT To: Chrystie Nose, RN Subject: CBC                                            Pt needs CBC in 2 weeks

## 2022-09-14 NOTE — Telephone Encounter (Signed)
Patient notified of need for lab draw in 4 weeks. Order placed for CBC. Hours and location given to patient.

## 2022-09-19 ENCOUNTER — Other Ambulatory Visit: Payer: Medicare Other

## 2022-09-19 LAB — ATN PROFILE

## 2022-09-20 ENCOUNTER — Inpatient Hospital Stay: Admission: RE | Admit: 2022-09-20 | Payer: Medicare Other | Source: Ambulatory Visit

## 2022-09-20 ENCOUNTER — Telehealth: Payer: Self-pay

## 2022-09-20 ENCOUNTER — Encounter: Payer: Self-pay | Admitting: Family Medicine

## 2022-09-20 LAB — ATN PROFILE
Beta-amyloid 42: 18.31 pg/mL
N -- NfL, Plasma: 3.85 pg/mL (ref 0.00–11.55)

## 2022-09-20 MED ORDER — DONEPEZIL HCL 5 MG PO TABS: 5.0000 mg | ORAL_TABLET | Freq: Every day | ORAL | 6 refills | Status: DC

## 2022-09-20 NOTE — Progress Notes (Signed)
Please call and advise the patient husband Maisie Fus that the recent labs we checked showed evidence of Alzheimer disease biomarker. Please inform Maisie Fus that I will start patient on Aricept for her dementia. Side effect include diarrhea, vivid dreams and dizziness. Please remind them to keep any upcoming appointments or tests and to call us with any interim questions, concerns, problems or updates. Thanks,   Windell Norfolk, MD

## 2022-09-20 NOTE — Telephone Encounter (Signed)
Call to give result for ATN profile, no answer, left message to return call to office

## 2022-09-20 NOTE — Telephone Encounter (Signed)
Call from husband, review results, medication prescribed and side effects. Husband verbalized understanding. No questions.

## 2022-09-20 NOTE — Addendum Note (Signed)
Addended byWindell Norfolk on: 09/20/2022 08:00 AM   Modules accepted: Orders

## 2022-09-25 ENCOUNTER — Inpatient Hospital Stay: Payer: Medicare Other

## 2022-09-25 ENCOUNTER — Other Ambulatory Visit: Payer: Self-pay

## 2022-09-25 ENCOUNTER — Inpatient Hospital Stay: Payer: Medicare Other | Attending: Hematology and Oncology | Admitting: Hematology and Oncology

## 2022-09-25 VITALS — BP 159/56 | HR 68 | Temp 97.9°F | Resp 18 | Ht 60.0 in | Wt 146.8 lb

## 2022-09-25 DIAGNOSIS — Z79899 Other long term (current) drug therapy: Secondary | ICD-10-CM | POA: Insufficient documentation

## 2022-09-25 DIAGNOSIS — Z9221 Personal history of antineoplastic chemotherapy: Secondary | ICD-10-CM | POA: Diagnosis not present

## 2022-09-25 DIAGNOSIS — Z800 Family history of malignant neoplasm of digestive organs: Secondary | ICD-10-CM | POA: Diagnosis not present

## 2022-09-25 DIAGNOSIS — Z923 Personal history of irradiation: Secondary | ICD-10-CM | POA: Insufficient documentation

## 2022-09-25 DIAGNOSIS — Z853 Personal history of malignant neoplasm of breast: Secondary | ICD-10-CM | POA: Insufficient documentation

## 2022-09-25 DIAGNOSIS — D509 Iron deficiency anemia, unspecified: Secondary | ICD-10-CM | POA: Diagnosis not present

## 2022-09-25 DIAGNOSIS — Z803 Family history of malignant neoplasm of breast: Secondary | ICD-10-CM | POA: Diagnosis not present

## 2022-09-25 LAB — CBC WITH DIFFERENTIAL/PLATELET
Abs Immature Granulocytes: 0.01 10*3/uL (ref 0.00–0.07)
Basophils Absolute: 0.1 10*3/uL (ref 0.0–0.1)
Basophils Relative: 1 %
Eosinophils Absolute: 0.4 10*3/uL (ref 0.0–0.5)
Eosinophils Relative: 8 %
HCT: 35 % — ABNORMAL LOW (ref 36.0–46.0)
Hemoglobin: 11.1 g/dL — ABNORMAL LOW (ref 12.0–15.0)
Immature Granulocytes: 0 %
Lymphocytes Relative: 30 %
Lymphs Abs: 1.5 10*3/uL (ref 0.7–4.0)
MCH: 27.8 pg (ref 26.0–34.0)
MCHC: 31.7 g/dL (ref 30.0–36.0)
MCV: 87.7 fL (ref 80.0–100.0)
Monocytes Absolute: 0.7 10*3/uL (ref 0.1–1.0)
Monocytes Relative: 13 %
Neutro Abs: 2.3 10*3/uL (ref 1.7–7.7)
Neutrophils Relative %: 48 %
Platelets: 236 10*3/uL (ref 150–400)
RBC: 3.99 MIL/uL (ref 3.87–5.11)
RDW: 19.6 % — ABNORMAL HIGH (ref 11.5–15.5)
WBC: 4.9 10*3/uL (ref 4.0–10.5)
nRBC: 0 % (ref 0.0–0.2)

## 2022-09-25 LAB — FERRITIN: Ferritin: 57 ng/mL (ref 11–307)

## 2022-09-25 LAB — IRON AND IRON BINDING CAPACITY (CC-WL,HP ONLY)
Iron: 43 ug/dL (ref 28–170)
Saturation Ratios: 12 % (ref 10.4–31.8)
TIBC: 371 ug/dL (ref 250–450)
UIBC: 328 ug/dL (ref 148–442)

## 2022-09-25 NOTE — Progress Notes (Signed)
[name] CONSULT NOTE  Patient Care Team: Kristian Covey, MD as PCP - General (Family Medicine) [name], Valentino Hue, MD (Inactive) as Consulting Physician (Oncology) Avel Peace, MD as Consulting Physician (General Surgery) Lonie Peak, MD as Attending Physician (Radiation Oncology) Sharrell Ku, MD as Consulting Physician (Gastroenterology) Janalyn Harder, MD (Inactive) as Consulting Physician (Dermatology) Axel Filler, Larna Daughters, NP as Nurse Practitioner (Hematology and Oncology) Verner Chol, [facility] (Inactive) as Pharmacist (Pharmacist) Juanell Fairly, RN as Case Manager  CHIEF COMPLAINTS/PURPOSE OF CONSULTATION:  IDA  ASSESSMENT & PLAN:   This is a very pleasant 82 year old female patient with past medical history significant for iron deficiency anemia, history of breast cancer, memory loss recently admitted with severe anemia thought to have a nonbleeding large gastric ulcer status post multiple units of blood transfusion and iron infusion and thought to have had an outpatient reaction to iron infusion when she was trying to complete her iron outpatient after hospital discharge sent to hematology for additional recommendations.   Patient denies any complaints at all.  She seems to be confused as to why she is here when she is feeling so good.  She has stopped taking Excedrin.  No concerns on physical exam.  Last labs with significant improvement in hemoglobin.  Since she has hemoglobin over 11 g/dL with iron deficiency, I recommended that she is take oral iron 65/325 every other day and return to clinic in 8 weeks or sooner as needed.  She seems to be very agreeable to these recommendations.  We have ordered repeat CBC, iron panel and ferritin today.  Thank you for consulting [place] in the care of this patient.  Please do not hesitate to contact us with any additional questions or concerns. HISTORY OF PRESENTING ILLNESS:  [name] 82 y.o. female  is here because of IDA  This is a pleasant 82 year old female patient with multiple medical comorbidities who was recently admitted with severe anemia, thought to have a large nonbleeding gastric ulcer, required blood transfusion and iron infusion.  She was thought to have a reaction during outpatient iron infusion hence went to the ER again but discharged since she was recovering as expected.  She today arrived to the appointment with her husband.  She kept saying I cannot remember to most things from history standpoint.  She tells me however is that she feels good, she is active, moving around in the house okay.  She has no complaints whatsoever.  She denies any blood loss in her stool or black stool.  She has stopped taking Excedrin.  She has not been taking any iron supplementation but she is willing to.  No change in bowel/urinary habits or neurological complaints.  Rest of the pertinent 10 point ROS reviewed and negative  MEDICAL HISTORY:  Past Medical History:  Diagnosis Date   Anemia    Anxiety    Arthritis    knees   Atrophic vaginitis    Cancer (HCC)    breast cancer - left   Diverticulosis    Endometriosis    Family history of breast cancer    Family history of colon cancer    GERD (gastroesophageal reflux disease)    Headache(784.0)    Heart murmur    as a child only   History of kidney stones    passed stones, no surgery required   History of radiation therapy 01/30/16- 03/02/16   Left Breast 50 Gy in 25 fractions.    Hyperlipidemia  Hypertension    Hypothyroidism    Internal hemorrhoids    Osteopenia    Osteoporosis    Personal history of chemotherapy    Personal history of radiation therapy    Rheumatic fever    age 22   Sleep apnea    " very mild" does not wear CPAP   Thyroid disease    Hypothyroid   Ulcer    UTI (lower urinary tract infection)     SURGICAL HISTORY: Past Surgical History:  Procedure Laterality Date   APPENDECTOMY     BIOPSY   08/23/2022   Procedure: BIOPSY;  Surgeon: Beverley Fiedler, MD;  Location: [facility] ENDOSCOPY;  Service: Gastroenterology;;   BREAST BIOPSY Left 08/19/2015   BREAST LUMPECTOMY Left 09/08/2015   BREAST LUMPECTOMY WITH RADIOACTIVE SEED AND SENTINEL LYMPH NODE BIOPSY Left 09/09/2015   Procedure: LEFT BREAST LUMPECTOMY WITH RADIOACTIVE SEED AND SENTINEL LYMPH NODE BIOPSY;  Surgeon: Avel Peace, MD;  Location: [facility] OR;  Service: General;  Laterality: Left;   COLONOSCOPY     COLONOSCOPY N/A 08/23/2022   Procedure: COLONOSCOPY;  Surgeon: Beverley Fiedler, MD;  Location: [facility] ENDOSCOPY;  Service: Gastroenterology;  Laterality: N/A;   DILATATION & CURETTAGE/HYSTEROSCOPY WITH MYOSURE N/A 10/09/2016   Procedure: DILATATION & CURETTAGE/HYSTEROSCOPY WITH MYOSURE;  Surgeon: Genia Del, MD;  Location: [facility] ORS;  Service: Gynecology;  Laterality: N/A;  requesting 7:30am in our block  requests one hour   DILATATION & CURETTAGE/HYSTEROSCOPY WITH MYOSURE N/A 03/19/2022   Procedure: DILATATION & CURETTAGE/HYSTEROSCOPY;  Surgeon: Romualdo Bolk, MD;  Location: [facility] [place];  Service: Gynecology;  Laterality: N/A;   ESOPHAGOGASTRODUODENOSCOPY (EGD) WITH PROPOFOL N/A 08/23/2022   Procedure: ESOPHAGOGASTRODUODENOSCOPY (EGD) WITH PROPOFOL;  Surgeon: Beverley Fiedler, MD;  Location: [facility] ENDOSCOPY;  Service: Gastroenterology;  Laterality: N/A;   ESOPHAGOGASTRODUODENOSCOPY ENDOSCOPY     IR GENERIC HISTORICAL  12/05/2015   IR CV LINE INJECTION 12/05/2015 Jolaine Click, MD [facility]-INTERV RAD   LAPAROSCOPIC ENDOMETRIOSIS FULGURATION  1978   PELVIC LAPAROSCOPY     PORTACATH PLACEMENT N/A 10/04/2015   Procedure: INSERTION PORT-A-CATH WITH ULTRASOUND;  Surgeon: Avel Peace, MD;  Location: [facility] OR;  Service: General;  Laterality: N/A;   portacath removal     TONSILLECTOMY     ULNAR NERVE REPAIR  2010    SOCIAL HISTORY: Social History   Socioeconomic History   Marital status: Married    Spouse name: Not on file   Number of  children: 0   Years of education: Not on file   Highest education level: Not on file  Occupational History   Occupation: retired  Tobacco Use   Smoking status: Never   Smokeless tobacco: Never  Vaping Use   Vaping Use: Never used  Substance and Sexual Activity   Alcohol use: No   Drug use: No   Sexual activity: Not Currently    Birth control/protection: Post-menopausal  Other Topics Concern   Not on file  Social History Narrative   Retired Midwife.    Social Determinants of Health   Financial Resource Strain: Low Risk  (05/22/2022)   Overall Financial Resource Strain (CARDIA)    Difficulty of Paying Living Expenses: Not very hard  Food Insecurity: No Food Insecurity (08/27/2022)   Hunger Vital Sign    Worried About Running Out of Food in the Last Year: Never true    Ran Out of Food in the Last Year: Never true  Transportation Needs: No Transportation Needs (08/27/2022)   PRAPARE - Transportation  Lack of Transportation (Medical): No    Lack of Transportation (Non-Medical): No  Physical Activity: Insufficiently Active (03/17/2021)   Exercise Vital Sign    Days of Exercise per Week: 3 days    Minutes of Exercise per Session: 10 min  Stress: No Stress Concern Present (05/22/2022)   [employer] of Occupational Health - Occupational Stress Questionnaire    Feeling of Stress : Not at all  Social Connections: Socially Integrated (05/22/2022)   Social Connection and Isolation Panel [NHANES]    Frequency of Communication with Friends and Family: More than three times a week    Frequency of Social Gatherings with Friends and Family: More than three times a week    Attends Religious Services: More than 4 times per year    Active Member of [employer] or Organizations: Yes    Attends Engineer, structural: More than 4 times per year    Marital Status: Married  Catering manager Violence: Not At Risk (03/17/2021)   Humiliation, Afraid, Rape, and Kick questionnaire     Fear of Current or Ex-Partner: No    Emotionally Abused: No    Physically Abused: No    Sexually Abused: No    FAMILY HISTORY: Family History  Problem Relation Age of Onset   Breast cancer Mother        Age 60   Hypertension Mother    Heart disease Father    Stroke Father    Colon cancer Father        dx 56s   Breast cancer Paternal Grandmother        Age 47   Diabetes Paternal Grandmother    Heart disease Maternal Grandmother     ALLERGIES:  is allergic to ferrlecit [na ferric gluc cplx in sucrose], nitrofurantoin, and sulfamethoxazole-trimethoprim.  MEDICATIONS:  Current Outpatient Medications  Medication Sig Dispense Refill   amLODipine (NORVASC) 5 MG tablet TAKE ONE TABLET BY MOUTH EVERY MORNING 90 tablet 3   atorvastatin (LIPITOR) 20 MG tablet Take 1 tablet (20 mg total) by mouth daily. (Patient taking differently: Take 20 mg by mouth at bedtime.) 90 tablet 3   donepezil (ARICEPT) 5 MG tablet Take 1 tablet (5 mg total) by mouth at bedtime. 30 tablet 6   DRAMAMINE 50 MG tablet Take 150 mg by mouth at bedtime as needed (for sleep).     esomeprazole (NEXIUM) 40 MG capsule TAKE ONE CAPSULE BY MOUTH ONCE DAILY (Patient taking differently: Take 40 mg by mouth daily before breakfast.) 90 capsule 1   famotidine (PEPCID) 40 MG tablet Take 40 mg by mouth daily before supper.     furosemide (LASIX) 20 MG tablet TAKE ONE TABLET BY MOUTH EVERY MORNING 90 tablet 3   levothyroxine (SYNTHROID) 50 MCG tablet TAKE ONE TABLET BY MOUTH BEFORE BREAKFAST (Patient taking differently: Take 50 mcg by mouth daily before breakfast.) 90 tablet 3   losartan-hydrochlorothiazide (HYZAAR) 100-12.5 MG tablet TAKE ONE TABLET BY MOUTH EVERY MORNING 90 tablet 3   Melatonin 3 MG TBDP Take 3 mg by mouth at bedtime.     metoprolol tartrate (LOPRESSOR) 25 MG tablet TAKE ONE TABLET BY MOUTH EVERY MORNING and TAKE ONE TABLET BY MOUTH EVERYDAY AT BEDTIME (Patient taking differently: Take 25 mg by mouth in the morning  and at bedtime.) 180 tablet 3   Multiple Vitamins-Minerals (WOMENS MULTIVITAMIN) TABS Take 1 tablet by mouth at bedtime.     pantoprazole (PROTONIX) 40 MG tablet Take 1 tablet (40 mg total) by mouth 2 (  two) times daily before a meal. 60 tablet 2   potassium chloride (KLOR-CON 10) 10 MEQ tablet Take 1 tablet (10 mEq total) by mouth 2 (two) times daily. 60 tablet 5   tamoxifen (NOLVADEX) 20 MG tablet Take 1 tablet (20 mg total) by mouth daily. 30 tablet 12   TUMS 500 MG chewable tablet Chew 1-2 tablets by mouth daily as needed for indigestion or heartburn.     TYLENOL 325 MG tablet Take 325-650 mg by mouth every 6 (six) hours as needed for mild pain or headache.     Current Facility-Administered Medications  Medication Dose Route Frequency Provider Last Rate Last Admin   betamethasone acetate-betamethasone sodium phosphate (CELESTONE) injection 12 mg  12 mg Other Once Tyrell Antonio, MD         PHYSICAL EXAMINATION: ECOG PERFORMANCE STATUS: 0 - Asymptomatic  Vitals:   09/25/22 1132  BP: (!) 159/56  Pulse: 68  Resp: 18  Temp: 97.9 F (36.6 C)  SpO2: 97%   Filed Weights   09/25/22 1132  Weight: 146 lb 12.8 oz (66.6 kg)    GENERAL: She appears alert but has some memory issues.  No overt confusion but she says she cannot remember most things.   SKIN: skin color, texture, turgor are normal, no rashes or significant lesions NECK: supple, thyroid normal size, non-tender, without nodularity LYMPH:  no palpable lymphadenopathy in the cervical, axillary  LUNGS: clear to auscultation and percussion with normal breathing effort ABDOMEN:abdomen soft, non-tender and normal bowel sounds Musculoskeletal:no cyanosis of digits and no clubbing  PSYCH: alert & oriented x 3 with fluent speech NEURO: no focal motor/sensory deficits  LABORATORY DATA:  I have reviewed the data as listed Lab Results  Component Value Date   WBC 6.4 09/06/2022   HGB 11.6 (L) 09/06/2022   HCT 34.0 (L) 09/06/2022    MCV 91.9 09/06/2022   PLT 386 09/06/2022     Chemistry      Component Value Date/Time   NA 142 09/06/2022 1856   NA 140 01/03/2017 1052   K 3.8 09/06/2022 1856   K 4.5 01/03/2017 1052   CL 110 09/06/2022 1856   CO2 21 (L) 09/06/2022 1853   CO2 27 01/03/2017 1052   BUN 10 09/06/2022 1856   BUN 25.5 01/03/2017 1052   CREATININE 0.70 09/06/2022 1856   CREATININE 0.92 (H) 09/16/2020 1447   CREATININE 1.1 01/03/2017 1052      Component Value Date/Time   CALCIUM 8.8 (L) 09/06/2022 1853   CALCIUM 10.7 (H) 01/03/2017 1052   ALKPHOS 45 09/06/2022 1853   ALKPHOS 60 01/03/2017 1052   AST 30 09/06/2022 1853   AST 16 03/26/2018 1442   AST 20 01/03/2017 1052   ALT 20 09/06/2022 1853   ALT 10 03/26/2018 1442   ALT 17 01/03/2017 1052   BILITOT 0.7 09/06/2022 1853   BILITOT 0.2 (L) 03/26/2018 1442   BILITOT 0.47 01/03/2017 1052       RADIOGRAPHIC STUDIES: I have personally reviewed the radiological images as listed and agreed with the findings in the report. MR BRAIN WO CONTRAST  Result Date: 09/06/2022 CLINICAL DATA:  Acute neurologic deficit EXAM: MRI HEAD WITHOUT CONTRAST TECHNIQUE: Multiplanar, multiecho pulse sequences of the brain and surrounding structures were obtained without intravenous contrast. COMPARISON:  None Available. FINDINGS: Brain: No acute infarct, mass effect or extra-axial collection. No acute or chronic hemorrhage. There is multifocal hyperintense T2-weighted signal within the white matter. Generalized volume loss. 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 The midline structures are normal. Vascular: Major flow voids are preserved. Skull and upper cervical spine: Normal calvarium and skull base. Visualized upper cervical spine and soft tissues are normal. Sinuses/Orbits:No paranasal sinus fluid levels or advanced mucosal thickening. No mastoid or middle ear effusion. Normal orbits. IMPRESSION: 1. No acute intracranial abnormality. 2. 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 Electronically Signed   By: Deatra Robinson M.D.   On: 09/06/2022 23:09   CT ANGIO HEAD NECK W WO CM (CODE STROKE)  Result Date: 09/06/2022 CLINICAL DATA:  Bilateral hand tingling EXAM: CT ANGIOGRAPHY HEAD AND NECK WITH AND WITHOUT CONTRAST TECHNIQUE: Multidetector CT imaging of the head and neck was performed using the standard protocol during bolus administration of intravenous contrast. Multiplanar CT image reconstructions and MIPs were obtained to evaluate the vascular anatomy. Carotid stenosis measurements (when applicable) are obtained utilizing NASCET criteria, using the distal internal carotid diameter as the denominator. RADIATION DOSE REDUCTION: This exam was performed according to the departmental dose-optimization program which includes automated exposure control, adjustment of the mA and/or kV according to patient size and/or use of iterative reconstruction technique. CONTRAST:  75mL OMNIPAQUE IOHEXOL 350 MG/ML SOLN COMPARISON:  None Available. FINDINGS: CT HEAD FINDINGS Brain: There is no mass, hemorrhage or extra-axial collection. The size and configuration of the ventricles and extra-axial CSF spaces are normal. There is no acute or chronic infarction. The brain parenchyma is normal. Skull: The visualized skull base, calvarium and extracranial soft tissues are normal. Sinuses/Orbits: No fluid levels or advanced mucosal thickening of the visualized paranasal sinuses. No mastoid or middle ear effusion. The orbits are normal. CTA NECK FINDINGS SKELETON: There is no bony spinal canal stenosis. No lytic or blastic lesion. OTHER NECK: Normal pharynx, larynx and major salivary glands. No cervical lymphadenopathy. 2.2 cm hypodense nodule of the left thyroid. UPPER CHEST: No pneumothorax or pleural effusion. No nodules or masses. AORTIC ARCH: There is no calcific atherosclerosis of the aortic arch. There is no aneurysm, dissection or hemodynamically  significant stenosis of the visualized portion of the aorta. Conventional 3 vessel aortic branching pattern. The visualized proximal subclavian arteries are widely patent. RIGHT CAROTID SYSTEM: No dissection, occlusion or aneurysm. Mild atherosclerotic calcification at the carotid bifurcation without hemodynamically significant stenosis. LEFT CAROTID SYSTEM: No dissection, occlusion or aneurysm. Mild atherosclerotic calcification at the carotid bifurcation without hemodynamically significant stenosis. VERTEBRAL ARTERIES: Right dominant configuration. Both origins are clearly patent. There is no dissection, occlusion or flow-limiting stenosis to the skull base (V1-V3 segments). CTA HEAD FINDINGS POSTERIOR CIRCULATION: --Vertebral arteries: Normal V4 segments. --Inferior cerebellar arteries: Normal. --Basilar artery: Normal. --Superior cerebellar arteries: Normal. --Posterior cerebral arteries (PCA): Normal. ANTERIOR CIRCULATION: --Intracranial internal carotid arteries: Normal. --Anterior cerebral arteries (ACA): Normal. Both A1 segments are present. Patent anterior communicating artery (a-comm). --Middle cerebral arteries (MCA): Normal. VENOUS SINUSES: As permitted by contrast timing, patent. ANATOMIC VARIANTS: None Review of the MIP images confirms the above findings. IMPRESSION: 1. No emergent large vessel occlusion or hemodynamically significant stenosis of the head or neck. 2. Mild bilateral carotid bifurcation atherosclerosis without hemodynamically significant stenosis. 3. Incidental left thyroid nodule measuring 2.2 cm. Recommend non-emergent thyroid ultrasound. Reference: J Am Coll Radiol. 2015 Feb;12(2): 143-50 Electronically Signed   By: Deatra Robinson M.D.   On: 09/06/2022 20:00   CT HEAD CODE STROKE WO CONTRAST  Result Date: 09/06/2022 CLINICAL DATA:  Code stroke.  Neuro deficit, acute, stroke suspected EXAM: CT HEAD WITHOUT CONTRAST TECHNIQUE: Contiguous  axial images were obtained from the base of  the skull through the vertex without intravenous contrast. RADIATION DOSE REDUCTION: This exam was performed according to the departmental dose-optimization program which includes automated exposure control, adjustment of the mA and/or kV according to patient size and/or use of iterative reconstruction technique. COMPARISON:  None Available. FINDINGS: Brain: No evidence of acute large vascular territory infarct, acute hemorrhage, mass lesion, midline shift or hydrocephalus. Vascular: No hyperdense vessel identified Skull: No acute fracture. Sinuses/Orbits: Clear sinuses. No acute orbital findings. Motion limited. Other: No mastoid effusions. ASPECTS [facility] Stroke Program Early CT Score) total score (0-10 with 10 being normal): 10. IMPRESSION: 1. No evidence of acute large vascular territory infarct or acute hemorrhage. 2. ASPECTS is 10. Code stroke imaging results were communicated on 09/06/2022 at 6:40 pm to provider Dr. Adela Lank via telephone, who verbally acknowledged these results. Electronically Signed   By: Feliberto Harts M.D.   On: 09/06/2022 18:41    All questions were answered. The patient knows to call the clinic with any problems, questions or concerns. I spent 45 minutes in the care of this patient including H and P, review of records, counseling and coordination of care.     Rachel Moulds, MD 09/25/2022 11:53 AM

## 2022-09-28 ENCOUNTER — Ambulatory Visit: Payer: Medicare Other | Admitting: Physician Assistant

## 2022-09-28 ENCOUNTER — Encounter: Payer: Self-pay | Admitting: Physician Assistant

## 2022-09-28 VITALS — BP 120/68 | HR 94 | Ht 60.0 in | Wt 149.6 lb

## 2022-09-28 DIAGNOSIS — D509 Iron deficiency anemia, unspecified: Secondary | ICD-10-CM | POA: Diagnosis not present

## 2022-09-28 DIAGNOSIS — K250 Acute gastric ulcer with hemorrhage: Secondary | ICD-10-CM | POA: Diagnosis not present

## 2022-09-28 NOTE — Progress Notes (Signed)
Chief Complaint: IDA, history of large gastric ulcer  HPI:    [name] is an 83 year old female with a past medical history as listed below, known to Dr. Rhea Belton who presents to clinic today for follow-up after being seen in the hospital for iron deficiency anemia with finding of a large gastric ulcer.    08/23/2018 for EGD and colonoscopy during her hospitalization for iron deficiency anemia.  EGD with large nonbleeding gastric ulcer with pigmented material causing deformity near the pylorus, but not obstruction, could not advance into the second duodenum due to the ulcer.  At that time continued on IV ID PPI x 12 weeks.  Recommended repeat upper endoscopy in 2 months to check for healing.  Colonoscopy was normal.    09/25/2022 CBC with a hemoglobin of 11.1 (08/2309.6) ferritin and iron studies normal.    Today, patient tells me that she recently followed with hematology and all as well.  They placed her on oral iron supplement.  She continues on Pantoprazole 40 twice daily and has no new complaints.  She has also stopped Excedrin.  Denies any abdominal pain or reflux.  No GI complaints.    Denies fever, chills, weight loss, blood in her stool, nausea or vomiting.  Past Medical History:  Diagnosis Date   Anemia    Anxiety    Arthritis    knees   Atrophic vaginitis    Cancer (HCC)    breast cancer - left   Diverticulosis    Endometriosis    Family history of breast cancer    Family history of colon cancer    GERD (gastroesophageal reflux disease)    Headache(784.0)    Heart murmur    as a child only   History of kidney stones    passed stones, no surgery required   History of radiation therapy 01/30/16- 03/02/16   Left Breast 50 Gy in 25 fractions.    Hyperlipidemia    Hypertension    Hypothyroidism    Internal hemorrhoids    Osteopenia    Osteoporosis    Personal history of chemotherapy    Personal history of radiation therapy    Rheumatic fever    age 49   Sleep apnea     " very mild" does not wear CPAP   Thyroid disease    Hypothyroid   Ulcer    UTI (lower urinary tract infection)     Past Surgical History:  Procedure Laterality Date   APPENDECTOMY     BIOPSY  08/23/2022   Procedure: BIOPSY;  Surgeon: Beverley Fiedler, MD;  Location: [facility] ENDOSCOPY;  Service: Gastroenterology;;   BREAST BIOPSY Left 08/19/2015   BREAST LUMPECTOMY Left 09/08/2015   BREAST LUMPECTOMY WITH RADIOACTIVE SEED AND SENTINEL LYMPH NODE BIOPSY Left 09/09/2015   Procedure: LEFT BREAST LUMPECTOMY WITH RADIOACTIVE SEED AND SENTINEL LYMPH NODE BIOPSY;  Surgeon: Avel Peace, MD;  Location: [facility] OR;  Service: General;  Laterality: Left;   COLONOSCOPY     COLONOSCOPY N/A 08/23/2022   Procedure: COLONOSCOPY;  Surgeon: Beverley Fiedler, MD;  Location: [facility] ENDOSCOPY;  Service: Gastroenterology;  Laterality: N/A;   DILATATION & CURETTAGE/HYSTEROSCOPY WITH MYOSURE N/A 10/09/2016   Procedure: DILATATION & CURETTAGE/HYSTEROSCOPY WITH MYOSURE;  Surgeon: Genia Del, MD;  Location: [facility] ORS;  Service: Gynecology;  Laterality: N/A;  requesting 7:30am in our block  requests one hour   DILATATION & CURETTAGE/HYSTEROSCOPY WITH MYOSURE N/A 03/19/2022   Procedure: DILATATION & CURETTAGE/HYSTEROSCOPY;  Surgeon: Romualdo Bolk, MD;  Location: [facility];  Service: Gynecology;  Laterality: N/A;   ESOPHAGOGASTRODUODENOSCOPY (EGD) WITH PROPOFOL N/A 08/23/2022   Procedure: ESOPHAGOGASTRODUODENOSCOPY (EGD) WITH PROPOFOL;  Surgeon: Beverley Fiedler, MD;  Location: [facility] ENDOSCOPY;  Service: Gastroenterology;  Laterality: N/A;   ESOPHAGOGASTRODUODENOSCOPY ENDOSCOPY     IR GENERIC HISTORICAL  12/05/2015   IR CV LINE INJECTION 12/05/2015 Jolaine Click, MD [facility]-INTERV RAD   LAPAROSCOPIC ENDOMETRIOSIS FULGURATION  1978   PELVIC LAPAROSCOPY     PORTACATH PLACEMENT N/A 10/04/2015   Procedure: INSERTION PORT-A-CATH WITH ULTRASOUND;  Surgeon: Avel Peace, MD;  Location: [facility] OR;  Service: General;  Laterality:  N/A;   portacath removal     TONSILLECTOMY     ULNAR NERVE REPAIR  2010    Current Outpatient Medications  Medication Sig Dispense Refill   amLODipine (NORVASC) 5 MG tablet TAKE ONE TABLET BY MOUTH EVERY MORNING 90 tablet 3   atorvastatin (LIPITOR) 20 MG tablet Take 1 tablet (20 mg total) by mouth daily. (Patient taking differently: Take 20 mg by mouth at bedtime.) 90 tablet 3   donepezil (ARICEPT) 5 MG tablet Take 1 tablet (5 mg total) by mouth at bedtime. 30 tablet 6   DRAMAMINE 50 MG tablet Take 150 mg by mouth at bedtime as needed (for sleep).     esomeprazole (NEXIUM) 40 MG capsule TAKE ONE CAPSULE BY MOUTH ONCE DAILY (Patient taking differently: Take 40 mg by mouth daily before breakfast.) 90 capsule 1   famotidine (PEPCID) 40 MG tablet Take 40 mg by mouth daily before supper.     furosemide (LASIX) 20 MG tablet TAKE ONE TABLET BY MOUTH EVERY MORNING 90 tablet 3   levothyroxine (SYNTHROID) 50 MCG tablet TAKE ONE TABLET BY MOUTH BEFORE BREAKFAST (Patient taking differently: Take 50 mcg by mouth daily before breakfast.) 90 tablet 3   losartan-hydrochlorothiazide (HYZAAR) 100-12.5 MG tablet TAKE ONE TABLET BY MOUTH EVERY MORNING 90 tablet 3   Melatonin 3 MG TBDP Take 3 mg by mouth at bedtime.     metoprolol tartrate (LOPRESSOR) 25 MG tablet TAKE ONE TABLET BY MOUTH EVERY MORNING and TAKE ONE TABLET BY MOUTH EVERYDAY AT BEDTIME (Patient taking differently: Take 25 mg by mouth in the morning and at bedtime.) 180 tablet 3   Multiple Vitamins-Minerals (WOMENS MULTIVITAMIN) TABS Take 1 tablet by mouth at bedtime.     pantoprazole (PROTONIX) 40 MG tablet Take 1 tablet (40 mg total) by mouth 2 (two) times daily before a meal. 60 tablet 2   potassium chloride (KLOR-CON 10) 10 MEQ tablet Take 1 tablet (10 mEq total) by mouth 2 (two) times daily. 60 tablet 5   tamoxifen (NOLVADEX) 20 MG tablet Take 1 tablet (20 mg total) by mouth daily. 30 tablet 12   TUMS 500 MG chewable tablet Chew 1-2 tablets by  mouth daily as needed for indigestion or heartburn.     TYLENOL 325 MG tablet Take 325-650 mg by mouth every 6 (six) hours as needed for mild pain or headache.     Current Facility-Administered Medications  Medication Dose Route Frequency Provider Last Rate Last Admin   betamethasone acetate-betamethasone sodium phosphate (CELESTONE) injection 12 mg  12 mg Other Once Tyrell Antonio, MD        Allergies as of 09/28/2022 - Review Complete 09/28/2022  Allergen Reaction Noted   Ferrlecit [na ferric gluc cplx in sucrose] Shortness Of Breath and Other (See Comments) 09/06/2022   Nitrofurantoin Nausea And Vomiting and Other (See Comments)    Sulfamethoxazole-trimethoprim Nausea  And Vomiting and Other (See Comments)     Family History  Problem Relation Age of Onset   Breast cancer Mother        Age 27   Hypertension Mother    Heart disease Father    Stroke Father    Colon cancer Father        dx 48s   Breast cancer Paternal Grandmother        Age 56   Diabetes Paternal Grandmother    Heart disease Maternal Grandmother     Social History   Socioeconomic History   Marital status: Married    Spouse name: Not on file   Number of children: 0   Years of education: Not on file   Highest education level: Not on file  Occupational History   Occupation: retired  Tobacco Use   Smoking status: Never   Smokeless tobacco: Never  Vaping Use   Vaping Use: Never used  Substance and Sexual Activity   Alcohol use: No   Drug use: No   Sexual activity: Not Currently    Birth control/protection: Post-menopausal  Other Topics Concern   Not on file  Social History Narrative   Retired Midwife.    Social Determinants of Health   Financial Resource Strain: Low Risk  (05/22/2022)   Overall Financial Resource Strain (CARDIA)    Difficulty of Paying Living Expenses: Not very hard  Food Insecurity: No Food Insecurity (08/27/2022)   Hunger Vital Sign    Worried About Running Out of  Food in the Last Year: Never true    Ran Out of Food in the Last Year: Never true  Transportation Needs: No Transportation Needs (08/27/2022)   PRAPARE - Administrator, Civil Service (Medical): No    Lack of Transportation (Non-Medical): No  Physical Activity: Insufficiently Active (03/17/2021)   Exercise Vital Sign    Days of Exercise per Week: 3 days    Minutes of Exercise per Session: 10 min  Stress: No Stress Concern Present (05/22/2022)   [employer] of Occupational Health - Occupational Stress Questionnaire    Feeling of Stress : Not at all  Social Connections: Socially Integrated (05/22/2022)   Social Connection and Isolation Panel [NHANES]    Frequency of Communication with Friends and Family: More than three times a week    Frequency of Social Gatherings with Friends and Family: More than three times a week    Attends Religious Services: More than 4 times per year    Active Member of [employer] or Organizations: Yes    Attends Engineer, structural: More than 4 times per year    Marital Status: Married  Catering manager Violence: Not At Risk (03/17/2021)   Humiliation, Afraid, Rape, and Kick questionnaire    Fear of Current or Ex-Partner: No    Emotionally Abused: No    Physically Abused: No    Sexually Abused: No    Review of Systems:    Constitutional: No weight loss, fever or chills Cardiovascular: No chest pain Respiratory: No SOB  Gastrointestinal: See HPI and otherwise negative   Physical Exam:  Vital signs: BP 120/68   Pulse 94   Wt 149 lb 9.6 oz (67.9 kg)   SpO2 99%   BMI 29.22 kg/m    Constitutional:   Pleasant Elderly Caucasian female appears to be in NAD, Well developed, Well nourished, alert and cooperative Respiratory: Respirations even and unlabored. Lungs clear to auscultation bilaterally.   No wheezes, crackles,  or rhonchi.  Cardiovascular: Normal S1, S2. No MRG. Regular rate and rhythm. No peripheral edema, cyanosis or pallor.   Gastrointestinal:  Soft, nondistended, nontender. No rebound or guarding. Normal bowel sounds. No appreciable masses or hepatomegaly. Rectal:  Not performed.  Psychiatric: Demonstrates good judgement and reason without abnormal affect or behaviors.  RELEVANT LABS AND IMAGING: CBC    Component Value Date/Time   WBC 4.9 09/25/2022 1222   RBC 3.99 09/25/2022 1222   HGB 11.1 (L) 09/25/2022 1222   HGB 11.6 (L) 03/26/2018 1442   HGB 15.0 01/03/2017 1052   HCT 35.0 (L) 09/25/2022 1222   HCT 45.0 01/03/2017 1052   PLT 236 09/25/2022 1222   PLT 214 03/26/2018 1442   PLT 246 01/03/2017 1052   MCV 87.7 09/25/2022 1222   MCV 87.2 01/03/2017 1052   MCH 27.8 09/25/2022 1222   MCHC 31.7 09/25/2022 1222   RDW 19.6 (H) 09/25/2022 1222   RDW 13.8 01/03/2017 1052   LYMPHSABS 1.5 09/25/2022 1222   LYMPHSABS 1.3 01/03/2017 1052   MONOABS 0.7 09/25/2022 1222   MONOABS 0.7 01/03/2017 1052   EOSABS 0.4 09/25/2022 1222   EOSABS 0.3 01/03/2017 1052   BASOSABS 0.1 09/25/2022 1222   BASOSABS 0.1 01/03/2017 1052    CMP     Component Value Date/Time   NA 142 09/06/2022 1856   NA 140 01/03/2017 1052   K 3.8 09/06/2022 1856   K 4.5 01/03/2017 1052   CL 110 09/06/2022 1856   CO2 21 (L) 09/06/2022 1853   CO2 27 01/03/2017 1052   GLUCOSE 123 (H) 09/06/2022 1856   GLUCOSE 114 01/03/2017 1052   BUN 10 09/06/2022 1856   BUN 25.5 01/03/2017 1052   CREATININE 0.70 09/06/2022 1856   CREATININE 0.92 (H) 09/16/2020 1447   CREATININE 1.1 01/03/2017 1052   CALCIUM 8.8 (L) 09/06/2022 1853   CALCIUM 10.7 (H) 01/03/2017 1052   PROT 5.8 (L) 09/06/2022 1853   PROT 7.4 01/03/2017 1052   ALBUMIN 3.4 (L) 09/06/2022 1853   ALBUMIN 4.2 01/03/2017 1052   AST 30 09/06/2022 1853   AST 16 03/26/2018 1442   AST 20 01/03/2017 1052   ALT 20 09/06/2022 1853   ALT 10 03/26/2018 1442   ALT 17 01/03/2017 1052   ALKPHOS 45 09/06/2022 1853   ALKPHOS 60 01/03/2017 1052   BILITOT 0.7 09/06/2022 1853   BILITOT 0.2 (L)  03/26/2018 1442   BILITOT 0.47 01/03/2017 1052   GFRNONAA >60 09/06/2022 1853   GFRNONAA >60 03/26/2018 1442   GFRAA >60 03/26/2018 1442    Assessment: 1.  Iron deficiency anemia with history of large gastric ulcer: Recently seen in the hospital with EGD showing large gastric ulcer thought to be the source of iron deficiency anemia, has since followed with hematology with a hemoglobin that stable and placed on oral iron, still on her Pantoprazole 40 twice daily and doing well  Plan: 1.  Continue Pantoprazole 40 mg twice daily until next EGD. 2.  Repeat EGD scheduled with Dr. Rhea Belton in the [facility] 2 months out from last endoscopy.  Did provide the patient a detailed list of risks for the procedure and she agrees to proceed. Patient is appropriate for endoscopic procedure(s) in the ambulatory ([facility]) setting. 3.  Patient to continue following with hematology in regards to iron deficiency anemia 4.  Patient to follow in clinic per recommendations after procedure.  Hyacinth Meeker, PA-C [facility] 09/28/2022, 1:33 PM  Cc: Kristian Covey, MD

## 2022-09-28 NOTE — Patient Instructions (Addendum)
Continue your pantoprazole twice a day.  You have been scheduled for an endoscopy. Please follow written instructions given to you at your visit today. If you use inhalers (even only as needed), please bring them with you on the day of your procedure.   _______________________________________________________  If your blood pressure at your visit was 140/90 or greater, please contact your primary care physician to follow up on this.  _______________________________________________________  If you are age 25 or older, your body mass index should be between 23-30. Your Body mass index is 29.22 kg/m. If this is out of the aforementioned range listed, please consider follow up with your Primary Care Provider.  If you are age 21 or younger, your body mass index should be between 19-25. Your Body mass index is 29.22 kg/m. If this is out of the aformentioned range listed, please consider follow up with your Primary Care Provider.   ________________________________________________________  The [facility] providers would like to encourage you to use [facility] to communicate with providers for non-urgent requests or questions.  Due to long hold times on the telephone, sending your provider a message by [facility] may be a faster and more efficient way to get a response.  Please allow 48 business hours for a response.  Please remember that this is for non-urgent requests.  _______________________________________________________

## 2022-10-03 NOTE — Progress Notes (Signed)
Addendum: Reviewed and agree with assessment and management plan. Siddhanth Denk M, MD  

## 2022-10-04 ENCOUNTER — Other Ambulatory Visit: Payer: Medicare Other

## 2022-10-09 ENCOUNTER — Encounter: Payer: Self-pay | Admitting: Family Medicine

## 2022-10-09 ENCOUNTER — Ambulatory Visit (INDEPENDENT_AMBULATORY_CARE_PROVIDER_SITE_OTHER): Payer: Medicare Other | Admitting: Family Medicine

## 2022-10-09 VITALS — BP 130/68 | HR 78 | Temp 98.6°F | Wt 153.0 lb

## 2022-10-09 DIAGNOSIS — M25471 Effusion, right ankle: Secondary | ICD-10-CM

## 2022-10-09 DIAGNOSIS — N1831 Chronic kidney disease, stage 3a: Secondary | ICD-10-CM | POA: Diagnosis not present

## 2022-10-09 DIAGNOSIS — I5032 Chronic diastolic (congestive) heart failure: Secondary | ICD-10-CM

## 2022-10-09 DIAGNOSIS — M25472 Effusion, left ankle: Secondary | ICD-10-CM

## 2022-10-09 NOTE — Progress Notes (Signed)
   Subjective:    Patient ID: [name], female    DOB: [birth year], 82 y.o.   MRN: [id number]  HPI Here with her husband to follow up on ankle edema. She has been prescribed Lasix to take daily, but her husband thinks she often does not take her medications as prescribed. He has been putting her medications in a weekly pill box, but he has not been observing her taking them. She does have dementia and is forgetful. She denies any SOB. Her recent labs on 09-06-22 showed normal renal function with a creatinine of 0.85 and a GFR of >60.    Review of Systems  Constitutional: Negative.   Respiratory: Negative.    Cardiovascular:  Positive for leg swelling. Negative for chest pain and palpitations.       Objective:   Physical Exam Constitutional:      Appearance: Normal appearance. She is not ill-appearing.  Cardiovascular:     Rate and Rhythm: Normal rate and regular rhythm.     Pulses: Normal pulses.     Heart sounds: Normal heart sounds.  Pulmonary:     Effort: Pulmonary effort is normal.     Breath sounds: Normal breath sounds.  Musculoskeletal:     Comments: 2+ edema in both ankles  Neurological:     Mental Status: She is alert.           Assessment & Plan:  Ankle edema, it seems very likely that she has not been taking the Lasix as intended. I proposed that her husband assume complete control of her medications, that he hand them out to her at the appropriate times and that he watch her take them in front of him. Both of them agreed to try this method. She will follow up with Dr. Caryl Never.  Stephanie Crane, MD

## 2022-10-11 ENCOUNTER — Other Ambulatory Visit: Payer: Self-pay | Admitting: Hematology and Oncology

## 2022-10-15 ENCOUNTER — Emergency Department (HOSPITAL_COMMUNITY)
Admission: EM | Admit: 2022-10-15 | Discharge: 2022-10-16 | Disposition: A | Discharge status: Home / Self Care | Payer: Medicare Other | Attending: Emergency Medicine | Admitting: Emergency Medicine

## 2022-10-15 ENCOUNTER — Telehealth: Payer: Self-pay | Admitting: Family Medicine

## 2022-10-15 ENCOUNTER — Encounter (HOSPITAL_COMMUNITY): Payer: Self-pay | Admitting: Student

## 2022-10-15 DIAGNOSIS — G309 Alzheimer's disease, unspecified: Secondary | ICD-10-CM | POA: Insufficient documentation

## 2022-10-15 DIAGNOSIS — R110 Nausea: Secondary | ICD-10-CM | POA: Diagnosis not present

## 2022-10-15 DIAGNOSIS — R9089 Other abnormal findings on diagnostic imaging of central nervous system: Secondary | ICD-10-CM | POA: Diagnosis not present

## 2022-10-15 DIAGNOSIS — R42 Dizziness and giddiness: Secondary | ICD-10-CM | POA: Insufficient documentation

## 2022-10-15 DIAGNOSIS — I6782 Cerebral ischemia: Secondary | ICD-10-CM | POA: Diagnosis not present

## 2022-10-15 DIAGNOSIS — E039 Hypothyroidism, unspecified: Secondary | ICD-10-CM | POA: Insufficient documentation

## 2022-10-15 DIAGNOSIS — Z853 Personal history of malignant neoplasm of breast: Secondary | ICD-10-CM | POA: Diagnosis not present

## 2022-10-15 DIAGNOSIS — I10 Essential (primary) hypertension: Secondary | ICD-10-CM | POA: Insufficient documentation

## 2022-10-15 DIAGNOSIS — R262 Difficulty in walking, not elsewhere classified: Secondary | ICD-10-CM | POA: Diagnosis not present

## 2022-10-15 DIAGNOSIS — N289 Disorder of kidney and ureter, unspecified: Secondary | ICD-10-CM | POA: Insufficient documentation

## 2022-10-15 DIAGNOSIS — Z79899 Other long term (current) drug therapy: Secondary | ICD-10-CM | POA: Insufficient documentation

## 2022-10-15 DIAGNOSIS — R531 Weakness: Secondary | ICD-10-CM | POA: Diagnosis not present

## 2022-10-15 LAB — MAGNESIUM: Magnesium: 2.2 mg/dL (ref 1.7–2.4)

## 2022-10-15 LAB — BASIC METABOLIC PANEL
Anion gap: 14 (ref 5–15)
BUN: 23 mg/dL (ref 8–23)
CO2: 26 mmol/L (ref 22–32)
Calcium: 9.7 mg/dL (ref 8.9–10.3)
Chloride: 96 mmol/L — ABNORMAL LOW (ref 98–111)
Creatinine, Ser: 1.09 mg/dL — ABNORMAL HIGH (ref 0.44–1.00)
GFR, Estimated: 51 mL/min — ABNORMAL LOW (ref >60)
Glucose, Bld: 176 mg/dL — ABNORMAL HIGH (ref 70–99)
Potassium: 3.2 mmol/L — ABNORMAL LOW (ref 3.5–5.1)
Sodium: 136 mmol/L (ref 135–145)

## 2022-10-15 LAB — URINALYSIS, ROUTINE W REFLEX MICROSCOPIC
Bilirubin Urine: NEGATIVE
Glucose, UA: NEGATIVE mg/dL
Hgb urine dipstick: NEGATIVE
Ketones, ur: NEGATIVE mg/dL
Nitrite: NEGATIVE
Protein, ur: NEGATIVE mg/dL
Specific Gravity, Urine: 1.014 (ref 1.005–1.030)
Squamous Epithelial / HPF: >50 /HPF (ref 0–5)
pH: 5 (ref 5.0–8.0)

## 2022-10-15 LAB — TROPONIN I (HIGH SENSITIVITY): Troponin I (High Sensitivity): 7 ng/L (ref <18)

## 2022-10-15 LAB — TYPE AND SCREEN
Donor AG Type: NEGATIVE
Unit division: 0

## 2022-10-15 LAB — CBC
HCT: 45.1 % (ref 36.0–46.0)
Hemoglobin: 14.4 g/dL (ref 12.0–15.0)
MCH: 27 pg (ref 26.0–34.0)
MCHC: 31.9 g/dL (ref 30.0–36.0)
MCV: 84.6 fL (ref 80.0–100.0)
Platelets: 352 10*3/uL (ref 150–400)
RBC: 5.33 MIL/uL — ABNORMAL HIGH (ref 3.87–5.11)
RDW: 17.9 % — ABNORMAL HIGH (ref 11.5–15.5)
WBC: 9.6 10*3/uL (ref 4.0–10.5)
nRBC: 0 % (ref 0.0–0.2)

## 2022-10-15 LAB — CBG MONITORING, ED: Glucose-Capillary: 196 mg/dL — ABNORMAL HIGH (ref 70–99)

## 2022-10-15 MED ORDER — SODIUM CHLORIDE 0.9 % IV BOLUS
500.0000 mL | Freq: Once | INTRAVENOUS | Status: AC
  Administered 2022-10-15: 500 mL via INTRAVENOUS

## 2022-10-15 MED ORDER — POTASSIUM CHLORIDE CRYS ER 20 MEQ PO TBCR
40.0000 meq | EXTENDED_RELEASE_TABLET | Freq: Once | ORAL | Status: AC
  Administered 2022-10-15: 40 meq via ORAL
  Filled 2022-10-15: qty 2

## 2022-10-15 NOTE — Telephone Encounter (Signed)
[name] with triage nurse called in and stated that pt was recently dx with severe anemia, she is getting weaker, has no energy, has nausea, it not eating or drinking enough. She has had several blood transfusions recently as well. Per RN Selena Batten, was recommended that pt be seen in the next 3-4 hours. I let her know we did not have any open slots at our office. She stated she would recommend pt to go to ER.

## 2022-10-15 NOTE — ED Provider Notes (Incomplete)
[place] EMERGENCY DEPARTMENT AT [facility] Provider Note   CSN: [id number] Arrival date & time: 10/15/22  1625    History  Chief Complaint  Patient presents with   Weakness    [name] is a 82 y.o. female.  [name] is here today accompanied by her husband and son. She has been getting progressively weak for the last two days. She has a PMH significant for severe anemia likely secondary for a gastric ulcer most recently received a blood transfusion in May. Family is concerned that she will need another transfusion today. She is not in pain denies fever, chills, but endorses dizziness and difficulty walking.   The history is provided by the patient, a relative and the spouse.  Weakness Severity:  Moderate Onset quality:  Gradual Duration:  2 days Timing:  Constant Progression:  Worsening Chronicity:  New Associated symptoms: difficulty walking, dizziness and nausea   Associated symptoms: no abdominal pain, no cough, no diarrhea, no dysuria, no fever, no shortness of breath, no urgency and no vomiting   Risk factors: diabetes and new medications    Home Medications Prior to Admission medications   Medication Sig Start Date End Date Taking? Authorizing Provider  amLODipine (NORVASC) 5 MG tablet TAKE ONE TABLET BY MOUTH EVERY MORNING 03/12/22   Burchette, Elberta Fortis, MD  atorvastatin (LIPITOR) 20 MG tablet Take 1 tablet (20 mg total) by mouth daily. Patient taking differently: Take 20 mg by mouth at bedtime. 01/22/22   Burchette, Elberta Fortis, MD  donepezil (ARICEPT) 5 MG tablet Take 1 tablet (5 mg total) by mouth at bedtime. 09/20/22   Windell Norfolk, MD  DRAMAMINE 50 MG tablet Take 150 mg by mouth at bedtime as needed (for sleep).    [provider]  esomeprazole (NEXIUM) 40 MG capsule TAKE ONE CAPSULE BY MOUTH ONCE DAILY Patient taking differently: Take 40 mg by mouth daily before breakfast. 08/17/22   Burchette, Elberta Fortis, MD  famotidine (PEPCID) 40 MG  tablet Take 40 mg by mouth daily before supper.    [provider]  furosemide (LASIX) 20 MG tablet TAKE ONE TABLET BY MOUTH EVERY MORNING 03/12/22   Burchette, Elberta Fortis, MD  levothyroxine (SYNTHROID) 50 MCG tablet TAKE ONE TABLET BY MOUTH BEFORE BREAKFAST Patient taking differently: Take 50 mcg by mouth daily before breakfast. 03/12/22   Burchette, Elberta Fortis, MD  losartan-hydrochlorothiazide (HYZAAR) 100-12.5 MG tablet TAKE ONE TABLET BY MOUTH EVERY MORNING 03/12/22   Burchette, Elberta Fortis, MD  Melatonin 3 MG TBDP Take 3 mg by mouth at bedtime. 01/20/18   [provider]  metoprolol tartrate (LOPRESSOR) 25 MG tablet TAKE ONE TABLET BY MOUTH EVERY MORNING and TAKE ONE TABLET BY MOUTH EVERYDAY AT BEDTIME Patient taking differently: Take 25 mg by mouth in the morning and at bedtime. 03/12/22   Burchette, Elberta Fortis, MD  Multiple Vitamins-Minerals (WOMENS MULTIVITAMIN) TABS Take 1 tablet by mouth at bedtime.    [provider]  pantoprazole (PROTONIX) 40 MG tablet Take 1 tablet (40 mg total) by mouth 2 (two) times daily before a meal. 08/24/22 11/22/22  Hughie Closs, MD  potassium chloride (KLOR-CON 10) 10 MEQ tablet Take 1 tablet (10 mEq total) by mouth 2 (two) times daily. 09/03/22   Nelwyn Salisbury, MD  tamoxifen (NOLVADEX) 20 MG tablet TAKE ONE TABLET BY MOUTH EVERY MORNING 10/11/22   Rachel Moulds, MD  TUMS 500 MG chewable tablet Chew 1-2 tablets by mouth daily as needed for indigestion or  heartburn.    [provider]  TYLENOL 325 MG tablet Take 325-650 mg by mouth every 6 (six) hours as needed for mild pain or headache.    [provider]      Allergies    Ferrlecit [na ferric gluc cplx in sucrose], Nitrofurantoin, and Sulfamethoxazole-trimethoprim    Review of Systems   Review of Systems  Constitutional:  Positive for activity change and fatigue. Negative for chills and fever.  HENT:  Negative for congestion.   Eyes:  Negative for visual disturbance.   Respiratory:  Negative for cough and shortness of breath.   Gastrointestinal:  Positive for nausea. Negative for abdominal pain, diarrhea and vomiting.  Genitourinary:  Negative for dysuria, flank pain and urgency.  Musculoskeletal:  Positive for gait problem.  Neurological:  Positive for dizziness and weakness.    Physical Exam Updated Vital Signs BP (!) 120/44   Pulse 73   Temp 98.6 F (37 C) (Oral)   Resp 15   Ht 5' (1.524 m)   Wt 64.9 kg   SpO2 97%   BMI 27.93 kg/m  Physical Exam Cardiovascular:     Rate and Rhythm: Normal rate.     Pulses: Normal pulses.     Heart sounds: S1 normal and S2 normal. Heart sounds not distant. No murmur heard. Pulmonary:     Effort: No tachypnea or respiratory distress.     Breath sounds: Normal breath sounds.  Abdominal:     General: Bowel sounds are normal.     Palpations: Abdomen is soft.     Tenderness: There is abdominal tenderness in the suprapubic area. There is no guarding or rebound.  Musculoskeletal:     Right lower leg: No edema.     Left lower leg: No edema.  Neurological:     Mental Status: She is alert.     Cranial Nerves: No cranial nerve deficit or facial asymmetry.     Motor: No pronator drift.     Coordination: Finger-Nose-Finger Test and Heel to [employer] normal.     Comments: Alert to age and place. Does not know what month it is.  Inability to ambulate, can only sit up. "Cannot describe how she feels" but cannot take a couple steps.    ED Results / Procedures / Treatments   Labs (all labs ordered are listed, but only abnormal results are displayed) Labs Reviewed  BASIC METABOLIC PANEL - Abnormal; Notable for the following components:      Result Value   Potassium 3.2 (*)    Chloride 96 (*)    Glucose, Bld 176 (*)    Creatinine, Ser 1.09 (*)    GFR, Estimated 51 (*)    All other components within normal limits  CBC - Abnormal; Notable for the following components:   RBC 5.33 (*)    RDW 17.9 (*)    All  other components within normal limits  URINALYSIS, ROUTINE W REFLEX MICROSCOPIC - Abnormal; Notable for the following components:   Color, Urine AMBER (*)    APPearance CLOUDY (*)    Leukocytes,Ua MODERATE (*)    Bacteria, UA FEW (*)    Non Squamous Epithelial 0-5 (*)    All other components within normal limits  CBG MONITORING, ED - Abnormal; Notable for the following components:   Glucose-Capillary 196 (*)    All other components within normal limits  URINE CULTURE  MAGNESIUM  TYPE AND SCREEN  TROPONIN I (HIGH SENSITIVITY)  TROPONIN I (HIGH SENSITIVITY)  EKG EKG Interpretation Date/Time:  Monday October 15 2022 17:20:18 EDT Ventricular Rate:  67 PR Interval:  156 QRS Duration:  86 QT Interval:  426 QTC Calculation: 450 R Axis:   67  Text Interpretation: Normal sinus rhythm Nonspecific ST and T wave abnormality Abnormal ECG When compared with ECG of 06-Sep-2022 18:40, PREVIOUS ECG IS PRESENT Confirmed by Blane Ohara [phone]) on 10/15/2022 9:21:34 PM  Medications Ordered in ED Medications  sodium chloride 0.9 % bolus 500 mL (0 mLs Intravenous Stopped 10/15/22 2245)  potassium chloride SA (KLOR-CON M) CR tablet 40 mEq (40 mEq Oral Given 10/15/22 2137)  sodium chloride 0.9 % bolus 500 mL (500 mLs Intravenous New Bag/Given 10/16/22 0057)    ED Course/ Medical Decision Making/ A&P Clinical Course as of 10/16/22 0101  Mon Oct 15, 2022  2049 Urinalysis, Routine w reflex microscopic -Urine, Clean Catch(!) [CA]    Clinical Course User Index [CA] Manuela Neptune, MD  }                          Medical Decision Making Amount and/or Complexity of Data Reviewed Labs: ordered. Decision-making details documented in ED Course. Radiology: ordered.  Risk Prescription drug management. Decision regarding hospitalization.  [name] is an 82 yo female with a PMH of early onset Alzheimers, anemia, gastric ulcer, diverticulitis, hypothyroidism, HTN, rheumatic fever, and breast  cancer s/p radiation therapy. Presented with generalized weakness and decreased appettite worsening these last two days. There was a concern for a UTI given suprapubic pain but sample has squamous epithelial cells. No nitrates or leukocytes. Benign neurological exam, with negative pronator drift, no focal deficits. Normal nose to finger test and heel to shin. Unable to ambulate. Has recently re-started her meds now given by husband. Possibly dizziness due to vertigo or orthostatics r/o stroke with MRI.  #Generalized weakness  -unable to ambulate  -Normal UA  -Administered fluids for possible dehydration -FU with MRI non contrast   #Hypokalemia  PO K given  Magnesium WNL  Final Clinical Impression(s) / ED Diagnoses Final diagnoses:  Generalized weakness  Renal insufficiency    Rx / DC Orders ED Discharge Orders     None         Hassan Rowan, [place], MD 10/16/22 0101    Manuela Neptune, MD 10/16/22 1053    Blane Ohara, MD 10/16/22 2349

## 2022-10-15 NOTE — Telephone Encounter (Signed)
I spoke with the patient and she reported no abdominal pain or diarrhea. Patient was advised to visit the nearest ED or Urgent care for evaluation but patient declined and stated she has done this before and does not feeling like waiting.

## 2022-10-15 NOTE — Telephone Encounter (Signed)
Patient informed of the message below. Patient is currently at ED

## 2022-10-15 NOTE — ED Triage Notes (Signed)
Pt to ED c/o generalized weakness x 4 days, reports hx of needing blood transfusions, reports worried that "blood is low"

## 2022-10-16 ENCOUNTER — Emergency Department (HOSPITAL_COMMUNITY): Payer: Medicare Other

## 2022-10-16 ENCOUNTER — Telehealth: Payer: Self-pay | Admitting: Family Medicine

## 2022-10-16 ENCOUNTER — Ambulatory Visit: Payer: Medicare Other | Admitting: Family Medicine

## 2022-10-16 DIAGNOSIS — R9089 Other abnormal findings on diagnostic imaging of central nervous system: Secondary | ICD-10-CM | POA: Diagnosis not present

## 2022-10-16 DIAGNOSIS — I6782 Cerebral ischemia: Secondary | ICD-10-CM | POA: Diagnosis not present

## 2022-10-16 LAB — URINE CULTURE: Culture: <10000 — AB

## 2022-10-16 LAB — TROPONIN I (HIGH SENSITIVITY): Troponin I (High Sensitivity): 5 ng/L (ref <18)

## 2022-10-16 MED ORDER — SODIUM CHLORIDE 0.9 % IV BOLUS
500.0000 mL | Freq: Once | INTRAVENOUS | Status: AC
  Administered 2022-10-16: 500 mL via INTRAVENOUS

## 2022-10-16 NOTE — ED Provider Notes (Signed)
Blood pressure (!) 155/54, pulse 64, temperature 98.6 F (37 C), temperature source Oral, resp. rate 14, height 5' (1.524 m), weight 64.9 kg, SpO2 98 %.  Assuming care from Dr. Jodi Mourning.  In short, [name] is a 82 y.o. female with a chief complaint of Weakness .  Refer to the original H&P for additional details.  The current plan of care is to follow up after MRI and reassess.  02:51 AM MRI shows no evidence of acute stroke.  I went back to update the family at bedside and patient.  She states he is feeling much better after treatment here in the emergency department.  She has improved energy and patient/family would like to be discharged.  This seems reasonable.  UA is equivocal but squamous cells are present.  Will send this for culture. Appears stable for discharge.     Maia Plan, MD 10/16/22 [id number]

## 2022-10-16 NOTE — Telephone Encounter (Signed)
Pt came in person with her husband for an appointment with Dr. Clent Ridges. However, appointment with Dr. Clent Ridges was cancelled per provider who stated pt would need to see her primary care Dr. Caryl Never. The pt was contacted and spoken to directly to let her know appt was cancelled. Pt states she does not remember this call, her husband was very upset and stated that he was supposed to be contacted for any appointments. I let them know that Dr. Lucie Leather next available appointment would be on 10/24/22. Pt's husband was very upset and declined to accept appointment. He said he felt like the situation was being extremely mishandled and that he may want to change providers. He left without accepting any appointments on behalf of the pt.

## 2022-10-16 NOTE — ED Notes (Signed)
Ambulated patient to the bathroom

## 2022-10-16 NOTE — Telephone Encounter (Signed)
Noted.   We will be happy to help accommodate and transfers if they so desire.  I am also happy to see her back in follow up as soon as we can get in.  Obviously, my schedules been very full with my recent medical absence which has made it challenging to get people in a timely manner  Kristian Covey MD [facility]

## 2022-10-16 NOTE — Discharge Instructions (Signed)
He was seen in the emergency room today with weakness.  Your lab work shows some mild dehydration.  Please follow with your primary care doctor in the coming week for reassessment and possible repeat blood work.  Your MRI did not show stroke.  If you develop any new or suddenly worsening symptoms please return to the emergency department.

## 2022-10-16 NOTE — Telephone Encounter (Signed)
Talk pt and let her know that dr.Fry was unable to see her today

## 2022-10-17 ENCOUNTER — Ambulatory Visit: Payer: Self-pay

## 2022-10-17 NOTE — Telephone Encounter (Signed)
Chief Complaint: Decrease appetite Symptoms: not eating much, generalized weakness Frequency: onset 7 days ongoing Pertinent Negatives: Patient denies decreased output for urine and BM. Disposition: [x] ED /[] Urgent Care (no appt availability in office) / [] Appointment(In office/virtual)/ []  [place] [facility]/ [] Home Care/ [] Refused Recommended Disposition /[] [place] [facility]/ []  Follow-up with PCP Additional Notes: Spoke to patient 's husband who stated the patient is not eating much. Patient has only had a half of ensure drink today and this has been her average intake for over 7 days. [name] states that she is weak now and it is difficult to get her out of the house and into the car. Advised the patient's husband to take patient back to the ED for further evaluation. He stated that he is not sure if that would be any help now. Also advised to call PCP office for more resources and recommendations. Patient husband is unsure if he will follow the advice given at this time.   Summary: 1 week of not eating   Pt's spouse is calling on behalf of pt. Pt's spouse says that pt hasn't eaten in seven days. He states that he has taken her to the emergency room on Monday and they said she had low potassium and needed fluids, but pt is failing to get better. Pt's husband says he has reached out to pt's PCP and per pt's husband PCP didn't help. Pt would like some advice or resources on what to do and how to handle his wife's condition.     Reason for Disposition  Nursing judgment  Protocols used: No Guideline or Reference Available-A-[facility]

## 2022-10-19 ENCOUNTER — Telehealth: Payer: Self-pay

## 2022-10-19 LAB — TYPE AND SCREEN
ABO/RH(D): O NEG
Antibody Screen: POSITIVE
DAT, IgG: NEGATIVE
Donor AG Type: NEGATIVE
Unit division: 0

## 2022-10-19 LAB — BPAM RBC
Blood Product Expiration Date: 202407152359
Blood Product Expiration Date: 202407292359
Unit Type and Rh: 9500
Unit Type and Rh: 9500

## 2022-10-19 NOTE — Transitions of Care (Post Inpatient/ED Visit) (Signed)
   10/19/2022  Name: [name] MRN: [id number] DOB: 18-Nov-1940  Today's TOC FU Call Status: Today's TOC FU Call Status:: Unsuccessul Call (1st Attempt) Unsuccessful Call (1st Attempt) Date: 10/19/22  Red on EMMI-ED Discharge Alert Date & Reason:10/17/22 "Scheduled follow-up appt? No"   Attempted to reach the patient regarding the most recent Inpatient/ED visit.  Follow Up Plan: Additional outreach attempts will be made to reach the patient to complete the Transitions of Care (Post Inpatient/ED visit) call.      Antionette Fairy, RN,BSN,CCM [facility] Care Management Care Management Community Coordinator Direct Phone: [phone] Toll Free: [phone] Fax: [phone]

## 2022-10-22 ENCOUNTER — Telehealth: Payer: Self-pay

## 2022-10-22 NOTE — Transitions of Care (Post Inpatient/ED Visit) (Signed)
10/22/2022  Name: [name] MRN: [id number] DOB: May 17, 1940  Today's TOC FU Call Status: Today's TOC FU Call Status:: Successful TOC FU Call Competed TOC FU Call Complete Date: 10/22/22  Red on EMMI-ED Discharge Alert Date & Reason:10/17/22 "Scheduled follow-up appt? No"   Transition Care Management Follow-up Telephone Call Date of Discharge: 10/16/22 Discharge [facility]) Type of Discharge: Emergency Department Reason for ED Visit: Other: ("generalized weakness") How have you been since you were released from the hospital?: Better (Pt reported she "feels so much better-still a little weak but improving." She hs been eating and resting well. She voices she is able to ambulate without any assistance.) Any questions or concerns?: No  Items Reviewed: Did you receive and understand the discharge instructions provided?: Yes Medications obtained,verified, and reconciled?: Partial Review Completed Reason for Partial Mediation Review: pt did not want to review meds-states spouse manages meds for her Any new allergies since your discharge?: No Dietary orders reviewed?: Yes Type of Diet Ordered:: low salt/heart healthy Do you have support at home?: Yes People in Home: spouse Name of Support/Comfort Primary Source: Maisie Fus  Medications Reviewed Today: Medications Reviewed Today     Reviewed by Charlyn Minerva, RN (Registered Nurse) on 10/22/22 at 0901  Med List Status: <None>   Medication Order Taking? Sig Documenting Provider Last Dose Status Informant  amLODipine (NORVASC) 5 MG tablet [id number] No TAKE ONE TABLET BY MOUTH EVERY MORNING Burchette, Elberta Fortis, MD Taking Active Spouse/Significant Other  atorvastatin (LIPITOR) 20 MG tablet [id number] No Take 1 tablet (20 mg total) by mouth daily.  Patient taking differently: Take 20 mg by mouth at bedtime.   Kristian Covey, MD Taking Active Spouse/Significant Other  betamethasone acetate-betamethasone  sodium phosphate (CELESTONE) injection 12 mg [id number]   Tyrell Antonio, MD  Active   donepezil (ARICEPT) 5 MG tablet [id number] No Take 1 tablet (5 mg total) by mouth at bedtime. Windell Norfolk, MD Taking Active   DRAMAMINE 50 MG tablet [id number] No Take 150 mg by mouth at bedtime as needed (for sleep). [provider] Taking Active Spouse/Significant Other  esomeprazole (NEXIUM) 40 MG capsule [id number] No TAKE ONE CAPSULE BY MOUTH ONCE DAILY  Patient taking differently: Take 40 mg by mouth daily before breakfast.   Kristian Covey, MD Taking Active Spouse/Significant Other  famotidine (PEPCID) 40 MG tablet [id number] No Take 40 mg by mouth daily before supper. [provider] Taking Active Spouse/Significant Other  furosemide (LASIX) 20 MG tablet [id number] No TAKE ONE TABLET BY MOUTH EVERY MORNING Burchette, Elberta Fortis, MD Taking Active Spouse/Significant Other  levothyroxine (SYNTHROID) 50 MCG tablet [id number] No TAKE ONE TABLET BY MOUTH BEFORE BREAKFAST  Patient taking differently: Take 50 mcg by mouth daily before breakfast.   Kristian Covey, MD Taking Active Spouse/Significant Other  losartan-hydrochlorothiazide (HYZAAR) 100-12.5 MG tablet [id number] No TAKE ONE TABLET BY MOUTH EVERY MORNING Burchette, Elberta Fortis, MD Taking Active Spouse/Significant Other  Melatonin 3 MG TBDP [id number] No Take 3 mg by mouth at bedtime. [provider] Taking Active   metoprolol tartrate (LOPRESSOR) 25 MG tablet [id number] No TAKE ONE TABLET BY MOUTH EVERY MORNING and TAKE ONE TABLET BY MOUTH EVERYDAY AT BEDTIME  Patient taking differently: Take 25 mg by mouth in the morning and at bedtime.   Kristian Covey, MD Taking Active Spouse/Significant Other  Multiple Vitamins-Minerals (WOMENS MULTIVITAMIN) TABS [id number] No Take 1 tablet by mouth at bedtime. [provider] Taking Active Spouse/Significant Other  pantoprazole (PROTONIX)  40 MG tablet [id number] No Take 1 tablet (40  mg total) by mouth 2 (two) times daily before a meal. Hughie Closs, MD Taking Active   potassium chloride (KLOR-CON 10) 10 MEQ tablet [id number] No Take 1 tablet (10 mEq total) by mouth 2 (two) times daily. Nelwyn Salisbury, MD Taking Active Spouse/Significant Other  tamoxifen (NOLVADEX) 20 MG tablet [id number]  TAKE ONE TABLET BY MOUTH EVERY MORNING Rachel Moulds, MD  Active   TUMS 500 MG chewable tablet [id number] No Chew 1-2 tablets by mouth daily as needed for indigestion or heartburn. [provider] Taking Active Spouse/Significant Other  TYLENOL 325 MG tablet [id number] No Take 325-650 mg by mouth every 6 (six) hours as needed for mild pain or headache. [provider] Taking Active Spouse/Significant Other            Home Care and Equipment/Supplies: Were Home Health Services Ordered?: NA Any new equipment or medical supplies ordered?: NA  Functional Questionnaire: Do you need assistance with bathing/showering or dressing?: No Do you need assistance with meal preparation?: No Do you need assistance with eating?: No Do you have difficulty maintaining continence: No Do you need assistance with getting out of bed/getting out of a chair/moving?: No Do you have difficulty managing or taking your medications?: Yes (spouse manages for her)  Follow up appointments reviewed: PCP Follow-up appointment confirmed?: No (Pt declined making PCP appt during this call-states they will call at later time to make an appt) MD Provider Line [phone] Given: No Specialist Hospital Follow-up appointment confirmed?: NA Do you need transportation to your follow-up appointment?: No (pt confirms spouse able to get her to appts) Do you understand care options if your condition(s) worsen?: Yes-patient verbalized understanding  SDOH Interventions Today    Flowsheet Row Most Recent Value  SDOH Interventions   Food Insecurity Interventions Intervention Not Indicated   Transportation Interventions Intervention Not Indicated      TOC Interventions Today    Flowsheet Row Most Recent Value  TOC Interventions   TOC Interventions Discussed/Reviewed TOC Interventions Discussed      Interventions Today    Flowsheet Row Most Recent Value  General Interventions   General Interventions Discussed/Reviewed General Interventions Discussed, Doctor Visits  Doctor Visits Discussed/Reviewed Doctor Visits Discussed, PCP  PCP/Specialist Visits Compliance with follow-up visit  Education Interventions   Education Provided Provided Education  Provided Verbal Education On Nutrition, When to see the doctor, Other  Pharmacy Interventions   Pharmacy Dicussed/Reviewed Pharmacy Topics Discussed  Safety Interventions   Safety Discussed/Reviewed Safety Discussed, Home Safety       Alessandra Grout [facility] Care Management Care Management Community Coordinator Direct Phone: [phone] Toll Free: [phone] Fax: [phone]

## 2022-11-02 ENCOUNTER — Ambulatory Visit: Payer: Medicare Other | Admitting: Family Medicine

## 2022-11-02 ENCOUNTER — Telehealth: Payer: Self-pay | Admitting: Family Medicine

## 2022-11-02 NOTE — Telephone Encounter (Signed)
Pt's spouse called in stating patient was having difficulty walking, stumbling and having leg weakness. They were directed by Triage Nurse to call [id number] immediately. Patient did not take advise of nurse and called office to schedule appointment. Fax arrived from Triage Nurse dictating her decision for patient, called patient back and advised to go to ED or call EMS. Appointment was canceled per office protocol.

## 2022-11-09 ENCOUNTER — Ambulatory Visit: Payer: Medicare Other | Admitting: Family Medicine

## 2022-11-09 ENCOUNTER — Encounter: Payer: Self-pay | Admitting: Family Medicine

## 2022-11-09 VITALS — BP 152/62 | HR 95 | Temp 98.3°F | Ht 60.0 in | Wt 146.2 lb

## 2022-11-09 DIAGNOSIS — I5033 Acute on chronic diastolic (congestive) heart failure: Secondary | ICD-10-CM

## 2022-11-09 DIAGNOSIS — R531 Weakness: Secondary | ICD-10-CM | POA: Diagnosis not present

## 2022-11-09 DIAGNOSIS — I10 Essential (primary) hypertension: Secondary | ICD-10-CM | POA: Diagnosis not present

## 2022-11-09 DIAGNOSIS — E038 Other specified hypothyroidism: Secondary | ICD-10-CM | POA: Diagnosis not present

## 2022-11-09 DIAGNOSIS — D500 Iron deficiency anemia secondary to blood loss (chronic): Secondary | ICD-10-CM | POA: Diagnosis not present

## 2022-11-09 DIAGNOSIS — R413 Other amnesia: Secondary | ICD-10-CM

## 2022-11-09 LAB — CBC WITH DIFFERENTIAL/PLATELET
Basophils Absolute: 0.1 10*3/uL (ref 0.0–0.1)
Basophils Relative: 0.9 % (ref 0.0–3.0)
Eosinophils Absolute: 0.1 10*3/uL (ref 0.0–0.7)
Eosinophils Relative: 1.8 % (ref 0.0–5.0)
HCT: 41.8 % (ref 36.0–46.0)
Hemoglobin: 13.2 g/dL (ref 12.0–15.0)
Lymphocytes Relative: 15.5 % (ref 12.0–46.0)
Lymphs Abs: 1.1 10*3/uL (ref 0.7–4.0)
MCHC: 31.7 g/dL (ref 30.0–36.0)
MCV: 83.2 fl (ref 78.0–100.0)
Monocytes Absolute: 0.6 10*3/uL (ref 0.1–1.0)
Monocytes Relative: 8.7 % (ref 3.0–12.0)
Neutro Abs: 5 10*3/uL (ref 1.4–7.7)
Neutrophils Relative %: 73.1 % (ref 43.0–77.0)
Platelets: 266 10*3/uL (ref 150.0–400.0)
RBC: 5.03 Mil/uL (ref 3.87–5.11)
RDW: 19.7 % — ABNORMAL HIGH (ref 11.5–15.5)
WBC: 6.8 10*3/uL (ref 4.0–10.5)

## 2022-11-09 LAB — TSH: TSH: 1.4 u[IU]/mL (ref 0.35–5.50)

## 2022-11-09 LAB — BASIC METABOLIC PANEL
BUN: 19 mg/dL (ref 6–23)
CO2: 25 mEq/L (ref 19–32)
Calcium: 10.7 mg/dL — ABNORMAL HIGH (ref 8.4–10.5)
Chloride: 105 mEq/L (ref 96–112)
Creatinine, Ser: 0.72 mg/dL (ref 0.40–1.20)
GFR: 77.81 mL/min (ref >60.00)
Glucose, Bld: 146 mg/dL — ABNORMAL HIGH (ref 70–99)
Potassium: 5.1 mEq/L (ref 3.5–5.1)
Sodium: 141 mEq/L (ref 135–145)

## 2022-11-09 MED ORDER — ESOMEPRAZOLE MAGNESIUM 40 MG PO CPDR: 40.0000 mg | DELAYED_RELEASE_CAPSULE | Freq: Every day | ORAL | 1 refills | Status: DC

## 2022-11-09 MED ORDER — ATORVASTATIN CALCIUM 20 MG PO TABS: 20.0000 mg | ORAL_TABLET | Freq: Every day | ORAL | 3 refills | Status: DC

## 2022-11-09 MED ORDER — LOSARTAN POTASSIUM-HCTZ 100-12.5 MG PO TABS: 1.0000 | ORAL_TABLET | Freq: Every morning | ORAL | 3 refills | Status: DC

## 2022-11-09 MED ORDER — AMLODIPINE BESYLATE 5 MG PO TABS: 5.0000 mg | ORAL_TABLET | Freq: Every morning | ORAL | 3 refills | Status: DC

## 2022-11-09 MED ORDER — LEVOTHYROXINE SODIUM 50 MCG PO TABS: 50.0000 ug | ORAL_TABLET | Freq: Every day | ORAL | 3 refills | Status: DC

## 2022-11-09 MED ORDER — POTASSIUM CHLORIDE ER 10 MEQ PO TBCR: 10.0000 meq | EXTENDED_RELEASE_TABLET | Freq: Two times a day (BID) | ORAL | 3 refills | Status: DC

## 2022-11-09 MED ORDER — METOPROLOL TARTRATE 25 MG PO TABS: 25.0000 mg | ORAL_TABLET | Freq: Two times a day (BID) | ORAL | 3 refills | Status: DC

## 2022-11-09 MED ORDER — FUROSEMIDE 20 MG PO TABS: 20.0000 mg | ORAL_TABLET | Freq: Every morning | ORAL | 3 refills | Status: DC

## 2022-11-09 NOTE — Progress Notes (Signed)
Established Patient Office Visit  Subjective   Patient ID: [name], female    DOB: 04-09-1941  Age: 82 y.o. MRN: [id number]  Chief Complaint  Patient presents with   Medical Management of Chronic Issues    HPI  {History (Optional):23778} Amila is here today accompanied by her husband.  Limited history available from patient secondary to her dementia.  Her problems include history of heart failure with preserved ejection fraction, history of recent acute gastric ulcer with associated GI bleed and severe anemia, history of migraine headaches, hypothyroidism, hypertension, chronic kidney disease, history of breast cancer.  Back in early May she developed severe weakness since he was admitted to the hospital with anemia and diagnosed with large gastric ulcer.  She was discharged and then subsequently hemoglobin dropped again and this required transfusion.  She had transfusion reaction.  She was then seen in the ER again July 1 with nonspecific symptoms of increased weakness.  Hemoglobin 14.4.  Potassium 3.2.  MRI of the brain showed no acute intracranial abnormality.  Findings consistent with chronic small vessel ischemia.  She complains of generalized fatigue.  Apparently is up much of the night and taking multiple small naps throughout the day according to husband.  Appetite fair.  Reportedly taking in 3-4 ensures per day.  No complaints with any urinary symptoms.  No chest pains.  No fever.  Denies any abdominal pain.  No melena.  Past Medical History:  Diagnosis Date   Anemia    Anxiety    Arthritis    knees   Atrophic vaginitis    Cancer (HCC)    breast cancer - left   Diverticulosis    Endometriosis    Family history of breast cancer    Family history of colon cancer    GERD (gastroesophageal reflux disease)    Headache(784.0)    Heart murmur    as a child only   History of kidney stones    passed stones, no surgery required   History of radiation therapy  01/30/16- 03/02/16   Left Breast 50 Gy in 25 fractions.    Hyperlipidemia    Hypertension    Hypothyroidism    Internal hemorrhoids    Osteopenia    Osteoporosis    Personal history of chemotherapy    Personal history of radiation therapy    Rheumatic fever    age 60   Sleep apnea    " very mild" does not wear CPAP   Thyroid disease    Hypothyroid   Ulcer    UTI (lower urinary tract infection)    Past Surgical History:  Procedure Laterality Date   APPENDECTOMY     BIOPSY  08/23/2022   Procedure: BIOPSY;  Surgeon: Beverley Fiedler, MD;  Location: [facility] ENDOSCOPY;  Service: Gastroenterology;;   BREAST BIOPSY Left 08/19/2015   BREAST LUMPECTOMY Left 09/08/2015   BREAST LUMPECTOMY WITH RADIOACTIVE SEED AND SENTINEL LYMPH NODE BIOPSY Left 09/09/2015   Procedure: LEFT BREAST LUMPECTOMY WITH RADIOACTIVE SEED AND SENTINEL LYMPH NODE BIOPSY;  Surgeon: Avel Peace, MD;  Location: [facility] OR;  Service: General;  Laterality: Left;   COLONOSCOPY     COLONOSCOPY N/A 08/23/2022   Procedure: COLONOSCOPY;  Surgeon: Beverley Fiedler, MD;  Location: [facility] ENDOSCOPY;  Service: Gastroenterology;  Laterality: N/A;   DILATATION & CURETTAGE/HYSTEROSCOPY WITH MYOSURE N/A 10/09/2016   Procedure: DILATATION & CURETTAGE/HYSTEROSCOPY WITH MYOSURE;  Surgeon: Genia Del, MD;  Location: [facility] ORS;  Service: Gynecology;  Laterality: N/A;  requesting  7:30am in our block  requests one hour   DILATATION & CURETTAGE/HYSTEROSCOPY WITH MYOSURE N/A 03/19/2022   Procedure: DILATATION & CURETTAGE/HYSTEROSCOPY;  Surgeon: Romualdo Bolk, MD;  Location: [facility];  Service: Gynecology;  Laterality: N/A;   ESOPHAGOGASTRODUODENOSCOPY (EGD) WITH PROPOFOL N/A 08/23/2022   Procedure: ESOPHAGOGASTRODUODENOSCOPY (EGD) WITH PROPOFOL;  Surgeon: Beverley Fiedler, MD;  Location: [facility] ENDOSCOPY;  Service: Gastroenterology;  Laterality: N/A;   ESOPHAGOGASTRODUODENOSCOPY ENDOSCOPY     IR GENERIC HISTORICAL  12/05/2015   IR CV LINE  INJECTION 12/05/2015 Jolaine Click, MD [facility]-INTERV RAD   LAPAROSCOPIC ENDOMETRIOSIS FULGURATION  1978   PELVIC LAPAROSCOPY     PORTACATH PLACEMENT N/A 10/04/2015   Procedure: INSERTION PORT-A-CATH WITH ULTRASOUND;  Surgeon: Avel Peace, MD;  Location: [facility] OR;  Service: General;  Laterality: N/A;   portacath removal     TONSILLECTOMY     ULNAR NERVE REPAIR  2010    reports that she has never smoked. She has never used smokeless tobacco. She reports that she does not drink alcohol and does not use drugs. family history includes Breast cancer in her mother and paternal grandmother; Colon cancer in her father; Diabetes in her paternal grandmother; Heart disease in her father and maternal grandmother; Hypertension in her mother; Stroke in her father. Allergies  Allergen Reactions   Ferrlecit [Na Ferric Gluc Cplx In Sucrose] Shortness Of Breath and Other (See Comments)    Made the hands and feet feel odd, also   Nitrofurantoin Nausea And Vomiting and Other (See Comments)    GI upset   Sulfamethoxazole-Trimethoprim Nausea And Vomiting and Other (See Comments)    GI upset    Review of Systems  Constitutional:  Positive for malaise/fatigue. Negative for chills and fever.  Respiratory:  Negative for cough and shortness of breath.   Cardiovascular:  Negative for chest pain.  Gastrointestinal:  Negative for abdominal pain, blood in stool, diarrhea, melena, nausea and vomiting.  Genitourinary:  Negative for dysuria.  Neurological:  Negative for speech change, focal weakness and headaches.      Objective:     BP (!) 152/62 (BP Location: Left Arm, Cuff Size: Normal)   Pulse 95   Temp 98.3 F (36.8 C) (Oral)   Ht 5' (1.524 m)   Wt 146 lb 3.2 oz (66.3 kg)   SpO2 98%   BMI 28.55 kg/m  BP Readings from Last 3 Encounters:  11/09/22 (!) 152/62  10/16/22 (!) 155/54  10/09/22 130/68   Wt Readings from Last 3 Encounters:  11/09/22 146 lb 3.2 oz (66.3 kg)  10/15/22 143 lb (64.9 kg)  10/09/22  153 lb (69.4 kg)      Physical Exam Vitals reviewed.  HENT:     Head: Normocephalic and atraumatic.  Cardiovascular:     Rate and Rhythm: Normal rate and regular rhythm.  Pulmonary:     Effort: Pulmonary effort is normal.     Breath sounds: Normal breath sounds. No wheezing or rales.  Musculoskeletal:     Cervical back: Neck supple.     Right lower leg: No edema.     Left lower leg: No edema.  Neurological:     General: No focal deficit present.      No results found for any visits on 11/09/22.  Last CBC Lab Results  Component Value Date   WBC 9.6 10/15/2022   HGB 14.4 10/15/2022   HCT 45.1 10/15/2022   MCV 84.6 10/15/2022   MCH 27.0 10/15/2022   RDW 17.9 (  H) 10/15/2022   PLT 352 10/15/2022   Last metabolic panel Lab Results  Component Value Date   GLUCOSE 176 (H) 10/15/2022   NA 136 10/15/2022   K 3.2 (L) 10/15/2022   CL 96 (L) 10/15/2022   CO2 26 10/15/2022   BUN 23 10/15/2022   CREATININE 1.09 (H) 10/15/2022   GFRNONAA 51 (L) 10/15/2022   CALCIUM 9.7 10/15/2022   PROT 5.8 (L) 09/06/2022   ALBUMIN 3.4 (L) 09/06/2022   BILITOT 0.7 09/06/2022   ALKPHOS 45 09/06/2022   AST 30 09/06/2022   ALT 20 09/06/2022   ANIONGAP 14 10/15/2022   Last lipids Lab Results  Component Value Date   CHOL 154 08/02/2021   HDL 47.80 08/02/2021   LDLDIRECT 74.0 08/02/2021   TRIG 325.0 (H) 08/02/2021   CHOLHDL 3 08/02/2021   Last hemoglobin A1c Lab Results  Component Value Date   HGBA1C 5.8 (A) 03/05/2022   Last thyroid functions Lab Results  Component Value Date   TSH 1.38 08/02/2021      The ASCVD Risk score (Arnett DK, et al., 2019) failed to calculate for the following reasons:   The 2019 ASCVD risk score is only valid for ages 62 to 76    Assessment & Plan:   #1 generalized weakness and fatigue.  History is difficult as patient has fairly advanced dementia.  Does have very poor sleep quality napping frequently through the day.  We recommend strict  avoidance of anticholinergic such as Benadryl.  Try to avoid or at least limit daytime napping.  Check labs as below  #2 hypothyroidism.  No recent TSH.  On low-dose thyroid replacement.  Recheck TSH today  #3 history of upper GI bleed secondary to gastric ulcer.  She was consuming apparently fair amount of over-the-counter nonsteroidals prior to her diagnosis.  Husband is keeping her away from any nonsteroidals and using Tylenol for pain.  Recheck CBC  #4 recent mild hypokalemia.  Patient on Hyzaar.  She is on potassium replacement currently.  Recheck basic metabolic panel  #5 short-term memory loss/dementia followed by neurology and on low-dose Aricept.   Return in about 3 months (around 02/09/2023).    Evelena Peat, MD

## 2022-11-20 ENCOUNTER — Encounter: Payer: Self-pay | Admitting: Adult Health

## 2022-11-20 ENCOUNTER — Inpatient Hospital Stay: Payer: Medicare Other | Attending: Hematology and Oncology

## 2022-11-20 ENCOUNTER — Inpatient Hospital Stay: Payer: Medicare Other | Admitting: Adult Health

## 2022-11-20 VITALS — BP 110/38 | HR 72 | Temp 97.6°F | Resp 19 | Wt 147.6 lb

## 2022-11-20 DIAGNOSIS — C50412 Malignant neoplasm of upper-outer quadrant of left female breast: Secondary | ICD-10-CM | POA: Insufficient documentation

## 2022-11-20 DIAGNOSIS — Z170 Estrogen receptor positive status [ER+]: Secondary | ICD-10-CM | POA: Diagnosis not present

## 2022-11-20 DIAGNOSIS — Z923 Personal history of irradiation: Secondary | ICD-10-CM | POA: Diagnosis not present

## 2022-11-20 DIAGNOSIS — Z1231 Encounter for screening mammogram for malignant neoplasm of breast: Secondary | ICD-10-CM | POA: Diagnosis not present

## 2022-11-20 DIAGNOSIS — Z79810 Long term (current) use of selective estrogen receptor modulators (SERMs): Secondary | ICD-10-CM | POA: Insufficient documentation

## 2022-11-20 DIAGNOSIS — Z9221 Personal history of antineoplastic chemotherapy: Secondary | ICD-10-CM | POA: Diagnosis not present

## 2022-11-20 DIAGNOSIS — D509 Iron deficiency anemia, unspecified: Secondary | ICD-10-CM | POA: Diagnosis not present

## 2022-11-20 LAB — CBC WITH DIFFERENTIAL/PLATELET
Abs Immature Granulocytes: 0.03 10*3/uL (ref 0.00–0.07)
Basophils Absolute: 0.1 10*3/uL (ref 0.0–0.1)
Basophils Relative: 1 %
Eosinophils Absolute: 0.4 10*3/uL (ref 0.0–0.5)
Eosinophils Relative: 5 %
HCT: 41.6 % (ref 36.0–46.0)
Hemoglobin: 13.4 g/dL (ref 12.0–15.0)
Immature Granulocytes: 0 %
Lymphocytes Relative: 22 %
Lymphs Abs: 1.9 10*3/uL (ref 0.7–4.0)
MCH: 26.9 pg (ref 26.0–34.0)
MCHC: 32.2 g/dL (ref 30.0–36.0)
MCV: 83.4 fL (ref 80.0–100.0)
Monocytes Absolute: 1 10*3/uL (ref 0.1–1.0)
Monocytes Relative: 12 %
Neutro Abs: 5.2 10*3/uL (ref 1.7–7.7)
Neutrophils Relative %: 60 %
Platelets: 269 10*3/uL (ref 150–400)
RBC: 4.99 MIL/uL (ref 3.87–5.11)
RDW: 16.3 % — ABNORMAL HIGH (ref 11.5–15.5)
WBC: 8.6 10*3/uL (ref 4.0–10.5)
nRBC: 0 % (ref 0.0–0.2)

## 2022-11-20 NOTE — Progress Notes (Unsigned)
[facility] Cancer Follow up:    Stephanie Covey, MD [address] [place] [address]   DIAGNOSIS: Cancer Staging  Malignant neoplasm of upper-outer quadrant of left breast in female, estrogen receptor positive (HCC) Staging form: Breast, AJCC 7th Edition - Clinical: Stage IA (T1b, N0, M0) - Signed by Lonie Peak, MD on 09/07/2015 Laterality: Left - Pathologic: Stage IA (T1b, N0, cM0) - Unsigned   SUMMARY OF ONCOLOGIC HISTORY: Oncology History  Malignant neoplasm of upper-outer quadrant of left breast in female, estrogen receptor positive (HCC)  08/19/2015 Initial Biopsy   Left core biopsy 2:30 o'clock: IDC, ER+(100%), PR+(100%), Ki-67 10%, HER-2 negative (ratio 1.23).    09/07/2015 Initial Diagnosis   Malignant neoplasm of upper-outer quadrant of left breast in female, estrogen receptor positive (HCC)   09/09/2015 Surgery   Left breast lumpectomy ([facility]): IDC, DCIS, grade 1, 0.9 cm, margins negative, 2 SLN negative, T1b, N0   09/09/2015 Oncotype testing   29/19% intermediate risk   09/19/2015 Genetic Testing   genetics testing through the Breast/Ovarian gene panel offered by [facility] found no deleterious mutations in ATM, BARD1, BRCA1, BRCA2, BRIP1, CDH1, CHEK2, EPCAM, FANCC, MLH1, MSH2, MSH6, NBN, PALB2, PMS2, PTEN, RAD51C, RAD51D, TP53, and XRCC2.   10/24/2015 - 12/26/2015 Adjuvant Chemotherapy   Docetaxel and Cyclophosphamide x 4 cycles   01/30/2016 - 03/02/2016 Radiation Therapy   Adjuvant radiation [facility]): Left breast: 50 Gy in 25 fractions   04/2016 -  Anti-estrogen oral therapy   Tamoxifen 20mg  daily     CURRENT THERAPY: Tamoxifen  INTERVAL HISTORY: [name] 82 y.o. female returns for   Repeat EGD on 12/06/2022.   Patient Active Problem List   Diagnosis Date Noted  . Ankle edema, bilateral 10/09/2022  . Acute gastric ulcer without hemorrhage or perforation 08/23/2022  . GIB (gastrointestinal bleeding) 08/22/2022  .  Iron deficiency anemia due to chronic blood loss 08/22/2022  . Heme positive stool 08/22/2022  . GI bleed 08/21/2022  . Acute blood loss anemia 08/21/2022  . RUQ pain 08/21/2022  . Hypokalemia 08/21/2022  . Chronic kidney disease, stage III (moderate) (HCC) 08/21/2022  . History of breast cancer 08/21/2022  . Short-term memory loss 08/21/2022  . Cognitive impairment 10/04/2021  . (HFpEF) heart failure with preserved ejection fraction (HCC) 10/25/2020  . Fusion of spine of thoracolumbar region 02/21/2018  . Chronic midline low back pain with bilateral sciatica 11/06/2016  . Genetic testing 09/23/2015  . Malignant neoplasm of upper-outer quadrant of left breast in female, estrogen receptor positive (HCC) 09/07/2015  . Family history of breast cancer   . Family history of colon cancer   . Prediabetes 08/30/2014  . Endometriosis   . Osteopenia   . Hypertension   . UTI (lower urinary tract infection) 03/05/2011  . Dysuria 03/05/2011  . Hypothyroidism 06/06/2009  . FATIGUE 06/06/2009  . ELEVATED BLOOD PRESSURE 06/06/2009  . Disorder of bone and cartilage 09/09/2008  . SCOLIOSIS 09/09/2008  . Hyperlipidemia 07/30/2008  . Migraine headache 07/30/2008  . GERD 07/30/2008  . ENTHESOPATHY OF HIP REGION 07/30/2008  . HEADACHE 07/30/2008    is allergic to ferrlecit [na ferric gluc cplx in sucrose], nitrofurantoin, and sulfamethoxazole-trimethoprim.  MEDICAL HISTORY: Past Medical History:  Diagnosis Date  . Anemia   . Anxiety   . Arthritis    knees  . Atrophic vaginitis   . Cancer [facility])    breast cancer - left  . Diverticulosis   . Endometriosis   . Family history  of breast cancer   . Family history of colon cancer   . GERD (gastroesophageal reflux disease)   . Headache(784.0)   . Heart murmur    as a child only  . History of kidney stones    passed stones, no surgery required  . History of radiation therapy 01/30/16- 03/02/16   Left Breast 50 Gy in 25 fractions.   .  Hyperlipidemia   . Hypertension   . Hypothyroidism   . Internal hemorrhoids   . Osteopenia   . Osteoporosis   . Personal history of chemotherapy   . Personal history of radiation therapy   . Rheumatic fever    age 22  . Sleep apnea    " very mild" does not wear CPAP  . Thyroid disease    Hypothyroid  . Ulcer   . UTI (lower urinary tract infection)     SURGICAL HISTORY: Past Surgical History:  Procedure Laterality Date  . APPENDECTOMY    . BIOPSY  08/23/2022   Procedure: BIOPSY;  Surgeon: Beverley Fiedler, MD;  Location: [facility] ENDOSCOPY;  Service: Gastroenterology;;  . BREAST BIOPSY Left 08/19/2015  . BREAST LUMPECTOMY Left 09/08/2015  . BREAST LUMPECTOMY WITH RADIOACTIVE SEED AND SENTINEL LYMPH NODE BIOPSY Left 09/09/2015   Procedure: LEFT BREAST LUMPECTOMY WITH RADIOACTIVE SEED AND SENTINEL LYMPH NODE BIOPSY;  Surgeon: Avel Peace, MD;  Location: [facility] OR;  Service: General;  Laterality: Left;  . COLONOSCOPY    . COLONOSCOPY N/A 08/23/2022   Procedure: COLONOSCOPY;  Surgeon: Beverley Fiedler, MD;  Location: [facility] ENDOSCOPY;  Service: Gastroenterology;  Laterality: N/A;  . DILATATION & CURETTAGE/HYSTEROSCOPY WITH MYOSURE N/A 10/09/2016   Procedure: DILATATION & CURETTAGE/HYSTEROSCOPY WITH MYOSURE;  Surgeon: Genia Del, MD;  Location: [facility] ORS;  Service: Gynecology;  Laterality: N/A;  requesting 7:30am in our block  requests one hour  . DILATATION & CURETTAGE/HYSTEROSCOPY WITH MYOSURE N/A 03/19/2022   Procedure: DILATATION & CURETTAGE/HYSTEROSCOPY;  Surgeon: Romualdo Bolk, MD;  Location: [facility];  Service: Gynecology;  Laterality: N/A;  . ESOPHAGOGASTRODUODENOSCOPY (EGD) WITH PROPOFOL N/A 08/23/2022   Procedure: ESOPHAGOGASTRODUODENOSCOPY (EGD) WITH PROPOFOL;  Surgeon: Beverley Fiedler, MD;  Location: [facility] ENDOSCOPY;  Service: Gastroenterology;  Laterality: N/A;  . ESOPHAGOGASTRODUODENOSCOPY ENDOSCOPY    . IR GENERIC HISTORICAL  12/05/2015   IR CV LINE INJECTION  12/05/2015 Jolaine Click, MD [facility]-INTERV RAD  . LAPAROSCOPIC ENDOMETRIOSIS FULGURATION  1978  . PELVIC LAPAROSCOPY    . PORTACATH PLACEMENT N/A 10/04/2015   Procedure: INSERTION PORT-A-CATH WITH ULTRASOUND;  Surgeon: Avel Peace, MD;  Location: [facility] OR;  Service: General;  Laterality: N/A;  . portacath removal    . TONSILLECTOMY    . ULNAR NERVE REPAIR  2010    SOCIAL HISTORY: Social History   Socioeconomic History  . Marital status: Married    Spouse name: Not on file  . Number of children: 0  . Years of education: Not on file  . Highest education level: Not on file  Occupational History  . Occupation: retired  Tobacco Use  . Smoking status: Never  . Smokeless tobacco: Never  Vaping Use  . Vaping status: Never Used  Substance and Sexual Activity  . Alcohol use: No  . Drug use: No  . Sexual activity: Not Currently    Birth control/protection: Post-menopausal  Other Topics Concern  . Not on file  Social History Narrative   Retired Midwife.    Social Determinants of Health   Financial Resource Strain: Low Risk  (  05/22/2022)   Overall Financial Resource Strain (CARDIA)   . Difficulty of Paying Living Expenses: Not very hard  Food Insecurity: No Food Insecurity (10/22/2022)   Hunger Vital Sign   . Worried About Programme researcher, broadcasting/film/video in the Last Year: Never true   . Ran Out of Food in the Last Year: Never true  Transportation Needs: No Transportation Needs (10/22/2022)   PRAPARE - Transportation   . Lack of Transportation (Medical): No   . Lack of Transportation (Non-Medical): No  Physical Activity: Insufficiently Active (03/17/2021)   Exercise Vital Sign   . Days of Exercise per Week: 3 days   . Minutes of Exercise per Session: 10 min  Stress: No Stress Concern Present (05/22/2022)   [employer] of Occupational Health - Occupational Stress Questionnaire   . Feeling of Stress : Not at all  Social Connections: Socially Integrated (05/22/2022)   Social Connection  and Isolation Panel [NHANES]   . Frequency of Communication with Friends and Family: More than three times a week   . Frequency of Social Gatherings with Friends and Family: More than three times a week   . Attends Religious Services: More than 4 times per year   . Active Member of Clubs or Organizations: Yes   . Attends Banker Meetings: More than 4 times per year   . Marital Status: Married  Catering manager Violence: Not At Risk (03/17/2021)   Humiliation, Afraid, Rape, and Kick questionnaire   . Fear of Current or Ex-Partner: No   . Emotionally Abused: No   . Physically Abused: No   . Sexually Abused: No    FAMILY HISTORY: Family History  Problem Relation Age of Onset  . Breast cancer Mother        Age 49  . Hypertension Mother   . Heart disease Father   . Stroke Father   . Colon cancer Father        dx 13s  . Breast cancer Paternal Grandmother        Age 52  . Diabetes Paternal Grandmother   . Heart disease Maternal Grandmother     Review of Systems - Oncology    PHYSICAL EXAMINATION    Vitals:   11/20/22 1412 11/20/22 1430  BP: (!) 126/43 (!) 110/38  Pulse: 72 72  Resp: 19   Temp: 97.6 F (36.4 C)   SpO2: 100%     Physical Exam  LABORATORY DATA:  CBC    Component Value Date/Time   WBC 8.6 11/20/2022 1333   RBC 4.99 11/20/2022 1333   HGB 13.4 11/20/2022 1333   HGB 11.6 (L) 03/26/2018 1442   HGB 15.0 01/03/2017 1052   HCT 41.6 11/20/2022 1333   HCT 45.0 01/03/2017 1052   PLT 269 11/20/2022 1333   PLT 214 03/26/2018 1442   PLT 246 01/03/2017 1052   MCV 83.4 11/20/2022 1333   MCV 87.2 01/03/2017 1052   MCH 26.9 11/20/2022 1333   MCHC 32.2 11/20/2022 1333   RDW 16.3 (H) 11/20/2022 1333   RDW 13.8 01/03/2017 1052   LYMPHSABS 1.9 11/20/2022 1333   LYMPHSABS 1.3 01/03/2017 1052   MONOABS 1.0 11/20/2022 1333   MONOABS 0.7 01/03/2017 1052   EOSABS 0.4 11/20/2022 1333   EOSABS 0.3 01/03/2017 1052   BASOSABS 0.1 11/20/2022 1333    BASOSABS 0.1 01/03/2017 1052    CMP     Component Value Date/Time   NA 141 11/09/2022 1215   NA 140 01/03/2017 1052  K 5.1 11/09/2022 1215   K 4.5 01/03/2017 1052   CL 105 11/09/2022 1215   CO2 25 11/09/2022 1215   CO2 27 01/03/2017 1052   GLUCOSE 146 (H) 11/09/2022 1215   GLUCOSE 114 01/03/2017 1052   BUN 19 11/09/2022 1215   BUN 25.5 01/03/2017 1052   CREATININE 0.72 11/09/2022 1215   CREATININE 0.92 (H) 09/16/2020 1447   CREATININE 1.1 01/03/2017 1052   CALCIUM 10.7 (H) 11/09/2022 1215   CALCIUM 10.7 (H) 01/03/2017 1052   PROT 5.8 (L) 09/06/2022 1853   PROT 7.4 01/03/2017 1052   ALBUMIN 3.4 (L) 09/06/2022 1853   ALBUMIN 4.2 01/03/2017 1052   AST 30 09/06/2022 1853   AST 16 03/26/2018 1442   AST 20 01/03/2017 1052   ALT 20 09/06/2022 1853   ALT 10 03/26/2018 1442   ALT 17 01/03/2017 1052   ALKPHOS 45 09/06/2022 1853   ALKPHOS 60 01/03/2017 1052   BILITOT 0.7 09/06/2022 1853   BILITOT 0.2 (L) 03/26/2018 1442   BILITOT 0.47 01/03/2017 1052   GFRNONAA 51 (L) 10/15/2022 1721   GFRNONAA >60 03/26/2018 1442   GFRAA >60 03/26/2018 1442       PENDING LABS:   RADIOGRAPHIC STUDIES:  No results found.   PATHOLOGY:     ASSESSMENT and THERAPY PLAN:   No problem-specific Assessment & Plan notes found for this encounter.   No orders of the defined types were placed in this encounter.   All questions were answered. The patient knows to call the clinic with any problems, questions or concerns. We can certainly see the patient much sooner if necessary. This note was electronically signed. Noreene Filbert, NP 11/20/2022

## 2022-11-22 ENCOUNTER — Encounter: Payer: Self-pay | Admitting: Adult Health

## 2022-11-22 ENCOUNTER — Encounter: Payer: Self-pay | Admitting: Internal Medicine

## 2022-11-22 ENCOUNTER — Encounter: Payer: Self-pay | Admitting: Oncology

## 2022-11-22 NOTE — Assessment & Plan Note (Signed)
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  She is status post left breast lumpectomy, adjuvant chemotherapy, adjuvant radiation therapy, and antiestrogen therapy with tamoxifen that began in January 2018.  [name] has no clinical or radiographic sign of breast cancer recurrence.  She will continue on tamoxifen as she is tolerating it well.  She is overdue for her bilateral breast screening mammogram and I placed orders for this.  Her CBC demonstrates a normal hemoglobin.  Repeat upper endoscopy towards the end of August 2024.  We will see her back in 12 weeks for repeat labs and follow-up.

## 2022-11-27 ENCOUNTER — Encounter: Payer: Self-pay | Admitting: Internal Medicine

## 2022-12-06 ENCOUNTER — Encounter: Payer: Self-pay | Admitting: Internal Medicine

## 2022-12-06 ENCOUNTER — Ambulatory Visit: Payer: Medicare Other | Admitting: Internal Medicine

## 2022-12-06 VITALS — BP 142/69 | HR 85 | Temp 97.8°F | Ht 60.0 in | Wt 147.0 lb

## 2022-12-06 DIAGNOSIS — D509 Iron deficiency anemia, unspecified: Secondary | ICD-10-CM

## 2022-12-06 DIAGNOSIS — K250 Acute gastric ulcer with hemorrhage: Secondary | ICD-10-CM

## 2022-12-06 MED ORDER — SODIUM CHLORIDE 0.9 % IV SOLN
500.0000 mL | Freq: Once | INTRAVENOUS | Status: DC
Start: 2022-12-06 — End: 2023-02-20

## 2022-12-06 NOTE — Progress Notes (Signed)
Pt ate vegetable soup at 12:00 pm.  Will not be able to have EGD today.  Rescheduled for 9/16.  New instructions reviewed with patient and husband.

## 2022-12-11 DIAGNOSIS — M25562 Pain in left knee: Secondary | ICD-10-CM | POA: Diagnosis not present

## 2022-12-18 ENCOUNTER — Encounter: Payer: Self-pay | Admitting: Internal Medicine

## 2022-12-25 ENCOUNTER — Other Ambulatory Visit: Payer: Self-pay | Admitting: Pharmacist

## 2022-12-31 ENCOUNTER — Other Ambulatory Visit (INDEPENDENT_AMBULATORY_CARE_PROVIDER_SITE_OTHER): Payer: Medicare Other

## 2022-12-31 ENCOUNTER — Encounter: Payer: Self-pay | Admitting: Internal Medicine

## 2022-12-31 ENCOUNTER — Ambulatory Visit (AMBULATORY_SURGERY_CENTER): Payer: Medicare Other | Admitting: Internal Medicine

## 2022-12-31 VITALS — BP 122/52 | HR 85 | Temp 98.4°F | Resp 12 | Ht 60.0 in | Wt 147.0 lb

## 2022-12-31 DIAGNOSIS — K253 Acute gastric ulcer without hemorrhage or perforation: Secondary | ICD-10-CM

## 2022-12-31 LAB — CBC
HCT: 38.3 % (ref 36.0–46.0)
Hemoglobin: 12.5 g/dL (ref 12.0–15.0)
MCHC: 32.5 g/dL (ref 30.0–36.0)
MCV: 81.7 fl (ref 78.0–100.0)
Platelets: 210 10*3/uL (ref 150.0–400.0)
RBC: 4.69 Mil/uL (ref 3.87–5.11)
RDW: 17.7 % — ABNORMAL HIGH (ref 11.5–15.5)
WBC: 8.1 10*3/uL (ref 4.0–10.5)

## 2022-12-31 LAB — FERRITIN: Ferritin: 34.4 ng/mL (ref 10.0–291.0)

## 2022-12-31 LAB — IBC PANEL
Iron: 82 ug/dL (ref 42–145)
Saturation Ratios: 22.1 % (ref 20.0–50.0)
TIBC: 371 ug/dL (ref 250.0–450.0)
Transferrin: 265 mg/dL (ref 212.0–360.0)

## 2022-12-31 MED ORDER — SODIUM CHLORIDE 0.9 % IV SOLN: 500.0000 mL | Freq: Once | INTRAVENOUS | Status: DC

## 2022-12-31 NOTE — Patient Instructions (Addendum)
- Resume previous diet.                           - Continue present medications. Nexium 40 mg daily                            remains recommended.                           - In the event of future upper abdominal pain,                            nausea or vomiting, UGI series is recommended given                            anatomic alteration in setting of PUD.                           - Check ferritin, IBC panel and CBC today.   YOU HAD AN ENDOSCOPIC PROCEDURE TODAY AT THE [facility]:   Refer to the procedure report that was given to you for any specific questions about what was found during the examination.  If the procedure report does not answer your questions, please call your gastroenterologist to clarify.  If you requested that your care partner not be given the details of your procedure findings, then the procedure report has been included in a sealed envelope for you to review at your convenience later.  YOU SHOULD EXPECT: Some feelings of bloating in the abdomen. Passage of more gas than usual.  Walking can help get rid of the air that was put into your GI tract during the procedure and reduce the bloating. If you had a lower endoscopy (such as a colonoscopy or flexible sigmoidoscopy) you may notice spotting of blood in your stool or on the toilet paper. If you underwent a bowel prep for your procedure, you may not have a normal bowel movement for a few days.  Please Note:  You might notice some irritation and congestion in your nose or some drainage.  This is from the oxygen used during your procedure.  There is no need for concern and it should clear up in a day or so.  SYMPTOMS TO REPORT IMMEDIATELY:   Following upper endoscopy (EGD)  Vomiting of blood or coffee ground material  New chest pain or pain under the shoulder blades  Painful or persistently difficult swallowing  New shortness of breath  Fever of 100F or higher  Black,  tarry-looking stools  For urgent or emergent issues, a gastroenterologist can be reached at any hour by calling [phone]. Do not use MyChart messaging for urgent concerns.    DIET:  We do recommend a small meal at first, but then you may proceed to your regular diet.  Drink plenty of fluids but you should avoid alcoholic beverages for 24 hours.  ACTIVITY:  You should plan to take it easy for the rest of today and you should NOT DRIVE or use heavy machinery until tomorrow (because of the sedation medicines used during the test).    FOLLOW UP: Our staff will call the number listed on your records the next business day following your procedure.  We will call around 7:15-  8:00 am to check on you and address any questions or concerns that you may have regarding the information given to you following your procedure. If we do not reach you, we will leave a message.     If any biopsies were taken you will be contacted by phone or by letter within the next 1-3 weeks.  Please call us at [phone] if you have not heard about the biopsies in 3 weeks.    SIGNATURES/CONFIDENTIALITY: You and/or your care partner have signed paperwork which will be entered into your electronic medical record.  These signatures attest to the fact that that the information above on your After Visit Summary has been reviewed and is understood.  Full responsibility of the confidentiality of this discharge information lies with you and/or your care-partner.

## 2022-12-31 NOTE — Progress Notes (Signed)
I have reviewed the patient's medical history in detail and updated the computerized patient record.

## 2022-12-31 NOTE — Progress Notes (Signed)
GASTROENTEROLOGY PROCEDURE H&P NOTE   Primary Care Physician: Kristian Covey, MD    Reason for Procedure:   Gastric ulcer  Plan:    EGD  Patient is appropriate for endoscopic procedure(s) in the ambulatory ([facility]) setting.  The nature of the procedure, as well as the risks, benefits, and alternatives were carefully and thoroughly reviewed with the patient. Ample time for discussion and questions allowed. The patient understood, was satisfied, and agreed to proceed.     HPI: [name] is a 82 y.o. female who presents for EGD.  Medical history as below.  No recent chest pain or shortness of breath.  No abdominal pain today.  Past Medical History:  Diagnosis Date   Anemia    Anxiety    Arthritis    knees   Atrophic vaginitis    Cancer (HCC)    breast cancer - left   Diverticulosis    Endometriosis    Family history of breast cancer    Family history of colon cancer    GERD (gastroesophageal reflux disease)    Headache(784.0)    Heart murmur    as a child only   History of kidney stones    passed stones, no surgery required   History of radiation therapy 01/30/16- 03/02/16   Left Breast 50 Gy in 25 fractions.    Hyperlipidemia    Hypertension    Hypothyroidism    Internal hemorrhoids    Osteopenia    Osteoporosis    Personal history of chemotherapy    Personal history of radiation therapy    Rheumatic fever    age 46   Sleep apnea    " very mild" does not wear CPAP   Thyroid disease    Hypothyroid   Ulcer    UTI (lower urinary tract infection)     Past Surgical History:  Procedure Laterality Date   APPENDECTOMY     BIOPSY  08/23/2022   Procedure: BIOPSY;  Surgeon: Beverley Fiedler, MD;  Location: [facility] ENDOSCOPY;  Service: Gastroenterology;;   BREAST BIOPSY Left 08/19/2015   BREAST LUMPECTOMY Left 09/08/2015   BREAST LUMPECTOMY WITH RADIOACTIVE SEED AND SENTINEL LYMPH NODE BIOPSY Left 09/09/2015   Procedure: LEFT BREAST LUMPECTOMY WITH  RADIOACTIVE SEED AND SENTINEL LYMPH NODE BIOPSY;  Surgeon: Avel Peace, MD;  Location: [facility] OR;  Service: General;  Laterality: Left;   COLONOSCOPY     COLONOSCOPY N/A 08/23/2022   Procedure: COLONOSCOPY;  Surgeon: Beverley Fiedler, MD;  Location: [facility] ENDOSCOPY;  Service: Gastroenterology;  Laterality: N/A;   DILATATION & CURETTAGE/HYSTEROSCOPY WITH MYOSURE N/A 10/09/2016   Procedure: DILATATION & CURETTAGE/HYSTEROSCOPY WITH MYOSURE;  Surgeon: Genia Del, MD;  Location: [facility] ORS;  Service: Gynecology;  Laterality: N/A;  requesting 7:30am in our block  requests one hour   DILATATION & CURETTAGE/HYSTEROSCOPY WITH MYOSURE N/A 03/19/2022   Procedure: DILATATION & CURETTAGE/HYSTEROSCOPY;  Surgeon: Romualdo Bolk, MD;  Location: [facility] [place];  Service: Gynecology;  Laterality: N/A;   ESOPHAGOGASTRODUODENOSCOPY (EGD) WITH PROPOFOL N/A 08/23/2022   Procedure: ESOPHAGOGASTRODUODENOSCOPY (EGD) WITH PROPOFOL;  Surgeon: Beverley Fiedler, MD;  Location: [facility] ENDOSCOPY;  Service: Gastroenterology;  Laterality: N/A;   ESOPHAGOGASTRODUODENOSCOPY ENDOSCOPY     IR GENERIC HISTORICAL  12/05/2015   IR CV LINE INJECTION 12/05/2015 Jolaine Click, MD [facility]-INTER[facility] RAD   LAPAROSCOPIC ENDOMETRIOSIS FULGURATION  1978   PELVIC LAPAROSCOPY     PORTACATH PLACEMENT N/A 10/04/2015   Procedure: INSERTION PORT-A-CATH WITH ULTRASOUND;  Surgeon: Avel Peace, MD;  Location: [facility] OR;  Service: General;  Laterality: N/A;   portacath removal     TONSILLECTOMY     ULNAR NERVE REPAIR  2010    Prior to Admission medications   Medication Sig Start Date End Date Taking? Authorizing Provider  amLODipine (NORVASC) 5 MG tablet Take 1 tablet (5 mg total) by mouth every morning. 11/09/22  Yes Burchette, Elberta Fortis, MD  atorvastatin (LIPITOR) 20 MG tablet Take 1 tablet (20 mg total) by mouth daily. 11/09/22  Yes Burchette, Elberta Fortis, MD  esomeprazole (NEXIUM) 40 MG capsule Take 1 capsule (40 mg total) by mouth daily. 11/09/22  Yes  Burchette, Elberta Fortis, MD  famotidine (PEPCID) 40 MG tablet Take 40 mg by mouth daily before supper.   Yes [provider]  furosemide (LASIX) 20 MG tablet Take 1 tablet (20 mg total) by mouth every morning. 11/09/22  Yes Burchette, Elberta Fortis, MD  levothyroxine (SYNTHROID) 50 MCG tablet Take 1 tablet (50 mcg total) by mouth daily before breakfast. 11/09/22  Yes Burchette, Elberta Fortis, MD  Melatonin 3 MG TBDP Take 3 mg by mouth at bedtime. 01/20/18  Yes [provider]  metoprolol tartrate (LOPRESSOR) 25 MG tablet Take 1 tablet (25 mg total) by mouth in the morning and at bedtime. 11/09/22  Yes Burchette, Elberta Fortis, MD  Multiple Vitamins-Minerals (WOMENS MULTIVITAMIN) TABS Take 1 tablet by mouth at bedtime.   Yes [provider]  tamoxifen (NOLVADEX) 20 MG tablet TAKE ONE TABLET BY MOUTH EVERY MORNING 10/11/22  Yes Iruku, Burnice Logan, MD  donepezil (ARICEPT) 5 MG tablet Take 1 tablet (5 mg total) by mouth at bedtime. 09/20/22   Windell Norfolk, MD  HYDROcodone-acetaminophen (NORCO/VICODIN) 5-325 MG tablet Take 1 tablet by mouth every 6 (six) hours as needed. 12/11/22   [provider]  losartan-hydrochlorothiazide (HYZAAR) 100-12.5 MG tablet Take 1 tablet by mouth every morning. 11/09/22   Burchette, Elberta Fortis, MD  potassium chloride (KLOR-CON 10) 10 MEQ tablet Take 1 tablet (10 mEq total) by mouth 2 (two) times daily. 11/09/22   Burchette, Elberta Fortis, MD  TUMS 500 MG chewable tablet Chew 1-2 tablets by mouth daily as needed for indigestion or heartburn. Patient not taking: Reported on 12/31/2022    [provider]  TYLENOL 325 MG tablet Take 325-650 mg by mouth every 6 (six) hours as needed for mild pain or headache.    [provider]    Current Outpatient Medications  Medication Sig Dispense Refill   amLODipine (NORVASC) 5 MG tablet Take 1 tablet (5 mg total) by mouth every morning. 90 tablet 3   atorvastatin (LIPITOR) 20 MG tablet Take 1 tablet (20 mg total) by mouth  daily. 90 tablet 3   esomeprazole (NEXIUM) 40 MG capsule Take 1 capsule (40 mg total) by mouth daily. 90 capsule 1   famotidine (PEPCID) 40 MG tablet Take 40 mg by mouth daily before supper.     furosemide (LASIX) 20 MG tablet Take 1 tablet (20 mg total) by mouth every morning. 90 tablet 3   levothyroxine (SYNTHROID) 50 MCG tablet Take 1 tablet (50 mcg total) by mouth daily before breakfast. 90 tablet 3   Melatonin 3 MG TBDP Take 3 mg by mouth at bedtime.     metoprolol tartrate (LOPRESSOR) 25 MG tablet Take 1 tablet (25 mg total) by mouth in the morning and at bedtime. 180 tablet 3   Multiple Vitamins-Minerals (WOMENS MULTIVITAMIN) TABS Take 1 tablet by mouth at bedtime.     tamoxifen (  NOLVADEX) 20 MG tablet TAKE ONE TABLET BY MOUTH EVERY MORNING 30 tablet 2   donepezil (ARICEPT) 5 MG tablet Take 1 tablet (5 mg total) by mouth at bedtime. 30 tablet 6   HYDROcodone-acetaminophen (NORCO/VICODIN) 5-325 MG tablet Take 1 tablet by mouth every 6 (six) hours as needed.     losartan-hydrochlorothiazide (HYZAAR) 100-12.5 MG tablet Take 1 tablet by mouth every morning. 90 tablet 3   potassium chloride (KLOR-CON 10) 10 MEQ tablet Take 1 tablet (10 mEq total) by mouth 2 (two) times daily. 180 tablet 3   TUMS 500 MG chewable tablet Chew 1-2 tablets by mouth daily as needed for indigestion or heartburn. (Patient not taking: Reported on 12/31/2022)     TYLENOL 325 MG tablet Take 325-650 mg by mouth every 6 (six) hours as needed for mild pain or headache.     Current Facility-Administered Medications  Medication Dose Route Frequency Provider Last Rate Last Admin   0.9 %  sodium chloride infusion  500 mL Intravenous Once Tniyah Nakagawa, Carie Caddy, MD       0.9 %  sodium chloride infusion  500 mL Intravenous Once Zitlaly Malson, Carie Caddy, MD       betamethasone acetate-betamethasone sodium phosphate (CELESTONE) injection 12 mg  12 mg Other Once Tyrell Antonio, MD        Allergies as of 12/31/2022 - Review Complete 12/31/2022   Allergen Reaction Noted   Ferrlecit [na ferric gluc cplx in sucrose] Shortness Of Breath and Other (See Comments) 09/06/2022   Nitrofurantoin Nausea And Vomiting and Other (See Comments)    Sulfamethoxazole-trimethoprim Nausea And Vomiting and Other (See Comments)     Family History  Problem Relation Age of Onset   Breast cancer Mother        Age 42   Hypertension Mother    Heart disease Father    Stroke Father    Colon cancer Father        dx 26s   Breast cancer Paternal Grandmother        Age 1   Diabetes Paternal Grandmother    Heart disease Maternal Grandmother     Social History   Socioeconomic History   Marital status: Married    Spouse name: Not on file   Number of children: 0   Years of education: Not on file   Highest education level: Not on file  Occupational History   Occupation: retired  Tobacco Use   Smoking status: Never   Smokeless tobacco: Never  Vaping Use   Vaping status: Never Used  Substance and Sexual Activity   Alcohol use: No   Drug use: No   Sexual activity: Not Currently    Birth control/protection: Post-menopausal  Other Topics Concern   Not on file  Social History Narrative   Retired Midwife.    Social Determinants of Health   Financial Resource Strain: Low Risk  (05/22/2022)   Overall Financial Resource Strain (CARDIA)    Difficulty of Paying Living Expenses: Not very hard  Food Insecurity: No Food Insecurity (10/22/2022)   Hunger Vital Sign    Worried About Running Out of Food in the Last Year: Never true    Ran Out of Food in the Last Year: Never true  Transportation Needs: No Transportation Needs (10/22/2022)   PRAPARE - Administrator, Civil Service (Medical): No    Lack of Transportation (Non-Medical): No  Physical Activity: Insufficiently Active (03/17/2021)   Exercise Vital Sign    Days of Exercise  per Week: 3 days    Minutes of Exercise per Session: 10 min  Stress: No Stress Concern Present  (05/22/2022)   [employer] of Occupational Health - Occupational Stress Questionnaire    Feeling of Stress : Not at all  Social Connections: Socially Integrated (05/22/2022)   Social Connection and Isolation Panel [NHANES]    Frequency of Communication with Friends and Family: More than three times a week    Frequency of Social Gatherings with Friends and Family: More than three times a week    Attends Religious Services: More than 4 times per year    Active Member of [employer] or Organizations: Yes    Attends Engineer, structural: More than 4 times per year    Marital Status: Married  Catering manager Violence: Not At Risk (03/17/2021)   Humiliation, Afraid, Rape, and Kick questionnaire    Fear of Current or Ex-Partner: No    Emotionally Abused: No    Physically Abused: No    Sexually Abused: No    Physical Exam: Vital signs in last 24 hours: @BP  (!) 119/54   Pulse 75   Temp 98.4 F (36.9 C)   Ht 5' (1.524 m)   Wt 147 lb (66.7 kg)   SpO2 98%   BMI 28.71 kg/m  GEN: NAD EYE: Sclerae anicteric ENT: MMM CV: Non-tachycardic Pulm: CTA b/l GI: Soft, NT/ND NEURO:  Alert & Oriented x 3   Erick Blinks, MD [facility]  12/31/2022 11:48 AM

## 2022-12-31 NOTE — Progress Notes (Signed)
Report given to PACU, vss 

## 2022-12-31 NOTE — Progress Notes (Signed)
1149 Robinul 0.1 mg IV given due large amount of secretions upon assessment.  MD made aware, vss

## 2022-12-31 NOTE — Op Note (Signed)
[facility] Patient Name: [name] Procedure Date: 12/31/2022 11:47 AM MRN: [id number] Endoscopist: Beverley Fiedler , MD, [id number] Age: 82 Referring MD:  Date of Birth: 11-Dec-1940 Gender: Female Account #: [id number] Procedure:                Upper GI endoscopy Indications:              Follow-up of acute peptic ulcer with associated IDA                            as found in May 2024 at EGD (H. Pylori and                            dysplasia negative at that time) Medicines:                Monitored Anesthesia Care Procedure:                Pre-Anesthesia Assessment:                           - Prior to the procedure, a History and Physical                            was performed, and patient medications and                            allergies were reviewed. The patient's tolerance of                            previous anesthesia was also reviewed. The risks                            and benefits of the procedure and the sedation                            options and risks were discussed with the patient.                            All questions were answered, and informed consent                            was obtained. Prior Anticoagulants: The patient has                            taken no anticoagulant or antiplatelet agents. ASA                            Grade Assessment: II - A patient with mild systemic                            disease. After reviewing the risks and benefits,                            the patient was deemed in satisfactory condition to  undergo the procedure.                           After obtaining informed consent, the endoscope was                            passed under direct vision. Throughout the                            procedure, the patient's blood pressure, pulse, and                            oxygen saturations were monitored continuously. The                            GIF [id number] #[id number] was  introduced through the                            mouth, and advanced to the second part of duodenum.                            The upper GI endoscopy was accomplished without                            difficulty. The patient tolerated the procedure                            well. Scope In: Scope Out: Findings:                 The examined esophagus was normal.                           A small scar from healed ulcer was found in the                            prepyloric region of the stomach. Adjacent mucosa                            appears normal.                           A post-ulcer deformity was found in the duodenal                            bulb. 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 The opening to the 2nd duodenal                            is adjacent to this pouch.                           The second portion of the duodenum was normal. Complications:  No immediate complications. Estimated Blood Loss:     Estimated blood loss: none. Impression:               - Normal esophagus.                           - Scar in the prepyloric region of the stomach.                            Gastric ulcer has healed.                           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 The pouch causes mild narrowing at the D1                            to D2 junction.                           - Normal second portion of the duodenum.                           - No specimens collected. Recommendation:           - Patient has a contact number available for                            emergencies. The signs and symptoms of potential                            delayed complications were discussed with the                            patient. Return to normal activities tomorrow.                            Written  discharge instructions were provided to the                            patient.                           - Resume previous diet.                           - Continue present medications. Nexium 40 mg daily                            remains recommended.                           - In the event  of future upper abdominal pain,                            nausea or vomiting, UGI series is recommended given                            anatomic alteration in setting of PUD.                           - Check ferritin, IBC panel and CBC today. Beverley Fiedler, MD 12/31/2022 12:08:53 PM This report has been signed electronically.

## 2023-01-01 ENCOUNTER — Telehealth: Payer: Self-pay | Admitting: Adult Health

## 2023-01-01 ENCOUNTER — Telehealth: Payer: Self-pay

## 2023-01-01 NOTE — Telephone Encounter (Signed)
  Follow up Call-     12/31/2022   10:57 AM 12/06/2022    1:48 PM  Call back number  Post procedure Call Back phone  # [phone]  Permission to leave phone message Yes Yes     Patient questions:  Do you have a fever, pain , or abdominal swelling? No. Pain Score  0 *  Have you tolerated food without any problems? Yes.    Have you been able to return to your normal activities? Yes.    Do you have any questions about your discharge instructions: Diet   No. Medications  No. Follow up visit  No.  Do you have questions or concerns about your Care? No.  Actions: * If pain score is 4 or above: No action needed, pain <4.

## 2023-01-01 NOTE — Telephone Encounter (Signed)
Patient is aware of rescheduled appointment times/dates 

## 2023-01-21 DIAGNOSIS — D225 Melanocytic nevi of trunk: Secondary | ICD-10-CM | POA: Diagnosis not present

## 2023-01-21 DIAGNOSIS — D492 Neoplasm of unspecified behavior of bone, soft tissue, and skin: Secondary | ICD-10-CM | POA: Diagnosis not present

## 2023-01-21 DIAGNOSIS — L821 Other seborrheic keratosis: Secondary | ICD-10-CM | POA: Diagnosis not present

## 2023-01-21 DIAGNOSIS — D0461 Carcinoma in situ of skin of right upper limb, including shoulder: Secondary | ICD-10-CM | POA: Diagnosis not present

## 2023-01-21 DIAGNOSIS — L814 Other melanin hyperpigmentation: Secondary | ICD-10-CM | POA: Diagnosis not present

## 2023-01-30 ENCOUNTER — Ambulatory Visit
Admission: RE | Admit: 2023-01-30 | Discharge: 2023-01-30 | Disposition: A | Discharge status: Home / Self Care | Payer: Medicare Other | Source: Ambulatory Visit | Attending: Adult Health | Admitting: Adult Health

## 2023-01-30 DIAGNOSIS — Z1231 Encounter for screening mammogram for malignant neoplasm of breast: Secondary | ICD-10-CM | POA: Diagnosis not present

## 2023-02-01 LAB — HM MAMMOGRAPHY

## 2023-02-04 ENCOUNTER — Other Ambulatory Visit: Payer: Self-pay | Admitting: Adult Health

## 2023-02-04 ENCOUNTER — Telehealth: Payer: Self-pay | Admitting: Family Medicine

## 2023-02-04 ENCOUNTER — Encounter: Payer: Self-pay | Admitting: Family Medicine

## 2023-02-04 ENCOUNTER — Encounter: Payer: Self-pay | Admitting: Adult Health

## 2023-02-04 DIAGNOSIS — R928 Other abnormal and inconclusive findings on diagnostic imaging of breast: Secondary | ICD-10-CM

## 2023-02-04 NOTE — Telephone Encounter (Signed)
Patient's husband informed of the message and voiced understanding

## 2023-02-04 NOTE — Telephone Encounter (Signed)
Patient husband [name] came in regarding [name]'s mammogram, pt was not informed as of what to do.  Please advise at [phone]

## 2023-02-04 NOTE — Telephone Encounter (Signed)
Left a message for the patient to return my call.  °

## 2023-02-13 ENCOUNTER — Other Ambulatory Visit: Payer: Medicare Other

## 2023-02-13 ENCOUNTER — Ambulatory Visit: Payer: Medicare Other | Admitting: Adult Health

## 2023-02-14 ENCOUNTER — Other Ambulatory Visit: Payer: Self-pay | Admitting: *Deleted

## 2023-02-14 MED ORDER — TAMOXIFEN CITRATE 20 MG PO TABS: 20.0000 mg | ORAL_TABLET | Freq: Every morning | ORAL | 2 refills | Status: DC

## 2023-02-20 ENCOUNTER — Inpatient Hospital Stay: Payer: Medicare Other | Attending: Hematology and Oncology

## 2023-02-20 ENCOUNTER — Inpatient Hospital Stay (HOSPITAL_BASED_OUTPATIENT_CLINIC_OR_DEPARTMENT_OTHER): Payer: Medicare Other | Admitting: Adult Health

## 2023-02-20 VITALS — BP 123/47 | HR 75 | Temp 97.3°F | Resp 18 | Wt 163.8 lb

## 2023-02-20 DIAGNOSIS — C50412 Malignant neoplasm of upper-outer quadrant of left female breast: Secondary | ICD-10-CM | POA: Diagnosis not present

## 2023-02-20 DIAGNOSIS — Z170 Estrogen receptor positive status [ER+]: Secondary | ICD-10-CM | POA: Diagnosis not present

## 2023-02-20 DIAGNOSIS — Z9221 Personal history of antineoplastic chemotherapy: Secondary | ICD-10-CM | POA: Insufficient documentation

## 2023-02-20 DIAGNOSIS — D509 Iron deficiency anemia, unspecified: Secondary | ICD-10-CM | POA: Diagnosis not present

## 2023-02-20 DIAGNOSIS — Z79810 Long term (current) use of selective estrogen receptor modulators (SERMs): Secondary | ICD-10-CM | POA: Diagnosis not present

## 2023-02-20 DIAGNOSIS — Z923 Personal history of irradiation: Secondary | ICD-10-CM | POA: Insufficient documentation

## 2023-02-20 LAB — CBC WITH DIFFERENTIAL/PLATELET
Abs Immature Granulocytes: 0.04 10*3/uL (ref 0.00–0.07)
Basophils Absolute: 0.1 10*3/uL (ref 0.0–0.1)
Basophils Relative: 1 %
Eosinophils Absolute: 0.6 10*3/uL — ABNORMAL HIGH (ref 0.0–0.5)
Eosinophils Relative: 7 %
HCT: 37.1 % (ref 36.0–46.0)
Hemoglobin: 12.5 g/dL (ref 12.0–15.0)
Immature Granulocytes: 1 %
Lymphocytes Relative: 21 %
Lymphs Abs: 1.7 10*3/uL (ref 0.7–4.0)
MCH: 28.9 pg (ref 26.0–34.0)
MCHC: 33.7 g/dL (ref 30.0–36.0)
MCV: 85.9 fL (ref 80.0–100.0)
Monocytes Absolute: 1 10*3/uL (ref 0.1–1.0)
Monocytes Relative: 13 %
Neutro Abs: 4.5 10*3/uL (ref 1.7–7.7)
Neutrophils Relative %: 57 %
Platelets: 244 10*3/uL (ref 150–400)
RBC: 4.32 MIL/uL (ref 3.87–5.11)
RDW: 15.3 % (ref 11.5–15.5)
WBC: 7.9 10*3/uL (ref 4.0–10.5)
nRBC: 0 % (ref 0.0–0.2)

## 2023-02-20 NOTE — Progress Notes (Unsigned)
[facility] Cancer Follow up:    Kristian Covey, MD [address] [place] [address]   DIAGNOSIS:  Cancer Staging  Malignant neoplasm of upper-outer quadrant of left breast in female, estrogen receptor positive (HCC) Staging form: Breast, AJCC 7th Edition - Clinical: Stage IA (T1b, N0, M0) - Signed by Lonie Peak, MD on 09/07/2015 Laterality: Left - Pathologic: Stage IA (T1b, N0, cM0) - Unsigned   SUMMARY OF ONCOLOGIC HISTORY: Oncology History  Malignant neoplasm of upper-outer quadrant of left breast in female, estrogen receptor positive (HCC)  08/19/2015 Initial Biopsy   Left core biopsy 2:30 o'clock: IDC, ER+(100%), PR+(100%), Ki-67 10%, HER-2 negative (ratio 1.23).    09/07/2015 Initial Diagnosis   Malignant neoplasm of upper-outer quadrant of left breast in female, estrogen receptor positive (HCC)   09/09/2015 Surgery   Left breast lumpectomy ([facility]): IDC, DCIS, grade 1, 0.9 cm, margins negative, 2 SLN negative, T1b, N0   09/09/2015 Oncotype testing   29/19% intermediate risk   09/19/2015 Genetic Testing   genetics testing through the Breast/Ovarian gene panel offered by [facility] found no deleterious mutations in ATM, BARD1, BRCA1, BRCA2, BRIP1, CDH1, CHEK2, EPCAM, FANCC, MLH1, MSH2, MSH6, NBN, PALB2, PMS2, PTEN, RAD51C, RAD51D, TP53, and XRCC2.   10/24/2015 - 12/26/2015 Adjuvant Chemotherapy   Docetaxel and Cyclophosphamide x 4 cycles   01/30/2016 - 03/02/2016 Radiation Therapy   Adjuvant radiation [facility]): Left breast: 50 Gy in 25 fractions   04/2016 -  Anti-estrogen oral therapy   Tamoxifen 20mg  daily     CURRENT THERAPY: Tamoxifen  INTERVAL HISTORY:  Discussed the use of AI scribe software for clinical note transcription with the patient, who gave verbal consent to proceed.  [name] 82 y.o. female with a history of breast cancer and iron deficiency anemia, presents for a follow-up visit. She is currently on tamoxifen for  breast cancer and iron supplementation for anemia. The patient underwent an upper endoscopy in May 2024, which revealed an ulcer. She received two doses of IV iron in late May 2024. A repeat endoscopy in September 2024 showed a healed ulcer, and her iron levels have since improved.  She continues on Nexium daily with good tolerance.  The patient recently had a screening mammogram that showed asymmetry in the left breast, necessitating a diagnostic ultrasound scheduled for November 14th. She expresses frustration with the delay in scheduling the diagnostic procedure.  The patient's medication regimen includes amlodipine, donezepil (Aricept), and esomeprazole (Nexium). She also takes an iron tablet but does not take a multivitamin. The patient reports no adverse effects from the iron tablet.  The patient's partner assists with medication administration and reports doing so diligently.   Patient Active Problem List   Diagnosis Date Noted   Ankle edema, bilateral 10/09/2022   Acute gastric ulcer without hemorrhage or perforation 08/23/2022   GIB (gastrointestinal bleeding) 08/22/2022   Iron deficiency anemia due to chronic blood loss 08/22/2022   Heme positive stool 08/22/2022   GI bleed 08/21/2022   Acute blood loss anemia 08/21/2022   RUQ pain 08/21/2022   Hypokalemia 08/21/2022   Chronic kidney disease, stage III (moderate) (HCC) 08/21/2022   History of breast cancer 08/21/2022   Short-term memory loss 08/21/2022   Cognitive impairment 10/04/2021   (HFpEF) heart failure with preserved ejection fraction (HCC) 10/25/2020   Fusion of spine of thoracolumbar region 02/21/2018   Chronic midline low back pain with bilateral sciatica 11/06/2016   Genetic testing 09/23/2015   Malignant neoplasm of  upper-outer quadrant of left breast in female, estrogen receptor positive (HCC) 09/07/2015   Family history of breast cancer    Family history of colon cancer    Prediabetes 08/30/2014   Endometriosis     Osteopenia    Hypertension    Lower urinary tract infectious disease 03/05/2011   Dysuria 03/05/2011   Hypothyroidism 06/06/2009   FATIGUE 06/06/2009   ELEVATED BLOOD PRESSURE 06/06/2009   Disorder of bone and cartilage 09/09/2008   SCOLIOSIS 09/09/2008   Hyperlipidemia 07/30/2008   Migraine headache 07/30/2008   GERD 07/30/2008   ENTHESOPATHY OF HIP REGION 07/30/2008   HEADACHE 07/30/2008    is allergic to ferrlecit [na ferric gluc cplx in sucrose], nitrofurantoin, and sulfamethoxazole-trimethoprim.  MEDICAL HISTORY: Past Medical History:  Diagnosis Date   Anemia    Anxiety    Arthritis    knees   Atrophic vaginitis    Cancer (HCC)    breast cancer - left   Diverticulosis    Endometriosis    Family history of breast cancer    Family history of colon cancer    GERD (gastroesophageal reflux disease)    Headache(784.0)    Heart murmur    as a child only   History of kidney stones    passed stones, no surgery required   History of radiation therapy 01/30/16- 03/02/16   Left Breast 50 Gy in 25 fractions.    Hyperlipidemia    Hypertension    Hypothyroidism    Internal hemorrhoids    Osteopenia    Osteoporosis    Personal history of chemotherapy    Personal history of radiation therapy    Rheumatic fever    age 14   Sleep apnea    " very mild" does not wear CPAP   Thyroid disease    Hypothyroid   Ulcer    UTI (lower urinary tract infection)     SURGICAL HISTORY: Past Surgical History:  Procedure Laterality Date   APPENDECTOMY     BIOPSY  08/23/2022   Procedure: BIOPSY;  Surgeon: Beverley Fiedler, MD;  Location: [facility] ENDOSCOPY;  Service: Gastroenterology;;   BREAST BIOPSY Left 08/19/2015   BREAST LUMPECTOMY Left 09/08/2015   BREAST LUMPECTOMY WITH RADIOACTIVE SEED AND SENTINEL LYMPH NODE BIOPSY Left 09/09/2015   Procedure: LEFT BREAST LUMPECTOMY WITH RADIOACTIVE SEED AND SENTINEL LYMPH NODE BIOPSY;  Surgeon: Avel Peace, MD;  Location: [facility] OR;  Service:  General;  Laterality: Left;   COLONOSCOPY     COLONOSCOPY N/A 08/23/2022   Procedure: COLONOSCOPY;  Surgeon: Beverley Fiedler, MD;  Location: [facility] ENDOSCOPY;  Service: Gastroenterology;  Laterality: N/A;   DILATATION & CURETTAGE/HYSTEROSCOPY WITH MYOSURE N/A 10/09/2016   Procedure: DILATATION & CURETTAGE/HYSTEROSCOPY WITH MYOSURE;  Surgeon: Genia Del, MD;  Location: [facility] ORS;  Service: Gynecology;  Laterality: N/A;  requesting 7:30am in our block  requests one hour   DILATATION & CURETTAGE/HYSTEROSCOPY WITH MYOSURE N/A 03/19/2022   Procedure: DILATATION & CURETTAGE/HYSTEROSCOPY;  Surgeon: Romualdo Bolk, MD;  Location: [facility];  Service: Gynecology;  Laterality: N/A;   ESOPHAGOGASTRODUODENOSCOPY (EGD) WITH PROPOFOL N/A 08/23/2022   Procedure: ESOPHAGOGASTRODUODENOSCOPY (EGD) WITH PROPOFOL;  Surgeon: Beverley Fiedler, MD;  Location: [facility] ENDOSCOPY;  Service: Gastroenterology;  Laterality: N/A;   ESOPHAGOGASTRODUODENOSCOPY ENDOSCOPY     IR GENERIC HISTORICAL  12/05/2015   IR CV LINE INJECTION 12/05/2015 Jolaine Click, MD [facility]-INTERV RAD   LAPAROSCOPIC ENDOMETRIOSIS FULGURATION  1978   PELVIC LAPAROSCOPY     PORTACATH PLACEMENT N/A 10/04/2015  Procedure: INSERTION PORT-A-CATH WITH ULTRASOUND;  Surgeon: Avel Peace, MD;  Location: [facility] OR;  Service: General;  Laterality: N/A;   portacath removal     TONSILLECTOMY     ULNAR NERVE REPAIR  2010    SOCIAL HISTORY: Social History   Socioeconomic History   Marital status: Married    Spouse name: Not on file   Number of children: 0   Years of education: Not on file   Highest education level: Not on file  Occupational History   Occupation: retired  Tobacco Use   Smoking status: Never   Smokeless tobacco: Never  Vaping Use   Vaping status: Never Used  Substance and Sexual Activity   Alcohol use: No   Drug use: No   Sexual activity: Not Currently    Birth control/protection: Post-menopausal  Other Topics Concern   Not on  file  Social History Narrative   Retired Midwife.    Social Determinants of Health   Financial Resource Strain: Low Risk  (05/22/2022)   Overall Financial Resource Strain (CARDIA)    Difficulty of Paying Living Expenses: Not very hard  Food Insecurity: No Food Insecurity (10/22/2022)   Hunger Vital Sign    Worried About Running Out of Food in the Last Year: Never true    Ran Out of Food in the Last Year: Never true  Transportation Needs: No Transportation Needs (10/22/2022)   PRAPARE - Administrator, Civil Service (Medical): No    Lack of Transportation (Non-Medical): No  Physical Activity: Insufficiently Active (03/17/2021)   Exercise Vital Sign    Days of Exercise per Week: 3 days    Minutes of Exercise per Session: 10 min  Stress: No Stress Concern Present (05/22/2022)   [employer] of Occupational Health - Occupational Stress Questionnaire    Feeling of Stress : Not at all  Social Connections: Socially Integrated (05/22/2022)   Social Connection and Isolation Panel [NHANES]    Frequency of Communication with Friends and Family: More than three times a week    Frequency of Social Gatherings with Friends and Family: More than three times a week    Attends Religious Services: More than 4 times per year    Active Member of [employer] or Organizations: Yes    Attends Engineer, structural: More than 4 times per year    Marital Status: Married  Catering manager Violence: Not At Risk (03/17/2021)   Humiliation, Afraid, Rape, and Kick questionnaire    Fear of Current or Ex-Partner: No    Emotionally Abused: No    Physically Abused: No    Sexually Abused: No    FAMILY HISTORY: Family History  Problem Relation Age of Onset   Breast cancer Mother        Age 19   Hypertension Mother    Heart disease Father    Stroke Father    Colon cancer Father        dx 4s   Breast cancer Paternal Grandmother        Age 10   Diabetes Paternal Grandmother    Heart  disease Maternal Grandmother     Review of Systems  Constitutional:  Negative for appetite change, chills, fatigue, fever and unexpected weight change.  HENT:   Negative for hearing loss, lump/mass and trouble swallowing.   Eyes:  Negative for eye problems and icterus.  Respiratory:  Negative for chest tightness, cough and shortness of breath.   Cardiovascular:  Negative for chest pain,  leg swelling and palpitations.  Gastrointestinal:  Negative for abdominal distention, abdominal pain, constipation, diarrhea, nausea and vomiting.  Endocrine: Negative for hot flashes.  Genitourinary:  Negative for difficulty urinating.   Musculoskeletal:  Negative for arthralgias.  Skin:  Negative for itching and rash.  Neurological:  Negative for dizziness, extremity weakness, headaches and numbness.  Hematological:  Negative for adenopathy. Does not bruise/bleed easily.  Psychiatric/Behavioral:  Negative for depression. The patient is not nervous/anxious.       PHYSICAL EXAMINATION    Vitals:   02/20/23 1101  BP: (!) 123/47  Pulse: 75  Resp: 18  Temp: (!) 97.3 F (36.3 C)  SpO2: 98%    Physical Exam Constitutional:      General: She is not in acute distress.    Appearance: Normal appearance. She is not toxic-appearing.  HENT:     Head: Normocephalic and atraumatic.     Mouth/Throat:     Mouth: Mucous membranes are moist.     Pharynx: Oropharynx is clear. No oropharyngeal exudate or posterior oropharyngeal erythema.  Eyes:     General: No scleral icterus. Cardiovascular:     Rate and Rhythm: Normal rate and regular rhythm.     Pulses: Normal pulses.     Heart sounds: Normal heart sounds.  Pulmonary:     Effort: Pulmonary effort is normal.     Breath sounds: Normal breath sounds.  Chest:     Comments: Right breast benign, left breast s/p lumepctomy and radiation, no sign of local recurrence.  Abdominal:     General: Abdomen is flat. Bowel sounds are normal. There is no  distension.     Palpations: Abdomen is soft.     Tenderness: There is no abdominal tenderness.  Musculoskeletal:        General: No swelling.     Cervical back: Neck supple.  Lymphadenopathy:     Cervical: No cervical adenopathy.     Upper Body:     Right upper body: No axillary adenopathy.     Left upper body: No axillary adenopathy.  Skin:    General: Skin is warm and dry.     Findings: No rash.  Neurological:     General: No focal deficit present.     Mental Status: She is alert.  Psychiatric:        Mood and Affect: Mood normal.        Behavior: Behavior normal.     LABORATORY DATA:  CBC    Component Value Date/Time   WBC 7.9 02/20/2023 1034   RBC 4.32 02/20/2023 1034   HGB 12.5 02/20/2023 1034   HGB 11.6 (L) 03/26/2018 1442   HGB 15.0 01/03/2017 1052   HCT 37.1 02/20/2023 1034   HCT 45.0 01/03/2017 1052   PLT 244 02/20/2023 1034   PLT 214 03/26/2018 1442   PLT 246 01/03/2017 1052   MCV 85.9 02/20/2023 1034   MCV 87.2 01/03/2017 1052   MCH 28.9 02/20/2023 1034   MCHC 33.7 02/20/2023 1034   RDW 15.3 02/20/2023 1034   RDW 13.8 01/03/2017 1052   LYMPHSABS 1.7 02/20/2023 1034   LYMPHSABS 1.3 01/03/2017 1052   MONOABS 1.0 02/20/2023 1034   MONOABS 0.7 01/03/2017 1052   EOSABS 0.6 (H) 02/20/2023 1034   EOSABS 0.3 01/03/2017 1052   BASOSABS 0.1 02/20/2023 1034   BASOSABS 0.1 01/03/2017 1052      ASSESSMENT and THERAPY PLAN:   Malignant neoplasm of upper-outer quadrant of left breast in female, estrogen receptor positive (  HCC) Harpreet is an 82 year old woman with 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  She is status post left breast lumpectomy, adjuvant chemotherapy, adjuvant radiation therapy, and antiestrogen therapy with tamoxifen that began in January 2018.  Breast Cancer On Tamoxifen treatment. Recent screening mammogram showed asymmetry in the left breast requiring further diagnostic imaging. -Scheduled for diagnostic  mammogram and possible ultrasound on November 14th, 2024. -Results will be communicated to the patient and further management will be coordinated with the breast center.  Iron Deficiency Anemia History of an ulcer which has since healed. Iron levels have improved after IV iron therapy. -Continue current iron supplementation. -Discontinue iron supplementation if causing any side effects such as nausea or constipation. -Iron levels from 12/2022 have normalized.  Medication Management Some confusion regarding medication names and usage. -Clarified that esomeprazole is Nexium and donezepil is Aricept. -Advised to continue current medications as prescribed. -Discontinue medications not currently needed.  Follow-up -Return in six months for labs and follow-up. -If needed, earlier follow-up will be scheduled by the breast center.  All questions were answered. The patient knows to call the clinic with any problems, questions or concerns. We can certainly see the patient much sooner if necessary.  Total encounter time:30 minutes*in face-to-face visit time, chart review, lab review, care coordination, order entry, and documentation of the encounter time.   Lillard Anes, NP 02/20/23 12:03 PM Medical Oncology and Hematology [facility] [address] [place], [place] [address] Tel. [phone]    Fax. [phone]  *Total Encounter Time as defined by the Centers for Medicare and Medicaid Services includes, in addition to the face-to-face time of a patient visit (documented in the note above) non-face-to-face time: obtaining and reviewing outside history, ordering and reviewing medications, tests or procedures, care coordination (communications with other health care professionals or caregivers) and documentation in the medical record.

## 2023-02-20 NOTE — Assessment & Plan Note (Addendum)
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  She is status post left breast lumpectomy, adjuvant chemotherapy, adjuvant radiation therapy, and antiestrogen therapy with tamoxifen that began in January 2018.  Breast Cancer On Tamoxifen treatment. Recent screening mammogram showed asymmetry in the left breast requiring further diagnostic imaging. -Scheduled for diagnostic mammogram and possible ultrasound on November 14th, 2024. -Results will be communicated to the patient and further management will be coordinated with the breast center.  Iron Deficiency Anemia History of an ulcer which has since healed. Iron levels have improved after IV iron therapy. -Continue current iron supplementation. -Discontinue iron supplementation if causing any side effects such as nausea or constipation. -Iron levels from 12/2022 have normalized.  Medication Management Some confusion regarding medication names and usage. -Clarified that esomeprazole is Nexium and donezepil is Aricept. -Advised to continue current medications as prescribed. -Discontinue medications not currently needed.  Follow-up -Return in six months for labs and follow-up. -If needed, earlier follow-up will be scheduled by the breast center.

## 2023-02-28 ENCOUNTER — Ambulatory Visit
Admission: RE | Admit: 2023-02-28 | Discharge: 2023-02-28 | Disposition: A | Discharge status: Home / Self Care | Payer: Medicare Other | Source: Ambulatory Visit | Attending: Adult Health | Admitting: Adult Health

## 2023-02-28 ENCOUNTER — Ambulatory Visit: Payer: Medicare Other

## 2023-02-28 DIAGNOSIS — R928 Other abnormal and inconclusive findings on diagnostic imaging of breast: Secondary | ICD-10-CM

## 2023-03-21 DIAGNOSIS — D0461 Carcinoma in situ of skin of right upper limb, including shoulder: Secondary | ICD-10-CM | POA: Diagnosis not present

## 2023-03-28 DIAGNOSIS — M1712 Unilateral primary osteoarthritis, left knee: Secondary | ICD-10-CM | POA: Diagnosis not present

## 2023-05-07 ENCOUNTER — Other Ambulatory Visit: Payer: Self-pay | Admitting: Hematology and Oncology

## 2023-05-17 ENCOUNTER — Other Ambulatory Visit: Payer: Self-pay

## 2023-05-17 NOTE — Patient Instructions (Signed)
Thank you for allowing the Care Management team to participate in your care. It was great speaking with you today!   Reminders: -Please monitor your blood pressure a few times a week if you are not able to check it daily. Please write down your readings and contact the clinic if your readings are too high or too low. -Our next outreach is scheduled for July 26, 2023 at 2pm.   Please do not hesitate to contact me if you have questions or require assistance prior to our next appointment.     Juanell Fairly [facility] Population Health RN Care Manager Direct Dial: [phone]  Fax: [phone] Website: [email or web address]

## 2023-05-17 NOTE — Patient Outreach (Signed)
Care Management   Visit Note  05/17/2023 Name: [name] MRN: [id number] DOB: 1940/10/17  Subjective: [name] is a 83 y.o. year old female who is a primary care patient of Burchette, Elberta Fortis, MD. The Care Management team was consulted for assistance.      Engaged with [name] via telephone.  Assessment:  Review of patient past medical history, allergies, medications, health status, including review of consultants reports, laboratory and other test data, was performed as part of  evaluation and provision of care management services.    Outpatient Encounter Medications as of 05/17/2023  Medication Sig   amLODipine (NORVASC) 5 MG tablet Take 1 tablet (5 mg total) by mouth every morning.   atorvastatin (LIPITOR) 20 MG tablet Take 1 tablet (20 mg total) by mouth daily.   donepezil (ARICEPT) 5 MG tablet Take 1 tablet (5 mg total) by mouth at bedtime.   esomeprazole (NEXIUM) 40 MG capsule Take 1 capsule (40 mg total) by mouth daily.   ferrous sulfate 324 MG TBEC Take 324 mg by mouth daily with breakfast.   furosemide (LASIX) 20 MG tablet Take 1 tablet (20 mg total) by mouth every morning.   levothyroxine (SYNTHROID) 50 MCG tablet Take 1 tablet (50 mcg total) by mouth daily before breakfast.   losartan-hydrochlorothiazide (HYZAAR) 100-12.5 MG tablet Take 1 tablet by mouth every morning.   metoprolol tartrate (LOPRESSOR) 25 MG tablet Take 1 tablet (25 mg total) by mouth in the morning and at bedtime.   potassium chloride (KLOR-CON 10) 10 MEQ tablet Take 1 tablet (10 mEq total) by mouth 2 (two) times daily.   tamoxifen (NOLVADEX) 20 MG tablet TAKE 1 TABLET BY MOUTH ONCE DAILY IN THE MORNING   TYLENOL 325 MG tablet Take 325-650 mg by mouth every 6 (six) hours as needed for mild pain or headache.   No facility-administered encounter medications on file as of 05/17/2023.    Interventions:   Goals Addressed             This Visit's Progress    Goal: Monitor,  Self-Manage And Reduce Symptoms of Hyperlipidemia       Current Barriers:  Care Management support and education needs related to Hyperlipidemia  Planned Interventions: Reviewed plan for management of cholesterol. Reviewed medications. Reports taking atorvastatin as prescribed. Advised to notify provider with concerns related to statin tolerance. Reviewed provider established cholesterol goals. Counseled on importance of regular laboratory monitoring as prescribed. Last recorded labs were noted in 2023. Will likely complete updated labs at next clinic visit.  Reviewed role and benefits of statin for ASCVD risk reduction. Reviewed strategies to manage statin-induced myalgias. Reports currently tolerating well Reviewed importance of limiting foods high in cholesterol. Advised to engage in low impact activities/exercises as tolerated.  Lab Results  Component Value Date   CHOL 154 08/02/2021   HDL 47.80 08/02/2021   LDLDIRECT 74.0 08/02/2021   TRIG 325.0 (H) 08/02/2021   CHOLHDL 3 08/02/2021     Symptom Management: Take medications as prescribed   Attend all scheduled provider appointments Call pharmacy for medication refills 3-7 days in advance of running out of medications Complete labs as ordered Call provider office for new concerns or questions  Call doctor with any symptoms you believe are related to your medicine   Follow Up Plan:  Will follow up in three months       Goal: Monitor, Self-Manage And Reduce Symptoms of Hypertension       Current Barriers:  Care Management support and education needs related to Hypertension.  Planned Interventions: Reviewed current treatment plan related to Hypertension, self-management, and adherence to plan as established by provider.  Reviewed medications and indications for use. Reports taking medications as prescribed. Reports her spouse is assisting with medication preparation. Denies current need for additional assistance with  medication management or prescription cost. Provided information regarding established blood pressure parameters along with indications for notifying a provider. Reports not monitoring blood pressure for several weeks. Agreed to start monitoring blood pressure a few times a week. Advised to record readings to identify trends. Reviewed symptoms. Denies chest pain or palpitations. Denies headaches, dizziness, or visual changes. Advised to assess symptoms daily as some of her diastolic readings run low. Reviewed compliance with recommended cardiac prudent diet. Encouraged to read nutrition labels, continue monitoring sodium intake, and avoid highly processed foods when possible.  Reviewed s/sx of heart attack, stroke and worsening symptoms that require immediate medical attention.  BP Readings from Last 3 Encounters:  02/20/23 (!) 123/47  12/31/22 (!) 122/52  12/06/22 (!) 142/69       Symptom Management: Take all medications as prescribed Attend all scheduled provider appointments Call pharmacy for medication refills 3-7 days in advance of running out of medications Monitor blood pressure a few times a week if unable to monitor daily Keep a blood pressure log Call doctor for signs and symptoms of high or low blood pressure Continue engaging in exercises and mild activity as tolerated Read nutrition labels. Eat whole grains, fruits and vegetables, lean meats and healthy fats Limit salt intake  Call provider office for new concerns or questions    Follow Up Plan:  Will follow up in three months         PLAN Will follow up in three months    Juanell Fairly [facility] RN Care Manager Direct Dial: [phone]  Fax: [phone] Website: [email or web address]

## 2023-05-24 ENCOUNTER — Other Ambulatory Visit: Payer: Self-pay | Admitting: Hematology and Oncology

## 2023-06-12 NOTE — H&P (Addendum)
 TOTAL KNEE ADMISSION H&P  Patient is being admitted for left total knee arthroplasty.  Subjective:  Chief Complaint: Left knee pain.  HPI: [name], 83 y.o. female has a history of pain and functional disability in the left knee due to arthritis and has failed non-surgical conservative treatments for greater than 12 weeks to include corticosteriod injections, viscosupplementation injections, and activity modification. Onset of symptoms was gradual, starting several years ago with gradually worsening course since that time. The patient noted no past surgery on the left knee.  Patient currently rates pain in the left knee at 8 out of 10 with activity. Patient has worsening of pain with activity and weight bearing and pain that interferes with activities of daily living. Patient has evidence of periarticular osteophytes and joint space narrowing by imaging studies. There is no active infection.  Patient Active Problem List   Diagnosis Date Noted   Ankle edema, bilateral 10/09/2022   Acute gastric ulcer without hemorrhage or perforation 08/23/2022   GIB (gastrointestinal bleeding) 08/22/2022   Iron deficiency anemia due to chronic blood loss 08/22/2022   Heme positive stool 08/22/2022   GI bleed 08/21/2022   Acute blood loss anemia 08/21/2022   RUQ pain 08/21/2022   Hypokalemia 08/21/2022   Chronic kidney disease, stage III (moderate) (HCC) 08/21/2022   History of breast cancer 08/21/2022   Short-term memory loss 08/21/2022   Cognitive impairment 10/04/2021   (HFpEF) heart failure with preserved ejection fraction (HCC) 10/25/2020   Fusion of spine of thoracolumbar region 02/21/2018   Chronic midline low back pain with bilateral sciatica 11/06/2016   Genetic testing 09/23/2015   Malignant neoplasm of upper-outer quadrant of left breast in female, estrogen receptor positive (HCC) 09/07/2015   Family history of breast cancer    Family history of colon cancer    Prediabetes  08/30/2014   Endometriosis    Osteopenia    Hypertension    Lower urinary tract infectious disease 03/05/2011   Dysuria 03/05/2011   Hypothyroidism 06/06/2009   FATIGUE 06/06/2009   ELEVATED BLOOD PRESSURE 06/06/2009   Disorder of bone and cartilage 09/09/2008   SCOLIOSIS 09/09/2008   Hyperlipidemia 07/30/2008   Migraine headache 07/30/2008   GERD 07/30/2008   ENTHESOPATHY OF HIP REGION 07/30/2008   HEADACHE 07/30/2008    Past Medical History:  Diagnosis Date   Anemia    Anxiety    Arthritis    knees   Atrophic vaginitis    Cancer (HCC)    breast cancer - left   Diverticulosis    Endometriosis    Family history of breast cancer    Family history of colon cancer    GERD (gastroesophageal reflux disease)    Headache(784.0)    Heart murmur    as a child only   History of kidney stones    passed stones, no surgery required   History of radiation therapy 01/30/16- 03/02/16   Left Breast 50 Gy in 25 fractions.    Hyperlipidemia    Hypertension    Hypothyroidism    Internal hemorrhoids    Osteopenia    Osteoporosis    Personal history of chemotherapy    Personal history of radiation therapy    Rheumatic fever    age 7   Sleep apnea    " very mild" does not wear CPAP   Thyroid disease    Hypothyroid   Ulcer    UTI (lower urinary tract infection)     Past Surgical History:  Procedure Laterality  Date   APPENDECTOMY     BIOPSY  08/23/2022   Procedure: BIOPSY;  Surgeon: Beverley Fiedler, MD;  Location: [facility] ENDOSCOPY;  Service: Gastroenterology;;   BREAST BIOPSY Left 08/19/2015   BREAST LUMPECTOMY Left 09/08/2015   BREAST LUMPECTOMY WITH RADIOACTIVE SEED AND SENTINEL LYMPH NODE BIOPSY Left 09/09/2015   Procedure: LEFT BREAST LUMPECTOMY WITH RADIOACTIVE SEED AND SENTINEL LYMPH NODE BIOPSY;  Surgeon: Avel Peace, MD;  Location: [facility] OR;  Service: General;  Laterality: Left;   COLONOSCOPY     COLONOSCOPY N/A 08/23/2022   Procedure: COLONOSCOPY;  Surgeon: Beverley Fiedler,  MD;  Location: [facility] ENDOSCOPY;  Service: Gastroenterology;  Laterality: N/A;   DILATATION & CURETTAGE/HYSTEROSCOPY WITH MYOSURE N/A 10/09/2016   Procedure: DILATATION & CURETTAGE/HYSTEROSCOPY WITH MYOSURE;  Surgeon: Genia Del, MD;  Location: [facility] ORS;  Service: Gynecology;  Laterality: N/A;  requesting 7:30am in our block  requests one hour   DILATATION & CURETTAGE/HYSTEROSCOPY WITH MYOSURE N/A 03/19/2022   Procedure: DILATATION & CURETTAGE/HYSTEROSCOPY;  Surgeon: Romualdo Bolk, MD;  Location: [facility];  Service: Gynecology;  Laterality: N/A;   ESOPHAGOGASTRODUODENOSCOPY (EGD) WITH PROPOFOL N/A 08/23/2022   Procedure: ESOPHAGOGASTRODUODENOSCOPY (EGD) WITH PROPOFOL;  Surgeon: Beverley Fiedler, MD;  Location: [facility] ENDOSCOPY;  Service: Gastroenterology;  Laterality: N/A;   ESOPHAGOGASTRODUODENOSCOPY ENDOSCOPY     IR GENERIC HISTORICAL  12/05/2015   IR CV LINE INJECTION 12/05/2015 Jolaine Click, MD [facility]-INTERV RAD   LAPAROSCOPIC ENDOMETRIOSIS FULGURATION  1978   PELVIC LAPAROSCOPY     PORTACATH PLACEMENT N/A 10/04/2015   Procedure: INSERTION PORT-A-CATH WITH ULTRASOUND;  Surgeon: Avel Peace, MD;  Location: [facility] OR;  Service: General;  Laterality: N/A;   portacath removal     TONSILLECTOMY     ULNAR NERVE REPAIR  2010    Prior to Admission medications   Medication Sig Start Date End Date Taking? Authorizing Provider  amLODipine (NORVASC) 5 MG tablet Take 1 tablet (5 mg total) by mouth every morning. 11/09/22   Burchette, Elberta Fortis, MD  atorvastatin (LIPITOR) 20 MG tablet Take 1 tablet (20 mg total) by mouth daily. 11/09/22   Burchette, Elberta Fortis, MD  donepezil (ARICEPT) 5 MG tablet Take 1 tablet (5 mg total) by mouth at bedtime. 09/20/22   Windell Norfolk, MD  esomeprazole (NEXIUM) 40 MG capsule Take 1 capsule (40 mg total) by mouth daily. 11/09/22   Burchette, Elberta Fortis, MD  ferrous sulfate 324 MG TBEC Take 324 mg by mouth daily with breakfast.    [provider]  furosemide  (LASIX) 20 MG tablet Take 1 tablet (20 mg total) by mouth every morning. 11/09/22   Burchette, Elberta Fortis, MD  levothyroxine (SYNTHROID) 50 MCG tablet Take 1 tablet (50 mcg total) by mouth daily before breakfast. 11/09/22   Burchette, Elberta Fortis, MD  losartan-hydrochlorothiazide (HYZAAR) 100-12.5 MG tablet Take 1 tablet by mouth every morning. 11/09/22   Burchette, Elberta Fortis, MD  metoprolol tartrate (LOPRESSOR) 25 MG tablet Take 1 tablet (25 mg total) by mouth in the morning and at bedtime. 11/09/22   Burchette, Elberta Fortis, MD  potassium chloride (KLOR-CON 10) 10 MEQ tablet Take 1 tablet (10 mEq total) by mouth 2 (two) times daily. 11/09/22   Burchette, Elberta Fortis, MD  tamoxifen (NOLVADEX) 20 MG tablet TAKE 1 TABLET BY MOUTH ONCE DAILY IN THE MORNING 05/24/23   Rachel Moulds, MD  TYLENOL 325 MG tablet Take 325-650 mg by mouth every 6 (six) hours as needed for mild pain or headache.  [provider]    Allergies  Allergen Reactions   Ferrlecit [Na Ferric Gluc Cplx In Sucrose] Shortness Of Breath and Other (See Comments)    Made the hands and feet feel odd, also   Nitrofurantoin Nausea And Vomiting and Other (See Comments)    GI upset   Sulfamethoxazole-Trimethoprim Nausea And Vomiting and Other (See Comments)    GI upset    Social History   Socioeconomic History   Marital status: Married    Spouse name: Not on file   Number of children: 0   Years of education: Not on file   Highest education level: Not on file  Occupational History   Occupation: retired  Tobacco Use   Smoking status: Never   Smokeless tobacco: Never  Vaping Use   Vaping status: Never Used  Substance and Sexual Activity   Alcohol use: No   Drug use: No   Sexual activity: Not Currently    Birth control/protection: Post-menopausal  Other Topics Concern   Not on file  Social History Narrative   Retired Midwife.    Social Drivers of Corporate investment banker Strain: Low Risk  (05/22/2022)   Overall  Financial Resource Strain (CARDIA)    Difficulty of Paying Living Expenses: Not very hard  Food Insecurity: No Food Insecurity (05/17/2023)   Hunger Vital Sign    Worried About Running Out of Food in the Last Year: Never true    Ran Out of Food in the Last Year: Never true  Transportation Needs: No Transportation Needs (05/17/2023)   PRAPARE - Administrator, Civil Service (Medical): No    Lack of Transportation (Non-Medical): No  Physical Activity: Insufficiently Active (03/17/2021)   Exercise Vital Sign    Days of Exercise per Week: 3 days    Minutes of Exercise per Session: 10 min  Stress: No Stress Concern Present (05/22/2022)   [employer] of Occupational Health - Occupational Stress Questionnaire    Feeling of Stress : Not at all  Social Connections: Socially Integrated (05/22/2022)   Social Connection and Isolation Panel [NHANES]    Frequency of Communication with Friends and Family: More than three times a week    Frequency of Social Gatherings with Friends and Family: More than three times a week    Attends Religious Services: More than 4 times per year    Active Member of [employer] or Organizations: Yes    Attends Engineer, structural: More than 4 times per year    Marital Status: Married  Catering manager Violence: Not At Risk (05/17/2023)   Humiliation, Afraid, Rape, and Kick questionnaire    Fear of Current or Ex-Partner: No    Emotionally Abused: No    Physically Abused: No    Sexually Abused: No    Tobacco Use: Low Risk  (12/31/2022)   Patient History    Smoking Tobacco Use: Never    Smokeless Tobacco Use: Never    Passive Exposure: Not on file   Social History   Substance and Sexual Activity  Alcohol Use No    Family History  Problem Relation Age of Onset   Breast cancer Mother        Age 74   Hypertension Mother    Heart disease Father    Stroke Father    Colon cancer Father        dx 28s   Breast cancer Paternal Grandmother         Age 51  Diabetes Paternal Grandmother    Heart disease Maternal Grandmother     ROS  Objective:  Physical Exam: - Well-developed female, alert, and oriented, no apparent distress. - Evaluation of her left hip shows normal range of motion with no discomfort. - Her left knee shows no effusion. Range of motion is about 10-120 degrees with crepitus on range of motion. She is tender medially. There is no lateral tenderness or any instability. - Her right knee shows no effusion. Range of motion is 0-125 degrees. Slight tenderness medially and laterally with no instability.   IMAGING: - Review of radiographs from August 2024 demonstrates severe bone-on-bone arthritis in the medial and patellofemoral compartments of the left knee with varus deformity.  Assessment/Plan:  End stage arthritis, left knee   The patient history, physical examination, clinical judgment of the provider and imaging studies are consistent with end stage degenerative joint disease of the left knee and total knee arthroplasty is deemed medically necessary. The treatment options including medical management, injection therapy arthroscopy and arthroplasty were discussed at length. The risks and benefits of total knee arthroplasty were presented and reviewed. The risks due to aseptic loosening, infection, stiffness, patella tracking problems, thromboembolic complications and other imponderables were discussed. The patient acknowledged the explanation, agreed to proceed with the plan and consent was signed. Patient is being admitted for inpatient treatment for surgery, pain control, PT, OT, prophylactic antibiotics, VTE prophylaxis, progressive ambulation and ADLs and discharge planning. The patient is planning to be discharged  home .   Patient's anticipated LOS is less than 2 midnights, meeting these requirements: - Younger than 40 - Lives within 1 hour of care - Has a competent adult at home to recover with post-op  recover - NO history of  - Chronic pain requiring opiods  - Diabetes  - Coronary Artery Disease  - Heart failure  - Heart attack  - Stroke  - DVT/VTE  - Cardiac arrhythmia  - Respiratory Failure/COPD  - Renal failure  - Anemia  - Advanced Liver disease  Therapy Plans: EO Disposition: Home with husband Planned DVT Prophylaxis: Xarelto 10mg  (hx breast cancer) DME Needed: RW ordered, has cane PCP: Evelena Peat, MD ("low surgical risk" - 06/24/23 OV note in Epic) TXA: IV Allergies: NKDA Anesthesia Concerns: None BMI: 31.9 Last HgbA1c: Not diabetic Pharmacy: [facility] on [address]  Other: -Pt's husband will call PCP office for clearance. Form faxed again today.  - Patient was instructed on what medications to stop prior to surgery. - Follow-up visit in 2 weeks with Dr. Lequita Halt - Begin physical therapy following surgery - Pre-operative lab work as pre-surgical testing - Prescriptions will be provided in hospital at time of discharge  Weston Brass, PA-C Orthopedic Surgery [facility] [place]

## 2023-06-13 ENCOUNTER — Other Ambulatory Visit: Payer: Self-pay | Admitting: Neurology

## 2023-06-14 NOTE — Telephone Encounter (Signed)
 Last seen on 09/13/22 No 1 year follow up scheduled

## 2023-06-24 ENCOUNTER — Ambulatory Visit (INDEPENDENT_AMBULATORY_CARE_PROVIDER_SITE_OTHER): Admitting: Family Medicine

## 2023-06-24 ENCOUNTER — Encounter: Payer: Self-pay | Admitting: Family Medicine

## 2023-06-24 VITALS — BP 136/60 | HR 95 | Temp 97.9°F | Wt 170.6 lb

## 2023-06-24 DIAGNOSIS — Z862 Personal history of diseases of the blood and blood-forming organs and certain disorders involving the immune mechanism: Secondary | ICD-10-CM

## 2023-06-24 DIAGNOSIS — E038 Other specified hypothyroidism: Secondary | ICD-10-CM

## 2023-06-24 DIAGNOSIS — Z01818 Encounter for other preprocedural examination: Secondary | ICD-10-CM | POA: Diagnosis not present

## 2023-06-24 DIAGNOSIS — R7303 Prediabetes: Secondary | ICD-10-CM | POA: Diagnosis not present

## 2023-06-24 DIAGNOSIS — I10 Essential (primary) hypertension: Secondary | ICD-10-CM

## 2023-06-24 DIAGNOSIS — E785 Hyperlipidemia, unspecified: Secondary | ICD-10-CM

## 2023-06-24 LAB — CBC WITH DIFFERENTIAL/PLATELET
Basophils Absolute: 0.1 10*3/uL (ref 0.0–0.1)
Basophils Relative: 1.1 % (ref 0.0–3.0)
Eosinophils Absolute: 0.6 10*3/uL (ref 0.0–0.7)
Eosinophils Relative: 8.6 % — ABNORMAL HIGH (ref 0.0–5.0)
HCT: 39.6 % (ref 36.0–46.0)
Hemoglobin: 13.2 g/dL (ref 12.0–15.0)
Lymphocytes Relative: 22.9 % (ref 12.0–46.0)
Lymphs Abs: 1.6 10*3/uL (ref 0.7–4.0)
MCHC: 33.3 g/dL (ref 30.0–36.0)
MCV: 84.2 fl (ref 78.0–100.0)
Monocytes Absolute: 0.8 10*3/uL (ref 0.1–1.0)
Monocytes Relative: 10.8 % (ref 3.0–12.0)
Neutro Abs: 3.9 10*3/uL (ref 1.4–7.7)
Neutrophils Relative %: 56.6 % (ref 43.0–77.0)
Platelets: 240 10*3/uL (ref 150.0–400.0)
RBC: 4.7 Mil/uL (ref 3.87–5.11)
RDW: 15.2 % (ref 11.5–15.5)
WBC: 7 10*3/uL (ref 4.0–10.5)

## 2023-06-24 LAB — COMPREHENSIVE METABOLIC PANEL
ALT: 25 U/L (ref 0–35)
AST: 21 U/L (ref 0–37)
Albumin: 4.4 g/dL (ref 3.5–5.2)
Alkaline Phosphatase: 63 U/L (ref 39–117)
BUN: 21 mg/dL (ref 6–23)
CO2: 24 meq/L (ref 19–32)
Calcium: 9.8 mg/dL (ref 8.4–10.5)
Chloride: 104 meq/L (ref 96–112)
Creatinine, Ser: 0.98 mg/dL (ref 0.40–1.20)
GFR: 53.51 mL/min — ABNORMAL LOW (ref >60.00)
Glucose, Bld: 113 mg/dL — ABNORMAL HIGH (ref 70–99)
Potassium: 4.3 meq/L (ref 3.5–5.1)
Sodium: 142 meq/L (ref 135–145)
Total Bilirubin: 0.4 mg/dL (ref 0.2–1.2)
Total Protein: 6.9 g/dL (ref 6.0–8.3)

## 2023-06-24 LAB — LIPID PANEL
Cholesterol: 185 mg/dL (ref 0–200)
HDL: 56.8 mg/dL (ref >39.00)
LDL Cholesterol: 87 mg/dL (ref 0–99)
NonHDL: 128.57
Total CHOL/HDL Ratio: 3
Triglycerides: 209 mg/dL — ABNORMAL HIGH (ref 0.0–149.0)
VLDL: 41.8 mg/dL — ABNORMAL HIGH (ref 0.0–40.0)

## 2023-06-24 LAB — TSH: TSH: 2.3 u[IU]/mL (ref 0.35–5.50)

## 2023-06-24 LAB — HEMOGLOBIN A1C: Hgb A1c MFr Bld: 6.5 % (ref 4.6–6.5)

## 2023-06-24 NOTE — Patient Instructions (Signed)
 Be sure to AVOID any aspirin or Excedrin for at least 5 days prior to surgery.

## 2023-06-24 NOTE — Progress Notes (Addendum)
 Anesthesia Review:  PCP: Evelena Peat LVO 11/09/22  Cardiologist : [facility]- [facility] LVO 09/13/22  PPM/ ICD: Device Orders: Rep Notified:  Chest x-ray : EKG : 10/16/22  Echo : Stress t 2022 est: Cardiac Cath :   Activity level:  Sleep Study/ CPAP : Fasting Blood Sugar :      / Checks Blood Sugar -- times a day:    Blood Thinner/ Instructions /Last Dose: ASA / Instructions/ Last Dose :

## 2023-06-24 NOTE — Patient Instructions (Signed)
 SURGICAL WAITING ROOM VISITATION  Patients having surgery or a procedure may have no more than 2 support people in the waiting area - these visitors may rotate.    Children under the age of 2 must have an adult with them who is not the patient.  Due to an increase in RSV and influenza rates and associated hospitalizations, children ages 45 and under may not visit patients in [facility] hospitals.  Visitors with respiratory illnesses are discouraged from visiting and should remain at home.  If the patient needs to stay at the hospital during part of their recovery, the visitor guidelines for inpatient rooms apply. Pre-op nurse will coordinate an appropriate time for 1 support person to accompany patient in pre-op.  This support person may not rotate.    Please refer to the [facility] website for the visitor guidelines for Inpatients (after your surgery is over and you are in a regular room).       Your procedure is scheduled on:  07/08/23    Report to [facility] Main Entrance    Report to admitting at 1000 am   Call this number if you have problems the morning of surgery [phone]   Do not eat food :After Midnight.   After Midnight you may have the following liquids until _ 0930_____ AM  DAY OF SURGERY  Water Non-Citrus Juices (without pulp, NO RED-Apple, White grape, White cranberry) Black Coffee (NO MILK/CREAM OR CREAMERS, sugar ok)  Clear Tea (NO MILK/CREAM OR CREAMERS, sugar ok) regular and decaf                             Plain Jell-O (NO RED)                                           Fruit ices (not with fruit pulp, NO RED)                                     Popsicles (NO RED)                                                               Sports drinks like Gatorade (NO RED)        The day of surgery:  Drink ONE (1) Pre-Surgery Clear Ensure or G2 at  0930AM the morning of surgery. Drink in one sitting. Do not sip.  This drink was given to you during your  hospital  pre-op appointment visit. Nothing else to drink after completing the  Pre-Surgery Clear Ensure or G2.          If you have questions, please contact your surgeon's office.       Oral Hygiene is also important to reduce your risk of infection.                                    Remember - BRUSH YOUR TEETH THE MORNING OF SURGERY WITH YOUR REGULAR TOOTHPASTE  DENTURES  WILL BE REMOVED PRIOR TO SURGERY PLEASE DO NOT APPLY "Poly grip" OR ADHESIVES!!!   Do NOT smoke after Midnight   Stop all vitamins and herbal supplements 7 days before surgery.   Take these medicines the morning of surgery with A SIP OF WATER:  amlodipine, nexium, synthroid, metoprolol   DO NOT TAKE ANY ORAL DIABETIC MEDICATIONS DAY OF YOUR SURGERY  Bring CPAP mask and tubing day of surgery.                              You may not have any metal on your body including hair pins, jewelry, and body piercing             Do not wear make-up, lotions, powders, perfumes/cologne, or deodorant  Do not wear nail polish including gel and S&S, artificial/acrylic nails, or any other type of covering on natural nails including finger and toenails. If you have artificial nails, gel coating, etc. that needs to be removed by a nail salon please have this removed prior to surgery or surgery may need to be canceled/ delayed if the surgeon/ anesthesia feels like they are unable to be safely monitored.   Do not shave  48 hours prior to surgery.               Men may shave face and neck.   Do not bring valuables to the hospital. [facility] IS NOT             RESPONSIBLE   FOR VALUABLES.   Contacts, glasses, dentures or bridgework may not be worn into surgery.   Bring small overnight bag day of surgery.   DO NOT BRING YOUR HOME MEDICATIONS TO THE HOSPITAL. PHARMACY WILL DISPENSE MEDICATIONS LISTED ON YOUR MEDICATION LIST TO YOU DURING YOUR ADMISSION IN THE HOSPITAL!    Patients discharged on the day of surgery will not  be allowed to drive home.  Someone NEEDS to stay with you for the first 24 hours after anesthesia.   Special Instructions: Bring a copy of your healthcare power of attorney and living will documents the day of surgery if you haven't scanned them before.              Please read over the following fact sheets you were given: IF YOU HAVE QUESTIONS ABOUT YOUR PRE-OP INSTRUCTIONS PLEASE CALL [phone]   If you received a COVID test during your pre-op visit  it is requested that you wear a mask when out in public, stay away from anyone that may not be feeling well and notify your surgeon if you develop symptoms. If you test positive for Covid or have been in contact with anyone that has tested positive in the last 10 days please notify you surgeon.      Pre-operative 5 CHG Bath Instructions   You can play a key role in reducing the risk of infection after surgery. Your skin needs to be as free of germs as possible. You can reduce the number of germs on your skin by washing with CHG (chlorhexidine gluconate) soap before surgery. CHG is an antiseptic soap that kills germs and continues to kill germs even after washing.   DO NOT use if you have an allergy to chlorhexidine/CHG or antibacterial soaps. If your skin becomes reddened or irritated, stop using the CHG and notify one of our RNs at [phone].   Please shower with the CHG soap starting 4 days  before surgery using the following schedule:     Please keep in mind the following:  DO NOT shave, including legs and underarms, starting the day of your first shower.   You may shave your face at any point before/day of surgery.  Place clean sheets on your bed the day you start using CHG soap. Use a clean washcloth (not used since being washed) for each shower. DO NOT sleep with pets once you start using the CHG.   CHG Shower Instructions:  If you choose to wash your hair and private area, wash first with your normal shampoo/soap.  After you  use shampoo/soap, rinse your hair and body thoroughly to remove shampoo/soap residue.  Turn the water OFF and apply about 3 tablespoons (45 ml) of CHG soap to a CLEAN washcloth.  Apply CHG soap ONLY FROM YOUR NECK DOWN TO YOUR TOES (washing for 3-5 minutes)  DO NOT use CHG soap on face, private areas, open wounds, or sores.  Pay special attention to the area where your surgery is being performed.  If you are having back surgery, having someone wash your back for you may be helpful. Wait 2 minutes after CHG soap is applied, then you may rinse off the CHG soap.  Pat dry with a clean towel  Put on clean clothes/pajamas   If you choose to wear lotion, please use ONLY the CHG-compatible lotions on the back of this paper.     Additional instructions for the day of surgery: DO NOT APPLY any lotions, deodorants, cologne, or perfumes.   Put on clean/comfortable clothes.  Brush your teeth.  Ask your nurse before applying any prescription medications to the skin.      CHG Compatible Lotions   Aveeno Moisturizing lotion  Cetaphil Moisturizing Cream  Cetaphil Moisturizing Lotion  Clairol Herbal Essence Moisturizing Lotion, Dry Skin  Clairol Herbal Essence Moisturizing Lotion, Extra Dry Skin  Clairol Herbal Essence Moisturizing Lotion, Normal Skin  Curel Age Defying Therapeutic Moisturizing Lotion with Alpha Hydroxy  Curel Extreme Care Body Lotion  Curel Soothing Hands Moisturizing Hand Lotion  Curel Therapeutic Moisturizing Cream, Fragrance-Free  Curel Therapeutic Moisturizing Lotion, Fragrance-Free  Curel Therapeutic Moisturizing Lotion, Original Formula  Eucerin Daily Replenishing Lotion  Eucerin Dry Skin Therapy Plus Alpha Hydroxy Crme  Eucerin Dry Skin Therapy Plus Alpha Hydroxy Lotion  Eucerin Original Crme  Eucerin Original Lotion  Eucerin Plus Crme Eucerin Plus Lotion  Eucerin TriLipid Replenishing Lotion  Keri Anti-Bacterial Hand Lotion  Keri Deep Conditioning Original Lotion  Dry Skin Formula Softly Scented  Keri Deep Conditioning Original Lotion, Fragrance Free Sensitive Skin Formula  Keri Lotion Fast Absorbing Fragrance Free Sensitive Skin Formula  Keri Lotion Fast Absorbing Softly Scented Dry Skin Formula  Keri Original Lotion  Keri Skin Renewal Lotion Keri Silky Smooth Lotion  Keri Silky Smooth Sensitive Skin Lotion  Nivea Body Creamy Conditioning Oil  Nivea Body Extra Enriched Teacher, adult education Moisturizing Lotion Nivea Crme  Nivea Skin Firming Lotion  NutraDerm 30 Skin Lotion  NutraDerm Skin Lotion  NutraDerm Therapeutic Skin Cream  NutraDerm Therapeutic Skin Lotion  ProShield Protective Hand Cream  Provon moisturizing lotion

## 2023-06-24 NOTE — Progress Notes (Signed)
 Established Patient Office Visit  Subjective   Patient ID: [name], female    DOB: 1941-03-14  Age: 83 y.o. MRN: [id number]  Chief Complaint  Patient presents with   Pre-op Exam    HPI   [name] seen today for preoperative clearance for proposed left knee arthroplasty March 24.  She is accompanied by her husband.  She has past medical history significant for history of anemia with past history of gastric ulcer, heart failure with preserved ejection fraction, hypertension, history of migraine headaches, GERD, hypothyroidism, cognitive impairment, history of breast cancer, hyperlipidemia, prediabetes.  Medications reviewed.  Her medication list is up to date with exception that she is not taking atorvastatin. They think that she simply ran out.  Did not recall any side effect from taking the medication.  She is compliant with other medications.  Last TSH was last summer.  She has not had a lipid panel in almost couple years.  Denies any recent chest pains.  She does not have any known coronary history.  No chronic lung disease.  No known history of DVT or PE.  She reports that she has been taking some Excedrin recently.  No regular aspirin.  Past Medical History:  Diagnosis Date   Anemia    Anxiety    Arthritis    knees   Atrophic vaginitis    Cancer (HCC)    breast cancer - left   Diverticulosis    Endometriosis    Family history of breast cancer    Family history of colon cancer    GERD (gastroesophageal reflux disease)    Headache(784.0)    Heart murmur    as a child only   History of kidney stones    passed stones, no surgery required   History of radiation therapy 01/30/16- 03/02/16   Left Breast 50 Gy in 25 fractions.    Hyperlipidemia    Hypertension    Hypothyroidism    Internal hemorrhoids    Osteopenia    Osteoporosis    Personal history of chemotherapy    Personal history of radiation therapy    Rheumatic fever    age 51   Sleep apnea     " very mild" does not wear CPAP   Thyroid disease    Hypothyroid   Ulcer    UTI (lower urinary tract infection)    Past Surgical History:  Procedure Laterality Date   APPENDECTOMY     BIOPSY  08/23/2022   Procedure: BIOPSY;  Surgeon: Beverley Fiedler, MD;  Location: [facility] ENDOSCOPY;  Service: Gastroenterology;;   BREAST BIOPSY Left 08/19/2015   BREAST LUMPECTOMY Left 09/08/2015   BREAST LUMPECTOMY WITH RADIOACTIVE SEED AND SENTINEL LYMPH NODE BIOPSY Left 09/09/2015   Procedure: LEFT BREAST LUMPECTOMY WITH RADIOACTIVE SEED AND SENTINEL LYMPH NODE BIOPSY;  Surgeon: Avel Peace, MD;  Location: [facility] OR;  Service: General;  Laterality: Left;   COLONOSCOPY     COLONOSCOPY N/A 08/23/2022   Procedure: COLONOSCOPY;  Surgeon: Beverley Fiedler, MD;  Location: [facility] ENDOSCOPY;  Service: Gastroenterology;  Laterality: N/A;   DILATATION & CURETTAGE/HYSTEROSCOPY WITH MYOSURE N/A 10/09/2016   Procedure: DILATATION & CURETTAGE/HYSTEROSCOPY WITH MYOSURE;  Surgeon: Genia Del, MD;  Location: [facility] ORS;  Service: Gynecology;  Laterality: N/A;  requesting 7:30am in our block  requests one hour   DILATATION & CURETTAGE/HYSTEROSCOPY WITH MYOSURE N/A 03/19/2022   Procedure: DILATATION & CURETTAGE/HYSTEROSCOPY;  Surgeon: Romualdo Bolk, MD;  Location: [facility];  Service: Gynecology;  Laterality: N/A;   ESOPHAGOGASTRODUODENOSCOPY (EGD) WITH PROPOFOL N/A 08/23/2022   Procedure: ESOPHAGOGASTRODUODENOSCOPY (EGD) WITH PROPOFOL;  Surgeon: Beverley Fiedler, MD;  Location: [facility] ENDOSCOPY;  Service: Gastroenterology;  Laterality: N/A;   ESOPHAGOGASTRODUODENOSCOPY ENDOSCOPY     IR GENERIC HISTORICAL  12/05/2015   IR CV LINE INJECTION 12/05/2015 Jolaine Click, MD [facility]-INTER[facility] RAD   LAPAROSCOPIC ENDOMETRIOSIS FULGURATION  1978   PELVIC LAPAROSCOPY     PORTACATH PLACEMENT N/A 10/04/2015   Procedure: INSERTION PORT-A-CATH WITH ULTRASOUND;  Surgeon: Avel Peace, MD;  Location: [facility] OR;  Service: General;  Laterality: N/A;    portacath removal     TONSILLECTOMY     ULNAR NERVE REPAIR  2010    reports that she has never smoked. She has never used smokeless tobacco. She reports that she does not drink alcohol and does not use drugs. family history includes Breast cancer in her mother and paternal grandmother; Colon cancer in her father; Diabetes in her paternal grandmother; Heart disease in her father and maternal grandmother; Hypertension in her mother; Stroke in her father. Allergies  Allergen Reactions   Ferrlecit [Na Ferric Gluc Cplx In Sucrose] Shortness Of Breath and Other (See Comments)    Made the hands and feet feel odd, also   Nitrofurantoin Nausea And Vomiting and Other (See Comments)    GI upset   Sulfamethoxazole-Trimethoprim Nausea And Vomiting and Other (See Comments)    GI upset    Review of Systems  Constitutional:  Negative for chills, fever and malaise/fatigue.  Eyes:  Negative for blurred vision.  Respiratory:  Negative for shortness of breath.   Cardiovascular:  Negative for chest pain.  Gastrointestinal:  Negative for abdominal pain.  Genitourinary:  Negative for dysuria.  Neurological:  Negative for dizziness, weakness and headaches.      Objective:     BP 136/60 (BP Location: Left Arm, Patient Position: Sitting, Cuff Size: Normal)   Pulse 95   Temp 97.9 F (36.6 C) (Oral)   Wt 170 lb 9.6 oz (77.4 kg)   SpO2 95%   BMI 33.32 kg/m  BP Readings from Last 3 Encounters:  06/24/23 136/60  02/20/23 (!) 123/47  12/31/22 (!) 122/52   Wt Readings from Last 3 Encounters:  06/24/23 170 lb 9.6 oz (77.4 kg)  02/20/23 163 lb 12.8 oz (74.3 kg)  12/31/22 147 lb (66.7 kg)      Physical Exam Vitals reviewed.  Constitutional:      Appearance: She is well-developed.  Eyes:     Pupils: Pupils are equal, round, and reactive to light.  Neck:     Thyroid: No thyromegaly.     Vascular: No JVD.     Comments: No carotid bruits Cardiovascular:     Rate and Rhythm: Normal rate and  regular rhythm.     Heart sounds:     No gallop.  Pulmonary:     Effort: Pulmonary effort is normal. No respiratory distress.     Breath sounds: Normal breath sounds. No wheezing or rales.  Musculoskeletal:     Cervical back: Neck supple.     Right lower leg: No edema.     Left lower leg: No edema.  Neurological:     General: No focal deficit present.     Mental Status: She is alert.      No results found for any visits on 06/24/23.    The ASCVD Risk score (Arnett DK, et al., 2019) failed to calculate for the following reasons:   The 2019  ASCVD risk score is only valid for ages 28 to 26    Assessment & Plan:   Problem List Items Addressed This Visit       Unprioritized   Hypothyroidism   Relevant Orders   TSH   Hypertension   Relevant Orders   CMP   Hyperlipidemia   Relevant Orders   Lipid panel   CMP   Prediabetes   Relevant Orders   Hemoglobin A1c   Other Visit Diagnoses       Preoperative clearance    -  Primary   Relevant Orders   CBC with Differential/Platelet     History of anemia       Relevant Orders   CBC with Differential/Platelet     Patient is a 83 year old female here with chronic medical problems as above who is here for preoperative assessment.  No chronic lung disease or active cardiac problems.  No known history of CAD.  No recent concerning symptoms.  Overall low surgical risk.  We have advised her to avoid all aspirin products for at least 5 days prior to surgery.  EKG today shows sinus rhythm with no acute changes.  Preoperative labs will be obtained including CBC, CMP, TSH, A1c  She also had questions regarding whether she should be on atorvastatin.  We will check lipids first although she has apparently been off this medication for couple months now.  No follow-ups on file.    Evelena Peat, MD

## 2023-06-26 ENCOUNTER — Encounter (HOSPITAL_COMMUNITY)
Admission: RE | Admit: 2023-06-26 | Discharge: 2023-06-26 | Disposition: A | Discharge status: Home / Self Care | Payer: Medicare Other | Source: Ambulatory Visit | Attending: Orthopedic Surgery | Admitting: Orthopedic Surgery

## 2023-06-26 ENCOUNTER — Encounter (HOSPITAL_COMMUNITY): Payer: Self-pay

## 2023-06-26 ENCOUNTER — Other Ambulatory Visit: Payer: Self-pay

## 2023-06-26 VITALS — BP 123/63 | HR 86 | Temp 98.2°F | Resp 16 | Ht 60.0 in

## 2023-06-26 DIAGNOSIS — Z01812 Encounter for preprocedural laboratory examination: Secondary | ICD-10-CM | POA: Insufficient documentation

## 2023-06-26 DIAGNOSIS — Z01818 Encounter for other preprocedural examination: Secondary | ICD-10-CM

## 2023-06-26 DIAGNOSIS — E039 Hypothyroidism, unspecified: Secondary | ICD-10-CM | POA: Diagnosis not present

## 2023-06-26 DIAGNOSIS — K279 Peptic ulcer, site unspecified, unspecified as acute or chronic, without hemorrhage or perforation: Secondary | ICD-10-CM | POA: Insufficient documentation

## 2023-06-26 DIAGNOSIS — Z9221 Personal history of antineoplastic chemotherapy: Secondary | ICD-10-CM | POA: Insufficient documentation

## 2023-06-26 DIAGNOSIS — Z981 Arthrodesis status: Secondary | ICD-10-CM | POA: Insufficient documentation

## 2023-06-26 DIAGNOSIS — I110 Hypertensive heart disease with heart failure: Secondary | ICD-10-CM | POA: Diagnosis not present

## 2023-06-26 DIAGNOSIS — I5032 Chronic diastolic (congestive) heart failure: Secondary | ICD-10-CM | POA: Diagnosis not present

## 2023-06-26 DIAGNOSIS — F0390 Unspecified dementia without behavioral disturbance: Secondary | ICD-10-CM | POA: Insufficient documentation

## 2023-06-26 DIAGNOSIS — M1712 Unilateral primary osteoarthritis, left knee: Secondary | ICD-10-CM | POA: Insufficient documentation

## 2023-06-26 DIAGNOSIS — Z853 Personal history of malignant neoplasm of breast: Secondary | ICD-10-CM | POA: Insufficient documentation

## 2023-06-26 DIAGNOSIS — Z923 Personal history of irradiation: Secondary | ICD-10-CM | POA: Diagnosis not present

## 2023-06-26 LAB — CBC
HCT: 39.4 % (ref 36.0–46.0)
Hemoglobin: 13 g/dL (ref 12.0–15.0)
MCH: 28.4 pg (ref 26.0–34.0)
MCHC: 33 g/dL (ref 30.0–36.0)
MCV: 86 fL (ref 80.0–100.0)
Platelets: 221 10*3/uL (ref 150–400)
RBC: 4.58 MIL/uL (ref 3.87–5.11)
RDW: 14.4 % (ref 11.5–15.5)
WBC: 6.5 10*3/uL (ref 4.0–10.5)
nRBC: 0 % (ref 0.0–0.2)

## 2023-06-26 LAB — BASIC METABOLIC PANEL
Anion gap: 10 (ref 5–15)
BUN: 21 mg/dL (ref 8–23)
CO2: 25 mmol/L (ref 22–32)
Calcium: 9.5 mg/dL (ref 8.9–10.3)
Chloride: 104 mmol/L (ref 98–111)
Creatinine, Ser: 0.81 mg/dL (ref 0.44–1.00)
GFR, Estimated: >60 mL/min (ref >60)
Glucose, Bld: 104 mg/dL — ABNORMAL HIGH (ref 70–99)
Potassium: 3.7 mmol/L (ref 3.5–5.1)
Sodium: 139 mmol/L (ref 135–145)

## 2023-06-26 LAB — SURGICAL PCR SCREEN
MRSA, PCR: NEGATIVE
Staphylococcus aureus: NEGATIVE

## 2023-06-27 ENCOUNTER — Encounter (HOSPITAL_COMMUNITY): Payer: Self-pay

## 2023-06-27 NOTE — Anesthesia Preprocedure Evaluation (Addendum)
 Anesthesia Evaluation  Patient identified by MRN, date of birth, ID band Patient awake    Reviewed: Allergy & Precautions, NPO status , Patient's Chart, lab work & pertinent test results, reviewed documented beta blocker date and time   Airway Mallampati: II  TM Distance: >3 FB Neck ROM: Full    Dental  (+) Teeth Intact, Dental Advisory Given   Pulmonary neg pulmonary ROS   Pulmonary exam normal breath sounds clear to auscultation       Cardiovascular hypertension, Pt. on medications and Pt. on home beta blockers Normal cardiovascular exam Rhythm:Regular Rate:Normal     Neuro/Psych  Headaches scoliosis s/p thoracolumbar fusion T3-pelvis  Neuromuscular disease    GI/Hepatic Neg liver ROS, PUD,GERD  Medicated,,  Endo/Other  Hypothyroidism  Obesity   Renal/GU Renal InsufficiencyRenal disease     Musculoskeletal  (+) Arthritis ,    Abdominal   Peds  Hematology Plt 221k    Anesthesia Other Findings Day of surgery medications reviewed with the patient.  Left breast cancer   Reproductive/Obstetrics                             Anesthesia Physical Anesthesia Plan  ASA: 3  Anesthesia Plan: General   Post-op Pain Management: Tylenol PO (pre-op)* and Regional block*   Induction: Intravenous  PONV Risk Score and Plan: 3 and TIVA, Dexamethasone and Ondansetron  Airway Management Planned: Oral ETT  Additional Equipment:   Intra-op Plan:   Post-operative Plan: Extubation in OR  Informed Consent: I have reviewed the patients History and Physical, chart, labs and discussed the procedure including the risks, benefits and alternatives for the proposed anesthesia with the patient or authorized representative who has indicated his/her understanding and acceptance.     Dental advisory given  Plan Discussed with: CRNA, Anesthesiologist and Surgeon  Anesthesia Plan Comments: (See PAT note  from 3/12 by Sherlie Ban PA-C )        Anesthesia Quick Evaluation

## 2023-06-27 NOTE — Progress Notes (Signed)
 Case: [id number] Date/Time: 07/08/23 1221   Procedure: ARTHROPLASTY, KNEE, TOTAL (Left: Knee)   Anesthesia type: Choice   Pre-op diagnosis: Left Knee Ostoearthritis   [facility] ROOM 10 / [facility]   Surgeons: Ollen Gross, MD       DISCUSSION: [name] is an 83 yo female who presents to PAT prior to surgery above. PMH of HTN, HFpEF, dementia, left breast cancer s/p lumpectomy and chemo/XRT (2017), PUD, hypothyroid, arthritis, scoliosis s/p thoracolumbar fusion T3-pelvis (2019).  Prior surgeries complicated by post op delirium  Patient seen by PCP for pre op exam on 06/24/23. Cleared for surgery:  "Patient is a 83 year old female here with chronic medical problems as above who is here for preoperative assessment.  No chronic lung disease or active cardiac problems.  No known history of CAD.  No recent concerning symptoms.  Overall low surgical risk.  We have advised her to avoid all aspirin products for at least 5 days prior to surgery.  EKG today shows sinus rhythm with no acute changes.  Preoperative labs will be obtained including CBC, CMP, TSH, A1c"  VS: BP 123/63   Pulse 86   Temp 36.8 C (Oral)   Resp 16   Ht 5' (1.524 m)   SpO2 99%   BMI 33.32 kg/m   PROVIDERS: Kristian Covey, MD   LABS: Labs reviewed: Acceptable for surgery. (all labs ordered are listed, but only abnormal results are displayed)  Labs Reviewed  BASIC METABOLIC PANEL - Abnormal; Notable for the following components:      Result Value   Glucose, Bld 104 (*)    All other components within normal limits  SURGICAL PCR SCREEN  CBC     IMAGES:   EKG 06/24/23:  NSR, rate 80  CV:  Echo 10/24/2020:  IMPRESSIONS    1. Left ventricular ejection fraction, by estimation, is 60 to 65%. The left ventricle has normal function. The left ventricle has no regional wall motion abnormalities. Left ventricular diastolic parameters are consistent with Grade II diastolic dysfunction  (pseudonormalization). Elevated left ventricular end-diastolic pressure.  2. Right ventricular systolic function is normal. The right ventricular size is normal.  3. Left atrial size was mildly dilated.  4. No subcostal windows.  5. The mitral valve is normal in structure. Trivial mitral valve regurgitation. No evidence of mitral stenosis.  6. The aortic valve is tricuspid. There is mild calcification of the aortic valve. Aortic valve regurgitation is mild. Mild aortic valve sclerosis is present, with no evidence of aortic valve stenosis.  7. The inferior vena cava is normal in size with greater than 50% respiratory variability, suggesting right atrial pressure of 3 mmHg. Past Medical History:  Diagnosis Date   Arthritis    knees   Atrophic vaginitis    Cancer (HCC)    breast cancer - left   Diverticulosis    Endometriosis    Family history of breast cancer    Family history of colon cancer    Heart murmur    as a child only   History of radiation therapy 01/30/16- 03/02/16   Left Breast 50 Gy in 25 fractions.    Hyperlipidemia    Hypertension    Internal hemorrhoids    Osteopenia    Osteoporosis    Personal history of chemotherapy    Personal history of radiation therapy    Rheumatic fever    age 60   Thyroid disease    Hypothyroid   Ulcer    UTI (  lower urinary tract infection)     Past Surgical History:  Procedure Laterality Date   APPENDECTOMY     BIOPSY  08/23/2022   Procedure: BIOPSY;  Surgeon: Beverley Fiedler, MD;  Location: [facility] ENDOSCOPY;  Service: Gastroenterology;;   BREAST BIOPSY Left 08/19/2015   BREAST LUMPECTOMY Left 09/08/2015   BREAST LUMPECTOMY WITH RADIOACTIVE SEED AND SENTINEL LYMPH NODE BIOPSY Left 09/09/2015   Procedure: LEFT BREAST LUMPECTOMY WITH RADIOACTIVE SEED AND SENTINEL LYMPH NODE BIOPSY;  Surgeon: Avel Peace, MD;  Location: [facility] OR;  Service: General;  Laterality: Left;   COLONOSCOPY     COLONOSCOPY N/A 08/23/2022   Procedure: COLONOSCOPY;   Surgeon: Beverley Fiedler, MD;  Location: [facility] ENDOSCOPY;  Service: Gastroenterology;  Laterality: N/A;   DILATATION & CURETTAGE/HYSTEROSCOPY WITH MYOSURE N/A 10/09/2016   Procedure: DILATATION & CURETTAGE/HYSTEROSCOPY WITH MYOSURE;  Surgeon: Genia Del, MD;  Location: [facility];  Service: Gynecology;  Laterality: N/A;  requesting 7:30am in our block  requests one hour   DILATATION & CURETTAGE/HYSTEROSCOPY WITH MYOSURE N/A 03/19/2022   Procedure: DILATATION & CURETTAGE/HYSTEROSCOPY;  Surgeon: Romualdo Bolk, MD;  Location: [facility];  Service: Gynecology;  Laterality: N/A;   ESOPHAGOGASTRODUODENOSCOPY (EGD) WITH PROPOFOL N/A 08/23/2022   Procedure: ESOPHAGOGASTRODUODENOSCOPY (EGD) WITH PROPOFOL;  Surgeon: Beverley Fiedler, MD;  Location: [facility] ENDOSCOPY;  Service: Gastroenterology;  Laterality: N/A;   ESOPHAGOGASTRODUODENOSCOPY ENDOSCOPY     IR GENERIC HISTORICAL  12/05/2015   IR CV LINE INJECTION 12/05/2015 Jolaine Click, MD [facility]   LAPAROSCOPIC ENDOMETRIOSIS FULGURATION  1978   PELVIC LAPAROSCOPY     PORTACATH PLACEMENT N/A 10/04/2015   Procedure: INSERTION PORT-A-CATH WITH ULTRASOUND;  Surgeon: Avel Peace, MD;  Location: [facility] OR;  Service: General;  Laterality: N/A;   portacath removal     TONSILLECTOMY     ULNAR NERVE REPAIR  2010    MEDICATIONS:  amLODipine (NORVASC) 5 MG tablet   atorvastatin (LIPITOR) 20 MG tablet   donepezil (ARICEPT) 5 MG tablet   esomeprazole (NEXIUM) 40 MG capsule   ferrous sulfate 324 MG TBEC   furosemide (LASIX) 20 MG tablet   levothyroxine (SYNTHROID) 50 MCG tablet   losartan-hydrochlorothiazide (HYZAAR) 100-12.5 MG tablet   metoprolol tartrate (LOPRESSOR) 25 MG tablet   potassium chloride (KLOR-CON 10) 10 MEQ tablet   tamoxifen (NOLVADEX) 20 MG tablet   TYLENOL 325 MG tablet   No current facility-administered medications for this encounter.   [name] [facility] Surgical Short Stay/Anesthesiology [facility] Phone [phone] 06/27/2023 11:01 AM

## 2023-07-08 ENCOUNTER — Ambulatory Visit (HOSPITAL_BASED_OUTPATIENT_CLINIC_OR_DEPARTMENT_OTHER): Admitting: Anesthesiology

## 2023-07-08 ENCOUNTER — Other Ambulatory Visit: Payer: Self-pay

## 2023-07-08 ENCOUNTER — Encounter (HOSPITAL_COMMUNITY): Payer: Self-pay | Admitting: Orthopedic Surgery

## 2023-07-08 ENCOUNTER — Ambulatory Visit (HOSPITAL_COMMUNITY): Payer: Self-pay | Admitting: Medical

## 2023-07-08 ENCOUNTER — Observation Stay (HOSPITAL_COMMUNITY)
Admission: RE | Admit: 2023-07-08 | Discharge: 2023-07-10 | Disposition: A | Discharge status: Home / Self Care | Payer: Medicare Other | Source: Ambulatory Visit | Attending: Orthopedic Surgery | Admitting: Orthopedic Surgery

## 2023-07-08 ENCOUNTER — Encounter (HOSPITAL_COMMUNITY)
Admission: RE | Disposition: A | Discharge status: Home / Self Care | Payer: Self-pay | Source: Ambulatory Visit | Attending: Orthopedic Surgery

## 2023-07-08 DIAGNOSIS — E039 Hypothyroidism, unspecified: Secondary | ICD-10-CM | POA: Diagnosis not present

## 2023-07-08 DIAGNOSIS — M1712 Unilateral primary osteoarthritis, left knee: Secondary | ICD-10-CM | POA: Diagnosis not present

## 2023-07-08 DIAGNOSIS — I130 Hypertensive heart and chronic kidney disease with heart failure and stage 1 through stage 4 chronic kidney disease, or unspecified chronic kidney disease: Secondary | ICD-10-CM | POA: Insufficient documentation

## 2023-07-08 DIAGNOSIS — N183 Chronic kidney disease, stage 3 unspecified: Secondary | ICD-10-CM | POA: Diagnosis not present

## 2023-07-08 DIAGNOSIS — Z79899 Other long term (current) drug therapy: Secondary | ICD-10-CM | POA: Diagnosis not present

## 2023-07-08 DIAGNOSIS — I5030 Unspecified diastolic (congestive) heart failure: Secondary | ICD-10-CM | POA: Insufficient documentation

## 2023-07-08 DIAGNOSIS — G8918 Other acute postprocedural pain: Secondary | ICD-10-CM | POA: Diagnosis not present

## 2023-07-08 DIAGNOSIS — M179 Osteoarthritis of knee, unspecified: Principal | ICD-10-CM | POA: Diagnosis present

## 2023-07-08 DIAGNOSIS — Z01818 Encounter for other preprocedural examination: Secondary | ICD-10-CM

## 2023-07-08 DIAGNOSIS — Z853 Personal history of malignant neoplasm of breast: Secondary | ICD-10-CM | POA: Diagnosis not present

## 2023-07-08 HISTORY — PX: TOTAL KNEE ARTHROPLASTY: SHX125

## 2023-07-08 SURGERY — ARTHROPLASTY, KNEE, TOTAL
Anesthesia: General | Site: Knee | Laterality: Left

## 2023-07-08 MED ORDER — FENTANYL CITRATE (PF) 100 MCG/2ML IJ SOLN
INTRAMUSCULAR | Status: DC | PRN
  Administered 2023-07-08 (×2): 50 ug via INTRAVENOUS
  Administered 2023-07-08: 25 ug via INTRAVENOUS

## 2023-07-08 MED ORDER — ACETAMINOPHEN 10 MG/ML IV SOLN
INTRAVENOUS | Status: DC | PRN
  Administered 2023-07-08: 1000 mg via INTRAVENOUS

## 2023-07-08 MED ORDER — BUPIVACAINE LIPOSOME 1.3 % IJ SUSP: 20.0000 mL | Freq: Once | INTRAMUSCULAR | Status: DC

## 2023-07-08 MED ORDER — OXYCODONE HCL 5 MG PO TABS
ORAL_TABLET | ORAL | Status: AC
  Filled 2023-07-08: qty 1

## 2023-07-08 MED ORDER — METHOCARBAMOL 500 MG PO TABS
500.0000 mg | ORAL_TABLET | Freq: Four times a day (QID) | ORAL | Status: DC | PRN
  Administered 2023-07-08 – 2023-07-10 (×5): 500 mg via ORAL
  Filled 2023-07-08 (×5): qty 1

## 2023-07-08 MED ORDER — CHLORHEXIDINE GLUCONATE 0.12 % MT SOLN
15.0000 mL | Freq: Once | OROMUCOSAL | Status: AC
  Administered 2023-07-08: 15 mL via OROMUCOSAL

## 2023-07-08 MED ORDER — METOPROLOL TARTRATE 25 MG PO TABS
25.0000 mg | ORAL_TABLET | Freq: Two times a day (BID) | ORAL | Status: DC
Start: 2023-07-08 — End: 2023-07-10
  Administered 2023-07-08 – 2023-07-10 (×4): 25 mg via ORAL
  Filled 2023-07-08 (×4): qty 1

## 2023-07-08 MED ORDER — FENTANYL CITRATE (PF) 100 MCG/2ML IJ SOLN
INTRAMUSCULAR | Status: AC
  Filled 2023-07-08: qty 2

## 2023-07-08 MED ORDER — ONDANSETRON HCL 4 MG PO TABS: 4.0000 mg | ORAL_TABLET | Freq: Four times a day (QID) | ORAL | Status: DC | PRN

## 2023-07-08 MED ORDER — PHENOL 1.4 % MT LIQD: 1.0000 | OROMUCOSAL | Status: DC | PRN

## 2023-07-08 MED ORDER — TRANEXAMIC ACID-NACL 1000-0.7 MG/100ML-% IV SOLN
1000.0000 mg | INTRAVENOUS | Status: DC
  Filled 2023-07-08: qty 100

## 2023-07-08 MED ORDER — ROCURONIUM BROMIDE 10 MG/ML (PF) SYRINGE
PREFILLED_SYRINGE | INTRAVENOUS | Status: DC | PRN
  Administered 2023-07-08: 50 mg via INTRAVENOUS

## 2023-07-08 MED ORDER — BISACODYL 10 MG RE SUPP: 10.0000 mg | Freq: Every day | RECTAL | Status: DC | PRN

## 2023-07-08 MED ORDER — ROPIVACAINE HCL 5 MG/ML IJ SOLN
INTRAMUSCULAR | Status: DC | PRN
  Administered 2023-07-08: 20 mL via PERINEURAL

## 2023-07-08 MED ORDER — TRANEXAMIC ACID-NACL 1000-0.7 MG/100ML-% IV SOLN
INTRAVENOUS | Status: DC | PRN
Start: 2023-07-08 — End: 2023-07-08
  Administered 2023-07-08: 1000 mg via INTRAVENOUS

## 2023-07-08 MED ORDER — LACTATED RINGERS IV SOLN: INTRAVENOUS | Status: DC

## 2023-07-08 MED ORDER — LOSARTAN POTASSIUM-HCTZ 100-12.5 MG PO TABS: 1.0000 | ORAL_TABLET | Freq: Every morning | ORAL | Status: DC

## 2023-07-08 MED ORDER — POVIDONE-IODINE 10 % EX SWAB: 2.0000 | Freq: Once | CUTANEOUS | Status: DC

## 2023-07-08 MED ORDER — BUPIVACAINE LIPOSOME 1.3 % IJ SUSP
INTRAMUSCULAR | Status: AC
  Filled 2023-07-08: qty 20

## 2023-07-08 MED ORDER — METOCLOPRAMIDE HCL 5 MG/ML IJ SOLN: 5.0000 mg | Freq: Three times a day (TID) | INTRAMUSCULAR | Status: DC | PRN

## 2023-07-08 MED ORDER — SODIUM CHLORIDE 0.9 % IV SOLN: INTRAVENOUS | Status: DC

## 2023-07-08 MED ORDER — DEXAMETHASONE SODIUM PHOSPHATE 10 MG/ML IJ SOLN
INTRAMUSCULAR | Status: AC
  Filled 2023-07-08: qty 1

## 2023-07-08 MED ORDER — METHOCARBAMOL 1000 MG/10ML IJ SOLN: 500.0000 mg | Freq: Four times a day (QID) | INTRAMUSCULAR | Status: DC | PRN

## 2023-07-08 MED ORDER — CEFAZOLIN SODIUM-DEXTROSE 2-4 GM/100ML-% IV SOLN
2.0000 g | Freq: Four times a day (QID) | INTRAVENOUS | Status: AC
  Administered 2023-07-08 (×2): 2 g via INTRAVENOUS
  Filled 2023-07-08 (×2): qty 100

## 2023-07-08 MED ORDER — TRAMADOL HCL 50 MG PO TABS
50.0000 mg | ORAL_TABLET | Freq: Four times a day (QID) | ORAL | Status: DC | PRN
  Administered 2023-07-08 – 2023-07-09 (×2): 50 mg via ORAL
  Filled 2023-07-08 (×2): qty 1

## 2023-07-08 MED ORDER — MIDAZOLAM HCL 2 MG/2ML IJ SOLN: 2.0000 mg | Freq: Once | INTRAMUSCULAR | Status: DC

## 2023-07-08 MED ORDER — ROCURONIUM BROMIDE 10 MG/ML (PF) SYRINGE
PREFILLED_SYRINGE | INTRAVENOUS | Status: AC
  Filled 2023-07-08: qty 10

## 2023-07-08 MED ORDER — DOCUSATE SODIUM 100 MG PO CAPS
100.0000 mg | ORAL_CAPSULE | Freq: Two times a day (BID) | ORAL | Status: DC
  Administered 2023-07-08 – 2023-07-10 (×4): 100 mg via ORAL
  Filled 2023-07-08 (×4): qty 1

## 2023-07-08 MED ORDER — LOSARTAN POTASSIUM 50 MG PO TABS
100.0000 mg | ORAL_TABLET | Freq: Every day | ORAL | Status: DC
  Administered 2023-07-10: 100 mg via ORAL
  Filled 2023-07-08 (×2): qty 2

## 2023-07-08 MED ORDER — CEFAZOLIN SODIUM-DEXTROSE 2-4 GM/100ML-% IV SOLN
2.0000 g | INTRAVENOUS | Status: AC
  Administered 2023-07-08: 2 g via INTRAVENOUS
  Filled 2023-07-08: qty 100

## 2023-07-08 MED ORDER — TAMOXIFEN CITRATE 10 MG PO TABS
20.0000 mg | ORAL_TABLET | Freq: Every morning | ORAL | Status: DC
  Administered 2023-07-09 – 2023-07-10 (×2): 20 mg via ORAL
  Filled 2023-07-08 (×2): qty 2

## 2023-07-08 MED ORDER — POLYETHYLENE GLYCOL 3350 17 G PO PACK: 17.0000 g | PACK | Freq: Every day | ORAL | Status: DC | PRN

## 2023-07-08 MED ORDER — SODIUM CHLORIDE (PF) 0.9 % IJ SOLN
INTRAMUSCULAR | Status: AC
  Filled 2023-07-08: qty 50

## 2023-07-08 MED ORDER — MENTHOL 3 MG MT LOZG: 1.0000 | LOZENGE | OROMUCOSAL | Status: DC | PRN

## 2023-07-08 MED ORDER — PHENYLEPHRINE HCL (PRESSORS) 10 MG/ML IV SOLN
INTRAVENOUS | Status: AC
  Filled 2023-07-08: qty 1

## 2023-07-08 MED ORDER — SODIUM CHLORIDE (PF) 0.9 % IJ SOLN
INTRAMUSCULAR | Status: AC
  Filled 2023-07-08: qty 10

## 2023-07-08 MED ORDER — PANTOPRAZOLE SODIUM 40 MG PO TBEC
80.0000 mg | DELAYED_RELEASE_TABLET | Freq: Every day | ORAL | Status: DC
  Administered 2023-07-09 – 2023-07-10 (×2): 80 mg via ORAL
  Filled 2023-07-08 (×2): qty 2

## 2023-07-08 MED ORDER — STERILE WATER FOR IRRIGATION IR SOLN
Status: DC | PRN
  Administered 2023-07-08: 1000 mL

## 2023-07-08 MED ORDER — DEXMEDETOMIDINE HCL IN NACL 80 MCG/20ML IV SOLN
INTRAVENOUS | Status: AC
  Filled 2023-07-08: qty 20

## 2023-07-08 MED ORDER — ACETAMINOPHEN 500 MG PO TABS
1000.0000 mg | ORAL_TABLET | Freq: Four times a day (QID) | ORAL | Status: AC
  Administered 2023-07-08 – 2023-07-09 (×4): 1000 mg via ORAL
  Filled 2023-07-08 (×4): qty 2

## 2023-07-08 MED ORDER — PROPOFOL 1000 MG/100ML IV EMUL
INTRAVENOUS | Status: AC
  Filled 2023-07-08: qty 100

## 2023-07-08 MED ORDER — SODIUM CHLORIDE 0.9 % IR SOLN
Status: DC | PRN
  Administered 2023-07-08: 1000 mL

## 2023-07-08 MED ORDER — ONDANSETRON HCL 4 MG/2ML IJ SOLN
INTRAMUSCULAR | Status: AC
  Filled 2023-07-08: qty 2

## 2023-07-08 MED ORDER — OXYCODONE HCL 5 MG PO TABS
5.0000 mg | ORAL_TABLET | Freq: Once | ORAL | Status: AC | PRN
  Administered 2023-07-08: 5 mg via ORAL

## 2023-07-08 MED ORDER — HYDROCHLOROTHIAZIDE 12.5 MG PO TABS
12.5000 mg | ORAL_TABLET | Freq: Every day | ORAL | Status: DC
  Administered 2023-07-10: 12.5 mg via ORAL
  Filled 2023-07-08 (×2): qty 1

## 2023-07-08 MED ORDER — LEVOTHYROXINE SODIUM 50 MCG PO TABS
50.0000 ug | ORAL_TABLET | Freq: Every day | ORAL | Status: DC
Start: 2023-07-09 — End: 2023-07-10
  Administered 2023-07-09 – 2023-07-10 (×2): 50 ug via ORAL
  Filled 2023-07-08 (×2): qty 1

## 2023-07-08 MED ORDER — ONDANSETRON HCL 4 MG/2ML IJ SOLN: 4.0000 mg | Freq: Four times a day (QID) | INTRAMUSCULAR | Status: DC | PRN

## 2023-07-08 MED ORDER — DIPHENHYDRAMINE HCL 12.5 MG/5ML PO ELIX
12.5000 mg | ORAL_SOLUTION | ORAL | Status: DC | PRN
  Administered 2023-07-09: 25 mg via ORAL
  Filled 2023-07-08 (×2): qty 10

## 2023-07-08 MED ORDER — FLEET ENEMA RE ENEM: 1.0000 | ENEMA | Freq: Once | RECTAL | Status: DC | PRN

## 2023-07-08 MED ORDER — OXYCODONE HCL 5 MG PO TABS
5.0000 mg | ORAL_TABLET | ORAL | Status: DC | PRN
  Administered 2023-07-08: 5 mg via ORAL
  Administered 2023-07-09 – 2023-07-10 (×6): 10 mg via ORAL
  Filled 2023-07-08: qty 1
  Filled 2023-07-08 (×6): qty 2

## 2023-07-08 MED ORDER — FENTANYL CITRATE PF 50 MCG/ML IJ SOSY
25.0000 ug | PREFILLED_SYRINGE | INTRAMUSCULAR | Status: DC | PRN
  Administered 2023-07-08 (×2): 25 ug via INTRAVENOUS
  Administered 2023-07-08 (×2): 50 ug via INTRAVENOUS

## 2023-07-08 MED ORDER — ATORVASTATIN CALCIUM 20 MG PO TABS
20.0000 mg | ORAL_TABLET | Freq: Every day | ORAL | Status: DC
  Administered 2023-07-09 – 2023-07-10 (×2): 20 mg via ORAL
  Filled 2023-07-08 (×2): qty 1

## 2023-07-08 MED ORDER — FENTANYL CITRATE PF 50 MCG/ML IJ SOSY
PREFILLED_SYRINGE | INTRAMUSCULAR | Status: AC
Start: 2023-07-08 — End: 2023-07-09
  Filled 2023-07-08: qty 2

## 2023-07-08 MED ORDER — DEXAMETHASONE SODIUM PHOSPHATE 10 MG/ML IJ SOLN: 8.0000 mg | Freq: Once | INTRAMUSCULAR | Status: DC

## 2023-07-08 MED ORDER — ORAL CARE MOUTH RINSE: 15.0000 mL | Freq: Once | OROMUCOSAL | Status: AC

## 2023-07-08 MED ORDER — ONDANSETRON HCL 4 MG/2ML IJ SOLN
INTRAMUSCULAR | Status: DC | PRN
  Administered 2023-07-08: 4 mg via INTRAVENOUS

## 2023-07-08 MED ORDER — DONEPEZIL HCL 10 MG PO TABS
5.0000 mg | ORAL_TABLET | Freq: Every day | ORAL | Status: DC
  Administered 2023-07-08 – 2023-07-09 (×2): 5 mg via ORAL
  Filled 2023-07-08 (×2): qty 1

## 2023-07-08 MED ORDER — SODIUM CHLORIDE (PF) 0.9 % IJ SOLN
INTRAMUSCULAR | Status: DC | PRN
  Administered 2023-07-08: 80 mL

## 2023-07-08 MED ORDER — ACETAMINOPHEN 325 MG PO TABS
325.0000 mg | ORAL_TABLET | Freq: Four times a day (QID) | ORAL | Status: DC | PRN
  Administered 2023-07-10: 650 mg via ORAL
  Filled 2023-07-08: qty 2

## 2023-07-08 MED ORDER — MORPHINE SULFATE (PF) 2 MG/ML IV SOLN
1.0000 mg | INTRAVENOUS | Status: DC | PRN
  Administered 2023-07-09: 2 mg via INTRAVENOUS
  Filled 2023-07-08: qty 1

## 2023-07-08 MED ORDER — PHENYLEPHRINE 80 MCG/ML (10ML) SYRINGE FOR IV PUSH (FOR BLOOD PRESSURE SUPPORT)
PREFILLED_SYRINGE | INTRAVENOUS | Status: AC
  Filled 2023-07-08: qty 10

## 2023-07-08 MED ORDER — RIVAROXABAN 10 MG PO TABS
10.0000 mg | ORAL_TABLET | Freq: Every day | ORAL | Status: DC
  Administered 2023-07-09 – 2023-07-10 (×2): 10 mg via ORAL
  Filled 2023-07-08 (×2): qty 1

## 2023-07-08 MED ORDER — FENTANYL CITRATE PF 50 MCG/ML IJ SOSY
PREFILLED_SYRINGE | INTRAMUSCULAR | Status: AC
  Filled 2023-07-08: qty 1

## 2023-07-08 MED ORDER — POTASSIUM CHLORIDE ER 10 MEQ PO TBCR
10.0000 meq | EXTENDED_RELEASE_TABLET | Freq: Two times a day (BID) | ORAL | Status: DC
  Administered 2023-07-08 – 2023-07-10 (×4): 10 meq via ORAL
  Filled 2023-07-08 (×9): qty 1

## 2023-07-08 MED ORDER — OXYCODONE HCL 5 MG/5ML PO SOLN: 5.0000 mg | Freq: Once | ORAL | Status: AC | PRN

## 2023-07-08 MED ORDER — FENTANYL CITRATE (PF) 100 MCG/2ML IJ SOLN
INTRAMUSCULAR | Status: AC
Start: 2023-07-08 — End: ?
  Filled 2023-07-08: qty 2

## 2023-07-08 MED ORDER — CLONIDINE HCL (ANALGESIA) 100 MCG/ML EP SOLN
EPIDURAL | Status: DC | PRN
  Administered 2023-07-08: 50 ug

## 2023-07-08 MED ORDER — 0.9 % SODIUM CHLORIDE (POUR BTL) OPTIME
TOPICAL | Status: DC | PRN
  Administered 2023-07-08: 1000 mL

## 2023-07-08 MED ORDER — METOCLOPRAMIDE HCL 5 MG PO TABS: 5.0000 mg | ORAL_TABLET | Freq: Three times a day (TID) | ORAL | Status: DC | PRN

## 2023-07-08 MED ORDER — PHENYLEPHRINE HCL-NACL 20-0.9 MG/250ML-% IV SOLN
INTRAVENOUS | Status: DC | PRN
  Administered 2023-07-08: 40 ug/min via INTRAVENOUS

## 2023-07-08 MED ORDER — DEXAMETHASONE SODIUM PHOSPHATE 10 MG/ML IJ SOLN
10.0000 mg | Freq: Once | INTRAMUSCULAR | Status: AC
  Administered 2023-07-09: 10 mg via INTRAVENOUS
  Filled 2023-07-08: qty 1

## 2023-07-08 MED ORDER — ACETAMINOPHEN 10 MG/ML IV SOLN
1000.0000 mg | Freq: Four times a day (QID) | INTRAVENOUS | Status: DC
  Filled 2023-07-08: qty 100

## 2023-07-08 MED ORDER — SODIUM CHLORIDE 0.9 % IV SOLN: INTRAVENOUS | Status: DC | PRN

## 2023-07-08 MED ORDER — AMLODIPINE BESYLATE 5 MG PO TABS
5.0000 mg | ORAL_TABLET | Freq: Every morning | ORAL | Status: DC
  Administered 2023-07-10: 5 mg via ORAL
  Filled 2023-07-08 (×2): qty 1

## 2023-07-08 MED ORDER — FENTANYL CITRATE PF 50 MCG/ML IJ SOSY
100.0000 ug | PREFILLED_SYRINGE | Freq: Once | INTRAMUSCULAR | Status: AC
  Administered 2023-07-08: 50 ug via INTRAVENOUS
  Filled 2023-07-08: qty 2

## 2023-07-08 MED ORDER — SUGAMMADEX SODIUM 200 MG/2ML IV SOLN
INTRAVENOUS | Status: DC | PRN
  Administered 2023-07-08: 150 mg via INTRAVENOUS

## 2023-07-08 MED ORDER — DEXAMETHASONE SODIUM PHOSPHATE 10 MG/ML IJ SOLN
INTRAMUSCULAR | Status: DC | PRN
  Administered 2023-07-08: 8 mg via INTRAVENOUS

## 2023-07-08 MED ORDER — LIDOCAINE HCL (PF) 2 % IJ SOLN
INTRAMUSCULAR | Status: AC
  Filled 2023-07-08: qty 5

## 2023-07-08 MED ORDER — ONDANSETRON HCL 4 MG/2ML IJ SOLN: 4.0000 mg | Freq: Once | INTRAMUSCULAR | Status: DC | PRN

## 2023-07-08 MED ORDER — PROPOFOL 10 MG/ML IV BOLUS
INTRAVENOUS | Status: DC | PRN
  Administered 2023-07-08: 100 mg via INTRAVENOUS
  Administered 2023-07-08: 20 mg via INTRAVENOUS

## 2023-07-08 SURGICAL SUPPLY — 45 items
ATTUNE MED DOME PAT 38 KNEE (Knees) IMPLANT
ATTUNE PS FEM LT SZ 5 CEM KNEE (Femur) IMPLANT
ATTUNE PSRP INSR SZ 5 10M KNEE (Insert) IMPLANT
BAG COUNTER SPONGE SURGICOUNT (BAG) IMPLANT
BAG ZIPLOCK 12X15 (MISCELLANEOUS) ×1 IMPLANT
BASE TIBIAL ROT PLAT SZ 5 KNEE (Knees) IMPLANT
BLADE SAG 18X100X1.27 (BLADE) ×1 IMPLANT
BLADE SAW SGTL 11.0X1.19X90.0M (BLADE) ×1 IMPLANT
BNDG ELASTIC 6INX 5YD STR LF (GAUZE/BANDAGES/DRESSINGS) ×1 IMPLANT
BOWL SMART MIX CTS (DISPOSABLE) ×1 IMPLANT
CEMENT HV SMART SET (Cement) ×2 IMPLANT
COVER SURGICAL LIGHT HANDLE (MISCELLANEOUS) ×1 IMPLANT
CUFF TRNQT CYL 34X4.125X (TOURNIQUET CUFF) ×1 IMPLANT
DERMABOND ADVANCED .7 DNX12 (GAUZE/BANDAGES/DRESSINGS) ×1 IMPLANT
DRAPE U-SHAPE 47X51 STRL (DRAPES) ×1 IMPLANT
DRSG AQUACEL AG ADV 3.5X10 (GAUZE/BANDAGES/DRESSINGS) ×1 IMPLANT
DURAPREP 26ML APPLICATOR (WOUND CARE) ×1 IMPLANT
ELECT REM PT RETURN 15FT ADLT (MISCELLANEOUS) ×1 IMPLANT
GLOVE BIO SURGEON STRL SZ 6.5 (GLOVE) IMPLANT
GLOVE BIO SURGEON STRL SZ7 (GLOVE) IMPLANT
GLOVE BIO SURGEON STRL SZ8 (GLOVE) ×1 IMPLANT
GLOVE BIOGEL PI IND STRL 7.0 (GLOVE) IMPLANT
GLOVE BIOGEL PI IND STRL 8 (GLOVE) ×1 IMPLANT
GOWN STRL REUS W/ TWL LRG LVL3 (GOWN DISPOSABLE) ×1 IMPLANT
HOLDER FOLEY CATH W/STRAP (MISCELLANEOUS) IMPLANT
IMMOBILIZER KNEE 20 (SOFTGOODS) ×1 IMPLANT
IMMOBILIZER KNEE 20 THIGH 36 (SOFTGOODS) ×1 IMPLANT
KIT TURNOVER KIT A (KITS) IMPLANT
MANIFOLD NEPTUNE II (INSTRUMENTS) ×1 IMPLANT
NS IRRIG 1000ML POUR BTL (IV SOLUTION) ×1 IMPLANT
PACK TOTAL KNEE CUSTOM (KITS) ×1 IMPLANT
PADDING CAST COTTON 6X4 STRL (CAST SUPPLIES) ×2 IMPLANT
PIN STEINMAN FIXATION KNEE (PIN) IMPLANT
PROTECTOR NERVE ULNAR (MISCELLANEOUS) ×1 IMPLANT
SET HNDPC FAN SPRY TIP SCT (DISPOSABLE) ×1 IMPLANT
SUT MNCRL AB 4-0 PS2 18 (SUTURE) ×1 IMPLANT
SUT STRATAFIX 0 PDS 27 VIOLET (SUTURE) ×1 IMPLANT
SUT STRATAFIX PDS+ 0 24IN (SUTURE) IMPLANT
SUT VIC AB 2-0 CT1 TAPERPNT 27 (SUTURE) ×3 IMPLANT
SUTURE STRATFX 0 PDS 27 VIOLET (SUTURE) ×1 IMPLANT
TIBIAL BASE ROT PLAT SZ 5 KNEE (Knees) ×1 IMPLANT
TRAY FOLEY MTR SLVR 16FR STAT (SET/KITS/TRAYS/PACK) IMPLANT
TUBE SUCTION HIGH CAP CLEAR NV (SUCTIONS) ×1 IMPLANT
WATER STERILE IRR 1000ML POUR (IV SOLUTION) ×2 IMPLANT
WRAP KNEE MAXI GEL POST OP (GAUZE/BANDAGES/DRESSINGS) ×1 IMPLANT

## 2023-07-08 NOTE — Discharge Instructions (Addendum)
 Frank Aluisio, MD Total Joint Specialist [facility] [address] [place], [place] [address] [phone]  TOTAL KNEE REPLACEMENT POSTOPERATIVE DIRECTIONS    Knee Rehabilitation, Guidelines Following Surgery  Results after knee surgery are often greatly improved when you follow the exercise, range of motion and muscle strengthening exercises prescribed by your doctor. Safety measures are also important to protect the knee from further injury. If any of these exercises cause you to have increased pain or swelling in your knee joint, decrease the amount until you are comfortable again and slowly increase them. If you have problems or questions, call your caregiver or physical therapist for advice.   BLOOD CLOT PREVENTION Take a 10 mg Xarelto once a day for three weeks following surgery. Then take an 81 mg Aspirin once a day for three weeks. Then discontinue Aspirin. You may resume your vitamins/supplements once you have discontinued the Xarelto. Do not take any NSAIDs (Advil, Aleve, Ibuprofen, Meloxicam, etc.) until you have discontinued the Xarelto.   HOME CARE INSTRUCTIONS  Remove items at home which could result in a fall. This includes throw rugs or furniture in walking pathways.  ICE to the affected knee as much as tolerated. Icing helps control swelling. If the swelling is well controlled you will be more comfortable and rehab easier. Continue to use ice on the knee for pain and swelling from surgery. You may notice swelling that will progress down to the foot and ankle. This is normal after surgery. Elevate the leg when you are not up walking on it.    Continue to use the breathing machine which will help keep your temperature down. It is common for your temperature to cycle up and down following surgery, especially at night when you are not up moving around and exerting yourself. The breathing machine keeps your lungs expanded and your temperature  down. Do not place pillow under the operative knee, focus on keeping the knee straight while resting  DIET You may resume your previous home diet once you are discharged from the hospital.  DRESSING / WOUND CARE / SHOWERING Keep your bulky bandage on for 2 days. On the third post-operative day you may remove the Ace bandage and gauze. There is a waterproof adhesive bandage on your skin which will stay in place until your first follow-up appointment. Once you remove this you will not need to place another bandage You may begin showering 3 days following surgery, but do not submerge the incision under water.  ACTIVITY For the first 5 days, the key is rest and control of pain and swelling Do your home exercises twice a day starting on post-operative day 3. On the days you go to physical therapy, just do the home exercises once that day. You should rest, ice and elevate the leg for 50 minutes out of every hour. Get up and walk/stretch for 10 minutes per hour. After 5 days you can increase your activity slowly as tolerated. Walk with your walker as instructed. Use the walker until you are comfortable transitioning to a cane. Walk with the cane in the opposite hand of the operative leg. You may discontinue the cane once you are comfortable and walking steadily. Avoid periods of inactivity such as sitting longer than an hour when not asleep. This helps prevent blood clots.  You may discontinue the knee immobilizer once you are able to perform a straight leg raise while lying down. You may resume a sexual relationship in one month or   when given the OK by your doctor.  You may return to work once you are cleared by your doctor.  Do not drive a car for 6 weeks or until released by your surgeon.  Do not drive while taking narcotics.  TED HOSE STOCKINGS Wear the elastic stockings on both legs for three weeks following surgery during the day. You may remove them at night for sleeping.  WEIGHT  BEARING Weight bearing as tolerated with assist device (walker, cane, etc) as directed, use it as long as suggested by your surgeon or therapist, typically at least 4-6 weeks.  POSTOPERATIVE CONSTIPATION PROTOCOL Constipation - defined medically as fewer than three stools per week and severe constipation as less than one stool per week.  One of the most common issues patients have following surgery is constipation.  Even if you have a regular bowel pattern at home, your normal regimen is likely to be disrupted due to multiple reasons following surgery.  Combination of anesthesia, postoperative narcotics, change in appetite and fluid intake all can affect your bowels.  In order to avoid complications following surgery, here are some recommendations in order to help you during your recovery period.  Colace (docusate) - Pick up an over-the-counter form of Colace or another stool softener and take twice a day as long as you are requiring postoperative pain medications.  Take with a full glass of water daily.  If you experience loose stools or diarrhea, hold the colace until you stool forms back up. If your symptoms do not get better within 1 week or if they get worse, check with your doctor. Dulcolax (bisacodyl) - Pick up over-the-counter and take as directed by the product packaging as needed to assist with the movement of your bowels.  Take with a full glass of water.  Use this product as needed if not relieved by Colace only.  MiraLax (polyethylene glycol) - Pick up over-the-counter to have on hand. MiraLax is a solution that will increase the amount of water in your bowels to assist with bowel movements.  Take as directed and can mix with a glass of water, juice, soda, coffee, or tea. Take if you go more than two days without a movement. Do not use MiraLax more than once per day. Call your doctor if you are still constipated or irregular after using this medication for 7 days in a row.  If you continue  to have problems with postoperative constipation, please contact the office for further assistance and recommendations.  If you experience "the worst abdominal pain ever" or develop nausea or vomiting, please contact the office immediatly for further recommendations for treatment.  ITCHING If you experience itching with your medications, try taking only a single pain pill, or even half a pain pill at a time.  You can also use Benadryl over the counter for itching or also to help with sleep.   MEDICATIONS See your medication summary on the "After Visit Summary" that the nursing staff will review with you prior to discharge.  You may have some home medications which will be placed on hold until you complete the course of blood thinner medication.  It is important for you to complete the blood thinner medication as prescribed by your surgeon.  Continue your approved medications as instructed at time of discharge.  PRECAUTIONS If you experience chest pain or shortness of breath - call 911 immediately for transfer to the hospital emergency department.  If you develop a fever greater that 101 F, purulent   drainage from wound, increased redness or drainage from wound, foul odor from the wound/dressing, or calf pain - CONTACT YOUR SURGEON.                                                   FOLLOW-UP APPOINTMENTS Make sure you keep all of your appointments after your operation with your surgeon and caregivers. You should call the office at the above phone number and make an appointment for approximately two weeks after the date of your surgery or on the date instructed by your surgeon outlined in the "After Visit Summary".  RANGE OF MOTION AND STRENGTHENING EXERCISES  Rehabilitation of the knee is important following a knee injury or an operation. After just a few days of immobilization, the muscles of the thigh which control the knee become weakened and shrink (atrophy). Knee exercises are designed to build up  the tone and strength of the thigh muscles and to improve knee motion. Often times heat used for twenty to thirty minutes before working out will loosen up your tissues and help with improving the range of motion but do not use heat for the first two weeks following surgery. These exercises can be done on a training (exercise) mat, on the floor, on a table or on a bed. Use what ever works the best and is most comfortable for you Knee exercises include:  Leg Lifts - While your knee is still immobilized in a splint or cast, you can do straight leg raises. Lift the leg to 60 degrees, hold for 3 sec, and slowly lower the leg. Repeat 10-20 times 2-3 times daily. Perform this exercise against resistance later as your knee gets better.  Quad and Hamstring Sets - Tighten up the muscle on the front of the thigh (Quad) and hold for 5-10 sec. Repeat this 10-20 times hourly. Hamstring sets are done by pushing the foot backward against an object and holding for 5-10 sec. Repeat as with quad sets.  Leg Slides: Lying on your back, slowly slide your foot toward your buttocks, bending your knee up off the floor (only go as far as is comfortable). Then slowly slide your foot back down until your leg is flat on the floor again. Angel Wings: Lying on your back spread your legs to the side as far apart as you can without causing discomfort.  A rehabilitation program following serious knee injuries can speed recovery and prevent re-injury in the future due to weakened muscles. Contact your doctor or a physical therapist for more information on knee rehabilitation.   POST-OPERATIVE OPIOID TAPER INSTRUCTIONS: It is important to wean off of your opioid medication as soon as possible. If you do not need pain medication after your surgery it is ok to stop day one. Opioids include: Codeine, Hydrocodone(Norco, Vicodin), Oxycodone(Percocet, oxycontin) and hydromorphone amongst others.  Long term and even short term use of opiods can  cause: Increased pain response Dependence Constipation Depression Respiratory depression And more.  Withdrawal symptoms can include Flu like symptoms Nausea, vomiting And more Techniques to manage these symptoms Hydrate well Eat regular healthy meals Stay active Use relaxation techniques(deep breathing, meditating, yoga) Do Not substitute Alcohol to help with tapering If you have been on opioids for less than two weeks and do not have pain than it is ok to stop all together.  Plan to   wean off of opioids This plan should start within one week post op of your joint replacement. Maintain the same interval or time between taking each dose and first decrease the dose.  Cut the total daily intake of opioids by one tablet each day Next start to increase the time between doses. The last dose that should be eliminated is the evening dose.   IF YOU ARE TRANSFERRED TO A SKILLED REHAB FACILITY If the patient is transferred to a skilled rehab facility following release from the hospital, a list of the current medications will be sent to the facility for the patient to continue.  When discharged from the skilled rehab facility, please have the facility set up the patient's Home Health Physical Therapy prior to being released. Also, the skilled facility will be responsible for providing the patient with their medications at time of release from the facility to include their pain medication, the muscle relaxants, and their blood thinner medication. If the patient is still at the rehab facility at time of the two week follow up appointment, the skilled rehab facility will also need to assist the patient in arranging follow up appointment in our office and any transportation needs.  MAKE SURE YOU:  Understand these instructions.  Get help right away if you are not doing well or get worse.   DENTAL ANTIBIOTICS:  In most cases prophylactic antibiotics for Dental procdeures after total joint surgery are  not necessary.  Exceptions are as follows:  1. History of prior total joint infection  2. Severely immunocompromised (Organ Transplant, cancer chemotherapy, Rheumatoid biologic medications such as Humera)  3. Poorly controlled diabetes (A1C &gt; 8.0, blood glucose over 200)  If you have one of these conditions, contact your surgeon for an antibiotic prescription, prior to your dental procedure.    Pick up stool softner and laxative for home use following surgery while on pain medications. Do not submerge incision under water. Please use good hand washing techniques while changing dressing each day. May shower starting three days after surgery. Please use a clean towel to pat the incision dry following showers. Continue to use ice for pain and swelling after surgery. Do not use any lotions or creams on the incision until instructed by your surgeon.    Information on my medicine - XARELTO (Rivaroxaban)     Why was Xarelto prescribed for you? Xarelto was prescribed for you to reduce the risk of blood clots forming after orthopedic surgery. The medical term for these abnormal blood clots is venous thromboembolism (VTE).  What do you need to know about xarelto ? Take your Xarelto ONCE DAILY at the same time every day. You may take it either with or without food.  If you have difficulty swallowing the tablet whole, you may crush it and mix in applesauce just prior to taking your dose.  Take Xarelto exactly as prescribed by your doctor and DO NOT stop taking Xarelto without talking to the doctor who prescribed the medication.  Stopping without other VTE prevention medication to take the place of Xarelto may increase your risk of developing a clot.  After discharge, you should have regular check-up appointments with your healthcare provider that is prescribing your Xarelto.    What do you do if you miss a dose? If you miss a dose, take it as soon as you remember on the same  day then continue your regularly scheduled once daily regimen the next day. Do not take two doses of Xarelto on the   same day.   Important Safety Information A possible side effect of Xarelto is bleeding. You should call your healthcare provider right away if you experience any of the following: Bleeding from an injury or your nose that does not stop. Unusual colored urine (red or dark brown) or unusual colored stools (red or black). Unusual bruising for unknown reasons. A serious fall or if you hit your head (even if there is no bleeding).  Some medicines may interact with Xarelto and might increase your risk of bleeding while on Xarelto. To help avoid this, consult your healthcare provider or pharmacist prior to using any new prescription or non-prescription medications, including herbals, vitamins, non-steroidal anti-inflammatory drugs (NSAIDs) and supplements.  This website has more information on Xarelto: www.xarelto.com.   

## 2023-07-08 NOTE — Progress Notes (Signed)
 Orthopedic Tech Progress Note Patient Details:  Emaan Gary [facility] 1940-08-17 [id number]  CPM Left Knee CPM Left Knee: Off Left Knee Flexion (Degrees): 40 Left Knee Extension (Degrees): 10  Post Interventions Patient Tolerated: Well  Darleen Crocker 07/08/2023, 3:57 PM

## 2023-07-08 NOTE — Evaluation (Signed)
 Physical Therapy Evaluation Patient Details Name: [name] MRN: [id number] DOB: Oct 14, 1940 Today's Date: 07/08/2023  History of Present Illness  83 yo female s/p L TKA on 07/08/23. PMH: HTN, HLD, cognitive impairment, memory loss, breast CA  Clinical Impression  Pt is s/p TKA resulting in the deficits listed below (see PT Problem List).  Pt agreeable with encouragement. Min assist to sit EOB and for stand pivot transfer to recliner.  Pt anxious regarding falling and reluctant to mobilize; muti-modal, repetitious and  step by step cues provided for basic mobility tasks. Pt husband expresses concern for getting pt in and out of house to go to OPPT.  Reassured pt and husband regarding home safety and d/c plan. Pt may need additional day(s) in acute setting to meet PT goals and safely transition to home.  Pt will benefit from acute skilled PT to increase their independence and safety with mobility to allow discharge.          If plan is discharge home, recommend the following: A little help with walking and/or transfers;A little help with bathing/dressing/bathroom;Assistance with cooking/housework;Help with stairs or ramp for entrance;Assist for transportation   Can travel by private vehicle        Equipment Recommendations Rolling walker (2 wheels) (youth ht)  Recommendations for Other Services       Functional Status Assessment Patient has had a recent decline in their functional status and demonstrates the ability to make significant improvements in function in a reasonable and predictable amount of time.     Precautions / Restrictions Precautions Precautions: Fall;Knee Restrictions Weight Bearing Restrictions Per Provider Order: No Other Position/Activity Restrictions: WBAT      Mobility  Bed Mobility Overal bed mobility: Needs Assistance Bed Mobility: Supine to Sit     Supine to sit: Min assist     General bed mobility comments: assist to progress LLE off  bed, CGA to elevate trunk, incr time    Transfers Overall transfer level: Needs assistance Equipment used: Rolling walker (2 wheels) Transfers: Sit to/from Stand, Bed to chair/wheelchair/BSC Sit to Stand: Min assist           General transfer comment: cues for hand placement and LLE position; step by step cues for SPT bed to chair    Ambulation/Gait                  Stairs            Wheelchair Mobility     Tilt Bed    Modified Rankin (Stroke Patients Only)       Balance Overall balance assessment: Needs assistance, History of Falls (fell going into house in Oct 2024, most recently) Sitting-balance support: Feet supported, Bilateral upper extremity supported Sitting balance-Leahy Scale: Fair     Standing balance support: Reliant on assistive device for balance, During functional activity Standing balance-Leahy Scale: Poor                               Pertinent Vitals/Pain Pain Assessment Pain Assessment: Faces Faces Pain Scale: Hurts little more Pain Location: L knee Pain Descriptors / Indicators: Discomfort, Sore, Grimacing, Guarding Pain Intervention(s): Limited activity within patient's tolerance, Monitored during session, Premedicated before session, Repositioned    Home Living Family/patient expects to be discharged to:: Private residence Living Arrangements: Spouse/significant other Available Help at Discharge: Family;Available 24 hours/day Type of Home: House Home Access: Stairs to enter Entrance Stairs-Rails: Right Entrance Stairs-Number  of Steps: 3   Home Layout: Able to live on main level with bedroom/bathroom;Laundry or work area in [employer] Equipment: Gilmer Mor - single point      Prior Function               Mobility Comments: pt states she amb with cane; husband states pt does not use cane       Extremity/Trunk Assessment   Upper Extremity Assessment Upper Extremity Assessment: Defer to OT evaluation     Lower Extremity Assessment Lower Extremity Assessment: LLE deficits/detail LLE Deficits / Details: ankle WFL, knee extension and hip flexion 2/5, 25 degree quad lag       Communication   Communication Communication: No apparent difficulties    Cognition Arousal: Alert Behavior During Therapy: Anxious   PT - Cognitive impairments: Memory, History of cognitive impairments                       PT - Cognition Comments: step by step multi-modal cues needed. Following commands: Intact       Cueing Cueing Techniques: Verbal cues, Tactile cues, Gestural cues     General Comments      Exercises Total Joint Exercises Ankle Circles/Pumps: AROM, Both, 5 reps   Assessment/Plan    PT Assessment Patient needs continued PT services  PT Problem List Decreased strength;Decreased range of motion;Decreased activity tolerance;Decreased balance;Decreased knowledge of use of DME;Decreased cognition;Decreased mobility;Pain       PT Treatment Interventions DME instruction;Gait training;Stair training;Functional mobility training;Therapeutic activities;Patient/family education;Therapeutic exercise    PT Goals (Current goals can be found in the Care Plan section)  Acute Rehab PT Goals Patient Stated Goal: not fall and be able to get in and out of house PT Goal Formulation: With patient/family Time For Goal Achievement: 07/28/23 Potential to Achieve Goals: Good    Frequency 7X/week     Co-evaluation               AM-PAC PT "6 Clicks" Mobility  Outcome Measure Help needed turning from your back to your side while in a flat bed without using bedrails?: A Little Help needed moving from lying on your back to sitting on the side of a flat bed without using bedrails?: A Little Help needed moving to and from a bed to a chair (including a wheelchair)?: A Little Help needed standing up from a chair using your arms (e.g., wheelchair or bedside chair)?: A Little Help needed to  walk in hospital room?: A Little Help needed climbing 3-5 steps with a railing? : A Little 6 Click Score: 18    End of Session Equipment Utilized During Treatment: Gait belt;Left knee immobilizer Activity Tolerance: Patient tolerated treatment well Patient left: in chair;with call bell/phone within reach;with chair alarm set;with family/visitor present Nurse Communication: Mobility status PT Visit Diagnosis: Other abnormalities of gait and mobility (R26.89);History of falling (Z91.81)    Time: 1610-9604 PT Time Calculation (min) (ACUTE ONLY): 17 min   Charges:   PT Evaluation $PT Eval Low Complexity: 1 Low   PT General Charges $$ ACUTE PT VISIT: 1 Visit         Leandro Berkowitz, PT  Acute Rehab Dept [facility]) [phone]  07/08/2023   [facility] 07/08/2023, 4:46 PM

## 2023-07-08 NOTE — Anesthesia Procedure Notes (Signed)
 Anesthesia Regional Block: Adductor canal block   Pre-Anesthetic Checklist: , timeout performed,  Correct Patient, Correct Site, Correct Laterality,  Correct Procedure, Correct Position, site marked,  Risks and benefits discussed,  Surgical consent,  Pre-op evaluation,  At surgeon's request and post-op pain management  Laterality: Left  Prep: chloraprep       Needles:  Injection technique: Single-shot  Needle Type: Echogenic Needle     Needle Length: 9cm  Needle Gauge: 21     Additional Needles:   Procedures:,,,, ultrasound used (permanent image in chart),,    Narrative:  Start time: 07/08/2023 9:55 AM End time: 07/08/2023 10:03 AM Injection made incrementally with aspirations every 5 mL.  Performed by: Personally  Anesthesiologist: Collene Schlichter, MD  Additional Notes: No pain on injection. No increased resistance to injection. Injection made in 5cc increments.  Good needle visualization.  Patient tolerated procedure well.

## 2023-07-08 NOTE — Plan of Care (Signed)
   Problem: Coping: Goal: Level of anxiety will decrease Outcome: Progressing   Problem: Pain Managment: Goal: General experience of comfort will improve and/or be controlled Outcome: Progressing   Problem: Safety: Goal: Ability to remain free from injury will improve Outcome: Progressing

## 2023-07-08 NOTE — Anesthesia Procedure Notes (Signed)
 Procedure Name: Intubation Date/Time: 07/08/2023 10:27 AM  Performed by: Micki Riley, CRNAPre-anesthesia Checklist: Patient identified, Emergency Drugs available, Suction available and Patient being monitored Patient Re-evaluated:Patient Re-evaluated prior to induction Oxygen Delivery Method: Circle System Utilized Preoxygenation: Pre-oxygenation with 100% oxygen Induction Type: IV induction Ventilation: Mask ventilation without difficulty Laryngoscope Size: Miller and 2 Grade View: Grade II Tube type: Oral Tube size: 7.0 mm Number of attempts: 1 Airway Equipment and Method: Stylet and Oral airway Placement Confirmation: ETT inserted through vocal cords under direct vision, positive ETCO2 and breath sounds checked- equal and bilateral Secured at: 21 cm Tube secured with: Tape Dental Injury: Teeth and Oropharynx as per pre-operative assessment

## 2023-07-08 NOTE — Care Plan (Signed)
 Ortho Bundle Case Management Note  Patient Details  Name: [name] MRN: [id number] Date of Birth: 1940/07/29   L TKA on 07/08/23  DCP: Home with husband.  DME: RW ordered through [employer]. Has cane.  PT: EO                 DME Arranged:  Walker rolling DME Agency:  [employer]  HH Arranged:    HH Agency:     Additional Comments: Please contact me with any questions of if this plan should need to change.  Despina Pole, [place] [facility] [phone]   07/08/2023, 1:42 PM

## 2023-07-08 NOTE — Op Note (Signed)
 OPERATIVE REPORT-TOTAL KNEE ARTHROPLASTY   Pre-operative diagnosis- Osteoarthritis  Left knee(s)  Post-operative diagnosis- Osteoarthritis Left knee(s)  Procedure-  Left  Total Knee Arthroplasty  Surgeon- Gus Rankin. Tangelia Sanson, MD  Assistant- Weston Brass, PA-C   Anesthesia-   Adductor canal block and general  EBL 25 ml   Drains None  Tourniquet time-  Total Tourniquet Time Documented: Thigh (Left) - 41 minutes Total: Thigh (Left) - 41 minutes     Complications- None  Condition-PACU - hemodynamically stable.   Brief Clinical Note  [name] is a 83 y.o. year old female with end stage OA of her left knee with progressively worsening pain and dysfunction. She has constant pain, with activity and at rest and significant functional deficits with difficulties even with ADLs. She has had extensive non-op management including analgesics, injections of cortisone and viscosupplements, and home exercise program, but remains in significant pain with significant dysfunction. Radiographs show bone on bone arthritis medial and patellofemoral. She presents now for left Total Knee Arthroplasty.     Procedure in detail---   The patient is brought into the operating room and positioned supine on the operating table. After successful administration of   Adductor canal block and general,   a tourniquet is placed high on the  Left thigh(s) and the lower extremity is prepped and draped in the usual sterile fashion. Time out is performed by the operating team and then the  Left lower extremity is wrapped in Esmarch, knee flexed and the tourniquet inflated to 300 mmHg.       A midline incision is made with a ten blade through the subcutaneous tissue to the level of the extensor mechanism. A fresh blade is used to make a medial parapatellar arthrotomy. Soft tissue over the proximal medial tibia is subperiosteally elevated to the joint line with a knife and into the semimembranosus bursa with  a Cobb elevator. Soft tissue over the proximal lateral tibia is elevated with attention being paid to avoiding the patellar tendon on the tibial tubercle. The patella is everted, knee flexed 90 degrees and the ACL and PCL are removed. Findings are bone on bone medial and patellofemoral with large global osteophytes        The drill is used to create a starting hole in the distal femur and the canal is thoroughly irrigated with sterile saline to remove the fatty contents. The 5 degree Left  valgus alignment guide is placed into the femoral canal and the distal femoral cutting block is pinned to remove 9 mm off the distal femur. Resection is made with an oscillating saw.      The tibia is subluxed forward and the menisci are removed. The extramedullary alignment guide is placed referencing proximally at the medial aspect of the tibial tubercle and distally along the second metatarsal axis and tibial crest. The block is pinned to remove 2mm off the more deficient medial  side. Resection is made with an oscillating saw. Size 5is the most appropriate size for the tibia and the proximal tibia is prepared with the modular drill and keel punch for that size.      The femoral sizing guide is placed and size 5 is most appropriate. Rotation is marked off the epicondylar axis and confirmed by creating a rectangular flexion gap at 90 degrees. The size 5 cutting block is pinned in this rotation and the anterior, posterior and chamfer cuts are made with the oscillating saw. The intercondylar block is then placed and that  cut is made.      Trial size 5 tibial component, trial size 5 posterior stabilized femur and a 10  mm posterior stabilized rotating platform insert trial is placed. Full extension is achieved with excellent varus/valgus and anterior/posterior balance throughout full range of motion. The patella is everted and thickness measured to be 24  mm. Free hand resection is taken to 14 mm, a 38 template is placed, lug  holes are drilled, trial patella is placed, and it tracks normally. Osteophytes are removed off the posterior femur with the trial in place. All trials are removed and the cut bone surfaces prepared with pulsatile lavage. Cement is mixed and once ready for implantation, the size 5 tibial implant, size  5 posterior stabilized femoral component, and the size 38 patella are cemented in place and the patella is held with the clamp. The trial insert is placed and the knee held in full extension. The Exparel (20 ml mixed with 60 ml saline) is injected into the extensor mechanism, posterior capsule, medial and lateral gutters and subcutaneous tissues.  All extruded cement is removed and once the cement is hard the permanent 10 mm posterior stabilized rotating platform insert is placed into the tibial tray.      The wound is copiously irrigated with saline solution and the extensor mechanism closed with # 0 Stratofix suture. The tourniquet is released for a total tourniquet time of 41  minutes. Flexion against gravity is 140 degrees and the patella tracks normally. Subcutaneous tissue is closed with 2.0 vicryl and subcuticular with running 4.0 Monocryl. The incision is cleaned and dried and steri-strips and a bulky sterile dressing are applied. The limb is placed into a knee immobilizer and the patient is awakened and transported to recovery in stable condition.      Please note that a surgical assistant was a medical necessity for this procedure in order to perform it in a safe and expeditious manner. Surgical assistant was necessary to retract the ligaments and vital neurovascular structures to prevent injury to them and also necessary for proper positioning of the limb to allow for anatomic placement of the prosthesis.   Gus Rankin [place] Voytko, MD    07/08/2023, 11:22 AM

## 2023-07-08 NOTE — Interval H&P Note (Signed)
 History and Physical Interval Note:  07/08/2023 8:29 AM  [name]  has presented today for surgery, with the diagnosis of Left Knee Ostoearthritis.  The various methods of treatment have been discussed with the patient and family. After consideration of risks, benefits and other options for treatment, the patient has consented to  Procedure(s): ARTHROPLASTY, KNEE, TOTAL (Left) as a surgical intervention.  The patient's history has been reviewed, patient examined, no change in status, stable for surgery.  I have reviewed the patient's chart and labs.  Questions were answered to the patient's satisfaction.     Homero Fellers Lesley Galentine

## 2023-07-08 NOTE — Anesthesia Postprocedure Evaluation (Signed)
 Anesthesia Post Note  Patient: [name]  Procedure(s) Performed: ARTHROPLASTY, KNEE, TOTAL (Left: Knee)     Patient location during evaluation: Nursing Unit Anesthesia Type: General Level of consciousness: awake and alert Pain management: pain level controlled Vital Signs Assessment: post-procedure vital signs reviewed and stable Respiratory status: spontaneous breathing, nonlabored ventilation and respiratory function stable Cardiovascular status: blood pressure returned to baseline and stable Postop Assessment: no apparent nausea or vomiting Anesthetic complications: no   No notable events documented.  Last Vitals:  Vitals:   07/08/23 1502 07/08/23 1536  BP: (!) 152/62 131/64  Pulse: 95 90  Resp: 15 15  Temp:  36.4 C  SpO2: 98% 100%    Last Pain:  Vitals:   07/08/23 1617  TempSrc:   PainSc: 2                  Collene Schlichter

## 2023-07-08 NOTE — Transfer of Care (Signed)
 Immediate Anesthesia Transfer of Care Note  Patient: [name]  Procedure(s) Performed: ARTHROPLASTY, KNEE, TOTAL (Left: Knee)  Patient Location: PACU  Anesthesia Type:General  Level of Consciousness: sedated and responds to stimulation  Airway & Oxygen Therapy: Patient Spontanous Breathing and Patient connected to face mask oxygen  Post-op Assessment: Report given to RN and Post -op Vital signs reviewed and stable  Post vital signs: Reviewed and stable  Last Vitals:  Vitals Value Taken Time  BP 117/47 07/08/23 1146  Temp    Pulse 63 07/08/23 1147  Resp 16 07/08/23 1147  SpO2 100 % 07/08/23 1147  Vitals shown include unfiled device data.  Last Pain:  Vitals:   07/08/23 1005  TempSrc:   PainSc: 0-No pain         Complications: No notable events documented.

## 2023-07-08 NOTE — Progress Notes (Signed)
 Orthopedic Tech Progress Note Patient Details:  [name] [facility] 12-Apr-1941 [id number]  CPM Left Knee CPM Left Knee: On Left Knee Flexion (Degrees): 40 Left Knee Extension (Degrees): 10  Post Interventions Patient Tolerated: Well  Darleen Crocker 07/08/2023, 11:56 AM

## 2023-07-09 ENCOUNTER — Encounter (HOSPITAL_COMMUNITY): Payer: Self-pay | Admitting: Orthopedic Surgery

## 2023-07-09 DIAGNOSIS — Z853 Personal history of malignant neoplasm of breast: Secondary | ICD-10-CM | POA: Diagnosis not present

## 2023-07-09 DIAGNOSIS — I5030 Unspecified diastolic (congestive) heart failure: Secondary | ICD-10-CM | POA: Diagnosis not present

## 2023-07-09 DIAGNOSIS — I130 Hypertensive heart and chronic kidney disease with heart failure and stage 1 through stage 4 chronic kidney disease, or unspecified chronic kidney disease: Secondary | ICD-10-CM | POA: Diagnosis not present

## 2023-07-09 DIAGNOSIS — Z79899 Other long term (current) drug therapy: Secondary | ICD-10-CM | POA: Diagnosis not present

## 2023-07-09 DIAGNOSIS — M1712 Unilateral primary osteoarthritis, left knee: Secondary | ICD-10-CM | POA: Diagnosis not present

## 2023-07-09 DIAGNOSIS — N183 Chronic kidney disease, stage 3 unspecified: Secondary | ICD-10-CM | POA: Diagnosis not present

## 2023-07-09 DIAGNOSIS — E039 Hypothyroidism, unspecified: Secondary | ICD-10-CM | POA: Diagnosis not present

## 2023-07-09 LAB — CBC
HCT: 32.7 % — ABNORMAL LOW (ref 36.0–46.0)
Hemoglobin: 10.4 g/dL — ABNORMAL LOW (ref 12.0–15.0)
MCH: 28 pg (ref 26.0–34.0)
MCHC: 31.8 g/dL (ref 30.0–36.0)
MCV: 87.9 fL (ref 80.0–100.0)
Platelets: 187 10*3/uL (ref 150–400)
RBC: 3.72 MIL/uL — ABNORMAL LOW (ref 3.87–5.11)
RDW: 13.9 % (ref 11.5–15.5)
WBC: 10.6 10*3/uL — ABNORMAL HIGH (ref 4.0–10.5)
nRBC: 0 % (ref 0.0–0.2)

## 2023-07-09 LAB — BASIC METABOLIC PANEL
Anion gap: 8 (ref 5–15)
BUN: 20 mg/dL (ref 8–23)
CO2: 22 mmol/L (ref 22–32)
Calcium: 8.7 mg/dL — ABNORMAL LOW (ref 8.9–10.3)
Chloride: 105 mmol/L (ref 98–111)
Creatinine, Ser: 0.78 mg/dL (ref 0.44–1.00)
GFR, Estimated: >60 mL/min (ref >60)
Glucose, Bld: 149 mg/dL — ABNORMAL HIGH (ref 70–99)
Potassium: 4.7 mmol/L (ref 3.5–5.1)
Sodium: 135 mmol/L (ref 135–145)

## 2023-07-09 NOTE — Progress Notes (Signed)
 Physical Therapy Treatment Patient Details Name: [name] MRN: [id number] DOB: January 08, 1941 Today's Date: 07/09/2023   History of Present Illness 83 yo female s/p L TKA on 07/08/23. PMH: HTN, HLD, cognitive impairment, memory loss, breast CA    PT Comments  Pt able to progress/incr gait distance with encouragement; multi-modal cues needed throughout. Pt threatening to sit in the floor because she was "being forced to walk", +2/chair follow for safety.   Pt will continue to be a fall risk d/t her cognition. Spouse remains concerned about d/c however given that this was an elective surgery the goal will remain mobility, completing stairs safely in preparation for returning home.   Pt not ready/declines attempt to work on stair training today. Will continue to follow, pt will likely need another day to work with PT prior to d/c   If plan is discharge home, recommend the following: A little help with walking and/or transfers;A little help with bathing/dressing/bathroom;Assistance with cooking/housework;Help with stairs or ramp for entrance;Assist for transportation   Can travel by private vehicle        Equipment Recommendations  Rolling walker (2 wheels) (youth)    Recommendations for Other Services       Precautions / Restrictions Precautions Precautions: Fall;Knee Recall of Precautions/Restrictions: Impaired Precaution/Restrictions Comments: reviewed LE position, terminal knee extension with pt/husband; pt is unable to recall knee precautions Required Braces or Orthoses: Knee Immobilizer - Left Knee Immobilizer - Left: Discontinue once straight leg raise with < 10 degree lag Restrictions Weight Bearing Restrictions Per Provider Order: No LLE Weight Bearing Per Provider Order: Weight bearing as tolerated     Mobility  Bed Mobility Overal bed mobility: Needs Assistance Bed Mobility: Sit to Supine       Sit to supine: Min assist   General bed mobility comments:  assist with LLE    Transfers Overall transfer level: Needs assistance Equipment used: Rolling walker (2 wheels) Transfers: Sit to/from Stand, Bed to chair/wheelchair/BSC Sit to Stand: Min assist, Contact guard assist   Step pivot transfers: Min assist       General transfer comment: multi-modal/hand over hand cues for hand placement and LLE position; step by step cues for SPT  chair to bed    Ambulation/Gait Ambulation/Gait assistance: Min assist, +2 safety/equipment Gait Distance (Feet): 40 Feet Assistive device: Rolling walker (2 wheels) Gait Pattern/deviations: Step-to pattern, Decreased stance time - left, Antalgic       General Gait Details: multi-modal cues for gait progression, sequence, safety; pt stating she was "just going to sit on the floor"   Stairs             Wheelchair Mobility     Tilt Bed    Modified Rankin (Stroke Patients Only)       Balance Overall balance assessment: Needs assistance, History of Falls Sitting-balance support: Feet supported, Bilateral upper extremity supported Sitting balance-Leahy Scale: Fair     Standing balance support: Reliant on assistive device for balance, During functional activity Standing balance-Leahy Scale: Poor                              Communication Communication Communication: No apparent difficulties  Cognition Arousal: Alert Behavior During Therapy: Anxious   PT - Cognitive impairments: Memory, History of cognitive impairments, Orientation, Attention, Sequencing, Problem solving, Safety/Judgement   Orientation impairments: Place, Time, Situation  PT - Cognition Comments: absent STM; oriented to self only; Following commands: Impaired Following commands impaired: Only follows one step commands consistently    Cueing Cueing Techniques: Verbal cues, Tactile cues, Gestural cues  Exercises      General Comments        Pertinent Vitals/Pain Pain  Assessment Pain Assessment: Faces Faces Pain Scale: Hurts even more Pain Location: L knee Pain Descriptors / Indicators: Discomfort, Sore, Grimacing, Guarding Pain Intervention(s): Limited activity within patient's tolerance, Monitored during session, Premedicated before session, Repositioned, Ice applied    Home Living                          Prior Function            PT Goals (current goals can now be found in the care plan section) Acute Rehab PT Goals Patient Stated Goal: not fall and be able to get in and out of house PT Goal Formulation: With patient/family Time For Goal Achievement: 07/28/23 Potential to Achieve Goals: Good Progress towards PT goals: Progressing toward goals    Frequency    7X/week      PT Plan      Co-evaluation              AM-PAC PT "6 Clicks" Mobility   Outcome Measure  Help needed turning from your back to your side while in a flat bed without using bedrails?: A Little Help needed moving from lying on your back to sitting on the side of a flat bed without using bedrails?: A Little Help needed moving to and from a bed to a chair (including a wheelchair)?: A Little Help needed standing up from a chair using your arms (e.g., wheelchair or bedside chair)?: A Little Help needed to walk in hospital room?: A Little Help needed climbing 3-5 steps with a railing? : A Little 6 Click Score: 18    End of Session Equipment Utilized During Treatment: Gait belt Activity Tolerance: Patient limited by pain Patient left: in bed;with call bell/phone within reach;with bed alarm set;with family/visitor present   PT Visit Diagnosis: Other abnormalities of gait and mobility (R26.89);History of falling (Z91.81)     Time: 1050-1107 PT Time Calculation (min) (ACUTE ONLY): 17 min  Charges:    $Gait Training: 8-22 mins PT General Charges $$ ACUTE PT VISIT: 1 Visit                     Nimrod Wendt, PT  Acute Rehab [facility])  [phone]  07/09/2023    [facility] 07/09/2023, 11:24 AM

## 2023-07-09 NOTE — Plan of Care (Signed)

## 2023-07-09 NOTE — Care Management Obs Status (Signed)
 MEDICARE OBSERVATION STATUS NOTIFICATION   Patient Details  Name: [name] MRN: [id number] Date of Birth: [birth year]   Medicare Observation Status Notification Given:  Yes    Howell Rucks, RN 07/09/2023, 10:02 AM

## 2023-07-09 NOTE — Progress Notes (Signed)
 Physical Therapy Treatment Patient Details Name: [name] MRN: [id number] DOB: 1940-07-08 Today's Date: 07/09/2023   History of Present Illness 83 yo female s/p L TKA on 07/08/23. PMH: HTN, HLD, cognitive impairment, memory loss, breast CA    PT Comments  Emphasis on incr gait distance/activity tolerance and knee ROM/exercises. Pt demonstrates improved participation and incr activity tolerance. Again, cognition places pt at risk for falls and this not our area of focus in this setting. Next sessions will plan on stair training and  family education to allow safe transition to home    If plan is discharge home, recommend the following: A little help with walking and/or transfers;A little help with bathing/dressing/bathroom;Assistance with cooking/housework;Help with stairs or ramp for entrance;Assist for transportation   Can travel by private vehicle        Equipment Recommendations  Rolling walker (2 wheels) (youth)    Recommendations for Other Services       Precautions / Restrictions Precautions Precautions: Fall;Knee Recall of Precautions/Restrictions: Impaired Precaution/Restrictions Comments: reviewed LE position, terminal knee extension with pt/husband; pt is unable to recall knee precautions Required Braces or Orthoses: Knee Immobilizer - Left Knee Immobilizer - Left: Discontinue once straight leg raise with < 10 degree lag Restrictions Weight Bearing Restrictions Per Provider Order: No LLE Weight Bearing Per Provider Order: Weight bearing as tolerated     Mobility  Bed Mobility Overal bed mobility: Needs Assistance Bed Mobility: Sit to Supine       Sit to supine: Min assist   General bed mobility comments:  (on BSC with NT)    Transfers Overall transfer level: Needs assistance Equipment used: Rolling walker (2 wheels) Transfers: Sit to/from Stand, Bed to chair/wheelchair/BSC Sit to Stand: Min assist, Contact guard assist   Step pivot transfers:  Min assist       General transfer comment: multi-modal  cues for hand placement and LLE position;    Ambulation/Gait Ambulation/Gait assistance: Min assist, +2 safety/equipment Gait Distance (Feet): 60 Feet Assistive device: Rolling walker (2 wheels) Gait Pattern/deviations: Step-to pattern, Decreased stance time - left, Antalgic       General Gait Details: multi-modal cues for gait progression, sequence, safety;   Stairs             Wheelchair Mobility     Tilt Bed    Modified Rankin (Stroke Patients Only)       Balance Overall balance assessment: Needs assistance, History of Falls Sitting-balance support: Feet supported, Bilateral upper extremity supported Sitting balance-Leahy Scale: Fair     Standing balance support: Reliant on assistive device for balance, During functional activity Standing balance-Leahy Scale: Poor                              Communication Communication Communication: No apparent difficulties  Cognition Arousal: Alert Behavior During Therapy: Anxious   PT - Cognitive impairments: Memory, History of cognitive impairments, Orientation, Attention, Sequencing, Problem solving, Safety/Judgement   Orientation impairments: Place, Time, Situation                   PT - Cognition Comments: decr STM; oriented to self and place Following commands: Impaired Following commands impaired: Only follows one step commands consistently    Cueing Cueing Techniques: Verbal cues, Tactile cues, Gestural cues  Exercises Total Joint Exercises Ankle Circles/Pumps: AROM, Both, 5 reps Quad Sets: AROM, Both, 10 reps, Limitations Quad Sets Limitations: falling asleep, difficulty following commands Heel Slides:  AAROM, Left, 10 reps Straight Leg Raises: AROM, Strengthening, AAROM, Left, 5 reps    General Comments        Pertinent Vitals/Pain Pain Assessment Pain Assessment: Faces Faces Pain Scale: Hurts even more Pain Location:  L knee Pain Descriptors / Indicators: Discomfort, Sore, Grimacing, Guarding Pain Intervention(s): Limited activity within patient's tolerance, Monitored during session, Premedicated before session, Repositioned    Home Living                          Prior Function            PT Goals (current goals can now be found in the care plan section) Acute Rehab PT Goals Patient Stated Goal: not fall and be able to get in and out of house PT Goal Formulation: With patient/family Time For Goal Achievement: 07/28/23 Potential to Achieve Goals: Good Progress towards PT goals: Progressing toward goals    Frequency    7X/week      PT Plan      Co-evaluation              AM-PAC PT "6 Clicks" Mobility   Outcome Measure  Help needed turning from your back to your side while in a flat bed without using bedrails?: A Little Help needed moving from lying on your back to sitting on the side of a flat bed without using bedrails?: A Little Help needed moving to and from a bed to a chair (including a wheelchair)?: A Little Help needed standing up from a chair using your arms (e.g., wheelchair or bedside chair)?: A Little Help needed to walk in hospital room?: A Little Help needed climbing 3-5 steps with a railing? : A Little 6 Click Score: 18    End of Session Equipment Utilized During Treatment: Gait belt Activity Tolerance: Patient limited by pain;Patient tolerated treatment well;Patient limited by fatigue Patient left: with call bell/phone within reach;with family/visitor present;in chair;with chair alarm set Nurse Communication: Mobility status PT Visit Diagnosis: Other abnormalities of gait and mobility (R26.89);History of falling (Z91.81)     Time: 9604-5409 PT Time Calculation (min) (ACUTE ONLY): 20 min  Charges:    $Gait Training: 8-22 mins PT General Charges $$ ACUTE PT VISIT: 1 Visit                     .t   [facility] 07/09/2023, 3:37 PM

## 2023-07-09 NOTE — Progress Notes (Signed)
 Subjective: 1 Day Post-Op Procedure(s) (LRB): ARTHROPLASTY, KNEE, TOTAL (Left) Patient reports pain as moderate.   Patient seen in rounds by Dr. Lequita Halt. Patient is doing well other than pain in the left knee. No acute events overnight. Voiding without difficulty. We will continue therapy today.  Objective: Vital signs in last 24 hours: Temp:  [97.5 F (36.4 C)-98.3 F (36.8 C)] 97.8 F (36.6 C) (03/25 0531) Pulse Rate:  [58-95] 90 (03/25 0531) Resp:  [7-19] 17 (03/25 0531) BP: (110-152)/(43-89) 146/56 (03/25 0531) SpO2:  [95 %-100 %] 96 % (03/25 0531)  Intake/Output from previous day:  Intake/Output Summary (Last 24 hours) at 07/09/2023 0835 Last data filed at 07/09/2023 0600 Gross per 24 hour  Intake 1858.24 ml  Output 50 ml  Net 1808.24 ml     Intake/Output this shift: No intake/output data recorded.  Labs: Recent Labs    07/09/23 0338  HGB 10.4*   Recent Labs    07/09/23 0338  WBC 10.6*  RBC 3.72*  HCT 32.7*  PLT 187   Recent Labs    07/09/23 0338  NA 135  K 4.7  CL 105  CO2 22  BUN 20  CREATININE 0.78  GLUCOSE 149*  CALCIUM 8.7*   No results for input(s): "LABPT", "INR" in the last 72 hours.  Exam: General - Patient is Alert and Oriented Extremity - Neurologically intact Neurovascular intact Sensation intact distally Dorsiflexion/Plantar flexion intact Dressing - dressing C/D/I Motor Function - intact, moving foot and toes well on exam.   Past Medical History:  Diagnosis Date   Arthritis    knees   Atrophic vaginitis    Cancer (HCC)    breast cancer - left   Diverticulosis    Endometriosis    Family history of breast cancer    Family history of colon cancer    Heart murmur    as a child only   History of radiation therapy 01/30/16- 03/02/16   Left Breast 50 Gy in 25 fractions.    Hyperlipidemia    Hypertension    Internal hemorrhoids    Osteopenia    Osteoporosis    Personal history of chemotherapy    Personal history of  radiation therapy    Rheumatic fever    age 14   Thyroid disease    Hypothyroid   Ulcer    UTI (lower urinary tract infection)     Assessment/Plan: 1 Day Post-Op Procedure(s) (LRB): ARTHROPLASTY, KNEE, TOTAL (Left) Principal Problem:   OA (osteoarthritis) of knee Active Problems:   Osteoarthritis of left knee  Estimated body mass index is 33.32 kg/m as calculated from the following:   Height as of this encounter: 5' (1.524 m).   Weight as of this encounter: 77.4 kg. Advance diet Up with therapy D/C IV fluids   Patient's anticipated LOS is less than 2 midnights, meeting these requirements: - Lives within 1 hour of care - Has a competent adult at home to recover with post-op recover - NO history of  - Chronic pain requiring opiods  - Diabetes  - Coronary Artery Disease  - Heart failure  - Heart attack  - Stroke  - DVT/VTE  - Cardiac arrhythmia  - Respiratory Failure/COPD  - Renal failure  - Anemia  - Advanced Liver disease  DVT Prophylaxis - Xarelto Weight bearing as tolerated. Continue therapy.  Plan is to go Home after hospital stay. Possible discharge later today, but will more likely be tomorrow based on mobility with PT.  Scheduled  for OPPT on Friday at our location.  Arther Abbott, PA-C Orthopedic Surgery [phone] 07/09/2023, 8:35 AM

## 2023-07-09 NOTE — TOC Transition Note (Signed)
 Transition of Care [facility]) - Discharge Note   Patient Details  Name: [name] MRN: [id number] Date of Birth: 07-Nov-1940  Transition of Care [facility]) CM/SW Contact:  Howell Rucks, RN Phone Number: 07/09/2023, 10:23 AM   Clinical Narrative:   Met with pt and pt's spouse at bedside to review dc therapy and home equipment needs, pt with documented Cognitive Impairment. Spouse confirmed OPPT at EO, [employer] to deliver RW to bedside. MOON explained to patient and patient's spouse, spouse declined to sign, NCM completed form, copy provided to patient/spouse. No TOC needs.     Final next level of care: OP Rehab Barriers to Discharge: No Barriers Identified   Patient Goals and CMS Choice Patient states their goals for this hospitalization and ongoing recovery are:: return home          Discharge Placement                       Discharge Plan and Services Additional resources added to the After Visit Summary for                  DME Arranged: Walker rolling DME Agency: [employer]                  Social Drivers of Health (SDOH) Interventions SDOH Screenings   Food Insecurity: No Food Insecurity (07/08/2023)  Housing: Unknown (07/08/2023)  Transportation Needs: No Transportation Needs (07/08/2023)  Utilities: Not At Risk (07/08/2023)  Alcohol Screen: Low Risk  (03/16/2020)  Depression (PHQ2-9): Medium Risk (08/15/2022)  Financial Resource Strain: Low Risk  (05/22/2022)  Physical Activity: Insufficiently Active (03/17/2021)  Social Connections: Socially Integrated (07/08/2023)  Stress: No Stress Concern Present (05/22/2022)  Tobacco Use: Low Risk  (07/08/2023)     Readmission Risk Interventions    08/24/2022   12:31 PM  Readmission Risk Prevention Plan  Transportation Screening Complete  PCP or Specialist Appt within 5-7 Days Complete  Home Care Screening Complete  Medication Review (RN CM) Referral to Pharmacy

## 2023-07-10 DIAGNOSIS — N183 Chronic kidney disease, stage 3 unspecified: Secondary | ICD-10-CM | POA: Diagnosis not present

## 2023-07-10 DIAGNOSIS — Z79899 Other long term (current) drug therapy: Secondary | ICD-10-CM | POA: Diagnosis not present

## 2023-07-10 DIAGNOSIS — E039 Hypothyroidism, unspecified: Secondary | ICD-10-CM | POA: Diagnosis not present

## 2023-07-10 DIAGNOSIS — I130 Hypertensive heart and chronic kidney disease with heart failure and stage 1 through stage 4 chronic kidney disease, or unspecified chronic kidney disease: Secondary | ICD-10-CM | POA: Diagnosis not present

## 2023-07-10 DIAGNOSIS — I5030 Unspecified diastolic (congestive) heart failure: Secondary | ICD-10-CM | POA: Diagnosis not present

## 2023-07-10 DIAGNOSIS — Z853 Personal history of malignant neoplasm of breast: Secondary | ICD-10-CM | POA: Diagnosis not present

## 2023-07-10 DIAGNOSIS — M1712 Unilateral primary osteoarthritis, left knee: Secondary | ICD-10-CM | POA: Diagnosis not present

## 2023-07-10 LAB — CBC
HCT: 32.8 % — ABNORMAL LOW (ref 36.0–46.0)
Hemoglobin: 10.3 g/dL — ABNORMAL LOW (ref 12.0–15.0)
MCH: 28.4 pg (ref 26.0–34.0)
MCHC: 31.4 g/dL (ref 30.0–36.0)
MCV: 90.4 fL (ref 80.0–100.0)
Platelets: 169 10*3/uL (ref 150–400)
RBC: 3.63 MIL/uL — ABNORMAL LOW (ref 3.87–5.11)
RDW: 14.4 % (ref 11.5–15.5)
WBC: 9.4 10*3/uL (ref 4.0–10.5)
nRBC: 0 % (ref 0.0–0.2)

## 2023-07-10 MED ORDER — RIVAROXABAN 10 MG PO TABS: 10.0000 mg | ORAL_TABLET | Freq: Every day | ORAL | 0 refills | Status: AC

## 2023-07-10 MED ORDER — ONDANSETRON HCL 4 MG PO TABS: 4.0000 mg | ORAL_TABLET | Freq: Four times a day (QID) | ORAL | 0 refills | Status: AC | PRN

## 2023-07-10 MED ORDER — METHOCARBAMOL 500 MG PO TABS: 500.0000 mg | ORAL_TABLET | Freq: Four times a day (QID) | ORAL | 0 refills | Status: AC | PRN

## 2023-07-10 MED ORDER — OXYCODONE HCL 5 MG PO TABS
5.0000 mg | ORAL_TABLET | Freq: Three times a day (TID) | ORAL | 0 refills | Status: AC | PRN
Start: 2023-07-10 — End: ?

## 2023-07-10 MED ORDER — TRAMADOL HCL 50 MG PO TABS
50.0000 mg | ORAL_TABLET | Freq: Four times a day (QID) | ORAL | 0 refills | Status: AC | PRN
Start: 2023-07-10 — End: ?

## 2023-07-10 NOTE — Plan of Care (Signed)

## 2023-07-10 NOTE — Progress Notes (Signed)
 Physical Therapy Treatment Patient Details Name: [name] MRN: [id number] DOB: September 25, 1940 Today's Date: 07/10/2023   History of Present Illness 83 yo female s/p L TKA on 07/08/23. PMH: HTN, HLD, cognitive impairment, memory loss, breast CA    PT Comments  Pt continues to progress; spouse present for session--educated on stairs/assist/use of gait belt/HEP and mobility as below; pt is ready to d/c from acute setting, will need continued 24hr assist/supervision at home d/t baseline cognition deficits    If plan is discharge home, recommend the following: A little help with walking and/or transfers;A little help with bathing/dressing/bathroom;Assistance with cooking/housework;Help with stairs or ramp for entrance;Assist for transportation   Can travel by private vehicle        Equipment Recommendations  Rolling walker (2 wheels) (youth)    Recommendations for Other Services       Precautions / Restrictions Precautions Precautions: Fall;Knee Recall of Precautions/Restrictions: Impaired Precaution/Restrictions Comments: reviewed LE position, terminal knee extension with pt/husband; pt is unable to recall knee precautions Required Braces or Orthoses: Knee Immobilizer - Left Knee Immobilizer - Left: Discontinue once straight leg raise with < 10 degree lag Restrictions Weight Bearing Restrictions Per Provider Order: No LLE Weight Bearing Per Provider Order: Weight bearing as tolerated     Mobility  Bed Mobility               General bed mobility comments: in recliner    Transfers Overall transfer level: Needs assistance Equipment used: Rolling walker (2 wheels) Transfers: Sit to/from Stand, Bed to chair/wheelchair/BSC Sit to Stand: Contact guard assist   Step pivot transfers: Min assist, Contact guard assist       General transfer comment: multi-modal  cues for hand placement, LLE  position and sequence    Ambulation/Gait Ambulation/Gait assistance: Min  assist, Contact guard assist Gait Distance (Feet): 20 Feet Assistive device: Rolling walker (2 wheels) Gait Pattern/deviations: Step-to pattern, Decreased stance time - left, Antalgic       General Gait Details: multi-modal cues for gait progression, sequence, safety; chair follow for safety; wt shift to LLE improved with distance. unsteady but no overt LOB; pt husband educated on use of gait belt   Stairs Stairs: Yes Stairs assistance: Min assist Stair Management: One rail Right, Step to pattern, With walker Number of Stairs:  (up 2 down 3) General stair comments: cues for seqeunce; reviewed technique with single handrail sideways and with rail/RW; husband present for session; educated on use of KI for stairs for added safety   Wheelchair Mobility     Tilt Bed    Modified Rankin (Stroke Patients Only)       Balance Overall balance assessment: Needs assistance, History of Falls Sitting-balance support: Feet supported, Bilateral upper extremity supported Sitting balance-Leahy Scale: Fair     Standing balance support: Reliant on assistive device for balance, During functional activity Standing balance-Leahy Scale: Poor                              Communication Communication Communication: No apparent difficulties  Cognition Arousal: Alert Behavior During Therapy: Anxious   PT - Cognitive impairments: Memory, History of cognitive impairments, Orientation, Attention, Sequencing, Problem solving, Safety/Judgement   Orientation impairments: Place, Time, Situation                   PT - Cognition Comments: decr STM; oriented to self and place Following commands: Impaired Following commands impaired: Only follows one  step commands consistently    Cueing Cueing Techniques: Verbal cues, Tactile cues, Gestural cues  Exercises Total Joint Exercises Ankle Circles/Pumps: Both, 5 reps, AAROM Quad Sets: AROM, Strengthening, Both, 5 reps Heel Slides:  AAROM, Left, 10 reps Straight Leg Raises: AAROM, Left, 5 reps    General Comments        Pertinent Vitals/Pain Pain Assessment Pain Assessment: Faces Faces Pain Scale: Hurts even more Pain Location: L knee Pain Descriptors / Indicators: Discomfort, Sore, Grimacing, Guarding, Crying Pain Intervention(s): Limited activity within patient's tolerance, Monitored during session, Premedicated before session, Repositioned    Home Living                          Prior Function            PT Goals (current goals can now be found in the care plan section) Acute Rehab PT Goals Patient Stated Goal: not fall and be able to get in and out of house PT Goal Formulation: With patient/family Time For Goal Achievement: 07/28/23 Potential to Achieve Goals: Good Progress towards PT goals: Progressing toward goals    Frequency    7X/week      PT Plan      Co-evaluation              AM-PAC PT "6 Clicks" Mobility   Outcome Measure  Help needed turning from your back to your side while in a flat bed without using bedrails?: A Little Help needed moving from lying on your back to sitting on the side of a flat bed without using bedrails?: A Little Help needed moving to and from a bed to a chair (including a wheelchair)?: A Little Help needed standing up from a chair using your arms (e.g., wheelchair or bedside chair)?: A Little Help needed to walk in hospital room?: A Little Help needed climbing 3-5 steps with a railing? : A Little 6 Click Score: 18    End of Session Equipment Utilized During Treatment: Gait belt Activity Tolerance: Patient tolerated treatment well Patient left: with call bell/phone within reach;with family/visitor present;in chair;with chair alarm set Nurse Communication: Mobility status PT Visit Diagnosis: Other abnormalities of gait and mobility (R26.89);History of falling (Z91.81)     Time: 9629-5284 PT Time Calculation (min) (ACUTE ONLY): 42  min  Charges:    $Gait Training: 23-37 mins $Therapeutic Exercise: 8-22 mins PT General Charges $$ ACUTE PT VISIT: 1 Visit                     Tyronn Golda, PT  Acute Rehab Dept [facility]) [phone]  07/10/2023    [facility] [place] 07/10/2023, 3:53 PM

## 2023-07-10 NOTE — Progress Notes (Signed)
   Subjective: 2 Days Post-Op Procedure(s) (LRB): ARTHROPLASTY, KNEE, TOTAL (Left) Patient reports pain as mild.   Patient seen in rounds by Dr. Lequita Halt. Patient is well, and has had no acute complaints or problems. Slower progression with therapy yesterday, but made improvements. Reports the knee is feeling better this morning, was sleeping well  Plan is to go Home after hospital stay.  Objective: Vital signs in last 24 hours: Temp:  [97.8 F (36.6 C)-100.2 F (37.9 C)] 100.2 F (37.9 C) (03/26 1610) Pulse Rate:  [79-96] 91 (03/26 0608) Resp:  [17-18] 17 (03/26 0608) BP: (120-160)/(44-60) 129/57 (03/26 0608) SpO2:  [93 %-99 %] 93 % (03/26 0608)  Intake/Output from previous day:  Intake/Output Summary (Last 24 hours) at 07/10/2023 0736 Last data filed at 07/10/2023 0600 Gross per 24 hour  Intake 1351.2 ml  Output --  Net 1351.2 ml    Intake/Output this shift: No intake/output data recorded.  Labs: Recent Labs    07/09/23 0338 07/10/23 0341  HGB 10.4* 10.3*   Recent Labs    07/09/23 0338 07/10/23 0341  WBC 10.6* 9.4  RBC 3.72* 3.63*  HCT 32.7* 32.8*  PLT 187 169   Recent Labs    07/09/23 0338  NA 135  K 4.7  CL 105  CO2 22  BUN 20  CREATININE 0.78  GLUCOSE 149*  CALCIUM 8.7*   No results for input(s): "LABPT", "INR" in the last 72 hours.  Exam: General - Patient is Alert and Oriented Extremity - Neurologically intact Neurovascular intact Sensation intact distally Dorsiflexion/Plantar flexion intact Dressing/Incision - clean, dry, no drainage Motor Function - intact, moving foot and toes well on exam.   Past Medical History:  Diagnosis Date   Arthritis    knees   Atrophic vaginitis    Cancer (HCC)    breast cancer - left   Diverticulosis    Endometriosis    Family history of breast cancer    Family history of colon cancer    Heart murmur    as a child only   History of radiation therapy 01/30/16- 03/02/16   Left Breast 50 Gy in 25  fractions.    Hyperlipidemia    Hypertension    Internal hemorrhoids    Osteopenia    Osteoporosis    Personal history of chemotherapy    Personal history of radiation therapy    Rheumatic fever    age 59   Thyroid disease    Hypothyroid   Ulcer    UTI (lower urinary tract infection)     Assessment/Plan: 2 Days Post-Op Procedure(s) (LRB): ARTHROPLASTY, KNEE, TOTAL (Left) Principal Problem:   OA (osteoarthritis) of knee Active Problems:   Osteoarthritis of left knee  Estimated body mass index is 33.32 kg/m as calculated from the following:   Height as of this encounter: 5' (1.524 m).   Weight as of this encounter: 77.4 kg. Up with therapy  DVT Prophylaxis - Xarelto Weight-bearing as tolerated  Discharge to home today with OPPT on Friday pending PT clearance Follow-up in our office in 2 weeks  The PDMP database was reviewed today prior to any opioid medications being prescribed to this patient.   Arther Abbott, PA-C Orthopedic Surgery [phone] 07/10/2023, 7:36 AM

## 2023-07-10 NOTE — Progress Notes (Signed)
 Physical Therapy Treatment Patient Details Name: [name] MRN: [id number] DOB: 25-Jun-1940 Today's Date: 07/10/2023   History of Present Illness 83 yo female s/p L TKA on 07/08/23. PMH: HTN, HLD, cognitive impairment, memory loss, breast CA    PT Comments  Pt progressing this session; continues to require encouragement and  safety cues throughout. Will see again this pm to review stairs in preparation for d/c home    If plan is discharge home, recommend the following: A little help with walking and/or transfers;A little help with bathing/dressing/bathroom;Assistance with cooking/housework;Help with stairs or ramp for entrance;Assist for transportation   Can travel by private vehicle        Equipment Recommendations  Rolling walker (2 wheels) (youth)    Recommendations for Other Services       Precautions / Restrictions Precautions Precautions: Fall;Knee Recall of Precautions/Restrictions: Impaired Precaution/Restrictions Comments: reviewed LE position, terminal knee extension with pt/husband; pt is unable to recall knee precautions Required Braces or Orthoses: Knee Immobilizer - Left Knee Immobilizer - Left: Discontinue once straight leg raise with < 10 degree lag Restrictions Weight Bearing Restrictions Per Provider Order: No LLE Weight Bearing Per Provider Order: Weight bearing as tolerated     Mobility  Bed Mobility               General bed mobility comments: in recliner    Transfers Overall transfer level: Needs assistance Equipment used: Rolling walker (2 wheels) Transfers: Sit to/from Stand, Bed to chair/wheelchair/BSC Sit to Stand: Min assist, Contact guard assist           General transfer comment: multi-modal  cues for hand placement and LLE position;    Ambulation/Gait Ambulation/Gait assistance: Min assist, +2 safety/equipment Gait Distance (Feet): 30 Feet Assistive device: Rolling walker (2 wheels) Gait Pattern/deviations: Step-to  pattern, Decreased stance time - left, Antalgic       General Gait Details: multi-modal cues for gait progression, sequence, safety; chair follow for safety; wt shift to LLE improved with distance   Stairs             Wheelchair Mobility     Tilt Bed    Modified Rankin (Stroke Patients Only)       Balance Overall balance assessment: Needs assistance, History of Falls Sitting-balance support: Feet supported, Bilateral upper extremity supported Sitting balance-Leahy Scale: Fair     Standing balance support: Reliant on assistive device for balance, During functional activity Standing balance-Leahy Scale: Poor                              Communication Communication Communication: No apparent difficulties  Cognition Arousal: Alert Behavior During Therapy: Anxious   PT - Cognitive impairments: Memory, History of cognitive impairments, Orientation, Attention, Sequencing, Problem solving, Safety/Judgement   Orientation impairments: Place, Time, Situation                   PT - Cognition Comments: decr STM; oriented to self and place Following commands: Impaired Following commands impaired: Only follows one step commands consistently    Cueing Cueing Techniques: Verbal cues, Tactile cues, Gestural cues  Exercises Total Joint Exercises Ankle Circles/Pumps: AROM, Both, 5 reps    General Comments        Pertinent Vitals/Pain Pain Assessment Pain Assessment: Faces Faces Pain Scale: Hurts whole lot Pain Location: L knee Pain Descriptors / Indicators: Discomfort, Sore, Grimacing, Guarding, Crying Pain Intervention(s): Limited activity within patient's tolerance, Monitored during  session, Premedicated before session, Repositioned, Ice applied    Home Living                          Prior Function            PT Goals (current goals can now be found in the care plan section) Acute Rehab PT Goals Patient Stated Goal: not fall and  be able to get in and out of house PT Goal Formulation: With patient/family Time For Goal Achievement: 07/28/23 Potential to Achieve Goals: Good Progress towards PT goals: Progressing toward goals    Frequency    7X/week      PT Plan      Co-evaluation              AM-PAC PT "6 Clicks" Mobility   Outcome Measure  Help needed turning from your back to your side while in a flat bed without using bedrails?: A Little Help needed moving from lying on your back to sitting on the side of a flat bed without using bedrails?: A Little Help needed moving to and from a bed to a chair (including a wheelchair)?: A Little Help needed standing up from a chair using your arms (e.g., wheelchair or bedside chair)?: A Little Help needed to walk in hospital room?: A Little Help needed climbing 3-5 steps with a railing? : A Little 6 Click Score: 18    End of Session Equipment Utilized During Treatment: Gait belt Activity Tolerance: Patient limited by pain;Patient tolerated treatment well;Patient limited by fatigue Patient left: with call bell/phone within reach;with family/visitor present;in chair;with chair alarm set Nurse Communication: Mobility status PT Visit Diagnosis: Other abnormalities of gait and mobility (R26.89);History of falling (Z91.81)     Time: 7829-5621 PT Time Calculation (min) (ACUTE ONLY): 16 min  Charges:    $Gait Training: 8-22 mins PT General Charges $$ ACUTE PT VISIT: 1 Visit                     Parul Porcelli, PT  Acute Rehab Dept ([facility]) [phone]  07/10/2023    [facility] 07/10/2023, 12:02 PM

## 2023-07-11 NOTE — Discharge Summary (Signed)
 Patient ID: [name] MRN: [id number] DOB/AGE: 01/11/1941 83 y.o.  Admit date: 07/08/2023 Discharge date: 07/10/2023  Admission Diagnoses:  Principal Problem:   OA (osteoarthritis) of knee Active Problems:   Osteoarthritis of left knee   Discharge Diagnoses:  Same  Past Medical History:  Diagnosis Date   Arthritis    knees   Atrophic vaginitis    Cancer (HCC)    breast cancer - left   Diverticulosis    Endometriosis    Family history of breast cancer    Family history of colon cancer    Heart murmur    as a child only   History of radiation therapy 01/30/16- 03/02/16   Left Breast 50 Gy in 25 fractions.    Hyperlipidemia    Hypertension    Internal hemorrhoids    Osteopenia    Osteoporosis    Personal history of chemotherapy    Personal history of radiation therapy    Rheumatic fever    age 83   Thyroid disease    Hypothyroid   Ulcer    UTI (lower urinary tract infection)     Surgeries: Procedure(s): ARTHROPLASTY, KNEE, TOTAL on 07/08/2023   Consultants:   Discharged Condition: Improved  Hospital Course: Alexza Norbeck Zellner is an 83 y.o. female who was admitted 07/08/2023 for operative treatment ofOA (osteoarthritis) of knee. Patient has severe unremitting pain that affects sleep, daily activities, and work/hobbies. After pre-op clearance the patient was taken to the operating room on 07/08/2023 and underwent  Procedure(s): ARTHROPLASTY, KNEE, TOTAL.    Patient was given perioperative antibiotics:  Anti-infectives (From admission, onward)    Start     Dose/Rate Route Frequency Ordered Stop   07/08/23 1630  ceFAZolin (ANCEF) IVPB 2g/100 mL premix        2 g 200 mL/hr over 30 Minutes Intravenous Every 6 hours 07/08/23 1533 07/09/23 0846   07/08/23 0815  ceFAZolin (ANCEF) IVPB 2g/100 mL premix        2 g 200 mL/hr over 30 Minutes Intravenous On call to O.R. 07/08/23 7829 07/08/23 1028        Patient was given sequential compression devices,  early ambulation, and chemoprophylaxis to prevent DVT.  Patient benefited maximally from hospital stay and there were no complications.    Recent vital signs: No data found.   Recent laboratory studies:  Recent Labs    07/09/23 0338 07/10/23 0341  WBC 10.6* 9.4  HGB 10.4* 10.3*  HCT 32.7* 32.8*  PLT 187 169  NA 135  --   K 4.7  --   CL 105  --   CO2 22  --   BUN 20  --   CREATININE 0.78  --   GLUCOSE 149*  --   CALCIUM 8.7*  --      Discharge Medications:   Allergies as of 07/10/2023       Reactions   Ferrlecit [na Ferric Gluc Cplx In Sucrose] Shortness Of Breath, Other (See Comments)   Made the hands and feet feel odd, also   Nitrofurantoin Nausea And Vomiting, Other (See Comments)   GI upset   Sulfamethoxazole-trimethoprim Nausea And Vomiting, Other (See Comments)   GI upset        Medication List     TAKE these medications    amLODipine 5 MG tablet Commonly known as: NORVASC Take 1 tablet (5 mg total) by mouth every morning.   atorvastatin 20 MG tablet Commonly known as: LIPITOR Take 1 tablet (20 mg  total) by mouth daily.   donepezil 5 MG tablet Commonly known as: ARICEPT TAKE 1 TABLET BY MOUTH ONCE DAILY AT BEDTIME   esomeprazole 40 MG capsule Commonly known as: NEXIUM Take 1 capsule (40 mg total) by mouth daily.   ferrous sulfate 324 MG Tbec Take 324 mg by mouth daily with breakfast.   furosemide 20 MG tablet Commonly known as: LASIX Take 1 tablet (20 mg total) by mouth every morning.   levothyroxine 50 MCG tablet Commonly known as: SYNTHROID Take 1 tablet (50 mcg total) by mouth daily before breakfast.   losartan-hydrochlorothiazide 100-12.5 MG tablet Commonly known as: HYZAAR Take 1 tablet by mouth every morning.   methocarbamol 500 MG tablet Commonly known as: ROBAXIN Take 1 tablet (500 mg total) by mouth every 6 (six) hours as needed for muscle spasms.   metoprolol tartrate 25 MG tablet Commonly known as: LOPRESSOR Take 1  tablet (25 mg total) by mouth in the morning and at bedtime.   ondansetron 4 MG tablet Commonly known as: ZOFRAN Take 1 tablet (4 mg total) by mouth every 6 (six) hours as needed for nausea.   oxyCODONE 5 MG immediate release tablet Commonly known as: Oxy IR/ROXICODONE Take 1-2 tablets (5-10 mg total) by mouth every 8 (eight) hours as needed for severe pain (pain score 7-10).   potassium chloride 10 MEQ tablet Commonly known as: Klor-Con 10 Take 1 tablet (10 mEq total) by mouth 2 (two) times daily.   rivaroxaban 10 MG Tabs tablet Commonly known as: XARELTO Take 1 tablet (10 mg total) by mouth daily with breakfast for 20 days. Then take one 81 mg aspirin once a day for three weeks. Then discontinue aspirin.   tamoxifen 20 MG tablet Commonly known as: NOLVADEX TAKE 1 TABLET BY MOUTH ONCE DAILY IN THE MORNING   traMADol 50 MG tablet Commonly known as: ULTRAM Take 1-2 tablets (50-100 mg total) by mouth every 6 (six) hours as needed for moderate pain (pain score 4-6).   Tylenol 325 MG tablet Generic drug: acetaminophen Take 325-650 mg by mouth every 6 (six) hours as needed for mild pain or headache.               Discharge Care Instructions  (From admission, onward)           Start     Ordered   07/10/23 0000  Weight bearing as tolerated        07/10/23 0750   07/10/23 0000  Change dressing       Comments: You may remove the bulky bandage (ACE wrap and gauze) two days after surgery. You will have an adhesive waterproof bandage underneath. Leave this in place until your first follow-up appointment.   07/10/23 0750            Diagnostic Studies: No results found.  Disposition: Discharge disposition: 01-Home or Self Care       Discharge Instructions     Call MD / Call 911   Complete by: As directed    If you experience chest pain or shortness of breath, CALL 911 and be transported to the hospital emergency room.  If you develope a fever above 101 F, pus  (white drainage) or increased drainage or redness at the wound, or calf pain, call your surgeon's office.   Change dressing   Complete by: As directed    You may remove the bulky bandage (ACE wrap and gauze) two days after surgery. You will have an adhesive waterproof  bandage underneath. Leave this in place until your first follow-up appointment.   Constipation Prevention   Complete by: As directed    Drink plenty of fluids.  Prune juice may be helpful.  You may use a stool softener, such as Colace (over the counter) 100 mg twice a day.  Use MiraLax (over the counter) for constipation as needed.   Diet - low sodium heart healthy   Complete by: As directed    Do not put a pillow under the knee. Place it under the heel.   Complete by: As directed    Driving restrictions   Complete by: As directed    No driving for two weeks   Post-operative opioid taper instructions:   Complete by: As directed    POST-OPERATIVE OPIOID TAPER INSTRUCTIONS: It is important to wean off of your opioid medication as soon as possible. If you do not need pain medication after your surgery it is ok to stop day one. Opioids include: Codeine, Hydrocodone(Norco, Vicodin), Oxycodone(Percocet, oxycontin) and hydromorphone amongst others.  Long term and even short term use of opiods can cause: Increased pain response Dependence Constipation Depression Respiratory depression And more.  Withdrawal symptoms can include Flu like symptoms Nausea, vomiting And more Techniques to manage these symptoms Hydrate well Eat regular healthy meals Stay active Use relaxation techniques(deep breathing, meditating, yoga) Do Not substitute Alcohol to help with tapering If you have been on opioids for less than two weeks and do not have pain than it is ok to stop all together.  Plan to wean off of opioids This plan should start within one week post op of your joint replacement. Maintain the same interval or time between taking  each dose and first decrease the dose.  Cut the total daily intake of opioids by one tablet each day Next start to increase the time between doses. The last dose that should be eliminated is the evening dose.      TED hose   Complete by: As directed    Use stockings (TED hose) for three weeks on both leg(s).  You may remove them at night for sleeping.   Weight bearing as tolerated   Complete by: As directed         Follow-up Information     Ollen Gross, MD. Go on 07/24/2023.   Specialty: Orthopedic Surgery Why: You have a post op appointment scheduled for Wednesday 07/24/23 at 2:45pm Contact information: [address] [place] [address] [phone]         Zorita Pang.. Go on 07/12/2023.   Why: You have a Physical Therapy appointment scheduled for Friday 07/12/23 at 11:00am. [address] Contact information: [address] [place] [address] [phone]                  Signed: Arther Abbott 07/11/2023, 2:44 PM

## 2023-07-15 DIAGNOSIS — M25562 Pain in left knee: Secondary | ICD-10-CM | POA: Diagnosis not present

## 2023-07-17 DIAGNOSIS — M25562 Pain in left knee: Secondary | ICD-10-CM | POA: Diagnosis not present

## 2023-07-19 DIAGNOSIS — M25562 Pain in left knee: Secondary | ICD-10-CM | POA: Diagnosis not present

## 2023-07-22 DIAGNOSIS — M25562 Pain in left knee: Secondary | ICD-10-CM | POA: Diagnosis not present

## 2023-07-24 DIAGNOSIS — M25562 Pain in left knee: Secondary | ICD-10-CM | POA: Diagnosis not present

## 2023-07-26 ENCOUNTER — Other Ambulatory Visit: Payer: Medicare Other

## 2023-07-26 DIAGNOSIS — M25562 Pain in left knee: Secondary | ICD-10-CM | POA: Diagnosis not present

## 2023-07-26 NOTE — Patient Outreach (Signed)
    07/26/2023 Name: [name] MRN: [id number] DOB: 1941-01-11  An unsuccessful outreach attempt was made today for a scheduled Care Management visit.   Follow Up Plan:  A HIPAA compliant phone message was left for the patient providing contact information and requesting a return call.  A member of the care team will make additional outreach attempts.     Juanell Fairly [facility] Population Health RN Care Manager Direct Dial: [phone]  Fax: [phone] Website: [email or web address]

## 2023-07-29 NOTE — Patient Outreach (Signed)
   07/29/2023 Name: [name] MRN: [id number] DOB: [birth year]  Second unsuccessful telephone outreach was attempted today to contact the patient about Care Management needs.    Follow Up Plan:  A HIPAA compliant phone message was left for the patient providing contact information and requesting a return call.  A member of the care management team will make additional outreach attempts   Roxie Cord [facility] RN Care Manager Direct Dial: [phone]  Fax: [phone] Website: [email or web address]

## 2023-08-15 DIAGNOSIS — Z4789 Encounter for other orthopedic aftercare: Secondary | ICD-10-CM | POA: Diagnosis not present

## 2023-08-19 ENCOUNTER — Telehealth: Payer: Self-pay

## 2023-08-19 NOTE — Telephone Encounter (Signed)
 Left message on voicemail about appointment on 08/20/23

## 2023-08-20 ENCOUNTER — Inpatient Hospital Stay: Payer: Medicare Other | Admitting: Hematology and Oncology

## 2023-08-20 ENCOUNTER — Inpatient Hospital Stay: Payer: Medicare Other | Attending: Adult Health

## 2023-08-20 VITALS — BP 110/47 | HR 96 | Temp 97.5°F | Resp 17 | Wt 161.4 lb

## 2023-08-20 DIAGNOSIS — Z1732 Human epidermal growth factor receptor 2 negative status: Secondary | ICD-10-CM | POA: Insufficient documentation

## 2023-08-20 DIAGNOSIS — Z9221 Personal history of antineoplastic chemotherapy: Secondary | ICD-10-CM | POA: Insufficient documentation

## 2023-08-20 DIAGNOSIS — Z1721 Progesterone receptor positive status: Secondary | ICD-10-CM | POA: Insufficient documentation

## 2023-08-20 DIAGNOSIS — Z923 Personal history of irradiation: Secondary | ICD-10-CM | POA: Diagnosis not present

## 2023-08-20 DIAGNOSIS — D509 Iron deficiency anemia, unspecified: Secondary | ICD-10-CM

## 2023-08-20 DIAGNOSIS — Z170 Estrogen receptor positive status [ER+]: Secondary | ICD-10-CM | POA: Insufficient documentation

## 2023-08-20 DIAGNOSIS — C50412 Malignant neoplasm of upper-outer quadrant of left female breast: Secondary | ICD-10-CM | POA: Diagnosis not present

## 2023-08-20 DIAGNOSIS — Z79810 Long term (current) use of selective estrogen receptor modulators (SERMs): Secondary | ICD-10-CM | POA: Diagnosis not present

## 2023-08-20 LAB — CBC WITH DIFFERENTIAL/PLATELET
Abs Immature Granulocytes: 0.01 10*3/uL (ref 0.00–0.07)
Basophils Absolute: 0.1 10*3/uL (ref 0.0–0.1)
Basophils Relative: 1 %
Eosinophils Absolute: 0.5 10*3/uL (ref 0.0–0.5)
Eosinophils Relative: 6 %
HCT: 39.8 % (ref 36.0–46.0)
Hemoglobin: 12.9 g/dL (ref 12.0–15.0)
Immature Granulocytes: 0 %
Lymphocytes Relative: 23 %
Lymphs Abs: 1.9 10*3/uL (ref 0.7–4.0)
MCH: 27.8 pg (ref 26.0–34.0)
MCHC: 32.4 g/dL (ref 30.0–36.0)
MCV: 85.8 fL (ref 80.0–100.0)
Monocytes Absolute: 0.9 10*3/uL (ref 0.1–1.0)
Monocytes Relative: 11 %
Neutro Abs: 4.8 10*3/uL (ref 1.7–7.7)
Neutrophils Relative %: 59 %
Platelets: 257 10*3/uL (ref 150–400)
RBC: 4.64 MIL/uL (ref 3.87–5.11)
RDW: 15.3 % (ref 11.5–15.5)
WBC: 8.2 10*3/uL (ref 4.0–10.5)
nRBC: 0 % (ref 0.0–0.2)

## 2023-08-20 NOTE — Progress Notes (Signed)
  Cancer Center Cancer Follow up:    Stephanie Sit, MD [address] [place] [address]   DIAGNOSIS:  Cancer Staging  Malignant neoplasm of upper-outer quadrant of left breast in female, estrogen receptor positive (HCC) Staging form: Breast, AJCC 7th Edition - Clinical: Stage IA (T1b, N0, M0) - Signed by Colie Dawes, MD on 09/07/2015 Laterality: Left - Pathologic: Stage IA (T1b, N0, cM0) - Unsigned   SUMMARY OF ONCOLOGIC HISTORY: Oncology History  Malignant neoplasm of upper-outer quadrant of left breast in female, estrogen receptor positive (HCC)  08/19/2015 Initial Biopsy   Left core biopsy 2:30 o'clock: IDC, ER+(100%), PR+(100%), Ki-67 10%, HER-2 negative (ratio 1.23).    09/07/2015 Initial Diagnosis   Malignant neoplasm of upper-outer quadrant of left breast in female, estrogen receptor positive (HCC)   09/09/2015 Surgery   Left breast lumpectomy ([facility]): IDC, DCIS, grade 1, 0.9 cm, margins negative, 2 SLN negative, T1b, N0   09/09/2015 Oncotype testing   29/19% intermediate risk   09/19/2015 Genetic Testing   genetics testing through the Breast/Ovarian gene panel offered by [facility] found no deleterious mutations in ATM, BARD1, BRCA1, BRCA2, BRIP1, CDH1, CHEK2, EPCAM, FANCC, MLH1, MSH2, MSH6, NBN, PALB2, PMS2, PTEN, RAD51C, RAD51D, TP53, and XRCC2.   10/24/2015 - 12/26/2015 Adjuvant Chemotherapy   Docetaxel  and Cyclophosphamide  x 4 cycles   01/30/2016 - 03/02/2016 Radiation Therapy   Adjuvant radiation Lurena Sally): Left breast: 50 Gy in 25 fractions   04/2016 -  Anti-estrogen oral therapy   Tamoxifen  20mg  daily     CURRENT THERAPY: Tamoxifen   INTERVAL HISTORY:  Discussed the use of AI scribe software for clinical note transcription with the patient, who gave verbal consent to proceed.  [name] 83 y.o. female with a history of breast cancer and iron deficiency anemia, presents for a follow-up visit. She is currently on tamoxifen  for  breast cancer and iron supplementation for anemia. The patient underwent an upper endoscopy in May 2024, which revealed an ulcer. She received two doses of IV iron in late May 2024. A repeat endoscopy in September 2024 showed a healed ulcer, and her iron levels have since improved.  She continues on Nexium  daily with good tolerance.  The patient recently had a screening mammogram that showed asymmetry in the left breast, necessitating a diagnostic ultrasound scheduled for November 14th. She expresses frustration with the delay in scheduling the diagnostic procedure.  The patient's medication regimen includes amlodipine , donezepil (Aricept ), and esomeprazole  (Nexium ). She also takes an iron tablet but does not take a multivitamin. The patient reports no adverse effects from the iron tablet.  The patient's partner assists with medication administration and reports doing so diligently.   Patient Active Problem List   Diagnosis Date Noted   OA (osteoarthritis) of knee 07/08/2023   Osteoarthritis of left knee 07/08/2023   Ankle edema, bilateral 10/09/2022   Acute gastric ulcer without hemorrhage or perforation 08/23/2022   GIB (gastrointestinal bleeding) 08/22/2022   Iron deficiency anemia due to chronic blood loss 08/22/2022   Heme positive stool 08/22/2022   GI bleed 08/21/2022   Acute blood loss anemia 08/21/2022   RUQ pain 08/21/2022   Hypokalemia 08/21/2022   Chronic kidney disease, stage III (moderate) (HCC) 08/21/2022   History of breast cancer 08/21/2022   Short-term memory loss 08/21/2022   Cognitive impairment 10/04/2021   (HFpEF) heart failure with preserved ejection fraction (HCC) 10/25/2020   Fusion of spine of thoracolumbar region 02/21/2018   Chronic midline low back pain  with bilateral sciatica 11/06/2016   Genetic testing 09/23/2015   Malignant neoplasm of upper-outer quadrant of left breast in female, estrogen receptor positive (HCC) 09/07/2015   Family history of breast  cancer    Family history of colon cancer    Prediabetes 08/30/2014   Endometriosis    Osteopenia    Hypertension    Lower urinary tract infectious disease 03/05/2011   Dysuria 03/05/2011   Hypothyroidism 06/06/2009   FATIGUE 06/06/2009   ELEVATED BLOOD PRESSURE 06/06/2009   Disorder of bone and cartilage 09/09/2008   SCOLIOSIS 09/09/2008   Hyperlipidemia 07/30/2008   Migraine headache 07/30/2008   GERD 07/30/2008   ENTHESOPATHY OF HIP REGION 07/30/2008   HEADACHE 07/30/2008    is allergic to ferrlecit [na ferric gluc cplx in sucrose], nitrofurantoin, and sulfamethoxazole-trimethoprim.  MEDICAL HISTORY: Past Medical History:  Diagnosis Date   Arthritis    knees   Atrophic vaginitis    Cancer (HCC)    breast cancer - left   Diverticulosis    Endometriosis    Family history of breast cancer    Family history of colon cancer    Heart murmur    as a child only   History of radiation therapy 01/30/16- 03/02/16   Left Breast 50 Gy in 25 fractions.    Hyperlipidemia    Hypertension    Internal hemorrhoids    Osteopenia    Osteoporosis    Personal history of chemotherapy    Personal history of radiation therapy    Rheumatic fever    age 47   Thyroid  disease    Hypothyroid   Ulcer    UTI (lower urinary tract infection)     SURGICAL HISTORY: Past Surgical History:  Procedure Laterality Date   APPENDECTOMY     BIOPSY  08/23/2022   Procedure: BIOPSY;  Surgeon: Nannette Babe, MD;  Location[facility] ENDOSCOPY;  Service: Gastroenterology;;   BREAST BIOPSY Left 08/19/2015   BREAST LUMPECTOMY Left 09/08/2015   BREAST LUMPECTOMY WITH RADIOACTIVE SEED AND SENTINEL LYMPH NODE BIOPSY Left 09/09/2015   Procedure: LEFT BREAST LUMPECTOMY WITH RADIOACTIVE SEED AND SENTINEL LYMPH NODE BIOPSY;  Surgeon: Adalberto Hollow, MD;  Location: [facility] OR;  Service: General;  Laterality: Left;   COLONOSCOPY     COLONOSCOPY N/A 08/23/2022   Procedure: COLONOSCOPY;  Surgeon: Nannette Babe, MD;  Location:  [facility] ENDOSCOPY;  Service: Gastroenterology;  Laterality: N/A;   DILATATION & CURETTAGE/HYSTEROSCOPY WITH MYOSURE N/A 10/09/2016   Procedure: DILATATION & CURETTAGE/HYSTEROSCOPY WITH MYOSURE;  Surgeon: Lavoie, Marie-Lyne, MD;  Location: [facility] ORS;  Service: Gynecology;  Laterality: N/A;  requesting 7:30am in our block  requests one hour   DILATATION & CURETTAGE/HYSTEROSCOPY WITH MYOSURE N/A 03/19/2022   Procedure: DILATATION & CURETTAGE/HYSTEROSCOPY;  Surgeon: Wanita Gutta, MD;  Location[facility];  Service: Gynecology;  Laterality: N/A;   ESOPHAGOGASTRODUODENOSCOPY (EGD) WITH PROPOFOL  N/A 08/23/2022   Procedure: ESOPHAGOGASTRODUODENOSCOPY (EGD) WITH PROPOFOL ;  Surgeon: Nannette Babe, MD;  Location: [facility] ENDOSCOPY;  Service: Gastroenterology;  Laterality: N/A;   ESOPHAGOGASTRODUODENOSCOPY ENDOSCOPY     IR GENERIC HISTORICAL  12/05/2015   IR CV LINE INJECTION 12/05/2015 Artice Last, MD [facility]-INTERV RAD   LAPAROSCOPIC ENDOMETRIOSIS FULGURATION  1978   PELVIC LAPAROSCOPY     PORTACATH PLACEMENT N/A 10/04/2015   Procedure: INSERTION PORT-A-CATH WITH ULTRASOUND;  Surgeon: Adalberto Hollow, MD;  Location: [facility] OR;  Service: General;  Laterality: N/A;   portacath removal     TONSILLECTOMY     TOTAL KNEE ARTHROPLASTY  Left 07/08/2023   Procedure: ARTHROPLASTY, KNEE, TOTAL;  Surgeon: Liliane Rei, MD;  Location: [facility] ORS;  Service: Orthopedics;  Laterality: Left;   ULNAR NERVE REPAIR  2010    SOCIAL HISTORY: Social History   Socioeconomic History   Marital status: Married    Spouse name: Not on file   Number of children: 0   Years of education: Not on file   Highest education level: Not on file  Occupational History   Occupation: retired  Tobacco Use   Smoking status: Never   Smokeless tobacco: Never  Vaping Use   Vaping status: Never Used  Substance and Sexual Activity   Alcohol use: No   Drug use: No   Sexual activity: Not Currently    Birth control/protection: Post-menopausal   Other Topics Concern   Not on file  Social History Narrative   Retired Midwife.    Social Drivers of Corporate investment banker Strain: Low Risk  (05/22/2022)   Overall Financial Resource Strain (CARDIA)    Difficulty of Paying Living Expenses: Not very hard  Food Insecurity: No Food Insecurity (07/08/2023)   Hunger Vital Sign    Worried About Running Out of Food in the Last Year: Never true    Ran Out of Food in the Last Year: Never true  Transportation Needs: No Transportation Needs (07/08/2023)   PRAPARE - Administrator, Civil Service (Medical): No    Lack of Transportation (Non-Medical): No  Physical Activity: Insufficiently Active (03/17/2021)   Exercise Vital Sign    Days of Exercise per Week: 3 days    Minutes of Exercise per Session: 10 min  Stress: No Stress Concern Present (05/22/2022)   [employer] of Occupational Health - Occupational Stress Questionnaire    Feeling of Stress : Not at all  Social Connections: Socially Integrated (07/08/2023)   Social Connection and Isolation Panel [NHANES]    Frequency of Communication with Friends and Family: More than three times a week    Frequency of Social Gatherings with Friends and Family: More than three times a week    Attends Religious Services: More than 4 times per year    Active Member of [employer] or Organizations: Yes    Attends Engineer, structural: More than 4 times per year    Marital Status: Married  Catering manager Violence: Not At Risk (07/08/2023)   Humiliation, Afraid, Rape, and Kick questionnaire    Fear of Current or Ex-Partner: No    Emotionally Abused: No    Physically Abused: No    Sexually Abused: No    FAMILY HISTORY: Family History  Problem Relation Age of Onset   Breast cancer Mother        Age 3   Hypertension Mother    Heart disease Father    Stroke Father    Colon cancer Father        dx 35s   Breast cancer Paternal Grandmother        Age 75   Diabetes  Paternal Grandmother    Heart disease Maternal Grandmother     Review of Systems  Constitutional:  Negative for appetite change, chills, fatigue, fever and unexpected weight change.  HENT:   Negative for hearing loss, lump/mass and trouble swallowing.   Eyes:  Negative for eye problems and icterus.  Respiratory:  Negative for chest tightness, cough and shortness of breath.   Cardiovascular:  Negative for chest pain, leg swelling and palpitations.  Gastrointestinal:  Negative  for abdominal distention, abdominal pain, constipation, diarrhea, nausea and vomiting.  Endocrine: Negative for hot flashes.  Genitourinary:  Negative for difficulty urinating.   Musculoskeletal:  Negative for arthralgias.  Skin:  Negative for itching and rash.  Neurological:  Negative for dizziness, extremity weakness, headaches and numbness.  Hematological:  Negative for adenopathy. Does not bruise/bleed easily.  Psychiatric/Behavioral:  Negative for depression. The patient is not nervous/anxious.       PHYSICAL EXAMINATION    Vitals:   08/20/23 1216  BP: (!) 110/47  Pulse: 96  Resp: 17  Temp: (!) 97.5 F (36.4 C)  SpO2: 97%    Physical Exam Constitutional:      General: She is not in acute distress.    Appearance: Normal appearance. She is not toxic-appearing.  HENT:     Head: Normocephalic and atraumatic.     Mouth/Throat:     Mouth: Mucous membranes are moist.     Pharynx: Oropharynx is clear. No oropharyngeal exudate or posterior oropharyngeal erythema.  Eyes:     General: No scleral icterus. Cardiovascular:     Rate and Rhythm: Normal rate and regular rhythm.     Pulses: Normal pulses.     Heart sounds: Normal heart sounds.  Pulmonary:     Effort: Pulmonary effort is normal.     Breath sounds: Normal breath sounds.  Chest:     Comments: Right breast benign, left breast s/p lumepctomy and radiation, no sign of local recurrence.  Abdominal:     General: Abdomen is flat. Bowel sounds  are normal. There is no distension.     Palpations: Abdomen is soft.     Tenderness: There is no abdominal tenderness.  Musculoskeletal:        General: No swelling.     Cervical back: Neck supple.  Lymphadenopathy:     Cervical: No cervical adenopathy.     Upper Body:     Right upper body: No axillary adenopathy.     Left upper body: No axillary adenopathy.  Skin:    General: Skin is warm and dry.     Findings: No rash.  Neurological:     General: No focal deficit present.     Mental Status: She is alert.  Psychiatric:        Mood and Affect: Mood normal.        Behavior: Behavior normal.     LABORATORY DATA:  CBC    Component Value Date/Time   WBC 8.2 08/20/2023 1147   RBC 4.64 08/20/2023 1147   HGB 12.9 08/20/2023 1147   HGB 11.6 (L) 03/26/2018 1442   HGB 15.0 01/03/2017 1052   HCT 39.8 08/20/2023 1147   HCT 45.0 01/03/2017 1052   PLT 257 08/20/2023 1147   PLT 214 03/26/2018 1442   PLT 246 01/03/2017 1052   MCV 85.8 08/20/2023 1147   MCV 87.2 01/03/2017 1052   MCH 27.8 08/20/2023 1147   MCHC 32.4 08/20/2023 1147   RDW 15.3 08/20/2023 1147   RDW 13.8 01/03/2017 1052   LYMPHSABS 1.9 08/20/2023 1147   LYMPHSABS 1.3 01/03/2017 1052   MONOABS 0.9 08/20/2023 1147   MONOABS 0.7 01/03/2017 1052   EOSABS 0.5 08/20/2023 1147   EOSABS 0.3 01/03/2017 1052   BASOSABS 0.1 08/20/2023 1147   BASOSABS 0.1 01/03/2017 1052      ASSESSMENT and THERAPY PLAN:   No problem-specific Assessment & Plan notes found for this encounter.   All questions were answered. The patient knows to call the  clinic with any problems, questions or concerns. We can certainly see the patient much sooner if necessary.  Total encounter time:30 minutes*in face-to-face visit time, chart review, lab review, care coordination, order entry, and documentation of the encounter time.   Alwin Baars, NP 08/20/23 12:26 PM Medical Oncology and Hematology [facility] [address]  [place], [place] [address] Tel. [phone]    Fax. [phone]  *Total Encounter Time as defined by the Centers for Medicare and Medicaid Services includes, in addition to the face-to-face time of a patient visit (documented in the note above) non-face-to-face time: obtaining and reviewing outside history, ordering and reviewing medications, tests or procedures, care coordination (communications with other health care professionals or caregivers) and documentation in the medical record.

## 2023-08-21 DIAGNOSIS — M25562 Pain in left knee: Secondary | ICD-10-CM | POA: Diagnosis not present

## 2023-08-21 NOTE — Assessment & Plan Note (Signed)
 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  She is status post left breast lumpectomy, adjuvant chemotherapy, adjuvant radiation therapy, and antiestrogen therapy with tamoxifen  that began in January 2018.  Assessment and Plan Assessment & Plan Breast cancer Early-stage breast cancer treated with chemotherapy, radiation, and tamoxifen . Completed seven years of tamoxifen  therapy. No benefit in extending therapy beyond seven years. Recent mammogram normal. - Discontinue tamoxifen  after current supply is finished. - Order mammogram for October. -Previous anemia has now resolved -No concerns on exam today -RTC for follow up in 1 yr.

## 2023-08-23 DIAGNOSIS — M25562 Pain in left knee: Secondary | ICD-10-CM | POA: Diagnosis not present

## 2023-08-27 DIAGNOSIS — M25562 Pain in left knee: Secondary | ICD-10-CM | POA: Diagnosis not present

## 2023-08-29 DIAGNOSIS — M25562 Pain in left knee: Secondary | ICD-10-CM | POA: Diagnosis not present

## 2023-08-31 ENCOUNTER — Other Ambulatory Visit: Payer: Self-pay | Admitting: Neurology

## 2023-09-03 DIAGNOSIS — M25562 Pain in left knee: Secondary | ICD-10-CM | POA: Diagnosis not present

## 2023-09-05 ENCOUNTER — Other Ambulatory Visit: Payer: Self-pay | Admitting: *Deleted

## 2023-09-05 DIAGNOSIS — M25562 Pain in left knee: Secondary | ICD-10-CM | POA: Diagnosis not present

## 2023-09-10 ENCOUNTER — Other Ambulatory Visit: Payer: Self-pay | Admitting: Family Medicine

## 2023-09-10 DIAGNOSIS — M25562 Pain in left knee: Secondary | ICD-10-CM | POA: Diagnosis not present

## 2023-09-12 DIAGNOSIS — M25562 Pain in left knee: Secondary | ICD-10-CM | POA: Diagnosis not present

## 2023-09-17 DIAGNOSIS — M25562 Pain in left knee: Secondary | ICD-10-CM | POA: Diagnosis not present

## 2023-10-06 ENCOUNTER — Other Ambulatory Visit: Payer: Self-pay | Admitting: Neurology

## 2023-10-18 ENCOUNTER — Other Ambulatory Visit: Payer: Self-pay | Admitting: Family Medicine

## 2023-11-18 ENCOUNTER — Other Ambulatory Visit: Payer: Self-pay | Admitting: Hematology and Oncology

## 2023-11-18 ENCOUNTER — Other Ambulatory Visit: Payer: Self-pay | Admitting: Family Medicine

## 2023-11-18 DIAGNOSIS — I5033 Acute on chronic diastolic (congestive) heart failure: Secondary | ICD-10-CM

## 2024-01-22 DIAGNOSIS — L814 Other melanin hyperpigmentation: Secondary | ICD-10-CM | POA: Diagnosis not present

## 2024-01-22 DIAGNOSIS — D225 Melanocytic nevi of trunk: Secondary | ICD-10-CM | POA: Diagnosis not present

## 2024-01-22 DIAGNOSIS — L821 Other seborrheic keratosis: Secondary | ICD-10-CM | POA: Diagnosis not present

## 2024-01-22 DIAGNOSIS — Z08 Encounter for follow-up examination after completed treatment for malignant neoplasm: Secondary | ICD-10-CM | POA: Diagnosis not present

## 2024-01-22 DIAGNOSIS — D492 Neoplasm of unspecified behavior of bone, soft tissue, and skin: Secondary | ICD-10-CM | POA: Diagnosis not present

## 2024-01-22 DIAGNOSIS — Z85828 Personal history of other malignant neoplasm of skin: Secondary | ICD-10-CM | POA: Diagnosis not present

## 2024-01-28 ENCOUNTER — Other Ambulatory Visit: Payer: Self-pay | Admitting: Family Medicine

## 2024-01-29 ENCOUNTER — Encounter: Payer: Self-pay | Admitting: Family Medicine

## 2024-01-29 ENCOUNTER — Ambulatory Visit: Admitting: Family Medicine

## 2024-01-29 VITALS — BP 134/60 | HR 104 | Temp 97.6°F | Wt 159.6 lb

## 2024-01-29 DIAGNOSIS — R634 Abnormal weight loss: Secondary | ICD-10-CM

## 2024-01-29 DIAGNOSIS — R5383 Other fatigue: Secondary | ICD-10-CM

## 2024-01-29 DIAGNOSIS — E038 Other specified hypothyroidism: Secondary | ICD-10-CM

## 2024-01-29 LAB — CBC WITH DIFFERENTIAL/PLATELET
Basophils Absolute: 0.1 K/uL (ref 0.0–0.1)
Basophils Relative: 0.9 % (ref 0.0–3.0)
Eosinophils Absolute: 0.5 K/uL (ref 0.0–0.7)
Eosinophils Relative: 5.9 % — ABNORMAL HIGH (ref 0.0–5.0)
HCT: 45.4 % (ref 36.0–46.0)
Hemoglobin: 15 g/dL (ref 12.0–15.0)
Lymphocytes Relative: 17.9 % (ref 12.0–46.0)
Lymphs Abs: 1.7 K/uL (ref 0.7–4.0)
MCHC: 33 g/dL (ref 30.0–36.0)
MCV: 84.7 fl (ref 78.0–100.0)
Monocytes Absolute: 0.9 K/uL (ref 0.1–1.0)
Monocytes Relative: 9.3 % (ref 3.0–12.0)
Neutro Abs: 6.2 K/uL (ref 1.4–7.7)
Neutrophils Relative %: 66 % (ref 43.0–77.0)
Platelets: 266 K/uL (ref 150.0–400.0)
RBC: 5.36 Mil/uL — ABNORMAL HIGH (ref 3.87–5.11)
RDW: 16.6 % — ABNORMAL HIGH (ref 11.5–15.5)
WBC: 9.3 K/uL (ref 4.0–10.5)

## 2024-01-29 LAB — COMPREHENSIVE METABOLIC PANEL WITH GFR
ALT: 53 U/L — ABNORMAL HIGH (ref 0–35)
AST: 42 U/L — ABNORMAL HIGH (ref 0–37)
Albumin: 4.6 g/dL (ref 3.5–5.2)
Alkaline Phosphatase: 112 U/L (ref 39–117)
BUN: 23 mg/dL (ref 6–23)
CO2: 25 meq/L (ref 19–32)
Calcium: 10.1 mg/dL (ref 8.4–10.5)
Chloride: 101 meq/L (ref 96–112)
Creatinine, Ser: 1.02 mg/dL (ref 0.40–1.20)
GFR: 50.79 mL/min — ABNORMAL LOW (ref >60.00)
Glucose, Bld: 139 mg/dL — ABNORMAL HIGH (ref 70–99)
Potassium: 4 meq/L (ref 3.5–5.1)
Sodium: 139 meq/L (ref 135–145)
Total Bilirubin: 0.6 mg/dL (ref 0.2–1.2)
Total Protein: 7.7 g/dL (ref 6.0–8.3)

## 2024-01-29 LAB — TSH: TSH: 0.61 u[IU]/mL (ref 0.35–5.50)

## 2024-01-29 NOTE — Progress Notes (Signed)
 Established Patient Office Visit  Subjective   Patient ID: [name], female    DOB: Jan 16, 1941  Age: 83 y.o. MRN: [id number]  Chief Complaint  Patient presents with   Medical Management of Chronic Issues    HPI   [name] is seen today accompanied by her husband with concerns over 1 week history of fatigue and decreased appetite.  Her weight is down 11 pounds from last visit which was prior to her knee replacement surgery in March.  She did well with the surgery.  Very limited history from patient secondary to her dementia.  History mostly provided by husband.  Husband states last week in particular she was very fatigued and did not get out of bed much.  Other than poor appetite no real specific symptoms.  They are not aware of any recent fever, nausea, vomiting, diarrhea, abdominal pain, cough, chest pain, dyspnea, abdominal pain, or dysuria.  Her chronic medications were reviewed and include amlodipine , atorvastatin , Aricept , Nexium , Lasix , levothyroxine , losartan /HCTZ, metoprolol , potassium replacement.  No new medications.  No reported recent falls or head injury.  No headaches.  Past Medical History:  Diagnosis Date   Arthritis    knees   Atrophic vaginitis    Cancer (HCC)    breast cancer - left   Diverticulosis    Endometriosis    Family history of breast cancer    Family history of colon cancer    Heart murmur    as a child only   History of radiation therapy 01/30/16- 03/02/16   Left Breast 50 Gy in 25 fractions.    Hyperlipidemia    Hypertension    Internal hemorrhoids    Osteopenia    Osteoporosis    Personal history of chemotherapy    Personal history of radiation therapy    Rheumatic fever    age 19   Thyroid  disease    Hypothyroid   Ulcer    UTI (lower urinary tract infection)    Past Surgical History:  Procedure Laterality Date   APPENDECTOMY     BIOPSY  08/23/2022   Procedure: BIOPSY;  Surgeon: Albertus Gordy HERO, MD;  Location: [facility]  ENDOSCOPY;  Service: Gastroenterology;;   BREAST BIOPSY Left 08/19/2015   BREAST LUMPECTOMY Left 09/08/2015   BREAST LUMPECTOMY WITH RADIOACTIVE SEED AND SENTINEL LYMPH NODE BIOPSY Left 09/09/2015   Procedure: LEFT BREAST LUMPECTOMY WITH RADIOACTIVE SEED AND SENTINEL LYMPH NODE BIOPSY;  Surgeon: Krystal Russell, MD;  Location: [facility] OR;  Service: General;  Laterality: Left;   COLONOSCOPY     COLONOSCOPY N/A 08/23/2022   Procedure: COLONOSCOPY;  Surgeon: Albertus Gordy HERO, MD;  Location: [facility] ENDOSCOPY;  Service: Gastroenterology;  Laterality: N/A;   DILATATION & CURETTAGE/HYSTEROSCOPY WITH MYOSURE N/A 10/09/2016   Procedure: DILATATION & CURETTAGE/HYSTEROSCOPY WITH MYOSURE;  Surgeon: Lavoie, Marie-Lyne, MD;  Location: [facility] ORS;  Service: Gynecology;  Laterality: N/A;  requesting 7:30am in our block  requests one hour   DILATATION & CURETTAGE/HYSTEROSCOPY WITH MYOSURE N/A 03/19/2022   Procedure: DILATATION & CURETTAGE/HYSTEROSCOPY;  Surgeon: Jannis Kate Norris, MD;  Location: [facility];  Service: Gynecology;  Laterality: N/A;   ESOPHAGOGASTRODUODENOSCOPY (EGD) WITH PROPOFOL  N/A 08/23/2022   Procedure: ESOPHAGOGASTRODUODENOSCOPY (EGD) WITH PROPOFOL ;  Surgeon: Albertus Gordy HERO, MD;  Location: [facility] ENDOSCOPY;  Service: Gastroenterology;  Laterality: N/A;   ESOPHAGOGASTRODUODENOSCOPY ENDOSCOPY     IR GENERIC HISTORICAL  12/05/2015   IR CV LINE INJECTION 12/05/2015 Rome Hall, MD [facility]-INTERV RAD   LAPAROSCOPIC ENDOMETRIOSIS FULGURATION  [phone]  PELVIC LAPAROSCOPY     PORTACATH PLACEMENT N/A 10/04/2015   Procedure: INSERTION PORT-A-CATH WITH ULTRASOUND;  Surgeon: Krystal Russell, MD;  Location: [facility] OR;  Service: General;  Laterality: N/A;   portacath removal     TONSILLECTOMY     TOTAL KNEE ARTHROPLASTY Left 07/08/2023   Procedure: ARTHROPLASTY, KNEE, TOTAL;  Surgeon: Melodi Lerner, MD;  Location: [facility] ORS;  Service: Orthopedics;  Laterality: Left;   ULNAR NERVE REPAIR  2010    reports that she has never  smoked. She has never used smokeless tobacco. She reports that she does not drink alcohol and does not use drugs. family history includes Breast cancer in her mother and paternal grandmother; Colon cancer in her father; Diabetes in her paternal grandmother; Heart disease in her father and maternal grandmother; Hypertension in her mother; Stroke in her father. Allergies  Allergen Reactions   Ferrlecit [Na Ferric Gluc Cplx In Sucrose] Shortness Of Breath and Other (See Comments)    Made the hands and feet feel odd, also   Nitrofurantoin Nausea And Vomiting and Other (See Comments)    GI upset   Sulfamethoxazole-Trimethoprim Nausea And Vomiting and Other (See Comments)    GI upset    Review of Systems  Constitutional:  Positive for malaise/fatigue and weight loss. Negative for chills and fever.  Respiratory:  Negative for shortness of breath.   Cardiovascular:  Negative for chest pain.  Gastrointestinal:  Negative for abdominal pain, blood in stool, diarrhea, melena, nausea and vomiting.  Genitourinary:  Negative for dysuria and hematuria.  Neurological:  Negative for focal weakness and headaches.      Objective:     BP 134/60   Pulse (!) 104   Temp 97.6 F (36.4 C) (Oral)   Wt 159 lb 9.6 oz (72.4 kg)   SpO2 94%   BMI 31.17 kg/m  BP Readings from Last 3 Encounters:  01/29/24 134/60  08/20/23 (!) 110/47  07/10/23 (!) 117/44   Wt Readings from Last 3 Encounters:  01/29/24 159 lb 9.6 oz (72.4 kg)  08/20/23 161 lb 6.4 oz (73.2 kg)  07/08/23 170 lb 9.6 oz (77.4 kg)      Physical Exam Vitals reviewed.  Constitutional:      General: She is not in acute distress.    Appearance: She is not ill-appearing.  Cardiovascular:     Rate and Rhythm: Normal rate and regular rhythm.  Pulmonary:     Effort: Pulmonary effort is normal.     Breath sounds: Normal breath sounds. No wheezing or rales.  Abdominal:     General: There is no distension.     Palpations: Abdomen is soft.      Tenderness: There is no abdominal tenderness.  Musculoskeletal:     Right lower leg: No edema.     Left lower leg: No edema.  Skin:    Findings: No rash.  Neurological:     General: No focal deficit present.     Mental Status: She is alert.      No results found for any visits on 01/29/24.    The ASCVD Risk score (Arnett DK, et al., 2019) failed to calculate for the following reasons:   The 2019 ASCVD risk score is only valid for ages 39 to 58    Assessment & Plan:   Problem List Items Addressed This Visit       Unprioritized   Hypothyroidism   Relevant Orders   TSH   Other Visit Diagnoses  Fatigue, unspecified type    -  Primary   Relevant Orders   CBC with Differential/Platelet   CMP   Urinalysis w microscopic + reflex cultur     Weight loss       Relevant Orders   TSH     83 year old female with chronic problems including hypertension, heart failure with preserved ejection fraction, past history of GI bleed, dementia, hypothyroidism, osteoarthritis, hypertension, history of breast cancer, hyperlipidemia.  She is seen today with nonspecific fatigue and decreased appetite over the past week.  History is very limited from patient secondary to her dementia.  Patient nontoxic in appearance.  Etiology of symptoms unclear.  Check further labs with urinalysis, CBC, CMP, TSH  Follow-up immediately for any specific symptoms such as fever.  Apparently she did eat a good breakfast and lunch earlier today so hopefully symptoms last week were transient in terms of decreased appetite and fatigue  No follow-ups on file.    Wolm Scarlet, MD

## 2024-01-30 ENCOUNTER — Ambulatory Visit: Payer: Self-pay | Admitting: Family Medicine

## 2024-01-31 MED ORDER — LEVOTHYROXINE SODIUM 25 MCG PO TABS: 25.0000 ug | ORAL_TABLET | Freq: Every day | ORAL | 0 refills | Status: DC

## 2024-02-27 ENCOUNTER — Other Ambulatory Visit: Payer: Self-pay | Admitting: Family Medicine

## 2024-02-27 DIAGNOSIS — I5033 Acute on chronic diastolic (congestive) heart failure: Secondary | ICD-10-CM

## 2024-04-18 ENCOUNTER — Encounter (HOSPITAL_BASED_OUTPATIENT_CLINIC_OR_DEPARTMENT_OTHER): Payer: Self-pay

## 2024-04-18 ENCOUNTER — Emergency Department (HOSPITAL_BASED_OUTPATIENT_CLINIC_OR_DEPARTMENT_OTHER)
Admission: EM | Admit: 2024-04-18 | Discharge: 2024-04-18 | Disposition: A | Discharge status: Home / Self Care | Attending: Emergency Medicine | Admitting: Emergency Medicine

## 2024-04-18 ENCOUNTER — Emergency Department (HOSPITAL_BASED_OUTPATIENT_CLINIC_OR_DEPARTMENT_OTHER)

## 2024-04-18 ENCOUNTER — Other Ambulatory Visit: Payer: Self-pay

## 2024-04-18 DIAGNOSIS — I509 Heart failure, unspecified: Secondary | ICD-10-CM | POA: Insufficient documentation

## 2024-04-18 DIAGNOSIS — R109 Unspecified abdominal pain: Secondary | ICD-10-CM

## 2024-04-18 DIAGNOSIS — I130 Hypertensive heart and chronic kidney disease with heart failure and stage 1 through stage 4 chronic kidney disease, or unspecified chronic kidney disease: Secondary | ICD-10-CM | POA: Diagnosis not present

## 2024-04-18 DIAGNOSIS — Z79899 Other long term (current) drug therapy: Secondary | ICD-10-CM | POA: Diagnosis not present

## 2024-04-18 DIAGNOSIS — N189 Chronic kidney disease, unspecified: Secondary | ICD-10-CM | POA: Insufficient documentation

## 2024-04-18 DIAGNOSIS — R1013 Epigastric pain: Secondary | ICD-10-CM | POA: Diagnosis present

## 2024-04-18 DIAGNOSIS — Z853 Personal history of malignant neoplasm of breast: Secondary | ICD-10-CM | POA: Diagnosis not present

## 2024-04-18 LAB — URINALYSIS, ROUTINE W REFLEX MICROSCOPIC
Bilirubin Urine: NEGATIVE
Glucose, UA: NEGATIVE mg/dL
Ketones, ur: NEGATIVE mg/dL
Nitrite: NEGATIVE
Protein, ur: NEGATIVE mg/dL
Specific Gravity, Urine: 1.01 (ref 1.005–1.030)
Squamous Epithelial / HPF: >50 /HPF (ref 0–5)
pH: 5.5 (ref 5.0–8.0)

## 2024-04-18 LAB — CBC
HCT: 39.9 % (ref 36.0–46.0)
Hemoglobin: 13.1 g/dL (ref 12.0–15.0)
MCH: 28.3 pg (ref 26.0–34.0)
MCHC: 32.8 g/dL (ref 30.0–36.0)
MCV: 86.2 fL (ref 80.0–100.0)
Platelets: 208 K/uL (ref 150–400)
RBC: 4.63 MIL/uL (ref 3.87–5.11)
RDW: 14.1 % (ref 11.5–15.5)
WBC: 8.6 K/uL (ref 4.0–10.5)
nRBC: 0 % (ref 0.0–0.2)

## 2024-04-18 LAB — LIPASE, BLOOD: Lipase: 53 U/L — ABNORMAL HIGH (ref 11–51)

## 2024-04-18 LAB — TROPONIN T, HIGH SENSITIVITY: Troponin T High Sensitivity: <15 ng/L (ref 0–19)

## 2024-04-18 LAB — COMPREHENSIVE METABOLIC PANEL WITH GFR
ALT: 18 U/L (ref 0–44)
AST: 18 U/L (ref 15–41)
Albumin: 4.3 g/dL (ref 3.5–5.0)
Alkaline Phosphatase: 101 U/L (ref 38–126)
Anion gap: 12 (ref 5–15)
BUN: 30 mg/dL — ABNORMAL HIGH (ref 8–23)
CO2: 27 mmol/L (ref 22–32)
Calcium: 10.3 mg/dL (ref 8.9–10.3)
Chloride: 103 mmol/L (ref 98–111)
Creatinine, Ser: 0.92 mg/dL (ref 0.44–1.00)
GFR, Estimated: >60 mL/min
Glucose, Bld: 110 mg/dL — ABNORMAL HIGH (ref 70–99)
Potassium: 3.8 mmol/L (ref 3.5–5.1)
Sodium: 142 mmol/L (ref 135–145)
Total Bilirubin: 0.2 mg/dL (ref 0.0–1.2)
Total Protein: 7.1 g/dL (ref 6.5–8.1)

## 2024-04-18 MED ORDER — LACTATED RINGERS IV BOLUS
500.0000 mL | Freq: Once | INTRAVENOUS | Status: AC
  Administered 2024-04-18: 500 mL via INTRAVENOUS

## 2024-04-18 MED ORDER — OMEPRAZOLE 20 MG PO CPDR: 20.0000 mg | DELAYED_RELEASE_CAPSULE | Freq: Every day | ORAL | 0 refills | Status: AC

## 2024-04-18 MED ORDER — LIDOCAINE VISCOUS HCL 2 % MT SOLN
15.0000 mL | Freq: Once | OROMUCOSAL | Status: AC
  Administered 2024-04-18: 15 mL via ORAL
  Filled 2024-04-18: qty 15

## 2024-04-18 MED ORDER — CEPHALEXIN 500 MG PO CAPS: 500.0000 mg | ORAL_CAPSULE | Freq: Two times a day (BID) | ORAL | 0 refills | Status: AC

## 2024-04-18 MED ORDER — IOHEXOL 300 MG/ML  SOLN
100.0000 mL | Freq: Once | INTRAMUSCULAR | Status: AC | PRN
  Administered 2024-04-18: 100 mL via INTRAVENOUS

## 2024-04-18 MED ORDER — FAMOTIDINE IN NACL 20-0.9 MG/50ML-% IV SOLN
20.0000 mg | Freq: Once | INTRAVENOUS | Status: AC
  Administered 2024-04-18: 20 mg via INTRAVENOUS
  Filled 2024-04-18: qty 50

## 2024-04-18 MED ORDER — ALUM & MAG HYDROXIDE-SIMETH 200-200-20 MG/5ML PO SUSP
30.0000 mL | Freq: Once | ORAL | Status: AC
  Administered 2024-04-18: 30 mL via ORAL
  Filled 2024-04-18: qty 30

## 2024-04-18 NOTE — ED Notes (Signed)
 Reviewed discharge instructions, medications, and home care with pt. Pt verbalized understanding and had no further questions. Pt exited ED without complications.

## 2024-04-18 NOTE — Discharge Instructions (Addendum)
 Thank you for visiting the Emergency Department today. It was a pleasure to be part of your healthcare team.   Your were seen today for abdominal pain, and your workup was overall reassuring -  As discussed, you have been referred to gastroenterology to follow-up on your epigastric pain, and you should follow-up with your primary care provider as well.  You have been scribed an antibiotic treatment regimen for UTI and omeprazole  for ongoing epigastric discomfort, and you should take your medications as directed. If you have any questions about your medicines, please call your pharmacy or healthcare provider.  At home, rest, hydrate, and resume normal diet as tolerated.  It is important to watch for warning signs such as worsening pain, fever, trouble breathing, or chest pain. If any of these happen, return to the Emergency Department or call 911.  Thank you for trusting us  with your health.

## 2024-04-18 NOTE — ED Provider Notes (Signed)
 " [place] EMERGENCY DEPARTMENT AT [facility] Provider Note   CSN: [id number] Arrival date & time: 04/18/24  9083     Patient presents with: Abdominal Pain   [name] is a 84 y.o. female with a past medical history of hypertension, peptic ulcer, breast cancer, heart failure, chronic kidney disease, and mild cognitive impairment who presents with abdominal pain ongoing for the past couple of months.  Patient states that she has been having symptoms which she describes as similar to when she had a peptic ulcer-patient states that the pain became unbearable last night, to which she thought she was having a heart attack.  Patient states that the pain is notably worse when lying flat, located in the epigastric region, and sharp in nature.  Patient states that this episode of pain lasted around 3 hours.  Patient states at this time she is asymptomatic however concerned and wanted to be evaluated due to the level of pain she was experiencing. She denies fever, chills, chest pain, shortness of breath, persistent vomiting, hematemesis, melena, hematochezia, dysuria, hematuria.  There is no history of recent abdominal trauma. Prior abdominal surgery/history of abdominal surgery includes recurrent peptic ulcers with her last EGD in 2024.  The patient reports no recent travel, antibiotic use, or known sick contacts.  Oral intake has been as normal and bowel movements are regular, with last bowel movement yesterday.  No alleviating or exacerbating factors were clearly identified.  The patient is in no acute distress.    Abdominal Pain      Prior to Admission medications  Medication Sig Start Date End Date Taking? Authorizing Provider  cephALEXin  (KEFLEX ) 500 MG capsule Take 1 capsule (500 mg total) by mouth 2 (two) times daily for 7 days. 04/18/24 04/25/24 Yes Addilee Neu L, PA  omeprazole  (PRILOSEC) 20 MG capsule Take 1 capsule (20 mg total) by mouth daily. 04/18/24 05/02/24 Yes Keaunna Skipper,  Davidmichael Zarazua L, PA  amLODipine  (NORVASC ) 5 MG tablet TAKE 1 TABLET BY MOUTH ONCE DAILY IN THE MORNING 01/28/24   Burchette, Wolm ORN, MD  atorvastatin  (LIPITOR) 20 MG tablet Take 1 tablet by mouth once daily 11/18/23   Burchette, Wolm ORN, MD  donepezil  (ARICEPT ) 5 MG tablet TAKE 1 TABLET BY MOUTH ONCE DAILY AT BEDTIME 10/07/23   Camara, Amadou, MD  ferrous sulfate 324 MG TBEC Take 324 mg by mouth daily with breakfast.    [provider]  furosemide  (LASIX ) 20 MG tablet TAKE 1 TABLET BY MOUTH ONCE DAILY IN THE MORNING 02/27/24   Burchette, Wolm ORN, MD  levothyroxine  (SYNTHROID ) 25 MCG tablet Take 1 tablet (25 mcg total) by mouth daily. 01/31/24   Burchette, Wolm ORN, MD  losartan -hydrochlorothiazide  (HYZAAR) 100-12.5 MG tablet TAKE 1 TABLET BY MOUTH ONCE DAILY IN THE MORNING 09/10/23   Burchette, Wolm ORN, MD  methocarbamol  (ROBAXIN ) 500 MG tablet Take 1 tablet (500 mg total) by mouth every 6 (six) hours as needed for muscle spasms. 07/10/23   Edmisten, Roxie CROME, PA  metoprolol  tartrate (LOPRESSOR ) 25 MG tablet TAKE 1 TABLET BY MOUTH IN THE MORNING AND AT BEDTIME 11/18/23   Burchette, Wolm ORN, MD  ondansetron  (ZOFRAN ) 4 MG tablet Take 1 tablet (4 mg total) by mouth every 6 (six) hours as needed for nausea. 07/10/23   Edmisten, Roxie CROME, PA  oxyCODONE  (OXY IR/ROXICODONE ) 5 MG immediate release tablet Take 1-2 tablets (5-10 mg total) by mouth every 8 (eight) hours as needed for severe pain (pain score 7-10). 07/10/23  Edmisten, Kristie L, PA  potassium chloride  (KLOR-CON ) 10 MEQ tablet Take 1 tablet by mouth twice daily 11/18/23   Burchette, Wolm ORN, MD  traMADol  (ULTRAM ) 50 MG tablet Take 1-2 tablets (50-100 mg total) by mouth every 6 (six) hours as needed for moderate pain (pain score 4-6). 07/10/23   Edmisten, Kristie L, PA  TYLENOL  325 MG tablet Take 325-650 mg by mouth every 6 (six) hours as needed for mild pain or headache.    [provider]    Allergies: Ferrlecit [na ferric gluc cplx in  sucrose], Nitrofurantoin, and Sulfamethoxazole-trimethoprim    Review of Systems  Gastrointestinal:  Positive for abdominal pain.    Updated Vital Signs BP 135/63   Pulse 83   Temp 97.8 F (36.6 C) (Oral)   Resp (!) 23   SpO2 99%   Physical Exam Vitals and nursing note reviewed.  Constitutional:      General: She is not in acute distress.    Appearance: Normal appearance.  HENT:     Head: Normocephalic and atraumatic.  Eyes:     Extraocular Movements: Extraocular movements intact.     Conjunctiva/sclera: Conjunctivae normal.     Pupils: Pupils are equal, round, and reactive to light.  Cardiovascular:     Rate and Rhythm: Normal rate and regular rhythm.     Pulses: Normal pulses.          Radial pulses are 2+ on the right side and 2+ on the left side.  Pulmonary:     Effort: Pulmonary effort is normal. No respiratory distress.     Breath sounds: Normal breath sounds.     Comments: Patient has no difficulty speaking in complete sentences. Abdominal:     General: Abdomen is flat.     Palpations: Abdomen is soft.     Tenderness: There is abdominal tenderness in the epigastric area. There is no right CVA tenderness or left CVA tenderness.     Comments: Moderate tenderness to palpation to epigastric region.  No guarding, rebound, or distention.  No hernias appreciated on exam.  Musculoskeletal:        General: Normal range of motion.     Cervical back: Normal range of motion.     Right lower leg: No edema.     Left lower leg: No edema.  Skin:    General: Skin is warm and dry.     Capillary Refill: Capillary refill takes less than 2 seconds.  Neurological:     General: No focal deficit present.     Mental Status: She is alert. Mental status is at baseline.  Psychiatric:        Mood and Affect: Mood normal.     (all labs ordered are listed, but only abnormal results are displayed) Labs Reviewed  LIPASE, BLOOD - Abnormal; Notable for the following components:       Result Value   Lipase 53 (*)    All other components within normal limits  COMPREHENSIVE METABOLIC PANEL WITH GFR - Abnormal; Notable for the following components:   Glucose, Bld 110 (*)    BUN 30 (*)    All other components within normal limits  URINALYSIS, ROUTINE W REFLEX MICROSCOPIC - Abnormal; Notable for the following components:   APPearance CLOUDY (*)    Hgb urine dipstick TRACE (*)    Leukocytes,Ua MODERATE (*)    Bacteria, UA RARE (*)    Non Squamous Epithelial 0-5 (*)    All other components within normal limits  URINE CULTURE  CBC  TROPONIN T, HIGH SENSITIVITY    EKG: EKG Interpretation Date/Time:  Saturday April 18 2024 10:20:20 EST Ventricular Rate:  71 PR Interval:  192 QRS Duration:  91 QT Interval:  423 QTC Calculation: 460 R Axis:   9  Text Interpretation: Sinus rhythm Low voltage, precordial leads No significant change since last tracing Confirmed by Dreama Longs ([id number]) on 04/18/2024 12:52:47 PM  Radiology: No results found.   Procedures   Medications Ordered in the ED  alum & mag hydroxide-simeth (MAALOX/MYLANTA) 200-200-20 MG/5ML suspension 30 mL (30 mLs Oral Given 04/18/24 1024)    And  lidocaine  (XYLOCAINE ) 2 % viscous mouth solution 15 mL (15 mLs Oral Given 04/18/24 1024)  famotidine  (PEPCID ) IVPB 20 mg premix (0 mg Intravenous Stopped 04/18/24 1101)  lactated ringers  bolus 500 mL (0 mLs Intravenous Stopped 04/18/24 1210)  iohexol  (OMNIPAQUE ) 300 MG/ML solution 100 mL (100 mLs Intravenous Contrast Given 04/18/24 1054)                                 Medical Decision Making Amount and/or Complexity of Data Reviewed Labs: ordered. Decision-making details documented in ED Course. Radiology: ordered.  Risk OTC drugs. Prescription drug management.   Patient presents to the ED for: Abdominal pain This involves an extensive number of treatment options Differential diagnosis includes:  Gastritis/peptic ulcer Pancreatitis Cardiac  etiology UTI Co-morbid conditions: Peptic ulcer disease, CKD, HF, HTN  Additional history/records obtained and reviewed: Additional history obtained from  family -patient's son present for visit is good historian External records from outside source obtained and reviewed including review of current treatment regimen for patient's gastric ulcer -patient currently not on any treatment.  Clinical Course as of 04/22/24 0727  Sat Apr 18, 2024  1006 Temp: 97.8 F (36.6 C) Afebrile, vital stable, patient in no acute distress [ML]  1007 CBC No acute findings [ML]  1007 Urinalysis, Routine w reflex microscopic -Urine, Clean Catch(!) Leuk +, WBC [ML]  1038 Lipase, blood(!) Mildly elevated 53 [ML]  1038 Comprehensive metabolic panel(!) No acute findings  [ML]  1039 EKG 12-Lead Sinus rhythm [ML]  1053 Troponin T, High Sensitivity Negative  [ML]  1135 CT ABDOMEN PELVIS W CONTRAST No acute findings [ML]  1200 GI cocktail, famotidine , and LR bolus given for symptomatic relief -well tolerated  [ML]    Clinical Course User Index [ML] Willma Duwaine CROME, PA    Data Reviewed / Actions Taken: Labs ordered/reviewed with my independent interpretation in ED course above. Imaging ordered/reviewed with my independent interpretation in ED course above. I agree with the radiologists interpretation.  EKG ordered/reviewed with my independent interpretation in ED course above.   Management / Treatments: See ED course above for medications, treatments administered, and clinical rationale.   Reevaluation of the patient after these medicines showed that the patient improved. I have reviewed the patients home medicines and have made adjustments as needed  ED Course / Reassessments: Problem List: Abdominal pain 84 year old female presented for abdominal pain. Initial assessment included history, physical exam, and review of prior medical records.  Patient's urinalysis returned for possible cystitis-this was  confirmed with patient relating symptoms of frequency over the last few days.  Additional laboratory evaluation was without acute finding. Imaging including CT abdomen demonstrated no acute intra-abdominal pathology.  Based on the patient's reassuring vital signs, benign abdominal exam, and unremarkable diagnostic workup, there is low clinical suspicion for acute surgical  abdomen, bowel obstruction, appendicitis, cholecystitis, pancreatitis, ischemic bowel, or other emergent intra-abdominal process.  The patient was treated symptomatically with GI cocktail, famotidine , and LR bolus with improvement of symptoms during the ED course.  The patient remained clinically stable and deemed appropriate for outpatient management.  Patient's discomfort likely related to gastritis as patient has a history of GERD and is not medicated for same.  Patient's current UTI could be a contributing factor to patient's discomfort.  Discharge instructions included strict return precautions for worsening or localized abdominal pain, persistent vomiting, fever, inability to tolerate oral intake, syncope, or new concerning symptoms.  Patient was advised to follow-up with primary care and gastroenterology and to continue supportive care at home.   Patient response: Improved with ED management Serial reassessments performed: Yes    Disposition: Disposition: Discharge with close follow-up with GI and PCP for further evaluation of same Rationale for disposition: Stable for discharge The disposition plan and rationale were discussed with the patient at the bedside, all questions were addressed, and the patient demonstrated understanding.  This note was produced using Electronics Engineer. While I have reviewed and verified all clinical information, transcription errors may remain.      Final diagnoses:  Abdominal pain, unspecified abdominal location    ED Discharge Orders          Ordered    omeprazole  (PRILOSEC)  20 MG capsule  Daily        04/18/24 1336    cephALEXin  (KEFLEX ) 500 MG capsule  2 times daily        04/18/24 1336               Willma Duwaine CROME, [place] 04/22/24 [id number]    Dreama Longs, MD 04/23/24 1216  "

## 2024-04-18 NOTE — ED Triage Notes (Signed)
 She c/o epigastric discomfort all night last night. She tells us  she has hx of ulcers. She is ambulatory and in no distress.

## 2024-04-19 LAB — URINE CULTURE: Culture: NO GROWTH

## 2024-04-23 ENCOUNTER — Other Ambulatory Visit: Payer: Self-pay | Admitting: Family Medicine

## 2024-04-28 ENCOUNTER — Other Ambulatory Visit: Payer: Self-pay | Admitting: Family Medicine

## 2024-05-11 ENCOUNTER — Other Ambulatory Visit: Payer: Self-pay | Admitting: Family Medicine

## 2024-05-14 ENCOUNTER — Other Ambulatory Visit: Payer: Self-pay | Admitting: Family Medicine

## 2024-05-22 ENCOUNTER — Other Ambulatory Visit: Payer: Self-pay

## 2024-08-20 ENCOUNTER — Ambulatory Visit: Admitting: Hematology and Oncology
